# Patient Record
Sex: Male | Born: 1965 | Race: White | Hispanic: No | Marital: Single | Smoking: Current every day smoker
Health system: Southern US, Community
[De-identification: ages and names within clinical notes are randomized; demographics above are authoritative.]

## PROBLEM LIST (undated history)

## (undated) DIAGNOSIS — F329 Major depressive disorder, single episode, unspecified: Secondary | ICD-10-CM

## (undated) DIAGNOSIS — I10 Essential (primary) hypertension: Secondary | ICD-10-CM

## (undated) DIAGNOSIS — K859 Acute pancreatitis without necrosis or infection, unspecified: Secondary | ICD-10-CM

## (undated) DIAGNOSIS — K219 Gastro-esophageal reflux disease without esophagitis: Secondary | ICD-10-CM

## (undated) DIAGNOSIS — F32A Depression, unspecified: Secondary | ICD-10-CM

## (undated) DIAGNOSIS — E46 Unspecified protein-calorie malnutrition: Secondary | ICD-10-CM

## (undated) DIAGNOSIS — I2510 Atherosclerotic heart disease of native coronary artery without angina pectoris: Secondary | ICD-10-CM

## (undated) DIAGNOSIS — R569 Unspecified convulsions: Secondary | ICD-10-CM

## (undated) DIAGNOSIS — G8929 Other chronic pain: Secondary | ICD-10-CM

## (undated) DIAGNOSIS — I469 Cardiac arrest, cause unspecified: Secondary | ICD-10-CM

## (undated) DIAGNOSIS — F209 Schizophrenia, unspecified: Secondary | ICD-10-CM

## (undated) DIAGNOSIS — F1010 Alcohol abuse, uncomplicated: Secondary | ICD-10-CM

---

## 1997-07-12 ENCOUNTER — Emergency Department (HOSPITAL_COMMUNITY): Admission: EM | Admit: 1997-07-12 | Discharge: 1997-07-12 | Payer: Self-pay | Admitting: Emergency Medicine

## 1997-07-15 ENCOUNTER — Emergency Department (HOSPITAL_COMMUNITY): Admission: EM | Admit: 1997-07-15 | Discharge: 1997-07-15 | Payer: Self-pay | Admitting: Emergency Medicine

## 1997-08-20 ENCOUNTER — Encounter: Payer: Self-pay | Admitting: Emergency Medicine

## 1997-08-20 ENCOUNTER — Emergency Department (HOSPITAL_COMMUNITY): Admission: EM | Admit: 1997-08-20 | Discharge: 1997-08-20 | Payer: Self-pay | Admitting: Emergency Medicine

## 1997-11-19 ENCOUNTER — Emergency Department (HOSPITAL_COMMUNITY): Admission: EM | Admit: 1997-11-19 | Discharge: 1997-11-19 | Payer: Self-pay | Admitting: Emergency Medicine

## 1997-12-27 ENCOUNTER — Emergency Department (HOSPITAL_COMMUNITY): Admission: EM | Admit: 1997-12-27 | Discharge: 1997-12-27 | Payer: Self-pay | Admitting: *Deleted

## 2001-03-30 ENCOUNTER — Emergency Department (HOSPITAL_COMMUNITY): Admission: EM | Admit: 2001-03-30 | Discharge: 2001-03-30 | Payer: Self-pay | Admitting: Emergency Medicine

## 2002-02-27 ENCOUNTER — Emergency Department (HOSPITAL_COMMUNITY): Admission: EM | Admit: 2002-02-27 | Discharge: 2002-02-27 | Payer: Self-pay | Admitting: Emergency Medicine

## 2004-02-14 ENCOUNTER — Encounter: Payer: Self-pay | Admitting: Emergency Medicine

## 2004-02-15 ENCOUNTER — Inpatient Hospital Stay (HOSPITAL_COMMUNITY): Admission: EM | Admit: 2004-02-15 | Discharge: 2004-02-22 | Payer: Self-pay | Admitting: Psychiatry

## 2004-02-15 ENCOUNTER — Ambulatory Visit: Payer: Self-pay | Admitting: Psychiatry

## 2004-04-15 ENCOUNTER — Ambulatory Visit: Payer: Self-pay | Admitting: Family Medicine

## 2005-02-01 ENCOUNTER — Ambulatory Visit: Payer: Self-pay | Admitting: Family Medicine

## 2005-03-15 ENCOUNTER — Ambulatory Visit: Payer: Self-pay | Admitting: Family Medicine

## 2005-04-08 ENCOUNTER — Ambulatory Visit: Payer: Self-pay | Admitting: Psychiatry

## 2005-04-08 ENCOUNTER — Inpatient Hospital Stay (HOSPITAL_COMMUNITY): Admission: RE | Admit: 2005-04-08 | Discharge: 2005-04-12 | Payer: Self-pay | Admitting: Psychiatry

## 2005-06-17 ENCOUNTER — Ambulatory Visit: Payer: Self-pay | Admitting: Family Medicine

## 2005-08-24 ENCOUNTER — Ambulatory Visit: Payer: Self-pay | Admitting: Internal Medicine

## 2005-11-02 ENCOUNTER — Ambulatory Visit: Payer: Self-pay | Admitting: Internal Medicine

## 2005-12-13 ENCOUNTER — Emergency Department (HOSPITAL_COMMUNITY): Admission: EM | Admit: 2005-12-13 | Discharge: 2005-12-13 | Payer: Self-pay | Admitting: Emergency Medicine

## 2005-12-21 ENCOUNTER — Ambulatory Visit: Payer: Self-pay | Admitting: Family Medicine

## 2006-04-12 ENCOUNTER — Emergency Department (HOSPITAL_COMMUNITY): Admission: EM | Admit: 2006-04-12 | Discharge: 2006-04-13 | Payer: Self-pay | Admitting: Emergency Medicine

## 2006-04-20 ENCOUNTER — Ambulatory Visit: Payer: Self-pay | Admitting: Family Medicine

## 2006-08-08 ENCOUNTER — Ambulatory Visit: Payer: Self-pay | Admitting: Family Medicine

## 2006-09-09 ENCOUNTER — Inpatient Hospital Stay (HOSPITAL_COMMUNITY): Admission: RE | Admit: 2006-09-09 | Discharge: 2006-09-12 | Payer: Self-pay | Admitting: Psychiatry

## 2006-09-09 ENCOUNTER — Ambulatory Visit: Payer: Self-pay | Admitting: Psychiatry

## 2007-02-23 ENCOUNTER — Emergency Department (HOSPITAL_COMMUNITY): Admission: EM | Admit: 2007-02-23 | Discharge: 2007-02-24 | Payer: Self-pay | Admitting: Emergency Medicine

## 2007-10-08 ENCOUNTER — Emergency Department (HOSPITAL_COMMUNITY): Admission: EM | Admit: 2007-10-08 | Discharge: 2007-10-08 | Payer: Self-pay | Admitting: Emergency Medicine

## 2007-11-10 ENCOUNTER — Emergency Department (HOSPITAL_COMMUNITY): Admission: EM | Admit: 2007-11-10 | Discharge: 2007-11-10 | Payer: Self-pay | Admitting: Emergency Medicine

## 2007-11-19 ENCOUNTER — Emergency Department (HOSPITAL_COMMUNITY): Admission: EM | Admit: 2007-11-19 | Discharge: 2007-11-19 | Payer: Self-pay | Admitting: Emergency Medicine

## 2008-04-25 ENCOUNTER — Emergency Department (HOSPITAL_COMMUNITY): Admission: EM | Admit: 2008-04-25 | Discharge: 2008-04-25 | Payer: Self-pay | Admitting: Emergency Medicine

## 2008-04-25 ENCOUNTER — Encounter (INDEPENDENT_AMBULATORY_CARE_PROVIDER_SITE_OTHER): Payer: Self-pay | Admitting: *Deleted

## 2008-06-03 ENCOUNTER — Emergency Department (HOSPITAL_COMMUNITY): Admission: EM | Admit: 2008-06-03 | Discharge: 2008-06-03 | Payer: Self-pay | Admitting: Emergency Medicine

## 2008-06-05 DIAGNOSIS — F1910 Other psychoactive substance abuse, uncomplicated: Secondary | ICD-10-CM

## 2008-06-05 DIAGNOSIS — I10 Essential (primary) hypertension: Secondary | ICD-10-CM

## 2008-06-05 DIAGNOSIS — F1210 Cannabis abuse, uncomplicated: Secondary | ICD-10-CM | POA: Insufficient documentation

## 2008-06-05 DIAGNOSIS — F1010 Alcohol abuse, uncomplicated: Secondary | ICD-10-CM | POA: Insufficient documentation

## 2008-06-05 DIAGNOSIS — E7800 Pure hypercholesterolemia, unspecified: Secondary | ICD-10-CM | POA: Insufficient documentation

## 2008-06-05 DIAGNOSIS — J40 Bronchitis, not specified as acute or chronic: Secondary | ICD-10-CM

## 2008-06-05 DIAGNOSIS — F329 Major depressive disorder, single episode, unspecified: Secondary | ICD-10-CM

## 2008-06-05 DIAGNOSIS — K625 Hemorrhage of anus and rectum: Secondary | ICD-10-CM

## 2008-06-05 DIAGNOSIS — F200 Paranoid schizophrenia: Secondary | ICD-10-CM | POA: Insufficient documentation

## 2008-06-07 ENCOUNTER — Telehealth: Payer: Self-pay | Admitting: Gastroenterology

## 2008-06-07 ENCOUNTER — Ambulatory Visit: Payer: Self-pay | Admitting: Gastroenterology

## 2008-07-31 ENCOUNTER — Ambulatory Visit: Payer: Self-pay | Admitting: Gastroenterology

## 2008-10-18 ENCOUNTER — Telehealth: Payer: Self-pay | Admitting: Gastroenterology

## 2008-10-18 ENCOUNTER — Inpatient Hospital Stay (HOSPITAL_COMMUNITY): Admission: EM | Admit: 2008-10-18 | Discharge: 2008-10-19 | Payer: Self-pay | Admitting: Emergency Medicine

## 2008-10-19 ENCOUNTER — Telehealth: Payer: Self-pay | Admitting: Gastroenterology

## 2010-04-09 LAB — URINALYSIS, MICROSCOPIC ONLY
Bilirubin Urine: NEGATIVE
Nitrite: NEGATIVE
Protein, ur: NEGATIVE mg/dL
Specific Gravity, Urine: 1.007 (ref 1.005–1.030)
Urobilinogen, UA: 0.2 mg/dL (ref 0.0–1.0)

## 2010-04-09 LAB — CBC
HCT: 40.3 % (ref 39.0–52.0)
HCT: 41.9 % (ref 39.0–52.0)
HCT: 46.5 % (ref 39.0–52.0)
Hemoglobin: 13.9 g/dL (ref 13.0–17.0)
Hemoglobin: 14.5 g/dL (ref 13.0–17.0)
MCHC: 34.5 g/dL (ref 30.0–36.0)
MCHC: 34.7 g/dL (ref 30.0–36.0)
MCV: 94.2 fL (ref 78.0–100.0)
MCV: 93.5 fL (ref 78.0–100.0)
MCV: 94.5 fL (ref 78.0–100.0)
Platelets: 271 10*3/uL (ref 150–400)
Platelets: 293 10*3/uL (ref 150–400)
RBC: 4.28 MIL/uL (ref 4.22–5.81)
RDW: 14.5 % (ref 11.5–15.5)
RDW: 14.7 % (ref 11.5–15.5)
WBC: 6.7 10*3/uL (ref 4.0–10.5)
WBC: 9.9 10*3/uL (ref 4.0–10.5)

## 2010-04-09 LAB — CROSSMATCH: Antibody Screen: NEGATIVE

## 2010-04-09 LAB — BASIC METABOLIC PANEL
BUN: 6 mg/dL (ref 6–23)
CO2: 25 mEq/L (ref 19–32)
Calcium: 8.3 mg/dL — ABNORMAL LOW (ref 8.4–10.5)
Creatinine, Ser: 0.71 mg/dL (ref 0.4–1.5)
GFR calc Af Amer: >60 mL/min (ref >60)

## 2010-04-09 LAB — COMPREHENSIVE METABOLIC PANEL
AST: 25 U/L (ref 0–37)
Albumin: 4 g/dL (ref 3.5–5.2)
BUN: 8 mg/dL (ref 6–23)
Calcium: 9.4 mg/dL (ref 8.4–10.5)
Creatinine, Ser: 0.81 mg/dL (ref 0.4–1.5)
GFR calc Af Amer: >60 mL/min (ref >60)
Total Bilirubin: 0.4 mg/dL (ref 0.3–1.2)
Total Protein: 6.8 g/dL (ref 6.0–8.3)

## 2010-04-09 LAB — URINE CULTURE
Colony Count: NO GROWTH
Culture: NO GROWTH

## 2010-04-09 LAB — PROTIME-INR: Prothrombin Time: 11.8 seconds (ref 11.6–15.2)

## 2010-04-09 LAB — APTT: aPTT: 30 seconds (ref 24–37)

## 2010-04-09 LAB — CARDIAC PANEL(CRET KIN+CKTOT+MB+TROPI): Total CK: 128 U/L (ref 7–232)

## 2010-04-09 LAB — CK TOTAL AND CKMB (NOT AT ARMC): Relative Index: 1.4 (ref 0.0–2.5)

## 2010-04-09 LAB — DIFFERENTIAL
Basophils Absolute: 0.1 10*3/uL (ref 0.0–0.1)
Eosinophils Relative: 3 % (ref 0–5)
Lymphocytes Relative: 22 % (ref 12–46)
Lymphs Abs: 2.2 10*3/uL (ref 0.7–4.0)
Monocytes Absolute: 0.8 10*3/uL (ref 0.1–1.0)
Neutro Abs: 6.6 10*3/uL (ref 1.7–7.7)

## 2010-04-09 LAB — TSH: TSH: 1.671 u[IU]/mL (ref 0.350–4.500)

## 2010-04-09 LAB — TROPONIN I: Troponin I: 0.01 ng/mL (ref 0.00–0.06)

## 2010-04-15 LAB — CBC
HCT: 44.8 % (ref 39.0–52.0)
Hemoglobin: 15.9 g/dL (ref 13.0–17.0)
MCHC: 35.3 g/dL (ref 30.0–36.0)
MCV: 94 fL (ref 78.0–100.0)
RBC: 4.77 MIL/uL (ref 4.22–5.81)
RDW: 13.4 % (ref 11.5–15.5)

## 2010-04-15 LAB — DIFFERENTIAL
Basophils Relative: 0 % (ref 0–1)
Eosinophils Absolute: 0.2 10*3/uL (ref 0.0–0.7)
Eosinophils Relative: 4 % (ref 0–5)
Monocytes Absolute: 0.5 10*3/uL (ref 0.1–1.0)
Monocytes Relative: 8 % (ref 3–12)

## 2010-04-15 LAB — POCT I-STAT, CHEM 8
BUN: 9 mg/dL (ref 6–23)
Creatinine, Ser: 0.9 mg/dL (ref 0.4–1.5)
Glucose, Bld: 106 mg/dL — ABNORMAL HIGH (ref 70–99)
Hemoglobin: 16 g/dL (ref 13.0–17.0)
Potassium: 4.2 mEq/L (ref 3.5–5.1)
TCO2: 27 mmol/L (ref 0–100)

## 2010-04-15 LAB — TYPE AND SCREEN: Antibody Screen: NEGATIVE
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
## 2010-05-22 NOTE — Discharge Summary (Signed)
NAME:  Chase, Blackwell NO.:  [id number]   MEDICAL RECORD NO.:  [id number]          PATIENT TYPE:  IPS   LOCATION:  0407                          FACILITY:  [facility]   PHYSICIAN:  Anselm Jungling, MD  DATE OF BIRTH:  1965-05-01   DATE OF ADMISSION:  09/09/2006  DATE OF DISCHARGE:  09/12/2006                               DISCHARGE SUMMARY   IDENTIFYING DATA AND REASON FOR ADMISSION:  This was a readmission to  the [facility] for Chase Blackwell, a 45 year old single white male who had been  noncompliant with his medications for schizoaffective disorder.  He had  become symptomatic again, resulting in his referral for inpatient  hospitalization.  Please refer to the admission note for further details  pertaining to the symptoms, circumstances and history that led to his  hospitalization.  He was given initial an initial Axis I diagnosis of  schizoaffective disorder NOS.   MEDICAL AND LABORATORY:  The patient was medically and physically  assessed by the psychiatric physician's assistant.  He was continued on  his usual lisinopril, Advair, albuterol, Crestor, and omeprazole.  There  were no acute medical issues.   HOSPITAL COURSE:  The patient was admitted the adult inpatient  psychiatric service.  He presented as a well-nourished, well-developed  adult male who was generally pleasant and cooperative, but with anxious  mood.  He denied suicidal ideation, auditory and visual hallucinations.  He acknowledged having had some auditory hallucinations and some  paranoid feelings.   He was restarted on his usual psychotropic medication regimen which  included Haldol, Cogentin, Paxil, and BuSpar.  He recompensated quite  rapidly.  He tolerated his restarted medications well.  He was initially  evaluated and treated by Dr. Geoffery Lyons.  The undersigned assumed care  of the patient on the final hospital day.  On that day, the patient was  pleasant and calm in our meeting.  He had an odd  mannerism of staring  fixedly, with a wide-eyed appearance, but his speech was rational and  logical.  He acknowledged that he had had auditory hallucinations prior  to admission but indicated that those had completely abated since his  medications had been restarted.  He agreed to [place] contacting his family  regarding discharge planning.  His family was contacted and they were  comfortable with his discharge on that day.  The patient agreed to the  following aftercare plan.   AFTERCARE:  The patient was to follow-up at the [facility] with an  appointment on September 15, 2006.   DISCHARGE MEDICATIONS:  Paxil 20 mg daily, Cogentin 0.5 mg b.i.d.,  Haldol Decanoate injection every 28 days, 75 mg, last injection 8/29,  next injection October 01, 2006, lisinopril 5 mg daily, Advair 1 puff  twice daily, albuterol 2 puffs q. 4-6h p.r.n., Crestor 20 mg daily,  BuSpar 5 mg t.i.d., omeprazole 20 mg b.i.d..  The patient was instructed  to follow-up with his family doctor regarding his general health  problems and medications.   DISCHARGE DIAGNOSES:  AXIS I: Schizoaffective disorder NOS.  AXIS II: Deferred.  AXIS  III: History of hypertension, asthma, GERD.  AXIS IV: Stressors severe.  AXIS V: GAF on discharge 50.      Anselm Jungling, MD  Electronically Signed     SPB/MEDQ  D:  09/13/2006  T:  09/13/2006  Job:  [id number]

## 2010-05-22 NOTE — Discharge Summary (Signed)
NAME:  Chase Blackwell NO.:  [id number]   MEDICAL RECORD NO.:  [id number]          PATIENT TYPE:  IPS   LOCATION:  0405                          FACILITY:  [facility]   PHYSICIAN:  Syed T. Arfeen, M.D.   DATE OF BIRTH:  [birth year]   DATE OF ADMISSION:  02/15/2004  DATE OF DISCHARGE:  02/22/2004                                 DISCHARGE SUMMARY   IDENTIFYING DATA:  This is a voluntary admission of patient Chase Blackwell,  who is a 45 year old white man.  Apparently he was brought to the emergency  room at [facility] by [facility] police department.  It was reported  that he had ingested 5 Tranxene tablets at approximately 4:30 along with a  12-pack of beer that he finished at 6:30, when he walked out of the room and  told his mother that he wanted to commit suicide.  Mother called the police  and the patient was brought to the emergency room.  His urine drug screen  was positive for benzodiazepines and his alcohol level was 187.  He was  admitted voluntarily to the hospital and today he is feeling better.  The  patient desired that he wanted to discharged on his medications.   PAST PSYCHIATRIC HISTORY:  The patient has 2 hospital inpatient admissions  but he is not sure why and where, but at least one of them was at [facility].   ALCOHOL AND DRUG HISTORY:  The patient smokes 2 to 2.5 packs of cigarettes  per day and has stated that he occasionally drinks 2.5 40-ounce beers every  now and then.   PAST MEDICAL HISTORY:  The patient is followed by Dr. Hortencia Pilar and Madaline Savage at [facility].   ADMISSION MEDICATIONS:  He receives Haldol Decanoate and he stated he is due  again on February 17.  He takes Paxil 20 mg 1 tablet daily, Cogentin 1 mg  1/2 daily, and Tranxene 3.75 at 2 p.m.   ALLERGIES:  SULFA, which caused a rash.   PHYSICAL EXAMINATION:  Examination was done in ER essentially within normal  limits.   MENTAL STATUS EXAM:   The patient is alert and oriented x3.  He is somewhat  disheveled, in nightgown.  He maintained limited eye contact.  Speech is  normal rate, rhythm and tone.  He reported his mood to be anxious with  affect euthymic.  Thought processes logical, clear, rational and goal  directed.  He stated he is no longer suicidal and wanted to e discharged.  Judgment and insight limited.  He currently denies any auditory or visual  hallucinations.   ADMISSION DIAGNOSES:   AXIS I:  1.  Schizoaffective disorder, rule out bipolar disorder with psychotic      features.  2.  Rule out schizophrenia.   AXIS II:  Deferred.   AXIS III:  None.   AXIS IV:  Unknown.   AXIS V:  30.   HOSPITAL COURSE:  The patient was admitted and moved to 400 hall.  He was  restarted on his medication.  Paxil was started 20 mg daily. He was  encouraged to participate in individual, group and milieu therapy.  He  continued to be seen as anxious and he reported poor sleep.  The next day he  was more cooperative and he was able to tell that he has been on Haldol and  Cogentin so the Haldol was started as a standing 2 mg p.o. q.h.s. and  Cogentin 0.5 mg q.h.s.  More information was requested from [facility] about his medication.  He was able to get some sleep  but he still seemed very paranoid and reported that he had been  hallucinating prior to coming to the hospital.  Information was collected  from [facility] that the patient was on Haldol Decanoate and the next  scheduled injection is on February 17.  The patient responded a little  better on Haldol, but he continued to be seen paranoid mostly in the evening  time.  His standing Haldol was increased from 2 mg to 3 mg q.h.s.  and on  February 17 he was given Haldol Decanoate 100 mg IM.  The patient started to  show some improvement.  He was seen more verbal, active and social.  His  affect got brighter.  The patient stated that he lives  with his mother and  has been followed by [facility] until he stopped taking  his medications.  He reported a good response with the medications and felt  better and stated that he was ready to go home.  He denied any suicide  ideation, homicidal ideation, or auditory hallucinations.  He reported a  better response with adjustment of medications.  The patient's mother was  contacted who also reported that the patient has been doing very well with  above increased medications.   CONDITION AT DISCHARGE:  Markedly improved,  mood euthymic, affect bright,  thought processes logical,  goal directed, reported no hallucinations or  active or passive suicidal thoughts.  Feeling better on increased medication  and reported no homicidal ideation.  He reported increased social and  increased coping skills.  He felt better on the medication and reported no  acute side effects.  Discharge planning discussed with the patient and th  agreed to be followed up at mental health clinic.   DISCHARGE MEDICATIONS:  1.  The patient was given Haldol Decanoate 100 mg IM on February 17.  2.  Paxil 20 mg daily.  3.  Cogentin 0.5 mg at bedtime.  4.  Protonix 40 mg every day.  5.  Haldol 2 mg 1.5 in the morning and 3 mg at bedtime.   DISPOSITION:  The patient was given an appointment to [facility] on Wednesday February 22 at 1 p.m. by Dr. Lang Snow.   DISCHARGE DIAGNOSES:  Axis I:  Schizoaffective disorder.  Axis II:  Deferred.  Axis III:  Gastroesophageal reflux.  Axis IV:  Moderate.  Axis V:  65.      STA/MEDQ  D:  03/02/2004  T:  03/02/2004  Job:  [id number]

## 2010-05-22 NOTE — Discharge Summary (Signed)
NAME:  Chase Blackwell, Chase Blackwell NO.:  [id number]   MEDICAL RECORD NO.:  [id number]          PATIENT TYPE:  IPS   LOCATION:  0406                          FACILITY:  [facility]   PHYSICIAN:  Anselm Jungling, MD  DATE OF BIRTH:  1965-05-22   DATE OF ADMISSION:  04/08/2005  DATE OF DISCHARGE:  04/12/2005                                 DISCHARGE SUMMARY   IDENTIFYING DATA AND REASON FOR ADMISSION:  The patient is a 45 year old  Caucasian male who lives with his mother and sister in [place].  He has a  history of schizoaffective disorder, and was admitted for alcohol  detoxification and general stabilization.  He came to [place] on a regimen of  Paxil, Haldol Decanoate, and Cogentin.  He had apparently been adherent to  all of his medications but had been drinking heavily.  Please refer to the  admission note for further details pertaining to the symptoms, circumstances  and history that led to his hospitalization.  He was given an initial Axis I  diagnosis of schizoaffective disorder, and alcohol abuse, and impending  alcohol withdrawal.  His usual psychiatrist is Dr. Hortencia Pilar at [name].  This is his second [facility]  admission.   MEDICAL AND LABORATORY:  The patient was medically and physically assessed  by the psychiatric nurse practitioner upon admission.  He has a history of  hypertension and came to [place] on a regimen of lisinopril 5 mg daily and  Crestor 20 mg daily.  These were continued.  In addition, he had a history  of GERD and was given Protonix 40 mg daily for this.  Otherwise, there were  no significant medical issues.   HOSPITAL COURSE:  The patient was admitted to the adult inpatient  psychiatric service.  He was placed on a Librium withdrawal protocol to  address alcohol detoxification.  He presented as a large, moderately obese,  disheveled gentleman who immediately told me that he wanted to go home and  reassured me that this  would be alright with his mother.  He told me that he  came in only because he had been a little depressed but denied suicidal  ideation.  He showed affective blunting and anxious mood.  His concrete  thinking style was not characterized by overtly delusional statements.  He  denied auditory hallucinations.  His insight and judgment appeared to be  limited.  After some discussion with the patient, he agreed to stay for  treatment.   The patient was continued on his usual psychotropic medications as described  above.  He was not due for another Haldol Decanoate shot until April 13.  Given that he was not overtly psychotic and did not appear to be this be  destabilized with respect schizoaffective disorder, his medication regimen  was not altered.  Trazodone 50 to 100 mg at bedtime was given on an as-  needed basis for insomnia.   The patient progressed well with respect to detoxification and by the fourth  hospital day appeared to be beyond any further alcohol withdrawal symptoms  and ready for discharge.  His family was contacted prior to his release,  which they indicated they were comfortable with.   AFTERCARE:  The patient is to follow-up at the [facility] on April 16, 2005 where he will receive his next Haldol Decanoate shot of 100 mg.  Other  medications instructed at the time of discharge include Cogentin 0.5 mg  q.h.s., Paxil 20 mg daily, trazodone 50 to 100 mg h.s. p.r.n. insomnia,  Crestor 20 mg daily, lisinopril 5 mg daily and Protonix 40 mg daily.   DISCHARGE DIAGNOSES:  AXIS I:  1.  Schizoaffective disorder  2.  Alcohol abuse.  AXIS II:  Deferred.  AXIS III:  1.  History of hypertension.  2.  Gastroesophageal reflux disease.  AXIS IV:  Stressors moderate.  AXIS V:  GAF on discharge 60.           ______________________________  Anselm Jungling, MD  Electronically Signed     SPB/MEDQ  D:  04/13/2005  T:  04/14/2005  Job:  [id number]

## 2010-05-22 NOTE — H&P (Signed)
NAME:  Chase Blackwell, Chase Blackwell NO.:  [id number]   MEDICAL RECORD NO.:  [id number]          PATIENT TYPE:  IPS   LOCATION:  0405                          FACILITY:  [facility]   PHYSICIAN:  Jeanice Lim, M.D. DATE OF BIRTH:  [birth year]   DATE OF ADMISSION:  02/15/2004  DATE OF DISCHARGE:                         PSYCHIATRIC ADMISSION ASSESSMENT   IDENTIFYING INFORMATION:  This is a voluntary admission to the services of  Dr. Aleatha Borer.  This is a 45 year old single white male.  Apparently he  was brought to the emergency room at [facility] by [facility]  police department.  They reported that he had ingested 5 Tranxene tabs at  approximately 1630, along with a 12-pack of beer that he finished at 1830.  Then he walked out of the room and told his mother that he wanted to commit  suicide.  She called the police and they brought him to the emergency room.  He was cleared.  His urine drug screen showed positive for benzodiazepines  as would be expected, taking five 3.75 mg Tranxene tablets.  Otherwise it  was negative, and his alcohol level was 187.  He was admitted voluntarily to  the hospital.  Today, he is feeling better.  He is being detoxed and there  is no evidence that he is having any problems, and he will be restarted on  his medications.   PAST PSYCHIATRIC HISTORY:  He has had 2 inpatient stays, he is not exactly  sure when or where, but one at least was at [facility].   SOCIAL HISTORY:  He states that he graduated high school, used to work for  the city of [employer].  He mixed mortar.  He never married.  He has no  children.  He currently lives with his parents.   FAMILY HISTORY:  He states he has a sister who hears voices.   ALCOHOL AND DRUG ABUSE:  He smokes 2 to 2.5 packs of cigarettes  per day.  He states that occasionally he drinks 2.5 40-ounces every now and then.  Obviously yesterday was one of those now and then.   PAST MEDICAL  HISTORY:  Primary care Tekeshia Klahr is [facility].  He is followed  psychiatrically by De Nurse, M.D. and Madaline Savage, R.N. at [facility].  He receives Haldol Decanoate.  He states he is due  again February 17.  He takes Paxil 20 mg 1 p.o. daily, Cogentin 1 mg 1/2  daily, and Tranxene 3.75 1 p.o. at 2 p.m.   ALLERGIES:  SULFA, he has a rash.   POSITIVE PHYSICAL FINDINGS:  PHYSICAL EXAMINATION:  As per the ER.  It was  not repeated.   MENTAL STATUS EXAM:  H is alert and oriented x3.  He is somewhat disheveled.  He is in a nightgown.  Gait and motor are normal.  He had very poor eye  contact.  His speech is normal rate, rhythm and tone.  His mood he states is  good.  His affect is euthymic.  Thought processes are clear, rational, and  goal oriented.  He states he is no longer suicidal.  He requests discharge.  Judgment and insight are poor, concentration and memory are intact.  Intelligence is average to below.  He currently denies any auditory or  visual hallucinations.  He states he uses a  puffer and some other medications.  I tried to call [facility] at [phone].  They are not answering and there is nobody answering at his home  phone.  I will try to track that down.  Apparently he was just having issues  due to his alcohol intake and Tranxene intake yesterday.      MD/MEDQ  D:  02/15/2004  T:  02/15/2004  Job:  [id number]

## 2010-09-25 LAB — COMPREHENSIVE METABOLIC PANEL
ALT: 20
CO2: 25
Calcium: 9.1
Chloride: 98
GFR calc non Af Amer: >60
Glucose, Bld: 98
Sodium: 132 — ABNORMAL LOW
Total Bilirubin: 0.7

## 2010-09-25 LAB — LIPASE, BLOOD: Lipase: 24

## 2010-09-25 LAB — POCT CARDIAC MARKERS
CKMB, poc: 1.9
Myoglobin, poc: 107
Myoglobin, poc: 112
Troponin i, poc: <0.05

## 2010-10-06 LAB — OCCULT BLOOD X 1 CARD TO LAB, STOOL
Fecal Occult Bld: NEGATIVE
Fecal Occult Bld: POSITIVE

## 2010-10-06 LAB — URINALYSIS, ROUTINE W REFLEX MICROSCOPIC
Glucose, UA: NEGATIVE
Ketones, ur: NEGATIVE
Nitrite: NEGATIVE
Protein, ur: NEGATIVE
Urobilinogen, UA: 0.2

## 2010-10-06 LAB — CBC
Platelets: 345
RBC: 4.75
WBC: 7.2

## 2010-10-06 LAB — BASIC METABOLIC PANEL
BUN: 6
Calcium: 9.8
Creatinine, Ser: 0.74
GFR calc Af Amer: >60

## 2010-10-06 LAB — DIFFERENTIAL
Basophils Relative: 1
Eosinophils Absolute: 0.1
Lymphs Abs: 2.1
Monocytes Relative: 8
Neutro Abs: 4.4
Neutrophils Relative %: 61

## 2010-10-16 LAB — COMPREHENSIVE METABOLIC PANEL
ALT: 16
Albumin: 3.9
Alkaline Phosphatase: 51
BUN: 9
Chloride: 103
Potassium: 3.8
Sodium: 139
Total Bilirubin: 0.7
Total Protein: 6.3

## 2010-10-16 LAB — CBC
HCT: 41.1
Hemoglobin: 14.2
Platelets: 266
RDW: 12.6
WBC: 7.7

## 2011-05-09 ENCOUNTER — Observation Stay (HOSPITAL_COMMUNITY)
Admission: EM | Admit: 2011-05-09 | Discharge: 2011-05-09 | Disposition: A | Discharge status: Home / Self Care | Payer: Medicare Other | Source: Ambulatory Visit | Attending: Internal Medicine | Admitting: Internal Medicine

## 2011-05-09 ENCOUNTER — Emergency Department (HOSPITAL_COMMUNITY): Payer: Medicare Other

## 2011-05-09 ENCOUNTER — Encounter (HOSPITAL_COMMUNITY): Payer: Self-pay | Admitting: *Deleted

## 2011-05-09 DIAGNOSIS — R569 Unspecified convulsions: Principal | ICD-10-CM | POA: Insufficient documentation

## 2011-05-09 DIAGNOSIS — F1210 Cannabis abuse, uncomplicated: Secondary | ICD-10-CM | POA: Insufficient documentation

## 2011-05-09 DIAGNOSIS — F05 Delirium due to known physiological condition: Secondary | ICD-10-CM

## 2011-05-09 HISTORY — DX: Gastro-esophageal reflux disease without esophagitis: K21.9

## 2011-05-09 HISTORY — DX: Essential (primary) hypertension: I10

## 2011-05-09 HISTORY — DX: Other chronic pain: G89.29

## 2011-05-09 LAB — POCT I-STAT, CHEM 8
BUN: 7 mg/dL (ref 6–23)
Calcium, Ion: 1.17 mmol/L (ref 1.12–1.32)
Chloride: 103 mEq/L (ref 96–112)
Potassium: 3.7 mEq/L (ref 3.5–5.1)
Sodium: 140 mEq/L (ref 135–145)

## 2011-05-09 LAB — RAPID URINE DRUG SCREEN, HOSP PERFORMED
Amphetamines: NOT DETECTED
Benzodiazepines: NOT DETECTED
Opiates: NOT DETECTED

## 2011-05-09 LAB — CBC
HCT: 40.2 % (ref 39.0–52.0)
Hemoglobin: 14.2 g/dL (ref 13.0–17.0)
MCH: 32.4 pg (ref 26.0–34.0)
MCHC: 35.3 g/dL (ref 30.0–36.0)
MCV: 91.8 fL (ref 78.0–100.0)
RDW: 13.3 % (ref 11.5–15.5)

## 2011-05-09 LAB — DIFFERENTIAL
Basophils Absolute: 0 10*3/uL (ref 0.0–0.1)
Basophils Relative: 1 % (ref 0–1)
Eosinophils Absolute: 0.3 10*3/uL (ref 0.0–0.7)
Eosinophils Relative: 4 % (ref 0–5)
Monocytes Absolute: 1.2 10*3/uL — ABNORMAL HIGH (ref 0.1–1.0)
Monocytes Relative: 14 % — ABNORMAL HIGH (ref 3–12)

## 2011-05-09 LAB — BASIC METABOLIC PANEL
BUN: 9 mg/dL (ref 6–23)
Chloride: 99 mEq/L (ref 96–112)
GFR calc Af Amer: >90 mL/min (ref >90)
Potassium: 3.5 mEq/L (ref 3.5–5.1)

## 2011-05-09 LAB — URINALYSIS, ROUTINE W REFLEX MICROSCOPIC
Glucose, UA: NEGATIVE mg/dL
Leukocytes, UA: NEGATIVE
Protein, ur: NEGATIVE mg/dL
pH: 6.5 (ref 5.0–8.0)

## 2011-05-09 LAB — PROTIME-INR: INR: 0.94 (ref 0.00–1.49)

## 2011-05-09 MED ORDER — LORAZEPAM 1 MG PO TABS
1.0000 mg | ORAL_TABLET | ORAL | Status: DC | PRN
  Administered 2011-05-09: 1 mg via ORAL
  Filled 2011-05-09: qty 1

## 2011-05-09 MED ORDER — SODIUM CHLORIDE 0.9 % IV SOLN: INTRAVENOUS | Status: DC

## 2011-05-09 MED ORDER — LORAZEPAM 2 MG/ML IJ SOLN
1.0000 mg | Freq: Once | INTRAMUSCULAR | Status: AC
  Administered 2011-05-09: 1 mg via INTRAVENOUS
  Filled 2011-05-09: qty 1

## 2011-05-09 NOTE — Progress Notes (Signed)
05/09/11 0850  OTHER  CSW Follow Up Status Follow-up required

## 2011-05-09 NOTE — ED Notes (Signed)
Per family member with patient, he complained of a severe headache and took 8 tramadol tablets prior to seizure- Denies prior seizures, takes tramadol for pain

## 2011-05-09 NOTE — ED Notes (Signed)
Attempted to call report but receiving RN is doing patient care at present.

## 2011-05-09 NOTE — ED Notes (Signed)
Patient transported to CT 

## 2011-05-09 NOTE — ED Notes (Signed)
Report received, assumed care.  

## 2011-05-09 NOTE — ED Notes (Signed)
Pt brother unable to get pick pt up. Pt given a bus pass.

## 2011-05-09 NOTE — ED Notes (Addendum)
Pt requested urinal, rn went into room to give pt urinal and pt came out of the bed, pulling off the monitoring, oxygen,  and iv fluids.  Floor cleaned and pt put back to bed. vss at this time

## 2011-05-09 NOTE — Progress Notes (Signed)
05/09/11 0849  Discharge Planning  Type of Residence Private residence  Living Arrangements Other relatives  Home Care Services No  Support Systems Other relatives  Do you have any problems obtaining your medications? No  Family/patient expects to be discharged to: Private residence  Once you are discharged, how will you get to your follow-up appointment? Family  Expected Discharge Date 05/12/11  Case Management Consult Needed No  Social Work Consult Needed Yes (Comment)     Unit based LCSW to be consulted for SA assessment.
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
## 2011-05-09 NOTE — ED Notes (Signed)
Pt claimed that he is getting nervous, anxious. Pt  Is medicated with Ativan as ordered.

## 2011-05-09 NOTE — ED Provider Notes (Signed)
History     CSN: [id number]  Arrival date & time 05/09/11  0540   First MD Initiated Contact with Patient 05/09/11 [phone]      Chief Complaint  Patient presents with  . Seizures    (Consider location/radiation/quality/duration/timing/severity/associated sxs/prior treatment) HPI  Patient presents to emergency department by EMS for report of seizure by his sister. Patient's sister is at bedside and states that she was sleeping on the couch and heard a loud "tud." She states that she went in and found her brother on the floor "shaking all over." The sister reports that it looked "like he was having a seizure" and goes on to say that he did have urinary incontinence. EMS reports that patient appeared to be post ictal on transport but that he would respond to painful stimuli. Patient's sats were 80% on room air in route and on arrival. Sister states that she's not aware of any seizure history in patient. She states that her brother was "up all night." When asked why she states "I'm not sure but I know he had a really bad headache and he took I think 8 tramadol, the 50 mg ones." She goes on to say that patient had been smoking pot that evening but denies any known alcohol use. She states that when her brother drinks, he drinks approximately three 40 ounce beers at a time. She states "he'll do this whenever he gets money to do it, maybe once a week or once every other week." She states that he does have a "mental problems" stating that he is on both Haldol injections, Haldol tablets, and "other psych meds". She states that his primary care provider is Licensed conveyancer. A level 5 caveat applies due to the patient's postictal, altered mental status. He remains arousable to voice but then quickly falls back to sleep.  No past medical history on file.  No past surgical history on file.  No family history on file.  History  Substance Use Topics  . Smoking status: Not on file  . Smokeless tobacco:  Not on file  . Alcohol Use: Not on file      Review of Systems  Unable to perform ROS   Allergies  Cetirizine & related; Hydroxyzine; and Sulfonamide derivatives  Home Medications   Current Outpatient Rx  Name Route Sig Dispense Refill  . ALBUTEROL SULFATE HFA 108 (90 BASE) MCG/ACT IN AERS Inhalation Inhale 2 puffs into the lungs every 6 (six) hours as needed. Wheezing or shortness of breath    . BENZTROPINE MESYLATE 0.5 MG PO TABS Oral Take 0.5 mg by mouth 2 (two) times daily.    . CYCLOBENZAPRINE HCL 10 MG PO TABS Oral Take 10 mg by mouth daily as needed. spasms    . FLUTICASONE-SALMETEROL 250-50 MCG/DOSE IN AEPB Inhalation Inhale 1 puff into the lungs every 12 (twelve) hours.    Marland Kitchen GABAPENTIN 300 MG PO CAPS Oral Take 300 mg by mouth 3 (three) times daily.    Marland Kitchen HALOPERIDOL DECANOATE 100 MG/ML IM SOLN Intramuscular Inject 100 mg into the muscle every 28 (twenty-eight) days.    Marland Kitchen HYDROCHLOROTHIAZIDE 25 MG PO TABS Oral Take 25 mg by mouth daily.    Marland Kitchen LISINOPRIL 10 MG PO TABS Oral Take 10 mg by mouth daily.    Marland Kitchen NAPROXEN 500 MG PO TABS Oral Take 500 mg by mouth 2 (two) times daily with a meal.    . NIACIN ER (ANTIHYPERLIPIDEMIC) 500 MG PO TBCR Oral Take 500 mg  by mouth at bedtime.    . OMEPRAZOLE 20 MG PO CPDR Oral Take 20 mg by mouth daily.    Marland Kitchen PAROXETINE HCL 40 MG PO TABS Oral Take 40 mg by mouth every morning.    Marland Kitchen POTASSIUM CHLORIDE CRYS ER 10 MEQ PO TBCR Oral Take 10 mEq by mouth daily.    Marland Kitchen ROSUVASTATIN CALCIUM 20 MG PO TABS Oral Take 20 mg by mouth daily.    . TRAMADOL HCL 50 MG PO TABS Oral Take 50-100 mg by mouth every 6 (six) hours as needed. For pain No more than 6 per day    . TRAZODONE HCL 100 MG PO TABS Oral Take 100 mg by mouth at bedtime.      BP 137/90  Pulse 105  Temp 97.9 F (36.6 C)  Resp 29  SpO2 100%  Physical Exam  Nursing note and vitals reviewed. Constitutional: He appears well-developed and well-nourished. No distress.  HENT:  Head: Normocephalic  and atraumatic.       Patent airway, swallowing secretions well.   Eyes: Conjunctivae and EOM are normal. Pupils are equal, round, and reactive to light.  Neck: Normal range of motion. Neck supple.  Cardiovascular: Normal rate, regular rhythm, normal heart sounds and intact distal pulses.  Exam reveals no gallop and no friction rub.   No murmur heard. Pulmonary/Chest: Effort normal and breath sounds normal. No respiratory distress. He has no wheezes. He has no rales. He exhibits no tenderness.  Abdominal: Soft. Bowel sounds are normal. He exhibits no distension and no mass. There is no tenderness. There is no rebound and no guarding.  Musculoskeletal: He exhibits no edema and no tenderness.  Neurological:       arousable to painful stimuli will mutter one word response and fall back to sleep  Skin: Skin is warm and dry. No rash noted. He is not diaphoretic. No erythema.  Psychiatric: He has a normal mood and affect.    ED Course  Procedures (including critical care time)  IV ativan  Labs Reviewed  DIFFERENTIAL - Abnormal; Notable for the following:    Monocytes Relative 14 (*)    Monocytes Absolute 1.2 (*)    All other components within normal limits  URINALYSIS, ROUTINE W REFLEX MICROSCOPIC - Abnormal; Notable for the following:    Color, Urine STRAW (*)    All other components within normal limits  URINE RAPID DRUG SCREEN (HOSP PERFORMED) - Abnormal; Notable for the following:    Tetrahydrocannabinol POSITIVE (*)    All other components within normal limits  CBC  ETHANOL  PROTIME-INR  APTT  BASIC METABOLIC PANEL  POCT I-STAT, CHEM 8   Ct Head Wo Contrast  05/09/2011  *RADIOLOGY REPORT*  Clinical Data: Headache, seizure, fall  CT HEAD WITHOUT CONTRAST  Technique:  Contiguous axial images were obtained from the base of the skull through the vertex without contrast.  Comparison: February 14, 2004  Findings: The ventricles are normal in size, shape, and position. There is no mass  effect or midline shift.  No acute hemorrhage or abnormal extra-axial fluid collections are identified.  The gray/white differentiation is normal.  The orbits, calvarium, visualized paranasal sinuses have a normal appearance.  IMPRESSION: Normal CT scan of the head with no evidence of acute intracranial abnormality.  Original Report Authenticated By: Brandon Melnick, M.D.   Dg Chest Port 1 View  05/09/2011  *RADIOLOGY REPORT*  Clinical Data: Shortness of breath; patient unresponsive.  PORTABLE CHEST - 1 VIEW  Comparison: Chest radiograph performed 02/23/2007  Findings: The lungs are well-aerated.  Vascular congestion is noted, with mildly increased interstitial markings, raising question for mild interstitial edema.  There is no evidence of pleural effusion or pneumothorax.  The cardiomediastinal silhouette is borderline normal in size.  No acute osseous abnormalities are seen.  IMPRESSION: Vascular congestion, with mildly increased interstitial markings, raising question for mild interstitial edema.  Original Report Authenticated By: Tonia Ghent, M.D.     1. Seizure   2. CANNABIS ABUSE   3. Post-ictal confusion       MDM  Due to patient remaining post ictal/confused with question of alcohol withdrawal seizure vs seizure secondary to tramadol use, patient will be admitted to obs to Dr. Radonna Ricker with [facility]. VSS. Will continue to monitor in ER.   Temp admission orders written        Jenness Corner, PA 05/09/11 1610  Dr. Radonna Ricker presents to the EP office stating that he is evaluated the patient he is alert and oriented in no longer postictal. He reports that patient is stating that he does not want to be admitted to the hospital. I went to speak with the patient and Dr. Radonna Ricker is correct. Patient is no longer postictal. He is alert and oriented. He is ambulating without difficulty. Has no neuro focal findings. He is mentating well. Patient states that he does not want to be admitted to the  hospital, rather he will followup with his primary care provider and a neurologist in near future. Patient states he does not have a car and does not drive. He is agreeable to no longer using tramadol, stopping any pot or any other illicit drug use. Patient states that he drinks approximately once to twice a week and when he does he drinks approximately a 6 pack worth of beer. Patient states he last drank 2 days ago. Patient states he has no history of alcohol withdrawal seizures and that he has gone weeks at a time without alcohol without difficulty. Now that patient is no longer postictal, alert and oriented, ambulating without any neuro focal findings we will allow him to be discharged from the ER without any further workup in an admission. Patient is requesting discharge home. We spoke at length about the absolute need for close neurology followup. Patient voices his understanding and is agreeable to plan.  Jenness Corner, [place] 05/09/11 1114

## 2011-05-09 NOTE — ED Notes (Signed)
Pt family heard something and saw pt on the floor, shaking all over, appearing to be grand ma seizure. The patient has no history of seizure. He was post ictal on arrival by ems, responding to painful stimuli.  Incontinent of urine. Original sats 88%, no improved on NRB. Pt immobilized by EMS.

## 2011-05-09 NOTE — ED Notes (Signed)
Pt is waiting for his brother to pick him up.

## 2011-05-09 NOTE — ED Notes (Addendum)
Pt claimed that he is feeling well and is wanting to go home. Patient was informed that Internal Medicine is coming to see him. Sister is at the bedside

## 2011-05-09 NOTE — ED Notes (Signed)
Dr. David Stall is at the bedside

## 2011-05-09 NOTE — ED Notes (Signed)
Report to Jenny, RN

## 2011-05-09 NOTE — ED Notes (Addendum)
Informed patient and/or family of status. Seizure pads remain in place, no seizure activity noted. Continues to be drowsy, sleeping. Arouses to loud voice & physical contact but quick to fall back asleep again.  Pt sister reports pt took 8 tramadol at once last night, pt denies. Pt unaware of what brought him to ED. Able to answer simple questions appropriately.

## 2011-05-10 NOTE — ED Provider Notes (Signed)
Medical screening examination/treatment/procedure(s) were conducted as a shared visit with non-physician practitioner(s) and myself.  I personally evaluated the patient during the encounter  Davaris Youtsey M Lj Miyamoto, MD 05/10/11 0345 

## 2011-05-10 NOTE — ED Provider Notes (Signed)
Medical screening examination/treatment/procedure(s) were conducted as a shared visit with non-physician practitioner(s) and myself.  I personally evaluated the patient during the encounter  Olivia Mackie, MD 05/10/11 [phone]

## 2012-04-03 ENCOUNTER — Emergency Department (HOSPITAL_COMMUNITY): Payer: Medicare Other

## 2012-04-03 ENCOUNTER — Emergency Department (HOSPITAL_COMMUNITY)
Admission: EM | Admit: 2012-04-03 | Discharge: 2012-04-03 | Disposition: A | Discharge status: Home / Self Care | Payer: Medicare Other | Attending: Emergency Medicine | Admitting: Emergency Medicine

## 2012-04-03 ENCOUNTER — Encounter (HOSPITAL_COMMUNITY): Payer: Self-pay | Admitting: *Deleted

## 2012-04-03 DIAGNOSIS — F209 Schizophrenia, unspecified: Secondary | ICD-10-CM | POA: Insufficient documentation

## 2012-04-03 DIAGNOSIS — F17200 Nicotine dependence, unspecified, uncomplicated: Secondary | ICD-10-CM | POA: Insufficient documentation

## 2012-04-03 DIAGNOSIS — Y92410 Unspecified street and highway as the place of occurrence of the external cause: Secondary | ICD-10-CM | POA: Insufficient documentation

## 2012-04-03 DIAGNOSIS — Z79899 Other long term (current) drug therapy: Secondary | ICD-10-CM | POA: Insufficient documentation

## 2012-04-03 DIAGNOSIS — J45909 Unspecified asthma, uncomplicated: Secondary | ICD-10-CM | POA: Insufficient documentation

## 2012-04-03 DIAGNOSIS — IMO0002 Reserved for concepts with insufficient information to code with codable children: Secondary | ICD-10-CM | POA: Insufficient documentation

## 2012-04-03 DIAGNOSIS — Y9301 Activity, walking, marching and hiking: Secondary | ICD-10-CM | POA: Insufficient documentation

## 2012-04-03 DIAGNOSIS — G8929 Other chronic pain: Secondary | ICD-10-CM | POA: Insufficient documentation

## 2012-04-03 DIAGNOSIS — K219 Gastro-esophageal reflux disease without esophagitis: Secondary | ICD-10-CM | POA: Insufficient documentation

## 2012-04-03 DIAGNOSIS — I10 Essential (primary) hypertension: Secondary | ICD-10-CM | POA: Insufficient documentation

## 2012-04-03 HISTORY — DX: Schizophrenia, unspecified: F20.9

## 2012-04-03 MED ORDER — HYDROCODONE-ACETAMINOPHEN 5-325 MG PO TABS: 2.0000 | ORAL_TABLET | Freq: Four times a day (QID) | ORAL | Status: DC | PRN

## 2012-04-03 NOTE — ED Provider Notes (Signed)
Medical screening examination/treatment/procedure(s) were performed by non-physician practitioner and as supervising physician I was immediately available for consultation/collaboration.  Martell Mcfadyen, MD 04/03/12 1724 
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
## 2012-05-12 ENCOUNTER — Other Ambulatory Visit: Payer: Self-pay

## 2012-05-12 ENCOUNTER — Encounter (HOSPITAL_COMMUNITY): Payer: Self-pay | Admitting: Emergency Medicine

## 2012-05-12 ENCOUNTER — Emergency Department (HOSPITAL_COMMUNITY)
Admission: EM | Admit: 2012-05-12 | Discharge: 2012-05-12 | Disposition: A | Discharge status: Home / Self Care | Payer: Medicare Other | Attending: Emergency Medicine | Admitting: Emergency Medicine

## 2012-05-12 DIAGNOSIS — Z79899 Other long term (current) drug therapy: Secondary | ICD-10-CM | POA: Insufficient documentation

## 2012-05-12 DIAGNOSIS — K219 Gastro-esophageal reflux disease without esophagitis: Secondary | ICD-10-CM | POA: Insufficient documentation

## 2012-05-12 DIAGNOSIS — J45909 Unspecified asthma, uncomplicated: Secondary | ICD-10-CM | POA: Insufficient documentation

## 2012-05-12 DIAGNOSIS — R197 Diarrhea, unspecified: Secondary | ICD-10-CM | POA: Insufficient documentation

## 2012-05-12 DIAGNOSIS — F209 Schizophrenia, unspecified: Secondary | ICD-10-CM | POA: Insufficient documentation

## 2012-05-12 DIAGNOSIS — F17200 Nicotine dependence, unspecified, uncomplicated: Secondary | ICD-10-CM | POA: Insufficient documentation

## 2012-05-12 DIAGNOSIS — G8929 Other chronic pain: Secondary | ICD-10-CM | POA: Insufficient documentation

## 2012-05-12 DIAGNOSIS — I10 Essential (primary) hypertension: Secondary | ICD-10-CM | POA: Insufficient documentation

## 2012-05-12 DIAGNOSIS — IMO0001 Reserved for inherently not codable concepts without codable children: Secondary | ICD-10-CM | POA: Insufficient documentation

## 2012-05-12 DIAGNOSIS — R5381 Other malaise: Secondary | ICD-10-CM | POA: Insufficient documentation

## 2012-05-12 LAB — URINALYSIS, ROUTINE W REFLEX MICROSCOPIC
Glucose, UA: NEGATIVE mg/dL
Leukocytes, UA: NEGATIVE
Specific Gravity, Urine: 1.007 (ref 1.005–1.030)
pH: 6 (ref 5.0–8.0)

## 2012-05-12 LAB — COMPREHENSIVE METABOLIC PANEL
Albumin: 4.3 g/dL (ref 3.5–5.2)
BUN: 7 mg/dL (ref 6–23)
Creatinine, Ser: 0.75 mg/dL (ref 0.50–1.35)
Potassium: 3.2 mEq/L — ABNORMAL LOW (ref 3.5–5.1)
Total Protein: 7.6 g/dL (ref 6.0–8.3)

## 2012-05-12 LAB — CBC WITH DIFFERENTIAL/PLATELET
Eosinophils Relative: 1 % (ref 0–5)
HCT: 48.4 % (ref 39.0–52.0)
Hemoglobin: 17.8 g/dL — ABNORMAL HIGH (ref 13.0–17.0)
Lymphocytes Relative: 32 % (ref 12–46)
Lymphs Abs: 3.2 10*3/uL (ref 0.7–4.0)
MCV: 90.5 fL (ref 78.0–100.0)
Monocytes Absolute: 0.5 10*3/uL (ref 0.1–1.0)
Monocytes Relative: 5 % (ref 3–12)
RBC: 5.35 MIL/uL (ref 4.22–5.81)
WBC: 10.1 10*3/uL (ref 4.0–10.5)

## 2012-05-12 MED ORDER — LORAZEPAM 1 MG PO TABS
0.5000 mg | ORAL_TABLET | Freq: Once | ORAL | Status: AC
  Administered 2012-05-12: 0.5 mg via ORAL
  Filled 2012-05-12: qty 1

## 2012-05-12 MED ORDER — SODIUM CHLORIDE 0.9 % IV BOLUS (SEPSIS)
1000.0000 mL | Freq: Once | INTRAVENOUS | Status: AC
  Administered 2012-05-12: 1000 mL via INTRAVENOUS

## 2012-05-12 MED ORDER — POTASSIUM CHLORIDE CRYS ER 20 MEQ PO TBCR
20.0000 meq | EXTENDED_RELEASE_TABLET | Freq: Once | ORAL | Status: AC
  Administered 2012-05-12: 20 meq via ORAL
  Filled 2012-05-12: qty 1

## 2012-05-12 MED ORDER — VITAMIN B-1 100 MG PO TABS
50.0000 mg | ORAL_TABLET | Freq: Once | ORAL | Status: AC
  Administered 2012-05-12: 50 mg via ORAL
  Filled 2012-05-12: qty 2

## 2012-05-12 NOTE — ED Notes (Signed)
Pt here c/o generalized body aches and diarrhea starting today; pt noted to be yawning excessively and admits to being out of vicodin  X 3 days; pt sts thinks he could be withdrawing when asked
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
## 2013-02-01 ENCOUNTER — Emergency Department (HOSPITAL_COMMUNITY): Payer: Medicare Other

## 2013-02-01 ENCOUNTER — Emergency Department (HOSPITAL_COMMUNITY)
Admission: EM | Admit: 2013-02-01 | Discharge: 2013-02-01 | Disposition: A | Discharge status: Home / Self Care | Payer: Medicare Other | Attending: Emergency Medicine | Admitting: Emergency Medicine

## 2013-02-01 ENCOUNTER — Encounter (HOSPITAL_COMMUNITY): Payer: Self-pay | Admitting: Emergency Medicine

## 2013-02-01 DIAGNOSIS — I10 Essential (primary) hypertension: Secondary | ICD-10-CM | POA: Insufficient documentation

## 2013-02-01 DIAGNOSIS — J45901 Unspecified asthma with (acute) exacerbation: Secondary | ICD-10-CM | POA: Insufficient documentation

## 2013-02-01 DIAGNOSIS — R0789 Other chest pain: Secondary | ICD-10-CM

## 2013-02-01 DIAGNOSIS — F209 Schizophrenia, unspecified: Secondary | ICD-10-CM | POA: Insufficient documentation

## 2013-02-01 DIAGNOSIS — R071 Chest pain on breathing: Secondary | ICD-10-CM | POA: Insufficient documentation

## 2013-02-01 DIAGNOSIS — Z79899 Other long term (current) drug therapy: Secondary | ICD-10-CM | POA: Insufficient documentation

## 2013-02-01 DIAGNOSIS — F17200 Nicotine dependence, unspecified, uncomplicated: Secondary | ICD-10-CM | POA: Insufficient documentation

## 2013-02-01 DIAGNOSIS — G8929 Other chronic pain: Secondary | ICD-10-CM | POA: Insufficient documentation

## 2013-02-01 DIAGNOSIS — K219 Gastro-esophageal reflux disease without esophagitis: Secondary | ICD-10-CM | POA: Insufficient documentation

## 2013-02-01 LAB — CBC
HCT: 36.9 % — ABNORMAL LOW (ref 39.0–52.0)
Hemoglobin: 13.5 g/dL (ref 13.0–17.0)
MCH: 33.3 pg (ref 26.0–34.0)
MCHC: 37.3 g/dL — AB (ref 30.0–36.0)
MCV: 89.3 fL (ref 78.0–100.0)
PLATELETS: 288 10*3/uL (ref 150–400)
RBC: 4.13 MIL/uL — AB (ref 4.22–5.81)
RDW: 13.3 % (ref 11.5–15.5)
WBC: 5.7 10*3/uL (ref 4.0–10.5)

## 2013-02-01 LAB — PRO B NATRIURETIC PEPTIDE: PRO B NATRI PEPTIDE: 192.7 pg/mL — AB (ref 0–125)

## 2013-02-01 LAB — BASIC METABOLIC PANEL
BUN: 8 mg/dL (ref 6–23)
CALCIUM: 8.8 mg/dL (ref 8.4–10.5)
CO2: 27 meq/L (ref 19–32)
CREATININE: 0.73 mg/dL (ref 0.50–1.35)
Chloride: 89 mEq/L — ABNORMAL LOW (ref 96–112)
GFR calc Af Amer: >90 mL/min (ref >90)
Glucose, Bld: 92 mg/dL (ref 70–99)
Potassium: 4.1 mEq/L (ref 3.7–5.3)
SODIUM: 128 meq/L — AB (ref 137–147)

## 2013-02-01 LAB — POCT I-STAT TROPONIN I: Troponin i, poc: 0.01 ng/mL (ref 0.00–0.08)

## 2013-02-01 MED ORDER — TRAMADOL HCL 50 MG PO TABS: 50.0000 mg | ORAL_TABLET | Freq: Four times a day (QID) | ORAL | Status: DC | PRN

## 2013-02-01 MED ORDER — ALBUTEROL SULFATE HFA 108 (90 BASE) MCG/ACT IN AERS
2.0000 | INHALATION_SPRAY | RESPIRATORY_TRACT | Status: DC | PRN
Start: 2013-02-01 — End: 2013-02-01
  Administered 2013-02-01: 2 via RESPIRATORY_TRACT
  Filled 2013-02-01: qty 6.7

## 2013-02-01 MED ORDER — IBUPROFEN 800 MG PO TABS
800.0000 mg | ORAL_TABLET | Freq: Once | ORAL | Status: AC
  Administered 2013-02-01: 800 mg via ORAL
  Filled 2013-02-01: qty 1

## 2013-02-01 NOTE — ED Notes (Signed)
Patient is alert and orientedx4.  Patient was explained discharge instructions and they understood them with no questions.   

## 2013-02-01 NOTE — ED Notes (Addendum)
Multiple complaints. Reports headache and "lung pain." reports having pain under bilateral ribs, productive cough, sob and back pain. ekg done at triage and airway is intact.

## 2013-02-01 NOTE — ED Provider Notes (Signed)
CSN: [id number]     Arrival date & time 02/01/13  1207 History   First MD Initiated Contact with Patient 02/01/13 1319     Chief Complaint  Patient presents with  . Chest Pain  . Shortness of Breath   (Consider location/radiation/quality/duration/timing/severity/associated sxs/prior Treatment) Patient is a 48 y.o. male presenting with chest pain and shortness of breath.  Chest Pain Associated symptoms: shortness of breath   Shortness of Breath Associated symptoms: chest pain    Pt reports about 2 months of moderate aching bilateral lower rib pain, worse with deep breath and coughing. No fever, no SOB. Not improved with NSAIDs at home.   Past Medical History  Diagnosis Date  . Hypertension   . Asthma   . GERD (gastroesophageal reflux disease)   . Chronic pain   . Schizophrenia    History reviewed. No pertinent past surgical history. Family History  Problem Relation Age of Onset  . Malignant hyperthermia Mother   . Cirrhosis Father   . Alcohol abuse Father    History  Substance Use Topics  . Smoking status: Current Every Day Smoker -- 1.00 packs/day    Types: Cigarettes  . Smokeless tobacco: Not on file  . Alcohol Use: 1.2 oz/week    2 Cans of beer per week     Comment: 1 400 oz per week    Review of Systems  Respiratory: Positive for shortness of breath.   Cardiovascular: Positive for chest pain.   All other systems reviewed and are negative except as noted in HPI.   Allergies  Cetirizine & related; Hctz; Hydroxyzine; and Sulfonamide derivatives  Home Medications   Current Outpatient Rx  Name  Route  Sig  Dispense  Refill  . albuterol (PROVENTIL HFA;VENTOLIN HFA) 108 (90 BASE) MCG/ACT inhaler   Inhalation   Inhale 2 puffs into the lungs every 6 (six) hours as needed. Wheezing or shortness of breath         . benztropine (COGENTIN) 0.5 MG tablet   Oral   Take 0.5 mg by mouth 2 (two) times daily.         Marland Kitchen gabapentin (NEURONTIN) 300 MG capsule   Oral   Take 300 mg by mouth 3 (three) times daily.         . haloperidol decanoate (HALDOL DECANOATE) 100 MG/ML injection   Intramuscular   Inject 100 mg into the muscle every 28 (twenty-eight) days.         . niacin (NIASPAN) 500 MG CR tablet   Oral   Take 500 mg by mouth at bedtime.         Marland Kitchen omeprazole (PRILOSEC) 20 MG capsule   Oral   Take 20 mg by mouth daily.         Marland Kitchen PARoxetine (PAXIL) 40 MG tablet   Oral   Take 40 mg by mouth every morning.         . potassium chloride (K-DUR,KLOR-CON) 10 MEQ tablet   Oral   Take 10 mEq by mouth daily.         . rosuvastatin (CRESTOR) 20 MG tablet   Oral   Take 20 mg by mouth daily.         . traZODone (DESYREL) 100 MG tablet   Oral   Take 100 mg by mouth at bedtime.          BP 135/79  Pulse 79  Temp(Src) 97.9 F (36.6 C) (Oral)  Resp 17  Wt 184 lb (83.462  kg)  SpO2 98% Physical Exam  Nursing note and vitals reviewed. Constitutional: He is oriented to person, place, and time. He appears well-developed and well-nourished.  HENT:  Head: Normocephalic and atraumatic.  Eyes: EOM are normal. Pupils are equal, round, and reactive to light.  Neck: Normal range of motion. Neck supple.  Cardiovascular: Normal rate, normal heart sounds and intact distal pulses.   Pulmonary/Chest: Effort normal and breath sounds normal. No respiratory distress. He has no wheezes. He has no rales.  Abdominal: Bowel sounds are normal. He exhibits no distension. There is no tenderness.  Musculoskeletal: Normal range of motion. He exhibits no edema and no tenderness.  Neurological: He is alert and oriented to person, place, and time. He has normal strength. No cranial nerve deficit or sensory deficit.  Skin: Skin is warm and dry. No rash noted.  Psychiatric: He has a normal mood and affect.    ED Course  Procedures (including critical care time) Labs Review Labs Reviewed  CBC - Abnormal; Notable for the following:    RBC 4.13 (*)     HCT 36.9 (*)    MCHC 37.3 (*)    All other components within normal limits  BASIC METABOLIC PANEL - Abnormal; Notable for the following:    Sodium 128 (*)    Chloride 89 (*)    All other components within normal limits  PRO B NATRIURETIC PEPTIDE - Abnormal; Notable for the following:    Pro B Natriuretic peptide (BNP) 192.7 (*)    All other components within normal limits  POCT I-STAT TROPONIN I   Imaging Review Dg Chest 2 View  02/01/2013   CLINICAL DATA:  Shortness of breath, smoker, bilateral chest pain  EXAM: CHEST  2 VIEW  COMPARISON:  05/09/2011  FINDINGS: The heart size and mediastinal contours are within normal limits. Both lungs are clear. The visualized skeletal structures are unremarkable.  IMPRESSION: No active cardiopulmonary disease.   Electronically Signed   By: Skipper Cliche M.D.   On: 02/01/2013 13:21    EKG Interpretation    Date/Time:  Thursday February 01 2013 12:11:21 EST Ventricular Rate:  69 PR Interval:  184 QRS Duration: 90 QT Interval:  412 QTC Calculation: 441 R Axis:   88 Text Interpretation:  Sinus rhythm with Premature atrial complexes Cannot rule out Anterior infarct , age undetermined Abnormal ECG No significant change since last tracing Confirmed by [facility]  MD, Mikah Poss [phone]) on 02/01/2013 1:10:34 PM            MDM   1. Chest wall pain     Pt with weeks of pleuritic bilateral chest pains. No concerning findings on CXR or EKG. ACS or PE. Advised to stop smoking.     Alisan Dokes B. Karle Starch, MD 02/01/13 1431

## 2013-02-01 NOTE — Discharge Instructions (Signed)

## 2013-02-01 NOTE — ED Notes (Signed)
Pt presents with multiple complaints ongoing for months. He reports left upper shoulder pain; denies injury, full ROM, denies radiation from chest. Pt also reports upper abdominal pain, mildly tender on palpation. Denies N/V/D. Pt also reports being out of inhaler for chronic bronchitis, lungs diminished bilaterally.

## 2013-06-24 ENCOUNTER — Emergency Department (HOSPITAL_COMMUNITY)
Admission: EM | Admit: 2013-06-24 | Discharge: 2013-06-24 | Disposition: A | Discharge status: Home / Self Care | Payer: Medicare Other | Attending: Emergency Medicine | Admitting: Emergency Medicine

## 2013-06-24 ENCOUNTER — Emergency Department (HOSPITAL_COMMUNITY): Payer: Medicare Other

## 2013-06-24 ENCOUNTER — Encounter (HOSPITAL_COMMUNITY): Payer: Self-pay | Admitting: Emergency Medicine

## 2013-06-24 DIAGNOSIS — M25559 Pain in unspecified hip: Secondary | ICD-10-CM | POA: Insufficient documentation

## 2013-06-24 DIAGNOSIS — R05 Cough: Secondary | ICD-10-CM

## 2013-06-24 DIAGNOSIS — G8929 Other chronic pain: Secondary | ICD-10-CM | POA: Diagnosis not present

## 2013-06-24 DIAGNOSIS — I10 Essential (primary) hypertension: Secondary | ICD-10-CM | POA: Diagnosis not present

## 2013-06-24 DIAGNOSIS — R059 Cough, unspecified: Secondary | ICD-10-CM | POA: Diagnosis not present

## 2013-06-24 DIAGNOSIS — Z79899 Other long term (current) drug therapy: Secondary | ICD-10-CM | POA: Diagnosis not present

## 2013-06-24 DIAGNOSIS — F209 Schizophrenia, unspecified: Secondary | ICD-10-CM | POA: Insufficient documentation

## 2013-06-24 DIAGNOSIS — J45909 Unspecified asthma, uncomplicated: Secondary | ICD-10-CM | POA: Insufficient documentation

## 2013-06-24 DIAGNOSIS — K219 Gastro-esophageal reflux disease without esophagitis: Secondary | ICD-10-CM | POA: Insufficient documentation

## 2013-06-24 DIAGNOSIS — F17200 Nicotine dependence, unspecified, uncomplicated: Secondary | ICD-10-CM | POA: Insufficient documentation

## 2013-06-24 DIAGNOSIS — M25551 Pain in right hip: Secondary | ICD-10-CM

## 2013-06-24 HISTORY — DX: Alcohol abuse, uncomplicated: F10.10

## 2013-06-24 NOTE — ED Provider Notes (Signed)
CSN: [id number]     Arrival date & time 06/24/13  0932 History   First MD Initiated Contact with Patient 06/24/13 0357     Chief Complaint  Patient presents with  . Hip Pain  . Cough      HPI Pt was seen at 0410. Per pt, c/o gradual onset and persistence of constant right hip pain "for a while." Pt describes the pain as "aching." Cannot recall injury. States he came for evaluation today because his family member is here and "I wanted a sandwich and they wouldn't give me one unless I was a patient." Pt also c/o intermittent cough "for a while too" and "figure I'd mention that while I'm here." Denies any change in his symptoms. Denies CP/palpitations, no SOB/wheezing, no abd pain, no N/V/D, no back pain, no focal motor weakness, no tingling/numbness in extremities, no fevers, no rash.    Past Medical History  Diagnosis Date  . Hypertension   . Asthma   . GERD (gastroesophageal reflux disease)   . Chronic pain   . Schizophrenia   . Alcohol abuse    History reviewed. No pertinent past surgical history. Family History  Problem Relation Age of Onset  . Malignant hyperthermia Mother   . Cirrhosis Father   . Alcohol abuse Father    History  Substance Use Topics  . Smoking status: Current Every Day Smoker -- 1.00 packs/day    Types: Cigarettes  . Smokeless tobacco: Not on file  . Alcohol Use: 1.2 oz/week    2 Cans of beer per week     Comment: 1 400 oz per week    Review of Systems ROS: Statement: All systems negative except as marked or noted in the HPI; Constitutional: Negative for fever and chills. ; ; Eyes: Negative for eye pain, redness and discharge. ; ; ENMT: Negative for ear pain, hoarseness, nasal congestion, sinus pressure and sore throat. ; ; Cardiovascular: Negative for chest pain, palpitations, diaphoresis, dyspnea and peripheral edema. ; ; Respiratory: +cough. Negative for wheezing and stridor. ; ; Gastrointestinal: Negative for nausea, vomiting, diarrhea, abdominal  pain, blood in stool, hematemesis, jaundice and rectal bleeding. . ; ; Genitourinary: Negative for dysuria, flank pain and hematuria. ; ; Musculoskeletal: +right hip pain. Negative for back pain and neck pain. Negative for swelling and trauma.; ; Skin: Negative for pruritus, rash, abrasions, blisters, bruising and skin lesion.; ; Neuro: Negative for headache, lightheadedness and neck stiffness. Negative for weakness, altered level of consciousness , altered mental status, extremity weakness, paresthesias, involuntary movement, seizure and syncope.      Allergies  Cetirizine & related; Hctz; Hydroxyzine; and Sulfonamide derivatives  Home Medications   Prior to Admission medications   Medication Sig Start Date End Date Taking? Authorizing Provider  albuterol (PROVENTIL HFA;VENTOLIN HFA) 108 (90 BASE) MCG/ACT inhaler Inhale 2 puffs into the lungs every 6 (six) hours as needed. Wheezing or shortness of breath   Yes Historical Provider, MD  benztropine (COGENTIN) 0.5 MG tablet Take 0.5 mg by mouth 2 (two) times daily.   Yes Historical Provider, MD  gabapentin (NEURONTIN) 300 MG capsule Take 300 mg by mouth 3 (three) times daily.   Yes Historical Provider, MD  haloperidol decanoate (HALDOL DECANOATE) 100 MG/ML injection Inject 100 mg into the muscle every 28 (twenty-eight) days.   Yes Historical Provider, MD  niacin (NIASPAN) 500 MG CR tablet Take 500 mg by mouth at bedtime.   Yes Historical Provider, MD  omeprazole (PRILOSEC) 20 MG capsule Take 20  mg by mouth daily.   Yes Historical Provider, MD  PARoxetine (PAXIL) 40 MG tablet Take 40 mg by mouth every morning.   Yes Historical Provider, MD  potassium chloride (K-DUR,KLOR-CON) 10 MEQ tablet Take 10 mEq by mouth daily.   Yes Historical Provider, MD  rosuvastatin (CRESTOR) 20 MG tablet Take 20 mg by mouth daily.   Yes Historical Provider, MD  traMADol (ULTRAM) 50 MG tablet Take 1 tablet (50 mg total) by mouth every 6 (six) hours as needed. 02/01/13  Yes  Charles B. Karle Starch, MD  traZODone (DESYREL) 100 MG tablet Take 100 mg by mouth at bedtime.   Yes Historical Provider, MD   BP 130/69  Pulse 68  Temp(Src) 97.8 F (36.6 C) (Oral)  Resp 18  SpO2 100% Physical Exam 0415: Physical examination:  Nursing notes reviewed; Vital signs and O2 SAT reviewed;  Constitutional: Well developed, Well nourished, Well hydrated, In no acute distress; Head:  Normocephalic, atraumatic; Eyes: EOMI, PERRL, No scleral icterus; ENMT: Mouth and pharynx normal, Mucous membranes moist; Neck: Supple, Full range of motion, No lymphadenopathy; Cardiovascular: Regular rate and rhythm, No murmur, rub, or gallop; Respiratory: Breath sounds clear & equal bilaterally, No rales, rhonchi, wheezes.  Speaking full sentences with ease, Normal respiratory effort/excursion; Chest: Nontender, Movement normal; Abdomen: Soft, Nontender, Nondistended, Normal bowel sounds; Genitourinary: No CVA tenderness; Extremities: Pulses normal, No deformity. Pelvis stable. NT right hip/knee/ankle/foot. No edema, No calf edema or asymmetry.; Neuro: AA&Ox3, Major CN grossly intact.  Speech clear. No gross focal motor or sensory deficits in extremities. Climbs on and off stretcher easily by himself. Gait steady.; Skin: Color normal, Warm, Dry.   ED Course  Procedures     MDM  MDM Reviewed: previous chart, nursing note and vitals Interpretation: x-ray    Dg Chest 2 View 06/24/2013   CLINICAL DATA:  Chest pain and shortness of breath. History of smoking.  EXAM: CHEST  2 VIEW  COMPARISON:  Chest radiograph performed 02/01/2013  FINDINGS: The lungs are well-aerated and clear. There is no evidence of focal opacification, pleural effusion or pneumothorax. Bilateral nipple shadows are noted.  The heart is normal in size; the mediastinal contour is within normal limits. No acute osseous abnormalities are seen.  IMPRESSION: No acute cardiopulmonary process seen.   Electronically Signed   By: Garald Balding M.D.    On: 06/24/2013 05:22   Dg Hip Complete Right 06/24/2013   CLINICAL DATA:  Right hip pain.  EXAM: RIGHT HIP - COMPLETE 2+ VIEW  COMPARISON:  None.  FINDINGS: There is no evidence of fracture or dislocation. Both femoral heads are seated normally within their respective acetabula. The proximal right femur appears intact. No significant degenerative change is appreciated. The sacroiliac joints are unremarkable in appearance.  The visualized bowel gas pattern is grossly unremarkable in appearance.  IMPRESSION: No evidence of fracture or dislocation.   Electronically Signed   By: Garald Balding M.D.   On: 06/24/2013 04:14    0600:  Pt continues ambulatory while in the ED, gait steady, resps easy, NAD. Lungs continue CTA bilat without wheezing. Pt states he is ready to go now that his family member has been dispositioned and he has eaten a meal. Dx and testing d/w pt.  Questions answered.  Verb understanding, agreeable to d/c home with outpt f/u.   Alfonzo Feller, DO 06/26/13 [place]

## 2013-06-24 NOTE — ED Notes (Signed)
Gown found on bed,  Pt has left without being discharged
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
## 2013-06-24 NOTE — Discharge Instructions (Signed)
°Emergency Department Resource Guide °1) Find a Doctor and Pay Out of Pocket °Although you won't have to find out who is covered by your insurance plan, it is a good idea to ask around and get recommendations. You will then need to call the office and see if the doctor you have chosen will accept you as a new patient and what types of options they offer for patients who are self-pay. Some doctors offer discounts or will set up payment plans for their patients who do not have insurance, but you will need to ask so you aren't surprised when you get to your appointment. ° °2) Contact Your Local Health Department °Not all health departments have doctors that can see patients for sick visits, but many do, so it is worth a call to see if yours does. If you don't know where your local health department is, you can check in your phone book. The CDC also has a tool to help you locate your state's health department, and many state websites also have listings of all of their local health departments. ° °3) Find a Walk-in Clinic °If your illness is not likely to be very severe or complicated, you may want to try a walk in clinic. These are popping up all over the country in pharmacies, drugstores, and shopping centers. They're usually staffed by nurse practitioners or physician assistants that have been trained to treat common illnesses and complaints. They're usually fairly quick and inexpensive. However, if you have serious medical issues or chronic medical problems, these are probably not your best option. ° °No Primary Care Doctor: °- Call [facility] at  832-8000 - they can help you locate a primary care doctor that  accepts your insurance, provides certain services, etc. °- Physician Referral Service- 1-800-533-3463 ° °Chronic Pain Problems: °Organization         Address  Phone   Notes  °[facility]  (336) 297-2271 Patients need to be referred by their primary care doctor.  ° °Medication  Assistance: °Organization         Address  Phone   Notes  °[place] [facility] [address] °[place], [place] [address] (336) 641-8030 --Must be a resident of [place] °-- Must have NO insurance coverage whatsoever (no Medicaid/ Medicare, etc.) °-- The pt. MUST have a primary care doctor that directs their care regularly and follows them in the community °  °[facility]  (866) 331-1348   °[employer]  (888) 892-1162   ° °Agencies that provide inexpensive medical care: °Organization         Address  Phone   Notes  °[facility]  (336) 832-8035   °[facility]    (336) 832-7272   °[facility] [address] °[place], [place] [address] (336) 832-4777   °[facility] [address], °[place] (336) 271-4999   °[facility]    (336) 373-0678   °[facility]    (336) 272-1050   °[facility] ° [address], [place] Phone:  (336) 832-4444, Fax:  (336) 832-4440 Hours of Operation:  9 am - 6 pm, M-F.  Also accepts Medicaid/Medicare and self-pay.  ° [facility] ° [address],  Phone: (336) 832-3150, Fax: (336) 832-3151. Hours of Operation:  8:30 am - 5:30 pm, M-F.  Also accepts Medicaid and self-pay.  °[facility] [place] [address], [place] Phone: (336) 878-6027   °[facility] [address], [place], [place] (336)723-1848, Ext. 123 Mondays & Thursdays: 7-9 AM.  First 15 patients are seen on a first come, first serve basis. °  ° °Medicaid-accepting [place] Providers: ° °Organization         Address  Phone   Notes  °[facility] [address], [place] (336) 641-2100 Also accepts self-pay patients.  °[facility] [address], [place] ° (336) 856-9996   °[facility] [address], [place]  (336) 288-8857   °[facility] [address] [place] [address], [place] (336) 299-7000   °Veita Bland [address], [place]  ° (336) 373-1557 Only accepts [place] Access Medicaid patients after they have their name applied to their card.  ° °Self-Pay (no insurance) in [place]: ° °Organization         Address  Phone   Notes  °Sickle Cell Patients, [facility] [address], [place] (336) 832-1970   °[facility] [address], [place] (336) 832-4400   °[facility] ° [address], [place] (336) 992-4800   °[facility]/Dr. Osei-Bonsu ° [address] [place] [address], [place] or [address], [place] (336) 841-8500 Phone number for both [place] and [place] locations is the same.  °[facility] [address], [place] (336) 299-0000   °[facility] [address] [place] [address], [place] or [address] (336) 852-7530 °(336) 878-2260   °[facility] [address], [place] (336) 350-1642, phone; (336) 294-5005, fax Sees patients 1st and 3rd Saturday of every month.  Must not qualify for public or private insurance (i.e. Medicaid, Medicare, [place] Health Choice, Veterans' Benefits) • Household income should be no more than 200% of the poverty level •The clinic cannot treat you if you are pregnant or think you are pregnant • Sexually transmitted diseases are not treated at the clinic.  ° ° °Dental Care: °Organization         Address  Phone  Notes  °[place] [facility] [address], [place] (336) 641-6152 Accepts children up to age 21 who are enrolled in Medicaid or [place] Health Choice; pregnant women with a Medicaid card; and children who have applied for Medicaid or [place] Health Choice, but were declined, whose parents can pay a reduced fee at time of service.  °[place]  [facility] [place]  [address], [place] (336) 641-7733 Accepts children up to age 21 who are enrolled in Medicaid or [place] Health Choice; pregnant women with a Medicaid card; and children who have applied for Medicaid or  Health Choice, but were declined, whose parents can pay a reduced fee at time of service.  °[facility] ° [address],  (336) 641-4533 Patients are seen by appointment only. Walk-ins are not accepted. [facility] will see patients 18 years of age and older. °Monday - Tuesday (8am-5pm) °Most Wednesdays (8:30-5pm) °$30 per visit, cash only  °[facility] ° [address], [place] (336) 641-4533 Patients are seen by appointment only. Walk-ins are not accepted. [facility] will see patients 18 years of age and older. °One   Wednesday Evening (Monthly: Volunteer Based).  $30 per visit, cash only  °[facility]  (919) 537-3737 for adults; Children under age 4, call Graduate Pediatric Dentistry at ([phone]) [phone]. Children aged 4-14, please call (919) [phone] to request a pediatric application. ° Dental services are provided in all areas of dental care including fillings, crowns and bridges, complete and partial dentures, implants, gum treatment, root canals, and extractions. Preventive care is also provided. Treatment is provided to both adults and children. °Patients are selected via a lottery and there is often a waiting list. °  °[facility] [address], °[place] ° (336) 763-8833 www.drcivils.[email or web address] °  °[facility] [address], [place], [place] (336)723-1848, Ext. 123 Second and Fourth Thursday of each month, opens at 6:30 AM; Clinic ends at 9 AM.  Patients are seen on a first-come first-served basis, and a limited number are seen during each clinic.  ° °[facility] ° [address], [place], [place] (336) 723-7904    Eligibility Requirements °You must have lived in [place], [place], or [place] counties for at least the last three months. °  You cannot be eligible for state or federal sponsored healthcare insurance, including Veterans Administration, Medicaid, or Medicare. °  You generally cannot be eligible for healthcare insurance through your employer.  °  How to apply: °Eligibility screenings are held every Tuesday and Wednesday afternoon from 1:00 pm until 4:00 pm. You do not need an appointment for the interview!  °[facility] [address], [place], [place] 336-631-2330   °[facility]  336-342-8273   °[facility]  336-703-3100   °[facility]  336-570-6415   ° °Behavioral Health Resources in the Community: °Intensive Outpatient Programs °Organization         Address  Phone  Notes  °[facility] [address], [place], [place] 336-878-6098   °[facility] [address], [place], [place] 336-832-9800   °[facility] [address], [place], [place] ° 336-882-2125   °[facility] [address],  °[place], [place] 1-800-853-5163 or 336-641-4981   °Substance Abuse Resources °Organization         Address  Phone  Notes  °[facility]  336-882-2125   °[facility]  336-784-9470   °[facility]  336-285-9073   °[facility]  336-845-3988   °Residential & Outpatient Substance Abuse Program  1-800-659-3381   °Psychological Services °Organization         Address  Phone  Notes  ° Health  336- 832-9600   °[facility]  336- 378-7881   °[facility] [address], [place] 1-800-853-5163 or 336-641-4981   ° °Mobile Crisis Teams °Organization         Address  Phone  Notes  °[facility], Mobile Crisis Care Unit  1-877-626-1772   °Assertive °[facility] ° [address]  [place], [place] 336-834-9664   °Sharon DeEsch [address] ° [place] 336-554-5454   ° °Self-Help/Support Groups °Organization         Address  Phone             Notes  °[facility]  - variety of support groups  336- 373-1402 Call for more information  °Narcotics Anonymous (NA), [facility] [address], °[place]  2 meetings at this location  ° °  Residential Treatment Programs Organization         Address  Phone  Notes  [facility] [address],    [place]  1-(769)664-1063   [facility]  [address] [place] [address], [place], [place]   [place], [place] 332-751-7780 Admissions: 8am-3pm M-F  [facility] [address],    [place], [place] [phone]   [facility] [address] [place], [place], [place]   [facility] [address]  [place], [place]   [facility] Intensive Outpatient [address], [place], [place]   [facility] ([place] [address]  [place], [place] 1-(910) 352-8273 or 917-593-9565   [facility] [address] [place], [place] Accepts Medicaid  [facility] [address] [place] [address]  [place] 1-2257847752 Substance Abuse/Addiction Treatment   [facility] Organization         Address  Phone  Notes  [facility]  305-082-4354   Domenic Schwab, PhD [address] [place], [place]   (808) 333-8769 or 2072144387   [facility] [place], [place] 204-431-9170   [facility] [address] [place], [place] [phone] Insurance/Medicaid/sponsorship through [place] [facility] [address]                                    [place], [place] [phone] [facility] [address], [place] (725)[phone]    Dr. Adele Schilder  ([phone])[phone]   [facility] Dept. 1) [address] [place] 2) [place] 3)  [address], [place] [phone] [phone]  [phone]   [place] 405-332-7954 or [phone] (After Hours)       Take your usual prescriptions as previously directed.  Apply moist heat or ice to the area(s) of discomfort, for 15 minutes at a time, several times per day for the next few days.  Do not fall asleep on a heating or ice pack.  Call your regular medical doctor on Monday to schedule a follow up appointment within the next 2 days.  Return to the Emergency Department immediately if worsening.

## 2013-06-27 ENCOUNTER — Emergency Department (HOSPITAL_COMMUNITY): Payer: Medicare Other

## 2013-06-27 ENCOUNTER — Emergency Department (HOSPITAL_COMMUNITY)
Admission: EM | Admit: 2013-06-27 | Discharge: 2013-06-27 | Disposition: A | Discharge status: Home / Self Care | Payer: Medicare Other | Attending: Emergency Medicine | Admitting: Emergency Medicine

## 2013-06-27 ENCOUNTER — Encounter (HOSPITAL_COMMUNITY): Payer: Self-pay | Admitting: Emergency Medicine

## 2013-06-27 DIAGNOSIS — J45909 Unspecified asthma, uncomplicated: Secondary | ICD-10-CM | POA: Insufficient documentation

## 2013-06-27 DIAGNOSIS — F17200 Nicotine dependence, unspecified, uncomplicated: Secondary | ICD-10-CM | POA: Insufficient documentation

## 2013-06-27 DIAGNOSIS — F10920 Alcohol use, unspecified with intoxication, uncomplicated: Secondary | ICD-10-CM

## 2013-06-27 DIAGNOSIS — G8929 Other chronic pain: Secondary | ICD-10-CM | POA: Insufficient documentation

## 2013-06-27 DIAGNOSIS — Z8719 Personal history of other diseases of the digestive system: Secondary | ICD-10-CM | POA: Insufficient documentation

## 2013-06-27 DIAGNOSIS — Z79899 Other long term (current) drug therapy: Secondary | ICD-10-CM | POA: Insufficient documentation

## 2013-06-27 DIAGNOSIS — F209 Schizophrenia, unspecified: Secondary | ICD-10-CM | POA: Insufficient documentation

## 2013-06-27 DIAGNOSIS — F1010 Alcohol abuse, uncomplicated: Secondary | ICD-10-CM | POA: Insufficient documentation

## 2013-06-27 DIAGNOSIS — I10 Essential (primary) hypertension: Secondary | ICD-10-CM | POA: Insufficient documentation

## 2013-06-27 LAB — PRO B NATRIURETIC PEPTIDE: PRO B NATRI PEPTIDE: 146.2 pg/mL — AB (ref 0–125)

## 2013-06-27 LAB — CBC WITH DIFFERENTIAL/PLATELET
Basophils Absolute: 0 10*3/uL (ref 0.0–0.1)
Basophils Relative: 0 % (ref 0–1)
EOS PCT: 2 % (ref 0–5)
Eosinophils Absolute: 0.2 10*3/uL (ref 0.0–0.7)
HEMATOCRIT: 39.3 % (ref 39.0–52.0)
Hemoglobin: 13.8 g/dL (ref 13.0–17.0)
LYMPHS ABS: 1.9 10*3/uL (ref 0.7–4.0)
LYMPHS PCT: 26 % (ref 12–46)
MCH: 32.2 pg (ref 26.0–34.0)
MCHC: 35.1 g/dL (ref 30.0–36.0)
MCV: 91.8 fL (ref 78.0–100.0)
MONO ABS: 0.5 10*3/uL (ref 0.1–1.0)
MONOS PCT: 7 % (ref 3–12)
Neutro Abs: 4.6 10*3/uL (ref 1.7–7.7)
Neutrophils Relative %: 65 % (ref 43–77)
Platelets: 305 10*3/uL (ref 150–400)
RBC: 4.28 MIL/uL (ref 4.22–5.81)
RDW: 12.9 % (ref 11.5–15.5)
WBC: 7.2 10*3/uL (ref 4.0–10.5)

## 2013-06-27 LAB — BASIC METABOLIC PANEL
BUN: 8 mg/dL (ref 6–23)
CO2: 25 meq/L (ref 19–32)
CREATININE: 0.75 mg/dL (ref 0.50–1.35)
Calcium: 8.8 mg/dL (ref 8.4–10.5)
Chloride: 97 mEq/L (ref 96–112)
GFR calc Af Amer: >90 mL/min (ref >90)
GFR calc non Af Amer: >90 mL/min (ref >90)
GLUCOSE: 80 mg/dL (ref 70–99)
Potassium: 3.8 mEq/L (ref 3.7–5.3)
Sodium: 136 mEq/L — ABNORMAL LOW (ref 137–147)

## 2013-06-27 LAB — TROPONIN I: Troponin I: <0.3 ng/mL (ref <0.30)

## 2013-06-27 MED ORDER — SODIUM CHLORIDE 0.9 % IV BOLUS (SEPSIS)
1000.0000 mL | Freq: Once | INTRAVENOUS | Status: AC
  Administered 2013-06-27: 1000 mL via INTRAVENOUS

## 2013-06-27 MED ORDER — ALBUTEROL SULFATE HFA 108 (90 BASE) MCG/ACT IN AERS: 1.0000 | INHALATION_SPRAY | RESPIRATORY_TRACT | Status: DC | PRN

## 2013-06-27 MED ORDER — PREDNISONE 20 MG PO TABS
60.0000 mg | ORAL_TABLET | Freq: Once | ORAL | Status: AC
  Administered 2013-06-27: 60 mg via ORAL
  Filled 2013-06-27: qty 3

## 2013-06-27 MED ORDER — IBUPROFEN 800 MG PO TABS
800.0000 mg | ORAL_TABLET | Freq: Once | ORAL | Status: AC
  Administered 2013-06-27: 800 mg via ORAL
  Filled 2013-06-27: qty 1

## 2013-06-27 MED ORDER — ALBUTEROL SULFATE (2.5 MG/3ML) 0.083% IN NEBU
5.0000 mg | INHALATION_SOLUTION | Freq: Once | RESPIRATORY_TRACT | Status: AC
Start: 2013-06-27 — End: 2013-06-27
  Administered 2013-06-27: 5 mg via RESPIRATORY_TRACT
  Filled 2013-06-27: qty 6

## 2013-06-27 MED ORDER — HYDROCODONE-ACETAMINOPHEN 5-325 MG PO TABS: ORAL_TABLET | ORAL | Status: DC

## 2013-06-27 NOTE — Discharge Instructions (Signed)
Do not hesitate to return to the Emergency Department for any new, worsening or concerning symptoms.   If you do not have a primary care doctor you can establish one at the   [facility]: [place] [phone]  After you establish care. Let them know you were seen in the emergency room. They must obtain records for further management.    Alcohol Intoxication Alcohol intoxication occurs when the amount of alcohol that a person has consumed impairs his or her ability to mentally and physically function. Alcohol directly impairs the normal chemical activity of the brain. Drinking large amounts of alcohol can lead to changes in mental function and behavior, and it can cause many physical effects that can be harmful.  Alcohol intoxication can range in severity from mild to very severe. Various factors can affect the level of intoxication that occurs, such as the person's age, gender, weight, frequency of alcohol consumption, and the presence of other medical conditions (such as diabetes, seizures, or heart conditions). Dangerous levels of alcohol intoxication may occur when people drink large amounts of alcohol in a short period (binge drinking). Alcohol can also be especially dangerous when combined with certain prescription medicines or "recreational" drugs. SIGNS AND SYMPTOMS Some common signs and symptoms of mild alcohol intoxication include:  Loss of coordination.  Changes in mood and behavior.  Impaired judgment.  Slurred speech. As alcohol intoxication progresses to more severe levels, other signs and symptoms will appear. These may include:  Vomiting.  Confusion and impaired memory.  Slowed breathing.  Seizures.  Loss of consciousness. DIAGNOSIS  Your health care provider will take a medical history and perform a physical exam. You will be asked about the amount and type of alcohol you have consumed. Blood tests will be done to  measure the concentration of alcohol in your blood. In many places, your blood alcohol level must be lower than 80 mg/dL (0.08%) to legally drive. However, many dangerous effects of alcohol can occur at much lower levels.  TREATMENT  People with alcohol intoxication often do not require treatment. Most of the effects of alcohol intoxication are temporary, and they go away as the alcohol naturally leaves the body. Your health care provider will monitor your condition until you are stable enough to go home. Fluids are sometimes given through an IV access tube to help prevent dehydration.  HOME CARE INSTRUCTIONS  Do not drive after drinking alcohol.  Stay hydrated. Drink enough water and fluids to keep your urine clear or pale yellow. Avoid caffeine.   Only take over-the-counter or prescription medicines as directed by your health care provider.  SEEK MEDICAL CARE IF:   You have persistent vomiting.   You do not feel better after a few days.  You have frequent alcohol intoxication. Your health care provider can help determine if you should see a substance use treatment counselor. SEEK IMMEDIATE MEDICAL CARE IF:   You become shaky or tremble when you try to stop drinking.   You shake uncontrollably (seizure).   You throw up (vomit) blood. This may be bright red or may look like black coffee grounds.   You have blood in your stool. This may be bright red or may appear as a black, tarry, bad smelling stool.   You become lightheaded or faint.  MAKE SURE YOU:   Understand these instructions.  Will watch your condition.  Will get help right away if you are not doing well or get worse.  Document Released: 09/30/2004 Document Revised: 08/23/2012 Document Reviewed: 05/26/2012 [facility] Patient Information 2015 [place], [place]. This information is not intended to replace advice given to you by your health care provider. Make sure you discuss any questions you have with your health care  provider.

## 2013-06-27 NOTE — ED Notes (Signed)
Pt. Was walking down the road and a individual saw the pt. And asked him to come into the yard as they called 911.   Pt. Reports that his rt. Hip hurts denies any chest pain presently.  Pt. Does have ETOH on board 2 shots and a forty.  Pt. Received 500cc bag of NSS also  5mg  Albuterol treatment for bilateral wheezing.  Pt. Is out of his rescue inhaler

## 2013-06-27 NOTE — ED Provider Notes (Signed)
Medical screening examination/treatment/procedure(s) were performed by non-physician practitioner and as supervising physician I was immediately available for consultation/collaboration.   EKG Interpretation None       Date: 06/27/2013  Rate: 100  Rhythm: sinus tachycardia  QRS Axis: left  Intervals: normal  ST/T Wave abnormalities: nonspecific T wave changes  Conduction Disutrbances:none  Narrative Interpretation: Q waves anteroseptal  Old EKG Reviewed: unchanged from 01 Feb 2013    Rolland Porter, MD, Alanson Aly, MD 06/27/13 2320

## 2013-06-27 NOTE — ED Provider Notes (Signed)
CSN: [id number]     Arrival date & time 06/27/13  1810 History   First MD Initiated Contact with Patient 06/27/13 1849     Chief Complaint  Patient presents with  . Hip Pain    chest pain, Wheezing, dizzy, ETOH     (Consider location/radiation/quality/duration/timing/severity/associated sxs/prior Treatment) HPI  Chase Blackwell is a 48 y.o. male  Past medical history significant for asthma, active daily smoker, schizophrenia alcohol abuse and hypertension complaining of right hip painand heat exhaustion. Patient has been outside all day, he has been drinking today: 2 shots and a 40 ounce beer. 911 was called, EMS heard wheezing on exam and gave patient nebulizer treatment. Patient states he is out of his rescue inhaler. Patient denies chest pain, shortness of breath, nausea vomiting, change in bowel or bladder habits, abdominal pain  Past Medical History  Diagnosis Date  . Hypertension   . Asthma   . GERD (gastroesophageal reflux disease)   . Chronic pain   . Schizophrenia   . Alcohol abuse    History reviewed. No pertinent past surgical history. Family History  Problem Relation Age of Onset  . Malignant hyperthermia Mother   . Cirrhosis Father   . Alcohol abuse Father    History  Substance Use Topics  . Smoking status: Current Every Day Smoker -- 1.00 packs/day    Types: Cigarettes  . Smokeless tobacco: Not on file  . Alcohol Use: 1.2 oz/week    2 Cans of beer per week     Comment: 1 400 oz per week    Review of Systems  10 systems reviewed and found to be negative, except as noted in the HPI.   Allergies  Cetirizine & related; Hctz; Hydroxyzine; and Sulfonamide derivatives  Home Medications   Prior to Admission medications   Medication Sig Start Date End Date Taking? Authorizing Provider  acetaminophen (TYLENOL) 325 MG tablet Take 650 mg by mouth every 6 (six) hours as needed.   Yes Historical Provider, MD  albuterol (PROVENTIL HFA;VENTOLIN HFA) 108 (90 BASE)  MCG/ACT inhaler Inhale 2 puffs into the lungs every 6 (six) hours as needed. Wheezing or shortness of breath   Yes Historical Provider, MD  benztropine (COGENTIN) 0.5 MG tablet Take 0.5 mg by mouth daily.    Yes Historical Provider, MD  haloperidol decanoate (HALDOL DECANOATE) 100 MG/ML injection Inject 100 mg into the muscle every 28 (twenty-eight) days.   Yes Historical Provider, MD  niacin (NIASPAN) 500 MG CR tablet Take 500 mg by mouth at bedtime.   Yes Historical Provider, MD  PARoxetine (PAXIL) 40 MG tablet Take 40 mg by mouth every morning.   Yes Historical Provider, MD  rosuvastatin (CRESTOR) 20 MG tablet Take 20 mg by mouth daily.   Yes Historical Provider, MD  traZODone (DESYREL) 100 MG tablet Take 100 mg by mouth at bedtime.   Yes Historical Provider, MD  albuterol (PROVENTIL HFA;VENTOLIN HFA) 108 (90 BASE) MCG/ACT inhaler Inhale 1-2 puffs into the lungs every 4 (four) hours as needed for wheezing or shortness of breath. 06/27/13   Elmyra Ricks Pisciotta, PA-C  HYDROcodone-acetaminophen (NORCO/VICODIN) 5-325 MG per tablet Take 1-2 tablets by mouth every 6 hours as needed for pain. 06/27/13   Nicole Pisciotta, PA-C   BP 129/71  Pulse 100  Temp(Src) 99 F (37.2 C) (Oral)  Resp 11  SpO2 93% Physical Exam  Nursing note and vitals reviewed. Constitutional: He is oriented to person, place, and time. He appears well-developed and well-nourished. No distress.  HENT:  Head: Normocephalic and atraumatic.  Eyes: Conjunctivae and EOM are normal. Pupils are equal, round, and reactive to light.  Neck: Normal range of motion.  Cardiovascular: Normal rate, regular rhythm and intact distal pulses.   Pulmonary/Chest: Effort normal and breath sounds normal. No stridor. No respiratory distress. He has no wheezes. He has no rales. He exhibits no tenderness.  Abdominal: Soft. Bowel sounds are normal. He exhibits no distension and no mass. There is no tenderness. There is no rebound and no guarding.   Musculoskeletal: Normal range of motion. He exhibits no edema and no tenderness.  Neurological: He is alert and oriented to person, place, and time.  Slurred speech, alcohol on breath.  Psychiatric: He has a normal mood and affect.    ED Course  Procedures (including critical care time) Labs Review Labs Reviewed  BASIC METABOLIC PANEL - Abnormal; Notable for the following:    Sodium 136 (*)    All other components within normal limits  PRO B NATRIURETIC PEPTIDE - Abnormal; Notable for the following:    Pro B Natriuretic peptide (BNP) 146.2 (*)    All other components within normal limits  CBC WITH DIFFERENTIAL  TROPONIN I    Imaging Review Dg Hip Complete Right  06/27/2013   CLINICAL DATA:  Right hip pain for 1 week  EXAM: RIGHT HIP - COMPLETE 2+ VIEW  COMPARISON:  06/24/2013  FINDINGS: Three views of the right hip submitted. No acute fracture or subluxation. No radiopaque foreign body.  IMPRESSION: Negative.   Electronically Signed   By: Lahoma Crocker M.D.   On: 06/27/2013 19:18     EKG Interpretation None      MDM   Final diagnoses:  Alcohol intoxication, uncomplicated  Chronic pain    Filed Vitals:   06/27/13 1930 06/27/13 1933 06/27/13 1934 06/27/13 2000  BP: 112/62  112/62 129/71  Pulse: 92  89 100  Temp:  99 F (37.2 C)    TempSrc:  Oral    Resp: 13  14 11   SpO2: 96%  98% 93%    Medications  sodium chloride 0.9 % bolus 1,000 mL (0 mLs Intravenous Stopped 06/27/13 2109)  albuterol (PROVENTIL) (2.5 MG/3ML) 0.083% nebulizer solution 5 mg (5 mg Nebulization Given 06/27/13 1934)  ibuprofen (ADVIL,MOTRIN) tablet 800 mg (800 mg Oral Given 06/27/13 1948)  predniSONE (DELTASONE) tablet 60 mg (60 mg Oral Given 06/27/13 1948)    Chase Blackwell is a 48 y.o. male presenting with heat exhaustion, states that he has a left hip pain as well. Physical exam with no abnormalities. X-ray with no fracture, dislocation. Patient hydrated, fed, he is oriented x3 and in mentating  appropriately, he has a stable gait and is requesting discharge. Will write him a prescription for refill of his albuterol inhaler. My exam patient is not wheezing  Evaluation does not show pathology that would require ongoing emergent intervention or inpatient treatment. Pt is hemodynamically stable and mentating appropriately. Discussed findings and plan with patient/guardian, who agrees with care plan. All questions answered. Return precautions discussed and outpatient follow up given.   Discharge Medication List as of 06/27/2013  9:04 PM    START taking these medications   Details  HYDROcodone-acetaminophen (NORCO/VICODIN) 5-325 MG per tablet Take 1-2 tablets by mouth every 6 hours as needed for pain., [employer], PA-C 06/28/13 [phone]

## 2013-06-29 NOTE — ED Provider Notes (Signed)
Medical screening examination/treatment/procedure(s) were performed by non-physician practitioner and as supervising physician I was immediately available for consultation/collaboration.   EKG Interpretation None      Rolland Porter, MD, Abram Sander   Janice Norrie, MD 06/29/13 [phone]

## 2013-07-04 DIAGNOSIS — I469 Cardiac arrest, cause unspecified: Secondary | ICD-10-CM

## 2013-07-04 DIAGNOSIS — R569 Unspecified convulsions: Secondary | ICD-10-CM

## 2013-07-04 DIAGNOSIS — E46 Unspecified protein-calorie malnutrition: Secondary | ICD-10-CM

## 2013-07-04 HISTORY — DX: Unspecified protein-calorie malnutrition: E46

## 2013-07-04 HISTORY — DX: Unspecified convulsions: R56.9

## 2013-07-04 HISTORY — PX: TRACHEOSTOMY: SUR1362

## 2013-07-04 HISTORY — DX: Cardiac arrest, cause unspecified: I46.9

## 2013-07-05 ENCOUNTER — Emergency Department (HOSPITAL_COMMUNITY)
Admission: EM | Admit: 2013-07-05 | Discharge: 2013-07-05 | Discharge status: Against Medical Advice | Payer: Medicare Other | Source: Home / Self Care | Attending: Emergency Medicine | Admitting: Emergency Medicine

## 2013-07-05 ENCOUNTER — Ambulatory Visit (HOSPITAL_COMMUNITY): Admission: RE | Admit: 2013-07-05 | Payer: Medicare Other | Source: Ambulatory Visit

## 2013-07-05 ENCOUNTER — Emergency Department (HOSPITAL_COMMUNITY): Payer: Medicare Other

## 2013-07-05 ENCOUNTER — Encounter (HOSPITAL_COMMUNITY): Payer: Self-pay | Admitting: Emergency Medicine

## 2013-07-05 DIAGNOSIS — R062 Wheezing: Secondary | ICD-10-CM | POA: Insufficient documentation

## 2013-07-05 DIAGNOSIS — W108XXA Fall (on) (from) other stairs and steps, initial encounter: Secondary | ICD-10-CM | POA: Insufficient documentation

## 2013-07-05 DIAGNOSIS — S79919A Unspecified injury of unspecified hip, initial encounter: Secondary | ICD-10-CM

## 2013-07-05 DIAGNOSIS — R4182 Altered mental status, unspecified: Secondary | ICD-10-CM | POA: Diagnosis not present

## 2013-07-05 DIAGNOSIS — R0602 Shortness of breath: Secondary | ICD-10-CM | POA: Insufficient documentation

## 2013-07-05 DIAGNOSIS — F209 Schizophrenia, unspecified: Secondary | ICD-10-CM

## 2013-07-05 DIAGNOSIS — F1010 Alcohol abuse, uncomplicated: Secondary | ICD-10-CM

## 2013-07-05 DIAGNOSIS — I2119 ST elevation (STEMI) myocardial infarction involving other coronary artery of inferior wall: Secondary | ICD-10-CM | POA: Diagnosis not present

## 2013-07-05 DIAGNOSIS — I10 Essential (primary) hypertension: Secondary | ICD-10-CM | POA: Insufficient documentation

## 2013-07-05 DIAGNOSIS — Y939 Activity, unspecified: Secondary | ICD-10-CM | POA: Insufficient documentation

## 2013-07-05 DIAGNOSIS — Y929 Unspecified place or not applicable: Secondary | ICD-10-CM | POA: Insufficient documentation

## 2013-07-05 DIAGNOSIS — M542 Cervicalgia: Secondary | ICD-10-CM | POA: Insufficient documentation

## 2013-07-05 DIAGNOSIS — M25551 Pain in right hip: Secondary | ICD-10-CM

## 2013-07-05 DIAGNOSIS — G8929 Other chronic pain: Secondary | ICD-10-CM

## 2013-07-05 DIAGNOSIS — K219 Gastro-esophageal reflux disease without esophagitis: Secondary | ICD-10-CM | POA: Insufficient documentation

## 2013-07-05 DIAGNOSIS — Z889 Allergy status to unspecified drugs, medicaments and biological substances status: Secondary | ICD-10-CM

## 2013-07-05 DIAGNOSIS — J45909 Unspecified asthma, uncomplicated: Secondary | ICD-10-CM | POA: Insufficient documentation

## 2013-07-05 DIAGNOSIS — Z882 Allergy status to sulfonamides status: Secondary | ICD-10-CM

## 2013-07-05 DIAGNOSIS — S79929A Unspecified injury of unspecified thigh, initial encounter: Secondary | ICD-10-CM

## 2013-07-05 DIAGNOSIS — F10920 Alcohol use, unspecified with intoxication, uncomplicated: Secondary | ICD-10-CM

## 2013-07-05 LAB — BASIC METABOLIC PANEL
Anion gap: 14 (ref 5–15)
BUN: 7 mg/dL (ref 6–23)
CO2: 20 mEq/L (ref 19–32)
Calcium: 8.3 mg/dL — ABNORMAL LOW (ref 8.4–10.5)
Chloride: 99 mEq/L (ref 96–112)
Creatinine, Ser: 0.83 mg/dL (ref 0.50–1.35)
GFR calc Af Amer: >90 mL/min (ref >90)
Glucose, Bld: 87 mg/dL (ref 70–99)
POTASSIUM: 3.7 meq/L (ref 3.7–5.3)
Sodium: 133 mEq/L — ABNORMAL LOW (ref 137–147)

## 2013-07-05 LAB — CBC
HEMATOCRIT: 38 % — AB (ref 39.0–52.0)
Hemoglobin: 13.5 g/dL (ref 13.0–17.0)
MCH: 32.8 pg (ref 26.0–34.0)
MCHC: 35.5 g/dL (ref 30.0–36.0)
MCV: 92.5 fL (ref 78.0–100.0)
Platelets: 280 10*3/uL (ref 150–400)
RBC: 4.11 MIL/uL — ABNORMAL LOW (ref 4.22–5.81)
RDW: 13.3 % (ref 11.5–15.5)
WBC: 9.9 10*3/uL (ref 4.0–10.5)

## 2013-07-05 LAB — RAPID URINE DRUG SCREEN, HOSP PERFORMED
AMPHETAMINES: NOT DETECTED
Barbiturates: NOT DETECTED
Benzodiazepines: NOT DETECTED
COCAINE: NOT DETECTED
Opiates: NOT DETECTED
Tetrahydrocannabinol: POSITIVE — AB

## 2013-07-05 LAB — ETHANOL: Alcohol, Ethyl (B): 206 mg/dL — ABNORMAL HIGH (ref 0–11)

## 2013-07-05 MED ORDER — SODIUM CHLORIDE 0.9 % IV BOLUS (SEPSIS)
1000.0000 mL | Freq: Once | INTRAVENOUS | Status: AC
  Administered 2013-07-05: 1000 mL via INTRAVENOUS

## 2013-07-05 NOTE — ED Notes (Signed)
Patient repeatedly calling out requesting to have C collar removed. Patient reminded he can not take the collar off until the dr says that it is ok.

## 2013-07-05 NOTE — ED Notes (Signed)
PA notified patient is standing in doorway of treatment requesting to see doctor and demanding a bus ticket. PA at bedside

## 2013-07-05 NOTE — ED Notes (Signed)
Per EMS- fell out in the street onto concrete. C/o neck pain and 10/10 right hip pain. Has been drinking all day (four 40s malt liquor). A&Ox4. C/o dizziness. 18 G PIV placed left FA with 500 cc NS given. VS: BP 104/44 HR 88 CBG 88 SpO2 98% on 2 L [place] . Hx smoking.

## 2013-07-05 NOTE — ED Notes (Signed)
Bed: WA15 Expected date:  Expected time:  Means of arrival:  Comments: EMS  

## 2013-07-05 NOTE — ED Notes (Signed)
Pt A&Ox4. Moving all extremities. C-collar in place. States "I think I hit my head today." No LOC. Left 18G PIV flushed and patent. Able to speak clear, full sentences. Takes Paxil, Haldol, Cogentin. [place] last Friday d/t alcohol intoxication. Has been treated for alcoholism in the past but "just gave up." In NAD. Awaiting MD.

## 2013-07-05 NOTE — ED Notes (Signed)
Patient removed C collar after being reminded repeatedly to leave C Collar on.

## 2013-07-05 NOTE — ED Notes (Signed)
Pt declines any further treatment

## 2013-07-05 NOTE — ED Notes (Signed)
Walked past patients room, found patient sitting in the stool, patient had D/C'd his IV and the fluids were leaking into the stretcher. Charge nurse notified.

## 2013-07-05 NOTE — ED Provider Notes (Signed)
CSN: [id number]     Arrival date & time 07/05/13  2009 History   First MD Initiated Contact with Patient 07/05/13 2016     Chief Complaint  Patient presents with  . Fall  . Alcohol Intoxication  . Hip Pain     (Consider location/radiation/quality/duration/timing/severity/associated sxs/prior Treatment) The history is provided by the patient. No language interpreter was used.  Chase Blackwell is a 48 y/o M with PMHx of HTN, asthma, chronic pain, schizophrenia, and alcohol abuse presenting to the ED intoxicated and having a fall leading to right hip pain - reported that he fell down 4 steps on concrete and stated that he does not know if he hit his head. Stated that he drank approximately two 40's today. Patient reported that he had a fall earlier today where he landed on his right hip and is now having shooting pain from his right hip to his lower back. Patient reported that he has been having mild neck pain - "a little bit." Stated that he has been seen in the ED setting for his right hip pain that has been ongoing for one week and reported that he would like to get pain medications for this - stated that he was discharged with pain medications the last time. Stated that he has been having shortness of breath secondary to his bronchitis and stated that he does not have his albuterol - reported that he ran out. Denied chest pain, difficulty breathing, blurred vision, sudden loss of vision, abdominal pain, nausea, vomiting, diarrhea, weakness, numbness, tingling, loss of sensation, AVH, depression, suicidal ideation, homicidal ideation. PCP none  Past Medical History  Diagnosis Date  . Hypertension   . Asthma   . GERD (gastroesophageal reflux disease)   . Chronic pain   . Schizophrenia   . Alcohol abuse    History reviewed. No pertinent past surgical history. Family History  Problem Relation Age of Onset  . Malignant hyperthermia Mother   . Cirrhosis Father   . Alcohol abuse Father     History  Substance Use Topics  . Smoking status: Current Every Day Smoker -- 1.00 packs/day    Types: Cigarettes  . Smokeless tobacco: Not on file  . Alcohol Use: 1.2 oz/week    2 Cans of beer per week     Comment: 1 400 oz per week    Review of Systems  Respiratory: Positive for shortness of breath. Negative for chest tightness.   Cardiovascular: Negative for chest pain.  Gastrointestinal: Negative for nausea, vomiting and abdominal pain.  Musculoskeletal: Positive for arthralgias (Right hip) and neck pain. Negative for back pain.      Allergies  Cetirizine & related; Hctz; Hydroxyzine; and Sulfonamide derivatives  Home Medications   Prior to Admission medications   Medication Sig Start Date End Date Taking? Authorizing Provider  acetaminophen (TYLENOL) 325 MG tablet Take 650 mg by mouth every 6 (six) hours as needed.    Historical Provider, MD  albuterol (PROVENTIL HFA;VENTOLIN HFA) 108 (90 BASE) MCG/ACT inhaler Inhale 2 puffs into the lungs every 6 (six) hours as needed. Wheezing or shortness of breath    Historical Provider, MD  albuterol (PROVENTIL HFA;VENTOLIN HFA) 108 (90 BASE) MCG/ACT inhaler Inhale 1-2 puffs into the lungs every 4 (four) hours as needed for wheezing or shortness of breath. 06/27/13   Elmyra Ricks Pisciotta, PA-C  benztropine (COGENTIN) 0.5 MG tablet Take 0.5 mg by mouth daily.     Historical Provider, MD  haloperidol decanoate (HALDOL DECANOATE) 100  MG/ML injection Inject 100 mg into the muscle every 28 (twenty-eight) days.    Historical Provider, MD  HYDROcodone-acetaminophen (NORCO/VICODIN) 5-325 MG per tablet Take 1-2 tablets by mouth every 6 hours as needed for pain. 06/27/13   Nicole Pisciotta, PA-C  niacin (NIASPAN) 500 MG CR tablet Take 500 mg by mouth at bedtime.    Historical Provider, MD  PARoxetine (PAXIL) 40 MG tablet Take 40 mg by mouth every morning.    Historical Provider, MD  rosuvastatin (CRESTOR) 20 MG tablet Take 20 mg by mouth daily.     Historical Provider, MD  traZODone (DESYREL) 100 MG tablet Take 100 mg by mouth at bedtime.    Historical Provider, MD   BP 91/58  Pulse 93  Temp(Src) 98.6 F (37 C) (Oral)  Resp 16  SpO2 98% Physical Exam  Nursing note and vitals reviewed. Constitutional: He is oriented to person, place, and time. He appears well-nourished. No distress.  Patient appears disheveled and smells of alcohol   HENT:  Head: Normocephalic and atraumatic.  Mouth/Throat: Oropharynx is clear and moist. No oropharyngeal exudate.  Negative facial trauma noted Negative depressions or crepitus noted upon palpation to the skull   Eyes: Conjunctivae and EOM are normal. Pupils are equal, round, and reactive to light. Right eye exhibits no discharge. Left eye exhibits no discharge.  Negative nystagmus Visual fields grossly intact  Neck: Normal range of motion. Neck supple. No tracheal deviation present.  Negative neck stiffness Negative nuchal rigidity Negative pain upon palpation to the c-spine  Cardiovascular: Normal rate, regular rhythm and normal heart sounds.  Exam reveals no friction rub.   No murmur heard. Pulses:      Radial pulses are 2+ on the right side, and 2+ on the left side.       Dorsalis pedis pulses are 2+ on the right side, and 2+ on the left side.  Pulmonary/Chest: Effort normal. No respiratory distress. He has wheezes. He has no rales.  Patient is able to speak in full sentences without difficulty  Negative use of accessory muscles Negative stridor Inspiratory wheezes noted upper and lower lobes bilaterally   Musculoskeletal: Normal range of motion. He exhibits tenderness. He exhibits no edema.  Negative deformities noted to the spine. Negative pain upon palpation to the spine.  Negative deformities noted to the right hip with pain upon palpation. Full ROM noted with negative crepitus upon palpation. Full ROM to upper and lower extremities without difficulty noted, negative ataxia noted.    Lymphadenopathy:    He has no cervical adenopathy.  Neurological: He is alert and oriented to person, place, and time. No cranial nerve deficit. He exhibits normal muscle tone. Coordination normal.  Cranial nerves III-XII grossly intact Strength 5+/5+ to upper and lower extremities bilaterally with resistance applied, equal distribution noted Equal grip strength Sensation intact Negative facial drooping Negative aphasia GCS 15 Negative arm drift Gait proper, proper balance - negative sway, negative drift, negative step-offs  Skin: Skin is warm and dry. No rash noted. He is not diaphoretic. No erythema.  Psychiatric: He has a normal mood and affect. His behavior is normal. Thought content normal.    ED Course  Procedures (including critical care time)  Results for orders placed during the hospital encounter of 07/05/13  CBC      Result Value Ref Range   WBC 9.9  4.0 - 10.5 K/uL   RBC 4.11 (*) 4.22 - 5.81 MIL/uL   Hemoglobin 13.5  13.0 - 17.0 g/dL  HCT 38.0 (*) 39.0 - 52.0 %   MCV 92.5  78.0 - 100.0 fL   MCH 32.8  26.0 - 34.0 pg   MCHC 35.5  30.0 - 36.0 g/dL   RDW 13.3  11.5 - 15.5 %   Platelets 280  150 - 400 K/uL  BASIC METABOLIC PANEL      Result Value Ref Range   Sodium 133 (*) 137 - 147 mEq/L   Potassium 3.7  3.7 - 5.3 mEq/L   Chloride 99  96 - 112 mEq/L   CO2 20  19 - 32 mEq/L   Glucose, Bld 87  70 - 99 mg/dL   BUN 7  6 - 23 mg/dL   Creatinine, Ser 0.83  0.50 - 1.35 mg/dL   Calcium 8.3 (*) 8.4 - 10.5 mg/dL   GFR calc non Af Amer >90  >90 mL/min   GFR calc Af Amer >90  >90 mL/min   Anion gap 14  5 - 15  ETHANOL      Result Value Ref Range   Alcohol, Ethyl (B) 206 (*) 0 - 11 mg/dL  URINE RAPID DRUG SCREEN (HOSP PERFORMED)      Result Value Ref Range   Opiates NONE DETECTED  NONE DETECTED   Cocaine NONE DETECTED  NONE DETECTED   Benzodiazepines NONE DETECTED  NONE DETECTED   Amphetamines NONE DETECTED  NONE DETECTED   Tetrahydrocannabinol POSITIVE (*) NONE  DETECTED   Barbiturates NONE DETECTED  NONE DETECTED    Labs Review Labs Reviewed  CBC - Abnormal; Notable for the following:    RBC 4.11 (*)    HCT 38.0 (*)    All other components within normal limits  BASIC METABOLIC PANEL - Abnormal; Notable for the following:    Sodium 133 (*)    Calcium 8.3 (*)    All other components within normal limits  ETHANOL - Abnormal; Notable for the following:    Alcohol, Ethyl (B) 206 (*)    All other components within normal limits  URINE RAPID DRUG SCREEN (HOSP PERFORMED) - Abnormal; Notable for the following:    Tetrahydrocannabinol POSITIVE (*)    All other components within normal limits  TROPONIN I  HEPATIC FUNCTION PANEL    Imaging Review No results found.   EKG Interpretation None      MDM   Final diagnoses:  Alcohol abuse  Alcohol intoxication, uncomplicated  Right hip pain    Medications  sodium chloride 0.9 % bolus 1,000 mL (0 mLs Intravenous Stopped 07/05/13 2142)   Filed Vitals:   07/05/13 2010 07/05/13 2011 07/05/13 2015  BP: 91/58 91/58   Pulse: 89 89 93  Temp:  98.6 F (37 C)   TempSrc:  Oral   Resp:  16   SpO2: 98% 100% 98%   CBC negative elevated white blood cell count. BMP negative findings. Urine drug screen positive for cannabis. Alcohol 206. Patient started on IV fluids. Imaging to be obtained of CT head, CT cervical spine, right hip and lumbar spine.  10:13 PM This provider was made aware that patient left AMA. Patient was reported to have taken out his own IV. As per nurse, reported that she tried to speak with the patient regarding imaging to be performed. Patient declined and did not want imaging - reported that would like pain medications. As per nurse, reported that patient ambulated out of the ED and would not come back. Patient left AMA.   Jamse Mead, PA-C 07/06/13 [phone]

## 2013-07-06 ENCOUNTER — Inpatient Hospital Stay (HOSPITAL_COMMUNITY)
Admission: EM | Admit: 2013-07-06 | Discharge: 2013-07-23 | DRG: 003 | Disposition: A | Discharge status: Other Acute Inpatient Hospital | Payer: Medicare Other | Attending: Pulmonary Disease | Admitting: Pulmonary Disease

## 2013-07-06 ENCOUNTER — Encounter (HOSPITAL_COMMUNITY)
Admission: EM | Disposition: A | Discharge status: Nursing Home (Includes ICF) | Payer: Medicare Other | Source: Home / Self Care | Attending: Pulmonary Disease

## 2013-07-06 ENCOUNTER — Emergency Department (HOSPITAL_COMMUNITY): Payer: Medicare Other

## 2013-07-06 ENCOUNTER — Ambulatory Visit (HOSPITAL_COMMUNITY): Admit: 2013-07-06 | Payer: Self-pay | Admitting: Cardiology

## 2013-07-06 DIAGNOSIS — I10 Essential (primary) hypertension: Secondary | ICD-10-CM | POA: Diagnosis present

## 2013-07-06 DIAGNOSIS — E876 Hypokalemia: Secondary | ICD-10-CM | POA: Diagnosis not present

## 2013-07-06 DIAGNOSIS — Z8674 Personal history of sudden cardiac arrest: Secondary | ICD-10-CM

## 2013-07-06 DIAGNOSIS — I469 Cardiac arrest, cause unspecified: Secondary | ICD-10-CM | POA: Diagnosis present

## 2013-07-06 DIAGNOSIS — I498 Other specified cardiac arrhythmias: Secondary | ICD-10-CM | POA: Diagnosis not present

## 2013-07-06 DIAGNOSIS — F1010 Alcohol abuse, uncomplicated: Secondary | ICD-10-CM

## 2013-07-06 DIAGNOSIS — J9601 Acute respiratory failure with hypoxia: Secondary | ICD-10-CM

## 2013-07-06 DIAGNOSIS — I2582 Chronic total occlusion of coronary artery: Secondary | ICD-10-CM | POA: Diagnosis present

## 2013-07-06 DIAGNOSIS — I2119 ST elevation (STEMI) myocardial infarction involving other coronary artery of inferior wall: Secondary | ICD-10-CM | POA: Diagnosis present

## 2013-07-06 DIAGNOSIS — J40 Bronchitis, not specified as acute or chronic: Secondary | ICD-10-CM | POA: Diagnosis present

## 2013-07-06 DIAGNOSIS — K625 Hemorrhage of anus and rectum: Secondary | ICD-10-CM

## 2013-07-06 DIAGNOSIS — E8779 Other fluid overload: Secondary | ICD-10-CM | POA: Diagnosis not present

## 2013-07-06 DIAGNOSIS — J9600 Acute respiratory failure, unspecified whether with hypoxia or hypercapnia: Secondary | ICD-10-CM | POA: Diagnosis present

## 2013-07-06 DIAGNOSIS — R34 Anuria and oliguria: Secondary | ICD-10-CM | POA: Diagnosis not present

## 2013-07-06 DIAGNOSIS — E870 Hyperosmolality and hypernatremia: Secondary | ICD-10-CM

## 2013-07-06 DIAGNOSIS — R4182 Altered mental status, unspecified: Secondary | ICD-10-CM

## 2013-07-06 DIAGNOSIS — G40909 Epilepsy, unspecified, not intractable, without status epilepticus: Secondary | ICD-10-CM | POA: Diagnosis present

## 2013-07-06 DIAGNOSIS — K219 Gastro-esophageal reflux disease without esophagitis: Secondary | ICD-10-CM | POA: Diagnosis present

## 2013-07-06 DIAGNOSIS — G929 Unspecified toxic encephalopathy: Secondary | ICD-10-CM | POA: Diagnosis present

## 2013-07-06 DIAGNOSIS — G894 Chronic pain syndrome: Secondary | ICD-10-CM | POA: Diagnosis present

## 2013-07-06 DIAGNOSIS — J45909 Unspecified asthma, uncomplicated: Secondary | ICD-10-CM | POA: Diagnosis present

## 2013-07-06 DIAGNOSIS — E7800 Pure hypercholesterolemia, unspecified: Secondary | ICD-10-CM

## 2013-07-06 DIAGNOSIS — G92 Toxic encephalopathy: Secondary | ICD-10-CM | POA: Diagnosis present

## 2013-07-06 DIAGNOSIS — F200 Paranoid schizophrenia: Secondary | ICD-10-CM

## 2013-07-06 DIAGNOSIS — I4901 Ventricular fibrillation: Secondary | ICD-10-CM | POA: Diagnosis present

## 2013-07-06 DIAGNOSIS — G931 Anoxic brain damage, not elsewhere classified: Secondary | ICD-10-CM | POA: Diagnosis present

## 2013-07-06 DIAGNOSIS — F329 Major depressive disorder, single episode, unspecified: Secondary | ICD-10-CM

## 2013-07-06 DIAGNOSIS — R40243 Glasgow coma scale score 3-8, unspecified time: Secondary | ICD-10-CM

## 2013-07-06 DIAGNOSIS — F1210 Cannabis abuse, uncomplicated: Secondary | ICD-10-CM | POA: Diagnosis present

## 2013-07-06 DIAGNOSIS — E46 Unspecified protein-calorie malnutrition: Secondary | ICD-10-CM | POA: Diagnosis present

## 2013-07-06 DIAGNOSIS — E162 Hypoglycemia, unspecified: Secondary | ICD-10-CM | POA: Diagnosis present

## 2013-07-06 DIAGNOSIS — R40242 Glasgow coma scale score 9-12, unspecified time: Secondary | ICD-10-CM

## 2013-07-06 DIAGNOSIS — J690 Pneumonitis due to inhalation of food and vomit: Secondary | ICD-10-CM | POA: Diagnosis not present

## 2013-07-06 DIAGNOSIS — F259 Schizoaffective disorder, unspecified: Secondary | ICD-10-CM | POA: Diagnosis present

## 2013-07-06 DIAGNOSIS — Z66 Do not resuscitate: Secondary | ICD-10-CM | POA: Diagnosis not present

## 2013-07-06 DIAGNOSIS — F17200 Nicotine dependence, unspecified, uncomplicated: Secondary | ICD-10-CM | POA: Diagnosis present

## 2013-07-06 DIAGNOSIS — F3289 Other specified depressive episodes: Secondary | ICD-10-CM

## 2013-07-06 DIAGNOSIS — J95811 Postprocedural pneumothorax: Secondary | ICD-10-CM

## 2013-07-06 DIAGNOSIS — F1910 Other psychoactive substance abuse, uncomplicated: Secondary | ICD-10-CM

## 2013-07-06 DIAGNOSIS — R40244 Other coma, without documented Glasgow coma scale score, or with partial score reported, unspecified time: Secondary | ICD-10-CM

## 2013-07-06 HISTORY — PX: PERCUTANEOUS CORONARY STENT INTERVENTION (PCI-S): SHX5485

## 2013-07-06 HISTORY — PX: LEFT HEART CATHETERIZATION WITH CORONARY ANGIOGRAM: SHX5451

## 2013-07-06 LAB — BASIC METABOLIC PANEL
Anion gap: 25 — ABNORMAL HIGH (ref 5–15)
BUN: 10 mg/dL (ref 6–23)
CO2: 16 mEq/L — ABNORMAL LOW (ref 19–32)
CREATININE: 0.85 mg/dL (ref 0.50–1.35)
Calcium: 8 mg/dL — ABNORMAL LOW (ref 8.4–10.5)
Chloride: 93 mEq/L — ABNORMAL LOW (ref 96–112)
Glucose, Bld: 230 mg/dL — ABNORMAL HIGH (ref 70–99)
POTASSIUM: 4 meq/L (ref 3.7–5.3)
Sodium: 134 mEq/L — ABNORMAL LOW (ref 137–147)

## 2013-07-06 LAB — I-STAT ARTERIAL BLOOD GAS, ED
ACID-BASE DEFICIT: 9 mmol/L — AB (ref 0.0–2.0)
BICARBONATE: 18.3 meq/L — AB (ref 20.0–24.0)
O2 SAT: 95 %
PO2 ART: 89 mmHg (ref 80.0–100.0)
Patient temperature: 98.6
TCO2: 20 mmol/L (ref 0–100)
pCO2 arterial: 41.9 mmHg (ref 35.0–45.0)
pH, Arterial: 7.25 — ABNORMAL LOW (ref 7.350–7.450)

## 2013-07-06 LAB — CBC
HCT: 40.6 % (ref 39.0–52.0)
Hemoglobin: 13.9 g/dL (ref 13.0–17.0)
MCH: 32.7 pg (ref 26.0–34.0)
MCHC: 34.2 g/dL (ref 30.0–36.0)
MCV: 95.5 fL (ref 78.0–100.0)
PLATELETS: 288 10*3/uL (ref 150–400)
RBC: 4.25 MIL/uL (ref 4.22–5.81)
RDW: 13.7 % (ref 11.5–15.5)
WBC: 15.2 10*3/uL — AB (ref 4.0–10.5)

## 2013-07-06 LAB — HEPATIC FUNCTION PANEL
ALBUMIN: 3.5 g/dL (ref 3.5–5.2)
ALK PHOS: 63 U/L (ref 39–117)
ALT: 30 U/L (ref 0–53)
AST: 60 U/L — ABNORMAL HIGH (ref 0–37)
BILIRUBIN TOTAL: 0.2 mg/dL — AB (ref 0.3–1.2)
Bilirubin, Direct: <0.2 mg/dL (ref 0.0–0.3)
Total Protein: 6.3 g/dL (ref 6.0–8.3)

## 2013-07-06 LAB — TROPONIN I: Troponin I: 0.59 ng/mL (ref <0.30)

## 2013-07-06 LAB — PROTIME-INR
INR: 1.06 (ref 0.00–1.49)
Prothrombin Time: 13.8 seconds (ref 11.6–15.2)

## 2013-07-06 LAB — ETHANOL: Alcohol, Ethyl (B): <11 mg/dL (ref 0–11)

## 2013-07-06 LAB — APTT: aPTT: 30 seconds (ref 24–37)

## 2013-07-06 SURGERY — LEFT HEART CATHETERIZATION WITH CORONARY ANGIOGRAM
Anesthesia: LOCAL

## 2013-07-06 MED ORDER — MIDAZOLAM BOLUS VIA INFUSION
2.0000 mg | INTRAVENOUS | Status: DC | PRN
  Administered 2013-07-09 (×2): 2 mg via INTRAVENOUS
  Filled 2013-07-06: qty 2

## 2013-07-06 MED ORDER — FENTANYL CITRATE 0.05 MG/ML IJ SOLN
INTRAMUSCULAR | Status: AC
  Filled 2013-07-06: qty 2

## 2013-07-06 MED ORDER — MIDAZOLAM HCL 2 MG/2ML IJ SOLN: 2.0000 mg | Freq: Once | INTRAMUSCULAR | Status: AC | PRN

## 2013-07-06 MED ORDER — MIDAZOLAM HCL 2 MG/2ML IJ SOLN: 2.0000 mg | Freq: Once | INTRAMUSCULAR | Status: AC

## 2013-07-06 MED ORDER — SODIUM CHLORIDE 0.9 % IV SOLN: 1.0000 ug/kg/min | INTRAVENOUS | Status: DC

## 2013-07-06 MED ORDER — ASPIRIN 300 MG RE SUPP
300.0000 mg | Freq: Once | RECTAL | Status: AC
  Administered 2013-07-06: 300 mg via RECTAL

## 2013-07-06 MED ORDER — NOREPINEPHRINE BITARTRATE 1 MG/ML IV SOLN
0.5000 ug/min | INTRAVENOUS | Status: DC
  Administered 2013-07-07: 0.5 ug/min via INTRAVENOUS
  Administered 2013-07-09: 8 ug/min via INTRAVENOUS
  Administered 2013-07-09: 5 ug/min via INTRAVENOUS
  Administered 2013-07-09: 8 ug/min via INTRAVENOUS
  Filled 2013-07-06 (×6): qty 4

## 2013-07-06 MED ORDER — SODIUM CHLORIDE 0.9 % IV SOLN
25.0000 ug/h | INTRAVENOUS | Status: DC
  Administered 2013-07-06: 50 ug/h via INTRAVENOUS
  Filled 2013-07-06 (×2): qty 50

## 2013-07-06 MED ORDER — SODIUM CHLORIDE 0.9 % IV SOLN
2000.0000 mL | Freq: Once | INTRAVENOUS | Status: AC
  Administered 2013-07-06: 2000 mL via INTRAVENOUS

## 2013-07-06 MED ORDER — TIROFIBAN HCL IV 5 MG/100ML
0.1500 ug/kg/min | INTRAVENOUS | Status: AC
  Filled 2013-07-06 (×3): qty 100

## 2013-07-06 MED ORDER — TIROFIBAN (AGGRASTAT) BOLUS VIA INFUSION
25.0000 ug/kg | Freq: Once | INTRAVENOUS | Status: AC
  Filled 2013-07-06: qty 42

## 2013-07-06 MED ORDER — FOLIC ACID 1 MG PO TABS
1.0000 mg | ORAL_TABLET | Freq: Every day | ORAL | Status: DC
Start: 2013-07-07 — End: 2013-07-07
  Filled 2013-07-06 (×2): qty 1

## 2013-07-06 MED ORDER — SODIUM CHLORIDE 0.9 % IV SOLN: INTRAVENOUS | Status: AC

## 2013-07-06 MED ORDER — SODIUM CHLORIDE 0.9 % IV SOLN
1.0000 ug/kg/min | INTRAVENOUS | Status: DC
  Administered 2013-07-06: 1 ug/kg/min via INTRAVENOUS
  Administered 2013-07-08: 1.25 ug/kg/min via INTRAVENOUS
  Filled 2013-07-06 (×2): qty 20

## 2013-07-06 MED ORDER — SODIUM CHLORIDE 0.9 % IV SOLN: 2000.0000 mL | Freq: Once | INTRAVENOUS | Status: DC

## 2013-07-06 MED ORDER — HEPARIN SODIUM (PORCINE) 5000 UNIT/ML IJ SOLN
INTRAMUSCULAR | Status: AC
  Administered 2013-07-06: 4000 [IU]
  Filled 2013-07-06: qty 1

## 2013-07-06 MED ORDER — ACETAMINOPHEN 325 MG PO TABS: 650.0000 mg | ORAL_TABLET | ORAL | Status: DC | PRN

## 2013-07-06 MED ORDER — TIROFIBAN HCL IV 5 MG/100ML
INTRAVENOUS | Status: AC
  Filled 2013-07-06: qty 100

## 2013-07-06 MED ORDER — TICAGRELOR 90 MG PO TABS
ORAL_TABLET | ORAL | Status: AC
  Filled 2013-07-06: qty 2

## 2013-07-06 MED ORDER — MIDAZOLAM HCL 2 MG/2ML IJ SOLN
INTRAMUSCULAR | Status: AC
  Filled 2013-07-06: qty 2

## 2013-07-06 MED ORDER — ONDANSETRON HCL 4 MG/2ML IJ SOLN: 4.0000 mg | Freq: Four times a day (QID) | INTRAMUSCULAR | Status: DC | PRN

## 2013-07-06 MED ORDER — TICAGRELOR 90 MG PO TABS
90.0000 mg | ORAL_TABLET | Freq: Two times a day (BID) | ORAL | Status: DC
  Administered 2013-07-07 – 2013-07-23 (×33): 90 mg via ORAL
  Filled 2013-07-06 (×34): qty 1

## 2013-07-06 MED ORDER — ADULT MULTIVITAMIN W/MINERALS CH
1.0000 | ORAL_TABLET | Freq: Every day | ORAL | Status: DC
  Administered 2013-07-07 – 2013-07-12 (×6): 1 via ORAL
  Filled 2013-07-06 (×7): qty 1

## 2013-07-06 MED ORDER — BIVALIRUDIN 250 MG IV SOLR
INTRAVENOUS | Status: AC
  Filled 2013-07-06: qty 250

## 2013-07-06 MED ORDER — CISATRACURIUM BOLUS VIA INFUSION
0.0500 mg/kg | INTRAVENOUS | Status: DC | PRN
  Filled 2013-07-06: qty 5

## 2013-07-06 MED ORDER — FENTANYL BOLUS VIA INFUSION
50.0000 ug | INTRAVENOUS | Status: DC | PRN
  Filled 2013-07-06: qty 50

## 2013-07-06 MED ORDER — NITROGLYCERIN 0.2 MG/ML ON CALL CATH LAB
INTRAVENOUS | Status: AC
  Filled 2013-07-06: qty 1

## 2013-07-06 MED ORDER — SODIUM CHLORIDE 0.9 % IV SOLN
1.7500 mg/kg/h | INTRAVENOUS | Status: DC
  Filled 2013-07-06: qty 250

## 2013-07-06 MED ORDER — CISATRACURIUM BOLUS VIA INFUSION
0.1000 mg/kg | Freq: Once | INTRAVENOUS | Status: AC
  Filled 2013-07-06: qty 9

## 2013-07-06 MED ORDER — HEPARIN (PORCINE) IN NACL 2-0.9 UNIT/ML-% IJ SOLN
INTRAMUSCULAR | Status: AC
  Filled 2013-07-06: qty 1500

## 2013-07-06 MED ORDER — LIDOCAINE HCL (PF) 1 % IJ SOLN
INTRAMUSCULAR | Status: AC
  Filled 2013-07-06: qty 30

## 2013-07-06 MED ORDER — NITROGLYCERIN IN D5W 200-5 MCG/ML-% IV SOLN: 5.0000 ug/min | INTRAVENOUS | Status: DC

## 2013-07-06 MED ORDER — SODIUM CHLORIDE 0.9 % IV SOLN
1.0000 mg/h | INTRAVENOUS | Status: DC
  Administered 2013-07-06: 2 mg/h via INTRAVENOUS
  Administered 2013-07-07 – 2013-07-08 (×6): 7 mg/h via INTRAVENOUS
  Administered 2013-07-09: 5 mg/h via INTRAVENOUS
  Administered 2013-07-09: 6 mg/h via INTRAVENOUS
  Administered 2013-07-09: 7 mg/h via INTRAVENOUS
  Administered 2013-07-11 – 2013-07-12 (×3): 6 mg/h via INTRAVENOUS
  Filled 2013-07-06 (×18): qty 10

## 2013-07-06 MED ORDER — CISATRACURIUM BOLUS VIA INFUSION: 0.0500 mg/kg | INTRAVENOUS | Status: DC | PRN

## 2013-07-06 MED ORDER — CISATRACURIUM BOLUS VIA INFUSION: 0.1000 mg/kg | Freq: Once | INTRAVENOUS | Status: DC

## 2013-07-06 MED ORDER — FENTANYL CITRATE 0.05 MG/ML IJ SOLN: 100.0000 ug | Freq: Once | INTRAMUSCULAR | Status: AC

## 2013-07-06 MED ORDER — FENTANYL CITRATE 0.05 MG/ML IJ SOLN: 100.0000 ug | Freq: Once | INTRAMUSCULAR | Status: AC | PRN

## 2013-07-06 MED ORDER — ASPIRIN 81 MG PO CHEW
81.0000 mg | CHEWABLE_TABLET | Freq: Every day | ORAL | Status: DC
  Administered 2013-07-07 – 2013-07-23 (×16): 81 mg via ORAL
  Filled 2013-07-06 (×16): qty 1

## 2013-07-06 MED ORDER — VITAMIN B-1 100 MG PO TABS
100.0000 mg | ORAL_TABLET | Freq: Every day | ORAL | Status: DC
  Filled 2013-07-06 (×2): qty 1

## 2013-07-06 NOTE — ED Notes (Signed)
Critical care at bedside  

## 2013-07-06 NOTE — Progress Notes (Signed)
Unit CM UR Completed by [facility] ED CM  W. Charliegh Vasudevan RN  

## 2013-07-06 NOTE — Consult Note (Signed)
Reason for Consult: Acute inferior wall myocardial infarction/V. fib cardiac arrest Referring Physician: CCM  Mcguire L Hovatter is an 48 y.o. male.  HPI: Patient is 48 year old male with past medical history significant for hypertension history of bronchial asthma GERD, history of schizoaffective disorder, alcohol abuse, chronic pain syndrome, was found by the neighbor on street unresponsive EMS was called EKG recognize shockable rhythm requiring defibrillation x3 and had CPR approximately 15 minutes EKG done showed normal sinus rhythm with right bundle branch block with left posterior fascicular block and ST elevation in inferior leads. Code STEMI was called patient presently intubated unresponsive and this brought emergently to the Cath Lab for emergency PCI no family member is available.  Past Medical History  Diagnosis Date  . Hypertension   . Asthma   . GERD (gastroesophageal reflux disease)   . Chronic pain   . Schizophrenia   . Alcohol abuse     No past surgical history on file.  Family History  Problem Relation Age of Onset  . Malignant hyperthermia Mother   . Cirrhosis Father   . Alcohol abuse Father     Social History:  reports that he has been smoking Cigarettes.  He has been smoking about 1.00 pack per day. He does not have any smokeless tobacco history on file. He reports that he drinks about 1.2 ounces of alcohol per week. He reports that he uses illicit drugs (Marijuana).  Allergies:  Allergies  Allergen Reactions  . Cetirizine & Related     unknown  . Hctz [Hydrochlorothiazide] Other (See Comments)    Dizzy spells  . Hydroxyzine Hives  . Sulfonamide Derivatives Hives    Medications: I have reviewed the patient's current medications.  Results for orders placed during the hospital encounter of 07/06/13 (from the past 48 hour(s))  CBC     Status: Abnormal   Collection Time    07/06/13  9:45 PM      Result Value Ref Range   WBC 15.2 (*) 4.0 - 10.5 K/uL   RBC  4.25  4.22 - 5.81 MIL/uL   Hemoglobin 13.9  13.0 - 17.0 g/dL   HCT 40.6  39.0 - 52.0 %   MCV 95.5  78.0 - 100.0 fL   MCH 32.7  26.0 - 34.0 pg   MCHC 34.2  30.0 - 36.0 g/dL   RDW 13.7  11.5 - 15.5 %   Platelets 288  150 - 400 K/uL  APTT     Status: None   Collection Time    07/06/13  9:45 PM      Result Value Ref Range   aPTT 30  24 - 37 seconds  BASIC METABOLIC PANEL     Status: Abnormal   Collection Time    07/06/13  9:45 PM      Result Value Ref Range   Sodium 134 (*) 137 - 147 mEq/L   Potassium 4.0  3.7 - 5.3 mEq/L   Chloride 93 (*) 96 - 112 mEq/L   CO2 16 (*) 19 - 32 mEq/L   Glucose, Bld 230 (*) 70 - 99 mg/dL   BUN 10  6 - 23 mg/dL   Creatinine, Ser 0.85  0.50 - 1.35 mg/dL   Calcium 8.0 (*) 8.4 - 10.5 mg/dL   GFR calc non Af Amer >90  >90 mL/min   GFR calc Af Amer >90  >90 mL/min   Comment: (NOTE)     The eGFR has been calculated using the CKD EPI equation.  This calculation has not been validated in all clinical situations.     eGFR's persistently <90 mL/min signify possible Chronic Kidney     Disease.   Anion gap 25 (*) 5 - 15  PROTIME-INR     Status: None   Collection Time    07/06/13  9:45 PM      Result Value Ref Range   Prothrombin Time 13.8  11.6 - 15.2 seconds   INR 1.06  0.00 - 1.49  TROPONIN I     Status: Abnormal   Collection Time    07/06/13  9:45 PM      Result Value Ref Range   Troponin I 0.59 (*) <0.30 ng/mL   Comment:            Due to the release kinetics of cTnI,     a negative result within the first hours     of the onset of symptoms does not rule out     myocardial infarction with certainty.     If myocardial infarction is still suspected,     repeat the test at appropriate intervals.     CRITICAL RESULT CALLED TO, READ BACK BY AND VERIFIED WITH:     Donata Clay [id number] [place]  ETHANOL     Status: None   Collection Time    07/06/13  9:45 PM      Result Value Ref Range   Alcohol, Ethyl (B) <11  0 - 11 mg/dL   Comment:             LOWEST DETECTABLE LIMIT FOR     SERUM ALCOHOL IS 11 mg/dL     FOR MEDICAL PURPOSES ONLY  HEPATIC FUNCTION PANEL     Status: Abnormal   Collection Time    07/06/13  9:45 PM      Result Value Ref Range   Total Protein 6.3  6.0 - 8.3 g/dL   Albumin 3.5  3.5 - 5.2 g/dL   AST 60 (*) 0 - 37 U/L   ALT 30  0 - 53 U/L   Alkaline Phosphatase 63  39 - 117 U/L   Total Bilirubin 0.2 (*) 0.3 - 1.2 mg/dL   Bilirubin, Direct <0.2  0.0 - 0.3 mg/dL   Indirect Bilirubin NOT CALCULATED  0.3 - 0.9 mg/dL  I-STAT ARTERIAL BLOOD GAS, ED     Status: Abnormal   Collection Time    07/06/13  9:58 PM      Result Value Ref Range   pH, Arterial 7.250 (*) 7.350 - 7.450   pCO2 arterial 41.9  35.0 - 45.0 mmHg   pO2, Arterial 89.0  80.0 - 100.0 mmHg   Bicarbonate 18.3 (*) 20.0 - 24.0 mEq/L   TCO2 20  0 - 100 mmol/L   O2 Saturation 95.0     Acid-base deficit 9.0 (*) 0.0 - 2.0 mmol/L   Patient temperature 98.6 F     Collection site RADIAL, ALLEN'S TEST ACCEPTABLE     Drawn by RT     Sample type ARTERIAL      No results found.  Review of Systems  Unable to perform ROS: intubated   Blood pressure 121/77, pulse 84, resp. rate 15, height $RemoveBe'5\' 11"'[id number]$  (1.803 m), weight 83.462 kg (184 lb), SpO2 98.00%. Physical Exam  Eyes: Conjunctivae are normal. Left eye exhibits no discharge. No scleral icterus.  Neck: Normal range of motion. Neck supple. No JVD present. No thyromegaly present.  Cardiovascular: Normal rate and regular rhythm.  Murmur (soft S4 gallop noted) heard. Respiratory: Effort normal and breath sounds normal. No respiratory distress. He has no wheezes.  GI: Soft. Bowel sounds are normal. He exhibits no distension.  Musculoskeletal: He exhibits no edema.  Neurological:  Unresponsive sedated intubated    Assessment/Plan: Acute inferior wall myocardial infarction Status post V. fib cardiac arrest Acute respiratory failure secondary to above next line hypertension History of bronchial  asthma GERD History of schizoaffective disorder EtOH abuse Chronic pain syndrome Plan Patient will be taken emergently to the Cath Lab for emergency left cath possible PTCA stenting as medical emergency . No family member is available.  Kateryn Marasigan N 07/06/2013, 11:25 PM

## 2013-07-06 NOTE — ED Notes (Signed)
Cardiology at bedside.

## 2013-07-06 NOTE — ED Notes (Signed)
Dr. Harwani at bedside. 

## 2013-07-06 NOTE — ED Provider Notes (Signed)
48 year old male is brought in by EMS after successful resuscitation of out of hospital cardiac arrest. At 2045, 911 call came for an unresponsive patient. Fire rescue arrived at 2052 to begin CPR. AED gave 2 shocks. EMS arrived at 2107 had noted rhythm of ventricular fibrillation and he has received several additional shocks as well as amiodarone. He is now in a stable rhythm with adequate blood pressure but has not had return of consciousness. No other history is immediately available. On exam he is intubated but is making some respiratory efforts. Gaze is disconjugate. Pupils are 6 mm and sluggishly reactive. Fundi show no hemorrhage, exudate, or papilledema. Lungs are clear and heart has regular rate and rhythm. Abdomen is soft without masses. Abrasion is noted over the left elbow but no other extremity injury is seen. Intraosseous line is present in the right tibia. Neurologic: Glasgow Coma Scale=3. Initial ECG showed inferior wall myocardial infarction, so code STEMI was activated. He has also been started on cooling protocol. Rectal aspirin is given and heparin is given. Case has been discussed with Dr.Harwani, on call for cardiology STEMI, who is coming in to the ED to take him to the catheterization lab. Dr. Haroldine Laws has also arrived from critical care service.  CRITICAL CARE Performed by: Delora Fuel Total critical care time: 45 minutes Critical care time was exclusive of separately billable procedures and treating other patients. Critical care was necessary to treat or prevent imminent or life-threatening deterioration. Critical care was time spent personally by me on the following activities: development of treatment plan with patient and/or surrogate as well as nursing, discussions with consultants, evaluation of patient's response to treatment, examination of patient, obtaining history from patient or surrogate, ordering and performing treatments and interventions, ordering and review of  laboratory studies, ordering and review of radiographic studies, pulse oximetry and re-evaluation of patient's condition.  I saw and evaluated the patient, reviewed the resident's note and I agree with the findings and plan.   Date: 07/06/2013 at 2133  Rate: 107  Rhythm: sinus tachycardia  QRS Axis: normal  Intervals: normal  ST/T Wave abnormalities: ST elevations inferiorly  Conduction Disutrbances:right bundle branch block  Narrative Interpretation: STEMI involving inferior wall, right bundle branch block, sinus tachycardia. When compared with ECG of 06/28/2013, STEMI is now present, and (block is now present  Old EKG Reviewed: changes noted   Date: 07/06/2013 at 2153  Rate: 86  Rhythm: normal sinus rhythm  QRS Axis: normal  Intervals: normal  ST/T Wave abnormalities: ST elevations inferiorly  Conduction Disutrbances:right bundle branch block  Narrative Interpretation: Inferior wall STEMI with right bundle branch block. When compared with ECG of earlier today, no significant changes seen.  Old EKG Reviewed: unchanged      Delora Fuel, MD 53/29/92 [id number]

## 2013-07-06 NOTE — ED Notes (Signed)
To cath lab with RN and tech

## 2013-07-06 NOTE — CV Procedure (Signed)
Left cardiac cath/PTCA stenting report dictated on 07/06/2013 dictation number is [id number]

## 2013-07-06 NOTE — H&P (Addendum)
PULMONARY  / CRITICAL CARE MEDICINE  Name: Chase Blackwell MRN: [id number] DOB: 12-Dec-1965 PCP Default, Provider, MD   ADMISSION DATE:  07-11-2013 LOS 0 days    PRIMARY SERVICE: CCM  CHIEF COMPLAINT:  VF Arrest -> Inferior STEMI (Cooling Protocol)  CARDIOLOGY: Harwani/Kadakia  BRIEF PATIENT DESCRIPTION:   48 y/o male alcoholic presented to ER with VF arrest and inferior STEMI on 2022-07-12. Cooling protocol started and taken to cath lab.   LINES / TUBES: Pending  CULTURES: None  ANTIBIOTICS: None   SIGNIFICANT EVENTS / STUDIES:  07/12/22 Intubated in field 07-12-22 Taken to cath lab RCA occluded. Left system ok. S/p PCI 2022-07-12 Hypothermia protocol initiated    HISTORY OF PRESENT ILLNESS:    Chase Blackwell is a 48 y/o M with PMHx of HTN, asthma, chronic pain, schizophrenia, and alcohol abuse. Presented to ER 7/2 with hip pain after a fall and released.    On 07-12-22 found down on sidewalk. Fire Department responded and had shockable rhythm on AED. Shocked twice. EMS arrived and patient in VF. Shocked 3 more times and intubated. Given amio. CPR x 15 mins before ROSC. Cooling begun. In ER, sinus rhythm with inferior ST elevation. Taken emergently to cath lab with occluded mRCA.     PAST MEDICAL HISTORY :  Past Medical History  Diagnosis Date  . Hypertension   . Asthma   . GERD (gastroesophageal reflux disease)   . Chronic pain   . Schizophrenia   . Alcohol abuse      Family History  Problem Relation Age of Onset  . Malignant hyperthermia Mother   . Cirrhosis Father   . Alcohol abuse Father      History   Social History  . Marital Status: Single    Spouse Name: N/A    Number of Children: N/A  . Years of Education: N/A   Occupational History  . Not on file.   Social History Main Topics  . Smoking status: Current Every Day Smoker -- 1.00 packs/day    Types: Cigarettes  . Smokeless tobacco: Not on file  . Alcohol Use: 1.2 oz/week    2 Cans of beer per week      Comment: 1 400 oz per week  . Drug Use: Yes    Special: Marijuana  . Sexual Activity: No   Other Topics Concern  . Not on file   Social History Narrative  . No narrative on file     Allergies  Allergen Reactions  . Cetirizine & Related     unknown  . Hctz [Hydrochlorothiazide] Other (See Comments)    Dizzy spells  . Hydroxyzine Hives  . Sulfonamide Derivatives Hives      (Not in an outpatient encounter)   REVIEW OF SYSTEMS:  Unavailable due to patient intubated  SUBJECTIVE:   VITAL SIGNS: Filed Vitals:   11-Jul-2013 2137 07/11/2013 2145 07-11-13 2149 07-11-2013 2150  BP: 123/97 129/81  121/77  Pulse: 101 88  84  Resp:  15    Height:   5\' 11"  (1.803 m)   Weight:   83.462 kg (184 lb)   SpO2: 100% 86%  98%      HEMODYNAMICS:   VENTILATOR SETTINGS: Vent Mode:  [-] PRVC FiO2 (%):  [30 %] 30 % Set Rate:  [28 bmp] 28 bmp Vt Set:  [420 mL] 420 mL PEEP:  [5 cmH20] 5 cmH20 Plateau Pressure:  [4 [id number] cmH20] 20 cmH20 INTAKE / OUTPUT:      PHYSICAL  EXAMINATION: General:  Intubated. unkempt Neuro:  Intubated sedated HEENT:  Normal except for ETT and poor dentition Cardiovascular:  RRR no obvious murmurs or gallops Lungs:  Mild rhonchi Abdomen:  Soft. NT/ND good BS Extremities: No c/c/e. DPs 2+ bilaterally  Interosseous line Musculoskeletal:  Normal tone Skin:  No rash  ECG: SR 86 RBBB. LPFB  Inferior ST elevation  LABS: PULMONARY  Recent Labs Lab 07/06/13 2158  PHART 7.250*  PCO2ART 41.9  PO2ART 89.0  HCO3 18.3*  TCO2 20  O2SAT 95.0    CBC  Recent Labs Lab 07/05/13 2046 07/06/13 2145  HGB 13.5 13.9  HCT 38.0* 40.6  WBC 9.9 15.2*  PLT 280 288    COAGULATION No results found for this basename: INR,  in the last 168 hours  CARDIAC  No results found for this basename: TROPONINI,  in the last 168 hours No results found for this basename: PROBNP,  in the last 168 hours   CHEMISTRY  Recent Labs Lab 07/05/13 2046  NA 133*  K 3.7   CL 99  CO2 20  GLUCOSE 87  BUN 7  CREATININE 0.83  CALCIUM 8.3*   Estimated Creatinine Clearance: 117.2 ml/min (by C-G formula based on Cr of 0.83).   LIVER No results found for this basename: AST, ALT, ALKPHOS, BILITOT, PROT, ALBUMIN, INR,  in the last 168 hours   INFECTIOUS No results found for this basename: LATICACIDVEN, PROCALCITON,  in the last 168 hours   ENDOCRINE CBG (last 3)  No results found for this basename: GLUCAP,  in the last 72 hours       IMAGING x48h  No results found.     ASSESSMENT / PLAN:  PULMONARY A: Acute respiratory failure P:  Vent bundle      Wean as tolerated after hypothermia complete  CARDIOVASCULAR A: 1) Ventricular fibrillation arrest      2) Acute inferior STEM due to occluded RCA P: PCI of RCA per cardiology. Continue DAPT, ASA, Statin      Hypothermia protocol   GASTROINTESTINAL A:  Stress ulcer prophylaxis P:  PPI   NEUROLOGIC A:  ETOH abuse P:  Check ETOH level.       ETOH WD protocol once off hypothermia. Will give thiamine, folate and MVI down tube      Had 15 mins CPR. Will need assessment for anoxic brain injury once hypothermia complete.    GLOBAL A: Heparin for DVT prophylaxis    The patient is critically ill with multiple organ systems failure and requires high complexity decision making for assessment and support, frequent evaluation and titration of therapies, application of advanced monitoring technologies and extensive interpretation of multiple databases.   Critical Care Time devoted to patient care services described in this note is  40  Minutes.  [facility] 10:59 PM 07/06/2013 10:23 PM   Whil nurse pulling femoral sheath patient developed significant bleeding at R femoral arteriotomy site. I took over and held manual pressure x 20 minutes with good hemostasis.  Yida Hyams,MD 3:17 AM

## 2013-07-06 NOTE — ED Notes (Addendum)
Per EMS: Pt found by fire unresponsive in street. Unknown amount of down time. CPR started at 20:52. Fire Department delivered two shocks. On EMS arrival, pt found to be in V fib. 3 more shocks delivered. ROSC returned at 21:07. Pt then went to sinus tach rate 120's-150's. Pt given 450 amiodarone, 1 mg epi, 1L cold saline. After intubation, pt started to become restless. Pt given 2.5 Midazolam (21:17), 50 mcg Fentanyl (21:17). Intubated with [facility]. Pt in [place] on monitor on arrival to ED. CBG 204. 115/57. 120 ST. 27-30 CAP. GCS 3.

## 2013-07-07 ENCOUNTER — Inpatient Hospital Stay (HOSPITAL_COMMUNITY): Payer: Medicare Other

## 2013-07-07 DIAGNOSIS — J9600 Acute respiratory failure, unspecified whether with hypoxia or hypercapnia: Secondary | ICD-10-CM

## 2013-07-07 DIAGNOSIS — J95811 Postprocedural pneumothorax: Secondary | ICD-10-CM

## 2013-07-07 DIAGNOSIS — J40 Bronchitis, not specified as acute or chronic: Secondary | ICD-10-CM

## 2013-07-07 DIAGNOSIS — I469 Cardiac arrest, cause unspecified: Secondary | ICD-10-CM

## 2013-07-07 DIAGNOSIS — I2119 ST elevation (STEMI) myocardial infarction involving other coronary artery of inferior wall: Principal | ICD-10-CM

## 2013-07-07 DIAGNOSIS — F1010 Alcohol abuse, uncomplicated: Secondary | ICD-10-CM

## 2013-07-07 LAB — POCT I-STAT, CHEM 8
BUN: 10 mg/dL (ref 6–23)
BUN: 10 mg/dL (ref 6–23)
BUN: 11 mg/dL (ref 6–23)
BUN: 11 mg/dL (ref 6–23)
CALCIUM ION: 1.05 mmol/L — AB (ref 1.12–1.23)
CALCIUM ION: 1.05 mmol/L — AB (ref 1.12–1.23)
CHLORIDE: 105 meq/L (ref 96–112)
Calcium, Ion: 1.01 mmol/L — ABNORMAL LOW (ref 1.12–1.23)
Calcium, Ion: 1.05 mmol/L — ABNORMAL LOW (ref 1.12–1.23)
Chloride: 105 mEq/L (ref 96–112)
Chloride: 106 mEq/L (ref 96–112)
Chloride: 107 mEq/L (ref 96–112)
Creatinine, Ser: 0.4 mg/dL — ABNORMAL LOW (ref 0.50–1.35)
Creatinine, Ser: 0.4 mg/dL — ABNORMAL LOW (ref 0.50–1.35)
Creatinine, Ser: 0.5 mg/dL (ref 0.50–1.35)
Creatinine, Ser: 0.7 mg/dL (ref 0.50–1.35)
Glucose, Bld: 121 mg/dL — ABNORMAL HIGH (ref 70–99)
Glucose, Bld: 190 mg/dL — ABNORMAL HIGH (ref 70–99)
Glucose, Bld: 202 mg/dL — ABNORMAL HIGH (ref 70–99)
Glucose, Bld: 218 mg/dL — ABNORMAL HIGH (ref 70–99)
HCT: 43 % (ref 39.0–52.0)
HEMATOCRIT: 40 % (ref 39.0–52.0)
HEMATOCRIT: 41 % (ref 39.0–52.0)
HEMATOCRIT: 44 % (ref 39.0–52.0)
HEMOGLOBIN: 14.6 g/dL (ref 13.0–17.0)
HEMOGLOBIN: 13.6 g/dL (ref 13.0–17.0)
Hemoglobin: 13.9 g/dL (ref 13.0–17.0)
Hemoglobin: 15 g/dL (ref 13.0–17.0)
POTASSIUM: 3.7 meq/L (ref 3.7–5.3)
Potassium: 3.3 mEq/L — ABNORMAL LOW (ref 3.7–5.3)
Potassium: 3.6 mEq/L — ABNORMAL LOW (ref 3.7–5.3)
Potassium: 4.1 mEq/L (ref 3.7–5.3)
SODIUM: 139 meq/L (ref 137–147)
SODIUM: 139 meq/L (ref 137–147)
Sodium: 137 mEq/L (ref 137–147)
Sodium: 139 mEq/L (ref 137–147)
TCO2: 16 mmol/L (ref 0–100)
TCO2: 16 mmol/L (ref 0–100)
TCO2: 17 mmol/L (ref 0–100)
TCO2: 17 mmol/L (ref 0–100)

## 2013-07-07 LAB — GLUCOSE, CAPILLARY
GLUCOSE-CAPILLARY: 118 mg/dL — AB (ref 70–99)
GLUCOSE-CAPILLARY: 155 mg/dL — AB (ref 70–99)
GLUCOSE-CAPILLARY: 207 mg/dL — AB (ref 70–99)
GLUCOSE-CAPILLARY: 238 mg/dL — AB (ref 70–99)
GLUCOSE-CAPILLARY: 38 mg/dL — AB (ref 70–99)
GLUCOSE-CAPILLARY: 82 mg/dL (ref 70–99)
Glucose-Capillary: 198 mg/dL — ABNORMAL HIGH (ref 70–99)
Glucose-Capillary: 208 mg/dL — ABNORMAL HIGH (ref 70–99)
Glucose-Capillary: 198 mg/dL — ABNORMAL HIGH (ref 70–99)
Glucose-Capillary: 184 mg/dL — ABNORMAL HIGH (ref 70–99)
Glucose-Capillary: 160 mg/dL — ABNORMAL HIGH (ref 70–99)
Glucose-Capillary: 140 mg/dL — ABNORMAL HIGH (ref 70–99)
Glucose-Capillary: 87 mg/dL (ref 70–99)
Glucose-Capillary: 183 mg/dL — ABNORMAL HIGH (ref 70–99)
Glucose-Capillary: 158 mg/dL — ABNORMAL HIGH (ref 70–99)
Glucose-Capillary: 130 mg/dL — ABNORMAL HIGH (ref 70–99)
Glucose-Capillary: 97 mg/dL (ref 70–99)

## 2013-07-07 LAB — BASIC METABOLIC PANEL
ANION GAP: 15 (ref 5–15)
ANION GAP: 16 — AB (ref 5–15)
ANION GAP: 15 (ref 5–15)
BUN: 11 mg/dL (ref 6–23)
BUN: 10 mg/dL (ref 6–23)
BUN: 8 mg/dL (ref 6–23)
CALCIUM: 8 mg/dL — AB (ref 8.4–10.5)
CHLORIDE: 106 meq/L (ref 96–112)
CHLORIDE: 108 meq/L (ref 96–112)
CO2: 18 mEq/L — ABNORMAL LOW (ref 19–32)
CO2: 19 mEq/L (ref 19–32)
CO2: 18 mEq/L — ABNORMAL LOW (ref 19–32)
CREATININE: 0.49 mg/dL — AB (ref 0.50–1.35)
Calcium: 7.6 mg/dL — ABNORMAL LOW (ref 8.4–10.5)
Calcium: 8.3 mg/dL — ABNORMAL LOW (ref 8.4–10.5)
Chloride: 106 mEq/L (ref 96–112)
Creatinine, Ser: 0.49 mg/dL — ABNORMAL LOW (ref 0.50–1.35)
Creatinine, Ser: 0.42 mg/dL — ABNORMAL LOW (ref 0.50–1.35)
GFR calc Af Amer: >90 mL/min (ref >90)
GFR calc Af Amer: >90 mL/min (ref >90)
GFR calc Af Amer: >90 mL/min (ref >90)
GFR calc non Af Amer: >90 mL/min (ref >90)
GFR calc non Af Amer: >90 mL/min (ref >90)
Glucose, Bld: 139 mg/dL — ABNORMAL HIGH (ref 70–99)
Glucose, Bld: 156 mg/dL — ABNORMAL HIGH (ref 70–99)
Glucose, Bld: 99 mg/dL (ref 70–99)
POTASSIUM: 3.5 meq/L — AB (ref 3.7–5.3)
Potassium: 4 mEq/L (ref 3.7–5.3)
Potassium: 3.1 mEq/L — ABNORMAL LOW (ref 3.7–5.3)
Sodium: 139 mEq/L (ref 137–147)
Sodium: 141 mEq/L (ref 137–147)
Sodium: 141 mEq/L (ref 137–147)

## 2013-07-07 LAB — POCT ACTIVATED CLOTTING TIME
ACTIVATED CLOTTING TIME: 163 s
Activated Clotting Time: 281 seconds

## 2013-07-07 LAB — COMPREHENSIVE METABOLIC PANEL
ALT: 32 U/L (ref 0–53)
ANION GAP: 19 — AB (ref 5–15)
AST: 85 U/L — ABNORMAL HIGH (ref 0–37)
Albumin: 3.8 g/dL (ref 3.5–5.2)
Alkaline Phosphatase: 57 U/L (ref 39–117)
BILIRUBIN TOTAL: 0.5 mg/dL (ref 0.3–1.2)
BUN: 11 mg/dL (ref 6–23)
CO2: 17 mEq/L — ABNORMAL LOW (ref 19–32)
CREATININE: 0.66 mg/dL (ref 0.50–1.35)
Calcium: 7.3 mg/dL — ABNORMAL LOW (ref 8.4–10.5)
Chloride: 100 mEq/L (ref 96–112)
GFR calc non Af Amer: >90 mL/min (ref >90)
GLUCOSE: 136 mg/dL — AB (ref 70–99)
POTASSIUM: 4.2 meq/L (ref 3.7–5.3)
Sodium: 136 mEq/L — ABNORMAL LOW (ref 137–147)
Total Protein: 6.3 g/dL (ref 6.0–8.3)

## 2013-07-07 LAB — URINALYSIS, ROUTINE W REFLEX MICROSCOPIC
Bilirubin Urine: NEGATIVE
Glucose, UA: NEGATIVE mg/dL
Ketones, ur: NEGATIVE mg/dL
Leukocytes, UA: NEGATIVE
NITRITE: NEGATIVE
Protein, ur: NEGATIVE mg/dL
Specific Gravity, Urine: 1.04 — ABNORMAL HIGH (ref 1.005–1.030)
UROBILINOGEN UA: 0.2 mg/dL (ref 0.0–1.0)
pH: 6 (ref 5.0–8.0)

## 2013-07-07 LAB — URINE MICROSCOPIC-ADD ON

## 2013-07-07 LAB — PROTIME-INR
INR: 4.12 — ABNORMAL HIGH (ref 0.00–1.49)
INR: 1.12 (ref 0.00–1.49)
PROTHROMBIN TIME: 14.4 s (ref 11.6–15.2)
PROTHROMBIN TIME: 39.9 s — AB (ref 11.6–15.2)

## 2013-07-07 LAB — CBC
HEMATOCRIT: 39.1 % (ref 39.0–52.0)
HEMOGLOBIN: 13.4 g/dL (ref 13.0–17.0)
MCH: 32.4 pg (ref 26.0–34.0)
MCHC: 34.3 g/dL (ref 30.0–36.0)
MCV: 94.4 fL (ref 78.0–100.0)
Platelets: 281 10*3/uL (ref 150–400)
RBC: 4.14 MIL/uL — ABNORMAL LOW (ref 4.22–5.81)
RDW: 13.7 % (ref 11.5–15.5)
WBC: 16.2 10*3/uL — AB (ref 4.0–10.5)

## 2013-07-07 LAB — RAPID URINE DRUG SCREEN, HOSP PERFORMED
Amphetamines: NOT DETECTED
BARBITURATES: NOT DETECTED
Benzodiazepines: POSITIVE — AB
Cocaine: NOT DETECTED
OPIATES: NOT DETECTED
Tetrahydrocannabinol: POSITIVE — AB

## 2013-07-07 LAB — MAGNESIUM: Magnesium: 1.6 mg/dL (ref 1.5–2.5)

## 2013-07-07 LAB — TROPONIN I
TROPONIN I: 13.38 ng/mL — AB (ref <0.30)
Troponin I: >20 ng/mL (ref <0.30)

## 2013-07-07 LAB — MRSA PCR SCREENING: MRSA by PCR: NEGATIVE

## 2013-07-07 LAB — LACTIC ACID, PLASMA: Lactic Acid, Venous: 0.7 mmol/L (ref 0.5–2.2)

## 2013-07-07 LAB — APTT
APTT: 132 s — AB (ref 24–37)
APTT: 33 s (ref 24–37)

## 2013-07-07 MED ORDER — INSULIN ASPART 100 UNIT/ML ~~LOC~~ SOLN: 0.0000 [IU] | SUBCUTANEOUS | Status: DC

## 2013-07-07 MED ORDER — LABETALOL HCL 5 MG/ML IV SOLN
INTRAVENOUS | Status: AC
  Filled 2013-07-07: qty 4

## 2013-07-07 MED ORDER — METOPROLOL TARTRATE 1 MG/ML IV SOLN
INTRAVENOUS | Status: AC
  Filled 2013-07-07: qty 5

## 2013-07-07 MED ORDER — ATORVASTATIN CALCIUM 80 MG PO TABS
80.0000 mg | ORAL_TABLET | Freq: Every day | ORAL | Status: DC
Start: 2013-07-07 — End: 2013-07-23
  Administered 2013-07-07 – 2013-07-22 (×16): 80 mg via ORAL
  Filled 2013-07-07 (×17): qty 1

## 2013-07-07 MED ORDER — POTASSIUM CHLORIDE 10 MEQ/50ML IV SOLN
INTRAVENOUS | Status: AC
  Administered 2013-07-07: 10 meq
  Filled 2013-07-07: qty 50

## 2013-07-07 MED ORDER — HEPARIN SODIUM (PORCINE) 5000 UNIT/ML IJ SOLN
5000.0000 [IU] | Freq: Three times a day (TID) | INTRAMUSCULAR | Status: DC
  Administered 2013-07-07 – 2013-07-12 (×17): 5000 [IU] via SUBCUTANEOUS
  Filled 2013-07-07 (×19): qty 1

## 2013-07-07 MED ORDER — AMIODARONE HCL IN DEXTROSE 360-4.14 MG/200ML-% IV SOLN
60.0000 mg/h | INTRAVENOUS | Status: AC
  Administered 2013-07-07: 60 mg/h via INTRAVENOUS
  Filled 2013-07-07: qty 200

## 2013-07-07 MED ORDER — VITAMIN B-1 100 MG PO TABS
100.0000 mg | ORAL_TABLET | Freq: Every day | ORAL | Status: DC
  Administered 2013-07-07 – 2013-07-11 (×5): 100 mg
  Filled 2013-07-07 (×6): qty 1

## 2013-07-07 MED ORDER — MAGNESIUM SULFATE 40 MG/ML IJ SOLN
INTRAMUSCULAR | Status: AC
  Filled 2013-07-07: qty 50

## 2013-07-07 MED ORDER — LABETALOL HCL 5 MG/ML IV SOLN
10.0000 mg | Freq: Once | INTRAVENOUS | Status: AC
  Administered 2013-07-07: 10 mg via INTRAVENOUS

## 2013-07-07 MED ORDER — AMIODARONE HCL IN DEXTROSE 360-4.14 MG/200ML-% IV SOLN
30.0000 mg/h | INTRAVENOUS | Status: DC
Start: 2013-07-07 — End: 2013-07-09
  Filled 2013-07-07 (×7): qty 200

## 2013-07-07 MED ORDER — NITROGLYCERIN IN D5W 200-5 MCG/ML-% IV SOLN
INTRAVENOUS | Status: AC
  Filled 2013-07-07: qty 250

## 2013-07-07 MED ORDER — ATORVASTATIN CALCIUM 80 MG PO TABS: 80.0000 mg | ORAL_TABLET | Freq: Every day | ORAL | Status: DC

## 2013-07-07 MED ORDER — METOPROLOL TARTRATE 1 MG/ML IV SOLN
5.0000 mg | Freq: Four times a day (QID) | INTRAVENOUS | Status: DC
  Administered 2013-07-07 – 2013-07-09 (×3): 5 mg via INTRAVENOUS
  Filled 2013-07-07 (×12): qty 5

## 2013-07-07 MED ORDER — ALBUTEROL SULFATE (2.5 MG/3ML) 0.083% IN NEBU
3.0000 mL | INHALATION_SOLUTION | Freq: Four times a day (QID) | RESPIRATORY_TRACT | Status: DC
  Administered 2013-07-07 – 2013-07-23 (×65): 3 mL via RESPIRATORY_TRACT
  Filled 2013-07-07 (×66): qty 3

## 2013-07-07 MED ORDER — ASPIRIN 300 MG RE SUPP
300.0000 mg | Freq: Every day | RECTAL | Status: DC
  Administered 2013-07-08: 300 mg via RECTAL
  Filled 2013-07-07 (×3): qty 1

## 2013-07-07 MED ORDER — NITROGLYCERIN IN D5W 200-5 MCG/ML-% IV SOLN
2.0000 ug/min | INTRAVENOUS | Status: DC
  Filled 2013-07-07: qty 250

## 2013-07-07 MED ORDER — FOLIC ACID 1 MG PO TABS
1.0000 mg | ORAL_TABLET | Freq: Every day | ORAL | Status: DC
  Administered 2013-07-07 – 2013-07-23 (×17): 1 mg
  Filled 2013-07-07 (×17): qty 1

## 2013-07-07 MED ORDER — METOPROLOL TARTRATE 12.5 MG HALF TABLET
12.5000 mg | ORAL_TABLET | Freq: Two times a day (BID) | ORAL | Status: DC
  Filled 2013-07-07: qty 1

## 2013-07-07 MED ORDER — BENZTROPINE MESYLATE 0.5 MG PO TABS
0.5000 mg | ORAL_TABLET | Freq: Every day | ORAL | Status: DC
  Filled 2013-07-07: qty 1

## 2013-07-07 MED ORDER — INSULIN ASPART 100 UNIT/ML ~~LOC~~ SOLN
2.0000 [IU] | SUBCUTANEOUS | Status: DC
  Administered 2013-07-07: 2 [IU] via SUBCUTANEOUS
  Administered 2013-07-07: 6 [IU] via SUBCUTANEOUS
  Administered 2013-07-08 (×2): 2 [IU] via SUBCUTANEOUS
  Administered 2013-07-11: 4 [IU] via SUBCUTANEOUS
  Administered 2013-07-11 (×2): 2 [IU] via SUBCUTANEOUS
  Administered 2013-07-12 (×3): 4 [IU] via SUBCUTANEOUS
  Administered 2013-07-12 (×2): 2 [IU] via SUBCUTANEOUS
  Administered 2013-07-13: 4 [IU] via SUBCUTANEOUS
  Administered 2013-07-13 (×2): 2 [IU] via SUBCUTANEOUS
  Administered 2013-07-13 – 2013-07-14 (×5): 4 [IU] via SUBCUTANEOUS
  Administered 2013-07-14 (×2): 2 [IU] via SUBCUTANEOUS
  Administered 2013-07-14: 4 [IU] via SUBCUTANEOUS
  Administered 2013-07-15: 2 [IU] via SUBCUTANEOUS
  Administered 2013-07-15 (×2): 4 [IU] via SUBCUTANEOUS
  Administered 2013-07-15 – 2013-07-16 (×3): 2 [IU] via SUBCUTANEOUS
  Administered 2013-07-16: 4 [IU] via SUBCUTANEOUS
  Administered 2013-07-16: 2 [IU] via SUBCUTANEOUS
  Administered 2013-07-16: 4 [IU] via SUBCUTANEOUS
  Administered 2013-07-16 – 2013-07-18 (×7): 2 [IU] via SUBCUTANEOUS

## 2013-07-07 MED ORDER — DEXTROSE 50 % IV SOLN
1.0000 | Freq: Once | INTRAVENOUS | Status: AC
  Filled 2013-07-07: qty 50

## 2013-07-07 MED ORDER — BIOTENE DRY MOUTH MT LIQD
15.0000 mL | Freq: Four times a day (QID) | OROMUCOSAL | Status: DC
  Administered 2013-07-07 – 2013-07-23 (×65): 15 mL via OROMUCOSAL

## 2013-07-07 MED ORDER — SODIUM CHLORIDE 0.9 % IV SOLN: 250.0000 mL | INTRAVENOUS | Status: DC | PRN

## 2013-07-07 MED ORDER — BENZTROPINE MESYLATE 0.5 MG PO TABS
0.5000 mg | ORAL_TABLET | Freq: Every day | ORAL | Status: DC
  Administered 2013-07-07 – 2013-07-11 (×5): 0.5 mg
  Filled 2013-07-07 (×7): qty 1

## 2013-07-07 MED ORDER — POTASSIUM CHLORIDE 10 MEQ/100ML IV SOLN
10.0000 meq | INTRAVENOUS | Status: AC
  Administered 2013-07-07 (×2): 10 meq via INTRAVENOUS
  Filled 2013-07-07 (×2): qty 100

## 2013-07-07 MED ORDER — DEXTROSE 50 % IV SOLN
INTRAVENOUS | Status: AC
  Administered 2013-07-07: 50 mL
  Filled 2013-07-07: qty 50

## 2013-07-07 MED ORDER — POTASSIUM CHLORIDE 10 MEQ/50ML IV SOLN
10.0000 meq | INTRAVENOUS | Status: AC
  Administered 2013-07-07 (×3): 10 meq via INTRAVENOUS
  Filled 2013-07-07: qty 50

## 2013-07-07 MED ORDER — SODIUM CHLORIDE 0.9 % IV BOLUS (SEPSIS)
750.0000 mL | Freq: Once | INTRAVENOUS | Status: AC
  Administered 2013-07-07: 750 mL via INTRAVENOUS

## 2013-07-07 MED ORDER — HYDRALAZINE HCL 20 MG/ML IJ SOLN: 10.0000 mg | INTRAMUSCULAR | Status: DC | PRN

## 2013-07-07 MED ORDER — SODIUM CHLORIDE 0.9 % IV SOLN
25.0000 ug/h | INTRAVENOUS | Status: DC
  Administered 2013-07-07: 200 ug/h via INTRAVENOUS
  Administered 2013-07-07 – 2013-07-08 (×2): 250 ug/h via INTRAVENOUS
  Administered 2013-07-08: 25 ug/h via INTRAVENOUS
  Administered 2013-07-09 – 2013-07-12 (×7): 250 ug/h via INTRAVENOUS
  Administered 2013-07-12: 75 ug/h via INTRAVENOUS
  Filled 2013-07-07 (×11): qty 50

## 2013-07-07 MED ORDER — ARTIFICIAL TEARS OP OINT
TOPICAL_OINTMENT | OPHTHALMIC | Status: DC | PRN
Start: 2013-07-07 — End: 2013-07-23
  Administered 2013-07-07: 05:00:00 via OPHTHALMIC
  Administered 2013-07-08: 1 via OPHTHALMIC
  Filled 2013-07-07: qty 3.5

## 2013-07-07 MED ORDER — MAGNESIUM SULFATE 50 % IJ SOLN
3.0000 g | Freq: Once | INTRAVENOUS | Status: AC
  Administered 2013-07-07: 3 g via INTRAVENOUS
  Filled 2013-07-07: qty 6

## 2013-07-07 MED ORDER — ALBUTEROL SULFATE (2.5 MG/3ML) 0.083% IN NEBU
3.0000 mL | INHALATION_SOLUTION | RESPIRATORY_TRACT | Status: DC | PRN
Start: 2013-07-07 — End: 2013-07-07

## 2013-07-07 MED ORDER — PANTOPRAZOLE SODIUM 40 MG IV SOLR
40.0000 mg | Freq: Every day | INTRAVENOUS | Status: DC
  Administered 2013-07-07 – 2013-07-22 (×17): 40 mg via INTRAVENOUS
  Filled 2013-07-07 (×19): qty 40

## 2013-07-07 MED ORDER — POTASSIUM CHLORIDE 10 MEQ/100ML IV SOLN
10.0000 meq | INTRAVENOUS | Status: AC
  Administered 2013-07-07 (×4): 10 meq via INTRAVENOUS
  Filled 2013-07-07 (×3): qty 100

## 2013-07-07 MED ORDER — HYDRALAZINE HCL 20 MG/ML IJ SOLN
10.0000 mg | Freq: Once | INTRAMUSCULAR | Status: AC
  Administered 2013-07-07: 10 mg via INTRAVENOUS
  Filled 2013-07-07 (×3): qty 0.5

## 2013-07-07 MED ORDER — CHLORHEXIDINE GLUCONATE 0.12 % MT SOLN
15.0000 mL | Freq: Two times a day (BID) | OROMUCOSAL | Status: DC
  Administered 2013-07-07 – 2013-07-23 (×32): 15 mL via OROMUCOSAL
  Filled 2013-07-07 (×32): qty 15

## 2013-07-07 NOTE — Progress Notes (Signed)
eLink Physician-Brief Progress Note Patient Name: Chase Blackwell DOB: February 27, 1965 MRN: [id number]  Date of Service  07/07/2013   HPI/Events of Note  Best Practice   eICU Interventions  Protonix IV for stress ulcer propy   Intervention Category Intermediate Interventions: Best-practice therapies (e.g. DVT, beta blocker, etc.)  Gracy Ehly 07/07/2013, 12:28 AM

## 2013-07-07 NOTE — Progress Notes (Signed)
Radiology notified RN about small R pneumo on CXR post central line placement. [place] physician notified. No new orders received.

## 2013-07-07 NOTE — Progress Notes (Signed)
[facility] Progress Note Patient Name: Chase Blackwell DOB: April 28, 1965 MRN: [id number]  Date of Service  07/07/2013   HPI/Events of Note   K low  eICU Interventions  K IV replacement   Intervention Category Major Interventions: Electrolyte abnormality - evaluation and management  Asencion Noble 07/07/2013, 9:02 PM

## 2013-07-07 NOTE — Progress Notes (Signed)
Dr Joya Gaskins notified of CBG 153. Orders given to hold 1600 sliding scale insulin.

## 2013-07-07 NOTE — Progress Notes (Signed)
[facility] Progress Note Patient Name: Chase Blackwell DOB: May 02, 1965 MRN: [id number]  Date of Service  07/07/2013   HPI/Events of Note   oliguria  eICU Interventions  Give ns bolus   Intervention Category Intermediate Interventions: Oliguria - evaluation and management  Asencion Noble 07/07/2013, 7:07 PM

## 2013-07-07 NOTE — Progress Notes (Signed)
ABG was obtained in the Cath Lab  With the following result: PH 7.34, PCO2 42, PAO2 534, Bicarb 23.1, SATS 100%

## 2013-07-07 NOTE — Progress Notes (Signed)
INITIAL NUTRITION ASSESSMENT  DOCUMENTATION CODES Per approved criteria  -Not Applicable   INTERVENTION: - Once cooling protocol complete and body temperature WNL, recommend TF initiation via OGT of Vital 1.5 start at 37ml/hr increase by 82ml every 4 hours to goal of 26ml/hr. Goal rate will provide 2160 calories, 97g protein, and 1161ml free water and will meet 104% estimated calorie needs, 97% estimated protein needs. If IVF d/c, recommend 195ml water flushes 6 times/day - RD to monitor plan of care   NUTRITION DIAGNOSIS: Inadequate oral intake related to inability to eat as evidenced by NPO.   Goal: TF to meet >90% of estimated nutritional needs  Monitor:  Weights, labs, TF initiation, vent status   Reason for Assessment: Ventilated pt, consult for TF recommendations  48 y.o. male  Admitting Dx: Cardiac arrest  ASSESSMENT: Pt with PMHx of HTN, asthma, chronic pain, schizophrenia, and alcohol abuse. Presented to ER 7/2 with hip pain after a fall and released. Pt brought in by EMS after successful resuscitation of out of cardiac arrest, was intubated. Initial ECG showed inferior wall myocardial infarction, so code STEMI was activated. He has also been started on cooling protocol.   Patient is currently intubated on ventilator support MV: 15.3 L/min Temp (24hrs), Avg:92.9 F (33.8 C), Min:89.8 F (32.1 C), Max:96.3 F (35.7 C)  Propofol: off  Potassium slightly low, getting IV replacement    Height: Ht Readings from Last 1 Encounters:  07/06/13 5\' 11"  (1.803 m)    Weight: Wt Readings from Last 1 Encounters:  07/07/13 179 lb 3.7 oz (81.3 kg)    Ideal Body Weight: 172 lbs   % Ideal Body Weight: 104%  Wt Readings from Last 10 Encounters:  07/07/13 179 lb 3.7 oz (81.3 kg)  07/07/13 179 lb 3.7 oz (81.3 kg)  02/01/13 184 lb (83.462 kg)  07/31/08 236 lb 4 oz (107.162 kg)    Usual Body Weight: 184 lbs in January 2015  % Usual Body Weight: 97%  BMI:  Body mass  index is 25.01 kg/(m^2).  Estimated Nutritional Needs: Kcal: 2083 Protein: 100-120g Fluid: per MD  Skin: Intact   Diet Order: NPO  EDUCATION NEEDS: -No education needs identified at this time   Intake/Output Summary (Last 24 hours) at 07/07/13 0922 Last data filed at 07/07/13 0900  Gross per 24 hour  Intake 5138.01 ml  Output   4320 ml  Net 818.01 ml    Last BM: PTA  Labs:   Recent Labs Lab 07/06/13 2145 07/07/13 0020  07/07/13 0300 07/07/13 0409 07/07/13 0610 07/07/13 0800  NA 134* 136*  < >  --  139 139 139  K 4.0 4.2  < >  --  3.6* 3.7 3.5*  CL 93* 100  < >  --  106 107 106  CO2 16* 17*  --   --   --   --  18*  BUN 10 11  < >  --  11 10 11   CREATININE 0.85 0.66  < >  --  0.40* 0.40* 0.49*  CALCIUM 8.0* 7.3*  --   --   --   --  7.6*  MG  --   --   --  1.6  --   --   --   GLUCOSE 230* 136*  < >  --  218* 190* 139*  < > = values in this interval not displayed.  CBG (last 3)   Recent Labs  07/07/13 0608 07/07/13 0710 07/07/13 0804  GLUCAP  184* 160* 140*    Scheduled Meds: . sodium chloride  2,000 mL Intravenous Once  . albuterol  3 mL Inhalation Q6H  . antiseptic oral rinse  15 mL Mouth Rinse QID  . aspirin  81 mg Oral Daily  . aspirin  300 mg Rectal Daily  . atorvastatin  80 mg Oral q1800  . benztropine  0.5 mg Per Tube Daily  . chlorhexidine  15 mL Mouth Rinse BID  . folic acid  1 mg Per Tube Daily  . heparin  5,000 Units Subcutaneous 3 times per day  . insulin aspart  2-6 Units Subcutaneous 6 times per day  . metoprolol  5 mg Intravenous 4 times per day  . multivitamin with minerals  1 tablet Oral Daily  . pantoprazole (PROTONIX) IV  40 mg Intravenous QHS  . thiamine  100 mg Per Tube Daily  . ticagrelor  90 mg Oral BID    Continuous Infusions: . sodium chloride 150 mL/hr at 07/06/13 2345  . amiodarone    . bivalirudin (ANGIOMAX) infusion 5 mg/mL (Cath Lab,ACS,PCI indication) Stopped (07/06/13 2245)  . cisatracurium (NIMBEX) infusion  1.25 mcg/kg/min (07/07/13 0919)  . midazolam (VERSED) infusion 6 mg/hr (07/07/13 0919)  . nitroGLYCERIN Stopped (07/07/13 0630)  . norepinephrine (LEVOPHED) Adult infusion    . tirofiban Stopped (07/07/13 0000)    Past Medical History  Diagnosis Date  . Hypertension   . Asthma   . GERD (gastroesophageal reflux disease)   . Chronic pain   . Schizophrenia   . Alcohol abuse     No past surgical history on file.  Carlis Stable MS, RD, LDN [phone] Weekend/After Hours Pager

## 2013-07-07 NOTE — Procedures (Signed)
Central Venous Catheter Insertion Procedure Note Chase Blackwell [id number] [birth year]  Procedure: Insertion of Central Venous Catheter Indications: Assessment of intravascular volume  Procedure Details Consent: Unable to obtain consent because of altered level of consciousness. Time Out: Verified patient identification, verified procedure, site/side was marked, verified correct patient position, special equipment/implants available, medications/allergies/relevent history reviewed, required imaging and test results available.  Performed  Maximum sterile technique was used including antiseptics, cap, gloves, gown, hand hygiene, mask and sheet. Skin prep: Chlorhexidine; local anesthetic administered  Unable to cannulae R subclavian vein. Able to cannulate RIJ several times but unable to pass wire. Patient developed R neck hematoma and manual pressure was held. Using ultrasound guidance RIJ then cannulated more superiorly and wire passed easily.  A antimicrobial bonded/coated triple lumen catheter was placed in the right internal jugular vein using the Seldinger technique.  Evaluation Blood flow good Complications: Complications of R neck hematoma Patient did tolerate procedure well. Chest X-ray ordered to verify placement.  CXR: pending.  Chase Bickers MD 07/07/2013, 3:13 AM

## 2013-07-07 NOTE — Progress Notes (Signed)
Ref: Default, Provider, MD   Subjective:  Remains intubated, sedated and on hypothermia protocol. Underwent RCA angioplasty earlier.   Objective:  Vital Signs in the last 24 hours: Temp:  [89.8 F (32.1 C)-96.3 F (35.7 C)] 91.9 F (33.3 C) (07/04 1100) Pulse Rate:  [41-101] 43 (07/04 1010) Cardiac Rhythm:  [-] Sinus bradycardia;Other (Comment) (07/04 0730) Resp:  [0-31] 25 (07/04 1010) BP: (96-203)/(65-118) 108/71 mmHg (07/04 0945) SpO2:  [86 %-100 %] 100 % (07/04 1010) Arterial Line BP: (109-208)/(56-109) 112/63 mmHg (07/04 1010) FiO2 (%):  [40 %-100 %] 40 % (07/04 0852) Weight:  [81.3 kg (179 lb 3.7 oz)-83.462 kg (184 lb)] 81.3 kg (179 lb 3.7 oz) (07/04 0500)  Physical Exam: BP Readings from Last 1 Encounters:  07/07/13 108/71    Wt Readings from Last 1 Encounters:  07/07/13 81.3 kg (179 lb 3.7 oz)    Weight change:   HEENT: [place]/AT, Eyes-Hazel, sluggishly responding to light, Conjunctiva-Pink, Sclera-Non-icteric. Intubated. Neck: No JVD, No bruit, Trachea midline. Lungs:  Clear, Bilateral. Cardiac: Bradycardic, Regular rhythm, normal S1 and S2, no S3. II/VI systolic murmur. Abdomen:  Soft, non-tender. Extremities:  No edema present. No cyanosis. No clubbing. CNS: AxOx0. Skin: Cool and dry.   Intake/Output from previous day: 07/03 0701 - 07/04 0700 In: 4769 [I.V.:4513; IV Piggyback:256] Out: 2353 [Urine:4150]    Lab Results: BMET    Component Value Date/Time   NA 139 07/07/2013 0800   NA 139 07/07/2013 0610   NA 139 07/07/2013 0409   K 3.5* 07/07/2013 0800   K 3.7 07/07/2013 0610   K 3.6* 07/07/2013 0409   CL 106 07/07/2013 0800   CL 107 07/07/2013 0610   CL 106 07/07/2013 0409   CO2 18* 07/07/2013 0800   CO2 17* 07/07/2013 0020   CO2 16* 07/06/2013 2145   GLUCOSE 139* 07/07/2013 0800   GLUCOSE 190* 07/07/2013 0610   GLUCOSE 218* 07/07/2013 0409   BUN 11 07/07/2013 0800   BUN 10 07/07/2013 0610   BUN 11 07/07/2013 0409   CREATININE 0.49* 07/07/2013 0800   CREATININE 0.40* 07/07/2013 0610    CREATININE 0.40* 07/07/2013 0409   CALCIUM 7.6* 07/07/2013 0800   CALCIUM 7.3* 07/07/2013 0020   CALCIUM 8.0* 07/06/2013 2145   GFRNONAA >90 07/07/2013 0800   GFRNONAA >90 07/07/2013 0020   GFRNONAA >90 07/06/2013 2145   GFRAA >90 07/07/2013 0800   GFRAA >90 07/07/2013 0020   GFRAA >90 07/06/2013 2145   CBC    Component Value Date/Time   WBC 16.2* 07/07/2013 0020   RBC 4.14* 07/07/2013 0020   HGB 13.6 07/07/2013 0610   HCT 40.0 07/07/2013 0610   PLT 281 07/07/2013 0020   MCV 94.4 07/07/2013 0020   MCH 32.4 07/07/2013 0020   MCHC 34.3 07/07/2013 0020   RDW 13.7 07/07/2013 0020   LYMPHSABS 1.9 06/27/2013 1935   MONOABS 0.5 06/27/2013 1935   EOSABS 0.2 06/27/2013 1935   BASOSABS 0.0 06/27/2013 1935   HEPATIC Function Panel  Recent Labs  07/06/13 2145 07/07/13 0020  PROT 6.3 6.3   HEMOGLOBIN A1C No components found with this basename: HGA1C,  MPG   CARDIAC ENZYMES Lab Results  Component Value Date   CKTOTAL 128 10/19/2008   CKMB 2.1 10/19/2008   TROPONINI >20.00* 07/07/2013   TROPONINI >20.00* 07/07/2013   TROPONINI 13.38* 07/06/2013   BNP  Recent Labs  02/01/13 1221 06/27/13 1933  PROBNP 192.7* 146.2*   TSH No results found for this basename: TSH,  in the  last 8760 hours CHOLESTEROL No results found for this basename: CHOL,  in the last 8760 hours  Scheduled Meds: . sodium chloride  2,000 mL Intravenous Once  . albuterol  3 mL Inhalation Q6H  . antiseptic oral rinse  15 mL Mouth Rinse QID  . aspirin  81 mg Oral Daily  . aspirin  300 mg Rectal Daily  . atorvastatin  80 mg Oral q1800  . benztropine  0.5 mg Per Tube Daily  . chlorhexidine  15 mL Mouth Rinse BID  . folic acid  1 mg Per Tube Daily  . heparin  5,000 Units Subcutaneous 3 times per day  . insulin aspart  2-6 Units Subcutaneous 6 times per day  . metoprolol  5 mg Intravenous 4 times per day  . multivitamin with minerals  1 tablet Oral Daily  . pantoprazole (PROTONIX) IV  40 mg Intravenous QHS  . potassium chloride  10 mEq  Intravenous Q1 Hr x 4  . thiamine  100 mg Per Tube Daily  . ticagrelor  90 mg Oral BID   Continuous Infusions: . amiodarone    . cisatracurium (NIMBEX) infusion 1 mcg/kg/min (07/07/13 1115)  . fentaNYL infusion INTRAVENOUS    . midazolam (VERSED) infusion 6 mg/hr (07/07/13 0919)  . nitroGLYCERIN Stopped (07/07/13 0630)  . norepinephrine (LEVOPHED) Adult infusion Stopped (07/07/13 1117)  . tirofiban Stopped (07/07/13 0000)   PRN Meds:.sodium chloride, acetaminophen, artificial tears, cisatracurium, fentaNYL, hydrALAZINE, midazolam, ondansetron (ZOFRAN) IV  Assessment/Plan: Acute inferior wall myocardial infarction  Status post V. fib cardiac arrest  S/P Angioplasty Sinus bradycardia Acute respiratory failure secondary to above  Hypertension  History of bronchial asthma  GERD  History of schizoaffective disorder  EtOH abuse  Chronic pain syndrome  Plan: Continue medical treatment.      LOS: 1 day    Dixie Dials  MD  07/07/2013, 11:32 AM

## 2013-07-07 NOTE — Progress Notes (Signed)
PULMONARY  / CRITICAL CARE MEDICINE  Name: Chase Blackwell MRN: [id number] DOB: 28-Oct-1965 PCP Default, Provider, MD   ADMISSION DATE:  2013-08-03 LOS 1 days    PRIMARY SERVICE: CCM  CHIEF COMPLAINT:  VF Arrest -> Inferior STEMI (Cooling Protocol)  CARDIOLOGY: Harwani/Kadakia  BRIEF PATIENT DESCRIPTION:   48 y/o male alcoholic presented to ER with VF arrest and inferior STEMI on 04-Aug-2022. Cooling protocol started and taken to cath lab.   LINES / TUBES: ETT 04-Aug-2022 R IJ CVL 7/4 L Rad art line 7/4   CULTURES: None  ANTIBIOTICS: None   SIGNIFICANT EVENTS / STUDIES:  August 04, 2022 Intubated in field 08-04-22 Taken to cath lab RCA occluded. Left system ok. S/p PCI 2022/08/04 Hypothermia protocol initiated     SUBJECTIVE:  Small R PTX apical, not changed on f/u film  VITAL SIGNS: Filed Vitals:   07/07/13 0530 07/07/13 0600 07/07/13 0630 07/07/13 0700  BP: 96/65 98/69 114/80 125/89  Pulse: 52 51 49 44  Temp:  92.1 F (33.4 C)  91.6 F (33.1 C)  TempSrc:  Core (Comment)  Core (Comment)  Resp: 25 25 25 25   Height:      Weight:      SpO2: 100% 100% 100% 100%      HEMODYNAMICS: Bradycardia. CV stable   VENTILATOR SETTINGS: Vent Mode:  [-] PRVC FiO2 (%):  [40 %-100 %] 50 % Set Rate:  [16 bmp-25 bmp] 25 bmp Vt Set:  [600 mL] 600 mL PEEP:  [5 cmH20] 5 cmH20 Plateau Pressure:  [15 cmH20-22 cmH20] 20 cmH20 Note Peak/plateau pressure difference  Elevated  INTAKE / OUTPUT: I/O last 3 completed shifts: In: 4769 [I.V.:4513; IV Piggyback:256] Out: 6834 [Urine:4150]    PHYSICAL EXAMINATION: General:  Intubated. unkempt Neuro:  Intubated sedated, paralyzed HEENT:  Normal except for ETT and poor dentition Cardiovascular:  RRR no obvious murmurs or gallops Lungs:  Distant BS,   Abdomen:  Soft. NT/ND good BS Extremities: No c/c/e. DPs 2+ bilaterally  Interosseous line Musculoskeletal:  Normal tone Skin:  No rash   LABS: PULMONARY  Recent Labs Lab 03-Aug-2013 2158 07/07/13 0028  07/07/13 0232 07/07/13 0409 07/07/13 0610  PHART 7.250*  --   --   --   --   PCO2ART 41.9  --   --   --   --   PO2ART 89.0  --   --   --   --   HCO3 18.3*  --   --   --   --   TCO2 20 17 16 16 17   O2SAT 95.0  --   --   --   --     CBC  Recent Labs Lab 07/05/13 2046 2013-08-03 2145 07/07/13 0020  07/07/13 0232 07/07/13 0409 07/07/13 0610  HGB 13.5 13.9 13.4  < > 15.0 14.6 13.6  HCT 38.0* 40.6 39.1  < > 44.0 43.0 40.0  WBC 9.9 15.2* 16.2*  --   --   --   --   PLT 280 288 281  --   --   --   --   < > = values in this interval not displayed.  COAGULATION  Recent Labs Lab 03-Aug-2013 2145 07/07/13 0030  INR 1.06 4.12*    CARDIAC    Recent Labs Lab 2013/08/03 2145 03-Aug-2013 2359 07/07/13 0330  TROPONINI 0.59* 13.38* >20.00*   No results found for this basename: PROBNP,  in the last 168 hours   CHEMISTRY  Recent Labs Lab 07/05/13 2046 08/03/13 2145  07/07/13 0020 07/07/13 0028 07/07/13 0232 07/07/13 0300 07/07/13 0409 07/07/13 0610  NA 133* 134* 136* 137 139  --  139 139  K 3.7 4.0 4.2 4.1 3.3*  --  3.6* 3.7  CL 99 93* 100 105 105  --  106 107  CO2 20 16* 17*  --   --   --   --   --   GLUCOSE 87 230* 136* 121* 202*  --  218* 190*  BUN 7 10 11 10 11   --  11 10  CREATININE 0.83 0.85 0.66 0.70 0.50  --  0.40* 0.40*  CALCIUM 8.3* 8.0* 7.3*  --   --   --   --   --   MG  --   --   --   --   --  1.6  --   --    Estimated Creatinine Clearance: 121.6 ml/min (by C-G formula based on Cr of 0.4).   LIVER  Recent Labs Lab 07/06/13 2145 07/07/13 0020 07/07/13 0030  AST 60* 85*  --   ALT 30 32  --   ALKPHOS 63 57  --   BILITOT 0.2* 0.5  --   PROT 6.3 6.3  --   ALBUMIN 3.5 3.8  --   INR 1.06  --  4.12*     INFECTIOUS  Recent Labs Lab 07/07/13 0315  LATICACIDVEN 0.7     ENDOCRINE CBG (last 3)   Recent Labs  07/07/13 0311 07/07/13 0408 07/07/13 0521  GLUCAP 208* 207* 198*         IMAGING x48h  Dg Chest Port 1 View  07/07/2013    CLINICAL DATA:  Assess endotracheal tube and central line  EXAM: PORTABLE CHEST - 1 VIEW  COMPARISON:  06/24/2013  FINDINGS: Endotracheal tube ends at the level of the mid thoracic trachea. The orogastric tube enters the stomach, with side port at the GE junction. There is a right IJ central line, tip at the lower SVC.  Normal heart size in upper mediastinal contours. There is no edema, consolidation, or effusion. A tiny right apical pneumothorax is noted (less than 5%).  Critical Value/emergent results were called by telephone at the time of interpretation on 07/07/2013 at 4:06 AM to ICU RN Karsten Fells, who verbally acknowledged these results and will relay to Ages on call.  IMPRESSION: 1. Endotracheal tube and central line in good position. The orogastric tube enters the stomach, but could be advanced 5 cm for more secure positioning. 2. Tiny right-sided pneumothorax.   Electronically Signed   By: Jorje Guild M.D.   On: 07/07/2013 04:08    ASSESSMENT / PLAN: Principal Problem:   Cardiac arrest Active Problems:   PARANOID SCHIZOPHRENIA, CHRONIC   ALCOHOL ABUSE   SUBSTANCE ABUSE, MULTIPLE   HYPERTENSION   BRONCHITIS   ST elevation myocardial infarction (STEMI) of inferior wall, initial episode of care   Acute respiratory failure   Iatrogenic pneumothorax   PULMONARY A: Acute respiratory failure due to acute MI and patient paralyzed on vent cooling protocol Hx of asthma with airtrapping on vent.  Airway obstruction P:  Vent bundle      Wean as tolerated after hypothermia complete      BDs to start   CARDIOVASCULAR A: 1) Ventricular fibrillation arrest      2) Acute inferior STEM due to occluded RCA P: PCI of RCA per cardiology done per . Continue DAPT, ASA, Statin      Hypothermia protocol   GASTROINTESTINAL  A:  Stress ulcer prophylaxis P:  PPI  NPO for now   NEUROLOGIC A:  ETOH abuse P:  Check ETOH level.       ETOH WD protocol once off hypothermia.       Had 15 mins  CPR. Will need assessment for anoxic brain injury once hypothermia complete.    GLOBAL A: Heparin for DVT prophylaxis  Todays summary:  Vf arrest  IWMI, RCA stent.  Pt on cooling protocol to continue. Small R apical PTX, no change on f/u film, observe. Start BDs  The patient is critically ill with multiple organ systems failure and requires high complexity decision making for assessment and support, frequent evaluation and titration of therapies, application of advanced monitoring technologies and extensive interpretation of multiple databases.   Critical Care Time devoted to patient care services described in this note is  30  Minutes.  Erik Obey Beeper  [phone]  Cell  [phone]  If no response or cell goes to voicemail, call beeper [phone]  Critical Care Medicine 7:27 AM 07/07/2013 7:27 AM

## 2013-07-07 NOTE — Progress Notes (Signed)
R femoral sheath removal complete per Dr. Haroldine Laws x20 minutes. R groin level 0.

## 2013-07-07 NOTE — Cardiovascular Report (Signed)
NAME:  LINDEL, MARCELL NO.:  [id number]  MEDICAL RECORD NO.:  [id number]  LOCATION:  2H12C                        FACILITY:  [facility]  PHYSICIAN:  Makyle Eslick N. Terrence Dupont, M.D. DATE OF BIRTH:  1965-03-11  DATE OF PROCEDURE: DATE OF DISCHARGE:                           CARDIAC CATHETERIZATION   PROCEDURES: 1. Left cardiac catheterization with selective left and right coronary     angiography, left ventriculography via right groin using Judkins     technique. 2. Successful PTCA to 100% occluded proximal RCA using 2.5 x 12 mm     long Euphora balloon. 3. Successful deployment of 3.0 x 33 mm long Xience Alpine drug-     eluting stent in proximal and mid RCA. 4. Successful postdilatation of this stent using 3.25 x 20 mm long [place]     Trek balloon first and then 3.75 x 20 mm long [place] Trek balloon going     up to 18 atmospheric pressure.  INDICATION FOR THE PROCEDURE:  Mr. Vanderloop is a 48 year old male with past medical history significant for hypertension, history of bronchial asthma, GERD, history of schizo-affective disorder, alcohol abuse, chronic pain syndrome, was found by the neighbors unresponsive on the street.  EMS was called.  The patient was placed on EAD and was noted to have shockable VFib, shockable rhythm requiring defibrillation x3 and CPR approximately 15 minutes.  EKG done showed normal sinus rhythm with right bundle-branch block with left posterior fascicular block and ST elevation in inferior leads.  Code STEMI was called.  The patient presently intubated unresponsive and was brought emergently to the Cath Lab for emergency PCI.  No family member is available.  The patient will be admitted by CCM.  Cooling protocol has been initiated.  DESCRIPTION OF PROCEDURE:  The patient was emergently brought to the cath lab and was placed on fluoroscopy table.  Right groin was prepped and draped in usual fashion 1% Xylocaine was used for local anesthesia in the right  groin.  With the help of thin wall needle, a 6-French arterial sheath was placed.  The sheath was aspirated and flushed. Next, 6-French left Judkins catheter was advanced over the wire under fluoroscopic guidance up to the ascending aorta.  Wire was pulled out. The catheter was aspirated and connected to the Manifold.  Catheter was further advanced and engaged into left coronary ostium.  Multiple views of the left system were taken.  Next, catheter was disengaged and was pulled out over the wire and was replaced with 6-French right Judkins guiding catheter, which was advanced over the wire under fluoroscopic guidance up to the ascending aorta.  Wire was pulled out.  The catheter was aspirated and connected to the Manifold.  Catheter was further advanced and engaged into right coronary ostium.  A single view of right coronary artery was obtained.  Next, the catheter was disengaged at the end of the procedure and was replaced with 6-French pigtail catheter, which was advanced over the wire under fluoroscopic guidance up to the ascending aorta.  Catheter was further advanced across the aortic valve into the LV.  LV pressures were recorded.  Next, LV graft was done in 30- degree RAO position.  Post-angiographic pressures were recorded from LV and then pullback pressures were recorded from aorta.  There was no gradient across the aortic valve.  Next, pigtail catheter was pulled out over the wire.  Sheaths were aspirated and flushed.  FINDINGS:  LV showed inferior wall hypokinesia.  EF of approximately 45%- 50%.  Left main was patent.  LAD has 20%-25% proximal stenosis. Diagonal 1 is patent, which has mild disease in the mid portion. Diagonal 2 is very small.  Ramus is small, which is patent.  Left circumflex has 20%-30% proximal stenosis.  OM1 is small, which is long vessel, which is patent.  OM2 is patent.  RCA was 100% occluded proximally, which was filling distally by collaterals from the  left system.  INTERVENTIONAL PROCEDURE:  Successful PTCA to 100% occluded proximal RCA was done using 2.5 x 12 mm long Euphora balloon for predilatation and then 3.0 x 33 mm long Xience Alpine drug-eluting stent was deployed in proximal and mid RCA at 11 atmospheric pressures.  This stent was postdilated initially using 3.25 x 20 mm long [place] Trek balloon and then 3.75 x 20 mm long [place] Trek balloon going up to 18 atmospheric pressure. Lesion dilated from 100% to 0% residual with excellent TIMI grade 3 distal flow without evidence of dissection or distal embolization.  The patient received weight based Angiomax, 180 mg of Brilinta, and Aggrastat during the procedure.  The patient tolerated procedure well. There were no complications.  The patient was transferred to CCU in stable condition.     Allegra Lai. Terrence Dupont, M.D.     MNH/MEDQ  D:  07/06/2013  T:  07/07/2013  Job:  [id number]

## 2013-07-07 NOTE — Procedures (Signed)
Arterial Catheter Insertion Procedure Note Chase Blackwell [id number] 1965/08/02  Procedure: Insertion of Arterial Catheter  Indications: Blood pressure monitoring  Procedure Details Consent: Unable to obtain consent because of emergent medical necessity. Time Out: Verified patient identification, verified procedure, site/side was marked, verified correct patient position, special equipment/implants available, medications/allergies/relevent history reviewed, required imaging and test results available.  Performed  Maximum sterile technique was used including antiseptics, cap, gloves, gown, hand hygiene, mask and sheet. Skin prep: Chlorhexidine; local anesthetic administered 20 gauge catheter was inserted into left radial artery using the Seldinger technique.  Evaluation Blood flow good; BP tracing good. Complications: No apparent complications.   Arlyce Harman Evette 07/07/2013

## 2013-07-07 NOTE — Progress Notes (Signed)
1400 CBG 38. I amp D50 given IV. Repeat CBG 238.

## 2013-07-07 NOTE — ED Provider Notes (Signed)
CSN: [id number]     Arrival date & time 07/06/13  2129 History   First MD Initiated Contact with Patient 07/06/13 2146     Chief Complaint  Patient presents with  . Cardiac Arrest  . Code STEMI   48 y/o male was brought in by EMS when he was found done. Per EMS, patient was initially found in cardiac arrest and sustained 2 shocks by AED perform medics arrived. The patient was then given 3 additional shocks given a total of amiodarone 450mg , and 1mg  of epi. The patient was intubated in teh field and ROS was achieved. On presentation the patient is starting to fight the tube, he does have noted trauma of his left elbow but otherwise does not have outward signs of trauma. Pupils are 4mm and sluggish, pulse is regular, and skin is cool. Patient does not have an blatant signs of infection.    (Consider location/radiation/quality/duration/timing/severity/associated sxs/prior Treatment) Patient is a 48 y.o. male presenting with altered mental status.  Altered Mental Status Presenting symptoms: unresponsiveness   Severity:  Severe Most recent episode:  Today Episode history:  Single Timing:  Constant Progression:  Unchanged Chronicity:  New Associated symptoms: no rash     Past Medical History  Diagnosis Date  . Hypertension   . Asthma   . GERD (gastroesophageal reflux disease)   . Chronic pain   . Schizophrenia   . Alcohol abuse    No past surgical history on file. Family History  Problem Relation Age of Onset  . Malignant hyperthermia Mother   . Cirrhosis Father   . Alcohol abuse Father    History  Substance Use Topics  . Smoking status: Current Every Day Smoker -- 1.00 packs/day    Types: Cigarettes  . Smokeless tobacco: Not on file  . Alcohol Use: 1.2 oz/week    2 Cans of beer per week     Comment: 1 400 oz per week    Review of Systems  Unable to perform ROS Skin: Negative for rash.      Allergies  Cetirizine & related; Hctz; Hydroxyzine; and Sulfonamide  derivatives  Home Medications   Prior to Admission medications   Medication Sig Start Date End Date Taking? Authorizing Provider  acetaminophen (TYLENOL) 325 MG tablet Take 650 mg by mouth every 6 (six) hours as needed.    Historical Provider, MD  albuterol (PROVENTIL HFA;VENTOLIN HFA) 108 (90 BASE) MCG/ACT inhaler Inhale 2 puffs into the lungs every 6 (six) hours as needed. Wheezing or shortness of breath    Historical Provider, MD  albuterol (PROVENTIL HFA;VENTOLIN HFA) 108 (90 BASE) MCG/ACT inhaler Inhale 1-2 puffs into the lungs every 4 (four) hours as needed for wheezing or shortness of breath. 06/27/13   Elmyra Ricks Pisciotta, PA-C  benztropine (COGENTIN) 0.5 MG tablet Take 0.5 mg by mouth daily.     Historical Provider, MD  haloperidol decanoate (HALDOL DECANOATE) 100 MG/ML injection Inject 100 mg into the muscle every 28 (twenty-eight) days.    Historical Provider, MD  HYDROcodone-acetaminophen (NORCO/VICODIN) 5-325 MG per tablet Take 1-2 tablets by mouth every 6 hours as needed for pain. 06/27/13   Nicole Pisciotta, PA-C  niacin (NIASPAN) 500 MG CR tablet Take 500 mg by mouth at bedtime.    Historical Provider, MD  PARoxetine (PAXIL) 40 MG tablet Take 40 mg by mouth every morning.    Historical Provider, MD  rosuvastatin (CRESTOR) 20 MG tablet Take 20 mg by mouth daily.    Historical Provider, MD  traZODone (  DESYREL) 100 MG tablet Take 100 mg by mouth at bedtime.    Historical Provider, MD   BP 165/102  Pulse 58  Temp(Src) 91 F (32.8 C) (Core (Comment))  Resp 25  Ht 5\' 11"  (1.803 m)  Wt 188 lb 0.8 oz (85.3 kg)  BMI 26.24 kg/m2  SpO2 96% Physical Exam  Nursing note and vitals reviewed. Constitutional: He is oriented to person, place, and time. He appears well-developed and well-nourished.  HENT:  Head: Normocephalic and atraumatic.  Eyes:  82mm and sluggish   Neck: Neck supple.  Cardiovascular: Normal rate and regular rhythm.  Exam reveals no gallop and no friction rub.   No  murmur heard. Pulmonary/Chest:  Patient is intubated  Breath sound coarse but present bilaterally   Abdominal: Soft. He exhibits no distension.  Musculoskeletal: He exhibits no edema.  Neurological: He is alert and oriented to person, place, and time.  Skin:  Cool   Psychiatric:  Intubated     ED Course  Procedures (including critical care time) Labs Review Labs Reviewed  CBC - Abnormal; Notable for the following:    WBC 15.2 (*)    All other components within normal limits  BASIC METABOLIC PANEL - Abnormal; Notable for the following:    Sodium 134 (*)    Chloride 93 (*)    CO2 16 (*)    Glucose, Bld 230 (*)    Calcium 8.0 (*)    Anion gap 25 (*)    All other components within normal limits  TROPONIN I - Abnormal; Notable for the following:    Troponin I 0.59 (*)    All other components within normal limits  HEPATIC FUNCTION PANEL - Abnormal; Notable for the following:    AST 60 (*)    Total Bilirubin 0.2 (*)    All other components within normal limits  URINALYSIS, ROUTINE W REFLEX MICROSCOPIC - Abnormal; Notable for the following:    Specific Gravity, Urine 1.040 (*)    Hgb urine dipstick TRACE (*)    All other components within normal limits  URINE RAPID DRUG SCREEN (HOSP PERFORMED) - Abnormal; Notable for the following:    Benzodiazepines POSITIVE (*)    Tetrahydrocannabinol POSITIVE (*)    All other components within normal limits  CBC - Abnormal; Notable for the following:    WBC 16.2 (*)    RBC 4.14 (*)    All other components within normal limits  TROPONIN I - Abnormal; Notable for the following:    Troponin I 13.38 (*)    All other components within normal limits  APTT - Abnormal; Notable for the following:    aPTT 132 (*)    All other components within normal limits  PROTIME-INR - Abnormal; Notable for the following:    Prothrombin Time 39.9 (*)    INR 4.12 (*)    All other components within normal limits  COMPREHENSIVE METABOLIC PANEL - Abnormal;  Notable for the following:    Sodium 136 (*)    CO2 17 (*)    Glucose, Bld 136 (*)    Calcium 7.3 (*)    AST 85 (*)    Anion gap 19 (*)    All other components within normal limits  I-STAT ARTERIAL BLOOD GAS, ED - Abnormal; Notable for the following:    pH, Arterial 7.250 (*)    Bicarbonate 18.3 (*)    Acid-base deficit 9.0 (*)    All other components within normal limits  MRSA PCR SCREENING  URINE CULTURE  MRSA PCR SCREENING  APTT  PROTIME-INR  ETHANOL  URINE MICROSCOPIC-ADD ON  DRUGS OF ABUSE SCREEN W ALC, ROUTINE URINE  TROPONIN I  TROPONIN I  TROPONIN I  LACTIC ACID, PLASMA  MAGNESIUM  CBG MONITORING, ED  CBG MONITORING, ED    Imaging Review No results found.   EKG Interpretation None      MDM   Final diagnoses:    Acute respiratory failure with hypoxia    Cardiac arrest  ST elevation myocardial infarction (STEMI) of inferior wall, initial episode of care   48 y/o male that presents from EMS found down, patient is a ROSC after cardiac resuscitation with 450 amiodarone, multiple shocks and 1 mg epinephrine.  On EKG patient was found to have evidence of inferior MI and the cath lab was activated. At this time rectal ASA was administered and the patient was started on a heparin drip and initiated cooling protocol. The patient remained hemodynamically stable in the resus bay with spontaneous circulation until cardiology was able to transfer him to cath lab.      Claudean Severance, MD 07/07/13 [phone]

## 2013-07-08 ENCOUNTER — Inpatient Hospital Stay (HOSPITAL_COMMUNITY): Payer: Medicare Other

## 2013-07-08 DIAGNOSIS — I469 Cardiac arrest, cause unspecified: Secondary | ICD-10-CM

## 2013-07-08 DIAGNOSIS — J9600 Acute respiratory failure, unspecified whether with hypoxia or hypercapnia: Secondary | ICD-10-CM

## 2013-07-08 DIAGNOSIS — J40 Bronchitis, not specified as acute or chronic: Secondary | ICD-10-CM

## 2013-07-08 DIAGNOSIS — J95811 Postprocedural pneumothorax: Secondary | ICD-10-CM

## 2013-07-08 DIAGNOSIS — I2119 ST elevation (STEMI) myocardial infarction involving other coronary artery of inferior wall: Secondary | ICD-10-CM

## 2013-07-08 LAB — GLUCOSE, CAPILLARY
GLUCOSE-CAPILLARY: 88 mg/dL (ref 70–99)
Glucose-Capillary: 106 mg/dL — ABNORMAL HIGH (ref 70–99)
Glucose-Capillary: 124 mg/dL — ABNORMAL HIGH (ref 70–99)
Glucose-Capillary: 111 mg/dL — ABNORMAL HIGH (ref 70–99)
Glucose-Capillary: 133 mg/dL — ABNORMAL HIGH (ref 70–99)
Glucose-Capillary: 163 mg/dL — ABNORMAL HIGH (ref 70–99)
Glucose-Capillary: 34 mg/dL — CL (ref 70–99)
Glucose-Capillary: 64 mg/dL — ABNORMAL LOW (ref 70–99)
Glucose-Capillary: 70 mg/dL (ref 70–99)
Glucose-Capillary: 58 mg/dL — ABNORMAL LOW (ref 70–99)
Glucose-Capillary: 74 mg/dL (ref 70–99)

## 2013-07-08 LAB — TROPONIN I: TROPONIN I: 7.11 ng/mL — AB (ref <0.30)

## 2013-07-08 LAB — BASIC METABOLIC PANEL
ANION GAP: 16 — AB (ref 5–15)
Anion gap: 13 (ref 5–15)
Anion gap: 15 (ref 5–15)
Anion gap: 15 (ref 5–15)
Anion gap: 15 (ref 5–15)
BUN: 5 mg/dL — AB (ref 6–23)
BUN: 5 mg/dL — ABNORMAL LOW (ref 6–23)
BUN: 6 mg/dL (ref 6–23)
BUN: 6 mg/dL (ref 6–23)
BUN: 7 mg/dL (ref 6–23)
CHLORIDE: 107 meq/L (ref 96–112)
CHLORIDE: 107 meq/L (ref 96–112)
CHLORIDE: 107 meq/L (ref 96–112)
CHLORIDE: 109 meq/L (ref 96–112)
CO2: 17 mEq/L — ABNORMAL LOW (ref 19–32)
CO2: 17 meq/L — AB (ref 19–32)
CO2: 18 mEq/L — ABNORMAL LOW (ref 19–32)
CO2: 18 meq/L — AB (ref 19–32)
CO2: 20 meq/L (ref 19–32)
CREATININE: 0.44 mg/dL — AB (ref 0.50–1.35)
Calcium: 8 mg/dL — ABNORMAL LOW (ref 8.4–10.5)
Calcium: 8 mg/dL — ABNORMAL LOW (ref 8.4–10.5)
Calcium: 8.3 mg/dL — ABNORMAL LOW (ref 8.4–10.5)
Calcium: 8.3 mg/dL — ABNORMAL LOW (ref 8.4–10.5)
Calcium: 8.5 mg/dL (ref 8.4–10.5)
Chloride: 108 mEq/L (ref 96–112)
Creatinine, Ser: 0.45 mg/dL — ABNORMAL LOW (ref 0.50–1.35)
Creatinine, Ser: 0.46 mg/dL — ABNORMAL LOW (ref 0.50–1.35)
Creatinine, Ser: 0.53 mg/dL (ref 0.50–1.35)
Creatinine, Ser: 0.68 mg/dL (ref 0.50–1.35)
GFR calc Af Amer: >90 mL/min (ref >90)
GFR calc Af Amer: >90 mL/min (ref >90)
GFR calc Af Amer: >90 mL/min (ref >90)
GFR calc Af Amer: >90 mL/min (ref >90)
GFR calc non Af Amer: >90 mL/min (ref >90)
GFR calc non Af Amer: >90 mL/min (ref >90)
GFR calc non Af Amer: >90 mL/min (ref >90)
GFR calc non Af Amer: >90 mL/min (ref >90)
GLUCOSE: 145 mg/dL — AB (ref 70–99)
GLUCOSE: 137 mg/dL — AB (ref 70–99)
GLUCOSE: 129 mg/dL — AB (ref 70–99)
Glucose, Bld: 118 mg/dL — ABNORMAL HIGH (ref 70–99)
Glucose, Bld: 181 mg/dL — ABNORMAL HIGH (ref 70–99)
POTASSIUM: 3.7 meq/L (ref 3.7–5.3)
POTASSIUM: 3.9 meq/L (ref 3.7–5.3)
POTASSIUM: 4 meq/L (ref 3.7–5.3)
POTASSIUM: 4.1 meq/L (ref 3.7–5.3)
Potassium: 4.5 mEq/L (ref 3.7–5.3)
SODIUM: 140 meq/L (ref 137–147)
SODIUM: 142 meq/L (ref 137–147)
SODIUM: 140 meq/L (ref 137–147)
Sodium: 140 mEq/L (ref 137–147)
Sodium: 140 mEq/L (ref 137–147)

## 2013-07-08 LAB — CBC
HCT: 36.3 % — ABNORMAL LOW (ref 39.0–52.0)
HEMOGLOBIN: 12.6 g/dL — AB (ref 13.0–17.0)
MCH: 32.4 pg (ref 26.0–34.0)
MCHC: 34.7 g/dL (ref 30.0–36.0)
MCV: 93.3 fL (ref 78.0–100.0)
PLATELETS: 224 10*3/uL (ref 150–400)
RBC: 3.89 MIL/uL — AB (ref 4.22–5.81)
RDW: 14.1 % (ref 11.5–15.5)
WBC: 5.7 10*3/uL (ref 4.0–10.5)

## 2013-07-08 LAB — URINE CULTURE
Colony Count: NO GROWTH
Culture: NO GROWTH

## 2013-07-08 MED ORDER — LORAZEPAM 2 MG/ML IJ SOLN
1.0000 mg | Freq: Two times a day (BID) | INTRAMUSCULAR | Status: DC | PRN
  Filled 2013-07-08 (×2): qty 1

## 2013-07-08 MED ORDER — DEXTROSE 50 % IV SOLN
INTRAVENOUS | Status: AC
  Filled 2013-07-08: qty 50

## 2013-07-08 MED ORDER — POTASSIUM CHLORIDE 10 MEQ/50ML IV SOLN: 10.0000 meq | INTRAVENOUS | Status: DC

## 2013-07-08 MED ORDER — LORAZEPAM 2 MG/ML IJ SOLN
4.0000 mg | Freq: Once | INTRAMUSCULAR | Status: AC
  Administered 2013-07-08: 4 mg via INTRAVENOUS

## 2013-07-08 MED ORDER — DEXTROSE 10 % IV SOLN
INTRAVENOUS | Status: DC
  Administered 2013-07-08: 40 mL/h via INTRAVENOUS
  Administered 2013-07-09 (×2): via INTRAVENOUS
  Administered 2013-07-10: 30 mL/h via INTRAVENOUS
  Administered 2013-07-10: 04:00:00 via INTRAVENOUS

## 2013-07-08 MED ORDER — LEVETIRACETAM 500 MG/5ML IV SOLN: 2000.0000 mg | Freq: Once | INTRAVENOUS | Status: DC

## 2013-07-08 MED ORDER — SODIUM CHLORIDE 0.9 % IV SOLN
250.0000 mL | INTRAVENOUS | Status: DC | PRN
  Administered 2013-07-09: 02:00:00 via INTRAVENOUS

## 2013-07-08 MED ORDER — LEVETIRACETAM IN NACL 1000 MG/100ML IV SOLN
1000.0000 mg | Freq: Two times a day (BID) | INTRAVENOUS | Status: DC
  Administered 2013-07-08: 1000 mg via INTRAVENOUS
  Filled 2013-07-08 (×2): qty 100

## 2013-07-08 MED ORDER — LEVETIRACETAM IN NACL 500 MG/100ML IV SOLN
500.0000 mg | Freq: Two times a day (BID) | INTRAVENOUS | Status: DC
  Administered 2013-07-09 – 2013-07-13 (×9): 500 mg via INTRAVENOUS
  Filled 2013-07-08 (×12): qty 100

## 2013-07-08 MED ORDER — DEXTROSE 50 % IV SOLN
1.0000 | Freq: Once | INTRAVENOUS | Status: AC
  Administered 2013-07-08: 50 mL via INTRAVENOUS

## 2013-07-08 NOTE — Progress Notes (Signed)
Ref: Default, Provider, MD   Subjective:  Intubated and sedated. T max 95.7 F Echocardiogram with mild generalized hypokinesia with EF 45-50 %. Trivial MR and TR. Troponin-7.1  Objective:  Vital Signs in the last 24 hours: Temp:  [90.5 F (32.5 C)-95.7 F (35.4 C)] 95.7 F (35.4 C) (07/05 1100) Pulse Rate:  [43-74] 74 (07/05 1100) Cardiac Rhythm:  [-] Sinus bradycardia;Other (Comment) (07/05 0800) Resp:  [10-25] 25 (07/05 1100) BP: (113-161)/(61-81) 118/61 mmHg (07/05 0351) SpO2:  [95 %-100 %] 96 % (07/05 1100) Arterial Line BP: (90-161)/(49-85) 108/51 mmHg (07/05 1100) FiO2 (%):  [30 %-40 %] 30 % (07/05 0850) Weight:  [82.2 kg (181 lb 3.5 oz)] 82.2 kg (181 lb 3.5 oz) (07/05 0433)  Physical Exam: BP Readings from Last 1 Encounters:  07/08/13 118/61    Wt Readings from Last 1 Encounters:  07/08/13 82.2 kg (181 lb 3.5 oz)    Weight change: -1.262 kg (-2 lb 12.5 oz)  HEENT: [place]/AT, Eyes-Hazel, Conjunctiva-Pink, Sclera-Non-icteric. Intubated. Neck: No JVD, No bruit, Trachea midline. Lungs:  Clear, Bilateral. Cardiac:  Regular rhythm, normal S1 and S2, no S3. II/VI systolic murmur. Abdomen:  Soft, non-tender. Extremities:  No edema present. No cyanosis. No clubbing. CNS: AxOx0. Skin: Cool and dry.   Intake/Output from previous day: 07/04 0701 - 07/05 0700 In: 3582.4 [I.V.:1482.4; IV Piggyback:2100] Out: 2004 [Urine:1904; Emesis/NG output:100]    Lab Results: BMET    Component Value Date/Time   NA 140 07/08/2013 0800   NA 140 07/08/2013 0345   NA 140 07/07/2013 2330   K 3.9 07/08/2013 0800   K 3.7 07/08/2013 0345   K 4.0 07/07/2013 2330   CL 108 07/08/2013 0800   CL 107 07/08/2013 0345   CL 107 07/07/2013 2330   CO2 17* 07/08/2013 0800   CO2 17* 07/08/2013 0345   CO2 18* 07/07/2013 2330   GLUCOSE 137* 07/08/2013 0800   GLUCOSE 145* 07/08/2013 0345   GLUCOSE 118* 07/07/2013 2330   BUN 5* 07/08/2013 0800   BUN 6 07/08/2013 0345   BUN 7 07/07/2013 2330   CREATININE 0.46* 07/08/2013 0800   CREATININE 0.45* 07/08/2013 0345   CREATININE 0.44* 07/07/2013 2330   CALCIUM 8.0* 07/08/2013 0800   CALCIUM 8.5 07/08/2013 0345   CALCIUM 8.3* 07/07/2013 2330   GFRNONAA >90 07/08/2013 0800   GFRNONAA >90 07/08/2013 0345   GFRNONAA >90 07/07/2013 2330   GFRAA >90 07/08/2013 0800   GFRAA >90 07/08/2013 0345   GFRAA >90 07/07/2013 2330   CBC    Component Value Date/Time   WBC 5.7 07/08/2013 0345   RBC 3.89* 07/08/2013 0345   HGB 12.6* 07/08/2013 0345   HCT 36.3* 07/08/2013 0345   PLT 224 07/08/2013 0345   MCV 93.3 07/08/2013 0345   MCH 32.4 07/08/2013 0345   MCHC 34.7 07/08/2013 0345   RDW 14.1 07/08/2013 0345   LYMPHSABS 1.9 06/27/2013 1935   MONOABS 0.5 06/27/2013 1935   EOSABS 0.2 06/27/2013 1935   BASOSABS 0.0 06/27/2013 1935   HEPATIC Function Panel  Recent Labs  07/06/13 2145 07/07/13 0020  PROT 6.3 6.3   HEMOGLOBIN A1C No components found with this basename: HGA1C,  MPG   CARDIAC ENZYMES Lab Results  Component Value Date   CKTOTAL 128 10/19/2008   CKMB 2.1 10/19/2008   TROPONINI 7.11* 07/08/2013   TROPONINI >20.00* 07/07/2013   TROPONINI >20.00* 07/07/2013   BNP  Recent Labs  02/01/13 1221 06/27/13 1933  PROBNP 192.7* 146.2*   TSH No results  found for this basename: TSH,  in the last 8760 hours CHOLESTEROL No results found for this basename: CHOL,  in the last 8760 hours  Scheduled Meds: . sodium chloride  2,000 mL Intravenous Once  . albuterol  3 mL Inhalation Q6H  . antiseptic oral rinse  15 mL Mouth Rinse QID  . aspirin  81 mg Oral Daily  . aspirin  300 mg Rectal Daily  . atorvastatin  80 mg Oral q1800  . benztropine  0.5 mg Per Tube Daily  . chlorhexidine  15 mL Mouth Rinse BID  . folic acid  1 mg Per Tube Daily  . heparin  5,000 Units Subcutaneous 3 times per day  . insulin aspart  2-6 Units Subcutaneous 6 times per day  . metoprolol  5 mg Intravenous 4 times per day  . multivitamin with minerals  1 tablet Oral Daily  . pantoprazole (PROTONIX) IV  40 mg Intravenous QHS  .  thiamine  100 mg Per Tube Daily  . ticagrelor  90 mg Oral BID   Continuous Infusions: . amiodarone    . cisatracurium (NIMBEX) infusion 1.25 mcg/kg/min (07/08/13 0237)  . fentaNYL infusion INTRAVENOUS 25 mcg/hr (07/08/13 0825)  . midazolam (VERSED) infusion 7 mg/hr (07/08/13 0624)  . nitroGLYCERIN Stopped (07/07/13 0630)  . norepinephrine (LEVOPHED) Adult infusion 5 mcg/min (07/08/13 1027)   PRN Meds:.sodium chloride, acetaminophen, artificial tears, cisatracurium, fentaNYL, hydrALAZINE, midazolam, ondansetron (ZOFRAN) IV  Assessment/Plan: Acute inferior wall myocardial infarction  Status post V. fib cardiac arrest  S/P Angioplasty  Sinus bradycardia  Acute respiratory failure secondary to above  Hypertension  History of bronchial asthma  GERD  History of schizoaffective disorder  EtOH abuse  Chronic pain syndrome  Follow with CCM.   LOS: 2 days    Dixie Dials  MD  07/08/2013, 11:15 AM

## 2013-07-08 NOTE — Progress Notes (Deleted)
[facility] ADULT ICU REPLACEMENT PROTOCOL FOR AM LAB REPLACEMENT ONLY  The patient does apply for the [facility] Adult ICU Electrolyte Replacment Protocol based on the criteria listed below:   1. Is GFR >/= 40 ml/min? Yes.    Patient's GFR today is >90 2. Is urine output >/= 0.5 ml/kg/hr for the last 6 hours? Yes.   Patient's UOP is 0.5 ml/kg/hr 3. Is BUN < 60 mg/dL? Yes.    Patient's BUN today is 6 4. Abnormal electrolyte(s): K+3.7 5. Ordered repletion with: protocol 6. If a panic level lab has been reported, has the CCM MD in charge been notified? Yes.  .   Physician:  E Deterding  Concepcion [facility] 07/08/2013 4:46 AM

## 2013-07-08 NOTE — Progress Notes (Signed)
At 1430 seizure like activity noted. HR increased to 140's-150's, pt sats in 80's. RT in room to assess. Breathing tx given. Pt placed on FiO2 of 100%, sats 100%. Dr Lake Bells notified, 4mg  Versed given as ordered.

## 2013-07-08 NOTE — Progress Notes (Signed)
Rewarming initiated at 235

## 2013-07-08 NOTE — Progress Notes (Signed)
LTVM started

## 2013-07-08 NOTE — Progress Notes (Signed)
1530 CBG 33, 1 amp D50 given. F/U CBG at 1545- 163.  1600 pt began to exhibit increasing seizure activity. Dr Elsworth Soho notified of activity, increasing BP and de-sat during event which lasted approx 2 min before returning to previous fine seizure-like activity.

## 2013-07-08 NOTE — Progress Notes (Signed)
Dr Lake Bells into assess. Pt movements have become less pronounced, but he remains visibly tremulous throughout.

## 2013-07-08 NOTE — Progress Notes (Signed)
PULMONARY  / CRITICAL CARE MEDICINE  Name: Chase Blackwell MRN: [id number] DOB: 1965-07-18 PCP Default, Provider, MD   ADMISSION DATE:  Aug 05, 2013 LOS 2 days    PRIMARY SERVICE: CCM  CHIEF COMPLAINT:  VF Arrest -> Inferior STEMI (Cooling Protocol)  CARDIOLOGY: Harwani/Kadakia  BRIEF PATIENT DESCRIPTION:   48 y/o male alcoholic presented to ER with VF arrest and inferior STEMI on 08-06-22. Cooling protocol started and taken to cath lab.   LINES / TUBES: ETT 08-06-2022 R IJ CVL 7/4 L Rad art line 7/4   CULTURES: None  ANTIBIOTICS: None   SIGNIFICANT EVENTS / STUDIES:  2022/08/06 Intubated in field 08/06/2022 Taken to cath lab RCA occluded. Left system ok. S/p PCI 08-06-2022 Hypothermia protocol initiated     SUBJECTIVE:  PTX stable to improved. No new issues  VITAL SIGNS: Filed Vitals:   07/08/13 0630 07/08/13 0700 07/08/13 0800 07/08/13 0850  BP:      Pulse: 65 64 58   Temp:  93.9 F (34.4 C) 94.3 F (34.6 C)   TempSrc:  Core (Comment) Core (Comment)   Resp: 25 25 25    Height:      Weight:      SpO2: 97% 97% 96% 96%      HEMODYNAMICS: Bradycardia. CV stable   VENTILATOR SETTINGS: Vent Mode:  [-] PRVC FiO2 (%):  [30 %-40 %] 30 % Set Rate:  [25 bmp] 25 bmp Vt Set:  [600 mL] 600 mL PEEP:  [5 cmH20] 5 cmH20 Plateau Pressure:  [19 cmH20-20 cmH20] 20 cmH20  INTAKE / OUTPUT: I/O last 3 completed shifts: In: 8351.4 [I.V.:5995.4; IV Piggyback:2356] Out: 7591 [MBWGY:6599; Emesis/NG output:100]    PHYSICAL EXAMINATION: General:  Intubated. unkempt Neuro:  Intubated sedated, paralyzed HEENT:  Normal except for ETT and poor dentition Cardiovascular:  RRR no obvious murmurs or gallops Lungs:  Distant BS,   Abdomen:  Soft. NT/ND good BS Extremities: No c/c/e. DPs 2+ bilaterally  Interosseous line Musculoskeletal:  Normal tone Skin:  No rash   LABS: PULMONARY  Recent Labs Lab 08/05/2013 2158 07/07/13 0028 07/07/13 0232 07/07/13 0409 07/07/13 0610  PHART 7.250*  --    --   --   --   PCO2ART 41.9  --   --   --   --   PO2ART 89.0  --   --   --   --   HCO3 18.3*  --   --   --   --   TCO2 20 17 16 16 17   O2SAT 95.0  --   --   --   --     CBC  Recent Labs Lab 08/05/13 2145 07/07/13 0020  07/07/13 0409 07/07/13 0610 07/08/13 0345  HGB 13.9 13.4  < > 14.6 13.6 12.6*  HCT 40.6 39.1  < > 43.0 40.0 36.3*  WBC 15.2* 16.2*  --   --   --  5.7  PLT 288 281  --   --   --  224  < > = values in this interval not displayed.  COAGULATION  Recent Labs Lab 08-05-2013 2145 07/07/13 0030 07/07/13 1652  INR 1.06 4.12* 1.12    CARDIAC    Recent Labs Lab 2013-08-05 2145 Aug 05, 2013 2359 07/07/13 0330 07/07/13 0800 07/08/13 0800  TROPONINI 0.59* 13.38* >20.00* >20.00* 7.11*   No results found for this basename: PROBNP,  in the last 168 hours   CHEMISTRY  Recent Labs Lab 07/07/13 0300  07/07/13 1600 07/07/13 1945 07/07/13 2330 07/08/13 0345 07/08/13  0800  NA  --   < > 141 141 140 140 140  K  --   < > 4.0 3.1* 4.0 3.7 3.9  CL  --   < > 106 108 107 107 108  CO2  --   < > 19 18* 18* 17* 17*  GLUCOSE  --   < > 156* 99 118* 145* 137*  BUN  --   < > 10 8 7 6  5*  CREATININE  --   < > 0.49* 0.42* 0.44* 0.45* 0.46*  CALCIUM  --   < > 8.3* 8.0* 8.3* 8.5 8.0*  MG 1.6  --   --   --   --   --   --   < > = values in this interval not displayed. Estimated Creatinine Clearance: 121.6 ml/min (by C-G formula based on Cr of 0.46).   LIVER  Recent Labs Lab 07/06/13 2145 07/07/13 0020 07/07/13 0030 07/07/13 1652  AST 60* 85*  --   --   ALT 30 32  --   --   ALKPHOS 63 57  --   --   BILITOT 0.2* 0.5  --   --   PROT 6.3 6.3  --   --   ALBUMIN 3.5 3.8  --   --   INR 1.06  --  4.12* 1.12     INFECTIOUS  Recent Labs Lab 07/07/13 0315  LATICACIDVEN 0.7     ENDOCRINE CBG (last 3)   Recent Labs  07/07/13 2000 07/07/13 2336 07/08/13 0349  GLUCAP 88 106* 124*         IMAGING x48h  Cxrs reviewed. 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  Airway obstruction Apical PTX from CVL placement, tiny and not needing Rx P:  Vent bundle      Wean as tolerated after hypothermia complete      BDs   Monitor CXR  CARDIOVASCULAR A: 1) Ventricular fibrillation arrest      2) Acute inferior STEM due to occluded RCA P: PCI of RCA per cardiology done per . Continue DAPT, ASA, Statin      Hypothermia protocol to conclude today at [facility] with rewarming target temp time   GASTROINTESTINAL A:  Stress ulcer prophylaxis P:  PPI  NPO for now   NEUROLOGIC A:  ETOH abuse P:  Check ETOH level.       ETOH WD protocol once off hypothermia.       Had 15 mins CPR. Will need assessment for anoxic brain injury once hypothermia complete.    GLOBAL A: Heparin for DVT prophylaxis  Todays summary:  Vf arrest  IWMI, RCA stent.  Pt on cooling protocol to finish at 2pm 7/5. Small R apical PTX, no change on f/u film, observe. Cont  BDs  The patient is critically ill with multiple organ systems failure and requires high complexity decision making for assessment and support, frequent evaluation and titration of therapies, application of advanced monitoring technologies and extensive interpretation of multiple databases.   Critical  Care Time devoted to patient care services described in this note is  30  Minutes.  Erik Obey Beeper  [phone]  Cell  [phone]  If no response or cell goes to voicemail, call beeper [phone]  Critical Care Medicine 8:59  AM 07/08/2013 8:59 AM

## 2013-07-08 NOTE — Consult Note (Signed)
Reason for Consult: Seizures Referring Physician: Dr Spero Curb  CC: Seizures  HPI: Chase Blackwell is a 48 y.o. male with a history of hypertension, asthma, GERD, schizoaffective disorder, alcohol abuse, and chronic pain syndrome, who was found by a neighbor 07/06/2013 in the [address] unresponsive. He was brought to the emergency department in VF arrest with an inferior MI.   He was taken to cath lab and underwent percutaneous intervention with placement of a stent. He was intubated, sedated, and subsequently admitted to the ICU. Today at 1430 seizure-like activity was noted by the R.N. the patient was treated with Versed. A stat head CT and EEG were ordered and the patient was placed on Keppra 1000 mg every 12 hours.    Past Medical History  Diagnosis Date  . Hypertension   . Asthma   . GERD (gastroesophageal reflux disease)   . Chronic pain   . Schizophrenia   . Alcohol abuse     No past surgical history on file.  Family History  Problem Relation Age of Onset  . Malignant hyperthermia Mother   . Cirrhosis Father   . Alcohol abuse Father     Social History:  reports that he has been smoking Cigarettes.  He has been smoking about 1.00 pack per day. He does not have any smokeless tobacco history on file. He reports that he drinks about 1.2 ounces of alcohol per week. He reports that he uses illicit drugs (Marijuana).  Allergies  Allergen Reactions  . Cetirizine & Related     unknown  . Hctz [Hydrochlorothiazide] Other (See Comments)    Dizzy spells  . Hydroxyzine Hives  . Sulfonamide Derivatives Hives    Medications:  Scheduled: . sodium chloride  2,000 mL Intravenous Once  . albuterol  3 mL Inhalation Q6H  . antiseptic oral rinse  15 mL Mouth Rinse QID  . aspirin  81 mg Oral Daily  . aspirin  300 mg Rectal Daily  . atorvastatin  80 mg Oral q1800  . benztropine  0.5 mg Per Tube Daily  . chlorhexidine  15 mL Mouth Rinse BID  . folic acid  1 mg Per Tube Daily  . heparin   5,000 Units Subcutaneous 3 times per day  . insulin aspart  2-6 Units Subcutaneous 6 times per day  . levETIRAcetam  1,000 mg Intravenous Q12H  . metoprolol  5 mg Intravenous 4 times per day  . multivitamin with minerals  1 tablet Oral Daily  . pantoprazole (PROTONIX) IV  40 mg Intravenous QHS  . thiamine  100 mg Per Tube Daily  . ticagrelor  90 mg Oral BID    ROS: Unobtainable this time.  Physical Examination: Blood pressure 118/61, pulse 111, temperature 99.9 F (37.7 C), temperature source Core (Comment), resp. rate 25, height 5\' 11"  (1.803 m), weight 181 lb 3.5 oz (82.2 kg), SpO2 100.00%.  Neurologic Examination Mental Status: Patient does not respond to verbal stimuli.  Does not respond to deep sternal rub.  Does not follow commands.  No verbalizations noted.  Cranial Nerves: II: patient does not respond confrontation bilaterally, pupils right 2 mm, left 2 mm,and unreactive bilaterally III,IV,VI: doll's response difficult to test due to the rigidity of the patient V,VII: corneal reflex absent bilaterally  VIII: patient does not respond to verbal stimuli IX,X: gag reflex reduced, XI: trapezius strength unable to test bilaterally XII: tongue strength unable to test Motor: Arms able to be bent actively approximately 45 degrees then become very stiff in the  range.  Legs extended and stiff-increased tone.  Tremors noted throughout Sensory: Does not respond to noxious stimuli in any extremity. Deep Tendon Reflexes:  2+ in the upper extremities and at the knees.  Unable to be elicited at the ankles. Plantars: mute bilaterally Cerebellar: Unable to perform     Laboratory Studies:   Basic Metabolic Panel:  Recent Labs Lab 07/07/13 0300  07/07/13 1945 07/07/13 2330 07/08/13 0345 07/08/13 0800 07/08/13 1200  NA  --   < > 141 140 140 140 142  K  --   < > 3.1* 4.0 3.7 3.9 4.1  CL  --   < > 108 107 107 108 109  CO2  --   < > 18* 18* 17* 17* 18*  GLUCOSE  --   < > 99  118* 145* 137* 129*  BUN  --   < > 8 7 6  5* 5*  CREATININE  --   < > 0.42* 0.44* 0.45* 0.46* 0.53  CALCIUM  --   < > 8.0* 8.3* 8.5 8.0* 8.3*  MG 1.6  --   --   --   --   --   --   < > = values in this interval not displayed.  Liver Function Tests:  Recent Labs Lab 07/06/13 2145 07/07/13 0020  AST 60* 85*  ALT 30 32  ALKPHOS 63 57  BILITOT 0.2* 0.5  PROT 6.3 6.3  ALBUMIN 3.5 3.8   No results found for this basename: LIPASE, AMYLASE,  in the last 168 hours No results found for this basename: AMMONIA,  in the last 168 hours  CBC:  Recent Labs Lab 07/05/13 2046 07/06/13 2145 07/07/13 0020 07/07/13 0028 07/07/13 0232 07/07/13 0409 07/07/13 0610 07/08/13 0345  WBC 9.9 15.2* 16.2*  --   --   --   --  5.7  HGB 13.5 13.9 13.4 13.9 15.0 14.6 13.6 12.6*  HCT 38.0* 40.6 39.1 41.0 44.0 43.0 40.0 36.3*  MCV 92.5 95.5 94.4  --   --   --   --  93.3  PLT 280 288 281  --   --   --   --  224    Cardiac Enzymes:  Recent Labs Lab 07/06/13 2145 07/06/13 2359 07/07/13 0330 07/07/13 0800 07/08/13 0800  TROPONINI 0.59* 13.38* >20.00* >20.00* 7.11*    BNP: No components found with this basename: POCBNP,   CBG:  Recent Labs Lab 07/08/13 0349 07/08/13 0758 07/08/13 1152 07/08/13 1527 07/08/13 1548  GLUCAP 124* 133* 111* 52* 163*    Microbiology: Results for orders placed during the hospital encounter of 07/06/13  MRSA PCR SCREENING     Status: None   Collection Time    07/06/13 11:38 PM      Result Value Ref Range Status   MRSA by PCR NEGATIVE  NEGATIVE Final   Comment:            The GeneXpert MRSA Assay (FDA     approved for NASAL specimens     only), is one component of a     comprehensive MRSA colonization     surveillance program. It is not     intended to diagnose MRSA     infection nor to guide or     monitor treatment for     MRSA infections.  URINE CULTURE     Status: None   Collection Time    07/06/13 11:52 PM      Result Value Ref Range Status  Specimen Description URINE, CATHETERIZED   Final   Special Requests NONE   Final   Culture  Setup Time     Final   Value: 07/07/2013 12:52     Performed at [facility]     Final   Value: NO GROWTH     Performed at [facility]   Culture     Final   Value: NO GROWTH     Performed at [facility]   Report Status 07/08/2013 FINAL   Final    Coagulation Studies:  Recent Labs  07/06/13 2145 07/07/13 0030 07/07/13 1652  LABPROT 13.8 39.9* 14.4  INR 1.06 4.12* 1.12    Urinalysis:  Recent Labs Lab 07/06/13 2352  COLORURINE YELLOW  LABSPEC 1.040*  PHURINE 6.0  GLUCOSEU NEGATIVE  HGBUR TRACE*  BILIRUBINUR NEGATIVE  KETONESUR NEGATIVE  PROTEINUR NEGATIVE  UROBILINOGEN 0.2  NITRITE NEGATIVE  LEUKOCYTESUR NEGATIVE    Lipid Panel:  No results found for this basename: chol, trig, hdl, cholhdl, vldl, ldlcalc    HgbA1C:  No results found for this basename: HGBA1C    Urine Drug Screen:     Component Value Date/Time   LABOPIA NONE DETECTED 07/06/2013 2352   COCAINSCRNUR NONE DETECTED 07/06/2013 2352   LABBENZ POSITIVE* 07/06/2013 2352   AMPHETMU NONE DETECTED 07/06/2013 2352   THCU POSITIVE* 07/06/2013 2352   LABBARB NONE DETECTED 07/06/2013 2352    Alcohol Level:  Recent Labs Lab 07/05/13 2046 07/06/13 2145  ETH 206* <11    Other results: EKG: Sinus bradycardia rate 43 beats per minute. Please refer to the formal reading for complete details.  Imaging:  Dg Chest Port 1 View 07/08/2013    1. Support apparatus satisfactory.  2. Resolution of right apical pneumothorax.  3. Mild right basilar atelectasis. No acute cardiopulmonary disease otherwise.      Dg Chest Port 1 View 07/07/2013    Stable tiny right apical pneumothorax.    Dg Chest Port 1 View 07/07/2013    1. Endotracheal tube and central line in good position. The orogastric tube enters the stomach, but could be advanced 5 cm for more secure positioning.  2. Tiny  right-sided pneumothorax.    Mikey Bussing PA-C [facility] Pager [phone] 07/08/2013, 4:43 PM  Patient seen and examined.  Clinical course and management discussed.  Necessary edits performed.  I agree with the above.  Assessment and plan of care developed and discussed below.    Assessment/Plan:   48 year old male found down for an unknown period of time s/p hyperthermia.  On warming was noted to have shaking that was generalized and felt clinically to be seizure activity.  Patient was sedated and started on Keppra with continued activity.  Head CT reviewed from admission and repeat today.  No change in CT was noted and no acute changes seen.  Continuous EEG connected and shows only muscle artifact. There was no change in the background activity with Ativan.    Recommendations: 1.  Continue Keppra with maintenance of 500mg  BID 2.  Background activity on EEG marred by artifact but minimal.  Prognostically patient may benefit from a NM perfusion study since patient will likely not be able to come off sedation which can potentially influence neurological examination. 3.  Will continue EEG monitoring overnight and if no change in background will discontinue in AM    Alexis Goodell, MD [facility] [phone]  07/08/2013  5:50 PM

## 2013-07-08 NOTE — Progress Notes (Signed)
  Echocardiogram 2D Echocardiogram has been performed.  Mauricio Po 07/08/2013, 8:47 AM

## 2013-07-08 NOTE — Progress Notes (Signed)
[facility] P[facility]M  Called to bedside for seizure activity post rewarming and stopping cisatracurium; this occurred on versed gtt at 7mg /hr, resolved with versed bolus of 4mg  per my ordered.  Filed Vitals:   07/08/13 1300 07/08/13 1336 07/08/13 1400 07/08/13 1433  BP:      Pulse: 80  96   Temp: 96.6 F (35.9 C) 96.8 F (36 C) 97.3 F (36.3 C) 99.3 F (37.4 C)  TempSrc: Core (Comment) Core (Comment) Core (Comment) Core (Comment)  Resp: 25  20   Height:      Weight:      SpO2: 95%  95% 90%   Gen: sedated on vent HEENT: NCAT, ETT, upward gaze, Pupils round and reactive PULM: Crackles bilaterally CV: tachy, regular, s1/s2 AB: arctic sun pads in place Ext: warm Neuro: GCS 3, fine tremor on face, no other movements on my exam  Impression 1) post arrest abnormal movements> seizure vs myoclonus, not witnessed by me 2) Cardiac arrest 3) Acute respiratory failure  Plan: 1) Stat Head CT 2) Stat EEG (called neurology) 3) Keppra now 4) continue versed gtt 5) Maintain normothermia with arctic sun  Family updated at bedside by me   CC time by me in addition to Dr. Joya Gaskins 50 minutes  Roselie Awkward, MD [place] [facility] Pager: [phone] Cell: [phone] If no response, call [phone]

## 2013-07-08 NOTE — Progress Notes (Signed)
Transported pt to Ct scan on vent and back to unit.  No problems.

## 2013-07-09 ENCOUNTER — Inpatient Hospital Stay (HOSPITAL_COMMUNITY): Payer: Medicare Other

## 2013-07-09 DIAGNOSIS — R4020 Unspecified coma: Secondary | ICD-10-CM

## 2013-07-09 DIAGNOSIS — R4182 Altered mental status, unspecified: Secondary | ICD-10-CM

## 2013-07-09 LAB — POCT ACTIVATED CLOTTING TIME: ACTIVATED CLOTTING TIME: 360 s

## 2013-07-09 LAB — GLUCOSE, CAPILLARY
GLUCOSE-CAPILLARY: 56 mg/dL — AB (ref 70–99)
GLUCOSE-CAPILLARY: 86 mg/dL (ref 70–99)
GLUCOSE-CAPILLARY: 73 mg/dL (ref 70–99)
Glucose-Capillary: 74 mg/dL (ref 70–99)
Glucose-Capillary: 78 mg/dL (ref 70–99)
Glucose-Capillary: 78 mg/dL (ref 70–99)
Glucose-Capillary: 88 mg/dL (ref 70–99)

## 2013-07-09 LAB — BLOOD GAS, ARTERIAL
Acid-base deficit: 5.1 mmol/L — ABNORMAL HIGH (ref 0.0–2.0)
Bicarbonate: 21.3 mEq/L (ref 20.0–24.0)
Drawn by: 35849
FIO2: 0.7 %
MECHVT: 600 mL
O2 SAT: 99.6 %
PEEP/CPAP: 5 cmH2O
PH ART: 7.238 — AB (ref 7.350–7.450)
PO2 ART: 151 mmHg — AB (ref 80.0–100.0)
Patient temperature: 97.3
RATE: 25 resp/min
TCO2: 22.9 mmol/L (ref 0–100)
pCO2 arterial: 51 mmHg — ABNORMAL HIGH (ref 35.0–45.0)

## 2013-07-09 LAB — POCT I-STAT 3, ART BLOOD GAS (G3+)
Acid-base deficit: 2 mmol/L (ref 0.0–2.0)
Bicarbonate: 23.1 mEq/L (ref 20.0–24.0)
O2 Saturation: 100 %
PCO2 ART: 42.1 mmHg (ref 35.0–45.0)
PO2 ART: 534 mmHg — AB (ref 80.0–100.0)
TCO2: 24 mmol/L (ref 0–100)
pH, Arterial: 7.348 — ABNORMAL LOW (ref 7.350–7.450)

## 2013-07-09 LAB — CBC
HCT: 37 % — ABNORMAL LOW (ref 39.0–52.0)
HEMOGLOBIN: 12.2 g/dL — AB (ref 13.0–17.0)
MCH: 32 pg (ref 26.0–34.0)
MCHC: 33 g/dL (ref 30.0–36.0)
MCV: 97.1 fL (ref 78.0–100.0)
Platelets: 196 10*3/uL (ref 150–400)
RBC: 3.81 MIL/uL — AB (ref 4.22–5.81)
RDW: 14.7 % (ref 11.5–15.5)
WBC: 4.8 10*3/uL (ref 4.0–10.5)

## 2013-07-09 LAB — POCT I-STAT, CHEM 8
BUN: 9 mg/dL (ref 6–23)
CHLORIDE: 104 meq/L (ref 96–112)
Calcium, Ion: 1.07 mmol/L — ABNORMAL LOW (ref 1.12–1.23)
Creatinine, Ser: 0.6 mg/dL (ref 0.50–1.35)
GLUCOSE: 153 mg/dL — AB (ref 70–99)
HEMATOCRIT: 41 % (ref 39.0–52.0)
Hemoglobin: 13.9 g/dL (ref 13.0–17.0)
POTASSIUM: 3.8 meq/L (ref 3.7–5.3)
Sodium: 137 mEq/L (ref 137–147)
TCO2: 20 mmol/L (ref 0–100)

## 2013-07-09 LAB — BASIC METABOLIC PANEL
Anion gap: 13 (ref 5–15)
BUN: 7 mg/dL (ref 6–23)
CHLORIDE: 106 meq/L (ref 96–112)
CO2: 20 meq/L (ref 19–32)
CREATININE: 0.7 mg/dL (ref 0.50–1.35)
Calcium: 8.4 mg/dL (ref 8.4–10.5)
GFR calc Af Amer: >90 mL/min (ref >90)
GFR calc non Af Amer: >90 mL/min (ref >90)
GLUCOSE: 85 mg/dL (ref 70–99)
Potassium: 4.8 mEq/L (ref 3.7–5.3)
Sodium: 139 mEq/L (ref 137–147)

## 2013-07-09 LAB — HIV ANTIBODY (ROUTINE TESTING W REFLEX): HIV: NONREACTIVE

## 2013-07-09 LAB — TSH: TSH: 0.261 u[IU]/mL — AB (ref 0.350–4.500)

## 2013-07-09 MED ORDER — VANCOMYCIN HCL IN DEXTROSE 1-5 GM/200ML-% IV SOLN
1000.0000 mg | Freq: Three times a day (TID) | INTRAVENOUS | Status: DC
  Administered 2013-07-09 – 2013-07-11 (×6): 1000 mg via INTRAVENOUS
  Filled 2013-07-09 (×7): qty 200

## 2013-07-09 MED ORDER — LORAZEPAM 2 MG/ML IJ SOLN
2.0000 mg | INTRAMUSCULAR | Status: DC
  Administered 2013-07-09 – 2013-07-12 (×16): 2 mg via INTRAVENOUS
  Filled 2013-07-09 (×17): qty 1

## 2013-07-09 MED ORDER — VITAL 1.5 CAL PO LIQD
1000.0000 mL | ORAL | Status: DC
  Administered 2013-07-09 – 2013-07-12 (×2): 1000 mL
  Filled 2013-07-09 (×6): qty 1000

## 2013-07-09 MED ORDER — SODIUM CHLORIDE 0.9 % IV BOLUS (SEPSIS)
750.0000 mL | Freq: Once | INTRAVENOUS | Status: AC
  Administered 2013-07-09: 750 mL via INTRAVENOUS

## 2013-07-09 MED ORDER — LORAZEPAM 2 MG/ML IJ SOLN
4.0000 mg | Freq: Once | INTRAMUSCULAR | Status: AC
  Administered 2013-07-09: 4 mg via INTRAVENOUS
  Filled 2013-07-09: qty 2

## 2013-07-09 MED ORDER — VITAL HIGH PROTEIN PO LIQD
1000.0000 mL | ORAL | Status: DC
  Administered 2013-07-09: 1000 mL
  Filled 2013-07-09 (×2): qty 1000

## 2013-07-09 MED ORDER — DEXTROSE 50 % IV SOLN
25.0000 mL | Freq: Once | INTRAVENOUS | Status: AC | PRN
  Administered 2013-07-09: 25 mL via INTRAVENOUS

## 2013-07-09 MED ORDER — PRO-STAT SUGAR FREE PO LIQD
30.0000 mL | Freq: Three times a day (TID) | ORAL | Status: DC
  Administered 2013-07-09 – 2013-07-12 (×9): 30 mL
  Filled 2013-07-09 (×11): qty 30

## 2013-07-09 MED ORDER — SODIUM CHLORIDE 0.9 % IV SOLN
3.0000 g | Freq: Four times a day (QID) | INTRAVENOUS | Status: AC
  Administered 2013-07-09 – 2013-07-16 (×29): 3 g via INTRAVENOUS
  Filled 2013-07-09 (×29): qty 3

## 2013-07-09 MED ORDER — SODIUM CHLORIDE 0.9 % IV SOLN
INTRAVENOUS | Status: DC
  Administered 2013-07-09 – 2013-07-20 (×8): via INTRAVENOUS

## 2013-07-09 MED ORDER — DEXTROSE 50 % IV SOLN
INTRAVENOUS | Status: AC
  Administered 2013-07-09: 25 mL via INTRAVENOUS
  Filled 2013-07-09: qty 50

## 2013-07-09 MED ORDER — SODIUM CHLORIDE 0.9 % IV SOLN
3.0000 g | Freq: Four times a day (QID) | INTRAVENOUS | Status: DC
  Administered 2013-07-09: 3 g via INTRAVENOUS
  Filled 2013-07-09 (×4): qty 3

## 2013-07-09 MED FILL — Sodium Chloride IV Soln 0.9%: INTRAVENOUS | Qty: 50 | Status: AC

## 2013-07-09 NOTE — Progress Notes (Signed)
ANTIBIOTIC CONSULT NOTE - INITIAL  Pharmacy Consult for Vancomycin and Unasyn Indication: aspiration pneumonia   Allergies  Allergen Reactions  . Cetirizine & Related     unknown  . Hctz [Hydrochlorothiazide] Other (See Comments)    Dizzy spells  . Hydroxyzine Hives  . Sulfonamide Derivatives Hives    Patient Measurements: Height: 5\' 11"  (180.3 cm) Weight: 183 lb 13.8 oz (83.4 kg) IBW/kg (Calculated) : 75.3   Vital Signs: Temp: 97.3 F (36.3 C) (07/06 1400) Temp src: Core (Comment) (07/06 1200) BP: 97/42 mmHg (07/06 1400) Pulse Rate: 87 (07/06 1400) Intake/Output from previous day: 07/05 0701 - 07/06 0700 In: 2763.8 [I.V.:2563.8; IV Piggyback:200] Out: 2340 [Urine:2340] Intake/Output from this shift: Total I/O In: 2035.1 [I.V.:1090.4; NG/GT:94.7; IV Piggyback:850] Out: 525 [Urine:525]  Labs:  Recent Labs  07/07/13 0020  07/07/13 0610  07/08/13 0345  07/08/13 1200 07/08/13 1600 07/09/13 0400  WBC 16.2*  --   --   --  5.7  --   --   --  4.8  HGB 13.4  < > 13.6  --  12.6*  --   --   --  12.2*  PLT 281  --   --   --  224  --   --   --  196  CREATININE 0.66  < > 0.40*  < > 0.45*  < > 0.53 0.68 0.70  < > = values in this interval not displayed. Estimated Creatinine Clearance: 121.6 ml/min (by C-G formula based on Cr of 0.7).  Medical History: Past Medical History  Diagnosis Date  . Hypertension   . Asthma   . GERD (gastroesophageal reflux disease)   . Chronic pain   . Schizophrenia   . Alcohol abuse    Assessment:  s/p VF arrest, inferior STEMI and hypothermia protocol on 7/3. Rewarmed on 7/5. Atelectasis versus infiltrates on chest xray. Vanc and Unasyn added for possible aspiration.  Blood and sputum cultures ordered. 7/3 urine culture negative. Afebrile, ABC 4.8.   Goal of Therapy:  Vancomycin trough level 15-20 mcg/ml appropriate Unasyn dose for renal function and infection  Plan:   Vancomycin 1 gram IV q8hrs.  Unasyn 3 grams IV q6hrs.  Will  follow renal function, culture data and clinical progress.  Arty Baumgartner, [place] Pager: [phone] 07/09/2013,2:14 PM

## 2013-07-09 NOTE — Progress Notes (Signed)
Hypoglycemic Event  CBG: 56     Treatment: D50 IV 25 mL  Symptoms: None  Follow-up CBG: Time:0826 CBG Result:86  Possible Reasons for Event: Unknown  Comments/MD notified:CCM    [name]  Remember to initiate Hypoglycemia Order Set & complete

## 2013-07-09 NOTE — Progress Notes (Signed)
Subjective:  Patient remains intubated sedated. Had myoclonus twitching. Seen by neurology Objective:  Vital Signs in the last 24 hours: Temp:  [96.6 F (35.9 C)-99.9 F (37.7 C)] 97.3 F (36.3 C) (07/06 1200) Pulse Rate:  [80-142] 80 (07/06 1200) Resp:  [17-29] 21 (07/06 1200) BP: (94-111)/(44-57) 96/50 mmHg (07/06 1200) SpO2:  [90 %-100 %] 100 % (07/06 1200) Arterial Line BP: (84-114)/(40-60) 102/45 mmHg (07/06 1200) FiO2 (%):  [30 %-80 %] 70 % (07/06 1200) Weight:  [83.4 kg (183 lb 13.8 oz)] 83.4 kg (183 lb 13.8 oz) (07/06 0500)  Intake/Output from previous day: 07/05 0701 - 07/06 0700 In: 2763.8 [I.V.:2563.8; IV Piggyback:200] Out: 5366 [Urine:2340] Intake/Output from this shift: Total I/O In: 1478.4 [I.V.:668.4; NG/GT:60; IV Piggyback:750] Out: 375 [Urine:375]  Physical Exam: Neck: no adenopathy, no carotid bruit, no JVD and supple, symmetrical, trachea midline Lungs: Decreased breath sound at bases with occasional rhonchi Heart: regular rate and rhythm, S1, S2 normal and Soft systolic murmur noted no S3 gallop Abdomen: Soft bowel sounds present Extremities: extremities normal, atraumatic, no cyanosis or edema  Lab Results:  Recent Labs  07/08/13 0345 07/09/13 0400  WBC 5.7 4.8  HGB 12.6* 12.2*  PLT 224 196    Recent Labs  07/08/13 1600 07/09/13 0400  NA 140 139  K 4.5 4.8  CL 107 106  CO2 20 20  GLUCOSE 181* 85  BUN 6 7  CREATININE 0.68 0.70    Recent Labs  07/07/13 0800 07/08/13 0800  TROPONINI >20.00* 7.11*   Hepatic Function Panel  Recent Labs  07/06/13 2145 07/07/13 0020  PROT 6.3 6.3  ALBUMIN 3.5 3.8  AST 60* 85*  ALT 30 32  ALKPHOS 63 57  BILITOT 0.2* 0.5  BILIDIR <0.2  --   IBILI NOT CALCULATED  --    No results found for this basename: CHOL,  in the last 72 hours No results found for this basename: PROTIME,  in the last 72 hours  Imaging: Imaging results have been reviewed and Ct Head Wo Contrast  07/08/2013   CLINICAL  DATA:  Cardiac arrest, see using.  EXAM: CT HEAD WITHOUT CONTRAST  TECHNIQUE: Contiguous axial images were obtained from the base of the skull through the vertex without intravenous contrast.  COMPARISON:  05/09/2011  FINDINGS: No acute intracranial abnormality. Specifically, no hemorrhage, hydrocephalus, mass lesion, acute infarction, or significant intracranial injury. No acute calvarial abnormality. Mucosal thickening in the paranasal sinuses. Mastoid air cells are clear. Orbital soft tissues unremarkable.  IMPRESSION: No acute intracranial abnormality.   Electronically Signed   By: Rolm Baptise M.D.   On: 07/08/2013 17:02   Dg Chest Port 1 View  07/09/2013   CLINICAL DATA:  Evaluate endotracheal tube.  EXAM: PORTABLE CHEST - 1 VIEW  COMPARISON:  07/08/2013.  FINDINGS: The support apparatus is stable. No complicating features. External pacer pad holes are unchanged. The cardiac silhouette, mediastinal and hilar contours are within normal limits and unchanged. Persistent perihilar and bibasilar airspace opacities could reflect atelectasis or infiltrates. No pleural effusion or pneumothorax.  IMPRESSION: Stable support apparatus.  Persistent perihilar and bibasilar airspace opacities, atelectasis versus infiltrates.   Electronically Signed   By: Kalman Jewels M.D.   On: 07/09/2013 07:36   Dg Chest Port 1 View  07/08/2013   CLINICAL DATA:  Ventilator dependent respiratory failure. Follow-up right apical pneumothorax. Acute MI.  EXAM: PORTABLE CHEST - 1 VIEW  COMPARISON:  Portable chest x-rays yesterday. Two-view chest x-ray 06/24/2013.  FINDINGS: Endotracheal tube  tip remains in satisfactory position projecting approximately 6-7 cm above the carina. Right jugular central venous catheter tip projects over the lower SVC, unchanged. OG June courses below the diaphragm. External pacing pads overlie the left chest.  Cardiomediastinal silhouette unremarkable and unchanged. Interval resolution of the right apical  pneumothorax. Mild atelectasis at the right lung base. Lungs otherwise clear. No localized airspace consolidation. No pleural effusions. No pneumothorax. Normal pulmonary vascularity.  IMPRESSION: 1. Support apparatus satisfactory. 2. Resolution of right apical pneumothorax. 3. Mild right basilar atelectasis. No acute cardiopulmonary disease otherwise.   Electronically Signed   By: Evangeline Dakin M.D.   On: 07/08/2013 09:12    Cardiac Studies:  Assessment/Plan:  Acute inferior wall myocardial infarction status post PTCA stenting to 100% occluded RCA Status post V. 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  LOS: 3 days    Chase Blackwell N 07/09/2013, 12:42 PM

## 2013-07-09 NOTE — Progress Notes (Signed)
LTM EEG D/C'd per Dr Doy Mince. Results pending.

## 2013-07-09 NOTE — Progress Notes (Signed)
NUTRITION CONSULT/FOLLOW UP  DOCUMENTATION CODES Per approved criteria  -Not Applicable   INTERVENTION: Initiate Vital 1.5 formula at 15 ml/hr and increase by 10 ml every 4 hours to goal rate of 55 ml/hr with Prostat liquid protein 30 ml TID via tube to provide 2280 kcals, 128 gm protein, 1006 ml of free water RD to follow for nutrition care plan  NUTRITION DIAGNOSIS: Inadequate oral intake related to inability to eat as evidenced by NPO, ongoing  Goal: Pt to meet >/= 90% of their estimated nutrition needs, currently unmet  Monitor:  TF regimen & tolerance, respiratory status, weight, labs, I/O's  ASSESSMENT: Pt with PMHx of HTN, asthma, chronic pain, schizophrenia, and alcohol abuse. Presented to ER 7/2 with hip pain after a fall and released. Pt brought in by EMS after successful resuscitation of out of cardiac arrest, was intubated. Initial ECG showed inferior wall myocardial infarction, so code STEMI was activated. He has also been started on cooling protocol.   Patient s/p cardiac cath 7/3.  Patient is currently intubated on ventilator support -- OGT in place MV: 18.4 L/min Temp (24hrs), Avg:97.7 F (36.5 C), Min:97 F (36.1 C), Max:99.9 F (37.7 C)   Neurology consulted for seizures.  No evidence of epileptiform activity on continuous EEG recording.  RD consulted via Adult Tube Feeding Protocol for TF initiation & management.  Height: Ht Readings from Last 1 Encounters:  07/06/13 5\' 11"  (1.803 m)    Weight: Wt Readings from Last 1 Encounters:  07/09/13 183 lb 13.8 oz (83.4 kg)    BMI:  Body mass index is 25.66 kg/(m^2).  Re-estimated needs: Kcal: 2313 Protein: 125-135 gm Fluid: per MD  Skin: Intact   Diet Order: NPO   Intake/Output Summary (Last 24 hours) at 07/09/13 1442 Last data filed at 07/09/13 1400  Gross per 24 hour  Intake 4391.55 ml  Output   1500 ml  Net 2891.55 ml    Labs:   Recent Labs Lab 07/07/13 0300  07/08/13 1200  07/08/13 1600 07/09/13 0400  NA  --   < > 142 140 139  K  --   < > 4.1 4.5 4.8  CL  --   < > 109 107 106  CO2  --   < > 18* 20 20  BUN  --   < > 5* 6 7  CREATININE  --   < > 0.53 0.68 0.70  CALCIUM  --   < > 8.3* 8.0* 8.4  MG 1.6  --   --   --   --   GLUCOSE  --   < > 129* 181* 85  < > = values in this interval not displayed.  CBG (last 3)   Recent Labs  07/09/13 0757 07/09/13 0826 07/09/13 1154  GLUCAP 56* 86 73    Scheduled Meds: . sodium chloride  2,000 mL Intravenous Once  . albuterol  3 mL Inhalation Q6H  . ampicillin-sulbactam (UNASYN) IV  3 g Intravenous Q6H  . antiseptic oral rinse  15 mL Mouth Rinse QID  . aspirin  81 mg Oral Daily  . atorvastatin  80 mg Oral q1800  . benztropine  0.5 mg Per Tube Daily  . chlorhexidine  15 mL Mouth Rinse BID  . feeding supplement (VITAL HIGH PROTEIN)  1,000 mL Per Tube Q24H  . folic acid  1 mg Per Tube Daily  . heparin  5,000 Units Subcutaneous 3 times per day  . insulin aspart  2-6 Units Subcutaneous 6  times per day  . levETIRAcetam  500 mg Intravenous Q12H  . LORazepam  2 mg Intravenous Q4H  . multivitamin with minerals  1 tablet Oral Daily  . pantoprazole (PROTONIX) IV  40 mg Intravenous QHS  . thiamine  100 mg Per Tube Daily  . ticagrelor  90 mg Oral BID  . vancomycin  1,000 mg Intravenous Q8H    Continuous Infusions: . amiodarone    . dextrose 60 mL/hr at 07/09/13 0700  . fentaNYL infusion INTRAVENOUS 250 mcg/hr (07/09/13 0800)  . midazolam (VERSED) infusion 5 mg/hr (07/09/13 1432)  . norepinephrine (LEVOPHED) Adult infusion 8 mcg/min (07/09/13 1200)    Past Medical History  Diagnosis Date  . Hypertension   . Asthma   . GERD (gastroesophageal reflux disease)   . Chronic pain   . Schizophrenia   . Alcohol abuse     No past surgical history on file.  Arthur Holms, RD, LDN Pager #: [phone] After-Hours Pager #: [phone]

## 2013-07-09 NOTE — Progress Notes (Addendum)
PULMONARY / CRITICAL CARE MEDICINE  Name: Chase Blackwell MRN: [id number] DOB: 1965/10/24 PCP Default, Provider, MD   ADMISSION DATE:  08/03/13  PRIMARY SERVICE: PCCM  CHIEF COMPLAINT:  VF Arrest -> Inferior STEMI (Cooling Protocol)  CARDIOLOGY: Harwani/Kadakia  BRIEF PATIENT DESCRIPTION:  Patient is a 48 year old male with PMH of asthma, HTN, EtOH abuse, chronic pain, schizoaffective d/o who presented to ER with VF arrest (down 15 mins) and inferior STEMI on August 04, 2022. Cooling protocol started and taken to cath lab for PCI with RCA stent placement.  7/5 Patient rewarmed, began having fine seizure-like activity. Treated with versed, neuro consulted.   LINES / TUBES: ETT Aug 04, 2022 >>> R IJ CVL 7/4 >>> L Rad art line 7/4 >>>  CULTURES: Urine 2022/08/04 >>> neg MRSA by PCR August 04, 2022 >>> neg 7/6 sputum>>>  ANTIBIOTICS: unasyn 7/6>>>  SIGNIFICANT EVENTS / STUDIES:  08/04/22 Intubated in field Aug 04, 2022 Taken to cath lab RCA occluded. Left system ok. S/p PCI August 04, 2022 Hypothermia protocol initiated 7/5 CT Head >>> no acute intracranial abnormality 7/5 EEG >>> activity marred by artifact but minimal  SUBJECTIVE: Per RN/notes, patient became tachy to 150s with sats in 80s overnight with seizure/tremor-like activity noted post-rewarming/stoppage of nimbex.  Breathing tx was given and pt was placed on 100% FiO2/given 4mg  versed.  Neuro consulted.  VITAL SIGNS: Filed Vitals:   07/09/13 0800 07/09/13 0812 07/09/13 0900 07/09/13 1000  BP: 98/44  94/52 102/56  Pulse: 90     Temp: 97.3 F (36.3 C)  97.9 F (36.6 C) 97.7 F (36.5 C)  TempSrc: Core (Comment)     Resp: 23  19 29   Height:      Weight:      SpO2: 100% 100% 98%    VENTILATOR SETTINGS: Vent Mode:  [-] PRVC FiO2 (%):  [30 %-80 %] 70 % Set Rate:  [25 bmp] 25 bmp Vt Set:  [600 mL] 600 mL PEEP:  [5 cmH20] 5 cmH20 Plateau Pressure:  [15 cmH20-20 cmH20] 15 cmH20  INTAKE / OUTPUT: I/O last 3 completed shifts: In: 5083.4 [I.V.:3183.4; IV  ZWCHENIDP:8242] Out: 2859 [Urine:2839; Emesis/NG output:20]   PHYSICAL EXAMINATION: General: Unkempt white male, sedated, intubated. Neuro:  Sedated.  Pupils equal, sluggish. No corneals, gag present but diminished. Does not respond to verbal or painful stimuli. Tremors throughout extremities. HEENT: Pupils sluggish.  Oral mucosa pink, dry.  No bleeding or discharge from nares. ETT in place. Cardiovascular:  RRR, no obvious murmur. Trace edema. Lungs: On vent, respirations slightly labored, irregular.  Breath sounds diminished, occasional expiratory wheeze noted. Abdomen:  Soft, non-tender, non-distended. Artic Sun pads in place post-rewarming. Musculoskeletal:  No acute deformities.  Cool to touch. Skin: Cool, dry, intact.  LABS: PULMONARY  Recent Labs Lab Aug 03, 2013 2158 07/07/13 0028 07/07/13 0232 07/07/13 0409 07/07/13 0610  PHART 7.250*  --   --   --   --   PCO2ART 41.9  --   --   --   --   PO2ART 89.0  --   --   --   --   HCO3 18.3*  --   --   --   --   TCO2 20 17 16 16 17   O2SAT 95.0  --   --   --   --     CBC  Recent Labs Lab 07/07/13 0020  07/07/13 0610 07/08/13 0345 07/09/13 0400  HGB 13.4  < > 13.6 12.6* 12.2*  HCT 39.1  < > 40.0 36.3* 37.0*  WBC 16.2*  --   --  5.7 4.8  PLT 281  --   --  224 196  < > = values in this interval not displayed.  COAGULATION  Recent Labs Lab 07/06/13 2145 07/07/13 0030 07/07/13 1652  INR 1.06 4.12* 1.12    CARDIAC    Recent Labs Lab 07/06/13 2145 07/06/13 2359 07/07/13 0330 07/07/13 0800 07/08/13 0800  TROPONINI 0.59* 13.38* >20.00* >20.00* 7.11*   No results found for this basename: PROBNP,  in the last 168 hours   CHEMISTRY  Recent Labs Lab 07/07/13 0300  07/08/13 0345 07/08/13 0800 07/08/13 1200 07/08/13 1600 07/09/13 0400  NA  --   < > 140 140 142 140 139  K  --   < > 3.7 3.9 4.1 4.5 4.8  CL  --   < > 107 108 109 107 106  CO2  --   < > 17* 17* 18* 20 20  GLUCOSE  --   < > 145* 137* 129* 181*  85  BUN  --   < > 6 5* 5* 6 7  CREATININE  --   < > 0.45* 0.46* 0.53 0.68 0.70  CALCIUM  --   < > 8.5 8.0* 8.3* 8.0* 8.4  MG 1.6  --   --   --   --   --   --   < > = values in this interval not displayed. Estimated Creatinine Clearance: 121.6 ml/min (by C-G formula based on Cr of 0.7).   LIVER  Recent Labs Lab 07/06/13 2145 07/07/13 0020 07/07/13 0030 07/07/13 1652  AST 60* 85*  --   --   ALT 30 32  --   --   ALKPHOS 63 57  --   --   BILITOT 0.2* 0.5  --   --   PROT 6.3 6.3  --   --   ALBUMIN 3.5 3.8  --   --   INR 1.06  --  4.12* 1.12     INFECTIOUS  Recent Labs Lab 07/07/13 0315  LATICACIDVEN 0.7     ENDOCRINE CBG (last 3)   Recent Labs  07/09/13 0400 07/09/13 0757 07/09/13 0826  GLUCAP 74 56* 86    IMAGING x48h  7/6 - Persistent perihilar/bibasilar airspace opacities, atx vs infiltrates increased rt hilum  ASSESSMENT / PLAN:  PULMONARY A:  Acute respiratory failure - secondary to acute MI Hx of asthma with airtrapping on vent.   Airway obstruction Apical PTX from CVL placement, tiny and not needing Rx P:   - Full vent support, abg now, may need rate increase further -peep increase to reduce O2 to goals - BDs  - Monitor CXR for rt hilum   CARDIOVASCULAR A:  Ventricular fibrillation arrest Acute inferior STEMI due to occluded RCA Hx HTN P:  - S/p PCI of RCA per cardiology  - Continue DAPT, ASA, Statin - Levo for pressure support, wean as BP tolerates, but to map 60 as now rewarmed - Consider hold Lopressor for BP - dc - Normothermic as of 7/6 x goal 72 hrs -tsh -cortisol -cvp assessment 3, bolus  INFECTIOUS A: R/o asp event pre ETT, s/o immunosuppressed from hypothermia protocol, poor dentition P: - sputum -BC -unasyn -vanc  GASTROINTESTINAL A:   Stress ulcer prophylaxis Hx GERD P:   - PPI - start TF -lft in am -may need to assess for [place] for liver dz  HEMATOLOGIC A: VTE PPx P: - SQ heparin -  Brilinta  ENDOCRINE A: Hypoglycemia - r/o underlying liver dz  P: - D10 65mL/hr, may need to adjust - Consider start TF, done  NEUROLOGIC A: S/p Cardiac Arrest - CPR 15 mins, anoxic brain injury Hx ETOH abuse Hx Schizophrenia Rigid - on haldol at home = at risk NMS R/o ETOH WD component P: -  EtOH WD protocol  - Neuro consulted, appreciate recs - EEG for seizure-like activity >>> no evidence of epileptiform activity - CT Head negative - Continue Keppra per neuro - Continue Cogentin, home med - Consider NM perfusion study per neuro for prognostication -TSH -ensure tox screen -consider datroline -treat myoclonus, would use benzo, versed drip, consider oral load ativan to dc this  Lowella Dell. Reese, PA-S  Global: consider dantoline, add ativan to dc versed drip if able, poor outcome likely, unasyn, vanc, BC, sputum  Ccm time 35 min  I have fully examined this patient and agree with above findings.    And edited infull   Lavon Paganini. Titus Mould, MD, [place] Pgr: [facility]

## 2013-07-09 NOTE — Procedures (Signed)
  Electroencephalogram report- LTM   Ordering Physician : Dr Doy Mince  EEG number: [phone]  Data acquisition: 10-20 electrode placement.  Additional T1, T2, and EKG electrodes; 26 channel digital referential acquisition reformatted to 18 channel/7 channel coronal bipolar     Spike detection: ON     Seizure detection: ON   Beginning time: 07/08/2013 Ending time: 07/09/2013   Day of study: day 1   This 24 hours of intensive EEG monitoring with simultaneous video monitoring was performed for this patient with HTN, ETOH abuse, GERD, schizoaffective disorder who was  found unresponsive, VF arrest, MI intubated and sedated, RN noted seizures-like"" activities.   Medications: sedated ? Med and dose are unavailable    There was no pushbutton activations events during this recording.  Automated spike detection program did not detect any spikes the.  Seizure detection program did not detect any seizures .   Waking background activities were marked by 8  cps posterior dominant symmetric synchronized alpha rhythm which tends to attenuate with eyes opening. Later in recording waking background activities reached 9-10 cps , alternating with generalized delta and theta slowing distributed broadly  At times left hemispheric delta slowing appeared to be more pronounced relative to the right hemisphere however findings appear to be relatively mild.  There was no epileptiform discharges clinical or subclinical seizures present.    Clinical interpretation: This 24 hours of intensive EEG monitoring with simultaneous monitoring did not record any clinical or subclinical seizures. No event of interest were recorded.   Background activities were marked by generalized background activities and perhaps with slightly greater degree of delta slowing  across left hemisphere with superimposed posterior dominant symmetric waking alpha rhythm  as discussed above. There were no clinical or subclinical seizures  present. Clinical correlation is advised.

## 2013-07-09 NOTE — Progress Notes (Addendum)
Subjective: Patient unchanged.  Remains on sedation.  When there is an attempt to discontinue the sedation the patient jerks violently.  No evidence of epileptiform activity on continuous EEG recording.    Objective: Current vital signs: BP 104/57  Pulse 91  Temp(Src) 98.1 F (36.7 C) (Core (Comment))  Resp 17  Ht 5\' 11"  (1.803 m)  Wt 83.4 kg (183 lb 13.8 oz)  BMI 25.66 kg/m2  SpO2 100% Vital signs in last 24 hours: Temp:  [94.1 F (34.5 C)-99.9 F (37.7 C)] 98.1 F (36.7 C) (07/06 0700) Pulse Rate:  [65-142] 91 (07/06 0700) Resp:  [17-26] 17 (07/06 0700) BP: (104)/(57) 104/57 mmHg (07/05 1918) SpO2:  [90 %-100 %] 100 % (07/06 0812) Arterial Line BP: (89-123)/(49-60) 89/49 mmHg (07/06 0700) FiO2 (%):  [30 %-80 %] 70 % (07/06 0812) Weight:  [83.4 kg (183 lb 13.8 oz)] 83.4 kg (183 lb 13.8 oz) (07/06 0500)  Intake/Output from previous day: 07/05 0701 - 07/06 0700 In: 2763.8 [I.V.:2563.8; IV Piggyback:200] Out: 2340 [Urine:2340] Intake/Output this shift:   Nutritional status: NPO  Neurologic Exam: Mental Status:  Patient does not respond to verbal stimuli. Does not respond to deep sternal rub. Does not follow commands. No verbalizations noted.  Cranial Nerves:  II: patient does not respond confrontation bilaterally, pupils right 3 mm, left 3 mm,and reactive bilaterally  III,IV,VI: doll's response absent bilaterally V,VII: corneal reflex absent bilaterally  VIII: patient does not respond to verbal stimuli  IX,X: gag reflex reduced, XI: trapezius strength unable to test bilaterally  XII: tongue strength unable to test  Motor:  Arms able to be bent actively approximately 45 degrees then become very stiff in the range. Legs extended and stiff-increased tone. Tremors noted throughout  Sensory:  Does not respond to noxious stimuli in any extremity.  Deep Tendon Reflexes:  2+ in the upper extremities and at the knees. Unable to be elicited at the ankles.  Plantars:  upgoing  bilaterally   Lab Results: Basic Metabolic Panel:  Recent Labs Lab 07/07/13 0300  07/08/13 0345 07/08/13 0800 07/08/13 1200 07/08/13 1600 07/09/13 0400  NA  --   < > 140 140 142 140 139  K  --   < > 3.7 3.9 4.1 4.5 4.8  CL  --   < > 107 108 109 107 106  CO2  --   < > 17* 17* 18* 20 20  GLUCOSE  --   < > 145* 137* 129* 181* 85  BUN  --   < > 6 5* 5* 6 7  CREATININE  --   < > 0.45* 0.46* 0.53 0.68 0.70  CALCIUM  --   < > 8.5 8.0* 8.3* 8.0* 8.4  MG 1.6  --   --   --   --   --   --   < > = values in this interval not displayed.  Liver Function Tests:  Recent Labs Lab 07/06/13 2145 07/07/13 0020  AST 60* 85*  ALT 30 32  ALKPHOS 63 57  BILITOT 0.2* 0.5  PROT 6.3 6.3  ALBUMIN 3.5 3.8   No results found for this basename: LIPASE, AMYLASE,  in the last 168 hours No results found for this basename: AMMONIA,  in the last 168 hours  CBC:  Recent Labs Lab 07/05/13 2046 07/06/13 2145 07/07/13 0020  07/07/13 0232 07/07/13 0409 07/07/13 0610 07/08/13 0345 07/09/13 0400  WBC 9.9 15.2* 16.2*  --   --   --   --  5.7 4.8  HGB 13.5 13.9 13.4  < > 15.0 14.6 13.6 12.6* 12.2*  HCT 38.0* 40.6 39.1  < > 44.0 43.0 40.0 36.3* 37.0*  MCV 92.5 95.5 94.4  --   --   --   --  93.3 97.1  PLT 280 288 281  --   --   --   --  224 196  < > = values in this interval not displayed.  Cardiac Enzymes:  Recent Labs Lab 07/06/13 2145 07/06/13 2359 07/07/13 0330 07/07/13 0800 07/08/13 0800  TROPONINI 0.59* 13.38* >20.00* >20.00* 7.11*    Lipid Panel: No results found for this basename: CHOL, TRIG, HDL, CHOLHDL, VLDL, LDLCALC,  in the last 168 hours  CBG:  Recent Labs Lab 07/08/13 2055 07/08/13 2121 07/08/13 2330 07/09/13 0400 07/09/13 0757  GLUCAP 58* 74 78 74 72*    Microbiology: Results for orders placed during the hospital encounter of 07/06/13  MRSA PCR SCREENING     Status: None   Collection Time    07/06/13 11:38 PM      Result Value Ref Range Status   MRSA by  PCR NEGATIVE  NEGATIVE Final   Comment:            The GeneXpert MRSA Assay (FDA     approved for NASAL specimens     only), is one component of a     comprehensive MRSA colonization     surveillance program. It is not     intended to diagnose MRSA     infection nor to guide or     monitor treatment for     MRSA infections.  URINE CULTURE     Status: None   Collection Time    07/06/13 11:52 PM      Result Value Ref Range Status   Specimen Description URINE, CATHETERIZED   Final   Special Requests NONE   Final   Culture  Setup Time     Final   Value: 07/07/2013 12:52     Performed at [facility]     Final   Value: NO GROWTH     Performed at [facility]   Culture     Final   Value: NO GROWTH     Performed at [facility]   Report Status 07/08/2013 FINAL   Final    Coagulation Studies:  Recent Labs  07/06/13 2145 07/07/13 0030 07/07/13 1652  LABPROT 13.8 39.9* 14.4  INR 1.06 4.12* 1.12    Imaging: Ct Head Wo Contrast  07/08/2013   CLINICAL DATA:  Cardiac arrest, see using.  EXAM: CT HEAD WITHOUT CONTRAST  TECHNIQUE: Contiguous axial images were obtained from the base of the skull through the vertex without intravenous contrast.  COMPARISON:  05/09/2011  FINDINGS: No acute intracranial abnormality. Specifically, no hemorrhage, hydrocephalus, mass lesion, acute infarction, or significant intracranial injury. No acute calvarial abnormality. Mucosal thickening in the paranasal sinuses. Mastoid air cells are clear. Orbital soft tissues unremarkable.  IMPRESSION: No acute intracranial abnormality.   Electronically Signed   By: Rolm Baptise M.D.   On: 07/08/2013 17:02   Dg Chest Port 1 View  07/09/2013   CLINICAL DATA:  Evaluate endotracheal tube.  EXAM: PORTABLE CHEST - 1 VIEW  COMPARISON:  07/08/2013.  FINDINGS: The support apparatus is stable. No complicating features. External pacer pad holes are unchanged. The cardiac silhouette,  mediastinal and hilar contours are within normal limits and unchanged. Persistent perihilar  and bibasilar airspace opacities could reflect atelectasis or infiltrates. No pleural effusion or pneumothorax.  IMPRESSION: Stable support apparatus.  Persistent perihilar and bibasilar airspace opacities, atelectasis versus infiltrates.   Electronically Signed   By: Kalman Jewels M.D.   On: 07/09/2013 07:36   Dg Chest Port 1 View  07/08/2013   CLINICAL DATA:  Ventilator dependent respiratory failure. Follow-up right apical pneumothorax. Acute MI.  EXAM: PORTABLE CHEST - 1 VIEW  COMPARISON:  Portable chest x-rays yesterday. Two-view chest x-ray 06/24/2013.  FINDINGS: Endotracheal tube tip remains in satisfactory position projecting approximately 6-7 cm above the carina. Right jugular central venous catheter tip projects over the lower SVC, unchanged. OG June courses below the diaphragm. External pacing pads overlie the left chest.  Cardiomediastinal silhouette unremarkable and unchanged. Interval resolution of the right apical pneumothorax. Mild atelectasis at the right lung base. Lungs otherwise clear. No localized airspace consolidation. No pleural effusions. No pneumothorax. Normal pulmonary vascularity.  IMPRESSION: 1. Support apparatus satisfactory. 2. Resolution of right apical pneumothorax. 3. Mild right basilar atelectasis. No acute cardiopulmonary disease otherwise.   Electronically Signed   By: Evangeline Dakin M.D.   On: 07/08/2013 09:12   Dg Chest Port 1 View  07/07/2013   CLINICAL DATA:  Followup tiny right apical pneumothorax.  EXAM: PORTABLE CHEST - 1 VIEW  COMPARISON:  Earlier today.  FINDINGS: The previously seen tiny right apical pneumothorax is unchanged. Stable right jugular catheter. Nasogastric tube extending into the stomach with its side hole just below the gastroesophageal junction. Clear lungs. Normal sized heart. Endotracheal tube in satisfactory position.  IMPRESSION: Stable tiny right  apical pneumothorax.   Electronically Signed   By: Enrique Sack M.D.   On: 07/07/2013 09:46    Medications:  I have reviewed the patient's current medications. Scheduled: . sodium chloride  2,000 mL Intravenous Once  . albuterol  3 mL Inhalation Q6H  . antiseptic oral rinse  15 mL Mouth Rinse QID  . aspirin  81 mg Oral Daily  . aspirin  300 mg Rectal Daily  . atorvastatin  80 mg Oral q1800  . benztropine  0.5 mg Per Tube Daily  . chlorhexidine  15 mL Mouth Rinse BID  . folic acid  1 mg Per Tube Daily  . heparin  5,000 Units Subcutaneous 3 times per day  . insulin aspart  2-6 Units Subcutaneous 6 times per day  . levETIRAcetam  500 mg Intravenous Q12H  . metoprolol  5 mg Intravenous 4 times per day  . multivitamin with minerals  1 tablet Oral Daily  . pantoprazole (PROTONIX) IV  40 mg Intravenous QHS  . thiamine  100 mg Per Tube Daily  . ticagrelor  90 mg Oral BID    Assessment/Plan: Patient unchanged.  Unable to get patient off sedation due to shaking.  Sedation is affecting the ability to get an accurate neurological examination.  EEG shows no seizure activity and very little background activity at all.  Unclear if the sedation may be attenuating the background as well but does not explain the absence of epileptiform activity if the clinical activity noted was truly seizure.  Head CT showed no evidence of edema.  Unclear at this point if presentation secondary to anoxic brain injury or if there may be some component of alcohol withdrawal contributing as well.    Recommendations: 1.  Would continue Keppra at current dose. 2.  D/C continuous EEG 3.  Patient warmed less than 24 hours ago.  At the time  prognostication to be made may want to consider a NM perfusion study    LOS: 3 days   Alexis Goodell, MD [facility] [phone] 07/09/2013  8:24 AM

## 2013-07-09 NOTE — ED Provider Notes (Signed)
Medical screening examination/treatment/procedure(s) were performed by non-physician practitioner and as supervising physician I was immediately available for consultation/collaboration.   EKG Interpretation None        Wandra Arthurs, MD 07/09/13 [phone]

## 2013-07-10 ENCOUNTER — Inpatient Hospital Stay (HOSPITAL_COMMUNITY): Payer: Medicare Other

## 2013-07-10 LAB — COMPREHENSIVE METABOLIC PANEL
ALK PHOS: 60 U/L (ref 39–117)
ALT: 47 U/L (ref 0–53)
AST: 110 U/L — ABNORMAL HIGH (ref 0–37)
Albumin: 2.2 g/dL — ABNORMAL LOW (ref 3.5–5.2)
Anion gap: 12 (ref 5–15)
BILIRUBIN TOTAL: 0.5 mg/dL (ref 0.3–1.2)
BUN: 9 mg/dL (ref 6–23)
CO2: 22 meq/L (ref 19–32)
Calcium: 8.3 mg/dL — ABNORMAL LOW (ref 8.4–10.5)
Chloride: 104 mEq/L (ref 96–112)
Creatinine, Ser: 0.63 mg/dL (ref 0.50–1.35)
GLUCOSE: 94 mg/dL (ref 70–99)
POTASSIUM: 4.7 meq/L (ref 3.7–5.3)
Sodium: 138 mEq/L (ref 137–147)
Total Protein: 5.4 g/dL — ABNORMAL LOW (ref 6.0–8.3)

## 2013-07-10 LAB — BLOOD GAS, ARTERIAL
ACID-BASE DEFICIT: 3.1 mmol/L — AB (ref 0.0–2.0)
BICARBONATE: 23.1 meq/L (ref 20.0–24.0)
Drawn by: 317771
FIO2: 0.4 %
MECHVT: 600 mL
O2 Saturation: 96.1 %
PCO2 ART: 54.8 mmHg — AB (ref 35.0–45.0)
PEEP: 5 cmH2O
PO2 ART: 74.3 mmHg — AB (ref 80.0–100.0)
Patient temperature: 98.6
RATE: 25 resp/min
TCO2: 24.8 mmol/L (ref 0–100)
pH, Arterial: 7.248 — ABNORMAL LOW (ref 7.350–7.450)

## 2013-07-10 LAB — GLUCOSE, CAPILLARY
GLUCOSE-CAPILLARY: 104 mg/dL — AB (ref 70–99)
GLUCOSE-CAPILLARY: 118 mg/dL — AB (ref 70–99)
GLUCOSE-CAPILLARY: 85 mg/dL (ref 70–99)
GLUCOSE-CAPILLARY: 94 mg/dL (ref 70–99)
Glucose-Capillary: 107 mg/dL — ABNORMAL HIGH (ref 70–99)
Glucose-Capillary: 108 mg/dL — ABNORMAL HIGH (ref 70–99)

## 2013-07-10 LAB — POCT I-STAT 3, ART BLOOD GAS (G3+)
ACID-BASE DEFICIT: 2 mmol/L (ref 0.0–2.0)
Bicarbonate: 23.5 mEq/L (ref 20.0–24.0)
O2 SAT: 87 %
TCO2: 25 mmol/L (ref 0–100)
pCO2 arterial: 43.1 mmHg (ref 35.0–45.0)
pH, Arterial: 7.344 — ABNORMAL LOW (ref 7.350–7.450)
pO2, Arterial: 57 mmHg — ABNORMAL LOW (ref 80.0–100.0)

## 2013-07-10 LAB — CBC WITH DIFFERENTIAL/PLATELET
BASOS PCT: 0 % (ref 0–1)
Basophils Absolute: 0 10*3/uL (ref 0.0–0.1)
Eosinophils Absolute: 0.1 10*3/uL (ref 0.0–0.7)
Eosinophils Relative: 2 % (ref 0–5)
HCT: 34.1 % — ABNORMAL LOW (ref 39.0–52.0)
Hemoglobin: 11.4 g/dL — ABNORMAL LOW (ref 13.0–17.0)
Lymphocytes Relative: 11 % — ABNORMAL LOW (ref 12–46)
Lymphs Abs: 0.5 10*3/uL — ABNORMAL LOW (ref 0.7–4.0)
MCH: 32.9 pg (ref 26.0–34.0)
MCHC: 33.4 g/dL (ref 30.0–36.0)
MCV: 98.6 fL (ref 78.0–100.0)
MONO ABS: 0.5 10*3/uL (ref 0.1–1.0)
Monocytes Relative: 11 % (ref 3–12)
Neutro Abs: 3.4 10*3/uL (ref 1.7–7.7)
Neutrophils Relative %: 76 % (ref 43–77)
PLATELETS: 180 10*3/uL (ref 150–400)
RBC: 3.46 MIL/uL — ABNORMAL LOW (ref 4.22–5.81)
RDW: 14.2 % (ref 11.5–15.5)
WBC Morphology: INCREASED
WBC: 4.5 10*3/uL (ref 4.0–10.5)

## 2013-07-10 LAB — T4, FREE: Free T4: 0.85 ng/dL (ref 0.80–1.80)

## 2013-07-10 LAB — CORTISOL: Cortisol, Plasma: 16.8 ug/dL

## 2013-07-10 LAB — T3: T3, Total: 50.4 ng/dl — ABNORMAL LOW (ref 80.0–204.0)

## 2013-07-10 MED ORDER — NOREPINEPHRINE BITARTRATE 1 MG/ML IV SOLN
0.5000 ug/min | INTRAVENOUS | Status: DC
  Administered 2013-07-10: 6 ug/min via INTRAVENOUS
  Administered 2013-07-12: 5 ug/min via INTRAVENOUS
  Filled 2013-07-10 (×2): qty 16

## 2013-07-10 MED ORDER — EMPTY CONTAINERS FLEXIBLE MISC
100.0000 mg | Freq: Once | Status: AC
  Administered 2013-07-10: 100 mg via INTRAVENOUS
  Filled 2013-07-10: qty 300

## 2013-07-10 NOTE — Progress Notes (Signed)
Subjective: Patient remains unresponsive and continues to be rigid. Remains on Keppra.  Objective: Current vital signs: BP 126/52  Pulse 93  Temp(Src) 97.3 F (36.3 C) (Core (Comment))  Resp 25  Ht 5\' 11"  (1.803 m)  Wt 86.3 kg (190 lb 4.1 oz)  BMI 26.55 kg/m2  SpO2 100% Vital signs in last 24 hours: Temp:  [97 F (36.1 C)-98.4 F (36.9 C)] 97.3 F (36.3 C) (07/07 0800) Pulse Rate:  [80-113] 93 (07/07 0826) Resp:  [14-29] 25 (07/07 0826) BP: (94-129)/(37-56) 126/52 mmHg (07/07 0800) SpO2:  [96 %-100 %] 100 % (07/07 0826) Arterial Line BP: (84-122)/(40-52) 122/49 mmHg (07/07 0800) FiO2 (%):  [40 %-70 %] 40 % (07/07 0826) Weight:  [86.3 kg (190 lb 4.1 oz)] 86.3 kg (190 lb 4.1 oz) (07/07 0400)  Intake/Output from previous day: 07/06 0701 - 07/07 0700 In: 5995.9 [I.V.:3345.9; NG/GT:800; IV Piggyback:1850] Out: 2215 [Urine:2215] Intake/Output this shift: Total I/O In: 198.3 [I.V.:53.3; NG/GT:45; IV Piggyback:100] Out: 175 [Urine:175] Nutritional status: NPO  Neurologic Exam: Mental Status:  Patient does not respond to verbal stimuli. Does not respond to deep sternal rub. Does not follow commands. No verbalizations noted.  Cranial Nerves:  II: patient does not respond confrontation bilaterally, pupils right 3 mm, left 3 mm,and unreactive bilaterally  III,IV,VI: doll's response absent bilaterally  V,VII: corneal reflex absent bilaterally  VIII: patient does not respond to verbal stimuli  IX,X: gag reflex reduced, XI: trapezius strength unable to test bilaterally  XII: tongue strength unable to test  Motor:  Arms able to be bent actively approximately 45 degrees then become very stiff in the range. Legs extended and stiff-increased tone. Tremors noted throughout  Sensory:  Does not respond to noxious stimuli in any extremity.  Deep Tendon Reflexes:  2+ in the upper extremities and at the knees. Unable to be elicited at the ankles.  Plantars:  upgoing bilaterally       Lab Results: Basic Metabolic Panel:  Recent Labs Lab 07/07/13 0300  07/08/13 0800 07/08/13 1200 07/08/13 1600 07/09/13 0400 07/10/13 0400  NA  --   < > 140 142 140 139 138  K  --   < > 3.9 4.1 4.5 4.8 4.7  CL  --   < > 108 109 107 106 104  CO2  --   < > 17* 18* 20 20 22   GLUCOSE  --   < > 137* 129* 181* 85 94  BUN  --   < > 5* 5* 6 7 9   CREATININE  --   < > 0.46* 0.53 0.68 0.70 0.63  CALCIUM  --   < > 8.0* 8.3* 8.0* 8.4 8.3*  MG 1.6  --   --   --   --   --   --   < > = values in this interval not displayed.  Liver Function Tests:  Recent Labs Lab 07/06/13 2145 07/07/13 0020 07/10/13 0400  AST 60* 85* 110*  ALT 30 32 47  ALKPHOS 63 57 60  BILITOT 0.2* 0.5 0.5  PROT 6.3 6.3 5.4*  ALBUMIN 3.5 3.8 2.2*   No results found for this basename: LIPASE, AMYLASE,  in the last 168 hours No results found for this basename: AMMONIA,  in the last 168 hours  CBC:  Recent Labs Lab 07/06/13 2145  07/07/13 0020  07/07/13 0409 07/07/13 0610 07/08/13 0345 07/09/13 0400 07/10/13 0400  WBC 15.2*  --  16.2*  --   --   --  5.7  4.8 4.5  NEUTROABS  --   --   --   --   --   --   --   --  3.4  HGB 13.9  < > 13.4  < > 14.6 13.6 12.6* 12.2* 11.4*  HCT 40.6  < > 39.1  < > 43.0 40.0 36.3* 37.0* 34.1*  MCV 95.5  --  94.4  --   --   --  93.3 97.1 98.6  PLT 288  --  281  --   --   --  224 196 180  < > = values in this interval not displayed.  Cardiac Enzymes:  Recent Labs Lab 07/06/13 2145 07/06/13 2359 07/07/13 0330 07/07/13 0800 07/08/13 0800  TROPONINI 0.59* 13.38* >20.00* >20.00* 7.11*    Lipid Panel: No results found for this basename: CHOL, TRIG, HDL, CHOLHDL, VLDL, LDLCALC,  in the last 168 hours  CBG:  Recent Labs Lab 07/09/13 0826 07/09/13 1154 07/09/13 1617 07/09/13 2015 07/10/13 0740  GLUCAP 86 73 88 78 107*    Microbiology: Results for orders placed during the hospital encounter of 07/06/13  MRSA PCR SCREENING     Status: None   Collection  Time    07/06/13 11:38 PM      Result Value Ref Range Status   MRSA by PCR NEGATIVE  NEGATIVE Final   Comment:            The GeneXpert MRSA Assay (FDA     approved for NASAL specimens     only), is one component of a     comprehensive MRSA colonization     surveillance program. It is not     intended to diagnose MRSA     infection nor to guide or     monitor treatment for     MRSA infections.  URINE CULTURE     Status: None   Collection Time    07/06/13 11:52 PM      Result Value Ref Range Status   Specimen Description URINE, CATHETERIZED   Final   Special Requests NONE   Final   Culture  Setup Time     Final   Value: 07/07/2013 12:52     Performed at [place]     Final   Value: NO GROWTH     Performed at [facility]   Culture     Final   Value: NO GROWTH     Performed at [facility]   Report Status 07/08/2013 FINAL   Final  CULTURE, BLOOD (ROUTINE X 2)     Status: None   Collection Time    07/09/13  1:10 PM      Result Value Ref Range Status   Specimen Description BLOOD LEFT HAND   Final   Special Requests BOTTLES DRAWN AEROBIC ONLY 8CC   Final   Culture  Setup Time     Final   Value: 07/09/2013 18:46     Performed at [facility]   Culture     Final   Value:        BLOOD CULTURE RECEIVED NO GROWTH TO DATE CULTURE WILL BE HELD FOR 5 DAYS BEFORE ISSUING A FINAL NEGATIVE REPORT     Performed at [facility]   Report Status PENDING   Incomplete  CULTURE, BLOOD (ROUTINE X 2)     Status: None   Collection Time    07/09/13  1:20 PM  Result Value Ref Range Status   Specimen Description BLOOD RIGHT HAND   Final   Special Requests BOTTLES DRAWN AEROBIC ONLY [facility]   Final   Culture  Setup Time     Final   Value: 07/09/2013 18:46     Performed at [facility]   Culture     Final   Value:        BLOOD CULTURE RECEIVED NO GROWTH TO DATE CULTURE WILL BE HELD FOR 5 DAYS BEFORE ISSUING A FINAL NEGATIVE  REPORT     Performed at [facility]   Report Status PENDING   Incomplete    Coagulation Studies:  Recent Labs  07/07/13 1652  LABPROT 14.4  INR 1.12    Imaging: Ct Head Wo Contrast  07/08/2013   CLINICAL DATA:  Cardiac arrest, see using.  EXAM: CT HEAD WITHOUT CONTRAST  TECHNIQUE: Contiguous axial images were obtained from the base of the skull through the vertex without intravenous contrast.  COMPARISON:  05/09/2011  FINDINGS: No acute intracranial abnormality. Specifically, no hemorrhage, hydrocephalus, mass lesion, acute infarction, or significant intracranial injury. No acute calvarial abnormality. Mucosal thickening in the paranasal sinuses. Mastoid air cells are clear. Orbital soft tissues unremarkable.  IMPRESSION: No acute intracranial abnormality.   Electronically Signed   By: Rolm Baptise M.D.   On: 07/08/2013 17:02   Dg Chest Port 1 View  07/10/2013   CLINICAL DATA:  Evaluate endotracheal tube position, intubation, hypertension, asthma  EXAM: PORTABLE CHEST - 1 VIEW  COMPARISON:  Portable exam 0645 hr compared to 07/09/2013  FINDINGS: Tip of endotracheal tube projects 4.9 cm above carina.  Nasogastric tube extends into stomach.  RIGHT jugular central venous catheter tip projects over mid SVC.  Numerous EKG leads external pacing leads project over chest.  Normal heart size, mediastinal contours.  Slight pulmonary vascular congestion with mild BILATERAL perihilar infiltrates.  Consolidation identified in RIGHT lower lobe slightly increased since previous exam.  No gross pleural effusion or pneumothorax.  IMPRESSION: Diffuse perihilar infiltrates question edema.  Slightly increased RIGHT lower lobe opacity could represent asymmetric edema, pneumonia or aspiration.   Electronically Signed   By: Lavonia Dana M.D.   On: 07/10/2013 08:25   Dg Chest Port 1 View  07/09/2013   CLINICAL DATA:  Evaluate endotracheal tube.  EXAM: PORTABLE CHEST - 1 VIEW  COMPARISON:  07/08/2013.  FINDINGS:  The support apparatus is stable. No complicating features. External pacer pad holes are unchanged. The cardiac silhouette, mediastinal and hilar contours are within normal limits and unchanged. Persistent perihilar and bibasilar airspace opacities could reflect atelectasis or infiltrates. No pleural effusion or pneumothorax.  IMPRESSION: Stable support apparatus.  Persistent perihilar and bibasilar airspace opacities, atelectasis versus infiltrates.   Electronically Signed   By: Kalman Jewels M.D.   On: 07/09/2013 07:36   Dg Chest Port 1 View  07/08/2013   CLINICAL DATA:  Ventilator dependent respiratory failure. Follow-up right apical pneumothorax. Acute MI.  EXAM: PORTABLE CHEST - 1 VIEW  COMPARISON:  Portable chest x-rays yesterday. Two-view chest x-ray 06/24/2013.  FINDINGS: Endotracheal tube tip remains in satisfactory position projecting approximately 6-7 cm above the carina. Right jugular central venous catheter tip projects over the lower SVC, unchanged. OG June courses below the diaphragm. External pacing pads overlie the left chest.  Cardiomediastinal silhouette unremarkable and unchanged. Interval resolution of the right apical pneumothorax. Mild atelectasis at the right lung base. Lungs otherwise clear. No localized airspace consolidation. No pleural effusions. No pneumothorax.  Normal pulmonary vascularity.  IMPRESSION: 1. Support apparatus satisfactory. 2. Resolution of right apical pneumothorax. 3. Mild right basilar atelectasis. No acute cardiopulmonary disease otherwise.   Electronically Signed   By: Evangeline Dakin M.D.   On: 07/08/2013 09:12    Medications:  I have reviewed the patient's current medications. Scheduled: . albuterol  3 mL Inhalation Q6H  . ampicillin-sulbactam (UNASYN) IV  3 g Intravenous Q6H  . antiseptic oral rinse  15 mL Mouth Rinse QID  . aspirin  81 mg Oral Daily  . atorvastatin  80 mg Oral q1800  . benztropine  0.5 mg Per Tube Daily  . chlorhexidine  15 mL  Mouth Rinse BID  . dantrolene (DANTRIUM) IV  100 mg Intravenous Once  . feeding supplement (PRO-STAT SUGAR FREE 64)  30 mL Per Tube TID  . folic acid  1 mg Per Tube Daily  . heparin  5,000 Units Subcutaneous 3 times per day  . insulin aspart  2-6 Units Subcutaneous 6 times per day  . levETIRAcetam  500 mg Intravenous Q12H  . LORazepam  2 mg Intravenous Q4H  . multivitamin with minerals  1 tablet Oral Daily  . pantoprazole (PROTONIX) IV  40 mg Intravenous QHS  . thiamine  100 mg Per Tube Daily  . ticagrelor  90 mg Oral BID  . vancomycin  1,000 mg Intravenous Q8H    Assessment/Plan: Patient unchanged. Unclear etiology of rigidity and tremors.  Can not rule out NMS.  Although presentation not necessarily classic, patient has had hypothermic protocol and this may be confusing the clinical picture.  EEG shows no evidence of continuous seizure activity.  The large amount of artifact does make it difficult to interpret.  Patient on Keppra.    Recommendations: 1.  Will evaluate response to Dantrolene and give a 1mg /kg dose today.  Based on response will determine need for continued dosage.   2.  Continue Keppra at current dose 3.  If patient responds to Dantrolene will repeat EEG when patient not stiff to obtain a recording with less artifact.    Case discussed with Dr. Titus Mould   LOS: 4 days   Alexis Goodell, MD [facility] [phone] 07/10/2013  8:39 AM

## 2013-07-10 NOTE — Progress Notes (Signed)
PULMONARY / CRITICAL CARE MEDICINE  Name: Chase Blackwell MRN: [id number] DOB: June 08, 1965 PCP Default, Provider, MD   ADMISSION DATE:  July 10, 2013  PRIMARY SERVICE: PCCM  CHIEF COMPLAINT:  VF Arrest -> Inferior STEMI (Cooling Protocol)  CARDIOLOGY: Harwani/Kadakia  BRIEF PATIENT DESCRIPTION:  Patient is a 48 year old male with PMH of asthma, HTN, EtOH abuse, chronic pain, schizoaffective d/o who presented to ER with VF arrest (down 15 mins) and inferior STEMI on 07-11-22. Cooling protocol started and taken to cath lab for PCI with RCA stent placement.  7/5 Patient rewarmed, began having fine seizure-like activity. Treated with versed, neuro consulted.   LINES / TUBES: ETT 07-11-22 >>> R IJ CVL 7/4 >>> L Rad art line 7/4 >>>  CULTURES: Urine July 11, 2022 >>> neg MRSA by PCR 2022-07-11 >>> neg --------------------------------- 7/6 Sputum (Expectorated) >>> 7/6 Sputum ( Non-Expectorated) >>> 7/6 BC x 2 >>>  ANTIBIOTICS: Unasyn 7/6 >>> Vanc 7/7 >>>  SIGNIFICANT EVENTS / STUDIES:  Jul 11, 2022 Intubated in field 07/11/22 Taken to cath lab RCA occluded. Left system ok. S/p PCI Jul 11, 2022 Hypothermia protocol initiated 7/5 CT Head >>> no acute intracranial abnormality 7/5 EEG >>> activity marred by artifact but minimal 7/6 Rewarmed, normothermic 7/6 Continuous EEG stopped, myoclonic jerks remain  SUBJECTIVE:  Per RN, patient did not tolerate scheduled ativan for myoclonic jerks/seizure-like activity and required bolus of versed to diminish tremulousness.  VITAL SIGNS: Filed Vitals:   07/10/13 0400 07/10/13 0403 07/10/13 0500 07/10/13 0600  BP:  116/49    Pulse:  108 107 103  Temp: 97.9 F (36.6 C)  97.5 F (36.4 C) 97.9 F (36.6 C)  TempSrc: Core (Comment)  Core (Comment) Core (Comment)  Resp: 27 27 25 27   Height:      Weight: 86.3 kg (190 lb 4.1 oz)     SpO2: 100% 100% 100% 100%   VENTILATOR SETTINGS: Vent Mode:  [-] PRVC FiO2 (%):  [40 %-80 %] 40 % Set Rate:  [25 bmp] 25 bmp Vt Set:  [600 mL] 600  mL PEEP:  [5 cmH20] 5 cmH20 Plateau Pressure:  [13 cmH20-22 cmH20] 18 cmH20  INTAKE / OUTPUT: I/O last 3 completed shifts: In: 7314.3 [I.V.:4809.3; NG/GT:755; IV Piggyback:1750] Out: 2780 [Urine:2780]   PHYSICAL EXAMINATION: General: Unkempt white male, sedated, intubated. Neuro:  Sedated.  Pupils equal, sluggish. No corneals, gag present but diminished. Does not respond to verbal or painful stimuli. Tremors throughout extremities. HEENT: Pupils sluggish.  Oral mucosa pink, dry.  No bleeding or discharge from nares. ETT in place. Cardiovascular:  RRR, no obvious murmur. Trace edema. Lungs: On vent, respirations slightly labored, irregular.  Breath sounds coarse. Abdomen:  Soft, non-tender, non-distended. Artic Sun pads in place post-rewarming. BS + Musculoskeletal:  No acute deformities.  Cool to touch. Skin: Cool, dry, intact.  LABS: PULMONARY  Recent Labs Lab 07/10/13 2158  July 10, 2013 2315  07/07/13 0232 07/07/13 0409 07/07/13 0610 07/09/13 1213 07/10/13 0438  PHART 7.250*  --  7.348*  --   --   --   --  7.238* 7.248*  PCO2ART 41.9  --  42.1  --   --   --   --  51.0* 54.8*  PO2ART 89.0  --  534.0*  --   --   --   --  151.0* 74.3*  HCO3 18.3*  --  23.1  --   --   --   --  21.3 23.1  TCO2 20  < > 24  < > 16 16 17  22.9 24.8  O2SAT 95.0  --  100.0  --   --   --   --  99.6 96.1  < > = values in this interval not displayed.  CBC  Recent Labs Lab 07/08/13 0345 07/09/13 0400 07/10/13 0400  HGB 12.6* 12.2* 11.4*  HCT 36.3* 37.0* 34.1*  WBC 5.7 4.8 4.5  PLT 224 196 180    COAGULATION  Recent Labs Lab 07/06/13 2145 07/07/13 0030 07/07/13 1652  INR 1.06 4.12* 1.12    CARDIAC    Recent Labs Lab 07/06/13 2145 07/06/13 2359 07/07/13 0330 07/07/13 0800 07/08/13 0800  TROPONINI 0.59* 13.38* >20.00* >20.00* 7.11*   No results found for this basename: PROBNP,  in the last 168 hours   CHEMISTRY  Recent Labs Lab 07/07/13 0300  07/08/13 0800 07/08/13 1200  07/08/13 1600 07/09/13 0400 07/10/13 0400  NA  --   < > 140 142 140 139 138  K  --   < > 3.9 4.1 4.5 4.8 4.7  CL  --   < > 108 109 107 106 104  CO2  --   < > 17* 18* 20 20 22   GLUCOSE  --   < > 137* 129* 181* 85 94  BUN  --   < > 5* 5* 6 7 9   CREATININE  --   < > 0.46* 0.53 0.68 0.70 0.63  CALCIUM  --   < > 8.0* 8.3* 8.0* 8.4 8.3*  MG 1.6  --   --   --   --   --   --   < > = values in this interval not displayed. Estimated Creatinine Clearance: 121.6 ml/min (by C-G formula based on Cr of 0.63).   LIVER  Recent Labs Lab 07/06/13 2145 07/07/13 0020 07/07/13 0030 07/07/13 1652 07/10/13 0400  AST 60* 85*  --   --  110*  ALT 30 32  --   --  47  ALKPHOS 63 57  --   --  60  BILITOT 0.2* 0.5  --   --  0.5  PROT 6.3 6.3  --   --  5.4*  ALBUMIN 3.5 3.8  --   --  2.2*  INR 1.06  --  4.12* 1.12  --      INFECTIOUS  Recent Labs Lab 07/07/13 0315  LATICACIDVEN 0.7     ENDOCRINE CBG (last 3)   Recent Labs  07/09/13 1154 07/09/13 1617 07/09/13 2015  GLUCAP 73 88 78    IMAGING x48h  7/6 - Persistent perihilar/bibasilar airspace opacities, atx vs infiltrates increased rt hilum  ASSESSMENT / PLAN:  PULMONARY A:  Acute respiratory failure - secondary to acute MI Hx of asthma with airtrapping on vent.   Airway obstruction Apical PTX from CVL placement, tiny and not needing Rx P:   - Full vent support, ph noted, avoid acidosis as can increase ICP - rate to 35, abg to follow - PEEP 5 and 40% successful - BDs  - Monitor CXR for rt hilum   CARDIOVASCULAR A:  Ventricular fibrillation arrest Acute inferior STEMI due to occluded RCA Hx HTN P:  - S/p PCI of RCA per cardiology  - Continue DAPT, ASA, Statin - Levo for pressure support, wean as BP tolerates, but to map 60 as now rewarmed - Normothermic as of 7/6 x goal 72 hrs - TSH >>> 0.261, assess t3 t4 - Cortisol >>> 16.8 - CVP assessment 9 7/7  RENAL A: Pos fluid balance (+3.5 L in 24h 7/6-7/7),  at risk  rhabdo P: - Consider diuresis once off pressors/CVP adequate -chem in am   INFECTIOUS A: R/o asp event pre ETT, s/o immunosuppressed from hypothermia protocol, poor dentition P: - Follow cultures - Unasyn - Vanc  GASTROINTESTINAL A:   Stress ulcer prophylaxis Hx GERD P:   - PPI - TF - LFTs >>> AST elevated 110, repeat  HEMATOLOGIC A: VTE PPx P: - SQ heparin - Brilinta  ENDOCRINE A: Hypoglycemia - r/o underlying liver dz, improved with TF R/o sick euthyroid vs hyper P: - D10 39mL/hr, reduce as glu values improve - TF started -t3 t4  NEUROLOGIC A: S/p Cardiac Arrest - CPR 15 mins, anoxic brain injury Hx ETOH abuse Hx Schizophrenia Rigid - on haldol at home = at risk NMS R/o ETOH WD component P: -  EtOH WD protocol  - Neuro consulted, appreciate recs - EEG for seizure-like activity >>> no evidence of epileptiform activity - CT Head negative - Continue Keppra per neuro - Continue Cogentin, home med - Consider NM perfusion study per neuro for prognostication after dantrolene affect and repeat neuro exam then - Tox screen + on admission for benzo, THC - Consider dantrolene , administered by neuro, agree -after dantrolene, assess neuro exam - Treat myoclonus - benzo, versed gtt -limited affect from ativan, increase  Lowella Dell. Ayesha Rumpf, PA-S   Want a family meeting today, brother wants a call, i will call I have fully examined this patient and agree with above findings.    And edited infull  Ccm time 35 min   Lavon Paganini. Titus Mould, MD, [place] Pgr: [facility]

## 2013-07-10 NOTE — Progress Notes (Signed)
Subjective:  Patient remains intubated sedated on low-dose pressors. Generalized tremors have improved  Objective:  Vital Signs in the last 24 hours: Temp:  [97 F (36.1 C)-98.4 F (36.9 C)] 97.7 F (36.5 C) (07/07 1100) Pulse Rate:  [80-113] 80 (07/07 1103) Resp:  [14-35] 31 (07/07 1103) BP: (76-129)/(37-54) 92/52 mmHg (07/07 1103) SpO2:  [94 %-100 %] 95 % (07/07 1103) Arterial Line BP: (100-122)/(40-52) 100/43 mmHg (07/07 1103) FiO2 (%):  [40 %-70 %] 40 % (07/07 0826) Weight:  [86.3 kg (190 lb 4.1 oz)] 86.3 kg (190 lb 4.1 oz) (07/07 0400)  Intake/Output from previous day: 07/06 0701 - 07/07 0700 In: 5995.9 [I.V.:3345.9; NG/GT:800; IV Piggyback:1850] Out: 2215 [Urine:2215] Intake/Output from this shift: Total I/O In: 923.8 [I.V.:493.8; NG/GT:330; IV Piggyback:100] Out: 650 [Urine:650]  Physical Exam: Neck: no adenopathy, no carotid bruit, no JVD and supple, symmetrical, trachea midline Lungs: Decreased breath sound at bases with bilateral rhonchi clear anteriorly Heart: regular rate and rhythm, S1, S2 normal and Soft systolic murmur noted Abdomen: Soft bowel sounds present Extremities: extremities normal, atraumatic, no cyanosis or edema  Lab Results:  Recent Labs  07/09/13 0400 07/10/13 0400  WBC 4.8 4.5  HGB 12.2* 11.4*  PLT 196 180    Recent Labs  07/09/13 0400 07/10/13 0400  NA 139 138  K 4.8 4.7  CL 106 104  CO2 20 22  GLUCOSE 85 94  BUN 7 9  CREATININE 0.70 0.63    Recent Labs  07/08/13 0800  TROPONINI 7.11*   Hepatic Function Panel  Recent Labs  07/10/13 0400  PROT 5.4*  ALBUMIN 2.2*  AST 110*  ALT 47  ALKPHOS 60  BILITOT 0.5   No results found for this basename: CHOL,  in the last 72 hours No results found for this basename: PROTIME,  in the last 72 hours  Imaging: Imaging results have been reviewed and Ct Head Wo Contrast  07/08/2013   CLINICAL DATA:  Cardiac arrest, see using.  EXAM: CT HEAD WITHOUT CONTRAST  TECHNIQUE:  Contiguous axial images were obtained from the base of the skull through the vertex without intravenous contrast.  COMPARISON:  05/09/2011  FINDINGS: No acute intracranial abnormality. Specifically, no hemorrhage, hydrocephalus, mass lesion, acute infarction, or significant intracranial injury. No acute calvarial abnormality. Mucosal thickening in the paranasal sinuses. Mastoid air cells are clear. Orbital soft tissues unremarkable.  IMPRESSION: No acute intracranial abnormality.   Electronically Signed   By: Rolm Baptise M.D.   On: 07/08/2013 17:02   Dg Chest Port 1 View  07/10/2013   CLINICAL DATA:  Evaluate endotracheal tube position, intubation, hypertension, asthma  EXAM: PORTABLE CHEST - 1 VIEW  COMPARISON:  Portable exam 0645 hr compared to 07/09/2013  FINDINGS: Tip of endotracheal tube projects 4.9 cm above carina.  Nasogastric tube extends into stomach.  RIGHT jugular central venous catheter tip projects over mid SVC.  Numerous EKG leads external pacing leads project over chest.  Normal heart size, mediastinal contours.  Slight pulmonary vascular congestion with mild BILATERAL perihilar infiltrates.  Consolidation identified in RIGHT lower lobe slightly increased since previous exam.  No gross pleural effusion or pneumothorax.  IMPRESSION: Diffuse perihilar infiltrates question edema.  Slightly increased RIGHT lower lobe opacity could represent asymmetric edema, pneumonia or aspiration.   Electronically Signed   By: Lavonia Dana M.D.   On: 07/10/2013 08:25   Dg Chest Port 1 View  07/09/2013   CLINICAL DATA:  Evaluate endotracheal tube.  EXAM: PORTABLE CHEST - 1 VIEW  COMPARISON:  07/08/2013.  FINDINGS: The support apparatus is stable. No complicating features. External pacer pad holes are unchanged. The cardiac silhouette, mediastinal and hilar contours are within normal limits and unchanged. Persistent perihilar and bibasilar airspace opacities could reflect atelectasis or infiltrates. No pleural  effusion or pneumothorax.  IMPRESSION: Stable support apparatus.  Persistent perihilar and bibasilar airspace opacities, atelectasis versus infiltrates.   Electronically Signed   By: Kalman Jewels M.D.   On: 07/09/2013 07:36    Cardiac Studies:  Assessment/Plan:  Acute inferior wall myocardial infarction status post PTCA stenting to 100% occluded RCA  Status post V. fib cardiac arrest  Acute respiratory failure secondary to above rule out aspiration  Hypertension  History of bronchial asthma  GERD  History of schizoaffective disorder  EtOH abuse  Chronic pain syndrome  Probable anoxic encephalopathy Plan We'll start beta blockers once off pressors. Continue present management per CCM/neurology. Check labs in a.m.    LOS: 4 days    Devante Capano N 07/10/2013, 11:58 AM

## 2013-07-11 ENCOUNTER — Inpatient Hospital Stay (HOSPITAL_COMMUNITY): Payer: Medicare Other

## 2013-07-11 DIAGNOSIS — R4182 Altered mental status, unspecified: Secondary | ICD-10-CM

## 2013-07-11 DIAGNOSIS — F1210 Cannabis abuse, uncomplicated: Secondary | ICD-10-CM

## 2013-07-11 LAB — BLOOD GAS, ARTERIAL
ACID-BASE EXCESS: 1.7 mmol/L (ref 0.0–2.0)
BICARBONATE: 26.8 meq/L — AB (ref 20.0–24.0)
Drawn by: 39898
FIO2: 0.5 %
MECHVT: 600 mL
O2 SAT: 99.6 %
PEEP/CPAP: 5 cmH2O
PO2 ART: 110 mmHg — AB (ref 80.0–100.0)
Patient temperature: 98.6
RATE: 35 resp/min
TCO2: 28.3 mmol/L (ref 0–100)
pCO2 arterial: 50.3 mmHg — ABNORMAL HIGH (ref 35.0–45.0)
pH, Arterial: 7.346 — ABNORMAL LOW (ref 7.350–7.450)

## 2013-07-11 LAB — CBC WITH DIFFERENTIAL/PLATELET
BASOS ABS: 0 10*3/uL (ref 0.0–0.1)
Basophils Relative: 0 % (ref 0–1)
Eosinophils Absolute: 0.1 10*3/uL (ref 0.0–0.7)
Eosinophils Relative: 3 % (ref 0–5)
HEMATOCRIT: 30.9 % — AB (ref 39.0–52.0)
Hemoglobin: 10.2 g/dL — ABNORMAL LOW (ref 13.0–17.0)
Lymphocytes Relative: 9 % — ABNORMAL LOW (ref 12–46)
Lymphs Abs: 0.4 10*3/uL — ABNORMAL LOW (ref 0.7–4.0)
MCH: 32.3 pg (ref 26.0–34.0)
MCHC: 33 g/dL (ref 30.0–36.0)
MCV: 97.8 fL (ref 78.0–100.0)
MONOS PCT: 18 % — AB (ref 3–12)
Monocytes Absolute: 0.8 10*3/uL (ref 0.1–1.0)
NEUTROS ABS: 3.3 10*3/uL (ref 1.7–7.7)
NEUTROS PCT: 70 % (ref 43–77)
Platelets: 182 10*3/uL (ref 150–400)
RBC: 3.16 MIL/uL — ABNORMAL LOW (ref 4.22–5.81)
RDW: 14.1 % (ref 11.5–15.5)
WBC MORPHOLOGY: INCREASED
WBC: 4.6 10*3/uL (ref 4.0–10.5)

## 2013-07-11 LAB — GLUCOSE, CAPILLARY
GLUCOSE-CAPILLARY: 74 mg/dL (ref 70–99)
GLUCOSE-CAPILLARY: 99 mg/dL (ref 70–99)
Glucose-Capillary: 116 mg/dL — ABNORMAL HIGH (ref 70–99)
Glucose-Capillary: 117 mg/dL — ABNORMAL HIGH (ref 70–99)
Glucose-Capillary: 135 mg/dL — ABNORMAL HIGH (ref 70–99)
Glucose-Capillary: 140 mg/dL — ABNORMAL HIGH (ref 70–99)
Glucose-Capillary: 155 mg/dL — ABNORMAL HIGH (ref 70–99)

## 2013-07-11 LAB — CULTURE, BLOOD (ROUTINE X 2)

## 2013-07-11 LAB — CULTURE, RESPIRATORY W GRAM STAIN

## 2013-07-11 LAB — COMPREHENSIVE METABOLIC PANEL
ALBUMIN: 1.9 g/dL — AB (ref 3.5–5.2)
ALT: 44 U/L (ref 0–53)
AST: 69 U/L — ABNORMAL HIGH (ref 0–37)
Alkaline Phosphatase: 229 U/L — ABNORMAL HIGH (ref 39–117)
Anion gap: 11 (ref 5–15)
BUN: 10 mg/dL (ref 6–23)
CO2: 25 mEq/L (ref 19–32)
Calcium: 8.8 mg/dL (ref 8.4–10.5)
Chloride: 102 mEq/L (ref 96–112)
Creatinine, Ser: 0.6 mg/dL (ref 0.50–1.35)
GFR calc non Af Amer: >90 mL/min (ref >90)
Glucose, Bld: 120 mg/dL — ABNORMAL HIGH (ref 70–99)
Potassium: 4.4 mEq/L (ref 3.7–5.3)
Sodium: 138 mEq/L (ref 137–147)
TOTAL PROTEIN: 5.2 g/dL — AB (ref 6.0–8.3)
Total Bilirubin: 0.5 mg/dL (ref 0.3–1.2)

## 2013-07-11 LAB — URINE DRUGS OF ABUSE SCREEN W ALC, ROUTINE (REF LAB)
AMPHETAMINE SCRN UR: NEGATIVE
Barbiturate Quant, Ur: NEGATIVE
Benzodiazepines.: POSITIVE — AB
COCAINE METABOLITES: NEGATIVE
CREATININE, U: 25 mg/dL
Ethyl Alcohol: <10 mg/dL (ref <10)
MARIJUANA METABOLITE: POSITIVE — AB
Methadone: NEGATIVE
Opiate Screen, Urine: NEGATIVE
PROPOXYPHENE: NEGATIVE
Phencyclidine (PCP): NEGATIVE

## 2013-07-11 LAB — TROPONIN I: Troponin I: 3.04 ng/mL (ref <0.30)

## 2013-07-11 MED ORDER — FUROSEMIDE 10 MG/ML IJ SOLN
20.0000 mg | Freq: Two times a day (BID) | INTRAMUSCULAR | Status: AC
  Administered 2013-07-11 – 2013-07-12 (×4): 20 mg via INTRAVENOUS
  Filled 2013-07-11 (×2): qty 2

## 2013-07-11 MED ORDER — FUROSEMIDE 10 MG/ML IJ SOLN
INTRAMUSCULAR | Status: AC
  Administered 2013-07-11: 20 mg via INTRAVENOUS
  Filled 2013-07-11: qty 4

## 2013-07-11 MED ORDER — HYDROCORTISONE NA SUCCINATE PF 100 MG IJ SOLR
50.0000 mg | Freq: Four times a day (QID) | INTRAMUSCULAR | Status: DC
  Administered 2013-07-11 – 2013-07-13 (×9): 50 mg via INTRAVENOUS
  Filled 2013-07-11 (×13): qty 1

## 2013-07-11 NOTE — Progress Notes (Signed)
Subjective: No acute changes overnight.   Exam: Filed Vitals:   07/11/13 0700  BP:   Pulse: 97  Temp:   Resp: 29   Gen: In bed, NAD MS: does not open eyes or follow commands [id number], eyes dysconjugate, corneals intact.  Motor: flexion in all four extermities to noxious stimuli.  Sensory:as above.  [id number] to positioning in the left upper extremities. ? Increased tone.    Impression: 48 yo M s/p vfib cardiac arrest on 07/03, s/p cooling protocol with re-warming on 07/05. EEG captured abnormal movements with no electrographic seizure activity. He had some increased tone previously, but this seems improved today.   His exam is confounded by multiple possible confounders to his presentation including previous neuroleptic use, alcohol abuse, schizophrenia. I suspect that he may have had some hypoxic injury, however this is difficult to ascertain with complete confidence. MRI brain may be helpful.   Recommendations: 1) Continue keppra 2) MRI brain.   Roland Rack, MD [facility] [phone]  If 7pm- 7am, please page neurology on call as listed in [place].

## 2013-07-11 NOTE — Progress Notes (Signed)
PULMONARY / CRITICAL CARE MEDICINE  Name: Chase Blackwell MRN: [id number] DOB: November 19, 1965 PCP Default, Provider, MD   ADMISSION DATE:  2013/08/04  PRIMARY SERVICE: PCCM  CHIEF COMPLAINT:  VF Arrest -> Inferior STEMI (Cooling Protocol)  CARDIOLOGY: Harwani/Kadakia  BRIEF PATIENT DESCRIPTION:  Patient is a 48 year old male with PMH of asthma, HTN, EtOH abuse, chronic pain, schizoaffective d/o who presented to ER with VF arrest (down 15 mins) and inferior STEMI on 2022/08/05. Cooling protocol started and taken to cath lab for PCI with RCA stent placement.  7/5 Patient rewarmed, began having fine seizure-like activity. Treated with versed, neuro consulted.   LINES / TUBES: ETT Aug 05, 2022 >>> R IJ CVL 7/4 >>> L Rad art line 7/4 >>>  CULTURES: Urine 08/05/2022 >>> neg MRSA by PCR 08-05-22 >>> neg --------------------------------- 7/6 Sputum (Expectorated) >>>Klieb pan sens 7/6 Sputum ( Non-Expectorated) >>> 7/6 BC x 2 >>>  ANTIBIOTICS: Unasyn 7/6 >>> Vanc 7/7 >>>7/8  SIGNIFICANT EVENTS / STUDIES:  2022/08/05 Intubated in field 2022-08-05 Taken to cath lab RCA occluded. Left system ok. S/p PCI August 05, 2022 Hypothermia protocol initiated 7/5 CT Head >>> no acute intracranial abnormality 7/5 EEG >>> activity marred by artifact but minimal 7/6 Rewarmed, normothermic 7/6 Continuous EEG stopped, myoclonic jerks remain 7/7- remains on sedation , tremors improved  SUBJECTIVE:  Controlled tremor  VITAL SIGNS: Filed Vitals:   07/11/13 0400 07/11/13 0500 07/11/13 0600 07/11/13 0700  BP:      Pulse: 101 95 96 97  Temp: 98.6 F (37 C) 98.4 F (36.9 C) 98.4 F (36.9 C)   TempSrc: Core (Comment) Core (Comment) Core (Comment) Core (Comment)  Resp: 31 31 31 29   Height:      Weight:  87.6 kg (193 lb 2 oz)    SpO2: 97% 98% 98% 98%   VENTILATOR SETTINGS: Vent Mode:  [-] PRVC FiO2 (%):  [40 %-60 %] 50 % Set Rate:  [25 bmp-35 bmp] 35 bmp Vt Set:  [600 mL] 600 mL PEEP:  [5 cmH20] 5 cmH20 Plateau Pressure:  [16 cmH20-26  cmH20] 25 cmH20  INTAKE / OUTPUT: I/O last 3 completed shifts: In: 7217.6 [I.V.:3222.6; NG/GT:2095; IV Piggyback:1900] Out: 4810 [Urine:4810]   PHYSICAL EXAMINATION: General: rass -4 Neuro:per sluggish, not awake, not respond to pain stimuli, rass -4 HEENT: ett PULM: ronchi rt greater left CV: s1 s2 RRR GI: soft, bw hypo , nr Extremities: edema increasing   LABS: PULMONARY  Recent Labs Lab 04-Aug-2013 2315  07/07/13 0610 07/09/13 1213 07/10/13 0438 07/10/13 1221 07/11/13 0355  PHART 7.348*  --   --  7.238* 7.248* 7.344* 7.346*  PCO2ART 42.1  --   --  51.0* 54.8* 43.1 50.3*  PO2ART 534.0*  --   --  151.0* 74.3* 57.0* 110.0*  HCO3 23.1  --   --  21.3 23.1 23.5 26.8*  TCO2 24  < > 17 22.9 24.8 25 28.3  O2SAT 100.0  --   --  99.6 96.1 87.0 99.6  < > = values in this interval not displayed.  CBC  Recent Labs Lab 07/09/13 0400 07/10/13 0400 07/11/13 0445  HGB 12.2* 11.4* 10.2*  HCT 37.0* 34.1* 30.9*  WBC 4.8 4.5 4.6  PLT 196 180 182    COAGULATION  Recent Labs Lab 08/04/13 2145 07/07/13 0030 07/07/13 1652  INR 1.06 4.12* 1.12    CARDIAC    Recent Labs Lab 08-04-13 2359 07/07/13 0330 07/07/13 0800 07/08/13 0800 07/11/13 0500  TROPONINI 13.38* >20.00* >20.00* 7.11* 3.04*   No results  found for this basename: PROBNP,  in the last 168 hours   CHEMISTRY  Recent Labs Lab 07/07/13 0300  07/08/13 1200 07/08/13 1600 07/09/13 0400 07/10/13 0400 07/11/13 0445  NA  --   < > 142 140 139 138 138  K  --   < > 4.1 4.5 4.8 4.7 4.4  CL  --   < > 109 107 106 104 102  CO2  --   < > 18* 20 20 22 25   GLUCOSE  --   < > 129* 181* 85 94 120*  BUN  --   < > 5* 6 7 9 10   CREATININE  --   < > 0.53 0.68 0.70 0.63 0.60  CALCIUM  --   < > 8.3* 8.0* 8.4 8.3* 8.8  MG 1.6  --   --   --   --   --   --   < > = values in this interval not displayed. Estimated Creatinine Clearance: 121.6 ml/min (by C-G formula based on Cr of 0.6).   LIVER  Recent Labs Lab  07/06/13 2145 07/07/13 0020 07/07/13 0030 07/07/13 1652 07/10/13 0400 07/11/13 0445  AST 60* 85*  --   --  110* 69*  ALT 30 32  --   --  47 44  ALKPHOS 63 57  --   --  60 229*  BILITOT 0.2* 0.5  --   --  0.5 0.5  PROT 6.3 6.3  --   --  5.4* 5.2*  ALBUMIN 3.5 3.8  --   --  2.2* 1.9*  INR 1.06  --  4.12* 1.12  --   --      INFECTIOUS  Recent Labs Lab 07/07/13 0315  LATICACIDVEN 0.7     ENDOCRINE CBG (last 3)   Recent Labs  07/10/13 1959 07/11/13 0013 07/11/13 0407  GLUCAP 85 74 135*    IMAGING x48h  7/6 - Persistent perihilar infiltrate remains unchanged, ett wnl  ASSESSMENT / PLAN:  PULMONARY A:  Acute respiratory failure - secondary to acute MI Hx of asthma with airtrapping on vent.   Airway obstruction Apical PTX from CVL placement, tiny and not needing Rx P:   - keep same MV, high MV required, NO SBT  - BDs  - Monitor CXR for rt hilum -consider lasix to even balance, pressors improved  CARDIOVASCULAR A:  Ventricular fibrillation arrest Acute inferior STEMI due to occluded RCA Hx HTN Rel AI P:  - S/p PCI of RCA per cardiology  - DAPT, ASA, Statin - Levo for pressure support, goal to dc today as on 5 mics this am  - Normothermic as of 7/6 x goal 72 hrs - TSH >>> 0.261, assess t3 low, T4 awaited, see endocrine - Cortisol >>> 16.8, add stress roids  RENAL A: Pos fluid balance (+3.5 L in 24h 7/6-7/7), at risk rhabdo P: - Consider diuresis  -chem in am   INFECTIOUS A: R/o asp event pre ETT, s/o immunosuppressed from hypothermia protocol, poor dentition P: - Unasyn maintain, consider 8 days - Vanc dc  GASTROINTESTINAL A:   Stress ulcer prophylaxis Hx GERD P:   - PPI - TF - LFTs >>> AST elevated 110, repeat improved  HEMATOLOGIC A: VTE PPx P: - SQ heparin - Brilinta  ENDOCRINE A: Hypoglycemia - r/o underlying liver dz, improved with TF - resolved R/o sick euthyroid (likely) P: -t4 awaited  NEUROLOGIC A: S/p Cardiac  Arrest - CPR 15 mins, anoxic brain injury Hx ETOH  abuse Hx Schizophrenia Rigid - on haldol at home = at risk NMS R/o ETOH WD component  EEG for seizure-like activity >>> no evidence of epileptiform activity P: - Neuro consulted, appreciate recs - - CT Head negative - Continue Keppra per neuro - Continue Cogentin, home med -holding ddatroline - Tox screen + on admission for benzo, THC -consider MRI brain then I will call brother with further prognostication - Treat clonus - benzo, versed gtt, interruption; consider depakote -WUA, per neuro  Lowella Dell. Ayesha Rumpf, PA-S   Want a family meeting today, brother wants a call, i will call I have fully examined this patient and agree with above findings.    And edited infull  Ccm time 35 min   Lavon Paganini. Titus Mould, MD, [place] Pgr: [facility]

## 2013-07-11 NOTE — Progress Notes (Signed)
ANTIBIOTIC CONSULT NOTE  Pharmacy Consult for  Unasyn Indication: aspiration pneumonia   Allergies  Allergen Reactions  . Hctz [Hydrochlorothiazide] Other (See Comments)    Dizzy spells  . Hydroxyzine Hives  . Sulfonamide Derivatives Hives  . Cetirizine & Related Other (See Comments)    unknown    Patient Measurements: Height: 5\' 11"  (180.3 cm) Weight: 193 lb 2 oz (87.6 kg) IBW/kg (Calculated) : 75.3   Vital Signs: Temp: 98.4 F (36.9 C) (07/08 0600) Temp src: Core (Comment) (07/08 0700) Pulse Rate: 97 (07/08 0700) Intake/Output from previous day: 07/07 0701 - 07/08 0700 In: 4405.6 [I.V.:1650.6; NG/GT:1555; IV Piggyback:1200] Out: 3520 [Urine:3520] Intake/Output from this shift:    Labs:  Recent Labs  07/09/13 0400 07/10/13 0400 07/11/13 0445  WBC 4.8 4.5 4.6  HGB 12.2* 11.4* 10.2*  PLT 196 180 182  CREATININE 0.70 0.63 0.60   Estimated Creatinine Clearance: 121.6 ml/min (by C-G formula based on Cr of 0.6).  Assessment: 59 YOM found down s/p VF arrest, inferior STEMI and hypothermia protocol on 7/3. Rewarmed on 7/5. Atelectasis versus infiltrates on chest xray- Vanc and Unasyn added for possible aspiration. TA grew out pan sens kleb pneumo and vancomycin stopped this morning. Blood cultures with 1/2 GPC in clusters. Renal function stable- CrCl still >170mL/min with SCr 0.6. WBC 4.6, Tmax 101.5.  Goal of Therapy:  appropriate Unasyn dose for renal function and infection  Plan:  1. Unasyn 3 grams IV q6h 2. Will follow renal function, culture data and clinical progression  Ladarion Munyon D. Lindora Alviar, PharmD, BCPS Clinical Pharmacist Pager: [phone] 07/11/2013 8:41 AM

## 2013-07-11 NOTE — Progress Notes (Signed)
Subjective:  Patient remains intubated sedated on low-dose pressors. Had nonpurposeful twitching earlier this morning being scheduled for MRI of the brain later today  Objective:  Vital Signs in the last 24 hours: Temp:  [97.5 F (36.4 C)-99 F (37.2 C)] 98.2 F (36.8 C) (07/08 1100) Pulse Rate:  [81-120] 95 (07/08 1139) Resp:  [15-35] 27 (07/08 1139) BP: (82-127)/(39-104) 96/60 mmHg (07/07 2000) SpO2:  [95 %-100 %] 97 % (07/08 1139) Arterial Line BP: (91-136)/(44-61) 128/50 mmHg (07/08 1000) FiO2 (%):  [50 %-60 %] 50 % (07/08 1139) Weight:  [87.6 kg (193 lb 2 oz)] 87.6 kg (193 lb 2 oz) (07/08 0500)  Intake/Output from previous day: 07/07 0701 - 07/08 0700 In: 4506.3 [I.V.:1696.3; NG/GT:1610; IV Piggyback:1200] Out: 3520 [Urine:3520] Intake/Output from this shift: Total I/O In: 546.6 [I.V.:121.6; NG/GT:325; IV Piggyback:100] Out: 700 [Urine:700]  Physical Exam: Neck: no adenopathy, no carotid bruit, no JVD and supple, symmetrical, trachea midline Lungs: Decreased breath sound at bases with bilateral rhonchi and faint rales Heart: regular rate and rhythm, S1, S2 normal and Soft systolic murmur noted Abdomen: soft, non-tender; bowel sounds normal; no masses,  no organomegaly Extremities: extremities normal, atraumatic, no cyanosis or edema  Lab Results:  Recent Labs  07/10/13 0400 07/11/13 0445  WBC 4.5 4.6  HGB 11.4* 10.2*  PLT 180 182    Recent Labs  07/10/13 0400 07/11/13 0445  NA 138 138  K 4.7 4.4  CL 104 102  CO2 22 25  GLUCOSE 94 120*  BUN 9 10  CREATININE 0.63 0.60    Recent Labs  07/11/13 0500  TROPONINI 3.04*   Hepatic Function Panel  Recent Labs  07/11/13 0445  PROT 5.2*  ALBUMIN 1.9*  AST 69*  ALT 44  ALKPHOS 229*  BILITOT 0.5   No results found for this basename: CHOL,  in the last 72 hours No results found for this basename: PROTIME,  in the last 72 hours  Imaging: Imaging results have been reviewed and Dg Chest Port 1  View  07/10/2013   CLINICAL DATA:  Evaluate endotracheal tube position, intubation, hypertension, asthma  EXAM: PORTABLE CHEST - 1 VIEW  COMPARISON:  Portable exam 0645 hr compared to 07/09/2013  FINDINGS: Tip of endotracheal tube projects 4.9 cm above carina.  Nasogastric tube extends into stomach.  RIGHT jugular central venous catheter tip projects over mid SVC.  Numerous EKG leads external pacing leads project over chest.  Normal heart size, mediastinal contours.  Slight pulmonary vascular congestion with mild BILATERAL perihilar infiltrates.  Consolidation identified in RIGHT lower lobe slightly increased since previous exam.  No gross pleural effusion or pneumothorax.  IMPRESSION: Diffuse perihilar infiltrates question edema.  Slightly increased RIGHT lower lobe opacity could represent asymmetric edema, pneumonia or aspiration.   Electronically Signed   By: Lavonia Dana M.D.   On: 07/10/2013 08:25    Cardiac Studies:  Assessment/Plan:  Acute inferior wall myocardial infarction status post PTCA stenting to 100% occluded RCA  Status post V. 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

## 2013-07-12 ENCOUNTER — Inpatient Hospital Stay (HOSPITAL_COMMUNITY): Payer: Medicare Other

## 2013-07-12 LAB — BLOOD GAS, ARTERIAL
ACID-BASE EXCESS: 5.8 mmol/L — AB (ref 0.0–2.0)
ACID-BASE EXCESS: 7.9 mmol/L — AB (ref 0.0–2.0)
Bicarbonate: 28.4 mEq/L — ABNORMAL HIGH (ref 20.0–24.0)
Bicarbonate: 32.4 mEq/L — ABNORMAL HIGH (ref 20.0–24.0)
Drawn by: 105521
FIO2: 0.4 %
FIO2: 0.4 %
O2 SAT: 94.9 %
O2 Saturation: 93.9 %
PATIENT TEMPERATURE: 98.6
PCO2 ART: 49.1 mmHg — AB (ref 35.0–45.0)
PEEP/CPAP: 5 cmH2O
PEEP: 5 cmH2O
PH ART: 7.434 (ref 7.350–7.450)
Patient temperature: 98.6
RATE: 35 resp/min
RATE: 18 resp/min
TCO2: 29.4 mmol/L (ref 0–100)
TCO2: 33.9 mmol/L (ref 0–100)
VT: 600 mL
VT: 600 mL
pCO2 arterial: 31.1 mmHg — ABNORMAL LOW (ref 35.0–45.0)
pH, Arterial: 7.568 — ABNORMAL HIGH (ref 7.350–7.450)
pO2, Arterial: 64.4 mmHg — ABNORMAL LOW (ref 80.0–100.0)
pO2, Arterial: 71.4 mmHg — ABNORMAL LOW (ref 80.0–100.0)

## 2013-07-12 LAB — CBC
HCT: 26.8 % — ABNORMAL LOW (ref 39.0–52.0)
Hemoglobin: 9.1 g/dL — ABNORMAL LOW (ref 13.0–17.0)
MCH: 31.9 pg (ref 26.0–34.0)
MCHC: 34 g/dL (ref 30.0–36.0)
MCV: 94 fL (ref 78.0–100.0)
PLATELETS: 216 10*3/uL (ref 150–400)
RBC: 2.85 MIL/uL — ABNORMAL LOW (ref 4.22–5.81)
RDW: 14 % (ref 11.5–15.5)
WBC: 7.6 10*3/uL (ref 4.0–10.5)

## 2013-07-12 LAB — BASIC METABOLIC PANEL WITH GFR
Anion gap: 12 (ref 5–15)
BUN: 16 mg/dL (ref 6–23)
CO2: 29 meq/L (ref 19–32)
Calcium: 8.4 mg/dL (ref 8.4–10.5)
Chloride: 104 meq/L (ref 96–112)
Creatinine, Ser: 0.83 mg/dL (ref 0.50–1.35)
GFR calc Af Amer: >90 mL/min
GFR calc non Af Amer: >90 mL/min
Glucose, Bld: 184 mg/dL — ABNORMAL HIGH (ref 70–99)
Potassium: 3.1 meq/L — ABNORMAL LOW (ref 3.7–5.3)
Sodium: 145 meq/L (ref 137–147)

## 2013-07-12 LAB — GLUCOSE, CAPILLARY
GLUCOSE-CAPILLARY: 143 mg/dL — AB (ref 70–99)
GLUCOSE-CAPILLARY: 123 mg/dL — AB (ref 70–99)
GLUCOSE-CAPILLARY: 163 mg/dL — AB (ref 70–99)
Glucose-Capillary: 166 mg/dL — ABNORMAL HIGH (ref 70–99)
Glucose-Capillary: 160 mg/dL — ABNORMAL HIGH (ref 70–99)

## 2013-07-12 MED ORDER — POTASSIUM CHLORIDE 20 MEQ/15ML (10%) PO LIQD
30.0000 meq | ORAL | Status: AC
  Administered 2013-07-12 (×2): 30 meq
  Filled 2013-07-12 (×2): qty 22.5

## 2013-07-12 MED ORDER — PRO-STAT SUGAR FREE PO LIQD
30.0000 mL | Freq: Two times a day (BID) | ORAL | Status: DC
  Administered 2013-07-12 – 2013-07-14 (×5): 30 mL
  Administered 2013-07-15: 11:00:00
  Administered 2013-07-15: 30 mL
  Administered 2013-07-16: 11:00:00
  Administered 2013-07-16 – 2013-07-17 (×2): 30 mL
  Filled 2013-07-12 (×11): qty 30

## 2013-07-12 MED ORDER — LORAZEPAM 2 MG/ML IJ SOLN
1.0000 mg | INTRAMUSCULAR | Status: DC
  Administered 2013-07-12 – 2013-07-13 (×8): 1 mg via INTRAVENOUS
  Filled 2013-07-12 (×8): qty 1

## 2013-07-12 MED ORDER — VITAMIN B-1 100 MG PO TABS
100.0000 mg | ORAL_TABLET | Freq: Every day | ORAL | Status: DC
  Administered 2013-07-16 – 2013-07-23 (×8): 100 mg
  Filled 2013-07-12 (×8): qty 1

## 2013-07-12 MED ORDER — ALTEPLASE 100 MG IV SOLR
2.0000 mg | Freq: Once | INTRAVENOUS | Status: DC
  Filled 2013-07-12: qty 2

## 2013-07-12 MED ORDER — ACETAMINOPHEN 160 MG/5ML PO SOLN
650.0000 mg | Freq: Four times a day (QID) | ORAL | Status: DC | PRN
  Administered 2013-07-12 – 2013-07-23 (×9): 650 mg
  Filled 2013-07-12 (×9): qty 20.3

## 2013-07-12 MED ORDER — FUROSEMIDE 10 MG/ML IJ SOLN
INTRAMUSCULAR | Status: AC
  Administered 2013-07-12: 20 mg via INTRAVENOUS
  Filled 2013-07-12: qty 4

## 2013-07-12 MED ORDER — PROPOFOL 10 MG/ML IV EMUL
5.0000 ug/kg/min | INTRAVENOUS | Status: DC
  Administered 2013-07-12: 20 ug/kg/min via INTRAVENOUS
  Filled 2013-07-12: qty 100

## 2013-07-12 MED ORDER — THIAMINE HCL 100 MG/ML IJ SOLN
500.0000 mg | Freq: Three times a day (TID) | INTRAMUSCULAR | Status: AC
  Administered 2013-07-12 – 2013-07-14 (×9): 500 mg via INTRAVENOUS
  Filled 2013-07-12 (×9): qty 5

## 2013-07-12 MED ORDER — PROPOFOL 10 MG/ML IV EMUL
INTRAVENOUS | Status: AC
  Filled 2013-07-12: qty 100

## 2013-07-12 MED ORDER — ACETAMINOPHEN 325 MG PO TABS
650.0000 mg | ORAL_TABLET | Freq: Four times a day (QID) | ORAL | Status: DC | PRN
  Administered 2013-07-12: 650 mg via ORAL
  Filled 2013-07-12: qty 2

## 2013-07-12 MED ORDER — "THROMBI-PAD 3""X3"" EX PADS"
1.0000 | MEDICATED_PAD | CUTANEOUS | Status: AC
  Administered 2013-07-12: 1 via TOPICAL
  Filled 2013-07-12: qty 1

## 2013-07-12 MED ORDER — VITAL 1.5 CAL PO LIQD
1000.0000 mL | ORAL | Status: DC
  Administered 2013-07-13 – 2013-07-19 (×7): 1000 mL
  Filled 2013-07-12 (×13): qty 1000

## 2013-07-12 NOTE — Progress Notes (Signed)
[facility] ADULT ICU REPLACEMENT PROTOCOL FOR AM LAB REPLACEMENT ONLY  The patient does apply for the [facility] Adult ICU Electrolyte Replacment Protocol based on the criteria listed below:   1. Is GFR >/= 40 ml/min? Yes.    Patient's GFR today is >90 2. Is urine output >/= 0.5 ml/kg/hr for the last 6 hours? Yes.   Patient's UOP is 1.3 ml/kg/hr 3. Is BUN < 60 mg/dL? Yes.    Patient's BUN today is 16 4. Abnormal electrolyte(s): K+3.1 5. Ordered repletion with: protocol 6. If a panic level lab has been reported, has the CCM MD in charge been notified? Yes.  .   Physician:  Otto Herb Hilliard 07/12/2013 5:51 AM

## 2013-07-12 NOTE — Progress Notes (Signed)
PULMONARY / CRITICAL CARE MEDICINE  Name: Chase Blackwell MRN: [id number] DOB: 12-07-1965 PCP Default, Provider, MD   ADMISSION DATE:  July 29, 2013  PRIMARY SERVICE: PCCM  CHIEF COMPLAINT:  VF Arrest -> Inferior STEMI (Cooling Protocol)  CARDIOLOGY: Harwani/Kadakia  BRIEF PATIENT DESCRIPTION:  Patient is a 48 year old male with PMH of asthma, HTN, EtOH abuse, chronic pain, schizoaffective d/o who presented to ER with VF arrest (down 15 mins) and inferior STEMI on 07-30-22. Cooling protocol started and taken to cath lab for PCI with RCA stent placement.  7/5 Patient rewarmed, began having fine seizure-like activity. Treated with versed, neuro consulted.   LINES / TUBES: ETT 2022/07/30 >>> R IJ CVL 7/4 >>> L Rad art line 7/4 >>>  CULTURES: Urine 2022/07/30 >>> neg MRSA by PCR 2022/07/30 >>> neg --------------------------------- 7/6 Sputum (Expectorated) >>>Klieb pan sens 7/6 Sputum ( Non-Expectorated) >>> 7/6 BC x 2 >>>  ANTIBIOTICS: Unasyn 7/6 >>> Vanc 7/7 >>>7/8  SIGNIFICANT EVENTS / STUDIES:  07-30-22 Intubated in field 30-Jul-2022 Taken to cath lab RCA occluded. Left system ok. S/p PCI 2022/07/30 Hypothermia protocol initiated 7/5 CT Head >>> no acute intracranial abnormality 7/5 EEG >>> activity marred by artifact but minimal 7/6 Rewarmed, normothermic 7/6 Continuous EEG stopped, myoclonic jerks remain 7/7- remains on sedation , tremors improved 7/8 MRI brain>>>neg acute, sinusitis 7/9- pause cardiac, reduced sedation, neg 1.6 liters  SUBJECTIVE:  Controlled tremor  VITAL SIGNS: Filed Vitals:   07/12/13 0700 07/12/13 0715 07/12/13 0750 07/12/13 0807  BP:   118/43   Pulse: 72 70    Temp: 100.2 F (37.9 C) 100.2 F (37.9 C)    TempSrc:      Resp: 22 35    Height:      Weight:      SpO2: 95% 95% 93% 94%   VENTILATOR SETTINGS: Vent Mode:  [-] PRVC FiO2 (%):  [40 %-50 %] 40 % Set Rate:  [35 bmp] 35 bmp Vt Set:  [600 mL] 600 mL PEEP:  [5 cmH20] 5 cmH20 Plateau Pressure:  [25 cmH20-33 cmH20] 33  cmH20  INTAKE / OUTPUT: I/O last 3 completed shifts: In: 5724.5 [I.V.:1689.5; NG/GT:2735; IV Piggyback:1300] Out: 0102 [Urine:7295]   PHYSICAL EXAMINATION: General: rass -4 Neuro:per sluggish, not awake, not respond to pain stimuli, rass -4 HEENT: ett PULM: ronchi improved CV: s1 s2 RRR GI: soft, bw hypo , nr Extremities: edema unchanged   LABS: PULMONARY  Recent Labs Lab 2013/07/29 2315  07/07/13 0610 07/09/13 1213 07/10/13 0438 07/10/13 1221 07/11/13 0355  PHART 7.348*  --   --  7.238* 7.248* 7.344* 7.346*  PCO2ART 42.1  --   --  51.0* 54.8* 43.1 50.3*  PO2ART 534.0*  --   --  151.0* 74.3* 57.0* 110.0*  HCO3 23.1  --   --  21.3 23.1 23.5 26.8*  TCO2 24  < > 17 22.9 24.8 25 28.3  O2SAT 100.0  --   --  99.6 96.1 87.0 99.6  < > = values in this interval not displayed.  CBC  Recent Labs Lab 07/10/13 0400 07/11/13 0445 07/12/13 0425  HGB 11.4* 10.2* 9.1*  HCT 34.1* 30.9* 26.8*  WBC 4.5 4.6 7.6  PLT 180 182 216    COAGULATION  Recent Labs Lab 2013/07/29 2145 07/07/13 0030 07/07/13 1652  INR 1.06 4.12* 1.12    CARDIAC    Recent Labs Lab 07/29/13 2359 07/07/13 0330 07/07/13 0800 07/08/13 0800 07/11/13 0500  TROPONINI 13.38* >20.00* >20.00* 7.11* 3.04*   No results found for  this basename: PROBNP,  in the last 168 hours   CHEMISTRY  Recent Labs Lab 07/07/13 0300  07/08/13 1600 07/09/13 0400 07/10/13 0400 07/11/13 0445 07/12/13 0425  NA  --   < > 140 139 138 138 145  K  --   < > 4.5 4.8 4.7 4.4 3.1*  CL  --   < > 107 106 104 102 104  CO2  --   < > 20 20 22 25 29   GLUCOSE  --   < > 181* 85 94 120* 184*  BUN  --   < > 6 7 9 10 16   CREATININE  --   < > 0.68 0.70 0.63 0.60 0.83  CALCIUM  --   < > 8.0* 8.4 8.3* 8.8 8.4  MG 1.6  --   --   --   --   --   --   < > = values in this interval not displayed. Estimated Creatinine Clearance: 117.2 ml/min (by C-G formula based on Cr of 0.83).   LIVER  Recent Labs Lab 07/06/13 2145  07/07/13 0020 07/07/13 0030 07/07/13 1652 07/10/13 0400 07/11/13 0445  AST 60* 85*  --   --  110* 69*  ALT 30 32  --   --  47 44  ALKPHOS 63 57  --   --  60 229*  BILITOT 0.2* 0.5  --   --  0.5 0.5  PROT 6.3 6.3  --   --  5.4* 5.2*  ALBUMIN 3.5 3.8  --   --  2.2* 1.9*  INR 1.06  --  4.12* 1.12  --   --      INFECTIOUS  Recent Labs Lab 07/07/13 0315  LATICACIDVEN 0.7     ENDOCRINE CBG (last 3)   Recent Labs  07/11/13 2346 07/12/13 0404 07/12/13 0748  GLUCAP 117* 166* 143*    IMAGING x48h  7/6 - Persistent perihilar improved, ett wnl  ASSESSMENT / PLAN:  PULMONARY A:  Acute respiratory failure - secondary to acute MI Hx of asthma Airway obstruction Apical PTX from CVL placement, tiny and not needing Rx P:   - keep same MV, had a long pause, MV up, no sbt -repeat bag in hopes to reduce rate -trach would likely be medically ineffective - BDs  -did well with lasix, maintain  CARDIOVASCULAR A:  Ventricular fibrillation arrest Acute inferior STEMI due to occluded RCA Hx HTN Rel AI cardiaxc pause P:  - S/p PCI of RCA per cardiology  - DAPT, ASA, Statin - Levo for pressure support to map goal 60 - Normothermic as of 7/6 x goal 72 hrs - stress steroids to wean once off pressors 24 hrs -reduce sedation, feel pause likely neuro injury vs sedation, see neuro  RENAL A: Pos fluid balance (+3.5 L in 24h 7/6-7/7), at risk rhabdo hypokalemia P: - Consider diuresis  -chem in am  -k supp  INFECTIOUS A: R/o asp event pre ETT, s/o immunosuppressed from hypothermia protocol, poor dentition P: - Unasyn maintain, consider 8 days, stop date in place - Vanc dc  GASTROINTESTINAL A:   Stress ulcer prophylaxis Hx GERD P:   - PPI - TF  HEMATOLOGIC A: VTE PPx P: - SQ heparin - Brilinta  ENDOCRINE A: Hypoglycemia - r/o underlying liver dz, improved with TF - resolved R/o sick euthyroid (likely) P: -treat underlying illness -cbg    NEUROLOGIC A: S/p Cardiac Arrest - CPR 15 mins, anoxic brain injury Hx ETOH abuse Hx Schizophrenia  Rigid - on haldol at home = at risk NMS R/o ETOH WD component  EEG for seizure-like activity >>> no evidence of epileptiform activity P: - ativan reduction -MRI reviewed, will d/w neuro - Continue Keppra - dc Cogentin -given his ETOH, psychiatric illness, and now likely anoxic injury,m I feel his fxnal recovery is concerning    Calling brother back now  Ccm time 30 min   Lavon Paganini. Titus Mould, MD, [place] Pgr: [facility]

## 2013-07-12 NOTE — Progress Notes (Signed)
Subjective:  Patient remains unresponsive on vent. Patient had one pause more than 8 seconds and spontaneously went back into sinus rhythm no further pauses noted not sure whether lead was off.  Objective:  Vital Signs in the last 24 hours: Temp:  [98.1 F (36.7 C)-101.1 F (38.4 C)] 99.5 F (37.5 C) (07/09 1000) Pulse Rate:  [70-97] 72 (07/09 1000) Resp:  [16-35] 31 (07/09 1000) BP: (95-146)/(43-65) 139/55 mmHg (07/09 1000) SpO2:  [91 %-100 %] 91 % (07/09 1000) Arterial Line BP: (85-140)/(44-62) 140/58 mmHg (07/09 1000) FiO2 (%):  [40 %-50 %] 40 % (07/09 0807) Weight:  [86.2 kg (190 lb 0.6 oz)] 86.2 kg (190 lb 0.6 oz) (07/09 0322)  Intake/Output from previous day: 07/08 0701 - 07/09 0700 In: 3591.9 [I.V.:1096.9; UU/VO:5366; IV Piggyback:600] Out: 5275 [Urine:5275] Intake/Output from this shift: Total I/O In: 539.6 [I.V.:54.6; NG/GT:335; IV Piggyback:150] Out: 175 [Urine:175]  Physical Exam: Neck: no adenopathy, no carotid bruit, no JVD and supple, symmetrical, trachea midline Lungs: Decreased breath sound at bases with bilateral rhonchi Heart: regular rate and rhythm, S1, S2 normal and Soft systolic murmur noted Abdomen: Soft bowel sounds present Extremities: extremities normal, atraumatic, no cyanosis or edema  Lab Results:  Recent Labs  07/11/13 0445 07/12/13 0425  WBC 4.6 7.6  HGB 10.2* 9.1*  PLT 182 216    Recent Labs  07/11/13 0445 07/12/13 0425  NA 138 145  K 4.4 3.1*  CL 102 104  CO2 25 29  GLUCOSE 120* 184*  BUN 10 16  CREATININE 0.60 0.83    Recent Labs  07/11/13 0500  TROPONINI 3.04*   Hepatic Function Panel  Recent Labs  07/11/13 0445  PROT 5.2*  ALBUMIN 1.9*  AST 69*  ALT 44  ALKPHOS 229*  BILITOT 0.5   No results found for this basename: CHOL,  in the last 72 hours No results found for this basename: PROTIME,  in the last 72 hours  Imaging: Imaging results have been reviewed and Mr Brain Wo Contrast  07/11/2013   CLINICAL  DATA:  Found down. Status post cardiac resuscitation. Extended CPR. Altered mental status.  EXAM: MRI HEAD WITHOUT CONTRAST  TECHNIQUE: Multiplanar, multiecho pulse sequences of the brain and surrounding structures were obtained without intravenous contrast.  COMPARISON:  CT head without contrast 07/08/2013  FINDINGS: No acute infarct, hemorrhage, or mass lesion is present. The ventricles are of normal size. No significant extraaxial fluid collection is present.  Flow is present in the major intracranial arteries. The right vertebral artery is hypoplastic. No significant white matter disease is present. The globes and orbits are intact. Circumferential mucosal thickening is present in the left maxillary sinus. Extensive mucosal thickening is present throughout the ethmoid air cells and frontal sinuses. The sphenoid sinuses are partially opacified bilaterally. Fluid in the nasopharynx is likely related to intubation. Bilateral mastoid effusions are noted.  IMPRESSION: 1. Normal MRI appearance of the brain. 2. Extensive sinus and mastoid disease is likely related to intubation.   Electronically Signed   By: Lawrence Santiago M.D.   On: 07/11/2013 18:51   Dg Chest Port 1 View  07/12/2013   CLINICAL DATA:  Intubation.  EXAM: PORTABLE CHEST - 1 VIEW  COMPARISON:  Chest x-ray 07/11/2013.  FINDINGS: Endotracheal tube, NG tube, right IJ line in stable position. Persistent prominent bibasilar pulmonary infiltrates consistent pneumonia noted. There has been partial clearing right lower lobe infiltrate. No pleural effusion or pneumothorax. No acute osseus abnormality .  IMPRESSION: 1. Line and tube  positions stable. 2. Persistent dense bibasilar pulmonary infiltrates consistent with pneumonia. There has been partial clearing of right lower lobe infiltrate .   Electronically Signed   By: Marcello Moores  Register   On: 07/12/2013 08:07   Dg Chest Port 1 View  07/11/2013   CLINICAL DATA:  Check endotracheal tube  EXAM: PORTABLE CHEST -  1 VIEW  COMPARISON:  07/10/2013  FINDINGS: Endotracheal tube in good position. NG tube enters the stomach. Right jugular catheter tip in the lower SVC.  Negative for pneumothorax.  Improvement in bilateral edema.  Bibasilar atelectasis/infiltrate slightly increased. Possible pneumonia.  IMPRESSION: Endotracheal tube in good position.  Increase in bibasilar airspace disease which may represent atelectasis or pneumonia.   Electronically Signed   By: Franchot Gallo M.D.   On: 07/11/2013 08:05    Cardiac Studies:  Assessment/Plan:  Acute inferior wall myocardial infarction status post PTCA stenting to 100% occluded RCA  Status post V. 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

## 2013-07-12 NOTE — Procedures (Signed)
Central Venous Catheter Insertion Procedure Note Chase Blackwell [id number] 1965-03-16  Procedure: Insertion of Central Venous Catheter Indications: Assessment of intravascular volume, Drug and/or fluid administration and Frequent blood sampling  Procedure Details Consent: Unable to obtain consent because of emergent medical necessity. Time Out: Verified patient identification, verified procedure, site/side was marked, verified correct patient position, special equipment/implants available, medications/allergies/relevent history reviewed, required imaging and test results available.  Performed  Maximum sterile technique was used including antiseptics, cap, gloves, gown, hand hygiene, mask and sheet. Skin prep: Chlorhexidine; local anesthetic administered A antimicrobial bonded/coated triple lumen catheter was placed in the left internal jugular vein using the Seldinger technique.  Evaluation Blood flow good Complications: No apparent complications Patient did tolerate procedure well. Chest X-ray ordered to verify placement.  CXR: pending.  U/S used in placement.  YACOUB,WESAM 07/12/2013, 7:14 PM

## 2013-07-12 NOTE — Progress Notes (Signed)
eLink Physician-Brief Progress Note Patient Name: Chase Blackwell DOB: [birth year] MRN: [id number]  Date of Service  07/12/2013   HPI/Events of Note  Bleeding from left ij site   eICU Interventions  Dc sq heparin Manual pressure by RN  Thrombi pad PCCM MD to bedside   Intervention Category Major Interventions: Hemorrhage - evaluation and management  Marlon Suleiman 07/12/2013, 8:25 PM

## 2013-07-12 NOTE — Progress Notes (Signed)
NUTRITION FOLLOW-UP  DOCUMENTATION CODES Per approved criteria  -Not Applicable   INTERVENTION: To better meet nutritional needs, increase Vital 1.5 formula to goal rate of 65 ml/hr and decrease Prostat liquid protein to 30 ml BID via tube to provide 2540 kcals, 136 gm protein, 1191 ml of free water. Discontinue MVI. RD to follow for nutrition care plan  NUTRITION DIAGNOSIS: Inadequate oral intake related to inability to eat as evidenced by NPO, ongoing.  Goal: Pt to meet >/= 90% of their estimated nutrition needs, currently met  Monitor:  TF regimen & tolerance, respiratory status, weight, labs, I/O's  ASSESSMENT: Pt with PMHx of HTN, asthma, chronic pain, schizophrenia, and alcohol abuse. Presented to ER 7/2 with hip pain after a fall and released. Pt brought in by EMS after successful resuscitation of out of cardiac arrest, was intubated. Initial ECG showed inferior wall myocardial infarction, so code STEMI was activated. He has also been started on cooling protocol.   Patient s/p cardiac cath 7/3.  Patient is currently intubated on ventilator support -- OGT in place MV: 21 L/min Temp (24hrs), Avg:99.9 F (37.7 C), Min:98.1 F (36.7 C), Max:101.1 F (38.4 C)  Neurology continues to follow patient - pt noted to continue to be densely encephalopathic.  Receiving Vital 1.5 at 55 ml/hr with 30 ml Prostat liquid protein via tube. Tolerating well.  Potassium low at 3.1  Height: Ht Readings from Last 1 Encounters:  07/06/13 $RemoveB'5\' 11"'NacrKzaT$  (1.803 m)    Weight: Wt Readings from Last 1 Encounters:  07/12/13 190 lb 0.6 oz (86.2 kg)  Admit wt 184 lb   BMI:  Body mass index is 26.52 kg/(m^2). Overweight  Re-estimated needs: Kcal: 2515 Protein: 125-135 gm Fluid: per MD  Skin: intact   Diet Order: NPO   Intake/Output Summary (Last 24 hours) at 07/12/13 1006 Last data filed at 07/12/13 0900  Gross per 24 hour  Intake 3317.1 ml  Output   4620 ml  Net -1302.9 ml     Labs:   Recent Labs Lab 07/07/13 0300  07/10/13 0400 07/11/13 0445 07/12/13 0425  NA  --   < > 138 138 145  K  --   < > 4.7 4.4 3.1*  CL  --   < > 104 102 104  CO2  --   < > $R'22 25 29  'Qc$ BUN  --   < > $R'9 10 16  'ww$ CREATININE  --   < > 0.63 0.60 0.83  CALCIUM  --   < > 8.3* 8.8 8.4  MG 1.6  --   --   --   --   GLUCOSE  --   < > 94 120* 184*  < > = values in this interval not displayed.  CBG (last 3)   Recent Labs  07/11/13 2346 07/12/13 0404 07/12/13 0748  GLUCAP 117* 166* 143*   Last BM:  Scheduled Meds: . albuterol  3 mL Inhalation Q6H  . ampicillin-sulbactam (UNASYN) IV  3 g Intravenous Q6H  . antiseptic oral rinse  15 mL Mouth Rinse QID  . aspirin  81 mg Oral Daily  . atorvastatin  80 mg Oral q1800  . chlorhexidine  15 mL Mouth Rinse BID  . feeding supplement (PRO-STAT SUGAR FREE 64)  30 mL Per Tube TID  . folic acid  1 mg Per Tube Daily  . furosemide  20 mg Intravenous Q12H  . heparin  5,000 Units Subcutaneous 3 times per day  . hydrocortisone sodium succinate  50 mg Intravenous 4 times per day  . insulin aspart  2-6 Units Subcutaneous 6 times per day  . levETIRAcetam  500 mg Intravenous Q12H  . LORazepam  1 mg Intravenous Q4H  . multivitamin with minerals  1 tablet Oral Daily  . pantoprazole (PROTONIX) IV  40 mg Intravenous QHS  . [START ON 07/16/2013] thiamine  100 mg Per Tube Daily  . thiamine IV  500 mg Intravenous TID  . ticagrelor  90 mg Oral BID    Continuous Infusions: . sodium chloride 10 mL/hr at 07/11/13 2000  . feeding supplement (VITAL 1.5 CAL) 1,000 mL (07/12/13 0901)  . fentaNYL infusion INTRAVENOUS Stopped (07/12/13 0740)  . norepinephrine (LEVOPHED) Adult infusion 4 mcg/min (07/12/13 0730)    Inda Coke MS, RD, LDN Inpatient Registered Dietitian Pager: [phone] After-hours pager: [phone]

## 2013-07-12 NOTE — Progress Notes (Signed)
Subjective: No changes.   Exam: Filed Vitals:   07/12/13 0750  BP: 118/43  Pulse:   Temp:   Resp:    Gen: In bed, NAD MS: Does not open eyes or follow commands.  XH:FSFSE, eyes dysconjujate, corneals intact Motor: flexion to noxious stimuli.  Sensory:as above.  DTR:3+ on left, 2+ on right. No sustained clonus today.   MRI - no acute changes, I question an area of mild coritcal ribboning in the left parietal region, but this certainly could be artifact.   Impression: 48 yo M with persistent mental status changes following cardiac arrest. MRI does not show changes, but it can be negative even in the setting of hypoxic injury. He continues to be densely encephalopathic, but is getting ativan 2mg  IV Q4H.   Recommendations: 1) Would decrease ativan to 1mg  q4h with plans to further downtitrate.  2) Continue keppra for now, but no clear evidence of seizure.  3) will continue to follow.   Roland Rack, MD [facility] [phone]  If 7pm- 7am, please page neurology on call as listed in [place].

## 2013-07-12 NOTE — Progress Notes (Signed)
Arrived to find blood in line to hub in distal port. Attempted to place TPA in occluded distal port, unable to infuse, dead in cap placed on line and label placed over cap. Nurse advised to have line replaced due to increased risk for CLABSI. Chase Blackwell

## 2013-07-13 ENCOUNTER — Inpatient Hospital Stay (HOSPITAL_COMMUNITY): Payer: Medicare Other

## 2013-07-13 LAB — COMPREHENSIVE METABOLIC PANEL
ALBUMIN: 1.8 g/dL — AB (ref 3.5–5.2)
ALT: 46 U/L (ref 0–53)
AST: 52 U/L — ABNORMAL HIGH (ref 0–37)
Alkaline Phosphatase: 171 U/L — ABNORMAL HIGH (ref 39–117)
Anion gap: 11 (ref 5–15)
BILIRUBIN TOTAL: 0.3 mg/dL (ref 0.3–1.2)
BUN: 20 mg/dL (ref 6–23)
CHLORIDE: 111 meq/L (ref 96–112)
CO2: 30 mEq/L (ref 19–32)
CREATININE: 0.81 mg/dL (ref 0.50–1.35)
Calcium: 7.5 mg/dL — ABNORMAL LOW (ref 8.4–10.5)
GFR calc Af Amer: >90 mL/min (ref >90)
GFR calc non Af Amer: >90 mL/min (ref >90)
Glucose, Bld: 155 mg/dL — ABNORMAL HIGH (ref 70–99)
Potassium: 3 mEq/L — ABNORMAL LOW (ref 3.7–5.3)
Sodium: 152 mEq/L — ABNORMAL HIGH (ref 137–147)
Total Protein: 4.9 g/dL — ABNORMAL LOW (ref 6.0–8.3)

## 2013-07-13 LAB — CBC WITH DIFFERENTIAL/PLATELET
BASOS ABS: 0 10*3/uL (ref 0.0–0.1)
Basophils Relative: 0 % (ref 0–1)
Eosinophils Absolute: 0 10*3/uL (ref 0.0–0.7)
Eosinophils Relative: 0 % (ref 0–5)
HEMATOCRIT: 24.8 % — AB (ref 39.0–52.0)
Hemoglobin: 8.2 g/dL — ABNORMAL LOW (ref 13.0–17.0)
LYMPHS PCT: 7 % — AB (ref 12–46)
Lymphs Abs: 0.6 10*3/uL — ABNORMAL LOW (ref 0.7–4.0)
MCH: 32 pg (ref 26.0–34.0)
MCHC: 33.1 g/dL (ref 30.0–36.0)
MCV: 96.9 fL (ref 78.0–100.0)
MONO ABS: 1.4 10*3/uL — AB (ref 0.1–1.0)
Monocytes Relative: 16 % — ABNORMAL HIGH (ref 3–12)
Neutro Abs: 6.6 10*3/uL (ref 1.7–7.7)
Neutrophils Relative %: 77 % (ref 43–77)
PLATELETS: 189 10*3/uL (ref 150–400)
RBC: 2.56 MIL/uL — ABNORMAL LOW (ref 4.22–5.81)
RDW: 14.5 % (ref 11.5–15.5)
WBC: 8.6 10*3/uL (ref 4.0–10.5)

## 2013-07-13 LAB — PROTIME-INR
INR: 0.98 (ref 0.00–1.49)
PROTHROMBIN TIME: 13 s (ref 11.6–15.2)

## 2013-07-13 LAB — GLUCOSE, CAPILLARY
GLUCOSE-CAPILLARY: 144 mg/dL — AB (ref 70–99)
GLUCOSE-CAPILLARY: 159 mg/dL — AB (ref 70–99)
Glucose-Capillary: 169 mg/dL — ABNORMAL HIGH (ref 70–99)
Glucose-Capillary: 156 mg/dL — ABNORMAL HIGH (ref 70–99)
Glucose-Capillary: 159 mg/dL — ABNORMAL HIGH (ref 70–99)
Glucose-Capillary: 130 mg/dL — ABNORMAL HIGH (ref 70–99)

## 2013-07-13 MED ORDER — LORAZEPAM 2 MG/ML IJ SOLN
1.0000 mg | Freq: Three times a day (TID) | INTRAMUSCULAR | Status: DC
  Administered 2013-07-13 – 2013-07-14 (×2): 1 mg via INTRAVENOUS
  Filled 2013-07-13 (×2): qty 1

## 2013-07-13 MED ORDER — METOPROLOL TARTRATE 25 MG PO TABS
25.0000 mg | ORAL_TABLET | Freq: Two times a day (BID) | ORAL | Status: DC
  Administered 2013-07-13 – 2013-07-23 (×19): 25 mg via ORAL
  Filled 2013-07-13 (×24): qty 1

## 2013-07-13 MED ORDER — POTASSIUM CHLORIDE 10 MEQ/50ML IV SOLN
10.0000 meq | INTRAVENOUS | Status: AC
  Administered 2013-07-13 (×4): 10 meq via INTRAVENOUS
  Filled 2013-07-13 (×4): qty 50

## 2013-07-13 MED ORDER — HYDROCORTISONE NA SUCCINATE PF 100 MG IJ SOLR
25.0000 mg | Freq: Four times a day (QID) | INTRAMUSCULAR | Status: DC
  Administered 2013-07-13 – 2013-07-14 (×4): 25 mg via INTRAVENOUS
  Filled 2013-07-13 (×8): qty 0.5

## 2013-07-13 MED ORDER — LORAZEPAM 2 MG/ML IJ SOLN: 1.0000 mg | Freq: Four times a day (QID) | INTRAMUSCULAR | Status: DC

## 2013-07-13 MED ORDER — HEPARIN SODIUM (PORCINE) 5000 UNIT/ML IJ SOLN
5000.0000 [IU] | Freq: Three times a day (TID) | INTRAMUSCULAR | Status: DC
  Administered 2013-07-13 – 2013-07-23 (×31): 5000 [IU] via SUBCUTANEOUS
  Filled 2013-07-13 (×35): qty 1

## 2013-07-13 MED ORDER — FREE WATER
200.0000 mL | Freq: Three times a day (TID) | Status: DC
  Administered 2013-07-13 – 2013-07-14 (×3): 200 mL

## 2013-07-13 MED ORDER — LEVETIRACETAM IN NACL 500 MG/100ML IV SOLN
500.0000 mg | Freq: Two times a day (BID) | INTRAVENOUS | Status: DC
  Administered 2013-07-13 – 2013-07-14 (×2): 500 mg via INTRAVENOUS
  Filled 2013-07-13 (×3): qty 100

## 2013-07-13 NOTE — Progress Notes (Signed)
At 2020 hrs upon initial assessment pt found in pool of blood with blood oozing from newly place LIJ CVC.   Pressure held for 45 minutes, thrombin pad placed and SQ heparin dc'd per Dr Chase Caller.  Dr Nelda Marseille  and [facility] PA Eddie Dibbles  Arrived at bedside at 2100 hrs.  At that time pt became hypertensive with BP 197/77 and spontaneously opened his eyes.  No  Movement of extremeties noted and pt followed no commands.  Placed on Propofol gtt per Dr Nelda Marseille and fentany  restarted at 75 mcg and weaned to 59 mcg/kg/ min.

## 2013-07-13 NOTE — Progress Notes (Signed)
[facility] ADULT ICU REPLACEMENT PROTOCOL FOR AM LAB REPLACEMENT ONLY  The patient does apply for the [facility] Adult ICU Electrolyte Replacment Protocol based on the criteria listed below:   1. Is GFR >/= 40 ml/min? Yes.    Patient's GFR today is >90 2. Is urine output >/= 0.5 ml/kg/hr for the last 6 hours? Yes.   Patient's UOP is 1.59 ml/kg/hr 3. Is BUN < 60 mg/dL? Yes.    Patient's BUN today is 20 4. Abnormal electrolyte(s): K+ 3.0 5. Ordered repletion with: see order 6. If a panic level lab has been reported, has the CCM MD in charge been notified? Yes.  .   Physician:  Dr. Westley Chandler, Loreal Schuessler A 07/13/2013 6:37 AM

## 2013-07-13 NOTE — Progress Notes (Signed)
PULMONARY / CRITICAL CARE MEDICINE  Name: Chase Blackwell MRN: [id number] DOB: [birth year] PCP Default, Provider, MD   ADMISSION DATE:  08/01/13  PRIMARY SERVICE: PCCM  CHIEF COMPLAINT:  VF Arrest -> Inferior STEMI (Cooling Protocol)  CARDIOLOGY: Harwani/Kadakia  BRIEF PATIENT DESCRIPTION:  Patient is a 48 year old male with PMH of asthma, HTN, EtOH abuse, chronic pain, schizoaffective d/o who presented to ER with VF arrest (down 15 mins) and inferior STEMI on 08-02-22. Cooling protocol started and taken to cath lab for PCI with RCA stent placement.  7/5 Patient rewarmed, began having fine seizure-like activity. Treated with versed, neuro consulted.   LINES / TUBES: ETT 2022-08-02 >>> R IJ CVL 7/4 occluded>>>7/9 7/9 left IJ>>> L Rad art line 7/4 >>>7/10  CULTURES: Urine 02-Aug-2022 >>> neg MRSA by PCR 08-02-22 >>> neg --------------------------------- 7/6 Sputum (Expectorated) >>>Klieb pan sens 7/6 Sputum ( Non-Expectorated) >>> 7/6 BC x 2 >>>  ANTIBIOTICS: Unasyn 7/6 >>> Vanc 7/7 >>>7/8  SIGNIFICANT EVENTS / STUDIES:  08-02-22 Intubated in field 08/02/2022 Taken to cath lab RCA occluded. Left system ok. S/p PCI 08/02/2022 Hypothermia protocol initiated 7/5 CT Head >>> no acute intracranial abnormality 7/5 EEG >>> activity marred by artifact but minimal 7/6 Rewarmed, normothermic 7/6 Continuous EEG stopped, myoclonic jerks remain 7/7- remains on sedation , tremors improved 7/8 MRI brain>>>neg acute, sinusitis 7/9- pause cardiac, reduced sedation, neg 1.6 liters 7/10- resolving rt hilar infiltrates  SUBJECTIVE:  No changes in neurostatus  VITAL SIGNS: Filed Vitals:   07/13/13 0730 07/13/13 0800 07/13/13 0810 07/13/13 0900  BP:  143/45 166/70 131/50  Pulse: 78 82 97 87  Temp: 100.6 F (38.1 C) 100.6 F (38.1 C) 100.8 F (38.2 C) 100.8 F (38.2 C)  TempSrc:  Core (Comment) Core (Comment)   Resp: 17 18  17   Height:      Weight:      SpO2: 95% 96%  91%   VENTILATOR SETTINGS: Vent Mode:  [-]  PRVC FiO2 (%):  [40 %-50 %] 40 % Set Rate:  [18 bmp] 18 bmp Vt Set:  [600 mL] 600 mL PEEP:  [5 cmH20] 5 cmH20 Plateau Pressure:  [10 cmH20-16 cmH20] 14 cmH20  INTAKE / OUTPUT: I/O last 3 completed shifts: In: 4855.8 [I.V.:915.8; NG/GT:2890; IV CBJSEGBTD:1761] Out: 6073 [Urine:5630]   PHYSICAL EXAMINATION: General: opened eyes Neuro: opens eyes spontaneously, suctioned opened, unresponsive otherwise HEENT: ett PULM: ronchi improved CV: s1 s2 RRR GI: soft, bw hypo , nr Extremities: edema unchanged   LABS: PULMONARY  Recent Labs Lab 07/10/13 0438 07/10/13 1221 07/11/13 0355 07/12/13 0925 07/12/13 1215  PHART 7.248* 7.344* 7.346* 7.568* 7.434  PCO2ART 54.8* 43.1 50.3* 31.1* 49.1*  PO2ART 74.3* 57.0* 110.0* 64.4* 71.4*  HCO3 23.1 23.5 26.8* 28.4* 32.4*  TCO2 24.8 25 28.3 29.4 33.9  O2SAT 96.1 87.0 99.6 94.9 93.9    CBC  Recent Labs Lab 07/11/13 0445 07/12/13 0425 07/13/13 0430  HGB 10.2* 9.1* 8.2*  HCT 30.9* 26.8* 24.8*  WBC 4.6 7.6 8.6  PLT 182 216 189    COAGULATION  Recent Labs Lab August 01, 2013 2145 07/07/13 0030 07/07/13 1652 07/13/13 0431  INR 1.06 4.12* 1.12 0.98    CARDIAC    Recent Labs Lab 08-01-2013 2359 07/07/13 0330 07/07/13 0800 07/08/13 0800 07/11/13 0500  TROPONINI 13.38* >20.00* >20.00* 7.11* 3.04*   No results found for this basename: PROBNP,  in the last 168 hours   CHEMISTRY  Recent Labs Lab 07/07/13 0300  07/09/13 0400 07/10/13 0400 07/11/13 0445 07/12/13 0425  07/13/13 0430  NA  --   < > 139 138 138 145 152*  K  --   < > 4.8 4.7 4.4 3.1* 3.0*  CL  --   < > 106 104 102 104 111  CO2  --   < > 20 22 25 29 30   GLUCOSE  --   < > 85 94 120* 184* 155*  BUN  --   < > 7 9 10 16 20   CREATININE  --   < > 0.70 0.63 0.60 0.83 0.81  CALCIUM  --   < > 8.4 8.3* 8.8 8.4 7.5*  MG 1.6  --   --   --   --   --   --   < > = values in this interval not displayed. Estimated Creatinine Clearance: 120.1 ml/min (by C-G formula based on  Cr of 0.81).   LIVER  Recent Labs Lab 07/06/13 2145 07/07/13 0020 07/07/13 0030 07/07/13 1652 07/10/13 0400 07/11/13 0445 07/13/13 0430 07/13/13 0431  AST 60* 85*  --   --  110* 69* 52*  --   ALT 30 32  --   --  47 44 46  --   ALKPHOS 63 57  --   --  60 229* 171*  --   BILITOT 0.2* 0.5  --   --  0.5 0.5 0.3  --   PROT 6.3 6.3  --   --  5.4* 5.2* 4.9*  --   ALBUMIN 3.5 3.8  --   --  2.2* 1.9* 1.8*  --   INR 1.06  --  4.12* 1.12  --   --   --  0.98     INFECTIOUS  Recent Labs Lab 07/07/13 0315  LATICACIDVEN 0.7     ENDOCRINE CBG (last 3)   Recent Labs  07/13/13 0011 07/13/13 0406 07/13/13 0830  GLUCAP 144* 159* 169*    IMAGING x48h  7/10- resolving hilar changes, ett  ASSESSMENT / PLAN:  PULMONARY A:  Acute respiratory failure - secondary to acute MI Hx of asthma Airway obstruction Apical PTX from CVL placement, tiny and not needing Rx P:   - attempt sbt today, with less sedation, cpap 5 ps 10, apnea, Tv alarms, also as rate now 18 successful (less MV) -trach would be medically ineffective - BDs  -did well with lasix, maintain  CARDIOVASCULAR A:  Ventricular fibrillation arrest Acute inferior STEMI due to occluded RCA Hx HTN Rel AI cardiaxc pause P:  - S/p PCI of RCA per cardiology  - DAPT, ASA, Statin - Levo now off - stress steroids lower today as off pressors -reduce sedation further  RENAL A: Neg balance hypokalemia P: - Consider diuresis  -chem in am  -k supp -add free water  INFECTIOUS A: R/o asp event pre ETT, s/o immunosuppressed from hypothermia protocol, poor dentition P: - Unasyn date in place  GASTROINTESTINAL A:   Stress ulcer prophylaxis Hx GERD P:   - PPI - TF  HEMATOLOGIC A: VTE PPx P: - SQ heparin restart no bleeding further line - Brilinta may need to hold if bleedin gnoted  ENDOCRINE A: Hypoglycemia - r/o underlying liver dz, improved with TF - resolved R/o sick euthyroid  (likely) P: -treat underlying illness -cbg   NEUROLOGIC A: S/p Cardiac Arrest - CPR 15 mins, anoxic brain injury Hx ETOH abuse Hx Schizophrenia Rigid - on haldol at home = at risk NMS R/o ETOH WD component  EEG for seizure-like activity >>> no  evidence of epileptiform activity P: - ativan reduction further - Continue Keppra -given his ETOH, psychiatric illness, and now likely anoxic injury,m I feel his fxnal recovery is concerning, neuro to re eval after further ativan reduction but also feels that fxnal recovery not likely    Brother speaknig to siblings for dnr consideration, Monday if not improved, consider comfort  Ccm time 30 min   Lavon Paganini. Titus Mould, MD, [place] Pgr: [facility]

## 2013-07-13 NOTE — Progress Notes (Signed)
Subjective:  Patient remains intubated unresponsive all narcotics being weaned off. Family deciding regarding CODE STATUS No further pauses Objective:  Vital Signs in the last 24 hours: Temp:  [99.5 F (37.5 C)-100.8 F (38.2 C)] 100.8 F (38.2 C) (07/10 1000) Pulse Rate:  [73-106] 106 (07/10 1100) Resp:  [4-25] 25 (07/10 1100) BP: (110-192)/(38-108) 192/108 mmHg (07/10 1100) SpO2:  [88 %-98 %] 96 % (07/10 1100) Arterial Line BP: (108-207)/(41-83) 207/83 mmHg (07/10 1100) FiO2 (%):  [40 %-50 %] 40 % (07/10 0811) Weight:  [86.2 kg (190 lb 0.6 oz)] 86.2 kg (190 lb 0.6 oz) (07/10 0436)  Intake/Output from previous day: 07/09 0701 - 07/10 0700 In: 2980.9 [I.V.:350.9; NG/GT:1880; IV Piggyback:750] Out: 7829 [Urine:3440] Intake/Output from this shift: Total I/O In: 625 [NG/GT:315; IV Piggyback:310] Out: 380 [Urine:380]  Physical Exam: Neck: no adenopathy, no carotid bruit, no JVD and supple, symmetrical, trachea midline Lungs: Decrease pressor at bases with occasional rhonchi Heart: regular rate and rhythm, S1, S2 normal and Soft systolic murmur noted Abdomen: Soft bowel sounds present Extremities: extremities normal, atraumatic, no cyanosis or edema  Lab Results:  Recent Labs  07/12/13 0425 07/13/13 0430  WBC 7.6 8.6  HGB 9.1* 8.2*  PLT 216 189    Recent Labs  07/12/13 0425 07/13/13 0430  NA 145 152*  K 3.1* 3.0*  CL 104 111  CO2 29 30  GLUCOSE 184* 155*  BUN 16 20  CREATININE 0.83 0.81    Recent Labs  07/11/13 0500  TROPONINI 3.04*   Hepatic Function Panel  Recent Labs  07/13/13 0430  PROT 4.9*  ALBUMIN 1.8*  AST 52*  ALT 46  ALKPHOS 171*  BILITOT 0.3   No results found for this basename: CHOL,  in the last 72 hours No results found for this basename: PROTIME,  in the last 72 hours  Imaging: Imaging results have been reviewed and Mr Brain Wo Contrast  07/11/2013   CLINICAL DATA:  Found down. Status post cardiac resuscitation. Extended CPR.  Altered mental status.  EXAM: MRI HEAD WITHOUT CONTRAST  TECHNIQUE: Multiplanar, multiecho pulse sequences of the brain and surrounding structures were obtained without intravenous contrast.  COMPARISON:  CT head without contrast 07/08/2013  FINDINGS: No acute infarct, hemorrhage, or mass lesion is present. The ventricles are of normal size. No significant extraaxial fluid collection is present.  Flow is present in the major intracranial arteries. The right vertebral artery is hypoplastic. No significant white matter disease is present. The globes and orbits are intact. Circumferential mucosal thickening is present in the left maxillary sinus. Extensive mucosal thickening is present throughout the ethmoid air cells and frontal sinuses. The sphenoid sinuses are partially opacified bilaterally. Fluid in the nasopharynx is likely related to intubation. Bilateral mastoid effusions are noted.  IMPRESSION: 1. Normal MRI appearance of the brain. 2. Extensive sinus and mastoid disease is likely related to intubation.   Electronically Signed   By: Lawrence Santiago M.D.   On: 07/11/2013 18:51   Dg Chest Port 1 View  07/13/2013   CLINICAL DATA:  Intubation, respiratory failure  EXAM: PORTABLE CHEST - 1 VIEW  COMPARISON:  07/12/2013  FINDINGS: Endotracheal tube is approximately 7 cm above the carina. Left IJ central line tip in the upper SVC at the azygos junction level. Right IJ central line has been removed. NG tube extends below the hemidiaphragms into the stomach with the tip not visualized. Stable heart size and central vascularity. Persistent bibasilar airspace opacities/atelectasis, worse on the right. No  enlarging effusion. No pneumothorax.  IMPRESSION: Stable support apparatus.  Persistent bibasilar airspace disease and/or atelectasis, worse on the right.   Electronically Signed   By: Daryll Brod M.D.   On: 07/13/2013 07:36   Dg Chest Port 1 View  07/12/2013   CLINICAL DATA:  Line placement  EXAM: PORTABLE CHEST -  1 VIEW  COMPARISON:  07/12/2013 at 6:24 a.m.  FINDINGS: Left internal jugular central venous line has its tip in the mid to lower superior vena cava near the tip of the right internal jugular central venous line. There is no pneumothorax.  Endotracheal tube and nasogastric tube remain well-positioned.  Persistent lung base opacities, without substantial change.  IMPRESSION: Left internal jugular central venous line tip lies in the mid to lower superior vena cava. No pneumothorax. No other change.   Electronically Signed   By: Lajean Manes M.D.   On: 07/12/2013 19:54   Dg Chest Port 1 View  07/12/2013   CLINICAL DATA:  Intubation.  EXAM: PORTABLE CHEST - 1 VIEW  COMPARISON:  Chest x-ray 07/11/2013.  FINDINGS: Endotracheal tube, NG tube, right IJ line in stable position. Persistent prominent bibasilar pulmonary infiltrates consistent pneumonia noted. There has been partial clearing right lower lobe infiltrate. No pleural effusion or pneumothorax. No acute osseus abnormality .  IMPRESSION: 1. Line and tube positions stable. 2. Persistent dense bibasilar pulmonary infiltrates consistent with pneumonia. There has been partial clearing of right lower lobe infiltrate .   Electronically Signed   By: Marcello Moores  Register   On: 07/12/2013 08:07    Cardiac Studies:  Assessment/Plan:  Acute inferior wall myocardial infarction status post PTCA stenting to 100% occluded RCA  Status post V. fib cardiac arrest  Status post transient questionable pause  Acute respiratory failure secondary to above rule out aspiration  Hypertension  History of bronchial asthma  GERD  History of schizoaffective disorder  EtOH abuse  Chronic pain syndrome  Probable anoxic encephalopathy  Plan Start low-dose Lopressor as per orders Dr. Doylene Canard on-call for weekend  LOS: 7 days    [facility] N 07/13/2013, 12:35 PM

## 2013-07-13 NOTE — Progress Notes (Signed)
Subjective: Opened eyes.   Exam: Filed Vitals:   07/13/13 1700  BP: 158/58  Pulse: 80  Temp: 101.5 F (38.6 C)  Resp: 18   Gen: In bed, NAD MS: opens eyes partialy to noxious stimulus.  follow commands.  PJ:SRPRX, eyes dysconjujate, corneals intact Motor: flexion to noxious stimuli.  Sensory:as above.  DTR:3+ on left, 2+ on right. No sustained clonus today.   MRI - no acute changes, I question an area of mild cortical ribboning in the left parietal region, but this certainly could be artifact.   Impression: 48 yo M with persistent mental status changes following cardiac arrest. MRI does not show changes, but it can be negative even in the setting of hypoxic injury. He continues to be densely encephalopathic.   Recommendations: 1) Would decrease ativan to 1mg  q4h with plans to further downtitrate.  2) Continue keppra for now, but no clear evidence of seizure.  3) will continue to follow.  4) decrease ativan to 1mg  q8h 5) will continue to follow.   Roland Rack, MD [facility] [phone]  If 7pm- 7am, please page neurology on call as listed in [place].

## 2013-07-14 DIAGNOSIS — E870 Hyperosmolality and hypernatremia: Secondary | ICD-10-CM

## 2013-07-14 LAB — BASIC METABOLIC PANEL
ANION GAP: 11 (ref 5–15)
BUN: 25 mg/dL — AB (ref 6–23)
CO2: 32 mEq/L (ref 19–32)
CREATININE: 0.92 mg/dL (ref 0.50–1.35)
Calcium: 8.2 mg/dL — ABNORMAL LOW (ref 8.4–10.5)
Chloride: 113 mEq/L — ABNORMAL HIGH (ref 96–112)
GFR calc Af Amer: >90 mL/min (ref >90)
GFR calc non Af Amer: >90 mL/min (ref >90)
Glucose, Bld: 170 mg/dL — ABNORMAL HIGH (ref 70–99)
Potassium: 3.4 mEq/L — ABNORMAL LOW (ref 3.7–5.3)
Sodium: 156 mEq/L — ABNORMAL HIGH (ref 137–147)

## 2013-07-14 LAB — CBC
HEMATOCRIT: 27.5 % — AB (ref 39.0–52.0)
Hemoglobin: 8.9 g/dL — ABNORMAL LOW (ref 13.0–17.0)
MCH: 32.2 pg (ref 26.0–34.0)
MCHC: 32.4 g/dL (ref 30.0–36.0)
MCV: 99.6 fL (ref 78.0–100.0)
Platelets: 227 10*3/uL (ref 150–400)
RBC: 2.76 MIL/uL — ABNORMAL LOW (ref 4.22–5.81)
RDW: 14.9 % (ref 11.5–15.5)
WBC: 12.1 10*3/uL — ABNORMAL HIGH (ref 4.0–10.5)

## 2013-07-14 LAB — GLUCOSE, CAPILLARY
GLUCOSE-CAPILLARY: 147 mg/dL — AB (ref 70–99)
GLUCOSE-CAPILLARY: 137 mg/dL — AB (ref 70–99)
GLUCOSE-CAPILLARY: 143 mg/dL — AB (ref 70–99)
Glucose-Capillary: 156 mg/dL — ABNORMAL HIGH (ref 70–99)
Glucose-Capillary: 168 mg/dL — ABNORMAL HIGH (ref 70–99)
Glucose-Capillary: 172 mg/dL — ABNORMAL HIGH (ref 70–99)

## 2013-07-14 MED ORDER — HYDRALAZINE HCL 25 MG PO TABS
25.0000 mg | ORAL_TABLET | Freq: Three times a day (TID) | ORAL | Status: DC
  Administered 2013-07-14 – 2013-07-23 (×27): 25 mg via ORAL
  Filled 2013-07-14 (×30): qty 1

## 2013-07-14 MED ORDER — HYDROCORTISONE NA SUCCINATE PF 100 MG IJ SOLR
25.0000 mg | Freq: Two times a day (BID) | INTRAMUSCULAR | Status: DC
  Administered 2013-07-14 – 2013-07-15 (×2): 25 mg via INTRAVENOUS
  Filled 2013-07-14 (×4): qty 0.5

## 2013-07-14 MED ORDER — SODIUM CHLORIDE 0.9 % IV SOLN
500.0000 mg | Freq: Two times a day (BID) | INTRAVENOUS | Status: DC
  Administered 2013-07-14 – 2013-07-16 (×4): 500 mg via INTRAVENOUS
  Filled 2013-07-14 (×5): qty 5

## 2013-07-14 MED ORDER — LABETALOL HCL 5 MG/ML IV SOLN
INTRAVENOUS | Status: AC
  Filled 2013-07-14: qty 4

## 2013-07-14 MED ORDER — PNEUMOCOCCAL VAC POLYVALENT 25 MCG/0.5ML IJ INJ
0.5000 mL | INJECTION | INTRAMUSCULAR | Status: AC
  Administered 2013-07-15: 0.5 mL via INTRAMUSCULAR
  Filled 2013-07-14: qty 0.5

## 2013-07-14 MED ORDER — LABETALOL HCL 5 MG/ML IV SOLN
10.0000 mg | Freq: Four times a day (QID) | INTRAVENOUS | Status: DC | PRN
  Administered 2013-07-14 – 2013-07-15 (×3): 10 mg via INTRAVENOUS
  Administered 2013-07-15: 20 mg via INTRAVENOUS
  Administered 2013-07-15: 10 mg via INTRAVENOUS
  Administered 2013-07-22: 20 mg via INTRAVENOUS
  Filled 2013-07-14 (×4): qty 4

## 2013-07-14 MED ORDER — HYDRALAZINE HCL 25 MG PO TABS
25.0000 mg | ORAL_TABLET | Freq: Three times a day (TID) | ORAL | Status: DC
  Filled 2013-07-14 (×3): qty 1

## 2013-07-14 MED ORDER — POTASSIUM CHLORIDE 10 MEQ/50ML IV SOLN
10.0000 meq | INTRAVENOUS | Status: AC
  Administered 2013-07-14 (×2): 10 meq via INTRAVENOUS
  Filled 2013-07-14 (×2): qty 50

## 2013-07-14 MED ORDER — FREE WATER
200.0000 mL | Status: DC
  Administered 2013-07-14 – 2013-07-19 (×31): 200 mL

## 2013-07-14 NOTE — Progress Notes (Addendum)
PULMONARY / CRITICAL CARE MEDICINE  Name: Chase Blackwell MRN: [id number] DOB: Feb 01, 1965 PCP Default, Provider, MD   ADMISSION DATE:  07-26-2013  PRIMARY SERVICE: PCCM  CHIEF COMPLAINT:  VF Arrest -> Inferior STEMI (Cooling Protocol)  CARDIOLOGY: Harwani/Kadakia  BRIEF PATIENT DESCRIPTION:  Patient is a 48 year old male with PMH of asthma, HTN, EtOH abuse, chronic pain, schizoaffective d/o who presented to ER with VF arrest (down 15 mins) and inferior STEMI on 07/27/22. Cooling protocol started and taken to cath lab for PCI with RCA stent placement.  7/5 Patient rewarmed, began having fine seizure-like activity. Treated with versed, neuro consulted.   LINES / TUBES: ETT 07-27-22 >>> R IJ CVL 7/4 occluded>>>7/9 7/9 left IJ>>> L Rad art line 7/4 >>>7/10  CULTURES: Urine Jul 27, 2022 >>> neg MRSA by PCR 07/27/22 >>> neg --------------------------------- 7/6 resp>>>Klieb pan sens 7/6 BC x 2 >>>coag neg staph  ANTIBIOTICS: Unasyn 7/6 >>> Vanc 7/7 >>>7/8  SIGNIFICANT EVENTS / STUDIES:  2022-07-27 Intubated in field 2022/07/27 Taken to cath lab RCA occluded. Left system ok. S/p PCI Jul 27, 2022 Hypothermia protocol initiated 7/5 CT Head >>> no acute intracranial abnormality 7/5 EEG >>> activity marred by artifact but minimal 7/6 Rewarmed, normothermic 7/6 Continuous EEG stopped, myoclonic jerks remain 7/7- remains on sedation , tremors improved 7/8 MRI brain>>>neg acute, sinusitis 7/9- pause cardiac, reduced sedation, neg 1.6 liters 7/10- resolving rt hilar infiltrates  SUBJECTIVE:  No acute events  VITAL SIGNS: Filed Vitals:   07/14/13 0600 07/14/13 0700 07/14/13 0800 07/14/13 0900  BP: 143/60 135/64 164/75 175/65  Pulse: 60 60 75 67  Temp: 99.5 F (37.5 C) 99.5 F (37.5 C) 99.3 F (37.4 C) 99.3 F (37.4 C)  TempSrc:   Core (Comment)   Resp: 16 18 19 18   Height:      Weight:      SpO2: 90% 92% 94% 93%   VENTILATOR SETTINGS: Vent Mode:  [-] PRVC FiO2 (%):  [40 %] 40 % Set Rate:  [18 bmp] 18 bmp Vt  Set:  [600 mL] 600 mL PEEP:  [5 cmH20] 5 cmH20 Plateau Pressure:  [10 cmH20-17 cmH20] 14 cmH20  INTAKE / OUTPUT: I/O last 3 completed shifts: In: 4959.4 [I.V.:534.4; NG/GT:3115; IV Piggyback:1310] Out: 9371 [Urine:4245]   PHYSICAL EXAMINATION:  Gen: eyes open, on vent HEENT: PERRL, ETT  PULM: crackles R base, otherwise clear CV: RRR, no mgr AB: BS+ soft nontender Ext: warm no edema Neuro: opens eyes to voice, does not withdraw to pain on my exam   LABS: PULMONARY  Recent Labs Lab 07/10/13 0438 07/10/13 1221 07/11/13 0355 07/12/13 0925 07/12/13 1215  PHART 7.248* 7.344* 7.346* 7.568* 7.434  PCO2ART 54.8* 43.1 50.3* 31.1* 49.1*  PO2ART 74.3* 57.0* 110.0* 64.4* 71.4*  HCO3 23.1 23.5 26.8* 28.4* 32.4*  TCO2 24.8 25 28.3 29.4 33.9  O2SAT 96.1 87.0 99.6 94.9 93.9    CBC  Recent Labs Lab 07/12/13 0425 07/13/13 0430 07/14/13 0425  HGB 9.1* 8.2* 8.9*  HCT 26.8* 24.8* 27.5*  WBC 7.6 8.6 12.1*  PLT 216 189 227    COAGULATION  Recent Labs Lab 07/07/13 1652 07/13/13 0431  INR 1.12 0.98    CARDIAC    Recent Labs Lab 07/08/13 0800 07/11/13 0500  TROPONINI 7.11* 3.04*   No results found for this basename: PROBNP,  in the last 168 hours   CHEMISTRY  Recent Labs Lab 07/10/13 0400 07/11/13 0445 07/12/13 0425 07/13/13 0430 07/14/13 0425  NA 138 138 145 152* 156*  K 4.7 4.4 3.1*  3.0* 3.4*  CL 104 102 104 111 113*  CO2 22 25 29 30  32  GLUCOSE 94 120* 184* 155* 170*  BUN 9 10 16 20  25*  CREATININE 0.63 0.60 0.83 0.81 0.92  CALCIUM 8.3* 8.8 8.4 7.5* 8.2*   Estimated Creatinine Clearance: 105.7 ml/min (by C-G formula based on Cr of 0.92).   LIVER  Recent Labs Lab 07/07/13 1652 07/10/13 0400 07/11/13 0445 07/13/13 0430 07/13/13 0431  AST  --  110* 69* 52*  --   ALT  --  47 44 46  --   ALKPHOS  --  60 229* 171*  --   BILITOT  --  0.5 0.5 0.3  --   PROT  --  5.4* 5.2* 4.9*  --   ALBUMIN  --  2.2* 1.9* 1.8*  --   INR 1.12  --   --   --   0.98     INFECTIOUS No results found for this basename: LATICACIDVEN, PROCALCITON,  in the last 168 hours   ENDOCRINE CBG (last 3)   Recent Labs  07/13/13 2337 07/14/13 0332 07/14/13 0827  GLUCAP 168* 156* 172*    IMAGING x48h  7/11-  RLL infiltrate  ASSESSMENT / PLAN:  PULMONARY A:  Acute respiratory failure - secondary to acute MI; failed SBT on 7/11  Hx of asthma Airway obstruction HCAP P:   - daily SBT/WUA - trach would be medically ineffective - BDs  - did well with lasix, maintain  CARDIOVASCULAR A:  Ventricular fibrillation arrest Acute inferior STEMI due to occluded RCA Hx HTN  P:  - S/p PCI of RCA per cardiology  - DAPT, ASA, Statin - wean stress steroids  RENAL A: Neg balance hypokalemia P: - chem in am  - k supp - increase free water  INFECTIOUS A: Aspiration pneumonia P: - Unasyn end date in place  GASTROINTESTINAL A:   Stress ulcer prophylaxis Hx GERD P:   - PPI - TF  HEMATOLOGIC A: VTE PPx P: - SQ heparin restart no bleeding further line - Brilinta may need to hold if bleedin gnoted  ENDOCRINE A: Hypoglycemia - r/o underlying liver dz, improved with TF - resolved R/o sick euthyroid (likely) P: -treat underlying illness -cbg   NEUROLOGIC A: S/p Cardiac Arrest - CPR 15 mins, anoxic brain injury Hx ETOH abuse Hx Schizophrenia Rigid - on haldol at home = at risk NMS R/o ETOH WD component  EEG for seizure-like activity >>> no evidence of epileptiform activity Anoxic encephalopathy > given his ETOH, psychiatric illness, and now likely anoxic injury, we feel likelihood for meaningful neurologic recovery is poor P: - ativan reduction further - Continue Keppra - f/u neurology recs - d/c benzo    Family considering withdrawal of support on Monday July 13  Ccm time 35 min   Roselie Awkward, MD [place] [facility] Pager: [phone] Cell: [phone] If no response, call [phone]

## 2013-07-14 NOTE — Progress Notes (Signed)
[facility] ADULT ICU REPLACEMENT PROTOCOL FOR AM LAB REPLACEMENT ONLY  The patient does apply for the [facility] Adult ICU Electrolyte Replacment Protocol based on the criteria listed below:   1. Is GFR >/= 40 ml/min? Yes.    Patient's GFR today is >90 2. Is urine output >/= 0.5 ml/kg/hr for the last 6 hours? Yes.   Patient's UOP is 1.37 ml/kg/hr 3. Is BUN < 60 mg/dL? Yes.    Patient's BUN today is 25 4. Abnormal electrolyte(s): k+ 3.4 5. Ordered repletion with: see order 6. If a panic level lab has been reported, has the CCM MD in charge been notified? Yes.  .   Physician:  Dr. Jodi Geralds, Tyah Acord A 07/14/2013 6:09 AM

## 2013-07-14 NOTE — Progress Notes (Signed)
ANTIBIOTIC CONSULT NOTE  Pharmacy Consult for  Unasyn Indication: aspiration pneumonia   Allergies  Allergen Reactions  . Hctz [Hydrochlorothiazide] Other (See Comments)    Dizzy spells  . Hydroxyzine Hives  . Sulfonamide Derivatives Hives  . Cetirizine & Related Other (See Comments)    unknown    Patient Measurements: Height: 5\' 11"  (180.3 cm) Weight: 187 lb 2.7 oz (84.9 kg) IBW/kg (Calculated) : 75.3   Vital Signs: Temp: 101.3 F (38.5 C) (07/11 1400) Temp src: Core (Comment) (07/11 1355) BP: 188/76 mmHg (07/11 1400) Pulse Rate: 86 (07/11 1400) Intake/Output from previous day: 07/10 0701 - 07/11 0700 In: 3595 [I.V.:390; KN/LZ:7673; IV Piggyback:960] Out: 2635 [Urine:2635] Intake/Output from this shift: Total I/O In: 1145 [I.V.:120; NG/GT:725; IV Piggyback:300] Out: 800 [Urine:800]  Labs:  Recent Labs  07/12/13 0425 07/13/13 0430 07/14/13 0425  WBC 7.6 8.6 12.1*  HGB 9.1* 8.2* 8.9*  PLT 216 189 227  CREATININE 0.83 0.81 0.92   Estimated Creatinine Clearance: 105.7 ml/min (by C-G formula based on Cr of 0.92).  Assessment: 32 YOM found down s/p VF arrest, inferior STEMI and hypothermia protocol on 7/3. Rewarmed on 7/5. Vanc and Day # 6 of 8 Unasyn for possible aspiration. Klebsiella in 7/6 respiratory culture, pan sensitive. 7/6 Blood cultures with 1/2 GPC in clusters, received Vanc 7/6-7/8.  WBC 4.6, Tmax 101.7, WBC up to 12.1. Tapering steroids. Renal function stable.  Goal of Therapy:  appropriate Unasyn dose for renal function and infection  Plan:   Continue Unasyn 3 grams IV q6h.   Stop date in place; to stop after all doses on 7/12.  Will follow up for any need to continue beyond that time.  Kelvin Cellar, RPh [place]: [phone] 07/14/2013 2:40 PM

## 2013-07-14 NOTE — Progress Notes (Signed)
Subjective: Eyes open spontaneously  Exam: Filed Vitals:   07/14/13 1600  BP: 182/77  Pulse: 79  Temp: 101.5 F (38.6 C)  Resp: 21   Gen: In bed, NAD MS: opens eyes spontaneously. Does not follow commands.  JO:ITGPQ,  Does not fixate or track, does not blink to threat.  Motor: minimal flexion to noxious stimuli.  Sensory:as above.  DTR:3+ on left, 2+ on right. No sustained clonus today.   MRI - no acute changes, I question an area of mild cortical ribboning in the left parietal region, but this certainly could be artifact.   Impression: 48 yo M with persistent mental status changes following cardiac arrest. MRI does not show changes, but it can be negative even in the setting of hypoxic injury. He continues to be densely encephalopathic. Despite his eye opening, I still think that he has likely had a degree of anoxic brain injury.   Recommendations: 1) lorazepam has been discontinued.  2) Continue keppra for now, but no clear evidence of seizure.  3) will continue to follow.   Roland Rack, MD [facility] [phone]  If 7pm- 7am, please page neurology on call as listed in [place].

## 2013-07-14 NOTE — Progress Notes (Signed)
Ref: Default, Provider, MD   Subjective:  Intubated. Off sedation. EEG negative for seizure activity. T max 101.7 F. Sinus rhythm with APCs  Objective:  Vital Signs in the last 24 hours: Temp:  [98.5 F (36.9 C)-101.7 F (38.7 C)] 99.5 F (37.5 C) (07/11 0700) Pulse Rate:  [60-106] 60 (07/11 0700) Cardiac Rhythm:  [-] Normal sinus rhythm (07/11 0400) Resp:  [14-25] 18 (07/11 0700) BP: (131-192)/(50-108) 135/64 mmHg (07/11 0700) SpO2:  [90 %-96 %] 92 % (07/11 0700) Arterial Line BP: (136-207)/(56-83) 172/66 mmHg (07/10 1300) FiO2 (%):  [40 %] 40 % (07/11 0757) Weight:  [84.9 kg (187 lb 2.7 oz)] 84.9 kg (187 lb 2.7 oz) (07/11 0500)  Physical Exam: BP Readings from Last 1 Encounters:  07/14/13 135/64    Wt Readings from Last 1 Encounters:  07/14/13 84.9 kg (187 lb 2.7 oz)    Weight change: -1.3 kg (-2 lb 13.9 oz)  HEENT: [place]/AT, Eyes-Blue, Pupils move sluggishly. Conjunctiva-Pale, Sclera-Non-icteric. Intubated Neck: No JVD, No bruit, Trachea midline. Lungs:  Clearing, Bilateral. Cardiac: Regular rhythm, normal S1 and S2, no S3. II/VI systolic murmur. Abdomen:  Soft, non-tender. Extremities:  Trace edema present. No cyanosis. No clubbing. CNS: AxOx0,  Skin: Warm and dry.  Intake/Output from previous day: 07/10 0701 - 07/11 0700 In: 3480 [I.V.:370; NG/GT:2150; IV Piggyback:960] Out: 2635 [Urine:2635]    Lab Results: BMET    Component Value Date/Time   NA 156* 07/14/2013 0425   NA 152* 07/13/2013 0430   NA 145 07/12/2013 0425   K 3.4* 07/14/2013 0425   K 3.0* 07/13/2013 0430   K 3.1* 07/12/2013 0425   CL 113* 07/14/2013 0425   CL 111 07/13/2013 0430   CL 104 07/12/2013 0425   CO2 32 07/14/2013 0425   CO2 30 07/13/2013 0430   CO2 29 07/12/2013 0425   GLUCOSE 170* 07/14/2013 0425   GLUCOSE 155* 07/13/2013 0430   GLUCOSE 184* 07/12/2013 0425   BUN 25* 07/14/2013 0425   BUN 20 07/13/2013 0430   BUN 16 07/12/2013 0425   CREATININE 0.92 07/14/2013 0425   CREATININE 0.81 07/13/2013 0430    CREATININE 0.83 07/12/2013 0425   CALCIUM 8.2* 07/14/2013 0425   CALCIUM 7.5* 07/13/2013 0430   CALCIUM 8.4 07/12/2013 0425   GFRNONAA >90 07/14/2013 0425   GFRNONAA >90 07/13/2013 0430   GFRNONAA >90 07/12/2013 0425   GFRAA >90 07/14/2013 0425   GFRAA >90 07/13/2013 0430   GFRAA >90 07/12/2013 0425   CBC    Component Value Date/Time   WBC 12.1* 07/14/2013 0425   RBC 2.76* 07/14/2013 0425   HGB 8.9* 07/14/2013 0425   HCT 27.5* 07/14/2013 0425   PLT 227 07/14/2013 0425   MCV 99.6 07/14/2013 0425   MCH 32.2 07/14/2013 0425   MCHC 32.4 07/14/2013 0425   RDW 14.9 07/14/2013 0425   LYMPHSABS 0.6* 07/13/2013 0430   MONOABS 1.4* 07/13/2013 0430   EOSABS 0.0 07/13/2013 0430   BASOSABS 0.0 07/13/2013 0430   HEPATIC Function Panel  Recent Labs  07/10/13 0400 07/11/13 0445 07/13/13 0430  PROT 5.4* 5.2* 4.9*   HEMOGLOBIN A1C No components found with this basename: HGA1C,  MPG   CARDIAC ENZYMES Lab Results  Component Value Date   CKTOTAL 128 10/19/2008   CKMB 2.1 10/19/2008   TROPONINI 3.04* 07/11/2013   TROPONINI 7.11* 07/08/2013   TROPONINI >20.00* 07/07/2013   BNP  Recent Labs  02/01/13 1221 06/27/13 1933  PROBNP 192.7* 146.2*   TSH  Recent Labs  07/09/13 1211  TSH 0.261*   CHOLESTEROL No results found for this basename: CHOL,  in the last 8760 hours  Scheduled Meds: . albuterol  3 mL Inhalation Q6H  . alteplase  2 mg Intracatheter Once  . ampicillin-sulbactam (UNASYN) IV  3 g Intravenous Q6H  . antiseptic oral rinse  15 mL Mouth Rinse QID  . aspirin  81 mg Oral Daily  . atorvastatin  80 mg Oral q1800  . chlorhexidine  15 mL Mouth Rinse BID  . feeding supplement (PRO-STAT SUGAR FREE 64)  30 mL Per Tube BID  . folic acid  1 mg Per Tube Daily  . free water  200 mL Per Tube 3 times per day  . heparin subcutaneous  5,000 Units Subcutaneous 3 times per day  . hydrocortisone sodium succinate  25 mg Intravenous 4 times per day  . insulin aspart  2-6 Units Subcutaneous 6 times per day   . levETIRAcetam  500 mg Intravenous Q12H  . LORazepam  1 mg Intravenous 3 times per day  . metoprolol tartrate  25 mg Oral BID  . pantoprazole (PROTONIX) IV  40 mg Intravenous QHS  . potassium chloride  10 mEq Intravenous Q1 Hr x 2  . [START ON 07/16/2013] thiamine  100 mg Per Tube Daily  . thiamine IV  500 mg Intravenous TID  . ticagrelor  90 mg Oral BID   Continuous Infusions: . sodium chloride 20 mL/hr at 07/13/13 2000  . feeding supplement (VITAL 1.5 CAL) 1,000 mL (07/13/13 1641)  . fentaNYL infusion INTRAVENOUS Stopped (07/13/13 0730)  . propofol Stopped (07/13/13 0730)   PRN Meds:.acetaminophen (TYLENOL) oral liquid 160 mg/5 mL, artificial tears, fentaNYL, midazolam  Assessment/Plan: Acute inferior wall myocardial infarction status post PTCA stenting to 100% occluded RCA  Status post V. 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    LOS: 8 days    Dixie Dials  MD  07/14/2013, 8:05 AM

## 2013-07-14 NOTE — Progress Notes (Signed)
07/14/2013 1440 BP stil elevated after total dose of Labetalol 20 mg. Dr. Lake Bells called and orders received. Chase Blackwell, Carolynn Comment

## 2013-07-15 ENCOUNTER — Inpatient Hospital Stay (HOSPITAL_COMMUNITY): Payer: Medicare Other

## 2013-07-15 DIAGNOSIS — I2119 ST elevation (STEMI) myocardial infarction involving other coronary artery of inferior wall: Secondary | ICD-10-CM | POA: Diagnosis not present

## 2013-07-15 LAB — BENZODIAZEPINE, QUANTITATIVE, URINE
ALPRAZOLAMU: NEGATIVE ng/mL (ref <25)
Clonazepam metabolite (GC/LC/MS), ur confirm: NEGATIVE ng/mL (ref <25)
Flurazepam GC/MS Conf: NEGATIVE ng/mL (ref <50)
LORAZEPAMU: NEGATIVE ng/mL (ref <50)
Midazolam (GC/LC/MS), ur confirm: 4878 ng/mL — ABNORMAL HIGH (ref <50)
NORDIAZEPAM GC/MS CONF: NEGATIVE ng/mL (ref <50)
Oxazepam GC/MS Conf: NEGATIVE ng/mL (ref <50)
Temazepam GC/MS Conf: NEGATIVE ng/mL (ref <50)
Triazolam metabolite (GC/LC/MS), ur confirm: NEGATIVE ng/mL (ref <50)

## 2013-07-15 LAB — BASIC METABOLIC PANEL
ANION GAP: 15 (ref 5–15)
BUN: 20 mg/dL (ref 6–23)
CO2: 29 meq/L (ref 19–32)
Calcium: 8.1 mg/dL — ABNORMAL LOW (ref 8.4–10.5)
Chloride: 106 mEq/L (ref 96–112)
Creatinine, Ser: 0.82 mg/dL (ref 0.50–1.35)
GFR calc non Af Amer: >90 mL/min (ref >90)
Glucose, Bld: 137 mg/dL — ABNORMAL HIGH (ref 70–99)
POTASSIUM: 3.2 meq/L — AB (ref 3.7–5.3)
Sodium: 150 mEq/L — ABNORMAL HIGH (ref 137–147)

## 2013-07-15 LAB — CBC WITH DIFFERENTIAL/PLATELET
BASOS PCT: 0 % (ref 0–1)
Basophils Absolute: 0 10*3/uL (ref 0.0–0.1)
EOS PCT: 0 % (ref 0–5)
Eosinophils Absolute: 0 10*3/uL (ref 0.0–0.7)
HEMATOCRIT: 29.8 % — AB (ref 39.0–52.0)
HEMOGLOBIN: 9.5 g/dL — AB (ref 13.0–17.0)
LYMPHS PCT: 13 % (ref 12–46)
Lymphs Abs: 1.9 10*3/uL (ref 0.7–4.0)
MCH: 31.9 pg (ref 26.0–34.0)
MCHC: 31.9 g/dL (ref 30.0–36.0)
MCV: 100 fL (ref 78.0–100.0)
MONOS PCT: 9 % (ref 3–12)
Monocytes Absolute: 1.3 10*3/uL — ABNORMAL HIGH (ref 0.1–1.0)
NEUTROS ABS: 11.4 10*3/uL — AB (ref 1.7–7.7)
NEUTROS PCT: 78 % — AB (ref 43–77)
Platelets: 276 10*3/uL (ref 150–400)
RBC: 2.98 MIL/uL — AB (ref 4.22–5.81)
RDW: 14.5 % (ref 11.5–15.5)
WBC: 14.6 10*3/uL — AB (ref 4.0–10.5)

## 2013-07-15 LAB — CULTURE, BLOOD (ROUTINE X 2): Culture: NO GROWTH

## 2013-07-15 LAB — THC (MARIJUANA), URINE, CONFIRMATION: Marijuana, Ur-Confirmation: 187 ng/mL — ABNORMAL HIGH (ref <5)

## 2013-07-15 LAB — GLUCOSE, CAPILLARY
GLUCOSE-CAPILLARY: 148 mg/dL — AB (ref 70–99)
Glucose-Capillary: 119 mg/dL — ABNORMAL HIGH (ref 70–99)
Glucose-Capillary: 146 mg/dL — ABNORMAL HIGH (ref 70–99)
Glucose-Capillary: 148 mg/dL — ABNORMAL HIGH (ref 70–99)
Glucose-Capillary: 179 mg/dL — ABNORMAL HIGH (ref 70–99)
Glucose-Capillary: 180 mg/dL — ABNORMAL HIGH (ref 70–99)

## 2013-07-15 MED ORDER — POTASSIUM CHLORIDE 20 MEQ/15ML (10%) PO LIQD
30.0000 meq | ORAL | Status: AC
  Administered 2013-07-15 (×2): 30 meq
  Filled 2013-07-15 (×3): qty 22.5

## 2013-07-15 MED ORDER — HYDROCORTISONE NA SUCCINATE PF 100 MG IJ SOLR
25.0000 mg | Freq: Every day | INTRAMUSCULAR | Status: DC
  Filled 2013-07-15: qty 0.5

## 2013-07-15 MED ORDER — FENTANYL CITRATE 0.05 MG/ML IJ SOLN
25.0000 ug | INTRAMUSCULAR | Status: DC | PRN
Start: 2013-07-15 — End: 2013-07-23
  Administered 2013-07-15 – 2013-07-19 (×16): 100 ug via INTRAVENOUS
  Administered 2013-07-19: 50 ug via INTRAVENOUS
  Administered 2013-07-19 – 2013-07-21 (×6): 100 ug via INTRAVENOUS
  Administered 2013-07-23: 50 ug via INTRAVENOUS
  Filled 2013-07-15 (×25): qty 2

## 2013-07-15 MED ORDER — IBUPROFEN 100 MG/5ML PO SUSP
200.0000 mg | Freq: Four times a day (QID) | ORAL | Status: DC | PRN
  Administered 2013-07-15: 200 mg via ORAL
  Filled 2013-07-15: qty 10

## 2013-07-15 NOTE — Progress Notes (Signed)
Subjective: Eyes open spontaneously   Exam: Filed Vitals:   07/15/13 1234  BP: 152/77  Pulse:   Temp:   Resp:    Gen: In bed, NAD MS: opens eyes spontaneously. Does not follow commands.  HD:QQIWL,  Does not fixate or track, does not blink to threat.  Motor: flexion to noxious stimuli.  Sensory:as above.  DTR:3+ on left, 2+ on right.   Impression: 48 yo M with persistent mental status changes following cardiac arrest. MRI does not show changes, but it can be negative even in the setting of hypoxic injury. He continues to be densely encephalopathic. Despite his eye opening, I still think that he has likely had a degree of anoxic brain injury.   Recommendations: 1) lorazepam has been discontinued.  2) Continue keppra for now, but no clear evidence of seizure.  3) will continue to follow.   Roland Rack, MD [facility] [phone]  If 7pm- 7am, please page neurology on call as listed in [place].

## 2013-07-15 NOTE — Progress Notes (Signed)
Ref: Default, Provider, MD   Subjective:  Remains intubated. Elevated temperature even with tylenol use. Monitor shows sinus rhythm with APCs. Oxygen sat 96 %.  Objective:  Vital Signs in the last 24 hours: Temp:  [99.3 F (37.4 C)-102 F (38.9 C)] 100.9 F (38.3 C) (07/12 0700) Pulse Rate:  [67-100] 73 (07/12 0700) Cardiac Rhythm:  [-] Normal sinus rhythm (07/12 0400) Resp:  [14-27] 21 (07/12 0700) BP: (157-208)/(58-93) 157/58 mmHg (07/12 0700) SpO2:  [84 %-98 %] 94 % (07/12 0700) FiO2 (%):  [30 %-50 %] 30 % (07/12 0700) Weight:  [86 kg (189 lb 9.5 oz)] 86 kg (189 lb 9.5 oz) (07/12 0411)  Physical Exam: BP Readings from Last 1 Encounters:  07/15/13 157/58    Wt Readings from Last 1 Encounters:  07/15/13 86 kg (189 lb 9.5 oz)    Weight change: 1.1 kg (2 lb 6.8 oz)  HEENT: [place]/AT, Eyes-Blue, Pupil moving sluggishly, opens eyes but not following. Conjunctiva-Pale, Sclera-Non-icteric. Intubated Neck: No JVD, No bruit, Trachea midline. Lungs:  Clearing, Bilateral. Cardiac:  Regular rhythm, normal S1 and S2, no S3. II/VI systolic murmur. Abdomen:  Soft, non-tender. Extremities:  Trace edema present. No cyanosis. No clubbing. CNS: AxOx0. Skin: Warm and dry.   Intake/Output from previous day: 07/11 0701 - 07/12 0700 In: 3970 [I.V.:440; NG/GT:2775; IV Piggyback:755] Out: 8299 [Urine:4350]    Lab Results: BMET    Component Value Date/Time   NA 150* 07/15/2013 0400   NA 156* 07/14/2013 0425   NA 152* 07/13/2013 0430   K 3.2* 07/15/2013 0400   K 3.4* 07/14/2013 0425   K 3.0* 07/13/2013 0430   CL 106 07/15/2013 0400   CL 113* 07/14/2013 0425   CL 111 07/13/2013 0430   CO2 29 07/15/2013 0400   CO2 32 07/14/2013 0425   CO2 30 07/13/2013 0430   GLUCOSE 137* 07/15/2013 0400   GLUCOSE 170* 07/14/2013 0425   GLUCOSE 155* 07/13/2013 0430   BUN 20 07/15/2013 0400   BUN 25* 07/14/2013 0425   BUN 20 07/13/2013 0430   CREATININE 0.82 07/15/2013 0400   CREATININE 0.92 07/14/2013 0425   CREATININE 0.81 07/13/2013 0430   CALCIUM 8.1* 07/15/2013 0400   CALCIUM 8.2* 07/14/2013 0425   CALCIUM 7.5* 07/13/2013 0430   GFRNONAA >90 07/15/2013 0400   GFRNONAA >90 07/14/2013 0425   GFRNONAA >90 07/13/2013 0430   GFRAA >90 07/15/2013 0400   GFRAA >90 07/14/2013 0425   GFRAA >90 07/13/2013 0430   CBC    Component Value Date/Time   WBC 14.6* 07/15/2013 0400   RBC 2.98* 07/15/2013 0400   HGB 9.5* 07/15/2013 0400   HCT 29.8* 07/15/2013 0400   PLT 276 07/15/2013 0400   MCV 100.0 07/15/2013 0400   MCH 31.9 07/15/2013 0400   MCHC 31.9 07/15/2013 0400   RDW 14.5 07/15/2013 0400   LYMPHSABS 1.9 07/15/2013 0400   MONOABS 1.3* 07/15/2013 0400   EOSABS 0.0 07/15/2013 0400   BASOSABS 0.0 07/15/2013 0400   HEPATIC Function Panel  Recent Labs  07/10/13 0400 07/11/13 0445 07/13/13 0430  PROT 5.4* 5.2* 4.9*   HEMOGLOBIN A1C No components found with this basename: HGA1C,  MPG   CARDIAC ENZYMES Lab Results  Component Value Date   CKTOTAL 128 10/19/2008   CKMB 2.1 10/19/2008   TROPONINI 3.04* 07/11/2013   TROPONINI 7.11* 07/08/2013   TROPONINI >20.00* 07/07/2013   BNP  Recent Labs  02/01/13 1221 06/27/13 1933  PROBNP 192.7* 146.2*   TSH  Recent Labs  07/09/13 1211  TSH 0.261*   CHOLESTEROL No results found for this basename: CHOL,  in the last 8760 hours  Scheduled Meds: . albuterol  3 mL Inhalation Q6H  . ampicillin-sulbactam (UNASYN) IV  3 g Intravenous Q6H  . antiseptic oral rinse  15 mL Mouth Rinse QID  . aspirin  81 mg Oral Daily  . atorvastatin  80 mg Oral q1800  . chlorhexidine  15 mL Mouth Rinse BID  . feeding supplement (PRO-STAT SUGAR FREE 64)  30 mL Per Tube BID  . folic acid  1 mg Per Tube Daily  . free water  200 mL Per Tube Q4H  . heparin subcutaneous  5,000 Units Subcutaneous 3 times per day  . hydrALAZINE  25 mg Oral 3 times per day  . hydrocortisone sodium succinate  25 mg Intravenous Q12H  . insulin aspart  2-6 Units Subcutaneous 6 times per day  .  levETIRAcetam (KEPPRA) IVPB  500 mg Intravenous Q12H  . metoprolol tartrate  25 mg Oral BID  . pantoprazole (PROTONIX) IV  40 mg Intravenous QHS  . pneumococcal 23 valent vaccine  0.5 mL Intramuscular Tomorrow-1000  . potassium chloride  30 mEq Per Tube Q4H  . [START ON 07/16/2013] thiamine  100 mg Per Tube Daily  . ticagrelor  90 mg Oral BID   Continuous Infusions: . sodium chloride 20 mL/hr at 07/14/13 1523  . feeding supplement (VITAL 1.5 CAL) 1,000 mL (07/15/13 0145)  . fentaNYL infusion INTRAVENOUS Stopped (07/13/13 0730)  . propofol Stopped (07/13/13 0730)   PRN Meds:.acetaminophen (TYLENOL) oral liquid 160 mg/5 mL, artificial tears, fentaNYL, ibuprofen, labetalol, midazolam  Assessment/Plan: Acute inferior wall myocardial infarction status post PTCA stenting to 100% occluded RCA  Status post V. 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 Add Potassium supplement.   LOS: 9 days    Dixie Dials  MD  07/15/2013, 7:57 AM

## 2013-07-15 NOTE — Progress Notes (Signed)
[facility] ADULT ICU REPLACEMENT PROTOCOL FOR AM LAB REPLACEMENT ONLY  The patient does apply for the [facility] Adult ICU Electrolyte Replacment Protocol based on the criteria listed below:   1. Is GFR >/= 40 ml/min? Yes.    Patient's GFR today is 20 2. Is urine output >/= 0.5 ml/kg/hr for the last 6 hours? Yes.   Patient's UOP is 2.3 ml/kg/hr 3. Is BUN < 60 mg/dL? Yes.    Patient's BUN today is 20 4. Abnormal electrolyte(s): K+3.2 5. Ordered repletion with: protocol 6. If a panic level lab has been reported, has the CCM MD in charge been notified? Yes.  .   Physician:  E Deterding  Concepcion Living [facility] 07/15/2013 5:34 AM

## 2013-07-15 NOTE — Progress Notes (Addendum)
PULMONARY / CRITICAL CARE MEDICINE  Name: Chase Blackwell MRN: [id number] DOB: [birth year] PCP Default, Provider, MD   ADMISSION DATE:  July 22, 2013  PRIMARY SERVICE: PCCM  CHIEF COMPLAINT:  VF Arrest -> Inferior STEMI (Cooling Protocol)  CARDIOLOGY: Harwani/Kadakia  BRIEF PATIENT DESCRIPTION:  Patient is a 48 year old male with PMH of asthma, HTN, EtOH abuse, chronic pain, schizoaffective d/o who presented to ER with VF arrest (down 15 mins) and inferior STEMI on 2022/07/23. Cooling protocol started and taken to cath lab for PCI with RCA stent placement.  7/5 Patient rewarmed, began having fine seizure-like activity. Treated with versed, neuro consulted.   LINES / TUBES: ETT 07-23-22 >>> R IJ CVL 7/4 occluded>>>7/9 7/9 left IJ>>> L Rad art line 7/4 >>>7/10  CULTURES: Urine 07/23/22 >>> neg MRSA by PCR 07/23/22 >>> neg --------------------------------- 7/6 resp>>>Klieb pan sens 7/6 BC x 2 >>>coag neg staph  ANTIBIOTICS: Unasyn 7/6 >>> Vanc 7/7 >>>7/8  SIGNIFICANT EVENTS / STUDIES:  07/23/22 Intubated in field 2022-07-23 Taken to cath lab RCA occluded. Left system ok. S/p PCI 07/23/2022 Hypothermia protocol initiated 7/5 CT Head >>> no acute intracranial abnormality 7/5 EEG >>> activity marred by artifact but minimal 7/6 Rewarmed, normothermic 7/6 Continuous EEG stopped, myoclonic jerks remain 7/7- remains on sedation , tremors improved 7/8 MRI brain>>>neg acute, sinusitis 7/9- pause cardiac, reduced sedation, neg 1.6 liters 7/10- resolving rt hilar infiltrates  SUBJECTIVE:    07/15/13: Concern ET tube too small. Not on pressors. Only on fent prn - last dose 6am: opens eyes . RN denies seizures/myoclonus  VITAL SIGNS: Filed Vitals:   07/15/13 0600 07/15/13 0700 07/15/13 0758 07/15/13 0800  BP:  157/58 157/58 171/73  Pulse: 100 73  81  Temp: 101.5 F (38.6 C) 100.9 F (38.3 C)  101.1 F (38.4 C)  TempSrc: Core (Comment) Core (Comment)    Resp: 22 21  17   Height:      Weight:      SpO2: 98% 94%   96%   VENTILATOR SETTINGS: Vent Mode:  [-] PRVC FiO2 (%):  [30 %-50 %] 30 % Set Rate:  [18 bmp] 18 bmp Vt Set:  [600 mL] 600 mL PEEP:  [5 cmH20] 5 cmH20 Pressure Support:  [12 cmH20] 12 cmH20 Plateau Pressure:  [15 cmH20-17 cmH20] 16 cmH20  INTAKE / OUTPUT: I/O last 3 completed shifts: In: 42 [I.V.:680; ON/GE:9528; IV [id number]:2725] Out: 3664 [Urine:5775]   PHYSICAL EXAMINATION:  Gen: eyes open, on vent HEENT: PERRL, ETT  PULM: crackles R base, otherwise clear CV: RRR, no mgr AB: BS+ soft nontender Ext: warm no edema Neuro: Only on fent prn - last dose 6am: opens eyes but blink to threat +, gag +, pupis react +, corneal +, downgoing babinski +, motor tone + per neuro. RN denies seizures/myoclonus     LABS: PULMONARY  Recent Labs Lab 07/10/13 0438 07/10/13 1221 07/11/13 0355 07/12/13 0925 07/12/13 1215  PHART 7.248* 7.344* 7.346* 7.568* 7.434  PCO2ART 54.8* 43.1 50.3* 31.1* 49.1*  PO2ART 74.3* 57.0* 110.0* 64.4* 71.4*  HCO3 23.1 23.5 26.8* 28.4* 32.4*  TCO2 24.8 25 28.3 29.4 33.9  O2SAT 96.1 87.0 99.6 94.9 93.9    CBC  Recent Labs Lab 07/13/13 0430 07/14/13 0425 07/15/13 0400  HGB 8.2* 8.9* 9.5*  HCT 24.8* 27.5* 29.8*  WBC 8.6 12.1* 14.6*  PLT 189 227 276    COAGULATION  Recent Labs Lab 07/13/13 0431  INR 0.98    CARDIAC    Recent Labs Lab 07/11/13 0500  TROPONINI  3.04*   No results found for this basename: PROBNP,  in the last 168 hours   CHEMISTRY  Recent Labs Lab 07/11/13 0445 07/12/13 0425 07/13/13 0430 07/14/13 0425 07/15/13 0400  NA 138 145 152* 156* 150*  K 4.4 3.1* 3.0* 3.4* 3.2*  CL 102 104 111 113* 106  CO2 25 29 30  32 29  GLUCOSE 120* 184* 155* 170* 137*  BUN 10 16 20  25* 20  CREATININE 0.60 0.83 0.81 0.92 0.82  CALCIUM 8.8 8.4 7.5* 8.2* 8.1*   Estimated Creatinine Clearance: 118.6 ml/min (by C-G formula based on Cr of 0.82).   LIVER  Recent Labs Lab 07/10/13 0400 07/11/13 0445 07/13/13 0430  07/13/13 0431  AST 110* 69* 52*  --   ALT 47 44 46  --   ALKPHOS 60 229* 171*  --   BILITOT 0.5 0.5 0.3  --   PROT 5.4* 5.2* 4.9*  --   ALBUMIN 2.2* 1.9* 1.8*  --   INR  --   --   --  0.98     INFECTIOUS No results found for this basename: LATICACIDVEN, PROCALCITON,  in the last 168 hours   ENDOCRINE CBG (last 3)   Recent Labs  07/14/13 2050 07/14/13 2338 07/15/13 0505  GLUCAP 143* 179* 148*    IMAGING x48h  7/11-  RLL infiltrate  ASSESSMENT / PLAN:  PULMONARY A:  Acute respiratory failure - secondary to acute MI; failed SBT on 7/11  Hx of asthma Airway obstruction HCAP   - does not meet sbt criteria  P:   - daily SBT/WUA - BDs  - did well with lasix, maintain  CARDIOVASCULAR A:  Ventricular fibrillation arrest Acute inferior STEMI due to occluded RCA - - S/p PCI of RCA per cardiology  Hx HTN  P:  - DAPT, ASA, Statin - wean stress steroids  - reduce to  25mg  IV q24h on 07/15/13)   RENAL A: hypokalemia P: - chem in am  - k supp - increase free water  INFECTIOUS A: Aspiration pneumonia P: - Unasyn end date in place  GASTROINTESTINAL A:   Stress ulcer prophylaxis Hx GERD P:   - PPI - TF  HEMATOLOGIC A: VTE PPx P: - SQ heparin restart no bleeding further line - Brilinta may need to hold if bleedin noted  ENDOCRINE A: Hypoglycemia - r/o underlying liver dz, improved with TF - resolved R/o sick euthyroid (likely) P: -treat underlying illness -cbg   NEUROLOGIC A: S/p Cardiac Arrest - CPR 15 mins, anoxic brain injury Hx ETOH abuse Hx Schizophrenia Rigid - on haldol at home = at risk NMS R/o ETOH WD component     - normal MRI, Having brain reflexes like pupil, gag, motor, blink,  Corneals. Per Neuro more awake. Still likely neuro outcome is one of significant disability   P: -dc  ativan  - Continue Keppra - f/u neurology recs - fent prn     GLOBAL  7?12/15: per earlier charting: Family considering withdrawal of  support on Monday July 16, 2013 given overall prognosis + baseline psych issues. So will not change ET tube    The patient is critically ill with multiple organ systems failure and requires high complexity decision making for assessment and support, frequent evaluation and titration of therapies, application of advanced monitoring technologies and extensive interpretation of multiple databases.   Critical Care Time devoted to patient care services described in this note is  35  Minutes.  Dr. Brand Males, M.D., [facility]  Pulmonary and Critical Care Medicine Staff Physician [place] [facility] Pager: [phone], If no answer or between  15:00h - 7:00h: call [phone]  07/15/2013 9:55 AM

## 2013-07-16 ENCOUNTER — Inpatient Hospital Stay (HOSPITAL_COMMUNITY): Payer: Medicare Other

## 2013-07-16 LAB — CBC WITH DIFFERENTIAL/PLATELET
BASOS PCT: 0 % (ref 0–1)
Basophils Absolute: 0 10*3/uL (ref 0.0–0.1)
Eosinophils Absolute: 0.3 10*3/uL (ref 0.0–0.7)
Eosinophils Relative: 2 % (ref 0–5)
HCT: 30.6 % — ABNORMAL LOW (ref 39.0–52.0)
HEMOGLOBIN: 9.9 g/dL — AB (ref 13.0–17.0)
Lymphocytes Relative: 14 % (ref 12–46)
Lymphs Abs: 2.1 10*3/uL (ref 0.7–4.0)
MCH: 32.1 pg (ref 26.0–34.0)
MCHC: 32.4 g/dL (ref 30.0–36.0)
MCV: 99.4 fL (ref 78.0–100.0)
MONO ABS: 0.9 10*3/uL (ref 0.1–1.0)
Monocytes Relative: 6 % (ref 3–12)
NEUTROS PCT: 78 % — AB (ref 43–77)
Neutro Abs: 11.9 10*3/uL — ABNORMAL HIGH (ref 1.7–7.7)
PLATELETS: 340 10*3/uL (ref 150–400)
RBC: 3.08 MIL/uL — ABNORMAL LOW (ref 4.22–5.81)
RDW: 14.5 % (ref 11.5–15.5)
WBC: 15.2 10*3/uL — ABNORMAL HIGH (ref 4.0–10.5)

## 2013-07-16 LAB — GLUCOSE, CAPILLARY
GLUCOSE-CAPILLARY: 137 mg/dL — AB (ref 70–99)
GLUCOSE-CAPILLARY: 153 mg/dL — AB (ref 70–99)
Glucose-Capillary: 114 mg/dL — ABNORMAL HIGH (ref 70–99)
Glucose-Capillary: 127 mg/dL — ABNORMAL HIGH (ref 70–99)
Glucose-Capillary: 132 mg/dL — ABNORMAL HIGH (ref 70–99)
Glucose-Capillary: 144 mg/dL — ABNORMAL HIGH (ref 70–99)
Glucose-Capillary: 159 mg/dL — ABNORMAL HIGH (ref 70–99)

## 2013-07-16 LAB — BASIC METABOLIC PANEL
Anion gap: 13 (ref 5–15)
BUN: 18 mg/dL (ref 6–23)
CALCIUM: 8 mg/dL — AB (ref 8.4–10.5)
CO2: 25 mEq/L (ref 19–32)
Chloride: 107 mEq/L (ref 96–112)
Creatinine, Ser: 0.66 mg/dL (ref 0.50–1.35)
GFR calc Af Amer: >90 mL/min (ref >90)
GFR calc non Af Amer: >90 mL/min (ref >90)
GLUCOSE: 150 mg/dL — AB (ref 70–99)
Potassium: 3.6 mEq/L — ABNORMAL LOW (ref 3.7–5.3)
Sodium: 145 mEq/L (ref 137–147)

## 2013-07-16 LAB — MAGNESIUM: Magnesium: 2.3 mg/dL (ref 1.5–2.5)

## 2013-07-16 LAB — PHOSPHORUS: Phosphorus: 3.9 mg/dL (ref 2.3–4.6)

## 2013-07-16 MED ORDER — LORAZEPAM 2 MG/ML IJ SOLN
INTRAMUSCULAR | Status: AC
  Administered 2013-07-16: 4 mg via INTRAVENOUS
  Filled 2013-07-16: qty 2

## 2013-07-16 MED ORDER — LORAZEPAM 2 MG/ML IJ SOLN
2.0000 mg | INTRAMUSCULAR | Status: AC
  Filled 2013-07-16: qty 1

## 2013-07-16 MED ORDER — DIVALPROEX SODIUM 125 MG PO CPSP
500.0000 mg | ORAL_CAPSULE | Freq: Three times a day (TID) | ORAL | Status: DC
  Administered 2013-07-16 – 2013-07-21 (×15): 500 mg via ORAL
  Filled 2013-07-16 (×18): qty 4

## 2013-07-16 MED ORDER — MIDAZOLAM HCL 5 MG/ML IJ SOLN: 1.0000 mg/h | INTRAMUSCULAR | Status: DC

## 2013-07-16 MED ORDER — LORAZEPAM 2 MG/ML IJ SOLN
INTRAMUSCULAR | Status: AC
  Filled 2013-07-16: qty 1

## 2013-07-16 MED ORDER — HYDROCORTISONE NA SUCCINATE PF 100 MG IJ SOLR
25.0000 mg | Freq: Every day | INTRAMUSCULAR | Status: AC
  Administered 2013-07-16: 25 mg via INTRAVENOUS
  Filled 2013-07-16: qty 0.5

## 2013-07-16 MED ORDER — POTASSIUM CHLORIDE 20 MEQ/15ML (10%) PO LIQD
20.0000 meq | ORAL | Status: AC
  Administered 2013-07-16 (×2): 20 meq
  Filled 2013-07-16: qty 15

## 2013-07-16 MED ORDER — VALPROATE SODIUM 500 MG/5ML IV SOLN
2000.0000 mg | INTRAVENOUS | Status: AC
  Administered 2013-07-16: 2000 mg via INTRAVENOUS
  Filled 2013-07-16: qty 20

## 2013-07-16 MED ORDER — LORAZEPAM 2 MG/ML IJ SOLN
4.0000 mg | Freq: Once | INTRAMUSCULAR | Status: AC
  Administered 2013-07-16: 4 mg via INTRAVENOUS
  Filled 2013-07-16: qty 2

## 2013-07-16 MED ORDER — LORAZEPAM 2 MG/ML IJ SOLN
4.0000 mg | INTRAMUSCULAR | Status: AC
  Administered 2013-07-16: 4 mg via INTRAVENOUS

## 2013-07-16 MED ORDER — LORAZEPAM 2 MG/ML IJ SOLN
2.0000 mg | Freq: Once | INTRAMUSCULAR | Status: AC
  Administered 2013-07-16: 2 mg via INTRAVENOUS

## 2013-07-16 MED ORDER — SODIUM CHLORIDE 0.9 % IV SOLN
1000.0000 mg | Freq: Two times a day (BID) | INTRAVENOUS | Status: DC
  Administered 2013-07-16 – 2013-07-23 (×15): 1000 mg via INTRAVENOUS
  Filled 2013-07-16 (×16): qty 10

## 2013-07-16 NOTE — Progress Notes (Signed)
Subjective: Had seizure this morning.   Exam: Filed Vitals:   07/16/13 0800  BP: 176/91  Pulse: 93  Temp: 99.3 F (37.4 C)  Resp: 31   Gen: In bed, NAD MS: opens eyes spontaneously. Does not follow commands.  KZ:LDJTT,  Does not fixate or track, does not blink to threat. Does appear to move eyes to the right briefly.  Motor: flexion to noxious stimuli.  Sensory:as above.  DTR:3+ on left, 2+ on right.   Impression: 48 yo M with persistent mental status changes following cardiac arrest. MRI does not show changes, but it can be negative even in the setting of hypoxic injury. He continues to be densely encephalopathic. Despite his eye opening, I still think that he has likely had a degree of anoxic brain injury.   He had continuous EEG previously that did not show ongoing seizure, but with sieuzre this AM, would repeat EEG.   Recommendations: 1) EEG today 2) increase keppra to 1gm BID.  3) will continue to follow.   Roland Rack, MD [facility] [phone]  If 7pm- 7am, please page neurology on call as listed in [place].

## 2013-07-16 NOTE — Progress Notes (Signed)
[facility] ADULT ICU REPLACEMENT PROTOCOL FOR AM LAB REPLACEMENT ONLY  The patient does apply for the [facility] Adult ICU Electrolyte Replacment Protocol based on the criteria listed below:   1. Is GFR >/= 40 ml/min? Yes.    Patient's GFR today is >90 2. Is urine output >/= 0.5 ml/kg/hr for the last 6 hours? Yes.   Patient's UOP is 1.6/kg/hr 3. Is BUN < 60 mg/dL? Yes.    Patient's BUN today is 18.  Abnormal electrolyte(s K+3.6 5. Ordered repletion with: protocol 6. If a panic level lab has been reported, has the CCM MD in charge been notified? Yes.  .   Physician:  Marni Griffon 07/16/2013 5:39 AM

## 2013-07-16 NOTE — Progress Notes (Signed)
Subjective:  Patient remains intubated sedated unresponsive had seizure activity early this morning. Now getting EEG continuously. No active cardiac issues at this point  Objective:  Vital Signs in the last 24 hours: Temp:  [99.3 F (37.4 C)-100.6 F (38.1 C)] 99.3 F (37.4 C) (07/13 0800) Pulse Rate:  [61-93] 80 (07/13 1041) Resp:  [10-31] 31 (07/13 0800) BP: (139-176)/(67-91) 141/78 mmHg (07/13 1041) SpO2:  [93 %-97 %] 94 % (07/13 0800) FiO2 (%):  [30 %] 30 % (07/13 0823) Weight:  [85.9 kg (189 lb 6 oz)] 85.9 kg (189 lb 6 oz) (07/13 0423)  Intake/Output from previous day: 07/12 0701 - 07/13 0700 In: 3715 [I.V.:420; NG/GT:2690; IV Piggyback:605] Out: 0539 [Urine:4755] Intake/Output from this shift: Total I/O In: 255 [I.V.:60; NG/GT:195] Out: 325 [Urine:325]  Physical Exam: Neck: no adenopathy, no carotid bruit, no JVD and supple, symmetrical, trachea midline Lungs: Decreased breath sound at bases with occasional rhonchi  Heart: regular rate and rhythm, S1, S2 normal and Soft systolic murmur noted no S3 gallop Abdomen: Soft bowel sounds present Extremities: extremities normal, atraumatic, no cyanosis or edema  Lab Results:  Recent Labs  07/15/13 0400 07/16/13 0400  WBC 14.6* 15.2*  HGB 9.5* 9.9*  PLT 276 340    Recent Labs  07/15/13 0400 07/16/13 0400  NA 150* 145  K 3.2* 3.6*  CL 106 107  CO2 29 25  GLUCOSE 137* 150*  BUN 20 18  CREATININE 0.82 0.66   No results found for this basename: TROPONINI, CK, MB,  in the last 72 hours Hepatic Function Panel No results found for this basename: PROT, ALBUMIN, AST, ALT, ALKPHOS, BILITOT, BILIDIR, IBILI,  in the last 72 hours No results found for this basename: CHOL,  in the last 72 hours No results found for this basename: PROTIME,  in the last 72 hours  Imaging: Imaging results have been reviewed and Dg Chest Port 1 View  07/16/2013   CLINICAL DATA:  Acute respiratory failure.  EXAM: PORTABLE CHEST - 1 VIEW   COMPARISON:  07/15/2013  FINDINGS: Endotracheal tube is at the thoracic inlet. NG tube tip is in the upper body of the stomach. Central venous catheter tip is in the superior vena cava at the level of the carina.  Heart size and pulmonary vascularity are normal. There is slightly improved focal atelectasis in the right lower lobe medially. The lungs are otherwise clear.  IMPRESSION: Slightly improved atelectasis/ infiltrate in the right lower lobe.   Electronically Signed   By: Rozetta Nunnery M.D.   On: 07/16/2013 07:07   Dg Chest Port 1 View  07/15/2013   CLINICAL DATA:  Endotracheal tube positioning  EXAM: PORTABLE CHEST - 1 VIEW  COMPARISON:  07/13/2013; 07/12/2013; 07/11/2013  FINDINGS: Grossly unchanged cardiac silhouette and mediastinal contours. Stable position of support apparatus. No pneumothorax. Overall improved aeration of lungs with persistent heterogeneous airspace opacities the right middle lobe. No new focal airspace opacities. No evidence of edema. No pleural effusion. Unchanged bones.  IMPRESSION: 1.  Stable positioning of support apparatus.  No pneumothorax. 2. Improved aeration of lungs suggests resolving perihilar atelectasis and/or asymmetric edema. 3. Residual heterogeneous airspace opacities within the right middle lobe, possibly atelectasis though infection and/or aspiration could have a similar appearance. Continued attention on follow-up is recommended.   Electronically Signed   By: Sandi Mariscal M.D.   On: 07/15/2013 13:40    Cardiac Studies:  Assessment/Plan:   Acute inferior wall myocardial infarction status post PTCA stenting to 100%  occluded RCA  Status post V. 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

## 2013-07-16 NOTE — Progress Notes (Signed)
PULMONARY / CRITICAL CARE MEDICINE  Name: Chase Blackwell MRN: [id number] DOB: [birth year] PCP Default, Provider, MD   ADMISSION DATE:  Aug 03, 2013  PRIMARY SERVICE: PCCM  CHIEF COMPLAINT:  VF Arrest -> Inferior STEMI (Cooling Protocol)  CARDIOLOGY: Harwani/Kadakia  BRIEF PATIENT DESCRIPTION:  Patient is a 48 year old male with PMH of asthma, HTN, EtOH abuse, chronic pain, schizoaffective d/o who presented to ER with VF arrest (down 15 mins) and inferior STEMI on 08/04/22. Cooling protocol started and taken to cath lab for PCI with RCA stent placement.  7/5 Patient rewarmed, began having fine seizure-like activity. Treated with versed, neuro consulted.   LINES / TUBES: ETT August 04, 2022 >>> R IJ CVL 7/4 occluded>>>7/9 7/9 left IJ>>> L Rad art line 7/4 >>>7/10  CULTURES: Urine 08/04/22 >>> neg MRSA by PCR 2022-08-04 >>> neg --------------------------------- 7/6 resp>>>Klieb pan sens 7/6 BC x 2 >>>coag neg staph 1/2  ANTIBIOTICS: Unasyn 7/6 >>> Vanc 7/7 >>>7/8  SIGNIFICANT EVENTS / STUDIES:  08/04/22 Intubated in field 04-Aug-2022 Taken to cath lab RCA occluded. Left system ok. S/p PCI 08/04/2022 Hypothermia protocol initiated 7/5 CT Head >>> no acute intracranial abnormality 7/5 EEG >>> activity marred by artifact but minimal 7/6 Rewarmed, normothermic 7/6 Continuous EEG stopped, myoclonic jerks remain 7/7- remains on sedation , tremors improved 7/8 MRI brain>>>neg acute, sinusitis 7/9- pause cardiac, reduced sedation, neg 1.6 liters 7/10- resolving rt hilar infiltrates 7/13- seizure noted  SUBJECTIVE:   seizure this am   VITAL SIGNS: Filed Vitals:   07/16/13 0429 07/16/13 0500 07/16/13 0700 07/16/13 0721  BP: 147/74 166/83 141/69 154/80  Pulse: 63 72 63 63  Temp: 99.7 F (37.6 C) 100 F (37.8 C) 99.3 F (37.4 C)   TempSrc:  Core (Comment)  Core (Comment)  Resp: 24 24 23 24   Height:      Weight:      SpO2: 95% 95% 96% 97%   VENTILATOR SETTINGS: Vent Mode:  [-] PRVC FiO2 (%):  [30 %] 30 % Set  Rate:  [18 bmp] 18 bmp Vt Set:  [600 mL] 600 mL PEEP:  [5 cmH20] 5 cmH20 Plateau Pressure:  [10 cmH20-19 cmH20] 16 cmH20  INTAKE / OUTPUT: I/O last 3 completed shifts: In: 0814 [I.V.:680; GY/JE:5631; IV SHFWYOVZC:588] Out: 5027 [Urine:7655]   PHYSICAL EXAMINATION:  Gen: eyes open, on vent HEENT: PERRL 5 mm PULM: CTA CV: RRR, no mgr AB: BS+ soft nontender Ext: warm no edema Neuro: perrl 5 mm, sightly moves neck and arms to pain on knuckles, no localizing   LABS: PULMONARY  Recent Labs Lab 07/10/13 0438 07/10/13 1221 07/11/13 0355 07/12/13 0925 07/12/13 1215  PHART 7.248* 7.344* 7.346* 7.568* 7.434  PCO2ART 54.8* 43.1 50.3* 31.1* 49.1*  PO2ART 74.3* 57.0* 110.0* 64.4* 71.4*  HCO3 23.1 23.5 26.8* 28.4* 32.4*  TCO2 24.8 25 28.3 29.4 33.9  O2SAT 96.1 87.0 99.6 94.9 93.9    CBC  Recent Labs Lab 07/14/13 0425 07/15/13 0400 07/16/13 0400  HGB 8.9* 9.5* 9.9*  HCT 27.5* 29.8* 30.6*  WBC 12.1* 14.6* 15.2*  PLT 227 276 340    COAGULATION  Recent Labs Lab 07/13/13 0431  INR 0.98    CARDIAC    Recent Labs Lab 07/11/13 0500  TROPONINI 3.04*   No results found for this basename: PROBNP,  in the last 168 hours   CHEMISTRY  Recent Labs Lab 07/12/13 0425 07/13/13 0430 07/14/13 0425 07/15/13 0400 07/16/13 0400  NA 145 152* 156* 150* 145  K 3.1* 3.0* 3.4* 3.2* 3.6*  CL  104 111 113* 106 107  CO2 29 30 32 29 25  GLUCOSE 184* 155* 170* 137* 150*  BUN 16 20 25* 20 18  CREATININE 0.83 0.81 0.92 0.82 0.66  CALCIUM 8.4 7.5* 8.2* 8.1* 8.0*  MG  --   --   --   --  2.3  PHOS  --   --   --   --  3.9   Estimated Creatinine Clearance: 121.6 ml/min (by C-G formula based on Cr of 0.66).   LIVER  Recent Labs Lab 07/10/13 0400 07/11/13 0445 07/13/13 0430 07/13/13 0431  AST 110* 69* 52*  --   ALT 47 44 46  --   ALKPHOS 60 229* 171*  --   BILITOT 0.5 0.5 0.3  --   PROT 5.4* 5.2* 4.9*  --   ALBUMIN 2.2* 1.9* 1.8*  --   INR  --   --   --  0.98      INFECTIOUS No results found for this basename: LATICACIDVEN, PROCALCITON,  in the last 168 hours   ENDOCRINE CBG (last 3)   Recent Labs  07/16/13 0012 07/16/13 0323 07/16/13 0724  GLUCAP 153* 127* 144*    IMAGING x48h  7/11-  RLL infiltrate improving daily, ett wnl, line wnl  ASSESSMENT / PLAN:  PULMONARY A:  Acute respiratory failure - secondary to acute MI; failed SBT on 7/11  Hx of asthma Airway obstruction HCAP   - does not meet sbt criteria due to seziures  P:   - just had seizure, will hold spont breathing trials - BDs  - did well with lasix, maintain as tolerated -trach would be medically ineffective and not offer a quality of life  CARDIOVASCULAR A:  Ventricular fibrillation arrest Acute inferior STEMI due to occluded RCA - - S/p PCI of RCA per cardiology  Hx HTN  P:  - DAPT, ASA, Statin - wean stress steroids  - reduce to  25mg  to daily then dc  RENAL A: Hypokalemia resolved P: - chem in am   INFECTIOUS A: Aspiration pneumonia P: - Unasyn end date in place  GASTROINTESTINAL A:   Stress ulcer prophylaxis Hx GERD P:   - PPI - TF  HEMATOLOGIC A: VTE PPx Some hemoconcentration  P: - SQ heparin restart no bleeding further line - Brilinta   ENDOCRINE A: Hypoglycemia - r/o underlying liver dz, improved with TF - resolved R/o sick euthyroid (likely) P: -treat underlying illness -cbg   NEUROLOGIC A: S/p Cardiac Arrest - CPR 15 mins, anoxic brain injury Hx ETOH abuse Hx Schizophrenia Rigid - on haldol at home = at risk NMS R/o ETOH WD component seziure 7/13  P: -keppra increase per neuro - f/u neurology recs - fent prn -repeat eeg     GLOBAL  seziures today, d/w neuro, unlikley to have a fxnal recovery, will call brother and update sister at bedside, consider max 12 days vent then dc for hopes successs and NO trach, will call for dnr as well again Day 8 seziures - not a good sign    The patient is  critically ill with multiple organ systems failure and requires high complexity decision making for assessment and support, frequent evaluation and titration of therapies, application of advanced monitoring technologies and extensive interpretation of multiple databases.   Critical Care Time devoted to patient care services described in this note is  35  Minutes.  \Daniel J. Titus Mould, MD, [place] Pgr: [facility]

## 2013-07-16 NOTE — Progress Notes (Signed)
LTVM EEG started per Dr. Leonel Ramsay.

## 2013-07-17 LAB — CBC WITH DIFFERENTIAL/PLATELET
Basophils Absolute: 0 10*3/uL (ref 0.0–0.1)
Basophils Relative: 0 % (ref 0–1)
EOS PCT: 3 % (ref 0–5)
Eosinophils Absolute: 0.4 10*3/uL (ref 0.0–0.7)
HCT: 31.6 % — ABNORMAL LOW (ref 39.0–52.0)
Hemoglobin: 10.2 g/dL — ABNORMAL LOW (ref 13.0–17.0)
LYMPHS PCT: 13 % (ref 12–46)
Lymphs Abs: 1.8 10*3/uL (ref 0.7–4.0)
MCH: 31.9 pg (ref 26.0–34.0)
MCHC: 32.3 g/dL (ref 30.0–36.0)
MCV: 98.8 fL (ref 78.0–100.0)
MONOS PCT: 4 % (ref 3–12)
Monocytes Absolute: 0.6 10*3/uL (ref 0.1–1.0)
NEUTROS PCT: 80 % — AB (ref 43–77)
Neutro Abs: 11.2 10*3/uL — ABNORMAL HIGH (ref 1.7–7.7)
Platelets: 407 10*3/uL — ABNORMAL HIGH (ref 150–400)
RBC: 3.2 MIL/uL — AB (ref 4.22–5.81)
RDW: 14.6 % (ref 11.5–15.5)
WBC: 14 10*3/uL — ABNORMAL HIGH (ref 4.0–10.5)

## 2013-07-17 LAB — GLUCOSE, CAPILLARY
GLUCOSE-CAPILLARY: 117 mg/dL — AB (ref 70–99)
GLUCOSE-CAPILLARY: 142 mg/dL — AB (ref 70–99)
GLUCOSE-CAPILLARY: 120 mg/dL — AB (ref 70–99)
GLUCOSE-CAPILLARY: 141 mg/dL — AB (ref 70–99)
Glucose-Capillary: 122 mg/dL — ABNORMAL HIGH (ref 70–99)
Glucose-Capillary: 152 mg/dL — ABNORMAL HIGH (ref 70–99)

## 2013-07-17 LAB — BASIC METABOLIC PANEL
Anion gap: 16 — ABNORMAL HIGH (ref 5–15)
BUN: 20 mg/dL (ref 6–23)
CALCIUM: 8.1 mg/dL — AB (ref 8.4–10.5)
CO2: 23 mEq/L (ref 19–32)
Chloride: 107 mEq/L (ref 96–112)
Creatinine, Ser: 0.66 mg/dL (ref 0.50–1.35)
GFR calc Af Amer: >90 mL/min (ref >90)
GFR calc non Af Amer: >90 mL/min (ref >90)
GLUCOSE: 131 mg/dL — AB (ref 70–99)
POTASSIUM: 3.5 meq/L — AB (ref 3.7–5.3)
SODIUM: 146 meq/L (ref 137–147)

## 2013-07-17 LAB — PHOSPHORUS: PHOSPHORUS: 4.5 mg/dL (ref 2.3–4.6)

## 2013-07-17 LAB — MAGNESIUM: MAGNESIUM: 2.4 mg/dL (ref 1.5–2.5)

## 2013-07-17 MED ORDER — POTASSIUM CHLORIDE 20 MEQ/15ML (10%) PO LIQD
40.0000 meq | Freq: Three times a day (TID) | ORAL | Status: AC
  Administered 2013-07-17 (×2): 40 meq
  Filled 2013-07-17 (×2): qty 30

## 2013-07-17 MED ORDER — PRO-STAT SUGAR FREE PO LIQD
30.0000 mL | Freq: Every day | ORAL | Status: DC
  Administered 2013-07-18: 09:00:00
  Administered 2013-07-19: 30 mL
  Filled 2013-07-17 (×2): qty 30

## 2013-07-17 MED ORDER — LORAZEPAM 2 MG/ML IJ SOLN
INTRAMUSCULAR | Status: AC
  Administered 2013-07-17: 2 mg via INTRAVENOUS
  Filled 2013-07-17: qty 1

## 2013-07-17 MED ORDER — FUROSEMIDE 10 MG/ML IJ SOLN
20.0000 mg | Freq: Three times a day (TID) | INTRAMUSCULAR | Status: AC
  Administered 2013-07-17 (×2): 20 mg via INTRAVENOUS
  Filled 2013-07-17: qty 2

## 2013-07-17 MED ORDER — LORAZEPAM 2 MG/ML IJ SOLN
2.0000 mg | Freq: Once | INTRAMUSCULAR | Status: AC
  Administered 2013-07-17: 2 mg via INTRAVENOUS

## 2013-07-17 MED ORDER — POTASSIUM CHLORIDE 20 MEQ/15ML (10%) PO LIQD
30.0000 meq | ORAL | Status: AC
  Administered 2013-07-17 (×2): 30 meq
  Filled 2013-07-17 (×2): qty 30

## 2013-07-17 MED ORDER — FENTANYL CITRATE 0.05 MG/ML IJ SOLN
100.0000 ug | Freq: Once | INTRAMUSCULAR | Status: AC
  Administered 2013-07-17: 100 ug via INTRAVENOUS

## 2013-07-17 MED ORDER — PROPOFOL 10 MG/ML IV EMUL
5.0000 ug/kg/min | INTRAVENOUS | Status: DC
  Administered 2013-07-17: 25 ug/kg/min via INTRAVENOUS
  Administered 2013-07-17: 50 ug/kg/min via INTRAVENOUS
  Administered 2013-07-18: 30 ug/kg/min via INTRAVENOUS
  Administered 2013-07-18: 20 ug/kg/min via INTRAVENOUS
  Administered 2013-07-18: 40 ug/kg/min via INTRAVENOUS
  Administered 2013-07-19: 50 ug/kg/min via INTRAVENOUS
  Administered 2013-07-19: 30 ug/kg/min via INTRAVENOUS
  Administered 2013-07-19: 35 ug/kg/min via INTRAVENOUS
  Administered 2013-07-19: 45 ug/kg/min via INTRAVENOUS
  Filled 2013-07-17 (×8): qty 100

## 2013-07-17 MED ORDER — PROPOFOL 10 MG/ML IV EMUL
INTRAVENOUS | Status: AC
  Filled 2013-07-17: qty 100

## 2013-07-17 MED ORDER — PROPOFOL 10 MG/ML IV EMUL
5.0000 ug/kg/min | INTRAVENOUS | Status: DC
  Administered 2013-07-17: 50 ug/kg/min via INTRAVENOUS

## 2013-07-17 NOTE — Progress Notes (Signed)
LTM EEG D/C'd per Dr Kirkpatrick 

## 2013-07-17 NOTE — Progress Notes (Signed)
Subjective: Was trying to sit up in bed when I approached in the room.   Exam: Filed Vitals:   07/17/13 1501  BP:   Pulse: 93  Temp:   Resp: 30   Gen: In bed, NAD MS: opens eyes spontaneously. Does not follow commands.  DH:RCBUL, he does fixate and tracks this examiner(though with saccadic smooth pursuit) across midline in both directions. corneals intact.  Motor: he has apparent voluntary spontaneous movements of all extremities. He withdraws briskly to noxious stimuli.  Sensory:as above.  DTR:3+ on left, 2+ on right.   Impression: 48 yo M with persistent mental status changes following cardiac arrest. MRI does not show changes, but it can be negative even in the setting of hypoxic injury. He very likely had a degree of anoxic brain injury. He appears to be making slow improvements on a daily basis.  Continuous EEG again without seizure activity, but does have some frontal sharp activity. Given clinical seizure yesterday am, would continue keppra and depakote.   Given his conscious movements and tracking, and with daily improvements in his exam, I think that longer term support would likely   Recommendations: 1) would favor continued support given daily improvements 2) continue keppra 1gm BID 3) Continue depakote 500mg  TID 4) will continue to follow.   Roland Rack, MD [facility] [phone]  If 7pm- 7am, please page neurology on call as listed in [place].

## 2013-07-17 NOTE — Progress Notes (Signed)
[facility] ADULT ICU REPLACEMENT PROTOCOL FOR AM LAB REPLACEMENT ONLY  The patient does apply for the [facility], [facility] Adult ICU Electrolyte Replacment Protocol based on the criteria listed below:   1. Is GFR >/= 40 ml/min? Yes.    Patient's GFR today is >90 2. Is urine output >/= 0.5 ml/kg/hr for the last 6 hours? Yes.   Patient's UOP is 0.5 ml/kg/hr 3. Is BUN < 60 mg/dL? Yes.    Patient's BUN today is 20 4. Abnormal electrolyte(s): K+3.5 5. Ordered repletion with: protocol 6. If a panic level lab has been reported, has the CCM MD in charge been notified? Yes.  .   Physician:  E Deterding  [facility] 07/17/2013 4:37 AM

## 2013-07-17 NOTE — Progress Notes (Signed)
When patient has any stimulation starts with body tremors. Dr Leonel Ramsay notified and ativan given.

## 2013-07-17 NOTE — Care Management Note (Addendum)
    Page 1 of 2   07/23/2013     1:35:30 PM CARE MANAGEMENT NOTE 07/23/2013  Patient:  Chase Blackwell, Chase Blackwell   Account Number:  [id number]  Date Initiated:  07/16/2013  Documentation initiated by:  Elissa Hefty  Subjective/Objective Assessment:   adm w stemi     Action/Plan:   lives at home   Anticipated DC Date:     Anticipated DC Plan:  LONG TERM ACUTE CARE (LTAC)  In-house referral  Clinical Social Worker      DC Forensic scientist  CM consult      Choice offered to / List presented to:             Status of service:   Medicare Important Message given?  YES (If response is "NO", the following Medicare IM given date fields will be blank) Date Medicare IM given:  07/18/2013 Medicare IM given by:  Elissa Hefty Date Additional Medicare IM given:  07/23/2013 Additional Medicare IM given by:  [facility] [place]  Discharge Disposition:  LONG TERM ACUTE CARE (LTAC)  Per UR Regulation:  Reviewed for med. necessity/level of care/duration of stay  If discussed at [place] of Stay Meetings, dates discussed:   07/17/2013  07/19/2013  07/24/2013    Comments:  brother donnie Fatula [phone] 7/20 1334 debbie Cletus [place] rn,bsn spoke w brother donnie and he's agreeable to pt dc to [facility]. [facility] has bed and will admit to ltac this afternoon.  07-20-13 2pm  Luz Lex, [place] Brother Donnie, in room.  Getting ready to trach patient. Talked with brother about progression and rehab options. Would like to go to an [facility] - prefers to go [facility] to [facility].  Talked with physician - feels will need [facility], but due to ileus willl be next week.  CM will continue to follow.  7/14 1047a debbie Keylah Darwish rn,bsn fam to meet w md today. brother has questions about county help w burial. sw ref has been made. will follow and await meeting and assist. spoke w brother donnie to let him know sw and cm will assist.

## 2013-07-17 NOTE — Progress Notes (Signed)
Subjective:  Patient remains intubated and  sedated. No significant cardiac arrhythmias on the monitor  Objective:  Vital Signs in the last 24 hours: Temp:  [98.4 F (36.9 C)-99.7 F (37.6 C)] 99 F (37.2 C) (07/14 0818) Pulse Rate:  [68-93] 93 (07/14 0818) Resp:  [18-36] 24 (07/14 0818) BP: (124-150)/(58-108) 141/81 mmHg (07/14 0818) SpO2:  [92 %-99 %] 99 % (07/14 0818) FiO2 (%):  [30 %-50 %] 50 % (07/14 0818) Weight:  [80.8 kg (178 lb 2.1 oz)] 80.8 kg (178 lb 2.1 oz) (07/14 0312)  Intake/Output from previous day: 07/13 0701 - 07/14 0700 In: 1975 [I.V.:300; NG/GT:1365; IV Piggyback:310] Out: 5366 [Urine:1550] Intake/Output from this shift: Total I/O In: -  Out: 300 [Urine:300]  Physical Exam: Unchanged  Lab Results:  Recent Labs  07/16/13 0400 07/17/13 0345  WBC 15.2* 14.0*  HGB 9.9* 10.2*  PLT 340 407*    Recent Labs  07/16/13 0400 07/17/13 0345  NA 145 146  K 3.6* 3.5*  CL 107 107  CO2 25 23  GLUCOSE 150* 131*  BUN 18 20  CREATININE 0.66 0.66   No results found for this basename: TROPONINI, CK, MB,  in the last 72 hours Hepatic Function Panel No results found for this basename: PROT, ALBUMIN, AST, ALT, ALKPHOS, BILITOT, BILIDIR, IBILI,  in the last 72 hours No results found for this basename: CHOL,  in the last 72 hours No results found for this basename: PROTIME,  in the last 72 hours  Imaging: Imaging results have been reviewed and Dg Chest Port 1 View  07/16/2013   CLINICAL DATA:  Acute respiratory failure.  EXAM: PORTABLE CHEST - 1 VIEW  COMPARISON:  07/15/2013  FINDINGS: Endotracheal tube is at the thoracic inlet. NG tube tip is in the upper body of the stomach. Central venous catheter tip is in the superior vena cava at the level of the carina.  Heart size and pulmonary vascularity are normal. There is slightly improved focal atelectasis in the right lower lobe medially. The lungs are otherwise clear.  IMPRESSION: Slightly improved atelectasis/  infiltrate in the right lower lobe.   Electronically Signed   By: Rozetta Nunnery M.D.   On: 07/16/2013 07:07    Cardiac Studies:  Assessment/Plan:  Acute inferior wall myocardial infarction status post PTCA stenting to 100% occluded RCA  Status post V. 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  LOS: 11 days    Zenith Kercheval N 07/17/2013, 10:40 AM

## 2013-07-17 NOTE — Procedures (Signed)
  Electroencephalogram report- LTM  Ordering Physician : Dr Leonel Ramsay EEG number: [phone] Data acquisition: 10-20 electrode placement. Additional T1, T2, and EKG electrodes; 26 channel digital referential acquisition reformatted to 18 channel/7 channel coronal bipolar  Spike detection: ON  Seizure detection: ON  Beginning time: 07/16/2013  Ending time: 07/17/2013    This 24 hours of intensive EEG monitoring with simultaneous video monitoring was performed for this patient with HTN, ETOH abuse, GERD, schizoaffective disorder who was found unresponsive, VF arrest, MI,  intubated  Medications: ativan PRN? Keppra , Depakote  There was no pushbutton activations events during this recording. Automated spike detection program did not detect any spikes the. Seizure detection program did not detect any seizures .    Waking background activities were marked by 3-4 cps broadly distributed delta slowing . Background EEG reactivity assesment was not performed including noxuous stimulay and passive EO. Sharp waves in the fronto-polar  channels are likely artifactual  Ativan administered around 11:01 am for unclear reason and mentioned on EEG tech report.    There was no clinical or subclinical seizures present.    Clinical interpretation: This 24 hours of intensive EEG monitoring with simultaneous monitoring did not record any clinical or subclinical seizures. Background activities were marked by generalized background activities delta slowing as discussed above. Reactivity was not tested. These finding c/w moderate to severe encephalopathy of nonspecific etiologies including toxic, metabolic or multifocal generative etiologies. There were no clinical or subclinical seizures present.  Clinical correlation is advised.

## 2013-07-17 NOTE — Progress Notes (Signed)
PULMONARY / CRITICAL CARE MEDICINE  Name: Chase Blackwell MRN: [id number] DOB: [birth year] PCP Default, Provider, MD   ADMISSION DATE:  July 12, 2013  PRIMARY SERVICE: PCCM  CHIEF COMPLAINT:  VF Arrest -> Inferior STEMI (Cooling Protocol)  CARDIOLOGY: Harwani/Kadakia  BRIEF PATIENT DESCRIPTION:  Patient is a 48 year old male with PMH of asthma, HTN, EtOH abuse, chronic pain, schizoaffective d/o who presented to ER with VF arrest (down 15 mins) and inferior STEMI on 07-13-2022. Cooling protocol started and taken to cath lab for PCI with RCA stent placement.  7/5 Patient rewarmed, began having fine seizure-like activity. Treated with versed, neuro consulted.   LINES / TUBES: ETT Jul 13, 2022 >>> R IJ CVL 7/4 occluded>>>7/9 7/9 left IJ>>> L Rad art line 7/4 >>>7/10  CULTURES: Urine 2022-07-13 >>> neg MRSA by PCR Jul 13, 2022 >>> neg --------------------------------- 7/6 resp>>>Klieb pan sens 7/6 BC x 2 >>>coag neg staph 1/2  ANTIBIOTICS: Unasyn 7/6 >>> Vanc 7/7 >>>7/8  SIGNIFICANT EVENTS / STUDIES:  2022-07-13 Intubated in field 2022-07-13 Taken to cath lab RCA occluded. Left system ok. S/p PCI 07/13/22 Hypothermia protocol initiated 7/5 CT Head >>> no acute intracranial abnormality 7/5 EEG >>> activity marred by artifact but minimal 7/6 Rewarmed, normothermic 7/6 Continuous EEG stopped, myoclonic jerks remain 7/7- remains on sedation , tremors improved 7/8 MRI brain>>>neg acute, sinusitis 7/9- pause cardiac, reduced sedation, neg 1.6 liters 7/10- resolving rt hilar infiltrates 7/13- seizure noted  SUBJECTIVE:   No seizure overnight, 24 hour EEG.  VITAL SIGNS: Filed Vitals:   07/17/13 0818 07/17/13 0900 07/17/13 1000 07/17/13 1138  BP: 141/81 134/56 118/51 123/67  Pulse: 93 91 164 77  Temp: 99 F (37.2 C) 98.6 F (37 C) 98.8 F (37.1 C) 99.1 F (37.3 C)  TempSrc: Core (Comment)   Core (Comment)  Resp: 24 25 27 24   Height:      Weight:      SpO2: 99% 96% 96% 97%   VENTILATOR SETTINGS: Vent Mode:  [-]  PRVC FiO2 (%):  [30 %-50 %] 50 % Set Rate:  [18 bmp] 18 bmp Vt Set:  [600 mL] 600 mL PEEP:  [5 cmH20] 5 cmH20 Plateau Pressure:  [15 cmH20-20 cmH20] 20 cmH20  INTAKE / OUTPUT: I/O last 3 completed shifts: In: 3875 [I.V.:520; NG/GT:2740; IV [id number]:532] Out: 9924 [Urine:3475]   PHYSICAL EXAMINATION:  Gen: eyes open, on vent HEENT: PERRL 5 mm PULM: CTA CV: RRR, no mgr AB: BS+ soft nontender Ext: warm no edema Neuro: perrl 5 mm, sightly moves neck and arms to pain on knuckles, no localizing  LABS: PULMONARY  Recent Labs Lab 07/10/13 1221 07/11/13 0355 07/12/13 0925 07/12/13 1215  PHART 7.344* 7.346* 7.568* 7.434  PCO2ART 43.1 50.3* 31.1* 49.1*  PO2ART 57.0* 110.0* 64.4* 71.4*  HCO3 23.5 26.8* 28.4* 32.4*  TCO2 25 28.3 29.4 33.9  O2SAT 87.0 99.6 94.9 93.9   CBC  Recent Labs Lab 07/15/13 0400 07/16/13 0400 07/17/13 0345  HGB 9.5* 9.9* 10.2*  HCT 29.8* 30.6* 31.6*  WBC 14.6* 15.2* 14.0*  PLT 276 340 407*    COAGULATION  Recent Labs Lab 07/13/13 0431  INR 0.98   CARDIAC   Recent Labs Lab 07/11/13 0500  TROPONINI 3.04*   No results found for this basename: PROBNP,  in the last 168 hours  CHEMISTRY  Recent Labs Lab 07/13/13 0430 07/14/13 0425 07/15/13 0400 07/16/13 0400 07/17/13 0345  NA 152* 156* 150* 145 146  K 3.0* 3.4* 3.2* 3.6* 3.5*  CL 111 113* 106 107 107  CO2  30 32 29 25 23   GLUCOSE 155* 170* 137* 150* 131*  BUN 20 25* 20 18 20   CREATININE 0.81 0.92 0.82 0.66 0.66  CALCIUM 7.5* 8.2* 8.1* 8.0* 8.1*  MG  --   --   --  2.3 2.4  PHOS  --   --   --  3.9 4.5   Estimated Creatinine Clearance: 121.6 ml/min (by C-G formula based on Cr of 0.66).  LIVER  Recent Labs Lab 07/11/13 0445 07/13/13 0430 07/13/13 0431  AST 69* 52*  --   ALT 44 46  --   ALKPHOS 229* 171*  --   BILITOT 0.5 0.3  --   PROT 5.2* 4.9*  --   ALBUMIN 1.9* 1.8*  --   INR  --   --  0.98   INFECTIOUS No results found for this basename: LATICACIDVEN,  PROCALCITON,  in the last 168 hours  ENDOCRINE CBG (last 3)   Recent Labs  07/17/13 0324 07/17/13 0729 07/17/13 1124  GLUCAP 117* 142* 122*   IMAGING x48h  7/11-  RLL infiltrate improving daily, ett wnl, line wnl  ASSESSMENT / PLAN:  PULMONARY A:  Acute respiratory failure - secondary to acute MI; failed SBT on 7/11  Hx of asthma Airway obstruction HCAP   - does not meet sbt criteria due to seziures  P:   - Continue full vent support for now. - BDs  - Continue gentle diureses.  - Lurline Idol would be medically ineffective and not offer a quality of life  CARDIOVASCULAR A:  Ventricular fibrillation arrest Acute inferior STEMI due to occluded RCA - - S/p PCI of RCA per cardiology  Hx HTN  P:  - DAPT, ASA, Statin - Wean stress steroids  - reduce to  25mg  to daily then dc in AM.  RENAL A: Hypokalemia resolved P: - Chem in am   INFECTIOUS A: Aspiration pneumonia P: - Unasyn end date in place  GASTROINTESTINAL A:   Stress ulcer prophylaxis Hx GERD P:   - PPI - TF  HEMATOLOGIC A: VTE PPx Some hemoconcentration  P: - SQ heparin restart no bleeding further line - Brilinta   ENDOCRINE A: Hypoglycemia - r/o underlying liver dz, improved with TF - resolved R/o sick euthyroid (likely) P: - Treat underlying illness. - CBG and ISS.   NEUROLOGIC A: S/p Cardiac Arrest - CPR 15 mins, anoxic brain injury Hx ETOH abuse Hx Schizophrenia Rigid - on haldol at home = at risk NMS R/o ETOH WD component seziure 7/13  P: - Keppra increase per neuro - f/u neurology recs - fent prn  GLOBAL  Patient remains unresponsive with minimal sedation, likely severe anoxic injury.  Spoke with the entire family at length today, we have agreed to DNR status but they will be discussing plan of care.  Recommended against trach/peg.  The patient is critically ill with multiple organ systems failure and requires high complexity decision making for assessment and support,  frequent evaluation and titration of therapies, application of advanced monitoring technologies and extensive interpretation of multiple databases.   Critical Care Time devoted to patient care services described in this note is  35  Minutes.  Rush Farmer, M.D. [facility] Pulmonary/Critical Care Medicine. Pager: [phone]. After hours pager: [phone].

## 2013-07-17 NOTE — Progress Notes (Signed)
NUTRITION FOLLOW-UP  DOCUMENTATION CODES Per approved criteria  -Not Applicable   INTERVENTION: Continue Vital 1.5 formula at 65 ml/hr and decrease Prostat liquid protein to 30 ml daily via tube to provide 2440 kcals, 121 gm protein, 1191 ml of free water. RD to follow for nutrition care plan  NUTRITION DIAGNOSIS: Inadequate oral intake related to inability to eat as evidenced by NPO, ongoing.  Goal: Pt to meet >/= 90% of their estimated nutrition needs, currently met  Monitor:  TF regimen & tolerance, respiratory status, weight, labs, I/O's  ASSESSMENT: Pt with PMHx of HTN, asthma, chronic pain, schizophrenia, and alcohol abuse. Presented to ER 7/2 with hip pain after a fall and released. Pt brought in by EMS after successful resuscitation of out of cardiac arrest, was intubated. Initial ECG showed inferior wall myocardial infarction, so code STEMI was activated. He has also been started on cooling protocol.   Patient s/p cardiac cath 7/3. Patient had seizures on 7/13.  Patient is currently intubated on ventilator support -- OGT in place. Per MD note, trach would be medically ineffective and not offer a quality of life. MV: 21.5 L/min Temp (24hrs), Avg:98.9 F (37.2 C), Min:98.4 F (36.9 C), Max:99.7 F (37.6 C)  Neurology continues to follow patient - pt noted to continue to be densely encephalopathic.  Receiving Vital 1.5 at 65 ml/hr with 30 ml Prostat liquid protein via tube BID. Tolerating well. Receiving free water of 200 ml via tube q 4 hours.  Potassium low at 3.5 Phosphorus and magnesium WNL  Height: Ht Readings from Last 1 Encounters:  07/13/13 $RemoveB'5\' 11"'UDHQCUDy$  (1.803 m)    Weight: Wt Readings from Last 1 Encounters:  07/17/13 178 lb 2.1 oz (80.8 kg)  Admit wt 184 lb   BMI:  Body mass index is 24.86 kg/(m^2). Overweight  Re-estimated needs: Kcal: 2370 Protein: 121-135 gm Fluid: per MD  Skin: intact   Diet Order: NPO   Intake/Output Summary (Last 24 hours)  at 07/17/13 1028 Last data filed at 07/17/13 0727  Gross per 24 hour  Intake   1620 ml  Output   1525 ml  Net     95 ml    Last BM: 7/12  Labs:   Recent Labs Lab 07/15/13 0400 07/16/13 0400 07/17/13 0345  NA 150* 145 146  K 3.2* 3.6* 3.5*  CL 106 107 107  CO2 $Re'29 25 23  'XhH$ BUN $R'20 18 20  'Yh$ CREATININE 0.82 0.66 0.66  CALCIUM 8.1* 8.0* 8.1*  MG  --  2.3 2.4  PHOS  --  3.9 4.5  GLUCOSE 137* 150* 131*    CBG (last 3)   Recent Labs  07/16/13 2331 07/17/13 0324 07/17/13 0729  GLUCAP 132* 117* 142*   Last BM:  Scheduled Meds: . albuterol  3 mL Inhalation Q6H  . antiseptic oral rinse  15 mL Mouth Rinse QID  . aspirin  81 mg Oral Daily  . atorvastatin  80 mg Oral q1800  . chlorhexidine  15 mL Mouth Rinse BID  . divalproex  500 mg Oral 3 times per day  . feeding supplement (PRO-STAT SUGAR FREE 64)  30 mL Per Tube BID  . folic acid  1 mg Per Tube Daily  . free water  200 mL Per Tube Q4H  . heparin subcutaneous  5,000 Units Subcutaneous 3 times per day  . hydrALAZINE  25 mg Oral 3 times per day  . insulin aspart  2-6 Units Subcutaneous 6 times per day  . levETIRAcetam (  KEPPRA) IVPB  1,000 mg Intravenous Q12H  . metoprolol tartrate  25 mg Oral BID  . pantoprazole (PROTONIX) IV  40 mg Intravenous QHS  . thiamine  100 mg Per Tube Daily  . ticagrelor  90 mg Oral BID    Continuous Infusions: . sodium chloride 20 mL/hr at 07/14/13 1523  . feeding supplement (VITAL 1.5 CAL) 1,000 mL (07/16/13 1827)    Inda Coke MS, RD, LDN Inpatient Registered Dietitian Pager: [phone] After-hours pager: [phone]

## 2013-07-18 ENCOUNTER — Inpatient Hospital Stay (HOSPITAL_COMMUNITY): Payer: Medicare Other

## 2013-07-18 LAB — BLOOD GAS, ARTERIAL
Acid-Base Excess: 1.5 mmol/L (ref 0.0–2.0)
BICARBONATE: 25.2 meq/L — AB (ref 20.0–24.0)
DRAWN BY: 252031
FIO2: 0.4 %
LHR: 18 {breaths}/min
MECHVT: 600 mL
O2 Saturation: 86.9 %
PCO2 ART: 37.1 mmHg (ref 35.0–45.0)
PEEP: 5 cmH2O
PH ART: 7.446 (ref 7.350–7.450)
PO2 ART: 53.1 mmHg — AB (ref 80.0–100.0)
Patient temperature: 98.5
TCO2: 26.3 mmol/L (ref 0–100)

## 2013-07-18 LAB — CBC WITH DIFFERENTIAL/PLATELET
BASOS PCT: 0 % (ref 0–1)
Basophils Absolute: 0 10*3/uL (ref 0.0–0.1)
EOS ABS: 0.6 10*3/uL (ref 0.0–0.7)
EOS PCT: 4 % (ref 0–5)
HCT: 31.3 % — ABNORMAL LOW (ref 39.0–52.0)
Hemoglobin: 10.1 g/dL — ABNORMAL LOW (ref 13.0–17.0)
Lymphocytes Relative: 16 % (ref 12–46)
Lymphs Abs: 2 10*3/uL (ref 0.7–4.0)
MCH: 32 pg (ref 26.0–34.0)
MCHC: 32.3 g/dL (ref 30.0–36.0)
MCV: 99.1 fL (ref 78.0–100.0)
Monocytes Absolute: 0.5 10*3/uL (ref 0.1–1.0)
Monocytes Relative: 4 % (ref 3–12)
NEUTROS PCT: 76 % (ref 43–77)
Neutro Abs: 9.5 10*3/uL — ABNORMAL HIGH (ref 1.7–7.7)
Platelets: 457 10*3/uL — ABNORMAL HIGH (ref 150–400)
RBC: 3.16 MIL/uL — ABNORMAL LOW (ref 4.22–5.81)
RDW: 14.9 % (ref 11.5–15.5)
WBC: 12.5 10*3/uL — ABNORMAL HIGH (ref 4.0–10.5)

## 2013-07-18 LAB — BASIC METABOLIC PANEL
ANION GAP: 15 (ref 5–15)
BUN: 21 mg/dL (ref 6–23)
CO2: 24 mEq/L (ref 19–32)
CREATININE: 0.7 mg/dL (ref 0.50–1.35)
Calcium: 8.3 mg/dL — ABNORMAL LOW (ref 8.4–10.5)
Chloride: 105 mEq/L (ref 96–112)
GFR calc Af Amer: >90 mL/min (ref >90)
Glucose, Bld: 121 mg/dL — ABNORMAL HIGH (ref 70–99)
Potassium: 3.9 mEq/L (ref 3.7–5.3)
SODIUM: 144 meq/L (ref 137–147)

## 2013-07-18 LAB — GLUCOSE, CAPILLARY
GLUCOSE-CAPILLARY: 109 mg/dL — AB (ref 70–99)
GLUCOSE-CAPILLARY: 110 mg/dL — AB (ref 70–99)
GLUCOSE-CAPILLARY: 114 mg/dL — AB (ref 70–99)
Glucose-Capillary: 117 mg/dL — ABNORMAL HIGH (ref 70–99)
Glucose-Capillary: 135 mg/dL — ABNORMAL HIGH (ref 70–99)
Glucose-Capillary: 122 mg/dL — ABNORMAL HIGH (ref 70–99)

## 2013-07-18 LAB — MAGNESIUM: Magnesium: 2.4 mg/dL (ref 1.5–2.5)

## 2013-07-18 LAB — PHOSPHORUS: PHOSPHORUS: 5.1 mg/dL — AB (ref 2.3–4.6)

## 2013-07-18 MED ORDER — POTASSIUM CHLORIDE 20 MEQ/15ML (10%) PO LIQD
40.0000 meq | Freq: Three times a day (TID) | ORAL | Status: AC
  Administered 2013-07-18 (×2): 40 meq
  Filled 2013-07-18 (×2): qty 30

## 2013-07-18 MED ORDER — POTASSIUM CHLORIDE 20 MEQ/15ML (10%) PO LIQD
ORAL | Status: AC
  Filled 2013-07-18: qty 30

## 2013-07-18 MED ORDER — FUROSEMIDE 10 MG/ML IJ SOLN
20.0000 mg | Freq: Three times a day (TID) | INTRAMUSCULAR | Status: AC
  Administered 2013-07-18 (×2): 20 mg via INTRAVENOUS

## 2013-07-18 MED ORDER — FUROSEMIDE 10 MG/ML IJ SOLN
INTRAMUSCULAR | Status: AC
  Administered 2013-07-18: 20 mg via INTRAVENOUS
  Filled 2013-07-18: qty 4

## 2013-07-18 MED ORDER — INSULIN ASPART 100 UNIT/ML ~~LOC~~ SOLN
2.0000 [IU] | SUBCUTANEOUS | Status: DC
  Administered 2013-07-18 – 2013-07-19 (×2): 2 [IU] via SUBCUTANEOUS
  Administered 2013-07-19: 4 [IU] via SUBCUTANEOUS
  Administered 2013-07-20 – 2013-07-23 (×13): 2 [IU] via SUBCUTANEOUS
  Administered 2013-07-23: 4 [IU] via SUBCUTANEOUS

## 2013-07-18 MED ORDER — FUROSEMIDE 10 MG/ML IJ SOLN
INTRAMUSCULAR | Status: AC
  Filled 2013-07-18: qty 4

## 2013-07-18 NOTE — Procedures (Signed)
Continuous 8 hour video EEG monitoring  Monitoring ran on 07/17/2013.  History: This is a 48 year old male who presents with persistent confusion and unresponsiveness.  Medications: Albuterol, aspirin, Lipitor, Depakote, Keppra and Versed.  Technique: This is a continuous 8 hour video EEG monitoring with 21 channels of EEG, 2 channels of EOG as well as one channel of EKG.  It was performed during a state of stupor.  Description: As the tracing opens, the background consists of some generalized 6-7 Hz delta theta activity with a voltage range of 15-40 microvolts.  This activity appeared to be reactive to somatosensory stimulation.  As the recording continued there was no identifiable sleep architecture noted.  There are no asymmetries nor epileptiform discharges noted.There  were no pushbutton events noted during the monitoring.  Electrodiagnostic diagnosis: This  was an abnormal 8 hour continuous video EEG monitoring as demonstrated by the presence of some moderate diffuse slowing.  Clinical correlation: The above findings are consistent with a moderate diffuse or multifocal encephalopathy as can be seen secondary to medication effect, infection or other diffuse metabolic abnormalities.  There are no electrographic seizures or epileptiform discharges noted.   Consuello Bossier, MD

## 2013-07-18 NOTE — Progress Notes (Signed)
PULMONARY / CRITICAL CARE MEDICINE  Name: Chase Blackwell MRN: [id number] DOB: [birth year] PCP Default, Provider, MD   ADMISSION DATE:  2013-07-25  PRIMARY SERVICE: PCCM  CHIEF COMPLAINT:  VF Arrest -> Inferior STEMI (Cooling Protocol)  CARDIOLOGY: Harwani/Kadakia  BRIEF PATIENT DESCRIPTION:  Patient is a 48 year old male with PMH of asthma, HTN, EtOH abuse, chronic pain, schizoaffective d/o who presented to ER with VF arrest (down 15 mins) and inferior STEMI on 2022-07-26. Cooling protocol started and taken to cath lab for PCI with RCA stent placement.  7/5 Patient rewarmed, began having fine seizure-like activity. Treated with versed, neuro consulted.   LINES / TUBES: ETT Jul 26, 2022 >>> R IJ CVL 7/4 occluded>>>7/9 7/9 left IJ>>> L Rad art line 7/4 >>>7/10  CULTURES: Urine 2022/07/26 >>> neg MRSA by PCR 26-Jul-2022 >>> neg --------------------------------- 7/6 resp>>>Klieb pan sens 7/6 BC x 2 >>>coag neg staph 1/2  ANTIBIOTICS: Unasyn 7/6 >>> Vanc 7/7 >>>7/8  SIGNIFICANT EVENTS / STUDIES:  2022/07/26 Intubated in field 07-26-22 Taken to cath lab RCA occluded. Left system ok. S/p PCI Jul 26, 2022 Hypothermia protocol initiated 7/5 CT Head >>> no acute intracranial abnormality 7/5 EEG >>> activity marred by artifact but minimal 7/6 Rewarmed, normothermic 7/6 Continuous EEG stopped, myoclonic jerks remain 7/7- remains on sedation , tremors improved 7/8 MRI brain>>>neg acute, sinusitis 7/9- pause cardiac, reduced sedation, neg 1.6 liters 7/10- resolving rt hilar infiltrates 7/13- seizure noted 7/15 tracks but not following any command.  SUBJECTIVE:   No seizure overnight.  VITAL SIGNS: Filed Vitals:   07/18/13 0920 07/18/13 0931 07/18/13 1000 07/18/13 1100  BP: 133/65 133/65 96/49 118/66  Pulse: 88 92 87 85  Temp:   98.8 F (37.1 C) 98.6 F (37 C)  TempSrc:      Resp: 25 37 26 27  Height:      Weight:      SpO2: 99% 99% 95% 99%   VENTILATOR SETTINGS: Vent Mode:  [-] PRVC FiO2 (%):  [40 %-50 %] 40  % Set Rate:  [18 bmp] 18 bmp Vt Set:  [600 mL] 600 mL PEEP:  [5 cmH20] 5 cmH20 Plateau Pressure:  [16 cmH20-23 cmH20] 16 cmH20  INTAKE / OUTPUT: I/O last 3 completed shifts: In: 4350.3 [I.V.:820.3; NG/GT:3210; IV Piggyback:320] Out: 9629 [Urine:5470]   PHYSICAL EXAMINATION:  Gen: eyes open, on vent, tracks but not following any meaningful command HEENT: PERRL 5 mm PULM: CTA CV: RRR, no mgr AB: BS+ soft nontender Ext: warm no edema Neuro: perrl 5 mm, sightly moves neck and arms to pain on knuckles, no localizing  LABS: PULMONARY  Recent Labs Lab 07/12/13 0925 07/12/13 1215 07/18/13 0321  PHART 7.568* 7.434 7.446  PCO2ART 31.1* 49.1* 37.1  PO2ART 64.4* 71.4* 53.1*  HCO3 28.4* 32.4* 25.2*  TCO2 29.4 33.9 26.3  O2SAT 94.9 93.9 86.9   CBC  Recent Labs Lab 07/16/13 0400 07/17/13 0345 07/18/13 0345  HGB 9.9* 10.2* 10.1*  HCT 30.6* 31.6* 31.3*  WBC 15.2* 14.0* 12.5*  PLT 340 407* 457*    COAGULATION  Recent Labs Lab 07/13/13 0431  INR 0.98   CARDIAC  No results found for this basename: TROPONINI,  in the last 168 hours No results found for this basename: PROBNP,  in the last 168 hours  CHEMISTRY  Recent Labs Lab 07/14/13 0425 07/15/13 0400 07/16/13 0400 07/17/13 0345 07/18/13 0345  NA 156* 150* 145 146 144  K 3.4* 3.2* 3.6* 3.5* 3.9  CL 113* 106 107 107 105  CO2 32 29 25  23 24  GLUCOSE 170* 137* 150* 131* 121*  BUN 25* 20 18 20 21   CREATININE 0.92 0.82 0.66 0.66 0.70  CALCIUM 8.2* 8.1* 8.0* 8.1* 8.3*  MG  --   --  2.3 2.4 2.4  PHOS  --   --  3.9 4.5 5.1*   Estimated Creatinine Clearance: 121.6 ml/min (by C-G formula based on Cr of 0.7).  LIVER  Recent Labs Lab 07/13/13 0430 07/13/13 0431  AST 52*  --   ALT 46  --   ALKPHOS 171*  --   BILITOT 0.3  --   PROT 4.9*  --   ALBUMIN 1.8*  --   INR  --  0.98   INFECTIOUS No results found for this basename: LATICACIDVEN, PROCALCITON,  in the last 168 hours  ENDOCRINE CBG (last 3)    Recent Labs  07/17/13 2354 07/18/13 0327 07/18/13 0806  GLUCAP 110* 109* 117*   IMAGING x48h  7/11-  RLL infiltrate improving daily, ett wnl, line wnl  ASSESSMENT / PLAN:  PULMONARY A:  Acute respiratory failure - secondary to acute MI; failed SBT on 7/11  Hx of asthma Airway obstruction HCAP  P:   - Begin PS trials but no extubation until we communicate with family regarding trach/peg as neurology is recommending continual support. - BDs. - Continue gentle diureses.  - Lurline Idol would be medically ineffective and not offer a quality of life but given communication with neuro will need to address this further with the family.  CARDIOVASCULAR A:  Ventricular fibrillation arrest Acute inferior STEMI due to occluded RCA - - S/p PCI of RCA per cardiology  Hx HTN  P:  - DAPT, ASA, Statin - D/Ced stress dose steroids.  RENAL A: Hypokalemia resolved P: - Chem in am  - Replace electrolytes as indicated.  INFECTIOUS A: Aspiration pneumonia P: - Unasyn end date in place  GASTROINTESTINAL A:   Stress ulcer prophylaxis Hx GERD P:   - PPI - TF  HEMATOLOGIC A: VTE PPx Some hemoconcentration  P: - SQ heparin restart no further bleeding from TLC. - Brilinta as ordered.  ENDOCRINE A: Hypoglycemia - r/o underlying liver dz, improved with TF - resolved R/o sick euthyroid (likely) P: - Treat underlying illness. - CBG and ISS.   NEUROLOGIC A: S/p Cardiac Arrest - CPR 15 mins, anoxic brain injury Hx ETOH abuse Hx Schizophrenia Rigid - on haldol at home = at risk NMS R/o ETOH WD component seziure 7/13  P: - Keppra increase per neuro - F/u neurology recs - Fent prn - On propofol, will attempt to get off today.  GLOBAL  Patient remains agitated off sedation, not purposeful but per neuro is improving and neuro recommend trach/peg.  Family is to come in today so we can discuss trach/peg options.  The patient is critically ill with multiple organ systems  failure and requires high complexity decision making for assessment and support, frequent evaluation and titration of therapies, application of advanced monitoring technologies and extensive interpretation of multiple databases.   Critical Care Time devoted to patient care services described in this note is  35  Minutes.  Rush Farmer, M.D. [facility] Pulmonary/Critical Care Medicine. Pager: [phone]. After hours pager: [phone].

## 2013-07-18 NOTE — Progress Notes (Signed)
Subjective:  Patient remains intubated and sedated being weaned off from narcotics and propafol.  Objective:  Vital Signs in the last 24 hours: Temp:  [98.2 F (36.8 C)-99.1 F (37.3 C)] 98.6 F (37 C) (07/15 1100) Pulse Rate:  [30-100] 87 (07/15 1134) Resp:  [17-41] 26 (07/15 1134) BP: (80-162)/(39-98) 118/66 mmHg (07/15 1134) SpO2:  [94 %-99 %] 99 % (07/15 1134) FiO2 (%):  [40 %-50 %] 40 % (07/15 1134) Weight:  [79.5 kg (175 lb 4.3 oz)] 79.5 kg (175 lb 4.3 oz) (07/15 0200)  Intake/Output from previous day: 07/14 0701 - 07/15 0700 In: 3015.3 [I.V.:600.3; NG/GT:2095; IV Piggyback:320] Out: 4595 [Urine:4595] Intake/Output from this shift: Total I/O In: 769.3 [I.V.:109.3; Other:200; NG/GT:460] Out: 325 [Urine:325]  Physical Exam: Neck: no adenopathy, no carotid bruit, no JVD and supple, symmetrical, trachea midline Lungs: decreased breath sound at bases with occasional rhonchi Heart: regular rate and rhythm, S1, S2 normal and soft systolic murmur noted no S3 gallop Abdomen: soft bowel sounds present Extremities: extremities normal, atraumatic, no cyanosis or edema  Lab Results:  Recent Labs  07/17/13 0345 07/18/13 0345  WBC 14.0* 12.5*  HGB 10.2* 10.1*  PLT 407* 457*    Recent Labs  07/17/13 0345 07/18/13 0345  NA 146 144  K 3.5* 3.9  CL 107 105  CO2 23 24  GLUCOSE 131* 121*  BUN 20 21  CREATININE 0.66 0.70   No results found for this basename: TROPONINI, CK, MB,  in the last 72 hours Hepatic Function Panel No results found for this basename: PROT, ALBUMIN, AST, ALT, ALKPHOS, BILITOT, BILIDIR, IBILI,  in the last 72 hours No results found for this basename: CHOL,  in the last 72 hours No results found for this basename: PROTIME,  in the last 72 hours  Imaging: Imaging results have been reviewed and Dg Chest Port 1 View  07/18/2013   CLINICAL DATA:  ET tube placement.  Respiratory distress.  EXAM: PORTABLE CHEST - 1 VIEW  COMPARISON:  07/16/2013.  FINDINGS:  Endotracheal tube tip lies 4.7 cm above the carina. Central venous catheter from a LEFT IJ approach lies with its tip in the proximal SVC. Cardiac size within normal limits. BILATERAL lower lobe opacities appear worse and could represent pneumonia or atelectasis. No visible pneumothorax.  IMPRESSION: ET tube 4.7 cm above carina.  Slight worsening aeration.   Electronically Signed   By: Rolla Flatten M.D.   On: 07/18/2013 07:44    Cardiac Studies:  Assessment/Plan:  Acute inferior wall myocardial infarction status post PTCA stenting to 100% occluded RCA  Status post V. fib cardiac arrest  Acute respiratory failure secondary to above rule out aspiration  Hypertension  History of bronchial asthma  GERD  History of schizoaffective disorder  EtOH abuse  Chronic pain syndrome  Probable anoxic encephalopathy  Seizure disorder  Plan Continue present management per CCM  LOS: 12 days    Chase Blackwell N 07/18/2013, 12:23 PM

## 2013-07-19 ENCOUNTER — Inpatient Hospital Stay (HOSPITAL_COMMUNITY): Payer: Medicare Other

## 2013-07-19 LAB — BASIC METABOLIC PANEL
ANION GAP: 15 (ref 5–15)
BUN: 22 mg/dL (ref 6–23)
CO2: 27 meq/L (ref 19–32)
Calcium: 8.4 mg/dL (ref 8.4–10.5)
Chloride: 104 mEq/L (ref 96–112)
Creatinine, Ser: 0.73 mg/dL (ref 0.50–1.35)
GFR calc Af Amer: >90 mL/min (ref >90)
GFR calc non Af Amer: >90 mL/min (ref >90)
Glucose, Bld: 136 mg/dL — ABNORMAL HIGH (ref 70–99)
Potassium: 3.7 mEq/L (ref 3.7–5.3)
Sodium: 146 mEq/L (ref 137–147)

## 2013-07-19 LAB — BLOOD GAS, ARTERIAL
Acid-Base Excess: 2 mmol/L (ref 0.0–2.0)
Bicarbonate: 25.2 mEq/L — ABNORMAL HIGH (ref 20.0–24.0)
DRAWN BY: 31101
FIO2: 40 %
MECHVT: 600 mL
O2 Saturation: 95.1 %
PEEP/CPAP: 5 cmH2O
PH ART: 7.486 — AB (ref 7.350–7.450)
PO2 ART: 76.8 mmHg — AB (ref 80.0–100.0)
Patient temperature: 98.6
RATE: 18 resp/min
TCO2: 26.2 mmol/L (ref 0–100)
pCO2 arterial: 33.7 mmHg — ABNORMAL LOW (ref 35.0–45.0)

## 2013-07-19 LAB — GLUCOSE, CAPILLARY
GLUCOSE-CAPILLARY: 105 mg/dL — AB (ref 70–99)
GLUCOSE-CAPILLARY: 108 mg/dL — AB (ref 70–99)
GLUCOSE-CAPILLARY: 131 mg/dL — AB (ref 70–99)
GLUCOSE-CAPILLARY: 98 mg/dL (ref 70–99)
Glucose-Capillary: 153 mg/dL — ABNORMAL HIGH (ref 70–99)
Glucose-Capillary: 118 mg/dL — ABNORMAL HIGH (ref 70–99)
Glucose-Capillary: 111 mg/dL — ABNORMAL HIGH (ref 70–99)

## 2013-07-19 LAB — MAGNESIUM: Magnesium: 2.6 mg/dL — ABNORMAL HIGH (ref 1.5–2.5)

## 2013-07-19 LAB — CBC WITH DIFFERENTIAL/PLATELET
Basophils Absolute: 0 10*3/uL (ref 0.0–0.1)
Basophils Relative: 0 % (ref 0–1)
EOS ABS: 0.6 10*3/uL (ref 0.0–0.7)
EOS PCT: 4 % (ref 0–5)
HCT: 32 % — ABNORMAL LOW (ref 39.0–52.0)
HEMOGLOBIN: 10.2 g/dL — AB (ref 13.0–17.0)
Lymphocytes Relative: 14 % (ref 12–46)
Lymphs Abs: 1.8 10*3/uL (ref 0.7–4.0)
MCH: 31.8 pg (ref 26.0–34.0)
MCHC: 31.9 g/dL (ref 30.0–36.0)
MCV: 99.7 fL (ref 78.0–100.0)
MONO ABS: 0.6 10*3/uL (ref 0.1–1.0)
Monocytes Relative: 5 % (ref 3–12)
Neutro Abs: 9.8 10*3/uL — ABNORMAL HIGH (ref 1.7–7.7)
Neutrophils Relative %: 77 % (ref 43–77)
PLATELETS: 563 10*3/uL — AB (ref 150–400)
RBC: 3.21 MIL/uL — ABNORMAL LOW (ref 4.22–5.81)
RDW: 15.5 % (ref 11.5–15.5)
WBC: 12.7 10*3/uL — ABNORMAL HIGH (ref 4.0–10.5)

## 2013-07-19 LAB — PHOSPHORUS: Phosphorus: 5.7 mg/dL — ABNORMAL HIGH (ref 2.3–4.6)

## 2013-07-19 MED ORDER — FENTANYL CITRATE 0.05 MG/ML IJ SOLN
200.0000 ug | Freq: Once | INTRAMUSCULAR | Status: AC
  Filled 2013-07-19: qty 4

## 2013-07-19 MED ORDER — POTASSIUM CHLORIDE 20 MEQ/15ML (10%) PO LIQD
40.0000 meq | Freq: Three times a day (TID) | ORAL | Status: AC
  Administered 2013-07-19 (×2): 40 meq
  Filled 2013-07-19 (×3): qty 30

## 2013-07-19 MED ORDER — PRO-STAT SUGAR FREE PO LIQD
60.0000 mL | Freq: Two times a day (BID) | ORAL | Status: DC
  Administered 2013-07-19 – 2013-07-21 (×4): 60 mL
  Administered 2013-07-22: 10:00:00
  Administered 2013-07-22 – 2013-07-23 (×2): 60 mL
  Filled 2013-07-19 (×9): qty 60

## 2013-07-19 MED ORDER — PROPOFOL 10 MG/ML IV EMUL
5.0000 ug/kg/min | INTRAVENOUS | Status: DC
  Administered 2013-07-19: 55 ug/kg/min via INTRAVENOUS
  Administered 2013-07-20: 35 ug/kg/min via INTRAVENOUS
  Administered 2013-07-20: 50 ug/kg/min via INTRAVENOUS
  Administered 2013-07-20 (×3): 60 ug/kg/min via INTRAVENOUS
  Administered 2013-07-20: 50 ug/kg/min via INTRAVENOUS
  Administered 2013-07-21 (×2): 35 ug/kg/min via INTRAVENOUS
  Filled 2013-07-19 (×9): qty 100

## 2013-07-19 MED ORDER — VECURONIUM BROMIDE 10 MG IV SOLR
10.0000 mg | Freq: Once | INTRAVENOUS | Status: AC
  Filled 2013-07-19: qty 10

## 2013-07-19 MED ORDER — FUROSEMIDE 10 MG/ML IJ SOLN
INTRAMUSCULAR | Status: AC
  Filled 2013-07-19: qty 4

## 2013-07-19 MED ORDER — ETOMIDATE 2 MG/ML IV SOLN
40.0000 mg | Freq: Once | INTRAVENOUS | Status: DC
  Filled 2013-07-19: qty 20

## 2013-07-19 MED ORDER — FUROSEMIDE 10 MG/ML IJ SOLN
20.0000 mg | Freq: Three times a day (TID) | INTRAMUSCULAR | Status: AC
  Administered 2013-07-19 (×2): 20 mg via INTRAVENOUS
  Filled 2013-07-19: qty 2

## 2013-07-19 MED ORDER — FREE WATER
200.0000 mL | Freq: Three times a day (TID) | Status: DC
  Administered 2013-07-19 – 2013-07-21 (×6): 200 mL

## 2013-07-19 MED ORDER — VITAL 1.5 CAL PO LIQD
1000.0000 mL | ORAL | Status: DC
Start: 2013-07-19 — End: 2013-07-23
  Administered 2013-07-19 – 2013-07-22 (×5): 1000 mL
  Filled 2013-07-19 (×6): qty 1000

## 2013-07-19 MED ORDER — PROPOFOL 10 MG/ML IV EMUL: 5.0000 ug/kg/min | Freq: Once | INTRAVENOUS | Status: AC

## 2013-07-19 MED ORDER — MIDAZOLAM HCL 2 MG/2ML IJ SOLN
4.0000 mg | Freq: Once | INTRAMUSCULAR | Status: AC
  Filled 2013-07-19: qty 4

## 2013-07-19 NOTE — Progress Notes (Signed)
PULMONARY / CRITICAL CARE MEDICINE  Name: Chase Blackwell MRN: [id number] DOB: Apr 16, 1965 PCP Default, Provider, MD   ADMISSION DATE:  2013-07-17  PRIMARY SERVICE: PCCM  CHIEF COMPLAINT:  VF Arrest -> Inferior STEMI (Cooling Protocol)  CARDIOLOGY: Harwani/Kadakia  BRIEF PATIENT DESCRIPTION:  Patient is a 48 year old male with PMH of asthma, HTN, EtOH abuse, chronic pain, schizoaffective d/o who presented to ER with VF arrest (down 15 mins) and inferior STEMI on July 18, 2022. Cooling protocol started and taken to cath lab for PCI with RCA stent placement.  7/5 Patient rewarmed, began having fine seizure-like activity. Treated with versed, neuro consulted.   LINES / TUBES: ETT 07/18/22 >>> R IJ CVL 7/4 occluded>>>7/9 7/9 left IJ>>> L Rad art line 7/4 >>>7/10  CULTURES: Urine Jul 18, 2022 >>> neg MRSA by PCR 2022/07/18 >>> neg --------------------------------- 7/6 resp>>>Klieb pan sens 7/6 BC x 2 >>>coag neg staph 1/2  ANTIBIOTICS: Unasyn 7/6 >>>7/15 Vanc 7/7 >>>7/8  SIGNIFICANT EVENTS / STUDIES:  2022-07-18 Intubated in field 07/18/22 Taken to cath lab RCA occluded. Left system ok. S/p PCI 18-Jul-2022 Hypothermia protocol initiated 7/5 CT Head >>> no acute intracranial abnormality 7/5 EEG >>> activity marred by artifact but minimal 7/6 Rewarmed, normothermic 7/6 Continuous EEG stopped, myoclonic jerks remain 7/7- remains on sedation , tremors improved 7/8 MRI brain>>>neg acute, sinusitis 7/9- pause cardiac, reduced sedation, neg 1.6 liters 7/10- resolving rt hilar infiltrates 7/13- seizure noted 7/15 tracks but not following any command.  SUBJECTIVE:   No events overnight, no agitation with propofol onboard.  VITAL SIGNS: Filed Vitals:   07/19/13 0400 07/19/13 0500 07/19/13 0600 07/19/13 0700  BP: 102/47 76/28 74/40  112/49  Pulse: 89 77 77 70  Temp: 98.6 F (37 C) 98.6 F (37 C) 97.7 F (36.5 C) 97.7 F (36.5 C)  TempSrc: Core (Comment)     Resp: 28 19 19 21   Height:      Weight:  169 lb 8.5 oz (76.9  kg)    SpO2: 94% 96% 96% 100%   VENTILATOR SETTINGS: Vent Mode:  [-] PRVC FiO2 (%):  [40 %] 40 % Set Rate:  [18 bmp] 18 bmp Vt Set:  [600 mL] 600 mL PEEP:  [5 cmH20] 5 cmH20 Plateau Pressure:  [10 cmH20-20 cmH20] 13 cmH20  INTAKE / OUTPUT: I/O last 3 completed shifts: In: 5192.9 [I.V.:1072.9; Other:200; NG/GT:3610; IV Piggyback:310] Out: 6520 [Urine:6520]   PHYSICAL EXAMINATION:  Gen: eyes open, on vent, tracks but not following any meaningful command HEENT: PERRL 5 mm PULM: Coarse BS diffusely CV: RRR, no mgr AB: BS+ soft nontender Ext: warm no edema Neuro: perrl 5 mm, sightly moves neck and arms to pain on knuckles, no localizing  LABS: PULMONARY  Recent Labs Lab 07/12/13 0925 07/12/13 1215 07/18/13 0321 07/19/13 0350  PHART 7.568* 7.434 7.446 7.486*  PCO2ART 31.1* 49.1* 37.1 33.7*  PO2ART 64.4* 71.4* 53.1* 76.8*  HCO3 28.4* 32.4* 25.2* 25.2*  TCO2 29.4 33.9 26.3 26.2  O2SAT 94.9 93.9 86.9 95.1   CBC  Recent Labs Lab 07/17/13 0345 07/18/13 0345 07/19/13 0500  HGB 10.2* 10.1* 10.2*  HCT 31.6* 31.3* 32.0*  WBC 14.0* 12.5* 12.7*  PLT 407* 457* 563*   COAGULATION  Recent Labs Lab 07/13/13 0431  INR 0.98   CARDIAC  No results found for this basename: TROPONINI,  in the last 168 hours No results found for this basename: PROBNP,  in the last 168 hours  CHEMISTRY  Recent Labs Lab 07/15/13 0400 07/16/13 0400 07/17/13 0345 07/18/13 0345 07/19/13 0500  NA 150* 145 146 144 146  K 3.2* 3.6* 3.5* 3.9 3.7  CL 106 107 107 105 104  CO2 29 25 23 24 27   GLUCOSE 137* 150* 131* 121* 136*  BUN 20 18 20 21 22   CREATININE 0.82 0.66 0.66 0.70 0.73  CALCIUM 8.1* 8.0* 8.1* 8.3* 8.4  MG  --  2.3 2.4 2.4 2.6*  PHOS  --  3.9 4.5 5.1* 5.7*   Estimated Creatinine Clearance: 121.6 ml/min (by C-G formula based on Cr of 0.73).  LIVER  Recent Labs Lab 07/13/13 0430 07/13/13 0431  AST 52*  --   ALT 46  --   ALKPHOS 171*  --   BILITOT 0.3  --   PROT 4.9*   --   ALBUMIN 1.8*  --   INR  --  0.98   INFECTIOUS No results found for this basename: LATICACIDVEN, PROCALCITON,  in the last 168 hours  ENDOCRINE CBG (last 3)   Recent Labs  07/18/13 1955 07/19/13 0005 07/19/13 0402  GLUCAP 122* 108* 153*   IMAGING x48h  7/11-  RLL infiltrate improving daily, ett wnl, line wnl  ASSESSMENT / PLAN:  PULMONARY A:  Acute respiratory failure - secondary to acute MI; failed SBT on 7/11  Hx of asthma Airway obstruction HCAP  P:   - PS trials but no extubation, planned trach in AM. - BDs. - Continue gentle diureses.   CARDIOVASCULAR A:  Ventricular fibrillation arrest Acute inferior STEMI due to occluded RCA - - S/p PCI of RCA per cardiology  Hx HTN  P:  - DAPT, ASA, Statin - D/Ced stress dose steroids.  RENAL A: Hypokalemia resolved P: - Chem in am  - Replace electrolytes as indicated. - Low dose lasix today. - Added free water for hypernatremia (evolving).  INFECTIOUS A: Aspiration pneumonia P: - Unasyn course complete  GASTROINTESTINAL A:   Stress ulcer prophylaxis Hx GERD P:   - PPI - TF  HEMATOLOGIC A: VTE PPx Some hemoconcentration  P: - SQ heparin restart no further bleeding from TLC. - Brilinta as ordered.  ENDOCRINE A: Hypoglycemia - r/o underlying liver dz, improved with TF - resolved R/o sick euthyroid (likely) P: - Treat underlying illness. - CBG and ISS.   NEUROLOGIC A: S/p Cardiac Arrest - CPR 15 mins, anoxic brain injury Hx ETOH abuse Hx Schizophrenia Rigid - on haldol at home = at risk NMS R/o ETOH WD component seziure 7/13  P: - Keppra increase per neuro - F/u neurology recs - Fent prn - On propofol, anticipate will be off tomorrow after trach.  GLOBAL  Does not follow command, some tracking, trach in AM, diurese and free water + replace electrolytes.  Pre-trach order set in place.  The patient is critically ill with multiple organ systems failure and requires high  complexity decision making for assessment and support, frequent evaluation and titration of therapies, application of advanced monitoring technologies and extensive interpretation of multiple databases.   Critical Care Time devoted to patient care services described in this note is  35  Minutes.  Rush Farmer, M.D. [facility] Pulmonary/Critical Care Medicine. Pager: [phone]. After hours pager: [phone].

## 2013-07-19 NOTE — Progress Notes (Signed)
Subjective:  Patient remains intubated sedated propofol being weaned off. Schedule for trach tomorrow. Remains in sinus rhythm no arrhythmias or pauses noted.  Objective:  Vital Signs in the last 24 hours: Temp:  [97.7 F (36.5 C)-99.1 F (37.3 C)] 98.6 F (37 C) (07/16 1000) Pulse Rate:  [70-99] 96 (07/16 1000) Resp:  [19-35] 24 (07/16 1000) BP: (74-143)/(28-92) 113/62 mmHg (07/16 1000) SpO2:  [93 %-100 %] 95 % (07/16 1000) FiO2 (%):  [40 %] 40 % (07/16 0830) Weight:  [76.9 kg (169 lb 8.5 oz)] 76.9 kg (169 lb 8.5 oz) (07/16 0500)  Intake/Output from previous day: 07/15 0701 - 07/16 0700 In: 4279.2 [I.V.:844.2; NG/GT:3025; IV Piggyback:210] Out: 4400 [Urine:4400] Intake/Output from this shift: Total I/O In: 400.6 [I.V.:115.6; NG/GT:285] Out: 450 [Urine:450]  Physical Exam: Neck: no adenopathy, no carotid bruit, no JVD and supple, symmetrical, trachea midline Lungs: Decreased breath sound at bases clear anteriorly Heart: regular rate and rhythm, S1, S2 normal and Soft systolic murmur noted Abdomen: Soft bowel sounds present Extremities: extremities normal, atraumatic, no cyanosis or edema  Lab Results:  Recent Labs  07/18/13 0345 07/19/13 0500  WBC 12.5* 12.7*  HGB 10.1* 10.2*  PLT 457* 563*    Recent Labs  07/18/13 0345 07/19/13 0500  NA 144 146  K 3.9 3.7  CL 105 104  CO2 24 27  GLUCOSE 121* 136*  BUN 21 22  CREATININE 0.70 0.73   No results found for this basename: TROPONINI, CK, MB,  in the last 72 hours Hepatic Function Panel No results found for this basename: PROT, ALBUMIN, AST, ALT, ALKPHOS, BILITOT, BILIDIR, IBILI,  in the last 72 hours No results found for this basename: CHOL,  in the last 72 hours No results found for this basename: PROTIME,  in the last 72 hours  Imaging: Imaging results have been reviewed and Dg Chest Port 1 View  07/19/2013   CLINICAL DATA:  Assess endotracheal to  EXAM: PORTABLE CHEST - 1 VIEW  COMPARISON:  07/18/2013   FINDINGS: Endotracheal tube ends at the level of the clavicular heads. The orogastric tube reaches the stomach. Left IJ catheter in good position.  Normal heart size and mediastinal contours.  Streaky lower lung opacities are unchanged. No increasing pleural fluid. No pneumothorax. No pulmonary edema.  IMPRESSION: 1. Tubes and central line remain in good position. 2. Unchanged bibasilar opacification which favors atelectasis.   Electronically Signed   By: Jorje Guild M.D.   On: 07/19/2013 06:45   Dg Chest Port 1 View  07/18/2013   CLINICAL DATA:  ET tube placement.  Respiratory distress.  EXAM: PORTABLE CHEST - 1 VIEW  COMPARISON:  07/16/2013.  FINDINGS: Endotracheal tube tip lies 4.7 cm above the carina. Central venous catheter from a LEFT IJ approach lies with its tip in the proximal SVC. Cardiac size within normal limits. BILATERAL lower lobe opacities appear worse and could represent pneumonia or atelectasis. No visible pneumothorax.  IMPRESSION: ET tube 4.7 cm above carina.  Slight worsening aeration.   Electronically Signed   By: Rolla Flatten M.D.   On: 07/18/2013 07:44    Cardiac Studies:  Assessment/Plan:  Acute inferior wall myocardial infarction status post PTCA stenting to 100% occluded RCA  Status post V. fib cardiac arrest  Acute respiratory failure secondary to above status post aspiration pneumonia  Hypertension  History of bronchial asthma  GERD  History of schizoaffective disorder  EtOH abuse  Chronic pain syndrome  Probable anoxic encephalopathy  Seizure disorder  Plan Continue present management per CCM No active cardiac issues at this point I will sign off please call if needed  LOS: 13 days    Chase Blackwell 07/19/2013, 10:46 AM

## 2013-07-19 NOTE — Progress Notes (Signed)
NEURO HOSPITALIST PROGRESS NOTE   SUBJECTIVE:                                                                                                                        Remains intubated on the vent. Awake but does not follow commands. No further seizures noted. On keppra and Depakote.   OBJECTIVE:                                                                                                                           Vital signs in last 24 hours: Temp:  [97.7 F (36.5 C)-99.1 F (37.3 C)] 97.7 F (36.5 C) (07/16 0700) Pulse Rate:  [70-98] 70 (07/16 0700) Resp:  [18-37] 21 (07/16 0700) BP: (74-143)/(28-92) 112/49 mmHg (07/16 0700) SpO2:  [93 %-100 %] 100 % (07/16 0700) FiO2 (%):  [40 %] 40 % (07/16 0400) Weight:  [76.9 kg (169 lb 8.5 oz)] 76.9 kg (169 lb 8.5 oz) (07/16 0500)  Intake/Output from previous day: 07/15 0701 - 07/16 0700 In: 4279.2 [I.V.:844.2; NG/GT:3025; IV Piggyback:210] Out: 4400 [Urine:4400] Intake/Output this shift: Total I/O In: 102 [I.V.:37; NG/GT:65] Out: 50 [Urine:50] Nutritional status: NPO  Past Medical History  Diagnosis Date  . Hypertension   . Asthma   . GERD (gastroesophageal reflux disease)   . Chronic pain   . Schizophrenia   . Alcohol abuse     Neurologic Exam:  MS: opens eyes spontaneously. Does not follow commands.  CN: pupils 4 mm, reactive bilaterally. EOM full without nystagmus.  Corneals intact. Face appears to be symmetric. Tongue: intubated. Motor: he has apparent voluntary spontaneous movements of all extremities. He withdraws briskly to noxious stimuli.  Sensory:as above.  DTR:3+ on left, 2+ on right.  Plantars: upgoing in the right. Coordination and gait: unable to test.   Lab Results: No results found for this basename: cbc, bmp, coags, chol, tri, ldl, hga1c   Lipid Panel No results found for this basename: CHOL, TRIG, HDL, CHOLHDL, VLDL, LDLCALC,  in the last 72  hours  Studies/Results: Dg Chest Port 1 View  07/19/2013   CLINICAL DATA:  Assess endotracheal to  EXAM: PORTABLE CHEST - 1 VIEW  COMPARISON:  07/18/2013  FINDINGS: Endotracheal tube ends at the level  of the clavicular heads. The orogastric tube reaches the stomach. Left IJ catheter in good position.  Normal heart size and mediastinal contours.  Streaky lower lung opacities are unchanged. No increasing pleural fluid. No pneumothorax. No pulmonary edema.  IMPRESSION: 1. Tubes and central line remain in good position. 2. Unchanged bibasilar opacification which favors atelectasis.   Electronically Signed   By: Jorje Guild M.D.   On: 07/19/2013 06:45   Dg Chest Port 1 View  07/18/2013   CLINICAL DATA:  ET tube placement.  Respiratory distress.  EXAM: PORTABLE CHEST - 1 VIEW  COMPARISON:  07/16/2013.  FINDINGS: Endotracheal tube tip lies 4.7 cm above the carina. Central venous catheter from a LEFT IJ approach lies with its tip in the proximal SVC. Cardiac size within normal limits. BILATERAL lower lobe opacities appear worse and could represent pneumonia or atelectasis. No visible pneumothorax.  IMPRESSION: ET tube 4.7 cm above carina.  Slight worsening aeration.   Electronically Signed   By: Rolla Flatten M.D.   On: 07/18/2013 07:44    MEDICATIONS                                                                                                                        Scheduled: . albuterol  3 mL Inhalation Q6H  . antiseptic oral rinse  15 mL Mouth Rinse QID  . aspirin  81 mg Oral Daily  . atorvastatin  80 mg Oral q1800  . chlorhexidine  15 mL Mouth Rinse BID  . divalproex  500 mg Oral 3 times per day  . etomidate  40 mg Intravenous Once  . feeding supplement (PRO-STAT SUGAR FREE 64)  30 mL Per Tube Daily  . fentaNYL  200 mcg Intravenous Once  . folic acid  1 mg Per Tube Daily  . free water  200 mL Per Tube Q4H  . free water  200 mL Per Tube 3 times per day  . furosemide  20 mg Intravenous Q8H   . heparin subcutaneous  5,000 Units Subcutaneous 3 times per day  . hydrALAZINE  25 mg Oral 3 times per day  . insulin aspart  2-6 Units Subcutaneous 6 times per day  . levETIRAcetam (KEPPRA) IVPB  1,000 mg Intravenous Q12H  . metoprolol tartrate  25 mg Oral BID  . midazolam  4 mg Intravenous Once  . pantoprazole (PROTONIX) IV  40 mg Intravenous QHS  . potassium chloride  40 mEq Per Tube TID  . propofol  5-70 mcg/kg/min Intravenous Once  . thiamine  100 mg Per Tube Daily  . ticagrelor  90 mg Oral BID  . vecuronium  10 mg Intravenous Once    ASSESSMENT/PLAN:  Post cardiac arrest anoxic encephalopathy with symptomatic seizures that are currently well controlled on keppra and depakote. Continue current AEDs. Plan for extubation tomorrow as per ICU attending. Slow improvement. Will continue to follow.   Dorian Pod, MD [facility] [phone]  07/19/2013, 8:08 AM

## 2013-07-19 NOTE — Progress Notes (Signed)
NUTRITION FOLLOW-UP  DOCUMENTATION CODES Per approved criteria  -Not Applicable   INTERVENTION: To better meet nutritional needs, decrease Vital 1.5 formula to 35 ml/hr and increase Prostat liquid protein to 60 ml BID via tube to provide 1260 kcals, 117 gm protein, 642 ml of free water. *Rest of kcals will be met with current propofol infusion. RD to follow for nutrition care plan.  NUTRITION DIAGNOSIS: Inadequate oral intake related to inability to eat as evidenced by NPO, ongoing.  Goal: Pt to meet >/= 90% of their estimated nutrition needs, currently met  Monitor:  TF regimen & tolerance, respiratory status, weight, labs, I/O's  ASSESSMENT: Pt with PMHx of HTN, asthma, chronic pain, schizophrenia, and alcohol abuse. Presented to ER 7/2 with hip pain after a fall and released. Pt brought in by EMS after successful resuscitation of out of cardiac arrest, was intubated. Initial ECG showed inferior wall myocardial infarction, so code STEMI was activated. He has also been started on cooling protocol.   Patient s/p cardiac cath 7/3. Patient had seizures on 7/13.  Patient is currently intubated on ventilator support -- OGT in place. Plan for trach in AM. MV: 11.9 L/min Temp (24hrs), Avg:98.6 F (37 C), Min:97.7 F (36.5 C), Max:99.1 F (37.3 C)  Propofol: 14.5 ml/hr - provides 383 ml daily  Neurology continues to follow patient - pt is awake but does not follow commands. No further seizures noted.  Receiving Vital 1.5 at 65 ml/hr with 30 ml Prostat liquid protein via tube BID. Tolerating well. Receiving free water of 200 ml via tube TID.  Potassium now WNL Phosphorus elevated at 5.7 Magnesium elevated at 2.6  Height: Ht Readings from Last 1 Encounters:  07/13/13 $RemoveB'5\' 11"'ipxgADrs$  (1.803 m)    Weight: Wt Readings from Last 1 Encounters:  07/19/13 169 lb 8.5 oz (76.9 kg)  Admit wt 184 lb   BMI:  Body mass index is 23.66 kg/(m^2). Overweight  Re-estimated needs: Kcal:  1985 Protein: 108-125 gm Fluid: per MD  Skin: stage II L hip pressure ulcer  Diet Order: NPO   Intake/Output Summary (Last 24 hours) at 07/19/13 1324 Last data filed at 07/19/13 1217  Gross per 24 hour  Intake 3873.06 ml  Output   4775 ml  Net -901.94 ml    Last BM: 7/16  Labs:   Recent Labs Lab 07/17/13 0345 07/18/13 0345 07/19/13 0500  NA 146 144 146  K 3.5* 3.9 3.7  CL 107 105 104  CO2 $Re'23 24 27  'QcL$ BUN $R'20 21 22  'wu$ CREATININE 0.66 0.70 0.73  CALCIUM 8.1* 8.3* 8.4  MG 2.4 2.4 2.6*  PHOS 4.5 5.1* 5.7*  GLUCOSE 131* 121* 136*    CBG (last 3)   Recent Labs  07/19/13 0402 07/19/13 0727 07/19/13 1138  GLUCAP 153* 118* 105*   Last BM:  Scheduled Meds: . albuterol  3 mL Inhalation Q6H  . antiseptic oral rinse  15 mL Mouth Rinse QID  . aspirin  81 mg Oral Daily  . atorvastatin  80 mg Oral q1800  . chlorhexidine  15 mL Mouth Rinse BID  . divalproex  500 mg Oral 3 times per day  . etomidate  40 mg Intravenous Once  . feeding supplement (PRO-STAT SUGAR FREE 64)  30 mL Per Tube Daily  . fentaNYL  200 mcg Intravenous Once  . folic acid  1 mg Per Tube Daily  . free water  200 mL Per Tube 3 times per day  . furosemide  20 mg  Intravenous Q8H  . heparin subcutaneous  5,000 Units Subcutaneous 3 times per day  . hydrALAZINE  25 mg Oral 3 times per day  . insulin aspart  2-6 Units Subcutaneous 6 times per day  . levETIRAcetam (KEPPRA) IVPB  1,000 mg Intravenous Q12H  . metoprolol tartrate  25 mg Oral BID  . midazolam  4 mg Intravenous Once  . pantoprazole (PROTONIX) IV  40 mg Intravenous QHS  . potassium chloride  40 mEq Per Tube TID  . propofol  5-70 mcg/kg/min Intravenous Once  . thiamine  100 mg Per Tube Daily  . ticagrelor  90 mg Oral BID  . vecuronium  10 mg Intravenous Once    Continuous Infusions: . sodium chloride 20 mL/hr at 07/19/13 0320  . feeding supplement (VITAL 1.5 CAL) 1,000 mL (07/19/13 0230)  . propofol 30 mcg/kg/min (07/19/13 1217)     Inda Coke MS, RD, LDN Inpatient Registered Dietitian Pager: [phone] After-hours pager: [phone]

## 2013-07-20 ENCOUNTER — Inpatient Hospital Stay (HOSPITAL_COMMUNITY): Payer: Medicare Other

## 2013-07-20 DIAGNOSIS — J9600 Acute respiratory failure, unspecified whether with hypoxia or hypercapnia: Secondary | ICD-10-CM

## 2013-07-20 LAB — BLOOD GAS, ARTERIAL
Acid-Base Excess: 1 mmol/L (ref 0.0–2.0)
BICARBONATE: 24.7 meq/L — AB (ref 20.0–24.0)
Drawn by: 41934
FIO2: 50 %
MECHVT: 600 mL
O2 SAT: 95.3 %
PEEP/CPAP: 5 cmH2O
Patient temperature: 98.6
RATE: 18 resp/min
TCO2: 25.8 mmol/L (ref 0–100)
pCO2 arterial: 36.6 mmHg (ref 35.0–45.0)
pH, Arterial: 7.444 (ref 7.350–7.450)
pO2, Arterial: 79.9 mmHg — ABNORMAL LOW (ref 80.0–100.0)

## 2013-07-20 LAB — BASIC METABOLIC PANEL
Anion gap: 15 (ref 5–15)
BUN: 31 mg/dL — ABNORMAL HIGH (ref 6–23)
CALCIUM: 8.4 mg/dL (ref 8.4–10.5)
CO2: 25 mEq/L (ref 19–32)
Chloride: 105 mEq/L (ref 96–112)
Creatinine, Ser: 0.83 mg/dL (ref 0.50–1.35)
GFR calc Af Amer: >90 mL/min (ref >90)
GFR calc non Af Amer: >90 mL/min (ref >90)
GLUCOSE: 99 mg/dL (ref 70–99)
Potassium: 4.6 mEq/L (ref 3.7–5.3)
SODIUM: 145 meq/L (ref 137–147)

## 2013-07-20 LAB — CBC
HCT: 32.2 % — ABNORMAL LOW (ref 39.0–52.0)
HEMOGLOBIN: 10.5 g/dL — AB (ref 13.0–17.0)
MCH: 32.6 pg (ref 26.0–34.0)
MCHC: 32.6 g/dL (ref 30.0–36.0)
MCV: 100 fL (ref 78.0–100.0)
Platelets: 616 10*3/uL — ABNORMAL HIGH (ref 150–400)
RBC: 3.22 MIL/uL — AB (ref 4.22–5.81)
RDW: 16.1 % — ABNORMAL HIGH (ref 11.5–15.5)
WBC: 21.9 10*3/uL — ABNORMAL HIGH (ref 4.0–10.5)

## 2013-07-20 LAB — GLUCOSE, CAPILLARY
GLUCOSE-CAPILLARY: 96 mg/dL (ref 70–99)
GLUCOSE-CAPILLARY: 125 mg/dL — AB (ref 70–99)
Glucose-Capillary: 86 mg/dL (ref 70–99)
Glucose-Capillary: 118 mg/dL — ABNORMAL HIGH (ref 70–99)
Glucose-Capillary: 121 mg/dL — ABNORMAL HIGH (ref 70–99)

## 2013-07-20 LAB — APTT: APTT: 29 s (ref 24–37)

## 2013-07-20 LAB — PROTIME-INR
INR: 1.09 (ref 0.00–1.49)
Prothrombin Time: 14.1 seconds (ref 11.6–15.2)

## 2013-07-20 LAB — PHOSPHORUS: PHOSPHORUS: 6.9 mg/dL — AB (ref 2.3–4.6)

## 2013-07-20 LAB — MAGNESIUM: MAGNESIUM: 2.7 mg/dL — AB (ref 1.5–2.5)

## 2013-07-20 LAB — VALPROIC ACID LEVEL: Valproic Acid Lvl: 34.6 ug/mL — ABNORMAL LOW (ref 50.0–100.0)

## 2013-07-20 MED ORDER — PROPOFOL BOLUS VIA INFUSION
100.0000 mg | Freq: Once | INTRAVENOUS | Status: AC
  Administered 2013-07-20: 100 mg via INTRAVENOUS

## 2013-07-20 MED ORDER — VECURONIUM BROMIDE 10 MG IV SOLR
8.0000 mg | Freq: Once | INTRAVENOUS | Status: AC
  Administered 2013-07-20: 8 mg via INTRAVENOUS

## 2013-07-20 MED ORDER — MIDAZOLAM HCL 2 MG/2ML IJ SOLN
2.0000 mg | Freq: Once | INTRAMUSCULAR | Status: AC
Start: 2013-07-20 — End: 2013-07-20
  Administered 2013-07-20: 2 mg via INTRAVENOUS

## 2013-07-20 MED ORDER — FUROSEMIDE 8 MG/ML PO SOLN
40.0000 mg | Freq: Every day | ORAL | Status: DC
  Administered 2013-07-20 – 2013-07-21 (×2): 40 mg
  Filled 2013-07-20 (×3): qty 5

## 2013-07-20 MED ORDER — FENTANYL CITRATE 0.05 MG/ML IJ SOLN
100.0000 ug | Freq: Once | INTRAMUSCULAR | Status: AC
  Administered 2013-07-20: 100 ug via INTRAVENOUS

## 2013-07-20 NOTE — Procedures (Signed)
Bronch was introduce through ET tube and structures of tracheal rings, carina identified for operator of tracheostomy who was Dr Nelda Marseille. Light of bronch passed through trachea and skin for indentification of tracheal rings for tracheostomy puncture. After this, under bronchoscopy guidance,  ET tube was pulled back sufficiently and very carefully. The ET tube was  pulled back enough to give room for tracheostomy operator and yet at same time to to ensure a secured airway. After this was accomplished, bronchoscope was withdrawn into the ET tube. After this,  Dr Nelda Marseille then performed tracheostomy under video visual provided by flexible video bronchoscopy. Followng introduction of tracheostomy,  the bronchoscope was removed from ET tube and introduced through tracheostomy. Correct position of tracheostomy was ensured, with enough room between carina and distal tracheostomy and no evidence of bleeding. The bronchoscope was then withdrawn. Respiratory therapist was then instructed to remove the ET tube.  Dr Nelda Marseille then proceeded to complete the tracheostomy with stay sutures   No complications

## 2013-07-20 NOTE — Procedures (Signed)
Intubation Procedure Note NICKLOUS ABURTO [id number] [birth year]  Procedure: Intubation Indications: Respiratory insufficiency  Procedure Details Consent: Unable to obtain consent because of emergent medical necessity. Time Out: Verified patient identification, verified procedure, site/side was marked, verified correct patient position, special equipment/implants available, medications/allergies/relevent history reviewed, required imaging and test results available.  Performed  Maximum sterile technique was used including antiseptics, gloves, hand hygiene and mask.  MAC    Evaluation Hemodynamic Status: BP stable throughout; O2 sats: stable throughout Patient's Current Condition: stable Complications: No apparent complications Patient did tolerate procedure well. Chest X-ray ordered to verify placement.  CXR: pending.   Jennet Maduro 07/20/2013

## 2013-07-20 NOTE — Progress Notes (Signed)
PULMONARY / CRITICAL CARE MEDICINE  Name: Chase Blackwell MRN: [id number] DOB: 1965-11-12  ADMISSION DATE:  07/06/2013  PRIMARY SERVICE: PCCM  CHIEF COMPLAINT:  VF Arrest  CARDIOLOGY: Harwani/Kadakia  BRIEF PATIENT DESCRIPTION:  48 yo admitted 7/3 after VF arrest (downtime 15 mins) and treated for inferior STEMI.  Placed on hypothermia protocol.  Taken to cath lab for PCI with RCA stent placement.  Course complicated by seizures post rewarming.  LINES / TUBES: ETT 7/3 >>> 7/17 Trach (JY) 7/17 >>> R IJ CVL 7/4 >>> 7/9 L IJ CVL 7/9 >>> L R AL 7/4 >>> 7/10  CULTURES: 7/3  Urine 7/3 >>> neg 7/3  MRSA PCR >>> neg 7/6  Respiratory >>> KLEBSIELLA  7/6  Blood >>> coag neg staph 1/2  ANTIBIOTICS: Unasyn 7/6 >>>7/15 Vancomycin 7/7 >>>7/8  SIGNIFICANT EVENTS / STUDIES:  7/3    Cath lab >>> RCA stent 7/3    Hypothermia protocol initiated 7/5    CT Head >>> nad 7/5    EEG >>> minimal activity 7/6    Rewarmed 7/8    MRI brain >>> nad 7/13  Seizure noted 7/17  Tracheostomy  INTERVAL HISTORY:  No issues overnight  VITAL SIGNS: Temp:  [97.9 F (36.6 C)-100.1 F (37.8 C)] 98.6 F (37 C) (07/18 1123) Pulse Rate:  [73-116] 76 (07/18 1123) Cardiac Rhythm:  [-] Sinus tachycardia (07/18 0730) Resp:  [15-31] 20 (07/18 1123) BP: (72-164)/(32-96) 105/52 mmHg (07/18 1123) SpO2:  [94 %-100 %] 96 % (07/18 1123) FiO2 (%):  [40 %-100 %] 40 % (07/18 1139) Weight:  [72.9 kg (160 lb 11.5 oz)] 72.9 kg (160 lb 11.5 oz) (07/18 0356)  VENTILATOR SETTINGS: Vent Mode:  [-] PSV FiO2 (%):  [40 %-100 %] 40 % Set Rate:  [18 bmp] 18 bmp Vt Set:  [600 mL] 600 mL PEEP:  [5 cmH20] 5 cmH20 Pressure Support:  [10 cmH20] 10 cmH20 Plateau Pressure:  [14 cmH20-20 cmH20] 20 cmH20  INTAKE / OUTPUT: I/O last 3 completed shifts: In: 3491.1 [I.V.:1502.5; NG/GT:1658.7; IV Piggyback:330] Out: 2706 [Urine:3730]   PHYSICAL EXAMINATION: General:  Resting in no distress Neuro:  Encephalopathic, nonfocal,  cough / gag diminished HEENT:  PERRL, tracheostomy without active hemorrhage Cardiovascular:  Regular Lungs:  Bilateral air entry, rhonchi Abdomen:  Soft, nontender, bowel sounds diminished Musculoskeletal:  Moves all extremities, no edema Skin:  Intact  LABS: CBC  Recent Labs Lab 07/19/13 0500 07/20/13 0447 07/21/13 0428  WBC 12.7* 21.9* 18.0*  HGB 10.2* 10.5* 10.3*  HCT 32.0* 32.2* 32.4*  PLT 563* 616* 671*   Coag's  Recent Labs Lab 07/20/13 0447  APTT 29  INR 1.09   BMET  Recent Labs Lab 07/19/13 0500 07/20/13 0447 07/21/13 0428  NA 146 145 144  K 3.7 4.6 3.9  CL 104 105 105  CO2 27 25 24   BUN 22 31* 29*  CREATININE 0.73 0.83 0.70  GLUCOSE 136* 99 125*   Electrolytes  Recent Labs Lab 07/19/13 0500 07/20/13 0447 07/21/13 0428  CALCIUM 8.4 8.4 8.5  MG 2.6* 2.7* 2.7*  PHOS 5.7* 6.9* 4.7*   Sepsis Markers No results found for this basename: LATICACIDVEN, PROCALCITON, O2SATVEN,  in the last 168 hours  ABG  Recent Labs Lab 07/19/13 0350 07/20/13 0254 07/21/13 0500  PHART 7.486* 7.444 7.455*  PCO2ART 33.7* 36.6 32.9*  PO2ART 76.8* 79.9* 74.5*   Liver Enzymes No results found for this basename: AST, ALT, ALKPHOS, BILITOT, ALBUMIN,  in the last 168 hours  Cardiac Enzymes  No results found for this basename: TROPONINI, PROBNP,  in the last 168 hours  Glucose  Recent Labs Lab 07/20/13 1122 07/20/13 1643 07/20/13 1953 07/20/13 2356 07/21/13 0349 07/21/13 1122  GLUCAP 121* 96 125* 113* 112* 110*   IMAGING: Dg Chest Port 1 View  07/21/2013   CLINICAL DATA:  Recent placed tracheostomy tube.  Infiltrates.  EXAM: PORTABLE CHEST - 1 VIEW  COMPARISON:  07/20/2013  FINDINGS: Right lower lobe opacity has improved, clearing peripherally but persisting centrally. Milder opacity in the left medial lung base has shown a more subtle improvement. No new lung opacities. No pleural effusion. No pneumothorax.  Tracheostomy tube in left internal jugular  central venous line are stable and well positioned. Enteric tube passes below the diaphragm into the stomach and below the included field of view.  IMPRESSION: 1. Improved lung base opacities, most notable on the right. 2. Support apparatus is stable in well positioned.   Electronically Signed   By: Lajean Manes M.D.   On: 07/21/2013 07:24   Chest Portable 1 View To Assess Tube Placement And Rule-out Pneumothorax  07/20/2013   CLINICAL DATA:  Post tracheotomy  EXAM: PORTABLE CHEST - 1 VIEW  COMPARISON:  Portable exam 1459 hr compared to 0615 hr  FINDINGS: Endotracheal tube has been replaced by tracheostomy tube, tip projecting 7.0 cm above carina.  Nasogastric tube extends into stomach.  LEFT jugular central venous catheter tip projects over SVC.  Normal heart size, mediastinal contours and pulmonary vascularity.  Persistent atelectasis RIGHT lower lobe.  Mild persistent medial LEFT lower lobe infiltrate.  Remaining lungs grossly clear.  No pleural effusion or pneumothorax.  IMPRESSION: New tracheostomy tube.  Persistent RIGHT lower lobe atelectasis and medial LEFT lower lobe infiltrate.   Electronically Signed   By: Lavonia Dana M.D.   On: 07/20/2013 15:18   Dg Chest Port 1 View  07/20/2013   CLINICAL DATA:  Intubated patient.  EXAM: PORTABLE CHEST - 1 VIEW  COMPARISON:  Single view of the chest 07/19/2013 and 07/18/2013.  FINDINGS: Support tubes are unchanged. Right worse than left basilar airspace disease shows marked improvement. There is no pneumothorax. Heart size is normal.  IMPRESSION: Marked improvement in bibasilar airspace disease. No new abnormality.   Electronically Signed   By: Inge Rise M.D.   On: 07/20/2013 08:05   Dg Abd Portable 1v  07/20/2013   CLINICAL DATA:  Status post tracheotomy.  EXAM: PORTABLE ABDOMEN - 1 VIEW  COMPARISON:  Chest in two views abdomen 10/18/2008.  FINDINGS: Feeding tube is in place with the tip in the body of the stomach directed toward the duodenum. Bowel  gas pattern is unremarkable. No abnormal abdominal calcification or focal bony abnormality is seen. Rectal temperature probe is noted.  IMPRESSION: Feeding tube tip is in the body of the stomach.  No acute finding.   Electronically Signed   By: Inge Rise M.D.   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 P:   Goal pH>7.30, SpO2>92 Trach collar as tolerated ABG 1 hour after placed on trach collar Vent PRN VAP bundle Trend ABG/CXR Albuterol  CARDIOVASCULAR A:  VF arrest STEMI s/p stent to RCA HTN P:  Cardiology following ASA, Brilinta, Lipitor Metoprolol, Hydralazine Labetalol PRN  RENAL A: Hypernatremia P: Trend BMP Lasix 40  D/c free water  INFECTIOUS A: Pneumonia P: Completed abx  GASTROINTESTINAL A:   Nutrition GERD P:  NPO TF Protonix  HEMATOLOGIC A: VTE Px P: Trend CBC Heparin   ENDOCRINE A: Hypo / hyperglycemia P: SSI  NEUROLOGIC A: Acute encephalopathy Possible anoxia Schizophrenia Seziure 7/13 P: Neurology following Keppra Depakote D/c Propofol D/c Versed Fentanyl PRN Thiamin / Folate  Transfer to SDU under PCCM  I have personally obtained history, examined patient, evaluated and interpreted laboratory and imaging results, reviewed medical records, formulated assessment / plan and placed orders.  CRITICAL CARE:  The patient is critically ill with multiple organ systems failure and requires high complexity decision making for assessment and support, frequent evaluation and titration of therapies, application of advanced monitoring technologies and extensive interpretation of multiple databases. Critical Care Time devoted to patient care services described in this note is 35 minutes.   Doree Fudge, MD Pulmonary and [place] Pager: [phone]  07/21/2013, 11:56  AM

## 2013-07-20 NOTE — Procedures (Signed)
Bedside Tracheostomy Insertion Procedure Note   Patient Details:   Name: Chase Blackwell DOB: [birth year] MRN: [id number]  Procedure: Tracheostomy  Pre Procedure Assessment: ET Tube Size:8.0 ET Tube secured at lip (cm):24  Bite block in place: No Breath Sounds: Diminished  Post Procedure Assessment: BP 119/59  Pulse 79  Temp(Src) 98.8 F (37.1 C) (Core (Comment))  Resp 22  Ht 5\' 11"  (1.803 m)  Wt 164 lb 14.5 oz (74.8 kg)  BMI 23.01 kg/m2  SpO2 98% O2 sats: stable throughout Complications: No apparent complications Patient did tolerate procedure well Tracheostomy Brand:Shiley Tracheostomy Style:Cuffed Tracheostomy Size: 8.0 Tracheostomy Secured YIR:[id number] Tracheostomy Placement Confirmation:Trach cuff visualized and in place and Chest X ray ordered for placement    Sharyne Richters 07/20/2013, 2:54 PM

## 2013-07-20 NOTE — Progress Notes (Signed)
PULMONARY / CRITICAL CARE MEDICINE  Name: Chase Blackwell MRN: [id number] DOB: [birth year] PCP Default, Provider, MD   ADMISSION DATE:  08/01/13  PRIMARY SERVICE: PCCM  CHIEF COMPLAINT:  VF Arrest -> Inferior STEMI (Cooling Protocol)  CARDIOLOGY: Harwani/Kadakia  BRIEF PATIENT DESCRIPTION:  Patient is a 48 year old male with PMH of asthma, HTN, EtOH abuse, chronic pain, schizoaffective d/o who presented to ER with VF arrest (down 15 mins) and inferior STEMI on 02-Aug-2022. Cooling protocol started and taken to cath lab for PCI with RCA stent placement.  7/5 Patient rewarmed, began having fine seizure-like activity. Treated with versed, neuro consulted.   LINES / TUBES: ETT 08-02-22 >>> R IJ CVL 7/4 occluded>>>7/9 7/9 left IJ>>> L Rad art line 7/4 >>>7/10  CULTURES: Urine 08-02-2022 >>> neg MRSA by PCR 2022-08-02 >>> neg --------------------------------- 7/6 resp>>>Klieb pan sens 7/6 BC x 2 >>>coag neg staph 1/2  ANTIBIOTICS: Unasyn 7/6 >>>7/15 Vanc 7/7 >>>7/8  SIGNIFICANT EVENTS / STUDIES:  08/02/22 Intubated in field 2022-08-02 Taken to cath lab RCA occluded. Left system ok. S/p PCI August 02, 2022 Hypothermia protocol initiated 7/5 CT Head >>> no acute intracranial abnormality 7/5 EEG >>> activity marred by artifact but minimal 7/6 Rewarmed, normothermic 7/6 Continuous EEG stopped, myoclonic jerks remain 7/7- remains on sedation , tremors improved 7/8 MRI brain>>>neg acute, sinusitis 7/9- pause cardiac, reduced sedation, neg 1.6 liters 7/10- resolving rt hilar infiltrates 7/13- seizure noted 7/15 tracks but not following any command.  SUBJECTIVE:   No events overnight, on propofol overnight.  VITAL SIGNS: Filed Vitals:   07/20/13 0905 07/20/13 1000 07/20/13 1100 07/20/13 1120  BP:  130/48 82/37 82/37   Pulse:  105 81 81  Temp:  98.8 F (37.1 C) 98.8 F (37.1 C)   TempSrc:    Core (Comment)  Resp:  25 21 20   Height:      Weight:      SpO2: 97% 96% 94% 93%   VENTILATOR SETTINGS: Vent Mode:  [-]  PRVC FiO2 (%):  [40 %-50 %] 40 % Set Rate:  [18 bmp] 18 bmp Vt Set:  [600 mL] 600 mL PEEP:  [5 cmH20] 5 cmH20 Plateau Pressure:  [17 cmH20-19 cmH20] 17 cmH20  INTAKE / OUTPUT: I/O last 3 completed shifts: In: 5278.9 [I.V.:1453.9; NG/GT:3505; IV Piggyback:320] Out: 5075 [Urine:5075]   PHYSICAL EXAMINATION:  Gen: eyes open, on vent, not tracking today or following any meaningful commands HEENT: PERRL 5 mm PULM: Coarse BS diffusely CV: RRR, no mgr AB: BS+ soft nontender Ext: warm no edema Neuro: perrl 5 mm, sightly moves neck and arms to pain on knuckles, no localizing  LABS: PULMONARY  Recent Labs Lab 07/18/13 0321 07/19/13 0350 07/20/13 0254  PHART 7.446 7.486* 7.444  PCO2ART 37.1 33.7* 36.6  PO2ART 53.1* 76.8* 79.9*  HCO3 25.2* 25.2* 24.7*  TCO2 26.3 26.2 25.8  O2SAT 86.9 95.1 95.3   CBC  Recent Labs Lab 07/18/13 0345 07/19/13 0500 07/20/13 0447  HGB 10.1* 10.2* 10.5*  HCT 31.3* 32.0* 32.2*  WBC 12.5* 12.7* 21.9*  PLT 457* 563* 616*   COAGULATION  Recent Labs Lab 07/20/13 0447  INR 1.09   CARDIAC  No results found for this basename: TROPONINI,  in the last 168 hours No results found for this basename: PROBNP,  in the last 168 hours  CHEMISTRY  Recent Labs Lab 07/16/13 0400 07/17/13 0345 07/18/13 0345 07/19/13 0500 07/20/13 0447  NA 145 146 144 146 145  K 3.6* 3.5* 3.9 3.7 4.6  CL 107 107 105 104  105  CO2 25 23 24 27 25   GLUCOSE 150* 131* 121* 136* 99  BUN 18 20 21 22  31*  CREATININE 0.66 0.66 0.70 0.73 0.83  CALCIUM 8.0* 8.1* 8.3* 8.4 8.4  MG 2.3 2.4 2.4 2.6* 2.7*  PHOS 3.9 4.5 5.1* 5.7* 6.9*   Estimated Creatinine Clearance: 116.4 ml/min (by C-G formula based on Cr of 0.83).  LIVER  Recent Labs Lab 07/20/13 0447  INR 1.09   INFECTIOUS No results found for this basename: LATICACIDVEN, PROCALCITON,  in the last 168 hours  ENDOCRINE CBG (last 3)   Recent Labs  07/19/13 2335 07/20/13 0352 07/20/13 0730  GLUCAP 98 86 118*    IMAGING x48h  7/11-  RLL infiltrate improving daily, ett wnl, line wnl  ASSESSMENT / PLAN:  PULMONARY A:  Acute respiratory failure - secondary to acute MI; failed SBT on 7/11  Hx of asthma Airway obstruction HCAP  P:   - Trach today then begin weaning trials after. - BDs. - Titrate O2 for sats.  CARDIOVASCULAR A:  Ventricular fibrillation arrest Acute inferior STEMI due to occluded RCA - - S/p PCI of RCA per cardiology  Hx HTN  P:  - DAPT, ASA, Statin - D/Ced stress dose steroids.  RENAL A: Hypokalemia resolved P: - Chem in am  - Replace electrolytes as indicated. - Lasix 40 mg PO daily. - Continue free water.  INFECTIOUS A: Aspiration pneumonia P: - Unasyn course completed  GASTROINTESTINAL A:   Stress ulcer prophylaxis Hx GERD P:   - PPI - TF  HEMATOLOGIC A: VTE PPx Some hemoconcentration  P: - SQ heparin restart no further bleeding from TLC. - Brilinta as ordered.  ENDOCRINE A: Hypoglycemia - r/o underlying liver dz, improved with TF - resolved R/o sick euthyroid (likely) P: - Treat underlying illness. - CBG and ISS.   NEUROLOGIC A: S/p Cardiac Arrest - CPR 15 mins, anoxic brain injury Hx ETOH abuse Hx Schizophrenia Rigid - on haldol at home = at risk NMS R/o ETOH WD component seziure 7/13  P: - Keppra increase per neuro - F/u neurology recs - Fent prn - On propofol, hopefully can d/c post trach. - If agitated post trach will consider anti-psychotic  GLOBAL  Trach today then will evaluate mental status post off sedation.  The patient is critically ill with multiple organ systems failure and requires high complexity decision making for assessment and support, frequent evaluation and titration of therapies, application of advanced monitoring technologies and extensive interpretation of multiple databases.   Critical Care Time devoted to patient care services described in this note is  35  Minutes.  Rush Farmer,  M.D. [facility] Pulmonary/Critical Care Medicine. Pager: [phone]. After hours pager: [phone].

## 2013-07-20 NOTE — Procedures (Signed)
Percutaneous Tracheostomy Placement  Consent from brother, patient sedated and positioned and paralyzed, area cleaned lidocaine/epi injected, skin incision done followed by blunt dissection, airway entered and wire placed and visualized bronchoscopically, airway crushed and dilated.  Size 8 shiley trach placed and sutured, visualized bronchoscopically well above carina.  CXR ordered and pending.  Patient tolerated the procedure well without complications.  Rush Farmer, M.D. [facility] Pulmonary/Critical Care Medicine. Pager: [phone]. After hours pager: [phone].

## 2013-07-21 ENCOUNTER — Inpatient Hospital Stay (HOSPITAL_COMMUNITY): Payer: Medicare Other

## 2013-07-21 LAB — GLUCOSE, CAPILLARY
GLUCOSE-CAPILLARY: 110 mg/dL — AB (ref 70–99)
GLUCOSE-CAPILLARY: 112 mg/dL — AB (ref 70–99)
Glucose-Capillary: 112 mg/dL — ABNORMAL HIGH (ref 70–99)
Glucose-Capillary: 113 mg/dL — ABNORMAL HIGH (ref 70–99)
Glucose-Capillary: 122 mg/dL — ABNORMAL HIGH (ref 70–99)
Glucose-Capillary: 128 mg/dL — ABNORMAL HIGH (ref 70–99)

## 2013-07-21 LAB — CBC
HCT: 32.4 % — ABNORMAL LOW (ref 39.0–52.0)
Hemoglobin: 10.3 g/dL — ABNORMAL LOW (ref 13.0–17.0)
MCH: 32.1 pg (ref 26.0–34.0)
MCHC: 31.8 g/dL (ref 30.0–36.0)
MCV: 100.9 fL — AB (ref 78.0–100.0)
PLATELETS: 671 10*3/uL — AB (ref 150–400)
RBC: 3.21 MIL/uL — ABNORMAL LOW (ref 4.22–5.81)
RDW: 16.2 % — AB (ref 11.5–15.5)
WBC: 18 10*3/uL — ABNORMAL HIGH (ref 4.0–10.5)

## 2013-07-21 LAB — BLOOD GAS, ARTERIAL
Acid-base deficit: 0.6 mmol/L (ref 0.0–2.0)
Bicarbonate: 22.8 mEq/L (ref 20.0–24.0)
DRAWN BY: 41934
FIO2: 40 %
O2 Saturation: 94.5 %
PEEP/CPAP: 5 cmH2O
PH ART: 7.455 — AB (ref 7.350–7.450)
PO2 ART: 74.5 mmHg — AB (ref 80.0–100.0)
Patient temperature: 98.6
RATE: 18 resp/min
TCO2: 23.8 mmol/L (ref 0–100)
VT: 600 mL
pCO2 arterial: 32.9 mmHg — ABNORMAL LOW (ref 35.0–45.0)

## 2013-07-21 LAB — BASIC METABOLIC PANEL
ANION GAP: 15 (ref 5–15)
BUN: 29 mg/dL — AB (ref 6–23)
CALCIUM: 8.5 mg/dL (ref 8.4–10.5)
CHLORIDE: 105 meq/L (ref 96–112)
CO2: 24 mEq/L (ref 19–32)
CREATININE: 0.7 mg/dL (ref 0.50–1.35)
Glucose, Bld: 125 mg/dL — ABNORMAL HIGH (ref 70–99)
Potassium: 3.9 mEq/L (ref 3.7–5.3)
Sodium: 144 mEq/L (ref 137–147)

## 2013-07-21 LAB — TRIGLYCERIDES: Triglycerides: 76 mg/dL (ref <150)

## 2013-07-21 LAB — MAGNESIUM: Magnesium: 2.7 mg/dL — ABNORMAL HIGH (ref 1.5–2.5)

## 2013-07-21 LAB — PHOSPHORUS: Phosphorus: 4.7 mg/dL — ABNORMAL HIGH (ref 2.3–4.6)

## 2013-07-21 MED ORDER — DIVALPROEX SODIUM 125 MG PO CPSP
250.0000 mg | ORAL_CAPSULE | Freq: Three times a day (TID) | ORAL | Status: DC
  Administered 2013-07-21 – 2013-07-23 (×7): 250 mg via ORAL
  Filled 2013-07-21 (×9): qty 2

## 2013-07-21 NOTE — Progress Notes (Signed)
NEURO HOSPITALIST PROGRESS NOTE   SUBJECTIVE:                                                                                                                        Underwent trach yesterday. Awake but does not follow commands. No further seizures or other neurological developments noted. On keppra and Depakote. 7/17/ VPA level 34.6. Leukocytosis 18,000 but afebrile.    OBJECTIVE:                                                                                                                           Vital signs in last 24 hours: Temp:  [97.9 F (36.6 C)-100.1 F (37.8 C)] 99.4 F (37.4 C) (07/18 0803) Pulse Rate:  [73-116] 100 (07/18 0800) Resp:  [15-31] 22 (07/18 0800) BP: (72-164)/(32-96) 135/86 mmHg (07/18 0800) SpO2:  [93 %-100 %] 98 % (07/18 0815) FiO2 (%):  [40 %-100 %] 40 % (07/18 0815) Weight:  [72.9 kg (160 lb 11.5 oz)] 72.9 kg (160 lb 11.5 oz) (07/18 0356)  Intake/Output from previous day: 07/17 0701 - 07/18 0700 In: 2103.8 [I.V.:960.1; NG/GT:923.7; IV Piggyback:220] Out: 2580 [Urine:2580] Intake/Output this shift: Total I/O In: 71.1 [I.V.:36.1; NG/GT:35] Out: -  Nutritional status: NPO  Past Medical History  Diagnosis Date  . Hypertension   . Asthma   . GERD (gastroesophageal reflux disease)   . Chronic pain   . Schizophrenia   . Alcohol abuse     Neurologic Exam:  MS: opens eyes spontaneously. Does not follow commands.  CN: pupils 4 mm, reactive bilaterally. EOM full without nystagmus. Corneals intact. Face appears to be symmetric. Tongue is midline. Motor: he has apparent voluntary spontaneous movements of all extremities. He withdraws briskly to noxious stimuli with arms flexion.  Tone: normal Sensory:as above.  DTR: 2+ all over Plantars: downgoing Coordination and gait: unable to test   Lab Results: No results found for this basename: cbc, bmp, coags, chol, tri, ldl, hga1c   Lipid Panel  Recent Labs  07/21/13 0428  TRIG 76    Studies/Results: Dg Chest Port 1 View  07/21/2013   CLINICAL DATA:  Recent placed tracheostomy tube.  Infiltrates.  EXAM: PORTABLE CHEST - 1 VIEW  COMPARISON:  07/20/2013  FINDINGS:  Right lower lobe opacity has improved, clearing peripherally but persisting centrally. Milder opacity in the left medial lung base has shown a more subtle improvement. No new lung opacities. No pleural effusion. No pneumothorax.  Tracheostomy tube in left internal jugular central venous line are stable and well positioned. Enteric tube passes below the diaphragm into the stomach and below the included field of view.  IMPRESSION: 1. Improved lung base opacities, most notable on the right. 2. Support apparatus is stable in well positioned.   Electronically Signed   By: Lajean Manes M.D.   On: 07/21/2013 07:24   Chest Portable 1 View To Assess Tube Placement And Rule-out Pneumothorax  07/20/2013   CLINICAL DATA:  Post tracheotomy  EXAM: PORTABLE CHEST - 1 VIEW  COMPARISON:  Portable exam 1459 hr compared to 0615 hr  FINDINGS: Endotracheal tube has been replaced by tracheostomy tube, tip projecting 7.0 cm above carina.  Nasogastric tube extends into stomach.  LEFT jugular central venous catheter tip projects over SVC.  Normal heart size, mediastinal contours and pulmonary vascularity.  Persistent atelectasis RIGHT lower lobe.  Mild persistent medial LEFT lower lobe infiltrate.  Remaining lungs grossly clear.  No pleural effusion or pneumothorax.  IMPRESSION: New tracheostomy tube.  Persistent RIGHT lower lobe atelectasis and medial LEFT lower lobe infiltrate.   Electronically Signed   By: Lavonia Dana M.D.   On: 07/20/2013 15:18   Dg Chest Port 1 View  07/20/2013   CLINICAL DATA:  Intubated patient.  EXAM: PORTABLE CHEST - 1 VIEW  COMPARISON:  Single view of the chest 07/19/2013 and 07/18/2013.  FINDINGS: Support tubes are unchanged. Right worse than left basilar airspace disease shows marked  improvement. There is no pneumothorax. Heart size is normal.  IMPRESSION: Marked improvement in bibasilar airspace disease. No new abnormality.   Electronically Signed   By: Inge Rise M.D.   On: 07/20/2013 08:05   Dg Abd Portable 1v  07/20/2013   CLINICAL DATA:  Status post tracheotomy.  EXAM: PORTABLE ABDOMEN - 1 VIEW  COMPARISON:  Chest in two views abdomen 10/18/2008.  FINDINGS: Feeding tube is in place with the tip in the body of the stomach directed toward the duodenum. Bowel gas pattern is unremarkable. No abnormal abdominal calcification or focal bony abnormality is seen. Rectal temperature probe is noted.  IMPRESSION: Feeding tube tip is in the body of the stomach.  No acute finding.   Electronically Signed   By: Inge Rise M.D.   On: 07/20/2013 15:17    MEDICATIONS                                                                                                                        Scheduled: . albuterol  3 mL Inhalation Q6H  . antiseptic oral rinse  15 mL Mouth Rinse QID  . aspirin  81 mg Oral Daily  . atorvastatin  80 mg Oral q1800  . chlorhexidine  15 mL Mouth Rinse BID  . divalproex  500 mg Oral 3 times per day  . etomidate  40 mg Intravenous Once  . feeding supplement (PRO-STAT SUGAR FREE 64)  60 mL Per Tube BID  . feeding supplement (VITAL 1.5 CAL)  1,000 mL Per Tube Q24H  . folic acid  1 mg Per Tube Daily  . free water  200 mL Per Tube 3 times per day  . furosemide  40 mg Per Tube Daily  . heparin subcutaneous  5,000 Units Subcutaneous 3 times per day  . hydrALAZINE  25 mg Oral 3 times per day  . insulin aspart  2-6 Units Subcutaneous 6 times per day  . levETIRAcetam (KEPPRA) IVPB  1,000 mg Intravenous Q12H  . metoprolol tartrate  25 mg Oral BID  . pantoprazole (PROTONIX) IV  40 mg Intravenous QHS  . thiamine  100 mg Per Tube Daily  . ticagrelor  90 mg Oral BID    ASSESSMENT/PLAN:                                                                                                            Post cardiac arrest anoxic encephalopathy with symptomatic seizures that are currently well controlled on keppra and depakote. Will try to achieve seizure control with monotherapy, thus will start weaning off Depakote. Neurocognitive status remains essentially unchanged. Will follow up.   Dorian Pod, MD [facility] [phone]  07/21/2013, 8:56 AM

## 2013-07-22 LAB — GLUCOSE, CAPILLARY
Glucose-Capillary: 125 mg/dL — ABNORMAL HIGH (ref 70–99)
Glucose-Capillary: 117 mg/dL — ABNORMAL HIGH (ref 70–99)
Glucose-Capillary: 127 mg/dL — ABNORMAL HIGH (ref 70–99)
Glucose-Capillary: 133 mg/dL — ABNORMAL HIGH (ref 70–99)
Glucose-Capillary: 136 mg/dL — ABNORMAL HIGH (ref 70–99)
Glucose-Capillary: 133 mg/dL — ABNORMAL HIGH (ref 70–99)

## 2013-07-22 LAB — CBC
HCT: 34.1 % — ABNORMAL LOW (ref 39.0–52.0)
Hemoglobin: 10.9 g/dL — ABNORMAL LOW (ref 13.0–17.0)
MCH: 32.2 pg (ref 26.0–34.0)
MCHC: 32 g/dL (ref 30.0–36.0)
MCV: 100.9 fL — AB (ref 78.0–100.0)
PLATELETS: 795 10*3/uL — AB (ref 150–400)
RBC: 3.38 MIL/uL — ABNORMAL LOW (ref 4.22–5.81)
RDW: 15.6 % — AB (ref 11.5–15.5)
WBC: 16.6 10*3/uL — ABNORMAL HIGH (ref 4.0–10.5)

## 2013-07-22 LAB — BASIC METABOLIC PANEL
Anion gap: 18 — ABNORMAL HIGH (ref 5–15)
BUN: 21 mg/dL (ref 6–23)
CHLORIDE: 102 meq/L (ref 96–112)
CO2: 23 mEq/L (ref 19–32)
CREATININE: 0.49 mg/dL — AB (ref 0.50–1.35)
Calcium: 8.6 mg/dL (ref 8.4–10.5)
Glucose, Bld: 129 mg/dL — ABNORMAL HIGH (ref 70–99)
Potassium: 3.6 mEq/L — ABNORMAL LOW (ref 3.7–5.3)
Sodium: 143 mEq/L (ref 137–147)

## 2013-07-22 MED ORDER — POTASSIUM CHLORIDE 10 MEQ/50ML IV SOLN
INTRAVENOUS | Status: AC
  Administered 2013-07-22: 10 meq via INTRAVENOUS
  Filled 2013-07-22: qty 100

## 2013-07-22 MED ORDER — POTASSIUM CHLORIDE 10 MEQ/50ML IV SOLN
10.0000 meq | INTRAVENOUS | Status: AC
  Administered 2013-07-22 (×2): 10 meq via INTRAVENOUS

## 2013-07-22 MED ORDER — FUROSEMIDE 10 MG/ML PO SOLN
40.0000 mg | Freq: Every day | ORAL | Status: DC
  Administered 2013-07-22 – 2013-07-23 (×2): 40 mg
  Filled 2013-07-22 (×3): qty 4

## 2013-07-22 NOTE — Progress Notes (Signed)
[facility] ADULT ICU REPLACEMENT PROTOCOL FOR AM LAB REPLACEMENT ONLY  The patient does apply for the [facility] Adult ICU Electrolyte Replacment Protocol based on the criteria listed below:   1. Is GFR >/= 40 ml/min? Yes.    Patient's GFR today is >90 2. Is urine output >/= 0.5 ml/kg/hr for the last 6 hours? Yes.   Patient's UOP is 0.9 ml/kg/hr 3. Is BUN < 60 mg/dL? Yes.    Patient's BUN today is 21 4. Abnormal electrolyte(s): K 3.6 5. Ordered repletion with: per protocol 6. If a panic level lab has been reported, has the CCM MD in charge been notified? No..   Physician:    Ronda Fairly A 07/22/2013 5:06 AM

## 2013-07-22 NOTE — Progress Notes (Signed)
PULMONARY / CRITICAL CARE MEDICINE  Name: Chase Blackwell MRN: [id number] DOB: 1965/06/24  ADMISSION DATE:  07/06/2013  PRIMARY SERVICE: PCCM  CHIEF COMPLAINT:  VF Arrest  CARDIOLOGY: Harwani/Kadakia  BRIEF PATIENT DESCRIPTION:  48 yo admitted 7/3 after VF arrest (downtime 15 mins) and treated for inferior STEMI.  Placed on hypothermia protocol.  Taken to cath lab for PCI with RCA stent placement.  Course complicated by seizures post rewarming.  LINES / TUBES: ETT 7/3 >>> 7/17 Trach (JY) 7/17 >>> R IJ CVL 7/4 >>> 7/9 L IJ CVL 7/9 >>> L R AL 7/4 >>> 7/10  CULTURES: 7/3  Urine 7/3 >>> neg 7/3  MRSA PCR >>> neg 7/6  Respiratory >>> KLEBSIELLA  7/6  Blood >>> coag neg staph 1/2  ANTIBIOTICS: Unasyn 7/6 >>>7/15 Vancomycin 7/7 >>>7/8  SIGNIFICANT EVENTS / STUDIES:  7/3    Cath lab >>> RCA stent 7/3    Hypothermia protocol initiated 7/5    CT Head >>> nad 7/5    EEG >>> minimal activity 7/6    Rewarmed 7/8    MRI brain >>> nad 7/13  Seizure noted 7/17  Tracheostomy 7/18  Trach collar as tolerated 7/19  Trach collar x 24 hours, vent d/c'd  INTERVAL HISTORY:  Remained on trach collar, no distress, intermittent tachypnea.  VITAL SIGNS: Temp:  [99.2 F (37.3 C)-100.9 F (38.3 C)] 99.6 F (37.6 C) (07/19 1117) Pulse Rate:  [50-102] 90 (07/19 1117) Cardiac Rhythm:  [-] Normal sinus rhythm (07/19 0800) Resp:  [13-32] 26 (07/19 1117) BP: (131-177)/(51-101) 133/64 mmHg (07/19 1117) SpO2:  [92 %-99 %] 95 % (07/19 1117) FiO2 (%):  [40 %] 40 % (07/19 1117) Weight:  [70.4 kg (155 lb 3.3 oz)] 70.4 kg (155 lb 3.3 oz) (07/19 0125)  VENTILATOR SETTINGS: Vent Mode:  [-]  FiO2 (%):  [40 %] 40 %  INTAKE / OUTPUT: I/O last 3 completed shifts: In: 2936.9 [I.V.:951.9; NG/GT:1605; IV Piggyback:380] Out: 5620 [Urine:5620]   PHYSICAL EXAMINATION: General:  No distress Neuro:  Encephalopathic, at times agitated HEENT:  PERRL, tracheostomy site intact Cardiovascular:   Regular Lungs:  Rhonchi Abdomen:  Soft, nontender, bowel sounds diminished Musculoskeletal:  Moves all extremities, no edema Skin:  Intact  LABS: CBC  Recent Labs Lab 07/20/13 0447 07/21/13 0428 07/22/13 0225  WBC 21.9* 18.0* 16.6*  HGB 10.5* 10.3* 10.9*  HCT 32.2* 32.4* 34.1*  PLT 616* 671* 795*   Coag's  Recent Labs Lab 07/20/13 0447  APTT 29  INR 1.09   BMET  Recent Labs Lab 07/20/13 0447 07/21/13 0428 07/22/13 0225  NA 145 144 143  K 4.6 3.9 3.6*  CL 105 105 102  CO2 25 24 23   BUN 31* 29* 21  CREATININE 0.83 0.70 0.49*  GLUCOSE 99 125* 129*   Electrolytes  Recent Labs Lab 07/19/13 0500 07/20/13 0447 07/21/13 0428 07/22/13 0225  CALCIUM 8.4 8.4 8.5 8.6  MG 2.6* 2.7* 2.7*  --   PHOS 5.7* 6.9* 4.7*  --    Sepsis Markers No results found for this basename: LATICACIDVEN, PROCALCITON, O2SATVEN,  in the last 168 hours  ABG  Recent Labs Lab 07/19/13 0350 07/20/13 0254 07/21/13 0500  PHART 7.486* 7.444 7.455*  PCO2ART 33.7* 36.6 32.9*  PO2ART 76.8* 79.9* 74.5*   Liver Enzymes No results found for this basename: AST, ALT, ALKPHOS, BILITOT, ALBUMIN,  in the last 168 hours  Cardiac Enzymes No results found for this basename: TROPONINI, PROBNP,  in the last 168 hours  Glucose  Recent Labs Lab 07/21/13 1502 07/21/13 1946 07/22/13 0002 07/22/13 0352 07/22/13 0808 07/22/13 1130  GLUCAP 112* 128* 117* 125* 127* 133*   IMAGING: Dg Chest Port 1 View  07/21/2013   CLINICAL DATA:  Recent placed tracheostomy tube.  Infiltrates.  EXAM: PORTABLE CHEST - 1 VIEW  COMPARISON:  07/20/2013  FINDINGS: Right lower lobe opacity has improved, clearing peripherally but persisting centrally. Milder opacity in the left medial lung base has shown a more subtle improvement. No new lung opacities. No pleural effusion. No pneumothorax.  Tracheostomy tube in left internal jugular central venous line are stable and well positioned. Enteric tube passes below the  diaphragm into the stomach and below the included field of view.  IMPRESSION: 1. Improved lung base opacities, most notable on the right. 2. Support apparatus is stable in well positioned.   Electronically Signed   By: Lajean Manes M.D.   On: 07/21/2013 07:24   Chest Portable 1 View To Assess Tube Placement And Rule-out Pneumothorax  07/20/2013   CLINICAL DATA:  Post tracheotomy  EXAM: PORTABLE CHEST - 1 VIEW  COMPARISON:  Portable exam 1459 hr compared to 0615 hr  FINDINGS: Endotracheal tube has been replaced by tracheostomy tube, tip projecting 7.0 cm above carina.  Nasogastric tube extends into stomach.  LEFT jugular central venous catheter tip projects over SVC.  Normal heart size, mediastinal contours and pulmonary vascularity.  Persistent atelectasis RIGHT lower lobe.  Mild persistent medial LEFT lower lobe infiltrate.  Remaining lungs grossly clear.  No pleural effusion or pneumothorax.  IMPRESSION: New tracheostomy tube.  Persistent RIGHT lower lobe atelectasis and medial LEFT lower lobe infiltrate.   Electronically Signed   By: Lavonia Dana M.D.   On: 07/20/2013 15:18   Dg Abd Portable 1v  07/20/2013   CLINICAL DATA:  Status post tracheotomy.  EXAM: PORTABLE ABDOMEN - 1 VIEW  COMPARISON:  Chest in two views abdomen 10/18/2008.  FINDINGS: Feeding tube is in place with the tip in the body of the stomach directed toward the duodenum. Bowel gas pattern is unremarkable. No abnormal abdominal calcification or focal bony abnormality is seen. Rectal temperature probe is noted.  IMPRESSION: Feeding tube tip is in the body of the stomach.  No acute finding.   Electronically Signed   By: Inge Rise M.D.   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 P:   Goal pH>7.30, SpO2>92 Trach collar as tolerated D/c vent Albuterol  CARDIOVASCULAR A:  VF arrest STEMI s/p stent to RCA HTN P:  Cardiology  following ASA, Brilinta, Lipitor Metoprolol, Hydralazine Labetalol PRN  RENAL A: Hypernatremia resolved P: Trend BMP Lasix 40  K 10 x 2  INFECTIOUS A: Pneumonia P: Completed abx  GASTROINTESTINAL A:   Nutrition GERD P:   NPO TF Protonix  HEMATOLOGIC A: VTE Px P: Trend CBC Heparin   ENDOCRINE A: Hypo / hyperglycemia P: SSI  NEUROLOGIC A: Acute encephalopathy Possible anoxia Schizophrenia Seziure 7/13 P: Neurology following Keppra Depakote Fentanyl PRN Thiamin / Folate  I have personally obtained history, examined patient, evaluated and interpreted laboratory and imaging results, reviewed medical records, formulated assessment / plan and placed orders.  Doree Fudge, MD [facility] [place] Pager: [phone]  07/22/2013, 12:46 PM

## 2013-07-23 ENCOUNTER — Inpatient Hospital Stay
Admission: AD | Admit: 2013-07-23 | Discharge: 2013-08-29 | Disposition: A | Discharge status: Skilled Nursing Facility | Payer: Self-pay | Source: Ambulatory Visit | Attending: Internal Medicine | Admitting: Internal Medicine

## 2013-07-23 ENCOUNTER — Other Ambulatory Visit (HOSPITAL_COMMUNITY): Payer: Self-pay

## 2013-07-23 DIAGNOSIS — J9600 Acute respiratory failure, unspecified whether with hypoxia or hypercapnia: Secondary | ICD-10-CM

## 2013-07-23 DIAGNOSIS — R4182 Altered mental status, unspecified: Secondary | ICD-10-CM

## 2013-07-23 DIAGNOSIS — E46 Unspecified protein-calorie malnutrition: Secondary | ICD-10-CM

## 2013-07-23 LAB — PHOSPHORUS: PHOSPHORUS: 3.9 mg/dL (ref 2.3–4.6)

## 2013-07-23 LAB — CBC
HCT: 36 % — ABNORMAL LOW (ref 39.0–52.0)
HCT: 38 % — ABNORMAL LOW (ref 39.0–52.0)
HEMOGLOBIN: 11.9 g/dL — AB (ref 13.0–17.0)
Hemoglobin: 12.3 g/dL — ABNORMAL LOW (ref 13.0–17.0)
MCH: 33 pg (ref 26.0–34.0)
MCH: 32.8 pg (ref 26.0–34.0)
MCHC: 33.1 g/dL (ref 30.0–36.0)
MCHC: 32.4 g/dL (ref 30.0–36.0)
MCV: 99.7 fL (ref 78.0–100.0)
MCV: 101.3 fL — ABNORMAL HIGH (ref 78.0–100.0)
PLATELETS: 936 10*3/uL — AB (ref 150–400)
Platelets: 922 10*3/uL (ref 150–400)
RBC: 3.61 MIL/uL — AB (ref 4.22–5.81)
RBC: 3.75 MIL/uL — ABNORMAL LOW (ref 4.22–5.81)
RDW: 15.4 % (ref 11.5–15.5)
RDW: 15.5 % (ref 11.5–15.5)
WBC: 14.9 10*3/uL — AB (ref 4.0–10.5)
WBC: 15.3 10*3/uL — AB (ref 4.0–10.5)

## 2013-07-23 LAB — GLUCOSE, CAPILLARY
GLUCOSE-CAPILLARY: 155 mg/dL — AB (ref 70–99)
GLUCOSE-CAPILLARY: 131 mg/dL — AB (ref 70–99)
GLUCOSE-CAPILLARY: 124 mg/dL — AB (ref 70–99)
Glucose-Capillary: 119 mg/dL — ABNORMAL HIGH (ref 70–99)

## 2013-07-23 LAB — PATHOLOGIST SMEAR REVIEW

## 2013-07-23 LAB — BASIC METABOLIC PANEL
ANION GAP: 18 — AB (ref 5–15)
BUN: 26 mg/dL — ABNORMAL HIGH (ref 6–23)
CHLORIDE: 104 meq/L (ref 96–112)
CO2: 24 meq/L (ref 19–32)
Calcium: 9.1 mg/dL (ref 8.4–10.5)
Creatinine, Ser: 0.55 mg/dL (ref 0.50–1.35)
GFR calc Af Amer: >90 mL/min (ref >90)
GFR calc non Af Amer: >90 mL/min (ref >90)
GLUCOSE: 120 mg/dL — AB (ref 70–99)
POTASSIUM: 3.8 meq/L (ref 3.7–5.3)
SODIUM: 146 meq/L (ref 137–147)

## 2013-07-23 LAB — MAGNESIUM: Magnesium: 2.5 mg/dL (ref 1.5–2.5)

## 2013-07-23 MED ORDER — CHLORHEXIDINE GLUCONATE 0.12 % MT SOLN: 15.0000 mL | Freq: Two times a day (BID) | OROMUCOSAL | Status: DC

## 2013-07-23 MED ORDER — METOPROLOL TARTRATE 25 MG PO TABS: 25.0000 mg | ORAL_TABLET | Freq: Two times a day (BID) | ORAL | Status: DC

## 2013-07-23 MED ORDER — BIOTENE DRY MOUTH MT LIQD: 15.0000 mL | Freq: Four times a day (QID) | OROMUCOSAL | Status: DC

## 2013-07-23 MED ORDER — HEPARIN SODIUM (PORCINE) 5000 UNIT/ML IJ SOLN: 5000.0000 [IU] | Freq: Three times a day (TID) | INTRAMUSCULAR | Status: DC

## 2013-07-23 MED ORDER — INSULIN ASPART 100 UNIT/ML ~~LOC~~ SOLN: 2.0000 [IU] | SUBCUTANEOUS | Status: DC

## 2013-07-23 MED ORDER — PRO-STAT SUGAR FREE PO LIQD: 60.0000 mL | Freq: Two times a day (BID) | ORAL | Status: DC

## 2013-07-23 MED ORDER — ATORVASTATIN CALCIUM 80 MG PO TABS: 80.0000 mg | ORAL_TABLET | Freq: Every day | ORAL | Status: DC

## 2013-07-23 MED ORDER — THIAMINE HCL 100 MG PO TABS: 100.0000 mg | ORAL_TABLET | Freq: Every day | ORAL | Status: DC

## 2013-07-23 MED ORDER — ASPIRIN 81 MG PO CHEW: 81.0000 mg | CHEWABLE_TABLET | Freq: Every day | ORAL | Status: DC

## 2013-07-23 MED ORDER — FOLIC ACID 1 MG PO TABS: 1.0000 mg | ORAL_TABLET | Freq: Every day | ORAL | Status: DC

## 2013-07-23 MED ORDER — DIVALPROEX SODIUM 125 MG PO CPSP: 250.0000 mg | ORAL_CAPSULE | Freq: Three times a day (TID) | ORAL | Status: DC

## 2013-07-23 MED ORDER — HYDRALAZINE HCL 25 MG PO TABS: 25.0000 mg | ORAL_TABLET | Freq: Three times a day (TID) | ORAL | Status: DC

## 2013-07-23 MED ORDER — VITAL 1.5 CAL PO LIQD: ORAL | Status: DC

## 2013-07-23 MED ORDER — PANTOPRAZOLE SODIUM 40 MG IV SOLR: 40.0000 mg | Freq: Every day | INTRAVENOUS | Status: DC

## 2013-07-23 MED ORDER — ALBUTEROL SULFATE (2.5 MG/3ML) 0.083% IN NEBU: 3.0000 mL | INHALATION_SOLUTION | Freq: Four times a day (QID) | RESPIRATORY_TRACT | Status: DC

## 2013-07-23 MED ORDER — SODIUM CHLORIDE 0.9 % IV SOLN: 1000.0000 mg | Freq: Two times a day (BID) | INTRAVENOUS | Status: DC

## 2013-07-23 MED ORDER — LABETALOL HCL 5 MG/ML IV SOLN: 10.0000 mg | Freq: Four times a day (QID) | INTRAVENOUS | Status: DC | PRN

## 2013-07-23 MED ORDER — FUROSEMIDE 10 MG/ML PO SOLN: 40.0000 mg | Freq: Every day | ORAL | Status: DC

## 2013-07-23 MED ORDER — TICAGRELOR 90 MG PO TABS: 90.0000 mg | ORAL_TABLET | Freq: Two times a day (BID) | ORAL | Status: DC

## 2013-07-23 NOTE — Progress Notes (Signed)
eLink Physician-Brief Progress Note Patient Name: Chase Blackwell DOB: 01-25-1965 MRN: [id number]  Date of Service  07/23/2013   HPI/Events of Note   Fever 101.8 after tylenol  eICU Interventions  Cultures obtained > blood, urine, resp Abx deferred for now   Intervention Category Intermediate Interventions: Infection - evaluation and management  Halim Surrette S. 07/23/2013, 12:08 AM

## 2013-07-23 NOTE — Discharge Summary (Signed)
Physician Discharge Summary  Patient ID: Chase Blackwell MRN: [id number] DOB/AGE: 1965-04-20 48 y.o.  Admit date: 07/06/2013 Discharge date: 07/23/2013    Discharge Diagnoses:  Acute Respiratory Failure in setting of Cardiac Arrest Asthma  HCAP Tracheostomy Status VF Arrest  STEMI s/p Stent to RCA HTN  Hypernatremia Hypokalemia  GERD Protein Calorie Malnutrition  Hypo/Hyperglycemia  Acute Encephalopathy  Concern for Anoxia  Schizophrenia  Seizures  Fever                                                                       DISCHARGE PLAN BY DIAGNOSIS     Acute Respiratory Failure in setting of Cardiac Arrest Asthma  HCAP Tracheostomy Status  Discharge Plan: -continue ATC as tolerated, wean oxygen for sats of 90-95% -scheduled Albuterol -mobilize / PT as tolerated  -PRN CXR  -monitor fever curve / leukocytosis off abx, completed therapy for HCAP -trach care daily & PRN  -follow pending cultures   VF Arrest  STEMI s/p Stent to RCA HTN   Discharge Plan: -Continue ASA, Brilinta, Lipitor -Continue Metoprolol, Hydralazine / Labetalol PRN   Hypernatremia Hypokalemia   Discharge Plan: -Hypernatremia resolved.  -PRN BMP to monitor electrolytes.  -Replace K as indicated  GERD Protein Calorie Malnutrition   Discharge Plan: -continue PPI -TF at 35 ml/hr (Vital)  Hypo/Hyperglycemia   Discharge Plan: -SSI  Acute Encephalopathy  Concern for Anoxia  Schizophrenia  Seizures  Discharge Plan: -Continue Keppra -Neurology recommendation to wean Depakote to off to achieve seizure control with monotherapy -Thiamin / Folate -Will need Neurology follow up post discharge    Fever  Discharge Plan: -consider removal of TLC & placement of PICC line if long term therapies needed -follow cultures from 7/19 PM                 DISCHARGE SUMMARY   Chase Blackwell is a 48 y.o. y/o male with a PMH of HTN, Asthma, GERD, Schizoaffective disorder, ETOH Abuse, and  chronic pain syndrome who was found by his neighbor on 7/3 in [address] unresponsive.  He presented to the ER in VF arrest with an inferior MI.  Patient estimated downtime was 15 minutes.  He was placed on hypothermia protocol and taken to cath lab for PCI.  He received an RCA stent placement and was subsequently admitted to ICU.   He was further evaluated with a CT of the head and EEG.   CT of head was negative and EEG noted minimal activity.  The patient was rewarmed on 07/09/13.  Follow up MRI on 7/8 was also negative.  After the rewarming phase, he was noted to have seizure like activity.  He was evaluated by Neurology who recommended Depakote & Keppra.  Due to prolonged ICU need and difficulty weaning, he underwent placement of tracheostomy.  Post placement of tracheostomy, he was able to tolerate  ATC on 7/18 and has remained off mechanical ventilation.  The patients mental status currently varies from being awake but not following commands to periods of intermittent agitation.  7/19 the patient developed fever of 101.8 and was pan cultures.  ABX were held as no clear source.  See cultures as below, they will need to be followed to final results.  See instructions as above for details.       LINES / TUBES:  ETT 7/3 >>> 7/17  Trach (JY) 7/17 >>>  R IJ CVL 7/4 >>> 7/9  L IJ CVL 7/9 >>>  L R AL 7/4 >>> 7/10   CULTURES:  7/3 Urine 7/3 >>> neg  7/3 MRSA PCR >>> neg  7/6 Respiratory >>> KLEBSIELLA  7/6 Blood >>> coag neg staph 1/2  7/20 BCx2 >> 7/20 UC >> 7/20 Sputum >>  ANTIBIOTICS:  Unasyn 7/6 >>>7/15  Vancomycin 7/7 >>>7/8   SIGNIFICANT EVENTS / STUDIES:  7/3 Cath lab >>> RCA stent  7/3 Hypothermia protocol initiated  7/5 CT Head >>> nad  7/5 EEG >>> minimal activity  7/6 Rewarmed  7/8 MRI brain >>> nad  7/13 Seizure noted  7/17 Tracheostomy  7/18 Trach collar as tolerated  7/19 Trach collar x 24 hours, vent d/c'd  Discharge Exam: General: No distress  Neuro: Encephalopathic,  at times agitated  HEENT: PERRL, tracheostomy site intact  Cardiovascular: Regular  Lungs: Rhonchi  Abdomen: Soft, nontender, bowel sounds diminished  Musculoskeletal: Moves all extremities, no edema  Skin: Intact   Filed Vitals:   07/23/13 0441 07/23/13 0729 07/23/13 0803 07/23/13 0816  BP:   132/68   Pulse: 93 102 107   Temp:   99.4 F (37.4 C)   TempSrc:   Oral   Resp:   20   Height:      Weight: 145 lb 8.1 oz (66 kg)     SpO2: 94% 95% 97% 99%     Discharge Labs  BMET  Recent Labs Lab 07/18/13 0345 07/19/13 0500 07/20/13 0447 07/21/13 0428 07/22/13 0225 07/23/13 0055  NA 144 146 145 144 143 146  K 3.9 3.7 4.6 3.9 3.6* 3.8  CL 105 104 105 105 102 104  CO2 _0 GLUCOSE 121* 136* 99 125* 129* 120*  BUN 21 22 31* 29* 21 26*  CREATININE 0.70 0.73 0.83 0.70 0.49* 0.55  CALCIUM 8.3* 8.4 8.4 8.5 8.6 9.1  MG 2.4 2.6* 2.7* 2.7*  --  2.5  PHOS 5.1* 5.7* 6.9* 4.7*  --  3.9   CBC  Recent Labs Lab 07/21/13 0428 07/22/13 0225 07/23/13 0055  HGB 10.3* 10.9* 11.9*  HCT 32.4* 34.1* 36.0*  WBC 18.0* 16.6* 15.3*  PLT 671* 795* 922*   Anti-Coagulation  Recent Labs Lab 07/20/13 0447  INR 1.09     Discharge Instructions   Discharge instructions    Complete by:  As directed   Leave all current tubes / lines in place for transfer.     Increase activity slowly    Complete by:  As directed              Medication List    STOP taking these medications       albuterol 108 (90 BASE) MCG/ACT inhaler  Commonly known as:  PROVENTIL HFA;VENTOLIN HFA  Replaced by:  albuterol (2.5 MG/3ML) 0.083% nebulizer solution     benztropine 0.5 MG tablet  Commonly known as:  COGENTIN     haloperidol decanoate 100 MG/ML injection  Commonly known as:  HALDOL DECANOATE     PARoxetine 40 MG tablet  Commonly known as:  PAXIL     traZODone 100 MG tablet  Commonly known as:  DESYREL      TAKE these medications       acetaminophen 325 MG tablet   Commonly known as:  TYLENOL  Take 650 mg by mouth every 6 (six) hours as needed for mild pain.     albuterol (2.5 MG/3ML) 0.083% nebulizer solution  Commonly known as:  PROVENTIL  Inhale 3 mLs into the lungs every 6 (six) hours.     antiseptic oral rinse Liqd  15 mLs by Mouth Rinse route QID.     aspirin 81 MG chewable tablet  Chew 1 tablet (81 mg total) by mouth daily.     atorvastatin 80 MG tablet  Commonly known as:  LIPITOR  Take 1 tablet (80 mg total) by mouth daily at 6 PM.     chlorhexidine 0.12 % solution  Commonly known as:  PERIDEX  15 mLs by Mouth Rinse route 2 (two) times daily.     divalproex 125 MG capsule  Commonly known as:  DEPAKOTE SPRINKLE  Take 2 capsules (250 mg total) by mouth every 8 (eight) hours.     feeding supplement (PRO-STAT SUGAR FREE 64) Liqd  Place 60 mLs into feeding tube 2 (two) times daily.     feeding supplement (VITAL 1.5 CAL) Liqd  Vital 1.5 CAL at 35 ml /hr per feeding tube.     folic acid 1 MG tablet  Commonly known as:  FOLVITE  Place 1 tablet (1 mg total) into feeding tube daily.     furosemide 10 MG/ML solution  Commonly known as:  LASIX  Place 4 mLs (40 mg total) into feeding tube daily.     heparin 5000 UNIT/ML injection  Inject 1 mL (5,000 Units total) into the skin every 8 (eight) hours.     hydrALAZINE 25 MG tablet  Commonly known as:  APRESOLINE  Take 1 tablet (25 mg total) by mouth every 8 (eight) hours.     insulin aspart 100 UNIT/ML injection  Commonly known as:  novoLOG  - Inject 2-6 Units into the skin every 4 (four) hours. Correction coverage: Standard Scale  - CBG < 70: implement Hypoglycemia Protocol  - CBG 121 - 150: 2 units  - CBG 151 - 200: 4 units  - CBG 201 - 250: 6 units     labetalol 5 MG/ML injection  Commonly known as:  NORMODYNE,TRANDATE  Inject 2-4 mLs (10-20 mg total) into the vein every 6 (six) hours as needed (For syst BP >160).     levETIRAcetam 1,000 mg in sodium chloride 0.9 % 100  mL  Inject 1,000 mg into the vein every 12 (twelve) hours.     metoprolol tartrate 25 MG tablet  Commonly known as:  LOPRESSOR  Take 1 tablet (25 mg total) by mouth 2 (two) times daily.     pantoprazole 40 MG injection  Commonly known as:  PROTONIX  Inject 40 mg into the vein at bedtime.     thiamine 100 MG tablet  Place 1 tablet (100 mg total) into feeding tube daily.     ticagrelor 90 MG Tabs tablet  Commonly known as:  BRILINTA  Take 1 tablet (90 mg total) by mouth 2 (two) times daily.        Disposition:   Discharged Condition: Chase Blackwell has met maximum benefit of inpatient care and is medically stable and cleared for discharge.  Patient is pending follow up as above.      Time spent on disposition:  Greater than 35 minutes.   Signed: Noe Gens, NP-C [place] [facility] Pgr: [phone] Office: [phone]

## 2013-07-23 NOTE — Progress Notes (Signed)
Report given to receiving nurse @ [facility].

## 2013-07-24 ENCOUNTER — Other Ambulatory Visit (HOSPITAL_COMMUNITY): Payer: Self-pay

## 2013-07-24 LAB — COMPREHENSIVE METABOLIC PANEL
ALK PHOS: 177 U/L — AB (ref 39–117)
ALT: 76 U/L — ABNORMAL HIGH (ref 0–53)
AST: 56 U/L — AB (ref 0–37)
Albumin: 3 g/dL — ABNORMAL LOW (ref 3.5–5.2)
Anion gap: 13 (ref 5–15)
BILIRUBIN TOTAL: 0.7 mg/dL (ref 0.3–1.2)
BUN: 28 mg/dL — ABNORMAL HIGH (ref 6–23)
CHLORIDE: 107 meq/L (ref 96–112)
CO2: 25 mEq/L (ref 19–32)
Calcium: 9.2 mg/dL (ref 8.4–10.5)
Creatinine, Ser: 0.55 mg/dL (ref 0.50–1.35)
GFR calc Af Amer: >90 mL/min (ref >90)
GFR calc non Af Amer: >90 mL/min (ref >90)
GLUCOSE: 135 mg/dL — AB (ref 70–99)
Potassium: 3.4 mEq/L — ABNORMAL LOW (ref 3.7–5.3)
Sodium: 145 mEq/L (ref 137–147)
Total Protein: 7.8 g/dL (ref 6.0–8.3)

## 2013-07-24 LAB — URINE MICROSCOPIC-ADD ON

## 2013-07-24 LAB — URINALYSIS, ROUTINE W REFLEX MICROSCOPIC
Bilirubin Urine: NEGATIVE
GLUCOSE, UA: NEGATIVE mg/dL
HGB URINE DIPSTICK: NEGATIVE
KETONES UR: NEGATIVE mg/dL
Nitrite: POSITIVE — AB
PROTEIN: NEGATIVE mg/dL
Specific Gravity, Urine: 1.026 (ref 1.005–1.030)
Urobilinogen, UA: 2 mg/dL — ABNORMAL HIGH (ref 0.0–1.0)
pH: 6 (ref 5.0–8.0)

## 2013-07-24 LAB — BLOOD GAS, ARTERIAL
Acid-Base Excess: 2 mmol/L (ref 0.0–2.0)
Bicarbonate: 25.5 mEq/L — ABNORMAL HIGH (ref 20.0–24.0)
FIO2: 0.4 %
O2 Saturation: 95.9 %
PCO2 ART: 36.1 mmHg (ref 35.0–45.0)
PO2 ART: 81.3 mmHg (ref 80.0–100.0)
Patient temperature: 98.7
TCO2: 26.6 mmol/L (ref 0–100)
pH, Arterial: 7.463 — ABNORMAL HIGH (ref 7.350–7.450)

## 2013-07-24 LAB — CBC WITH DIFFERENTIAL/PLATELET
BASOS ABS: 0.1 10*3/uL (ref 0.0–0.1)
BASOS PCT: 1 % (ref 0–1)
EOS ABS: 0.3 10*3/uL (ref 0.0–0.7)
EOS PCT: 2 % (ref 0–5)
HCT: 35.7 % — ABNORMAL LOW (ref 39.0–52.0)
Hemoglobin: 11.5 g/dL — ABNORMAL LOW (ref 13.0–17.0)
LYMPHS ABS: 1.5 10*3/uL (ref 0.7–4.0)
Lymphocytes Relative: 11 % — ABNORMAL LOW (ref 12–46)
MCH: 32.9 pg (ref 26.0–34.0)
MCHC: 32.2 g/dL (ref 30.0–36.0)
MCV: 102 fL — AB (ref 78.0–100.0)
Monocytes Absolute: 1.9 10*3/uL — ABNORMAL HIGH (ref 0.1–1.0)
Monocytes Relative: 13 % — ABNORMAL HIGH (ref 3–12)
Neutro Abs: 10.2 10*3/uL — ABNORMAL HIGH (ref 1.7–7.7)
Neutrophils Relative %: 73 % (ref 43–77)
Platelets: 931 10*3/uL (ref 150–400)
RBC: 3.5 MIL/uL — ABNORMAL LOW (ref 4.22–5.81)
RDW: 15.4 % (ref 11.5–15.5)
WBC: 14 10*3/uL — ABNORMAL HIGH (ref 4.0–10.5)

## 2013-07-24 LAB — URINE CULTURE
CULTURE: NO GROWTH
Colony Count: NO GROWTH
Special Requests: NORMAL

## 2013-07-24 LAB — TSH: TSH: 0.856 u[IU]/mL (ref 0.350–4.500)

## 2013-07-24 LAB — PREALBUMIN: PREALBUMIN: 15.6 mg/dL — AB (ref 17.0–34.0)

## 2013-07-24 NOTE — Progress Notes (Signed)
[facility]                                                                                              Progress note     Patient Demographics  Chase Blackwell, is a 48 y.o. male  AOZ:[id number]  NGE:[id number]  DOB - 02/20/1965  Admit date - 07/23/2013  Admitting Physician Merton Border, MD  Outpatient Primary MD for the patient is Default, Provider, MD  LOS - 1   Chief complaint   Respiratory failure   Encephalopathy with anoxic brain injury   Protein calorie malnutrition         Subjective:   Conn Reininger cannot give any history  Objective:   Vital signs  Temperature 100.1 Heart rate 97 Respiratory rate 20 Blood pressure 122/70 Pulse ox 97%    Exam Encephalopathic [place].AT, pupils 2 mm bilaterally with sluggish reaction to light NG tube in place Supple Neck,No JVD, No cervical lymphadenopathy appriciated. Tracheostomy in place Symmetrical Chest wall movement, decreased breath sounds bilaterally with scattered rhonchi RRR,No Gallops,Rubs or new Murmurs, No Parasternal Heave +ve B.Sounds, Abd Soft, Non tender, No organomegaly appriciated, No rebound - guarding or rigidity. No Cyanosis, Clubbing or edema, No new Rash or bruise    I&Os positive 115   Data Review   CBC  Recent Labs Lab 07/18/13 0345 07/19/13 0500  07/21/13 0428 07/22/13 0225 07/23/13 0055 07/23/13 1855 07/24/13 0500  WBC 12.5* 12.7*  < > 18.0* 16.6* 15.3* 14.9* 14.0*  HGB 10.1* 10.2*  < > 10.3* 10.9* 11.9* 12.3* 11.5*  HCT 31.3* 32.0*  < > 32.4* 34.1* 36.0* 38.0* 35.7*  PLT 457* 563*  < > 671* 795* 922* 936* 931*  MCV 99.1 99.7  < > 100.9* 100.9* 99.7 101.3* 102.0*  MCH 32.0 31.8  < > 32.1 32.2 33.0 32.8 32.9  MCHC 32.3 31.9  < > 31.8 32.0 33.1 32.4 32.2  RDW 14.9 15.5  < > 16.2* 15.6* 15.4 15.5 15.4  LYMPHSABS 2.0 1.8  --   --   --   --   --  1.5  MONOABS 0.5 0.6  --   --   --   --   --  1.9*   EOSABS 0.6 0.6  --   --   --   --   --  0.3  BASOSABS 0.0 0.0  --   --   --   --   --  0.1  < > = values in this interval not displayed.  Chemistries   Recent Labs Lab 07/18/13 0345 07/19/13 0500 07/20/13 0447 07/21/13 0428 07/22/13 0225 07/23/13 0055 07/24/13 0500  NA 144 146 145 144 143 146 145  K 3.9 3.7 4.6 3.9 3.6* 3.8 3.4*  CL 105 104 105 105 102 104 107  CO2 24 27 25 24 23 24 25   GLUCOSE 121* 136* 99 125* 129* 120* 135*  BUN 21 22 31* 29* 21 26* 28*  CREATININE 0.70 0.73 0.83 0.70 0.49* 0.55 0.55  CALCIUM 8.3* 8.4 8.4 8.5 8.6 9.1 9.2  MG 2.4 2.6* 2.7* 2.7*  --  2.5  --  AST  --   --   --   --   --   --  56*  ALT  --   --   --   --   --   --  76*  ALKPHOS  --   --   --   --   --   --  177*  BILITOT  --   --   --   --   --   --  0.7   ------------------------------------------------------------------------------------------------------------------ CrCl is unknown because both a height and weight (above a minimum accepted value) are required for this calculation. ------------------------------------------------------------------------------------------------------------------ No results found for this basename: HGBA1C,  in the last 72 hours ------------------------------------------------------------------------------------------------------------------ No results found for this basename: CHOL, HDL, LDLCALC, TRIG, CHOLHDL, LDLDIRECT,  in the last 72 hours ------------------------------------------------------------------------------------------------------------------  Recent Labs  07/24/13 0500  TSH 0.856   ------------------------------------------------------------------------------------------------------------------ No results found for this basename: VITAMINB12, FOLATE, FERRITIN, TIBC, IRON, RETICCTPCT,  in the last 72 hours  Coagulation profile  Recent Labs Lab 07/20/13 0447  INR 1.09    No results found for this basename: DDIMER,  in the last 72  hours  Cardiac Enzymes No results found for this basename: CK, CKMB, TROPONINI, MYOGLOBIN,  in the last 168 hours ------------------------------------------------------------------------------------------------------------------ No components found with this basename: POCBNP,   Micro Results Recent Results (from the past 240 hour(s))  CULTURE, BLOOD (ROUTINE X 2)     Status: None   Collection Time    07/23/13 12:55 AM      Result Value Ref Range Status   Specimen Description BLOOD RIGHT ARM   Final   Special Requests BOTTLES DRAWN AEROBIC ONLY [place]   Final   Culture  Setup Time     Final   Value: 07/23/2013 09:02     Performed at [facility]   Culture     Final   Value:        BLOOD CULTURE RECEIVED NO GROWTH TO DATE CULTURE WILL BE HELD FOR 5 DAYS BEFORE ISSUING A FINAL NEGATIVE REPORT     Performed at [facility]   Report Status PENDING   Incomplete  CULTURE, BLOOD (ROUTINE X 2)     Status: None   Collection Time    07/23/13  1:00 AM      Result Value Ref Range Status   Specimen Description BLOOD RIGHT FOREARM   Final   Special Requests BOTTLES DRAWN AEROBIC ONLY 8CC   Final   Culture  Setup Time     Final   Value: 07/23/2013 09:03     Performed at [facility]   Culture     Final   Value:        BLOOD CULTURE RECEIVED NO GROWTH TO DATE CULTURE WILL BE HELD FOR 5 DAYS BEFORE ISSUING A FINAL NEGATIVE REPORT     Performed at [facility]   Report Status PENDING   Incomplete  URINE CULTURE     Status: None   Collection Time    07/23/13  1:27 AM      Result Value Ref Range Status   Specimen Description URINE, CATHETERIZED   Final   Special Requests Normal   Final   Culture  Setup Time     Final   Value: 07/23/2013 01:49     Performed at [place]     Final   Value: NO GROWTH     Performed at [facility]  Culture     Final   Value: NO GROWTH     Performed at [facility]   Report Status  07/24/2013 FINAL   Final  CULTURE, RESPIRATORY (NON-EXPECTORATED)     Status: None   Collection Time    07/23/13  3:09 AM      Result Value Ref Range Status   Specimen Description TRACHEAL ASPIRATE   Final   Special Requests NONE   Final   Gram Stain     Final   Value: RARE WBC PRESENT, PREDOMINANTLY MONONUCLEAR     RARE SQUAMOUS EPITHELIAL CELLS PRESENT     FEW GRAM NEGATIVE RODS     RARE GRAM POSITIVE COCCI IN PAIRS     Performed at [facility]   Culture     Final   Value: Culture reincubated for better growth     Performed at [facility]   Report Status PENDING   Incomplete       Assessment & Plan    Respiratory failure continue with ATC 40%, continue weaning trials Healthcare associated pneumonia status post treatment History of asthma on neb treatments when necessary Coronary artery disease status post ST elevation MI and ventricular fibrillation with arrest Hypertension Protein calorie malnutrition will consult IR for G-tube placement Anoxic brain injury with encephalopathy status post slowing with EEG History of schizophrenia History of seizures on Depakote and Keppra Increased secretions on scopolamine  Plan  Add Ativan when necessary Seroquel twice a day Consult IR for G-tube placement Change Protonix to per tube Code Status: Full   DVT Prophylaxis  heparin   Merton Border M.D on 07/24/2013 at 4:22 PM

## 2013-07-24 NOTE — Discharge Summary (Signed)
Quran Vasco, MD Pulmonary and Critical Care Medicine [facility] Pager: [phone]  

## 2013-07-25 LAB — CBC
HEMATOCRIT: 36.5 % — AB (ref 39.0–52.0)
Hemoglobin: 11.7 g/dL — ABNORMAL LOW (ref 13.0–17.0)
MCH: 33 pg (ref 26.0–34.0)
MCHC: 32.1 g/dL (ref 30.0–36.0)
MCV: 102.8 fL — AB (ref 78.0–100.0)
Platelets: 756 10*3/uL — ABNORMAL HIGH (ref 150–400)
RBC: 3.55 MIL/uL — ABNORMAL LOW (ref 4.22–5.81)
RDW: 15.3 % (ref 11.5–15.5)
WBC: 13 10*3/uL — AB (ref 4.0–10.5)

## 2013-07-25 NOTE — H&P (Signed)
Chase Blackwell is an 48 y.o. male.   Chief Complaint: cardiac arrest--found down in street Was followed by CCM for weeks in ICU Respiratory failure On vent now trach Transferred to [facility] for long term care Anoxic brain injury; encephalopathic Protein calorie malnutrition Request made for percutaneous gastric tube placement per [facility] Dr Vernard Gambles has reviewed films Pt has been seen and examined Now scheduled for G tube in IR Afeb; ancef on call; Hep inj held  HPI: CAD/MI; GERD; Asthma; Schizophrenic; Etoh abuse; HTN  Past Medical History  Diagnosis Date  . Hypertension   . Asthma   . GERD (gastroesophageal reflux disease)   . Chronic pain   . Schizophrenia   . Alcohol abuse     No past surgical history on file.  Family History  Problem Relation Age of Onset  . Malignant hyperthermia Mother   . Cirrhosis Father   . Alcohol abuse Father    Social History:  reports that he has been smoking Cigarettes.  He has been smoking about 1.00 pack per day. He does not have any smokeless tobacco history on file. He reports that he drinks about 1.2 ounces of alcohol per week. He reports that he uses illicit drugs (Marijuana).  Allergies:  Allergies  Allergen Reactions  . Hctz [Hydrochlorothiazide] Other (See Comments)    Dizzy spells  . Hydroxyzine Hives  . Sulfonamide Derivatives Hives  . Cetirizine & Related Other (See Comments)    unknown    Medications Prior to Admission  Medication Sig Dispense Refill  . acetaminophen (TYLENOL) 325 MG tablet Take 650 mg by mouth every 6 (six) hours as needed for mild pain.       Marland Kitchen albuterol (PROVENTIL) (2.5 MG/3ML) 0.083% nebulizer solution Inhale 3 mLs into the lungs every 6 (six) hours.  75 mL  12  . Amino Acids-Protein Hydrolys (FEEDING SUPPLEMENT, PRO-STAT SUGAR FREE 64,) LIQD Place 60 mLs into feeding tube 2 (two) times daily.  900 mL  0  . antiseptic oral rinse (BIOTENE) LIQD 15 mLs by Mouth Rinse route QID.      Marland Kitchen aspirin 81 MG chewable  tablet Chew 1 tablet (81 mg total) by mouth daily.      Marland Kitchen atorvastatin (LIPITOR) 80 MG tablet Take 1 tablet (80 mg total) by mouth daily at 6 PM.      . chlorhexidine (PERIDEX) 0.12 % solution 15 mLs by Mouth Rinse route 2 (two) times daily.  120 mL  0  . divalproex (DEPAKOTE SPRINKLE) 125 MG capsule Take 2 capsules (250 mg total) by mouth every 8 (eight) hours.      . folic acid (FOLVITE) 1 MG tablet Place 1 tablet (1 mg total) into feeding tube daily.      . furosemide (LASIX) 10 MG/ML solution Place 4 mLs (40 mg total) into feeding tube daily.    12  . heparin 5000 UNIT/ML injection Inject 1 mL (5,000 Units total) into the skin every 8 (eight) hours.  1 mL    . hydrALAZINE (APRESOLINE) 25 MG tablet Take 1 tablet (25 mg total) by mouth every 8 (eight) hours.      . insulin aspart (NOVOLOG) 100 UNIT/ML injection Inject 2-6 Units into the skin every 4 (four) hours. Correction coverage: Standard Scale CBG < 70: implement Hypoglycemia Protocol CBG 121 - 150: 2 units CBG 151 - 200: 4 units CBG 201 - 250: 6 units  10 mL  11  . labetalol (NORMODYNE,TRANDATE) 5 MG/ML injection Inject 2-4 mLs (10-20  mg total) into the vein every 6 (six) hours as needed (For syst BP >160).  20 mL    . levETIRAcetam 1,000 mg in sodium chloride 0.9 % 100 mL Inject 1,000 mg into the vein every 12 (twelve) hours.      . metoprolol tartrate (LOPRESSOR) 25 MG tablet Take 1 tablet (25 mg total) by mouth 2 (two) times daily.      . Nutritional Supplements (FEEDING SUPPLEMENT, VITAL 1.5 CAL,) LIQD Vital 1.5 CAL at 35 ml /hr per feeding tube.      . pantoprazole (PROTONIX) 40 MG injection Inject 40 mg into the vein at bedtime.  1 each    . thiamine 100 MG tablet Place 1 tablet (100 mg total) into feeding tube daily.      . ticagrelor (BRILINTA) 90 MG TABS tablet Take 1 tablet (90 mg total) by mouth 2 (two) times daily.  60 tablet      Results for orders placed during the hospital encounter of 07/23/13 (from the past 48 hour(s))   CBC     Status: Abnormal   Collection Time    07/23/13  6:55 PM      Result Value Ref Range   WBC 14.9 (*) 4.0 - 10.5 K/uL   RBC 3.75 (*) 4.22 - 5.81 MIL/uL   Hemoglobin 12.3 (*) 13.0 - 17.0 g/dL   HCT 38.0 (*) 39.0 - 52.0 %   MCV 101.3 (*) 78.0 - 100.0 fL   MCH 32.8  26.0 - 34.0 pg   MCHC 32.4  30.0 - 36.0 g/dL   RDW 15.5  11.5 - 15.5 %   Platelets 936 (*) 150 - 400 K/uL   Comment: CRITICAL VALUE NOTED.  VALUE IS CONSISTENT WITH PREVIOUSLY REPORTED AND CALLED VALUE.  BLOOD GAS, ARTERIAL     Status: Abnormal   Collection Time    07/24/13  4:20 AM      Result Value Ref Range   FIO2 0.40     Delivery systems TRACH COLLAR/TRACH TUBE     pH, Arterial 7.463 (*) 7.350 - 7.450   pCO2 arterial 36.1  35.0 - 45.0 mmHg   pO2, Arterial 81.3  80.0 - 100.0 mmHg   Bicarbonate 25.5 (*) 20.0 - 24.0 mEq/L   TCO2 26.6  0 - 100 mmol/L   Acid-Base Excess 2.0  0.0 - 2.0 mmol/L   O2 Saturation 95.9     Patient temperature 98.7     Collection site RIGHT RADIAL     Drawn by COLLECTED BY RT     Sample type ARTERIAL     Allens test (pass/fail) PASS  PASS  TSH     Status: None   Collection Time    07/24/13  5:00 AM      Result Value Ref Range   TSH 0.856  0.350 - 4.500 uIU/mL  URINALYSIS, ROUTINE W REFLEX MICROSCOPIC     Status: Abnormal   Collection Time    07/24/13  5:00 AM      Result Value Ref Range   Color, Urine AMBER (*) YELLOW   Comment: BIOCHEMICALS MAY BE AFFECTED BY COLOR   APPearance CLEAR  CLEAR   Specific Gravity, Urine 1.026  1.005 - 1.030   pH 6.0  5.0 - 8.0   Glucose, UA NEGATIVE  NEGATIVE mg/dL   Hgb urine dipstick NEGATIVE  NEGATIVE   Bilirubin Urine NEGATIVE  NEGATIVE   Ketones, ur NEGATIVE  NEGATIVE mg/dL   Protein, ur NEGATIVE  NEGATIVE mg/dL  Urobilinogen, UA 2.0 (*) 0.0 - 1.0 mg/dL   Nitrite POSITIVE (*) NEGATIVE   Leukocytes, UA LARGE (*) NEGATIVE  PREALBUMIN     Status: Abnormal   Collection Time    07/24/13  5:00 AM      Result Value Ref Range   Prealbumin  15.6 (*) 17.0 - 34.0 mg/dL   Comment: Performed at [facility]     Status: Abnormal   Collection Time    07/24/13  5:00 AM      Result Value Ref Range   Sodium 145  137 - 147 mEq/L   Potassium 3.4 (*) 3.7 - 5.3 mEq/L   Chloride 107  96 - 112 mEq/L   CO2 25  19 - 32 mEq/L   Glucose, Bld 135 (*) 70 - 99 mg/dL   BUN 28 (*) 6 - 23 mg/dL   Creatinine, Ser 0.55  0.50 - 1.35 mg/dL   Calcium 9.2  8.4 - 10.5 mg/dL   Total Protein 7.8  6.0 - 8.3 g/dL   Albumin 3.0 (*) 3.5 - 5.2 g/dL   AST 56 (*) 0 - 37 U/L   ALT 76 (*) 0 - 53 U/L   Alkaline Phosphatase 177 (*) 39 - 117 U/L   Total Bilirubin 0.7  0.3 - 1.2 mg/dL   GFR calc non Af Amer >90  >90 mL/min   GFR calc Af Amer >90  >90 mL/min   Comment: (NOTE)     The eGFR has been calculated using the CKD EPI equation.     This calculation has not been validated in all clinical situations.     eGFR's persistently <90 mL/min signify possible Chronic Kidney     Disease.   Anion gap 13  5 - 15  CBC WITH DIFFERENTIAL     Status: Abnormal   Collection Time    07/24/13  5:00 AM      Result Value Ref Range   WBC 14.0 (*) 4.0 - 10.5 K/uL   RBC 3.50 (*) 4.22 - 5.81 MIL/uL   Hemoglobin 11.5 (*) 13.0 - 17.0 g/dL   HCT 35.7 (*) 39.0 - 52.0 %   MCV 102.0 (*) 78.0 - 100.0 fL   MCH 32.9  26.0 - 34.0 pg   MCHC 32.2  30.0 - 36.0 g/dL   RDW 15.4  11.5 - 15.5 %   Platelets 931 (*) 150 - 400 K/uL   Comment: CRITICAL VALUE NOTED.  VALUE IS CONSISTENT WITH PREVIOUSLY REPORTED AND CALLED VALUE.   Neutrophils Relative % 73  43 - 77 %   Neutro Abs 10.2 (*) 1.7 - 7.7 K/uL   Lymphocytes Relative 11 (*) 12 - 46 %   Lymphs Abs 1.5  0.7 - 4.0 K/uL   Monocytes Relative 13 (*) 3 - 12 %   Monocytes Absolute 1.9 (*) 0.1 - 1.0 K/uL   Eosinophils Relative 2  0 - 5 %   Eosinophils Absolute 0.3  0.0 - 0.7 K/uL   Basophils Relative 1  0 - 1 %   Basophils Absolute 0.1  0.0 - 0.1 K/uL  URINE MICROSCOPIC-ADD ON     Status: Abnormal    Collection Time    07/24/13  5:00 AM      Result Value Ref Range   WBC, UA 21-50  <3 WBC/hpf   RBC / HPF 3-6  <3 RBC/hpf   Bacteria, UA MANY (*) RARE   Urine-Other MUCOUS PRESENT    CBC  Status: Abnormal   Collection Time    07/25/13  8:40 AM      Result Value Ref Range   WBC 13.0 (*) 4.0 - 10.5 K/uL   RBC 3.55 (*) 4.22 - 5.81 MIL/uL   Hemoglobin 11.7 (*) 13.0 - 17.0 g/dL   HCT 36.5 (*) 39.0 - 52.0 %   MCV 102.8 (*) 78.0 - 100.0 fL   MCH 33.0  26.0 - 34.0 pg   MCHC 32.1  30.0 - 36.0 g/dL   RDW 15.3  11.5 - 15.5 %   Platelets 756 (*) 150 - 400 K/uL   Dg Chest Port 1 View  07/23/2013   CLINICAL DATA:  Respiratory failure.  EXAM: PORTABLE CHEST - 1 VIEW  COMPARISON:  Chest x-ray 07/21/2013.  FINDINGS: Tracheostomy tube remains in position with tip terminating approximately 6.2 cm above the carina. There is a left-sided internal jugular central venous catheter with tip terminating in the proximal superior vena cava. A feeding tube is seen extending into the abdomen, however, the tip of the feeding tube extends below the lower margin of the image. Lung volumes are low. The continues to be an opacity in the medial aspect of the lower right hemithorax, favored to reflect partial collapse and consolidation of the right lower lobe. Aeration appears slightly worsened when compared to the recent prior study. Patchy perihilar opacities are also noted in the left lung, particularly in the left lung base. No pleural effusions. No evidence of pulmonary edema. Heart size is normal. Upper mediastinal contours are within normal limits.  IMPRESSION: 1. Worsening atelectasis and/or consolidation in the right lower lobe. Otherwise, the radiographic appearance the chest is very similar to the prior study, as above.   Electronically Signed   By: Vinnie Langton M.D.   On: 07/23/2013 17:47   Dg Abd Portable 1v  07/24/2013   CLINICAL DATA:  NG tube placement.  EXAM: PORTABLE ABDOMEN - 1 VIEW  COMPARISON:   Abdomen series 720 2015.  FINDINGS: Interim removal of Dobbhoff tube and placement of NG tube. NG tube noted projected over stomach. Dilated loops of small and large bowel noted. These findings are most consistent with adynamic ileus. No free air identified.  IMPRESSION: NG tube noted projected over the stomach. Slightly dilated loops of small and large bowel noted consistent with adynamic ileus.   Electronically Signed   By: Marcello Moores  Register   On: 07/24/2013 15:15   Dg Abd Portable 1v  07/23/2013   CLINICAL DATA:  NG tube placement  EXAM: PORTABLE ABDOMEN - 1 VIEW  COMPARISON:  07/20/2013 radiographs.  FINDINGS: 2123 hr. Examination is motion degraded. No nasogastric tube is visualized. A feeding tube projects into the distal stomach or proximal duodenum. The visualized bowel gas pattern is nonobstructive.  IMPRESSION: Feeding tube has been advanced into the distal stomach or proximal duodenum. No visible nasogastric tube.   Electronically Signed   By: Camie Patience M.D.   On: 07/23/2013 21:39    Review of Systems  Constitutional: Positive for weight loss. Negative for fever.  Respiratory: Positive for cough and sputum production.   Neurological: Positive for weakness.    There were no vitals taken for this visit. Physical Exam  Constitutional: He appears well-developed.  Cardiovascular: Normal rate.   No murmur heard. Respiratory: Effort normal. He has no wheezes.  GI: Soft. There is no tenderness.  Musculoskeletal:  Is moving all 4s Fidgety in bed No response  Neurological:  Does not follow commands  Psychiatric:  Attempted consent with mother--NA; no message RN continuing to try     Assessment/Plan Anoxic brain injury- encephalopathic Cardiac arrest PCM Need for long term care Scheduled for perc G tube in IR Consent will need to be obtained from mother--RN working on this  [place] A 07/25/2013, 9:29 AM

## 2013-07-25 NOTE — Progress Notes (Signed)
[facility]                                                                                              Progress note     Patient Demographics  Chase Blackwell, is a 48 y.o. male  JGG:[id number]  LYY:[id number]  DOB - 11-Mar-1965  Admit date - 07/23/2013  Admitting Physician Merton Border, MD  Outpatient Primary MD for the patient is Default, Provider, MD  LOS - 2   Chief complaint   Respiratory failure   Encephalopathy with anoxic brain injury   Protein calorie malnutrition         Subjective:   Chase Blackwell cannot give any history  Objective:   Vital signs  Temperature 99.8 Heart rate 111 Respiratory rate 30 Blood pressure 128/64 Pulse ox 99%    Exam Encephalopathic [place].AT, pupils 2 mm bilaterally with sluggish reaction to light NG tube in place Supple Neck,No JVD, No cervical lymphadenopathy appriciated. Tracheostomy in place Symmetrical Chest wall movement, decreased breath sounds bilaterally with scattered rhonchi RRR,No Gallops,Rubs or new Murmurs, No Parasternal Heave +ve B.Sounds, Abd Soft, Non tender, No organomegaly appriciated, No rebound - guarding or rigidity. No Cyanosis, Clubbing or edema, No new Rash or bruise    I&Os positive 785   Data Review   CBC  Recent Labs Lab 07/19/13 0500  07/22/13 0225 07/23/13 0055 07/23/13 1855 07/24/13 0500 07/25/13 0840  WBC 12.7*  < > 16.6* 15.3* 14.9* 14.0* 13.0*  HGB 10.2*  < > 10.9* 11.9* 12.3* 11.5* 11.7*  HCT 32.0*  < > 34.1* 36.0* 38.0* 35.7* 36.5*  PLT 563*  < > 795* 922* 936* 931* 756*  MCV 99.7  < > 100.9* 99.7 101.3* 102.0* 102.8*  MCH 31.8  < > 32.2 33.0 32.8 32.9 33.0  MCHC 31.9  < > 32.0 33.1 32.4 32.2 32.1  RDW 15.5  < > 15.6* 15.4 15.5 15.4 15.3  LYMPHSABS 1.8  --   --   --   --  1.5  --   MONOABS 0.6  --   --   --   --  1.9*  --   EOSABS 0.6  --   --   --   --  0.3  --   BASOSABS 0.0  --   --   --    --  0.1  --   < > = values in this interval not displayed.  Chemistries   Recent Labs Lab 07/19/13 0500 07/20/13 0447 07/21/13 0428 07/22/13 0225 07/23/13 0055 07/24/13 0500  NA 146 145 144 143 146 145  K 3.7 4.6 3.9 3.6* 3.8 3.4*  CL 104 105 105 102 104 107  CO2 27 25 24 23 24 25   GLUCOSE 136* 99 125* 129* 120* 135*  BUN 22 31* 29* 21 26* 28*  CREATININE 0.73 0.83 0.70 0.49* 0.55 0.55  CALCIUM 8.4 8.4 8.5 8.6 9.1 9.2  MG 2.6* 2.7* 2.7*  --  2.5  --   AST  --   --   --   --   --  56*  ALT  --   --   --   --   --  27*  ALKPHOS  --   --   --   --   --  177*  BILITOT  --   --   --   --   --  0.7   ------------------------------------------------------------------------------------------------------------------ CrCl is unknown because both a height and weight (above a minimum accepted value) are required for this calculation. ------------------------------------------------------------------------------------------------------------------ No results found for this basename: HGBA1C,  in the last 72 hours ------------------------------------------------------------------------------------------------------------------ No results found for this basename: CHOL, HDL, LDLCALC, TRIG, CHOLHDL, LDLDIRECT,  in the last 72 hours ------------------------------------------------------------------------------------------------------------------  Recent Labs  07/24/13 0500  TSH 0.856   ------------------------------------------------------------------------------------------------------------------ No results found for this basename: VITAMINB12, FOLATE, FERRITIN, TIBC, IRON, RETICCTPCT,  in the last 72 hours  Coagulation profile  Recent Labs Lab 07/20/13 0447  INR 1.09    No results found for this basename: DDIMER,  in the last 72 hours  Cardiac Enzymes No results found for this basename: CK, CKMB, TROPONINI, MYOGLOBIN,  in the last 168  hours ------------------------------------------------------------------------------------------------------------------ No components found with this basename: POCBNP,   Micro Results Recent Results (from the past 240 hour(s))  CULTURE, BLOOD (ROUTINE X 2)     Status: None   Collection Time    07/23/13 12:55 AM      Result Value Ref Range Status   Specimen Description BLOOD RIGHT ARM   Final   Special Requests BOTTLES DRAWN AEROBIC ONLY [place]   Final   Culture  Setup Time     Final   Value: 07/23/2013 09:02     Performed at [facility]   Culture     Final   Value:        BLOOD CULTURE RECEIVED NO GROWTH TO DATE CULTURE WILL BE HELD FOR 5 DAYS BEFORE ISSUING A FINAL NEGATIVE REPORT     Performed at [facility]   Report Status PENDING   Incomplete  CULTURE, BLOOD (ROUTINE X 2)     Status: None   Collection Time    07/23/13  1:00 AM      Result Value Ref Range Status   Specimen Description BLOOD RIGHT FOREARM   Final   Special Requests BOTTLES DRAWN AEROBIC ONLY 8CC   Final   Culture  Setup Time     Final   Value: 07/23/2013 09:03     Performed at [facility]   Culture     Final   Value:        BLOOD CULTURE RECEIVED NO GROWTH TO DATE CULTURE WILL BE HELD FOR 5 DAYS BEFORE ISSUING A FINAL NEGATIVE REPORT     Performed at [facility]   Report Status PENDING   Incomplete  URINE CULTURE     Status: None   Collection Time    07/23/13  1:27 AM      Result Value Ref Range Status   Specimen Description URINE, CATHETERIZED   Final   Special Requests Normal   Final   Culture  Setup Time     Final   Value: 07/23/2013 01:49     Performed at [facility]     Final   Value: NO GROWTH     Performed at [facility]   Culture     Final   Value: NO GROWTH     Performed at [facility]   Report Status 07/24/2013 FINAL   Final  CULTURE, RESPIRATORY (NON-EXPECTORATED)     Status: None   Collection Time  07/23/13   3:09 AM      Result Value Ref Range Status   Specimen Description TRACHEAL ASPIRATE   Final   Special Requests NONE   Final   Gram Stain     Final   Value: RARE WBC PRESENT, PREDOMINANTLY MONONUCLEAR     RARE SQUAMOUS EPITHELIAL CELLS PRESENT     FEW GRAM NEGATIVE RODS     RARE GRAM POSITIVE COCCI IN PAIRS     Performed at [facility]   Culture     Final   Value: MODERATE PSEUDOMONAS AERUGINOSA     MODERATE GRAM NEGATIVE RODS     Performed at [facility]   Report Status PENDING   Incomplete       Assessment & Plan    Respiratory failure continue with ATC 40%, continue weaning trials Healthcare associated pneumonia status post treatment History of asthma on neb treatments when necessary Coronary artery disease status post ST elevation MI and ventricular fibrillation with arrest Hypertension Protein calorie malnutrition will consult IR for G-tube placement continue with tube feeding per NG  Anoxic brain injury with encephalopathy status post slowing with EEGstart fish oil and Provigil  History of schizophrenia with agitation on  Seroquel  History of seizures on Depakote and Keppra Increased secretions  with a Pseudomonas positive culture on scopolamine, add cefepime   Plan   fish oil/Provigil per tube  Start cefepime IV  Check urine culture  Critical care time; 37 minutes  Code Status: Full   DVT Prophylaxis  heparin   Merton Border M.D on 07/25/2013 at 11:18 AM

## 2013-07-25 NOTE — Progress Notes (Signed)
[facility]                                                                                              Progress note     Patient Demographics  Chase Blackwell, is a 48 y.o. male  [id number]  [id number]  DOB - 1965-06-12  Admit date - 07/23/2013  Admitting Physician Merton Border, MD  Outpatient Primary MD for the patient is Default, Provider, MD  LOS - 2   Chief complaint   Respiratory failure   Encephalopathy with anoxic brain injury   Protein calorie malnutrition         Subjective:   Durk Farro cannot give any history  Objective:   Vital signs  Temperature 99.8 Heart rate 111 Respiratory rate 20 Blood pressure 128/66 Pulse ox 99%    Exam Encephalopathic [place].AT, pupils 2 mm bilaterally with sluggish reaction to light NG tube in place Supple Neck,No JVD, No cervical lymphadenopathy appriciated. Tracheostomy in place Symmetrical Chest wall movement, decreased breath sounds bilaterally with scattered rhonchi RRR,No Gallops,Rubs or new Murmurs, No Parasternal Heave +ve B.Sounds, Abd Soft, Non tender, No organomegaly appriciated, No rebound - guarding or rigidity. No Cyanosis, Clubbing or edema, No new Rash or bruise    I&Os positive 115   Data Review   CBC  Recent Labs Lab 07/19/13 0500  07/22/13 0225 07/23/13 0055 07/23/13 1855 07/24/13 0500 07/25/13 0840  WBC 12.7*  < > 16.6* 15.3* 14.9* 14.0* 13.0*  HGB 10.2*  < > 10.9* 11.9* 12.3* 11.5* 11.7*  HCT 32.0*  < > 34.1* 36.0* 38.0* 35.7* 36.5*  PLT 563*  < > 795* 922* 936* 931* 756*  MCV 99.7  < > 100.9* 99.7 101.3* 102.0* 102.8*  MCH 31.8  < > 32.2 33.0 32.8 32.9 33.0  MCHC 31.9  < > 32.0 33.1 32.4 32.2 32.1  RDW 15.5  < > 15.6* 15.4 15.5 15.4 15.3  LYMPHSABS 1.8  --   --   --   --  1.5  --   MONOABS 0.6  --   --   --   --  1.9*  --   EOSABS 0.6  --   --   --   --  0.3  --   BASOSABS 0.0  --   --   --    --  0.1  --   < > = values in this interval not displayed.  Chemistries   Recent Labs Lab 07/19/13 0500 07/20/13 0447 07/21/13 0428 07/22/13 0225 07/23/13 0055 07/24/13 0500  NA 146 145 144 143 146 145  K 3.7 4.6 3.9 3.6* 3.8 3.4*  CL 104 105 105 102 104 107  CO2 27 25 24 23 24 25   GLUCOSE 136* 99 125* 129* 120* 135*  BUN 22 31* 29* 21 26* 28*  CREATININE 0.73 0.83 0.70 0.49* 0.55 0.55  CALCIUM 8.4 8.4 8.5 8.6 9.1 9.2  MG 2.6* 2.7* 2.7*  --  2.5  --   AST  --   --   --   --   --  56*  ALT  --   --   --   --   --  7*  ALKPHOS  --   --   --   --   --  177*  BILITOT  --   --   --   --   --  0.7   ------------------------------------------------------------------------------------------------------------------ CrCl is unknown because both a height and weight (above a minimum accepted value) are required for this calculation. ------------------------------------------------------------------------------------------------------------------ No results found for this basename: HGBA1C,  in the last 72 hours ------------------------------------------------------------------------------------------------------------------ No results found for this basename: CHOL, HDL, LDLCALC, TRIG, CHOLHDL, LDLDIRECT,  in the last 72 hours ------------------------------------------------------------------------------------------------------------------  Recent Labs  07/24/13 0500  TSH 0.856   ------------------------------------------------------------------------------------------------------------------ No results found for this basename: VITAMINB12, FOLATE, FERRITIN, TIBC, IRON, RETICCTPCT,  in the last 72 hours  Coagulation profile  Recent Labs Lab 07/20/13 0447  INR 1.09    No results found for this basename: DDIMER,  in the last 72 hours  Cardiac Enzymes No results found for this basename: CK, CKMB, TROPONINI, MYOGLOBIN,  in the last 168  hours ------------------------------------------------------------------------------------------------------------------ No components found with this basename: POCBNP,   Micro Results Recent Results (from the past 240 hour(s))  CULTURE, BLOOD (ROUTINE X 2)     Status: None   Collection Time    07/23/13 12:55 AM      Result Value Ref Range Status   Specimen Description BLOOD RIGHT ARM   Final   Special Requests BOTTLES DRAWN AEROBIC ONLY [place]   Final   Culture  Setup Time     Final   Value: 07/23/2013 09:02     Performed at [facility]   Culture     Final   Value:        BLOOD CULTURE RECEIVED NO GROWTH TO DATE CULTURE WILL BE HELD FOR 5 DAYS BEFORE ISSUING A FINAL NEGATIVE REPORT     Performed at [facility]   Report Status PENDING   Incomplete  CULTURE, BLOOD (ROUTINE X 2)     Status: None   Collection Time    07/23/13  1:00 AM      Result Value Ref Range Status   Specimen Description BLOOD RIGHT FOREARM   Final   Special Requests BOTTLES DRAWN AEROBIC ONLY 8CC   Final   Culture  Setup Time     Final   Value: 07/23/2013 09:03     Performed at [facility]   Culture     Final   Value:        BLOOD CULTURE RECEIVED NO GROWTH TO DATE CULTURE WILL BE HELD FOR 5 DAYS BEFORE ISSUING A FINAL NEGATIVE REPORT     Performed at [facility]   Report Status PENDING   Incomplete  URINE CULTURE     Status: None   Collection Time    07/23/13  1:27 AM      Result Value Ref Range Status   Specimen Description URINE, CATHETERIZED   Final   Special Requests Normal   Final   Culture  Setup Time     Final   Value: 07/23/2013 01:49     Performed at [facility]     Final   Value: NO GROWTH     Performed at [facility]   Culture     Final   Value: NO GROWTH     Performed at [facility]   Report Status 07/24/2013 FINAL   Final  CULTURE, RESPIRATORY (NON-EXPECTORATED)     Status: None   Collection Time  07/23/13   3:09 AM      Result Value Ref Range Status   Specimen Description TRACHEAL ASPIRATE   Final   Special Requests NONE   Final   Gram Stain     Final   Value: RARE WBC PRESENT, PREDOMINANTLY MONONUCLEAR     RARE SQUAMOUS EPITHELIAL CELLS PRESENT     FEW GRAM NEGATIVE RODS     RARE GRAM POSITIVE COCCI IN PAIRS     Performed at [facility]   Culture     Final   Value: MODERATE PSEUDOMONAS AERUGINOSA     MODERATE GRAM NEGATIVE RODS     Performed at [facility]   Report Status PENDING   Incomplete       Assessment & Plan    Respiratory failure continue with ATC 40%, continue weaning trials Healthcare associated pneumonia status post treatment History of asthma on neb treatments when necessary Coronary artery disease status post ST elevation MI and ventricular fibrillation with arrest Hypertension Protein calorie malnutrition will consult IR for G-tube placement Anoxic brain injury with encephalopathy status post slowing with EEG History of schizophrenia History of seizures on Depakote and Keppra Increased secretions on scopolamine  Plan  Add Ativan when necessary Seroquel twice a day Consult IR for G-tube placement Change Protonix to per tube Code Status: Full   DVT Prophylaxis  heparin   Merton Border M.D on 07/25/2013 at 11:54 AM

## 2013-07-26 LAB — CBC
HCT: 35.1 % — ABNORMAL LOW (ref 39.0–52.0)
HEMOGLOBIN: 11.1 g/dL — AB (ref 13.0–17.0)
MCH: 31.9 pg (ref 26.0–34.0)
MCHC: 31.6 g/dL (ref 30.0–36.0)
MCV: 100.9 fL — ABNORMAL HIGH (ref 78.0–100.0)
Platelets: 669 10*3/uL — ABNORMAL HIGH (ref 150–400)
RBC: 3.48 MIL/uL — ABNORMAL LOW (ref 4.22–5.81)
RDW: 15.3 % (ref 11.5–15.5)
WBC: 11.2 10*3/uL — ABNORMAL HIGH (ref 4.0–10.5)

## 2013-07-26 LAB — CULTURE, RESPIRATORY W GRAM STAIN

## 2013-07-26 LAB — CULTURE, RESPIRATORY

## 2013-07-26 NOTE — Progress Notes (Signed)
[facility]                                                                                              Progress note     Patient Demographics  Chase Blackwell, is a 48 y.o. male  [id number]  [id number]  DOB - [birth year]  Admit date - 07/23/2013  Admitting Physician Merton Border, MD  Outpatient Primary MD for the patient is Default, Provider, MD  LOS - 3   Chief complaint   Respiratory failure   Encephalopathy with anoxic brain injury   Protein calorie malnutrition         Subjective:   Chase Blackwell cannot give any history  Objective:   Vital signs  Temperature 98.9 Heart rate 18 Respiratory rate 20 Blood pressure 123/87 Pulse ox 99%    Exam Encephalopathic [place].AT, pupils 2 mm bilaterally with sluggish reaction to light NG tube in place Supple Neck,No JVD, No cervical lymphadenopathy appriciated. Tracheostomy in place Symmetrical Chest wall movement, decreased breath sounds bilaterally with scattered rhonchi RRR,No Gallops,Rubs or new Murmurs, No Parasternal Heave +ve B.Sounds, Abd Soft, Non tender, No organomegaly appriciated, No rebound - guarding or rigidity. No Cyanosis, Clubbing or edema, No new Rash or bruise    I&Os unknown   Data Review   CBC  Recent Labs Lab 07/23/13 0055 07/23/13 1855 07/24/13 0500 07/25/13 0840 07/26/13 0600  WBC 15.3* 14.9* 14.0* 13.0* 11.2*  HGB 11.9* 12.3* 11.5* 11.7* 11.1*  HCT 36.0* 38.0* 35.7* 36.5* 35.1*  PLT 922* 936* 931* 756* 669*  MCV 99.7 101.3* 102.0* 102.8* 100.9*  MCH 33.0 32.8 32.9 33.0 31.9  MCHC 33.1 32.4 32.2 32.1 31.6  RDW 15.4 15.5 15.4 15.3 15.3  LYMPHSABS  --   --  1.5  --   --   MONOABS  --   --  1.9*  --   --   EOSABS  --   --  0.3  --   --   BASOSABS  --   --  0.1  --   --     Chemistries   Recent Labs Lab 07/20/13 0447 07/21/13 0428 07/22/13 0225 07/23/13 0055 07/24/13 0500  NA 145 144 143  146 145  K 4.6 3.9 3.6* 3.8 3.4*  CL 105 105 102 104 107  CO2 25 24 23 24 25   GLUCOSE 99 125* 129* 120* 135*  BUN 31* 29* 21 26* 28*  CREATININE 0.83 0.70 0.49* 0.55 0.55  CALCIUM 8.4 8.5 8.6 9.1 9.2  MG 2.7* 2.7*  --  2.5  --   AST  --   --   --   --  56*  ALT  --   --   --   --  76*  ALKPHOS  --   --   --   --  177*  BILITOT  --   --   --   --  0.7   ------------------------------------------------------------------------------------------------------------------ CrCl is unknown because both a height and weight (above a minimum accepted value) are required for this calculation. ------------------------------------------------------------------------------------------------------------------ No results found for this basename: HGBA1C,  in the last  72 hours ------------------------------------------------------------------------------------------------------------------ No results found for this basename: CHOL, HDL, LDLCALC, TRIG, CHOLHDL, LDLDIRECT,  in the last 72 hours ------------------------------------------------------------------------------------------------------------------  Recent Labs  07/24/13 0500  TSH 0.856   ------------------------------------------------------------------------------------------------------------------ No results found for this basename: VITAMINB12, FOLATE, FERRITIN, TIBC, IRON, RETICCTPCT,  in the last 72 hours  Coagulation profile  Recent Labs Lab 07/20/13 0447  INR 1.09    No results found for this basename: DDIMER,  in the last 72 hours  Cardiac Enzymes No results found for this basename: CK, CKMB, TROPONINI, MYOGLOBIN,  in the last 168 hours ------------------------------------------------------------------------------------------------------------------ No components found with this basename: POCBNP,   Micro Results Recent Results (from the past 240 hour(s))  CULTURE, BLOOD (ROUTINE X 2)     Status: None   Collection Time     07/23/13 12:55 AM      Result Value Ref Range Status   Specimen Description BLOOD RIGHT ARM   Final   Special Requests BOTTLES DRAWN AEROBIC ONLY [place]   Final   Culture  Setup Time     Final   Value: 07/23/2013 09:02     Performed at [facility]   Culture     Final   Value:        BLOOD CULTURE RECEIVED NO GROWTH TO DATE CULTURE WILL BE HELD FOR 5 DAYS BEFORE ISSUING A FINAL NEGATIVE REPORT     Performed at [facility]   Report Status PENDING   Incomplete  CULTURE, BLOOD (ROUTINE X 2)     Status: None   Collection Time    07/23/13  1:00 AM      Result Value Ref Range Status   Specimen Description BLOOD RIGHT FOREARM   Final   Special Requests BOTTLES DRAWN AEROBIC ONLY 8CC   Final   Culture  Setup Time     Final   Value: 07/23/2013 09:03     Performed at [facility]   Culture     Final   Value:        BLOOD CULTURE RECEIVED NO GROWTH TO DATE CULTURE WILL BE HELD FOR 5 DAYS BEFORE ISSUING A FINAL NEGATIVE REPORT     Performed at [facility]   Report Status PENDING   Incomplete  URINE CULTURE     Status: None   Collection Time    07/23/13  1:27 AM      Result Value Ref Range Status   Specimen Description URINE, CATHETERIZED   Final   Special Requests Normal   Final   Culture  Setup Time     Final   Value: 07/23/2013 01:49     Performed at [place]     Final   Value: NO GROWTH     Performed at [facility]   Culture     Final   Value: NO GROWTH     Performed at [facility]   Report Status 07/24/2013 FINAL   Final  CULTURE, RESPIRATORY (NON-EXPECTORATED)     Status: None   Collection Time    07/23/13  3:09 AM      Result Value Ref Range Status   Specimen Description TRACHEAL ASPIRATE   Final   Special Requests NONE   Final   Gram Stain     Final   Value: RARE WBC PRESENT, PREDOMINANTLY MONONUCLEAR     RARE SQUAMOUS EPITHELIAL CELLS PRESENT     FEW GRAM NEGATIVE RODS  RARE GRAM POSITIVE  COCCI IN PAIRS     Performed at [facility]   Culture     Final   Value: MODERATE PSEUDOMONAS AERUGINOSA     MODERATE KLEBSIELLA PNEUMONIAE     Performed at [facility]   Report Status 07/26/2013 FINAL   Final   Organism ID, Bacteria PSEUDOMONAS AERUGINOSA   Final   Organism ID, Bacteria KLEBSIELLA PNEUMONIAE   Final       Assessment & Plan    Respiratory failure continue with ATC 40%, continue weaning trials Healthcare associated pneumonia status post treatment History of asthma on neb treatments when necessary Coronary artery disease status post ST elevation MI and ventricular fibrillation with arrest Hypertension Protein calorie malnutrition will consult IR for G-tube placement Anoxic brain injury with encephalopathy status post EEG showing slowing; on fish oil and provigil History of schizophrenia/agitation on Seroquel and Haldol and Ativan when necessary History of seizures on Depakote and Keppra Increased secretions on scopolamine Pseudomonas and Klebsiella tracheobronchitis  Plan  Change IV cefepime to Cipro per tube  Haldol 4 mg per tube twice a day Increase Seroquel to 150 mg by mouth every afternoon and 50 mg every morning  Code Status: Full   DVT Prophylaxis  heparin   Merton Border M.D on 07/26/2013 at 2:38 PM

## 2013-07-27 ENCOUNTER — Other Ambulatory Visit (HOSPITAL_COMMUNITY): Payer: Medicare Other

## 2013-07-27 ENCOUNTER — Other Ambulatory Visit (HOSPITAL_COMMUNITY): Payer: Self-pay

## 2013-07-27 ENCOUNTER — Encounter: Payer: Self-pay | Admitting: Radiology

## 2013-07-27 LAB — PROTIME-INR
INR: 1.14 (ref 0.00–1.49)
Prothrombin Time: 14.6 seconds (ref 11.6–15.2)

## 2013-07-27 LAB — BASIC METABOLIC PANEL
ANION GAP: 15 (ref 5–15)
BUN: 22 mg/dL (ref 6–23)
CALCIUM: 8.8 mg/dL (ref 8.4–10.5)
CO2: 26 mEq/L (ref 19–32)
CREATININE: 0.64 mg/dL (ref 0.50–1.35)
Chloride: 104 mEq/L (ref 96–112)
GFR calc non Af Amer: >90 mL/min (ref >90)
Glucose, Bld: 86 mg/dL (ref 70–99)
Potassium: 3.3 mEq/L — ABNORMAL LOW (ref 3.7–5.3)
Sodium: 145 mEq/L (ref 137–147)

## 2013-07-27 LAB — CBC
HCT: 35 % — ABNORMAL LOW (ref 39.0–52.0)
Hemoglobin: 11 g/dL — ABNORMAL LOW (ref 13.0–17.0)
MCH: 31.9 pg (ref 26.0–34.0)
MCHC: 31.4 g/dL (ref 30.0–36.0)
MCV: 101.4 fL — AB (ref 78.0–100.0)
PLATELETS: 600 10*3/uL — AB (ref 150–400)
RBC: 3.45 MIL/uL — ABNORMAL LOW (ref 4.22–5.81)
RDW: 15.2 % (ref 11.5–15.5)
WBC: 9.5 10*3/uL (ref 4.0–10.5)

## 2013-07-27 MED ORDER — FENTANYL CITRATE 0.05 MG/ML IJ SOLN
INTRAMUSCULAR | Status: AC | PRN
  Administered 2013-07-27: 50 ug via INTRAVENOUS

## 2013-07-27 MED ORDER — FENTANYL CITRATE 0.05 MG/ML IJ SOLN
INTRAMUSCULAR | Status: AC
  Filled 2013-07-27: qty 2

## 2013-07-27 MED ORDER — MIDAZOLAM HCL 2 MG/2ML IJ SOLN
INTRAMUSCULAR | Status: AC | PRN
  Administered 2013-07-27: 1 mg via INTRAVENOUS

## 2013-07-27 MED ORDER — IOHEXOL 300 MG/ML  SOLN
50.0000 mL | Freq: Once | INTRAMUSCULAR | Status: AC | PRN
  Administered 2013-07-27: 15 mL

## 2013-07-27 MED ORDER — CEFAZOLIN SODIUM-DEXTROSE 2-3 GM-% IV SOLR
2.0000 g | Freq: Once | INTRAVENOUS | Status: AC
  Administered 2013-07-27: 2 g via INTRAVENOUS

## 2013-07-27 MED ORDER — MIDAZOLAM HCL 2 MG/2ML IJ SOLN
INTRAMUSCULAR | Status: AC
  Filled 2013-07-27: qty 4

## 2013-07-27 MED ORDER — GLUCAGON HCL RDNA (DIAGNOSTIC) 1 MG IJ SOLR
INTRAMUSCULAR | Status: AC
  Administered 2013-07-27: 1 mg
  Filled 2013-07-27: qty 1

## 2013-07-27 MED ORDER — IOHEXOL 300 MG/ML  SOLN
50.0000 mL | Freq: Once | INTRAMUSCULAR | Status: AC | PRN
  Administered 2013-07-27: 50 mL via ORAL

## 2013-07-27 NOTE — Procedures (Signed)
Interventional Radiology Procedure Note  Procedure: Placement of percutaneous 73F pull-through gastrostomy tube. Complications: None Recommendations: - NPO except for sips and chips remainder of today and overnight - Maintain G-tube to LWS until tomorrow morning  - May advance diet as tolerated and begin using tube tomorrow morning  Signed,  Criselda Peaches, MD [facility]

## 2013-07-27 NOTE — Progress Notes (Addendum)
[facility]                                                                                              Progress note     Patient Demographics  Chase Blackwell, is a 48 y.o. male  [id number]  [id number]  DOB - 11/26/1965  Admit date - 07/23/2013  Admitting Physician Merton Border, MD  Outpatient Primary MD for the patient is Default, Provider, MD  LOS - 4   Chief complaint   Respiratory failure   Encephalopathy with anoxic brain injury   Protein calorie malnutrition         Subjective:   Chase Blackwell is status post G-tube placement  Objective:   Vital signs  Temperature 98 Heart rate 76 Respiratory rate 18 Blood pressure 106/69 Pulse ox 100%    Exam Encephalopathic [place].AT, pupils 2 mm bilaterally with sluggish reaction to light NG tube in place Supple Neck,No JVD, No cervical lymphadenopathy appriciated. Tracheostomy in place Symmetrical Chest wall movement, decreased breath sounds bilaterally with scattered rhonchi RRR,No Gallops,Rubs or new Murmurs, No Parasternal Heave +ve B.Sounds, Abd Soft, Non tender, No organomegaly appriciated, No rebound - guarding or rigidity. PEG tube in place No Cyanosis, Clubbing or edema, No new Rash or bruise    I&Os -603   Data Review   CBC  Recent Labs Lab 07/23/13 1855 07/24/13 0500 07/25/13 0840 07/26/13 0600 07/30/2013 0500  WBC 14.9* 14.0* 13.0* 11.2* 9.5  HGB 12.3* 11.5* 11.7* 11.1* 11.0*  HCT 38.0* 35.7* 36.5* 35.1* 35.0*  PLT 936* 931* 756* 669* 600*  MCV 101.3* 102.0* 102.8* 100.9* 101.4*  MCH 32.8 32.9 33.0 31.9 31.9  MCHC 32.4 32.2 32.1 31.6 31.4  RDW 15.5 15.4 15.3 15.3 15.2  LYMPHSABS  --  1.5  --   --   --   MONOABS  --  1.9*  --   --   --   EOSABS  --  0.3  --   --   --   BASOSABS  --  0.1  --   --   --     Chemistries   Recent Labs Lab 07/21/13 0428 07/22/13 0225 07/23/13 0055 07/24/13 0500  07/14/2013 0500  NA 144 143 146 145 145  K 3.9 3.6* 3.8 3.4* 3.3*  CL 105 102 104 107 104  CO2 24 23 24 25 26   GLUCOSE 125* 129* 120* 135* 86  BUN 29* 21 26* 28* 22  CREATININE 0.70 0.49* 0.55 0.55 0.64  CALCIUM 8.5 8.6 9.1 9.2 8.8  MG 2.7*  --  2.5  --   --   AST  --   --   --  56*  --   ALT  --   --   --  76*  --   ALKPHOS  --   --   --  177*  --   BILITOT  --   --   --  0.7  --    ------------------------------------------------------------------------------------------------------------------ CrCl is unknown because both a height and weight (above a minimum accepted value) are required for this calculation. ------------------------------------------------------------------------------------------------------------------ No results found for  this basename: HGBA1C,  in the last 72 hours ------------------------------------------------------------------------------------------------------------------ No results found for this basename: CHOL, HDL, LDLCALC, TRIG, CHOLHDL, LDLDIRECT,  in the last 72 hours ------------------------------------------------------------------------------------------------------------------ No results found for this basename: TSH, T4TOTAL, FREET3, T3FREE, THYROIDAB,  in the last 72 hours ------------------------------------------------------------------------------------------------------------------ No results found for this basename: VITAMINB12, FOLATE, FERRITIN, TIBC, IRON, RETICCTPCT,  in the last 72 hours  Coagulation profile  Recent Labs Lab 07/07/2013 0500  INR 1.14    No results found for this basename: DDIMER,  in the last 72 hours  Cardiac Enzymes No results found for this basename: CK, CKMB, TROPONINI, MYOGLOBIN,  in the last 168 hours ------------------------------------------------------------------------------------------------------------------ No components found with this basename: POCBNP,   Micro Results Recent Results (from the past  240 hour(s))  CULTURE, BLOOD (ROUTINE X 2)     Status: None   Collection Time    07/23/13 12:55 AM      Result Value Ref Range Status   Specimen Description BLOOD RIGHT ARM   Final   Special Requests BOTTLES DRAWN AEROBIC ONLY [place]   Final   Culture  Setup Time     Final   Value: 07/23/2013 09:02     Performed at [facility]   Culture     Final   Value:        BLOOD CULTURE RECEIVED NO GROWTH TO DATE CULTURE WILL BE HELD FOR 5 DAYS BEFORE ISSUING A FINAL NEGATIVE REPORT     Performed at [facility]   Report Status PENDING   Incomplete  CULTURE, BLOOD (ROUTINE X 2)     Status: None   Collection Time    07/23/13  1:00 AM      Result Value Ref Range Status   Specimen Description BLOOD RIGHT FOREARM   Final   Special Requests BOTTLES DRAWN AEROBIC ONLY 8CC   Final   Culture  Setup Time     Final   Value: 07/23/2013 09:03     Performed at [facility]   Culture     Final   Value:        BLOOD CULTURE RECEIVED NO GROWTH TO DATE CULTURE WILL BE HELD FOR 5 DAYS BEFORE ISSUING A FINAL NEGATIVE REPORT     Performed at [facility]   Report Status PENDING   Incomplete  URINE CULTURE     Status: None   Collection Time    07/23/13  1:27 AM      Result Value Ref Range Status   Specimen Description URINE, CATHETERIZED   Final   Special Requests Normal   Final   Culture  Setup Time     Final   Value: 07/23/2013 01:49     Performed at [place]     Final   Value: NO GROWTH     Performed at [facility]   Culture     Final   Value: NO GROWTH     Performed at [facility]   Report Status 07/24/2013 FINAL   Final  CULTURE, RESPIRATORY (NON-EXPECTORATED)     Status: None   Collection Time    07/23/13  3:09 AM      Result Value Ref Range Status   Specimen Description TRACHEAL ASPIRATE   Final   Special Requests NONE   Final   Gram Stain     Final   Value: RARE WBC PRESENT, PREDOMINANTLY MONONUCLEAR     RARE  SQUAMOUS EPITHELIAL  CELLS PRESENT     FEW GRAM NEGATIVE RODS     RARE GRAM POSITIVE COCCI IN PAIRS     Performed at [facility]   Culture     Final   Value: MODERATE PSEUDOMONAS AERUGINOSA     MODERATE KLEBSIELLA PNEUMONIAE     Performed at [facility]   Report Status 07/26/2013 FINAL   Final   Organism ID, Bacteria PSEUDOMONAS AERUGINOSA   Final   Organism ID, Bacteria KLEBSIELLA PNEUMONIAE   Final       Assessment & Plan    Respiratory failure continue with ATC 40%, continue weaning trials Healthcare associated pneumonia status post treatment History of asthma on neb treatments when necessary Coronary artery disease status post ST elevation MI and ventricular fibrillation with arrest Hypertension Protein calorie malnutrition will consult IR for G-tube placement Anoxic brain injury with encephalopathy status post EEG showing slowing; on fish oil and provigil History of schizophrenia/agitation on Seroquel and Haldol and Ativan when necessary History of seizures on Depakote and Keppra Increased secretions on scopolamine Pseudomonas and Klebsiella tracheobronchitis on Cipro per tube  Plan  Replace potassium Can use a G-tube in a.m..  Code Status: Full   DVT Prophylaxis  heparin   Merton Border M.D on 07/26/2013 at 1:32 PM

## 2013-07-29 LAB — CULTURE, BLOOD (ROUTINE X 2)
Culture: NO GROWTH
Culture: NO GROWTH

## 2013-07-29 LAB — BASIC METABOLIC PANEL
Anion gap: 13 (ref 5–15)
BUN: 14 mg/dL (ref 6–23)
CHLORIDE: 105 meq/L (ref 96–112)
CO2: 26 meq/L (ref 19–32)
CREATININE: 0.57 mg/dL (ref 0.50–1.35)
Calcium: 9.1 mg/dL (ref 8.4–10.5)
GFR calc Af Amer: >90 mL/min (ref >90)
GFR calc non Af Amer: >90 mL/min (ref >90)
GLUCOSE: 94 mg/dL (ref 70–99)
Potassium: 3.9 mEq/L (ref 3.7–5.3)
Sodium: 144 mEq/L (ref 137–147)

## 2013-07-30 NOTE — Progress Notes (Signed)
[facility]                                                                                              Progress note     Patient Demographics  Chase Blackwell, is a 48 y.o. male  RCV:[id number]  [id number]:[id number]  DOB - [birth year]  Admit date - 07/23/2013  Admitting Physician Merton Border, MD  Outpatient Primary MD for the patient is Default, Provider, MD  LOS - 7   Chief complaint   Respiratory failure   Encephalopathy with anoxic brain injury   Protein calorie malnutrition         Subjective:   Chase Blackwell cannot give any history  Objective:   Vital signs  Temperature 97.2 Heart rate 80 Respiratory rate 20  Blood pressure 112/76 Pulse ox 100%    Exam Encephalopathic [place].AT, pupils 2 mm bilaterally with sluggish reaction to light NG tube in place Supple Neck,No JVD, No cervical lymphadenopathy appriciated. Tracheostomy in place Symmetrical Chest wall movement, decreased breath sounds bilaterally with scattered rhonchi RRR,No Gallops,Rubs or new Murmurs, No Parasternal Heave +ve B.Sounds, Abd Soft, Non tender, No organomegaly appriciated, No rebound - guarding or rigidity. PEG tube in place No Cyanosis, Clubbing or edema, No new Rash or bruise    I&Os unknown   Data Review   CBC  Recent Labs Lab 07/23/13 1855 07/24/13 0500 07/25/13 0840 07/26/13 0600 07/24/2013 0500  WBC 14.9* 14.0* 13.0* 11.2* 9.5  HGB 12.3* 11.5* 11.7* 11.1* 11.0*  HCT 38.0* 35.7* 36.5* 35.1* 35.0*  PLT 936* 931* 756* 669* 600*  MCV 101.3* 102.0* 102.8* 100.9* 101.4*  MCH 32.8 32.9 33.0 31.9 31.9  MCHC 32.4 32.2 32.1 31.6 31.4  RDW 15.5 15.4 15.3 15.3 15.2  LYMPHSABS  --  1.5  --   --   --   MONOABS  --  1.9*  --   --   --   EOSABS  --  0.3  --   --   --   BASOSABS  --  0.1  --   --   --     Chemistries   Recent Labs Lab 07/24/13 0500 07/25/2013 0500 07/29/13 0618  NA 145 145 144  K 3.4*  3.3* 3.9  CL 107 104 105  CO2 25 26 26   GLUCOSE 135* 86 94  BUN 28* 22 14  CREATININE 0.55 0.64 0.57  CALCIUM 9.2 8.8 9.1  AST 56*  --   --   ALT 76*  --   --   ALKPHOS 177*  --   --   BILITOT 0.7  --   --    ------------------------------------------------------------------------------------------------------------------ CrCl is unknown because both a height and weight (above a minimum accepted value) are required for this calculation. ------------------------------------------------------------------------------------------------------------------ No results found for this basename: HGBA1C,  in the last 72 hours ------------------------------------------------------------------------------------------------------------------ No results found for this basename: CHOL, HDL, LDLCALC, TRIG, CHOLHDL, LDLDIRECT,  in the last 72 hours ------------------------------------------------------------------------------------------------------------------ No results found for this basename: TSH, T4TOTAL, FREET3, T3FREE, THYROIDAB,  in the last 72 hours ------------------------------------------------------------------------------------------------------------------ No results found for this basename: VITAMINB12, FOLATE, FERRITIN, TIBC,  IRON, RETICCTPCT,  in the last 72 hours  Coagulation profile  Recent Labs Lab 07/26/2013 0500  INR 1.14    No results found for this basename: DDIMER,  in the last 72 hours  Cardiac Enzymes No results found for this basename: CK, CKMB, TROPONINI, MYOGLOBIN,  in the last 168 hours ------------------------------------------------------------------------------------------------------------------ No components found with this basename: POCBNP,   Micro Results Recent Results (from the past 240 hour(s))  CULTURE, BLOOD (ROUTINE X 2)     Status: None   Collection Time    07/23/13 12:55 AM      Result Value Ref Range Status   Specimen Description BLOOD RIGHT ARM    Final   Special Requests BOTTLES DRAWN AEROBIC ONLY [place]   Final   Culture  Setup Time     Final   Value: 07/23/2013 09:02     Performed at [facility]   Culture     Final   Value: NO GROWTH 5 DAYS     Performed at [facility]   Report Status 07/29/2013 FINAL   Final  CULTURE, BLOOD (ROUTINE X 2)     Status: None   Collection Time    07/23/13  1:00 AM      Result Value Ref Range Status   Specimen Description BLOOD RIGHT FOREARM   Final   Special Requests BOTTLES DRAWN AEROBIC ONLY 8[facility]   Final   Culture  Setup Time     Final   Value: 07/23/2013 09:03     Performed at [facility]   Culture     Final   Value: NO GROWTH 5 DAYS     Performed at [facility]   Report Status 07/29/2013 FINAL   Final  URINE CULTURE     Status: None   Collection Time    07/23/13  1:27 AM      Result Value Ref Range Status   Specimen Description URINE, CATHETERIZED   Final   Special Requests Normal   Final   Culture  Setup Time     Final   Value: 07/23/2013 01:49     Performed at [place] [facility]     Final   Value: NO GROWTH     Performed at [facility]   Culture     Final   Value: NO GROWTH     Performed at [facility]   Report Status 07/24/2013 FINAL   Final  CULTURE, RESPIRATORY (NON-EXPECTORATED)     Status: None   Collection Time    07/23/13  3:09 AM      Result Value Ref Range Status   Specimen Description TRACHEAL ASPIRATE   Final   Special Requests NONE   Final   Gram Stain     Final   Value: RARE WBC PRESENT, PREDOMINANTLY MONONUCLEAR     RARE SQUAMOUS EPITHELIAL CELLS PRESENT     FEW GRAM NEGATIVE RODS     RARE GRAM POSITIVE COCCI IN PAIRS     Performed at [facility]   Culture     Final   Value: MODERATE PSEUDOMONAS AERUGINOSA     MODERATE KLEBSIELLA PNEUMONIAE     Performed at [facility]   Report Status 07/26/2013 FINAL   Final   Organism ID, Bacteria PSEUDOMONAS AERUGINOSA   Final    Organism ID, Bacteria KLEBSIELLA PNEUMONIAE   Final       Assessment & Plan    Respiratory  failure continue with ATC 28%,  Healthcare associated pneumonia status post treatment History of asthma on neb treatments when necessary Coronary artery disease status post ST elevation MI and ventricular fibrillation with arrest Hypertension Protein calorie malnutrition will consult IR for G-tube placement Anoxic brain injury with encephalopathy status post EEG showing slowing; on fish oil and provigil History of schizophrenia/agitation on Seroquel and Haldol and Ativan when necessary History of seizures on Depakote and Keppra Increased secretions on scopolamine Pseudomonas and Klebsiella tracheobronchitis on Cipro per tube  Plan  Discontinue Geodon  Code Status: Full   DVT Prophylaxis  heparin   Merton Border M.D on 07/30/2013 at 12:53 PM

## 2013-07-31 LAB — CBC
HEMATOCRIT: 35.8 % — AB (ref 39.0–52.0)
HEMOGLOBIN: 11.5 g/dL — AB (ref 13.0–17.0)
MCH: 31.9 pg (ref 26.0–34.0)
MCHC: 32.1 g/dL (ref 30.0–36.0)
MCV: 99.2 fL (ref 78.0–100.0)
Platelets: 354 10*3/uL (ref 150–400)
RBC: 3.61 MIL/uL — ABNORMAL LOW (ref 4.22–5.81)
RDW: 15 % (ref 11.5–15.5)
WBC: 8.2 10*3/uL (ref 4.0–10.5)

## 2013-07-31 LAB — BASIC METABOLIC PANEL
ANION GAP: 13 (ref 5–15)
BUN: 14 mg/dL (ref 6–23)
CALCIUM: 8.7 mg/dL (ref 8.4–10.5)
CO2: 25 mEq/L (ref 19–32)
CREATININE: 0.69 mg/dL (ref 0.50–1.35)
Chloride: 103 mEq/L (ref 96–112)
GLUCOSE: 90 mg/dL (ref 70–99)
Potassium: 3.6 mEq/L — ABNORMAL LOW (ref 3.7–5.3)
Sodium: 141 mEq/L (ref 137–147)

## 2013-07-31 NOTE — Progress Notes (Signed)
[facility]                                                                                              Progress note     Patient Demographics  Chase Blackwell, is a 48 y.o. male  [id number]  [id number]  DOB - June 27, 1965  Admit date - 07/23/2013  Admitting Physician Merton Border, MD  Outpatient Primary MD for the patient is Default, Provider, MD  LOS - 8   Chief complaint   Respiratory failure   Encephalopathy with anoxic brain injury   Protein calorie malnutrition         Subjective:   Chase Blackwell cannot give any history  Objective:   Vital signs  Temperature 97.1 Heart rate 67 Respiratory rate 20  Blood pressure 95/60 Pulse ox 100%    Exam Awake, encephalopathic [place].AT, pupils 2 mm bilaterally with sluggish reaction to light NG tube in place Supple Neck,No JVD, No cervical lymphadenopathy appriciated. Tracheostomy in place Symmetrical Chest wall movement, decreased breath sounds bilaterally with scattered rhonchi RRR,No Gallops,Rubs or new Murmurs, No Parasternal Heave +ve B.Sounds, Abd Soft, Non tender, No organomegaly appriciated, No rebound - guarding or rigidity. PEG tube in place No Cyanosis, Clubbing or edema, No new Rash or bruise    I&Os +610   Data Review   CBC  Recent Labs Lab 07/25/13 0840 07/26/13 0600 07/28/2013 0500 07/31/13 0449  WBC 13.0* 11.2* 9.5 8.2  HGB 11.7* 11.1* 11.0* 11.5*  HCT 36.5* 35.1* 35.0* 35.8*  PLT 756* 669* 600* 354  MCV 102.8* 100.9* 101.4* 99.2  MCH 33.0 31.9 31.9 31.9  MCHC 32.1 31.6 31.4 32.1  RDW 15.3 15.3 15.2 15.0    Chemistries   Recent Labs Lab 07/10/2013 0500 07/29/13 0618 07/31/13 0449  NA 145 144 141  K 3.3* 3.9 3.6*  CL 104 105 103  CO2 26 26 25   GLUCOSE 86 94 90  BUN 22 14 14   CREATININE 0.64 0.57 0.69  CALCIUM 8.8 9.1 8.7    ------------------------------------------------------------------------------------------------------------------ CrCl is unknown because both a height and weight (above a minimum accepted value) are required for this calculation. ------------------------------------------------------------------------------------------------------------------ No results found for this basename: HGBA1C,  in the last 72 hours ------------------------------------------------------------------------------------------------------------------ No results found for this basename: CHOL, HDL, LDLCALC, TRIG, CHOLHDL, LDLDIRECT,  in the last 72 hours ------------------------------------------------------------------------------------------------------------------ No results found for this basename: TSH, T4TOTAL, FREET3, T3FREE, THYROIDAB,  in the last 72 hours ------------------------------------------------------------------------------------------------------------------ No results found for this basename: VITAMINB12, FOLATE, FERRITIN, TIBC, IRON, RETICCTPCT,  in the last 72 hours  Coagulation profile  Recent Labs Lab 08/03/2013 0500  INR 1.14    No results found for this basename: DDIMER,  in the last 72 hours  Cardiac Enzymes No results found for this basename: CK, CKMB, TROPONINI, MYOGLOBIN,  in the last 168 hours ------------------------------------------------------------------------------------------------------------------ No components found with this basename: POCBNP,   Micro Results Recent Results (from the past 240 hour(s))  CULTURE, BLOOD (ROUTINE X 2)     Status: None   Collection Time    07/23/13 12:55 AM      Result Value Ref Range  Status   Specimen Description BLOOD RIGHT ARM   Final   Special Requests BOTTLES DRAWN AEROBIC ONLY [facility]   Final   Culture  Setup Time     Final   Value: 07/23/2013 09:02     Performed at [facility]   Culture     Final   Value: NO GROWTH 5 DAYS      Performed at [facility]   Report Status 07/29/2013 FINAL   Final  CULTURE, BLOOD (ROUTINE X 2)     Status: None   Collection Time    07/23/13  1:00 AM      Result Value Ref Range Status   Specimen Description BLOOD RIGHT FOREARM   Final   Special Requests BOTTLES DRAWN AEROBIC ONLY 8[facility]   Final   Culture  Setup Time     Final   Value: 07/23/2013 09:03     Performed at [facility]   Culture     Final   Value: NO GROWTH 5 DAYS     Performed at [facility]   Report Status 07/29/2013 FINAL   Final  URINE CULTURE     Status: None   Collection Time    07/23/13  1:27 AM      Result Value Ref Range Status   Specimen Description URINE, CATHETERIZED   Final   Special Requests Normal   Final   Culture  Setup Time     Final   Value: 07/23/2013 01:49     Performed at [place]     Final   Value: NO GROWTH     Performed at [facility]   Culture     Final   Value: NO GROWTH     Performed at [facility]   Report Status 07/24/2013 FINAL   Final  CULTURE, RESPIRATORY (NON-EXPECTORATED)     Status: None   Collection Time    07/23/13  3:09 AM      Result Value Ref Range Status   Specimen Description TRACHEAL ASPIRATE   Final   Special Requests NONE   Final   Gram Stain     Final   Value: RARE WBC PRESENT, PREDOMINANTLY MONONUCLEAR     RARE SQUAMOUS EPITHELIAL CELLS PRESENT     FEW GRAM NEGATIVE RODS     RARE GRAM POSITIVE COCCI IN PAIRS     Performed at [facility]   Culture     Final   Value: MODERATE PSEUDOMONAS AERUGINOSA     MODERATE KLEBSIELLA PNEUMONIAE     Performed at [facility]   Report Status 07/26/2013 FINAL   Final   Organism ID, Bacteria PSEUDOMONAS AERUGINOSA   Final   Organism ID, Bacteria KLEBSIELLA PNEUMONIAE   Final       Assessment & Plan    Respiratory failure continue with ATC 28%,  Healthcare associated pneumonia status post treatment History of asthma on neb  treatments when necessary Coronary artery disease status post ST elevation MI and ventricular fibrillation with arrest Hypertension Protein calorie malnutrition will consult IR for G-tube placement Anoxic brain injury with encephalopathy status post EEG showing slowing; on fish oil and provigil History of schizophrenia/agitation on Seroquel and Haldol and Ativan when necessary History of seizures on Depakote and Keppra Increased secretions on scopolamine Pseudomonas and Klebsiella tracheobronchitis on Cipro per tube  Plan  Start decreasing sedation  Code Status: Full   DVT Prophylaxis  heparin  Merton Border M.D on 07/31/2013 at 1:49 PM

## 2013-08-01 NOTE — Progress Notes (Signed)
[facility]                                                                                              Progress note     Patient Demographics  Chase Blackwell, is a 48 y.o. male  ATF:[id number]  [id number]:[id number]  DOB - February 28, 1965  Admit date - 07/23/2013  Admitting Physician Merton Border, MD  Outpatient Primary MD for the patient is Default, Provider, MD  LOS - 9   Chief complaint   Respiratory failure   Encephalopathy with anoxic brain injury   Protein calorie malnutrition         Subjective:   Chase Blackwell cannot give any history  Objective:   Vital signs  Temperature 98.1 Heart rate 57 Respiratory rate 18  Blood pressure 112/60 Pulse ox 100%    Exam Awake, encephalopathic [place].AT, pupils 5 mm bilaterally with good reaction to light  Supple Neck,No JVD, No cervical lymphadenopathy appriciated. Tracheostomy in place Symmetrical Chest wall movement, decreased breath sounds bilaterally with scattered rhonchi RRR,No Gallops,Rubs or new Murmurs, No Parasternal Heave +ve B.Sounds, Abd Soft, Non tender, No organomegaly appriciated, No rebound - guarding or rigidity. PEG tube in place Right side rash noted in a dermatome under the right shoulder   I&Os +70   Data Review   CBC  Recent Labs Lab 07/26/13 0600 08/01/2013 0500 07/31/13 0449  WBC 11.2* 9.5 8.2  HGB 11.1* 11.0* 11.5*  HCT 35.1* 35.0* 35.8*  PLT 669* 600* 354  MCV 100.9* 101.4* 99.2  MCH 31.9 31.9 31.9  MCHC 31.6 31.4 32.1  RDW 15.3 15.2 15.0    Chemistries   Recent Labs Lab 07/26/2013 0500 07/29/13 0618 07/31/13 0449  NA 145 144 141  K 3.3* 3.9 3.6*  CL 104 105 103  CO2 26 26 25   GLUCOSE 86 94 90  BUN 22 14 14   CREATININE 0.64 0.57 0.69  CALCIUM 8.8 9.1 8.7   ------------------------------------------------------------------------------------------------------------------ CrCl is unknown because both  a height and weight (above a minimum accepted value) are required for this calculation. ------------------------------------------------------------------------------------------------------------------ No results found for this basename: HGBA1C,  in the last 72 hours ------------------------------------------------------------------------------------------------------------------ No results found for this basename: CHOL, HDL, LDLCALC, TRIG, CHOLHDL, LDLDIRECT,  in the last 72 hours ------------------------------------------------------------------------------------------------------------------ No results found for this basename: TSH, T4TOTAL, FREET3, T3FREE, THYROIDAB,  in the last 72 hours ------------------------------------------------------------------------------------------------------------------ No results found for this basename: VITAMINB12, FOLATE, FERRITIN, TIBC, IRON, RETICCTPCT,  in the last 72 hours  Coagulation profile  Recent Labs Lab 07/15/2013 0500  INR 1.14    No results found for this basename: DDIMER,  in the last 72 hours  Cardiac Enzymes No results found for this basename: CK, CKMB, TROPONINI, MYOGLOBIN,  in the last 168 hours ------------------------------------------------------------------------------------------------------------------ No components found with this basename: POCBNP,   Micro Results Recent Results (from the past 240 hour(s))  CULTURE, BLOOD (ROUTINE X 2)     Status: None   Collection Time    07/23/13 12:55 AM      Result Value Ref Range Status   Specimen Description BLOOD RIGHT ARM   Final  Special Requests BOTTLES DRAWN AEROBIC ONLY [facility]   Final   Culture  Setup Time     Final   Value: 07/23/2013 09:02     Performed at [facility]   Culture     Final   Value: NO GROWTH 5 DAYS     Performed at [facility]   Report Status 07/29/2013 FINAL   Final  CULTURE, BLOOD (ROUTINE X 2)     Status: None   Collection Time     07/23/13  1:00 AM      Result Value Ref Range Status   Specimen Description BLOOD RIGHT FOREARM   Final   Special Requests BOTTLES DRAWN AEROBIC ONLY 8[facility]   Final   Culture  Setup Time     Final   Value: 07/23/2013 09:03     Performed at [facility]   Culture     Final   Value: NO GROWTH 5 DAYS     Performed at [facility]   Report Status 07/29/2013 FINAL   Final  URINE CULTURE     Status: None   Collection Time    07/23/13  1:27 AM      Result Value Ref Range Status   Specimen Description URINE, CATHETERIZED   Final   Special Requests Normal   Final   Culture  Setup Time     Final   Value: 07/23/2013 01:49     Performed at [place]     Final   Value: NO GROWTH     Performed at [facility]   Culture     Final   Value: NO GROWTH     Performed at [facility]   Report Status 07/24/2013 FINAL   Final  CULTURE, RESPIRATORY (NON-EXPECTORATED)     Status: None   Collection Time    07/23/13  3:09 AM      Result Value Ref Range Status   Specimen Description TRACHEAL ASPIRATE   Final   Special Requests NONE   Final   Gram Stain     Final   Value: RARE WBC PRESENT, PREDOMINANTLY MONONUCLEAR     RARE SQUAMOUS EPITHELIAL CELLS PRESENT     FEW GRAM NEGATIVE RODS     RARE GRAM POSITIVE COCCI IN PAIRS     Performed at [facility]   Culture     Final   Value: MODERATE PSEUDOMONAS AERUGINOSA     MODERATE KLEBSIELLA PNEUMONIAE     Performed at [facility]   Report Status 07/26/2013 FINAL   Final   Organism ID, Bacteria PSEUDOMONAS AERUGINOSA   Final   Organism ID, Bacteria KLEBSIELLA PNEUMONIAE   Final       Assessment & Plan    Respiratory failure continue with ATC 28%,  Healthcare associated pneumonia status post treatment History of asthma on neb treatments when necessary Coronary artery disease status post ST elevation MI and ventricular fibrillation with arrest Hypertension Protein calorie  malnutrition will consult IR for G-tube placement Anoxic brain injury with encephalopathy status post EEG showing slowing; on fish oil and provigil, slow improvement History of schizophrenia/agitation on Seroquel and Haldol and Ativan when necessary History of seizures on Depakote and Keppra Increased secretions on scopolamine Pseudomonas and Klebsiella tracheobronchitis on Cipro per tube Shingles  Plan  Start Valtrex Continue same treatment  Code Status: Full   DVT Prophylaxis  heparin   Merton Border M.D on 08/01/2013 at 11:48  AM

## 2013-08-02 LAB — VALPROIC ACID LEVEL: VALPROIC ACID LVL: 37.5 ug/mL — AB (ref 50.0–100.0)

## 2013-08-02 NOTE — Progress Notes (Signed)
[facility]                                                                                              Progress note     Patient Demographics  Chase Blackwell, is a 48 y.o. male  [id number]  [id number]:[id number]  DOB - 1965-11-12  Admit date - 07/23/2013  Admitting Physician Merton Border, MD  Outpatient Primary MD for the patient is Default, Provider, MD  LOS - 10   Chief complaint   Respiratory failure   Encephalopathy with anoxic brain injury   Protein calorie malnutrition         Subjective:   [name] cannot give any history . More agitated today  Objective:   Vital signs  Temperature 97.2 Heart rate 65 Respiratory rate 20  Blood pressure 104/76 Pulse ox 98%    Exam Awake, encephalopathic [place].AT, pupils 5 mm bilaterally with good reaction to light  Supple Neck,No JVD, No cervical lymphadenopathy appriciated. Tracheostomy in place Symmetrical Chest wall movement, decreased breath sounds bilaterally with scattered rhonchi RRR,No Gallops,Rubs or new Murmurs, No Parasternal Heave +ve B.Sounds, Abd Soft, Non tender, No organomegaly appriciated, No rebound - guarding or rigidity. PEG tube in place Right side rash noted in a dermatome under the right shoulder   I&Os 840/1050   Data Review   CBC  Recent Labs Lab 07/30/2013 0500 07/31/13 0449  WBC 9.5 8.2  HGB 11.0* 11.5*  HCT 35.0* 35.8*  PLT 600* 354  MCV 101.4* 99.2  MCH 31.9 31.9  MCHC 31.4 32.1  RDW 15.2 15.0    Chemistries   Recent Labs Lab 07/04/2013 0500 07/29/13 0618 07/31/13 0449  NA 145 144 141  K 3.3* 3.9 3.6*  CL 104 105 103  CO2 26 26 25   GLUCOSE 86 94 90  BUN 22 14 14   CREATININE 0.64 0.57 0.69  CALCIUM 8.8 9.1 8.7   ------------------------------------------------------------------------------------------------------------------ CrCl is unknown because both a height and weight (above a  minimum accepted value) are required for this calculation. ------------------------------------------------------------------------------------------------------------------ No results found for this basename: HGBA1C,  in the last 72 hours ------------------------------------------------------------------------------------------------------------------ No results found for this basename: CHOL, HDL, LDLCALC, TRIG, CHOLHDL, LDLDIRECT,  in the last 72 hours ------------------------------------------------------------------------------------------------------------------ No results found for this basename: TSH, T4TOTAL, FREET3, T3FREE, THYROIDAB,  in the last 72 hours ------------------------------------------------------------------------------------------------------------------ No results found for this basename: VITAMINB12, FOLATE, FERRITIN, TIBC, IRON, RETICCTPCT,  in the last 72 hours  Coagulation profile  Recent Labs Lab 08/01/2013 0500  INR 1.14    No results found for this basename: DDIMER,  in the last 72 hours  Cardiac Enzymes No results found for this basename: CK, CKMB, TROPONINI, MYOGLOBIN,  in the last 168 hours ------------------------------------------------------------------------------------------------------------------ No components found with this basename: POCBNP,   Micro Results No results found for this or any previous visit (from the past 240 hour(s)).     Assessment & Plan    Respiratory failure continue with ATC 28%,  Healthcare associated pneumonia status post treatment History of asthma on neb treatments when necessary Coronary artery disease status post ST elevation MI and ventricular fibrillation with  arrest Hypertension Protein calorie malnutrition will consult IR for G-tube placement Anoxic brain injury with encephalopathy status post EEG showing slowing; on fish oil and provigil, History of schizophrenia/agitation on Seroquel, Geodon IM and Haldol  and Ativan when necessary History of seizures on Depakote and Keppra Increased secretions on scopolamine Pseudomonas and Klebsiella tracheobronchitis on Cipro per tube Shingles on Valtrex  Plan  Restart IM Geodon  Code Status: Full   DVT Prophylaxis  heparin   Merton Border M.D on 08/02/2013 at 11:53 AM

## 2013-08-03 NOTE — Progress Notes (Signed)
[facility]                                                                                              Progress note     Patient Demographics  Chase Blackwell, is a 48 y.o. male  [id number]  [id number]  DOB - 01-16-1965  Admit date - 07/23/2013  Admitting Physician Merton Border, MD  Outpatient Primary MD for the patient is Default, Provider, MD  LOS - 17   Chief complaint   Respiratory failure   Encephalopathy with anoxic brain injury   Protein calorie malnutrition         Subjective:   Chase Blackwell cannot give any history . More agitated today  Objective:   Vital signs  Temperature 97.6 Heart rate 83 Respiratory rate 19  Blood pressure 119/42 Pulse ox 96%    Exam Awake, encephalopathic [place].AT, pupils 5 mm bilaterally with good reaction to light  Supple Neck,No JVD, No cervical lymphadenopathy appriciated. Tracheostomy in place Symmetrical Chest wall movement, decreased breath sounds bilaterally with scattered rhonchi RRR,No Gallops,Rubs or new Murmurs, No Parasternal Heave +ve B.Sounds, Abd Soft, Non tender, No organomegaly appriciated, No rebound - guarding or rigidity. PEG tube in place Right side rash noted in a dermatome under the right shoulder   I&Os 840/1050   Data Review   CBC  Recent Labs Lab 07/31/13 0449  WBC 8.2  HGB 11.5*  HCT 35.8*  PLT 354  MCV 99.2  MCH 31.9  MCHC 32.1  RDW 15.0    Chemistries   Recent Labs Lab 07/29/13 0618 07/31/13 0449  NA 144 141  K 3.9 3.6*  CL 105 103  CO2 26 25  GLUCOSE 94 90  BUN 14 14  CREATININE 0.57 0.69  CALCIUM 9.1 8.7   ------------------------------------------------------------------------------------------------------------------ CrCl is unknown because both a height and weight (above a minimum accepted value) are required for this  calculation. ------------------------------------------------------------------------------------------------------------------ No results found for this basename: HGBA1C,  in the last 72 hours ------------------------------------------------------------------------------------------------------------------ No results found for this basename: CHOL, HDL, LDLCALC, TRIG, CHOLHDL, LDLDIRECT,  in the last 72 hours ------------------------------------------------------------------------------------------------------------------ No results found for this basename: TSH, T4TOTAL, FREET3, T3FREE, THYROIDAB,  in the last 72 hours ------------------------------------------------------------------------------------------------------------------ No results found for this basename: VITAMINB12, FOLATE, FERRITIN, TIBC, IRON, RETICCTPCT,  in the last 72 hours  Coagulation profile No results found for this basename: INR, PROTIME,  in the last 168 hours  No results found for this basename: DDIMER,  in the last 72 hours  Cardiac Enzymes No results found for this basename: CK, CKMB, TROPONINI, MYOGLOBIN,  in the last 168 hours ------------------------------------------------------------------------------------------------------------------ No components found with this basename: POCBNP,   Micro Results No results found for this or any previous visit (from the past 240 hour(s)).     Assessment & Plan    Respiratory failure continue with ATC 28%,  Healthcare associated pneumonia status post treatment History of asthma on neb treatments when necessary Coronary artery disease status post ST elevation MI and ventricular fibrillation with arrest Hypertension Protein calorie malnutrition will consult IR for G-tube placement Anoxic brain injury with encephalopathy status  post EEG showing slowing; on fish oil and provigil, History of schizophrenia/agitation on Seroquel, Geodon IM and Haldol and Ativan when  necessary History of seizures on Depakote and Keppra Increased secretions on scopolamine Pseudomonas and Klebsiella tracheobronchitis on Cipro per tube Shingles on Valtrex  Plan  Increase his Depakote dose DC Haldol IV Increase Geodon to twice a day  Code Status: Full   DVT Prophylaxis  heparin   Merton Border M.D on 08/03/2013 at 11:43 AM

## 2013-08-04 DEATH — deceased

## 2013-08-06 NOTE — Progress Notes (Signed)
[facility]                                                                                              Progress note     Patient Demographics  Chase Blackwell, is a 48 y.o. male  [id number]  [id number]  DOB - Jun 05, 1965  Admit date - 07/23/2013  Admitting Physician Merton Border, Blackwell  Outpatient Primary Blackwell for the patient is Chase Blackwell  LOS - 18   Chief complaint   Respiratory failure   Encephalopathy with anoxic brain injury   Protein calorie malnutrition         Subjective:   Chase Blackwell cannot give any history due to encephalopathy  Objective:   Vital signs  Temperature 97.6 Heart rate 100 Respiratory rate 20 to Blood pressure 120/70 Pulse ox 99%    Exam Awake, encephalopathic [place].AT, pupils 5 mm bilaterally with good reaction to light  Supple Neck,No JVD, No cervical lymphadenopathy appriciated. Tracheostomy in place Symmetrical Chest wall movement, decreased breath sounds bilaterally with scattered rhonchi RRR,No Gallops,Rubs or new Murmurs, No Parasternal Heave +ve B.Sounds, Abd Soft, Non tender, No organomegaly appriciated, No rebound - guarding or rigidity. PEG tube in place Right side rash noted in a dermatome under the right shoulder   I&Os +700   Data Review   CBC  Recent Labs Lab 07/31/13 0449  WBC 8.2  HGB 11.5*  HCT 35.8*  PLT 354  MCV 99.2  MCH 31.9  MCHC 32.1  RDW 15.0    Chemistries   Recent Labs Lab 07/31/13 0449  NA 141  K 3.6*  CL 103  CO2 25  GLUCOSE 90  BUN 14  CREATININE 0.69  CALCIUM 8.7   ------------------------------------------------------------------------------------------------------------------ CrCl is unknown because both a height and weight (above a minimum accepted value) are required for this  calculation. ------------------------------------------------------------------------------------------------------------------ No results found for this basename: HGBA1C,  in the last 72 hours ------------------------------------------------------------------------------------------------------------------ No results found for this basename: CHOL, HDL, LDLCALC, TRIG, CHOLHDL, LDLDIRECT,  in the last 72 hours ------------------------------------------------------------------------------------------------------------------ No results found for this basename: TSH, T4TOTAL, FREET3, T3FREE, THYROIDAB,  in the last 72 hours ------------------------------------------------------------------------------------------------------------------ No results found for this basename: VITAMINB12, FOLATE, FERRITIN, TIBC, IRON, RETICCTPCT,  in the last 72 hours  Coagulation profile No results found for this basename: INR, PROTIME,  in the last 168 hours  No results found for this basename: DDIMER,  in the last 72 hours  Cardiac Enzymes No results found for this basename: CK, CKMB, TROPONINI, MYOGLOBIN,  in the last 168 hours ------------------------------------------------------------------------------------------------------------------ No components found with this basename: POCBNP,   Micro Results No results found for this or any previous visit (from the past 240 hour(s)).     Assessment & Plan    Respiratory failure continue with ATC 28%,  Healthcare associated pneumonia status post treatment History of asthma on neb treatments when necessary Coronary artery disease status post ST elevation MI and ventricular fibrillation with arrest Hypertension Protein calorie malnutrition will consult IR for G-tube placement Anoxic brain injury with encephalopathy status post EEG showing slowing; on fish oil and provigil, History of  schizophrenia/agitation on Seroquel, Geodon IM and Haldol and Ativan when  necessary History of seizures on Depakote and Keppra Increased secretions on scopolamine Pseudomonas and Klebsiella tracheobronchitis on Cipro per tube Shingles on Valtrex  Plan  Check labs in a.m. Take down sized  to #6 cuff less  Code Status: Full   DVT Prophylaxis  heparin   Merton Border M.D on 08/06/2013 at 11:04 AM

## 2013-08-07 LAB — BASIC METABOLIC PANEL
ANION GAP: 17 — AB (ref 5–15)
BUN: 14 mg/dL (ref 6–23)
CALCIUM: 9.2 mg/dL (ref 8.4–10.5)
CO2: 26 mEq/L (ref 19–32)
Chloride: 97 mEq/L (ref 96–112)
Creatinine, Ser: 0.78 mg/dL (ref 0.50–1.35)
GFR calc Af Amer: >90 mL/min (ref >90)
GLUCOSE: 95 mg/dL (ref 70–99)
POTASSIUM: 4.3 meq/L (ref 3.7–5.3)
SODIUM: 140 meq/L (ref 137–147)

## 2013-08-07 LAB — CBC
HCT: 42.8 % (ref 39.0–52.0)
HEMOGLOBIN: 13.9 g/dL (ref 13.0–17.0)
MCH: 32.7 pg (ref 26.0–34.0)
MCHC: 32.5 g/dL (ref 30.0–36.0)
MCV: 100.7 fL — ABNORMAL HIGH (ref 78.0–100.0)
PLATELETS: 320 10*3/uL (ref 150–400)
RBC: 4.25 MIL/uL (ref 4.22–5.81)
RDW: 15.4 % (ref 11.5–15.5)
WBC: 8.2 10*3/uL (ref 4.0–10.5)

## 2013-08-07 NOTE — Progress Notes (Signed)
[facility]                                                                                              Progress note     Patient Demographics  Chase Blackwell, is a 48 y.o. male  [id number]  [id number]  DOB - 1965/04/26  Admit date - 07/23/2013  Admitting Physician Merton Border, MD  Outpatient Primary MD for the patient is Default, Provider, MD  LOS - 60   Chief complaint   Respiratory failure   Encephalopathy with anoxic brain injury   Protein calorie malnutrition         Subjective:   Chase Blackwell cannot give any history due to encephalopathy  Objective:   Vital signs  Temperature 97.2 Heart rate 60 Respiratory rate 20 Blood pressure 115/65 Pulse ox 100%    Exam Awake, encephalopathic [place].AT, pupils 5 mm bilaterally with good reaction to light  Supple Neck,No JVD, No cervical lymphadenopathy appriciated. Tracheostomy in place Symmetrical Chest wall movement, decreased breath sounds bilaterally with scattered rhonchi RRR,No Gallops,Rubs or new Murmurs, No Parasternal Heave +ve B.Sounds, Abd Soft, Non tender, No organomegaly appriciated, No rebound - guarding or rigidity. PEG tube in place Right side rash noted in a dermatome under the right shoulder   I&Os +700   Data Review   CBC  Recent Labs Lab 08/07/13 0630  WBC 8.2  HGB 13.9  HCT 42.8  PLT 320  MCV 100.7*  MCH 32.7  MCHC 32.5  RDW 15.4    Chemistries   Recent Labs Lab 08/07/13 0630  NA 140  K 4.3  CL 97  CO2 26  GLUCOSE 95  BUN 14  CREATININE 0.78  CALCIUM 9.2   ------------------------------------------------------------------------------------------------------------------ CrCl is unknown because both a height and weight (above a minimum accepted value) are required for this  calculation. ------------------------------------------------------------------------------------------------------------------ No results found for this basename: HGBA1C,  in the last 72 hours ------------------------------------------------------------------------------------------------------------------ No results found for this basename: CHOL, HDL, LDLCALC, TRIG, CHOLHDL, LDLDIRECT,  in the last 72 hours ------------------------------------------------------------------------------------------------------------------ No results found for this basename: TSH, T4TOTAL, FREET3, T3FREE, THYROIDAB,  in the last 72 hours ------------------------------------------------------------------------------------------------------------------ No results found for this basename: VITAMINB12, FOLATE, FERRITIN, TIBC, IRON, RETICCTPCT,  in the last 72 hours  Coagulation profile No results found for this basename: INR, PROTIME,  in the last 168 hours  No results found for this basename: DDIMER,  in the last 72 hours  Cardiac Enzymes No results found for this basename: CK, CKMB, TROPONINI, MYOGLOBIN,  in the last 168 hours ------------------------------------------------------------------------------------------------------------------ No components found with this basename: POCBNP,   Micro Results No results found for this or any previous visit (from the past 240 hour(s)).     Assessment & Plan    Respiratory failure continue with ATC 28%,  Healthcare associated pneumonia status post treatment History of asthma on neb treatments when necessary Coronary artery disease status post ST elevation MI and ventricular fibrillation with arrest Hypertension Protein calorie malnutrition will consult IR for G-tube placement Anoxic brain injury with encephalopathy status post EEG showing slowing; on fish oil and provigil, History of schizophrenia/agitation  on Seroquel, Geodon IM and Haldol and Ativan when  necessary History of seizures on Depakote and Keppra Increased secretions on scopolamine Pseudomonas and Klebsiella tracheobronchitis on Cipro per tube Shingles on Valtrex  Plan  His Provigil was discontinued yesterday at night, Continue same treatment  Code Status: Full   DVT Prophylaxis  heparin   Merton Border M.D on 08/07/2013 at 11:50 AM

## 2013-08-08 NOTE — Progress Notes (Signed)
[facility]                                                                                              Progress note     Patient Demographics  Chase Blackwell, is a 48 y.o. male  [id number]  [id number]  DOB - 1965-12-19  Admit date - 07/23/2013  Admitting Physician Merton Border, MD  Outpatient Primary MD for the patient is Default, Provider, MD  LOS - 59   Chief complaint   Respiratory failure   Encephalopathy with anoxic brain injury   Protein calorie malnutrition         Subjective:   Chase Blackwell cannot give any history due to encephalopathy  Objective:   Vital signs  Temperature 98.6 Heart rate 83 Respiratory rate 20 Blood pressure 110/66 Pulse ox 100%    Exam Awake, encephalopathic .AT, pupils 5 mm bilaterally with good reaction to light  Supple Neck,No JVD, No cervical lymphadenopathy appriciated. Tracheostomy in place Symmetrical Chest wall movement, decreased breath sounds bilaterally with scattered rhonchi RRR,No Gallops,Rubs or new Murmurs, No Parasternal Heave +ve B.Sounds, Abd Soft, Non tender, No organomegaly appriciated, No rebound - guarding or rigidity. PEG tube in place Right side rash noted in a dermatome under the right shoulder   I&Os + 276   Data Review   CBC  Recent Labs Lab 08/07/13 0630  WBC 8.2  HGB 13.9  HCT 42.8  PLT 320  MCV 100.7*  MCH 32.7  MCHC 32.5  RDW 15.4    Chemistries   Recent Labs Lab 08/07/13 0630  NA 140  K 4.3  CL 97  CO2 26  GLUCOSE 95  BUN 14  CREATININE 0.78  CALCIUM 9.2   ------------------------------------------------------------------------------------------------------------------ CrCl is unknown because both a height and weight (above a minimum accepted value) are required for this  calculation. ------------------------------------------------------------------------------------------------------------------ No results found for this basename: HGBA1C,  in the last 72 hours ------------------------------------------------------------------------------------------------------------------ No results found for this basename: CHOL, HDL, LDLCALC, TRIG, CHOLHDL, LDLDIRECT,  in the last 72 hours ------------------------------------------------------------------------------------------------------------------ No results found for this basename: TSH, T4TOTAL, FREET3, T3FREE, THYROIDAB,  in the last 72 hours ------------------------------------------------------------------------------------------------------------------ No results found for this basename: VITAMINB12, FOLATE, FERRITIN, TIBC, IRON, RETICCTPCT,  in the last 72 hours  Coagulation profile No results found for this basename: INR, PROTIME,  in the last 168 hours  No results found for this basename: DDIMER,  in the last 72 hours  Cardiac Enzymes No results found for this basename: CK, CKMB, TROPONINI, MYOGLOBIN,  in the last 168 hours ------------------------------------------------------------------------------------------------------------------ No components found with this basename: POCBNP,   Micro Results No results found for this or any previous visit (from the past 240 hour(s)).     Assessment & Plan    Respiratory failure continue with ATC 28%,  Healthcare associated pneumonia status post treatment History of asthma on neb treatments when necessary Coronary artery disease status post ST elevation MI and ventricular fibrillation with arrest Hypertension Protein calorie malnutrition will consult IR for G-tube placement Anoxic brain injury with encephalopathy status post EEG showing slowing; on fish oil and provigil, History of  schizophrenia/agitation on Seroquel, Geodon IM and Haldol and Ativan when  necessary History of seizures on Depakote and Keppra Increased secretions on scopolamine Pseudomonas and Klebsiella tracheobronchitis on Cipro per tube Shingles on Valtrex  Plan   Continue same treatment  Code Status: Full   DVT Prophylaxis  heparin   Merton Border M.D on 08/08/2013 at 12:16 PM

## 2013-08-09 NOTE — Progress Notes (Signed)
[facility]                                                                                              Progress note     Patient Demographics  Chase Blackwell, is a 48 y.o. male  [id number]  [id number]  DOB - 13-Feb-1965  Admit date - 07/23/2013  Admitting Physician Merton Border, MD  Outpatient Primary MD for the patient is Default, Provider, MD  LOS - 33   Chief complaint   Respiratory failure   Encephalopathy with anoxic brain injury   Protein calorie malnutrition         Subjective:   Chase Blackwell cannot give any history due to encephalopathy  Objective:   Vital signs  Temperature 98 Heart rate 77 Respiratory rate 19 Blood pressure 123/70 Pulse ox 100%    Exam Awake, encephalopathic [place].AT, pupils 5 mm bilaterally with good reaction to light  Supple Neck,No JVD, No cervical lymphadenopathy appriciated. Tracheostomy in place Symmetrical Chest wall movement, decreased breath sounds bilaterally with scattered rhonchi RRR,No Gallops,Rubs or new Murmurs, No Parasternal Heave +ve B.Sounds, Abd Soft, Non tender, No organomegaly appriciated, No rebound - guarding or rigidity. PEG tube in place Right side rash noted in a dermatome under the right shoulder   I&Os 2080/1525   Data Review   CBC  Recent Labs Lab 08/07/13 0630  WBC 8.2  HGB 13.9  HCT 42.8  PLT 320  MCV 100.7*  MCH 32.7  MCHC 32.5  RDW 15.4    Chemistries   Recent Labs Lab 08/07/13 0630  NA 140  K 4.3  CL 97  CO2 26  GLUCOSE 95  BUN 14  CREATININE 0.78  CALCIUM 9.2   ------------------------------------------------------------------------------------------------------------------ CrCl is unknown because both a height and weight (above a minimum accepted value) are required for this  calculation. ------------------------------------------------------------------------------------------------------------------ No results found for this basename: HGBA1C,  in the last 72 hours ------------------------------------------------------------------------------------------------------------------ No results found for this basename: CHOL, HDL, LDLCALC, TRIG, CHOLHDL, LDLDIRECT,  in the last 72 hours ------------------------------------------------------------------------------------------------------------------ No results found for this basename: TSH, T4TOTAL, FREET3, T3FREE, THYROIDAB,  in the last 72 hours ------------------------------------------------------------------------------------------------------------------ No results found for this basename: VITAMINB12, FOLATE, FERRITIN, TIBC, IRON, RETICCTPCT,  in the last 72 hours  Coagulation profile No results found for this basename: INR, PROTIME,  in the last 168 hours  No results found for this basename: DDIMER,  in the last 72 hours  Cardiac Enzymes No results found for this basename: CK, CKMB, TROPONINI, MYOGLOBIN,  in the last 168 hours ------------------------------------------------------------------------------------------------------------------ No components found with this basename: POCBNP,   Micro Results No results found for this or any previous visit (from the past 240 hour(s)).     Assessment & Plan    Respiratory failure continue with ATC 28%,  Healthcare associated pneumonia status post treatment History of asthma on neb treatments when necessary Coronary artery disease status post ST elevation MI and ventricular fibrillation with arrest Hypertension controlled Protein calorie malnutrition will consult IR for G-tube placement Anoxic brain injury with encephalopathy status post EEG showing slowing; on fish oil and provigil, no improvement  History of schizophrenia/agitation on Seroquel, Geodon IM and  Haldol and Ativan when necessary History of seizures on Depakote and Keppra Increased secretions on scopolamine Pseudomonas and Klebsiella tracheobronchitis on Cipro per tube Shingles on Valtrex  Plan   Continue same treatment  Code Status: Full   DVT Prophylaxis  heparin   Merton Border M.D on 08/09/2013 at 2:11 PM

## 2013-08-10 NOTE — Progress Notes (Signed)
[facility]                                                                                              Progress note     Patient Demographics  Chase Blackwell, is a 48 y.o. male  [id number]  [id number]  DOB - [birth year]  Admit date - 07/23/2013  Admitting Physician Merton Border, MD  Outpatient Primary MD for the patient is Default, Provider, MD  LOS - 48   Chief complaint   Respiratory failure   Encephalopathy with anoxic brain injury   Protein calorie malnutrition         Subjective:   Chase Blackwell cannot give any history due to encephalopathy  Objective:   Vital signs  Temperature 98.3 Heart rate 54 Respiratory rate 19 Blood pressure 102/73 Pulse ox 97%    Exam Awake, encephalopathic [place].AT, pupils 5 mm bilaterally with good reaction to light  Supple Neck,No JVD, No cervical lymphadenopathy appriciated. Tracheostomy in place Symmetrical Chest wall movement, decreased breath sounds bilaterally with scattered rhonchi RRR,No Gallops,Rubs or new Murmurs, No Parasternal Heave +ve B.Sounds, Abd Soft, Non tender, No organomegaly appriciated, No rebound - guarding or rigidity. PEG tube in place Right side rash noted in a dermatome under the right shoulder   I&Os 2363/2150   Data Review   CBC  Recent Labs Lab 08/07/13 0630  WBC 8.2  HGB 13.9  HCT 42.8  PLT 320  MCV 100.7*  MCH 32.7  MCHC 32.5  RDW 15.4    Chemistries   Recent Labs Lab 08/07/13 0630  NA 140  K 4.3  CL 97  CO2 26  GLUCOSE 95  BUN 14  CREATININE 0.78  CALCIUM 9.2   ------------------------------------------------------------------------------------------------------------------ CrCl is unknown because both a height and weight (above a minimum accepted value) are required for this  calculation. ------------------------------------------------------------------------------------------------------------------ No results found for this basename: HGBA1C,  in the last 72 hours ------------------------------------------------------------------------------------------------------------------ No results found for this basename: CHOL, HDL, LDLCALC, TRIG, CHOLHDL, LDLDIRECT,  in the last 72 hours ------------------------------------------------------------------------------------------------------------------ No results found for this basename: TSH, T4TOTAL, FREET3, T3FREE, THYROIDAB,  in the last 72 hours ------------------------------------------------------------------------------------------------------------------ No results found for this basename: VITAMINB12, FOLATE, FERRITIN, TIBC, IRON, RETICCTPCT,  in the last 72 hours  Coagulation profile No results found for this basename: INR, PROTIME,  in the last 168 hours  No results found for this basename: DDIMER,  in the last 72 hours  Cardiac Enzymes No results found for this basename: CK, CKMB, TROPONINI, MYOGLOBIN,  in the last 168 hours ------------------------------------------------------------------------------------------------------------------ No components found with this basename: POCBNP,   Micro Results No results found for this or any previous visit (from the past 240 hour(s)).     Assessment & Plan    Respiratory failure continue with ATC 28%,  Healthcare associated pneumonia status post treatment History of asthma on neb treatments when necessary Coronary artery disease status post ST elevation MI and ventricular fibrillation with arrest Hypertension controlled Protein calorie malnutrition will consult IR for G-tube placement Anoxic brain injury with encephalopathy status post EEG showing slowing; on fish oil and provigil, no improvement  History of schizophrenia/agitation on Seroquel, Geodon IM and  Haldol and Ativan when necessary History of seizures on Depakote and Keppra Increased secretions on scopolamine Pseudomonas and Klebsiella tracheobronchitis on Cipro per tube Shingles on Valtrex  Plan   Continue same treatment  Code Status: Full   DVT Prophylaxis  heparin   Merton Border M.D on 08/10/2013 at 11:38 AM

## 2013-08-11 NOTE — Progress Notes (Signed)
[facility]                                                                                              Progress note     Patient Demographics  Chase Blackwell, is a 48 y.o. male  [id number]:[id number]  [id number]  DOB - September 10, 1965  Admit date - 07/23/2013  Admitting Physician Merton Border, MD  Outpatient Primary MD for the patient is Default, Provider, MD  LOS - 33   Chief complaint   Respiratory failure   Encephalopathy with anoxic brain injury   Protein calorie malnutrition         Subjective:   Chase Blackwell cannot give any history due to encephalopathy. Still agitated.  Objective:   Vital signs  Temperature 98.1 Heart rate 80 Respiratory rate 18 Blood pressure 110/72 Pulse ox 97%    Exam Awake, encephalopathic [place].AT, pupils 5 mm bilaterally with good reaction to light  Supple Neck,No JVD, No cervical lymphadenopathy appriciated. Tracheostomy in place Symmetrical Chest wall movement, decreased breath sounds bilaterally with scattered rhonchi RRR,No Gallops,Rubs or new Murmurs, No Parasternal Heave +ve B.Sounds, Abd Soft, Non tender, No organomegaly appriciated, No rebound - guarding or rigidity. PEG tube in place Right side rash noted in a dermatome under the right shoulder   I&Os 2720/1070   Data Review   CBC  Recent Labs Lab 08/07/13 0630  WBC 8.2  HGB 13.9  HCT 42.8  PLT 320  MCV 100.7*  MCH 32.7  MCHC 32.5  RDW 15.4    Chemistries   Recent Labs Lab 08/07/13 0630  NA 140  K 4.3  CL 97  CO2 26  GLUCOSE 95  BUN 14  CREATININE 0.78  CALCIUM 9.2   ------------------------------------------------------------------------------------------------------------------ CrCl is unknown because both a height and weight (above a minimum accepted value) are required for this  calculation. ------------------------------------------------------------------------------------------------------------------ No results found for this basename: HGBA1C,  in the last 72 hours ------------------------------------------------------------------------------------------------------------------ No results found for this basename: CHOL, HDL, LDLCALC, TRIG, CHOLHDL, LDLDIRECT,  in the last 72 hours ------------------------------------------------------------------------------------------------------------------ No results found for this basename: TSH, T4TOTAL, FREET3, T3FREE, THYROIDAB,  in the last 72 hours ------------------------------------------------------------------------------------------------------------------ No results found for this basename: VITAMINB12, FOLATE, FERRITIN, TIBC, IRON, RETICCTPCT,  in the last 72 hours  Coagulation profile No results found for this basename: INR, PROTIME,  in the last 168 hours  No results found for this basename: DDIMER,  in the last 72 hours  Cardiac Enzymes No results found for this basename: CK, CKMB, TROPONINI, MYOGLOBIN,  in the last 168 hours ------------------------------------------------------------------------------------------------------------------ No components found with this basename: POCBNP,   Micro Results No results found for this or any previous visit (from the past 240 hour(s)).     Assessment & Plan    Respiratory failure continue with ATC 28%,  Healthcare associated pneumonia status post treatment History of asthma on neb treatments when necessary Coronary artery disease status post ST elevation MI and ventricular fibrillation with arrest Hypertension controlled Protein calorie malnutrition will consult IR for G-tube placement Anoxic brain injury with encephalopathy status post EEG showing slowing; on fish oil and provigil,  no improvement History of schizophrenia/agitation on Seroquel, Geodon IM and  Haldol and Ativan when necessary History of seizures on Depakote and Keppra Increased secretions on scopolamine Pseudomonas and Klebsiella tracheobronchitis on Cipro per tube Shingles on Valtrex  Plan   Add BuSpar 10 mg per tube twice a day  Code Status: Full   DVT Prophylaxis  heparin   Merton Border M.D on 08/11/2013 at 2:43 PM

## 2013-08-12 NOTE — Progress Notes (Signed)
[facility]                                                                                              Progress note     Patient Demographics  Chase Blackwell, is a 48 y.o. male  OHY:[id number]  [id number]:[id number]  DOB - 25-Jan-1965  Admit date - 07/23/2013  Admitting Physician Merton Border, MD  Outpatient Primary MD for the patient is Default, Provider, MD  LOS - 42   Chief complaint   Respiratory failure   Encephalopathy with anoxic brain injury   Protein calorie malnutrition         Subjective:   [name] cannot give any history due to encephalopathy. Still agitated.  Objective:   Vital signs  Temperature 99.4 Heart rate 92 Respiratory rate 18 Blood pressure 128/78 Pulse ox 98%    Exam  encephalopathic [place].AT, pupils 5 mm bilaterally with good reaction to light  Supple Neck,No JVD, No cervical lymphadenopathy appriciated. Tracheostomy in place Symmetrical Chest wall movement, decreased breath sounds bilaterally with scattered rhonchi RRR,No Gallops,Rubs or new Murmurs, No Parasternal Heave +ve B.Sounds, Abd Soft, Non tender, No organomegaly appriciated, No rebound - guarding or rigidity. PEG tube in place Right side rash noted in a dermatome under the right shoulder   I&Os 2084   Data Review   CBC  Recent Labs Lab 08/07/13 0630  WBC 8.2  HGB 13.9  HCT 42.8  PLT 320  MCV 100.7*  MCH 32.7  MCHC 32.5  RDW 15.4    Chemistries   Recent Labs Lab 08/07/13 0630  NA 140  K 4.3  CL 97  CO2 26  GLUCOSE 95  BUN 14  CREATININE 0.78  CALCIUM 9.2   ------------------------------------------------------------------------------------------------------------------ CrCl is unknown because both a height and weight (above a minimum accepted value) are required for this  calculation. ------------------------------------------------------------------------------------------------------------------ No results found for this basename: HGBA1C,  in the last 72 hours ------------------------------------------------------------------------------------------------------------------ No results found for this basename: CHOL, HDL, LDLCALC, TRIG, CHOLHDL, LDLDIRECT,  in the last 72 hours ------------------------------------------------------------------------------------------------------------------ No results found for this basename: TSH, T4TOTAL, FREET3, T3FREE, THYROIDAB,  in the last 72 hours ------------------------------------------------------------------------------------------------------------------ No results found for this basename: VITAMINB12, FOLATE, FERRITIN, TIBC, IRON, RETICCTPCT,  in the last 72 hours  Coagulation profile No results found for this basename: INR, PROTIME,  in the last 168 hours  No results found for this basename: DDIMER,  in the last 72 hours  Cardiac Enzymes No results found for this basename: CK, CKMB, TROPONINI, MYOGLOBIN,  in the last 168 hours ------------------------------------------------------------------------------------------------------------------ No components found with this basename: POCBNP,   Micro Results No results found for this or any previous visit (from the past 240 hour(s)).     Assessment & Plan    Respiratory failure continue with ATC 28%,  Healthcare associated pneumonia status post treatment History of asthma on neb treatments when necessary Coronary artery disease status post ST elevation MI and ventricular fibrillation with arrest Hypertension controlled Protein calorie malnutrition will consult IR for G-tube placement Anoxic brain injury with encephalopathy status post EEG showing slowing; on fish oil and provigil,  no improvement History of schizophrenia/agitation on Seroquel, Geodon IM and  Haldol and Ativan when necessary History of seizures on Depakote and Keppra Increased secretions on scopolamine Pseudomonas and Klebsiella tracheobronchitis on Cipro per tube Shingles on Valtrex  Plan   Add BuSpar 10 mg per tube twice a day  Code Status: Full   DVT Prophylaxis  heparin   Merton Border M.D on 08/12/2013 at 12:22 PM

## 2013-08-13 NOTE — Progress Notes (Signed)
[facility]                                                                                              Progress note     Patient Demographics  Chase Blackwell, is a 48 y.o. male  KXF:[id number]  [id number]  DOB - [birth year]  Admit date - 07/23/2013  Admitting Physician Merton Border, MD  Outpatient Primary MD for the patient is Default, Provider, MD  LOS - 21   Chief complaint   Respiratory failure   Encephalopathy with anoxic brain injury   Protein calorie malnutrition         Subjective:   Chase Blackwell cannot give any history due to encephalopathy. See dictated  Objective:   Vital signs  Temperature 98.5 Heart rate 55 Respiratory rate 18 Blood pressure 134/83 Pulse ox 98%    Exam  encephalopathic [place].AT, pupils 5 mm bilaterally with good reaction to light  Supple Neck,No JVD, No cervical lymphadenopathy appriciated. Tracheostomy in place Symmetrical Chest wall movement, decreased breath sounds bilaterally with scattered rhonchi RRR,No Gallops,Rubs or new Murmurs, No Parasternal Heave +ve B.Sounds, Abd Soft, Non tender, No organomegaly appriciated, No rebound - guarding or rigidity. PEG tube in place Right side rash noted in a dermatome under the right shoulder   I&Os 2084   Data Review   CBC  Recent Labs Lab 08/07/13 0630  WBC 8.2  HGB 13.9  HCT 42.8  PLT 320  MCV 100.7*  MCH 32.7  MCHC 32.5  RDW 15.4    Chemistries   Recent Labs Lab 08/07/13 0630  NA 140  K 4.3  CL 97  CO2 26  GLUCOSE 95  BUN 14  CREATININE 0.78  CALCIUM 9.2   ------------------------------------------------------------------------------------------------------------------ CrCl is unknown because both a height and weight (above a minimum accepted value) are required for this  calculation. ------------------------------------------------------------------------------------------------------------------ No results found for this basename: HGBA1C,  in the last 72 hours ------------------------------------------------------------------------------------------------------------------ No results found for this basename: CHOL, HDL, LDLCALC, TRIG, CHOLHDL, LDLDIRECT,  in the last 72 hours ------------------------------------------------------------------------------------------------------------------ No results found for this basename: TSH, T4TOTAL, FREET3, T3FREE, THYROIDAB,  in the last 72 hours ------------------------------------------------------------------------------------------------------------------ No results found for this basename: VITAMINB12, FOLATE, FERRITIN, TIBC, IRON, RETICCTPCT,  in the last 72 hours  Coagulation profile No results found for this basename: INR, PROTIME,  in the last 168 hours  No results found for this basename: DDIMER,  in the last 72 hours  Cardiac Enzymes No results found for this basename: CK, CKMB, TROPONINI, MYOGLOBIN,  in the last 168 hours ------------------------------------------------------------------------------------------------------------------ No components found with this basename: POCBNP,   Micro Results No results found for this or any previous visit (from the past 240 hour(s)).     Assessment & Plan    Respiratory failure continue with ATC 28%,  Healthcare associated pneumonia status post treatment History of asthma on neb treatments when necessary Coronary artery disease status post ST elevation MI and ventricular fibrillation with arrest Hypertension controlled Protein calorie malnutrition will consult IR for G-tube placement Anoxic brain injury with encephalopathy status post EEG showing slowing; on fish oil and provigil,  no improvement History of schizophrenia/agitation on Seroquel, Geodon IM and  Haldol and Ativan when necessary History of seizures on Depakote and Keppra Increased secretions on scopolamine Pseudomonas and Klebsiella tracheobronchitis on Cipro per tube Shingles on Valtrex  Plan   Check labs in a.m.  Code Status: Full   DVT Prophylaxis  heparin   Merton Border M.D on 08/13/2013 at 10:06 AM

## 2013-08-14 LAB — CBC
HCT: 36.7 % — ABNORMAL LOW (ref 39.0–52.0)
Hemoglobin: 12 g/dL — ABNORMAL LOW (ref 13.0–17.0)
MCH: 32.3 pg (ref 26.0–34.0)
MCHC: 32.7 g/dL (ref 30.0–36.0)
MCV: 98.7 fL (ref 78.0–100.0)
PLATELETS: 345 10*3/uL (ref 150–400)
RBC: 3.72 MIL/uL — AB (ref 4.22–5.81)
RDW: 15.2 % (ref 11.5–15.5)
WBC: 9.6 10*3/uL (ref 4.0–10.5)

## 2013-08-14 LAB — BASIC METABOLIC PANEL
ANION GAP: 14 (ref 5–15)
BUN: 14 mg/dL (ref 6–23)
CHLORIDE: 100 meq/L (ref 96–112)
CO2: 29 meq/L (ref 19–32)
CREATININE: 0.69 mg/dL (ref 0.50–1.35)
Calcium: 9.3 mg/dL (ref 8.4–10.5)
GFR calc Af Amer: >90 mL/min (ref >90)
GFR calc non Af Amer: >90 mL/min (ref >90)
Glucose, Bld: 111 mg/dL — ABNORMAL HIGH (ref 70–99)
POTASSIUM: 4.2 meq/L (ref 3.7–5.3)
Sodium: 143 mEq/L (ref 137–147)

## 2013-08-14 NOTE — Progress Notes (Signed)
[facility]                                                                                              Progress note     Patient Demographics  Chase Blackwell, is a 48 y.o. male  [id number]  [id number]  DOB - July 16, 1965  Admit date - 07/23/2013  Admitting Physician Merton Border, MD  Outpatient Primary MD for the patient is Default, Provider, MD  LOS - 4   Chief complaint   Respiratory failure   Encephalopathy with anoxic brain injury   Protein calorie malnutrition         Subjective:   Devaris Cressler cannot give any history due to encephalopathy. Still agitated  Objective:   Vital signs  Temperature 98 Heart rate 88 Respiratory rate 18 Blood pressure 120/73 Pulse ox 100%    Exam  encephalopathic [place].AT, pupils 5 mm bilaterally with good reaction to light  Supple Neck,No JVD, No cervical lymphadenopathy appriciated. Tracheostomy in place Symmetrical Chest wall movement, decreased breath sounds bilaterally with scattered rhonchi RRR,No Gallops,Rubs or new Murmurs, No Parasternal Heave +ve B.Sounds, Abd Soft, Non tender, No organomegaly appriciated, No rebound - guarding or rigidity. PEG tube in place Right side rash noted in a dermatome under the right shoulder   I&Os unknown   Data Review   CBC  Recent Labs Lab 08/14/13 0423  WBC 9.6  HGB 12.0*  HCT 36.7*  PLT 345  MCV 98.7  MCH 32.3  MCHC 32.7  RDW 15.2    Chemistries   Recent Labs Lab 08/14/13 0423  NA 143  K 4.2  CL 100  CO2 29  GLUCOSE 111*  BUN 14  CREATININE 0.69  CALCIUM 9.3   ------------------------------------------------------------------------------------------------------------------ CrCl is unknown because both a height and weight (above a minimum accepted value) are required for this  calculation. ------------------------------------------------------------------------------------------------------------------ No results found for this basename: HGBA1C,  in the last 72 hours ------------------------------------------------------------------------------------------------------------------ No results found for this basename: CHOL, HDL, LDLCALC, TRIG, CHOLHDL, LDLDIRECT,  in the last 72 hours ------------------------------------------------------------------------------------------------------------------ No results found for this basename: TSH, T4TOTAL, FREET3, T3FREE, THYROIDAB,  in the last 72 hours ------------------------------------------------------------------------------------------------------------------ No results found for this basename: VITAMINB12, FOLATE, FERRITIN, TIBC, IRON, RETICCTPCT,  in the last 72 hours  Coagulation profile No results found for this basename: INR, PROTIME,  in the last 168 hours  No results found for this basename: DDIMER,  in the last 72 hours  Cardiac Enzymes No results found for this basename: CK, CKMB, TROPONINI, MYOGLOBIN,  in the last 168 hours ------------------------------------------------------------------------------------------------------------------ No components found with this basename: POCBNP,   Micro Results No results found for this or any previous visit (from the past 240 hour(s)).     Assessment & Plan    Respiratory failure continue with ATC 28%, off the ventilator Healthcare associated pneumonia status post treatment History of asthma on neb treatments when necessary Coronary artery disease status post ST elevation MI and ventricular fibrillation with arrest and resulting encephalopathy Hypertension controlled Protein calorie malnutrition will consult IR for G-tube placement Anoxic brain injury with encephalopathy status post EEG showing slowing;  on fish oil and provigil, no improvement History of  schizophrenia/agitation on Seroquel, Geodon IM and Haldol and Ativan when necessary and still in restraints History of seizures on Depakote and Keppra Increased secretions on scopolamine Pseudomonas and Klebsiella tracheobronchitis treated Shingles on Valtrex  Plan  We'll need to try to take off his restraints Continue same treatment.  Code Status: Full   DVT Prophylaxis  heparin   Merton Border M.D on 08/14/2013 at 2:09 PM

## 2013-08-16 LAB — VALPROIC ACID LEVEL: Valproic Acid Lvl: 25.8 ug/mL — ABNORMAL LOW (ref 50.0–100.0)

## 2013-08-20 LAB — CBC
HCT: 39 % (ref 39.0–52.0)
Hemoglobin: 12.7 g/dL — ABNORMAL LOW (ref 13.0–17.0)
MCH: 32.9 pg (ref 26.0–34.0)
MCHC: 32.6 g/dL (ref 30.0–36.0)
MCV: 101 fL — AB (ref 78.0–100.0)
PLATELETS: 278 10*3/uL (ref 150–400)
RBC: 3.86 MIL/uL — AB (ref 4.22–5.81)
RDW: 15.3 % (ref 11.5–15.5)
WBC: 5.7 10*3/uL (ref 4.0–10.5)

## 2013-08-20 LAB — BASIC METABOLIC PANEL
ANION GAP: 13 (ref 5–15)
BUN: 15 mg/dL (ref 6–23)
CHLORIDE: 103 meq/L (ref 96–112)
CO2: 27 mEq/L (ref 19–32)
CREATININE: 0.73 mg/dL (ref 0.50–1.35)
Calcium: 9.4 mg/dL (ref 8.4–10.5)
GFR calc non Af Amer: >90 mL/min (ref >90)
Glucose, Bld: 99 mg/dL (ref 70–99)
Potassium: 4.2 mEq/L (ref 3.7–5.3)
SODIUM: 143 meq/L (ref 137–147)

## 2013-08-20 LAB — PREALBUMIN: PREALBUMIN: 24.9 mg/dL (ref 17.0–34.0)

## 2013-08-20 NOTE — Progress Notes (Addendum)
[facility]                                                                                              Progress note     Patient Demographics  Chase Blackwell, is a 48 y.o. male  PPI:[id number]  [id number]  DOB - October 16, 1965  Admit date - 07/23/2013  Admitting Physician Merton Border, MD  Outpatient Primary MD for the patient is Default, Provider, MD  LOS - 40   Chief complaint   Respiratory failure   Encephalopathy with anoxic brain injury   Protein calorie malnutrition         Subjective:   Chase Blackwell more alert awake but confused. Denies chest pain shortness of breath denies nausea vomiting or diarrhea. Wants to eat .  Objective:   Vital signs  Temperature  97.8  Heart rate  64  Respiratory rate  20  Blood pressure  108/68  Pulse ox  98%     Exam   alert awake, communicating  [place].AT, pupils 5 mm bilaterally with good reaction to light  Supple Neck,No JVD, No cervical lymphadenopathy appriciated. Tracheostomy in place, capped Symmetrical Chest wall movement, decreased breath sounds bilaterally with scattered rhonchi RRR,No Gallops,Rubs or new Murmurs, No Parasternal Heave +ve B.Sounds, Abd Soft, Non tender, No organomegaly appriciated, No rebound - guarding or rigidity. PEG tube in place Right side rash noted in a dermatome under the right shoulder   I&Os  +2424    Data Review   CBC  Recent Labs Lab 08/14/13 0423 08/20/13 0620  WBC 9.6 5.7  HGB 12.0* 12.7*  HCT 36.7* 39.0  PLT 345 278  MCV 98.7 101.0*  MCH 32.3 32.9  MCHC 32.7 32.6  RDW 15.2 15.3    Chemistries   Recent Labs Lab 08/14/13 0423 08/20/13 0620  NA 143 143  K 4.2 4.2  CL 100 103  CO2 29 27  GLUCOSE 111* 99  BUN 14 15  CREATININE 0.69 0.73  CALCIUM 9.3 9.4   ------------------------------------------------------------------------------------------------------------------ CrCl is  unknown because both a height and weight (above a minimum accepted value) are required for this calculation. ------------------------------------------------------------------------------------------------------------------ No results found for this basename: HGBA1C,  in the last 72 hours ------------------------------------------------------------------------------------------------------------------ No results found for this basename: CHOL, HDL, LDLCALC, TRIG, CHOLHDL, LDLDIRECT,  in the last 72 hours ------------------------------------------------------------------------------------------------------------------ No results found for this basename: TSH, T4TOTAL, FREET3, T3FREE, THYROIDAB,  in the last 72 hours ------------------------------------------------------------------------------------------------------------------ No results found for this basename: VITAMINB12, FOLATE, FERRITIN, TIBC, IRON, RETICCTPCT,  in the last 72 hours  Coagulation profile No results found for this basename: INR, PROTIME,  in the last 168 hours  No results found for this basename: DDIMER,  in the last 72 hours  Cardiac Enzymes No results found for this basename: CK, CKMB, TROPONINI, MYOGLOBIN,  in the last 168 hours ------------------------------------------------------------------------------------------------------------------ No components found with this basename: POCBNP,   Micro Results No results found for this or any previous visit (from the past 240 hour(s)).     Assessment & Plan    Respiratory failure continue with ATC 28%, off the ventilator tolerating capping Healthcare  associated pneumonia status post treatment History of asthma on neb treatments when necessary Coronary artery disease status post ST elevation MI and ventricular fibrillation with arrest and resulting encephalopathy Hypertension controlled Protein calorie malnutrition  status post  G-tube placement Anoxic brain injury  with encephalopathy status post EEG showing slowing; on fish oil and provigil, no improvement History of schizophrenia/agitation on Seroquel,Haldol and  Ativan when necessary  off restraints ,1:1 History of seizures on Depakote and Keppra Increased secretions off scopolamine Pseudomonas and Klebsiella tracheobronchitis treated Shingles Treated   Plan  DC scopolamine and clonazepam Decrease Haldol to 2 mg daily Start Ambien at night. Melatonin  Code Status: Full   DVT Prophylaxis  heparin   Merton Border M.D on 08/20/2013 at 11:21 AM

## 2013-08-21 LAB — CBC
HCT: 36.8 % — ABNORMAL LOW (ref 39.0–52.0)
HEMOGLOBIN: 11.7 g/dL — AB (ref 13.0–17.0)
MCH: 31.9 pg (ref 26.0–34.0)
MCHC: 31.8 g/dL (ref 30.0–36.0)
MCV: 100.3 fL — ABNORMAL HIGH (ref 78.0–100.0)
Platelets: 295 10*3/uL (ref 150–400)
RBC: 3.67 MIL/uL — ABNORMAL LOW (ref 4.22–5.81)
RDW: 15.6 % — ABNORMAL HIGH (ref 11.5–15.5)
WBC: 6.1 10*3/uL (ref 4.0–10.5)

## 2013-08-21 LAB — BASIC METABOLIC PANEL
ANION GAP: 11 (ref 5–15)
BUN: 17 mg/dL (ref 6–23)
CO2: 28 meq/L (ref 19–32)
CREATININE: 0.86 mg/dL (ref 0.50–1.35)
Calcium: 9.2 mg/dL (ref 8.4–10.5)
Chloride: 102 mEq/L (ref 96–112)
GFR calc non Af Amer: >90 mL/min (ref >90)
Glucose, Bld: 79 mg/dL (ref 70–99)
Potassium: 4.1 mEq/L (ref 3.7–5.3)
Sodium: 141 mEq/L (ref 137–147)

## 2013-08-21 LAB — VALPROIC ACID LEVEL: Valproic Acid Lvl: 25 ug/mL — ABNORMAL LOW (ref 50.0–100.0)

## 2013-08-21 NOTE — Progress Notes (Addendum)
[facility]                                                                                              Progress note     Patient Demographics  Chase Blackwell, is a 48 y.o. male  [id number]  [id number]  DOB - 01/04/1966  Admit date - 07/23/2013  Admitting Physician Merton Border, MD  Outpatient Primary MD for the patient is Default, Provider, MD  LOS - 29   Chief complaint   Respiratory failure   Encephalopathy with anoxic brain injury   Protein calorie malnutrition         Subjective:   [name] more alert awake but confused. Denies chest pain shortness of breath denies nausea vomiting or diarrhea. Wants to eat .  Objective:   Vital signs  Temperature  97.8  Heart rate  64  Respiratory rate  20  Blood pressure  120/64  Pulse ox  98%     Exam alert awake, communicating  [name].AT, pupils 5 mm bilaterally with good reaction to light  Supple Neck,No JVD, No cervical lymphadenopathy appriciated. Tracheostomy in place, capped Symmetrical Chest wall movement, decreased breath sounds bilaterally with scattered rhonchi RRR,No Gallops,Rubs or new Murmurs, No Parasternal Heave +ve B.Sounds, Abd Soft, Non tender, No organomegaly appriciated, No rebound - guarding or rigidity. PEG tube in place Right side rash noted in a dermatome under the right shoulder   I&Os  +2424    Data Review   CBC  Recent Labs Lab 08/20/13 0620 08/21/13 0611  WBC 5.7 6.1  HGB 12.7* 11.7*  HCT 39.0 36.8*  PLT 278 295  MCV 101.0* 100.3*  MCH 32.9 31.9  MCHC 32.6 31.8  RDW 15.3 15.6*    Chemistries   Recent Labs Lab 08/20/13 0620 08/21/13 0611  NA 143 141  K 4.2 4.1  CL 103 102  CO2 27 28  GLUCOSE 99 79  BUN 15 17  CREATININE 0.73 0.86  CALCIUM 9.4 9.2   ------------------------------------------------------------------------------------------------------------------ CrCl is  unknown because both a height and weight (above a minimum accepted value) are required for this calculation. ------------------------------------------------------------------------------------------------------------------ No results found for this basename: HGBA1C,  in the last 72 hours ------------------------------------------------------------------------------------------------------------------ No results found for this basename: CHOL, HDL, LDLCALC, TRIG, CHOLHDL, LDLDIRECT,  in the last 72 hours ------------------------------------------------------------------------------------------------------------------ No results found for this basename: TSH, T4TOTAL, FREET3, T3FREE, THYROIDAB,  in the last 72 hours ------------------------------------------------------------------------------------------------------------------ No results found for this basename: VITAMINB12, FOLATE, FERRITIN, TIBC, IRON, RETICCTPCT,  in the last 72 hours  Coagulation profile No results found for this basename: INR, PROTIME,  in the last 168 hours  No results found for this basename: DDIMER,  in the last 72 hours  Cardiac Enzymes No results found for this basename: CK, CKMB, TROPONINI, MYOGLOBIN,  in the last 168 hours ------------------------------------------------------------------------------------------------------------------ No components found with this basename: POCBNP,   Micro Results No results found for this or any previous visit (from the past 240 hour(s)).     Assessment & Plan    Respiratory failure continue with ATC 28%, off the ventilator tolerating capping Healthcare associated pneumonia  status post treatment History of asthma on neb treatments when necessary Coronary artery disease status post ST elevation MI and ventricular fibrillation with arrest and resulting encephalopathy Hypertension controlled Protein calorie malnutrition will consult IR for G-tube placement Anoxic brain  injury with encephalopathy status post EEG showing slowing; on fish oil and provigil, no improvement History of schizophrenia/agitation on Seroquel,and Haldol and Ativan when necessary Off restraints ,1:1 History of seizures on Depakote and Keppra Increased secretions on scopolamine Pseudomonas and Klebsiella tracheobronchitis treated Shingles Treated   Plan  Add provigil Will decannulate tomorrow if stable  Code Status: Full   DVT Prophylaxis  heparin   Merton Border M.D on 08/21/2013 at 11:49 AM

## 2013-08-22 NOTE — Progress Notes (Addendum)
[facility]                                                                                              Progress note     Patient Demographics  Chase Blackwell, is a 48 y.o. male  GYI:[id number]  [id number]  DOB - 05/11/1965  Admit date - 07/23/2013  Admitting Physician Merton Border, MD  Outpatient Primary MD for the patient is Default, Provider, MD  LOS - 64   Chief complaint   Respiratory failure   Encephalopathy with anoxic brain injury   Protein calorie malnutrition         Subjective:   Chase Blackwell more alert awake but confused. Denies chest pain shortness of breath denies nausea vomiting or diarrhea.  Objective:   Vital signs  Temperature  97.4 Heart rate  64  Respiratory rate  18  Blood pressure  93/59  Pulse ox  98%     Exam alert awake, communicating  [place].AT, pupils 5 mm bilaterally with good reaction to light  Supple Neck,No JVD, No cervical lymphadenopathy appriciated. Tracheostomy in place, capped Symmetrical Chest wall movement, decreased breath sounds bilaterally with scattered rhonchi RRR,No Gallops,Rubs or new Murmurs, No Parasternal Heave +ve B.Sounds, Abd Soft, Non tender, No organomegaly appriciated, No rebound - guarding or rigidity. PEG tube in place Right side rash noted in a dermatome under the right shoulder   I&Os  +2424    Data Review   CBC  Recent Labs Lab 08/20/13 0620 08/21/13 0611  WBC 5.7 6.1  HGB 12.7* 11.7*  HCT 39.0 36.8*  PLT 278 295  MCV 101.0* 100.3*  MCH 32.9 31.9  MCHC 32.6 31.8  RDW 15.3 15.6*    Chemistries   Recent Labs Lab 08/20/13 0620 08/21/13 0611  NA 143 141  K 4.2 4.1  CL 103 102  CO2 27 28  GLUCOSE 99 79  BUN 15 17  CREATININE 0.73 0.86  CALCIUM 9.4 9.2   ------------------------------------------------------------------------------------------------------------------ CrCl is unknown because both a  height and weight (above a minimum accepted value) are required for this calculation. ------------------------------------------------------------------------------------------------------------------ No results found for this basename: HGBA1C,  in the last 72 hours ------------------------------------------------------------------------------------------------------------------ No results found for this basename: CHOL, HDL, LDLCALC, TRIG, CHOLHDL, LDLDIRECT,  in the last 72 hours ------------------------------------------------------------------------------------------------------------------ No results found for this basename: TSH, T4TOTAL, FREET3, T3FREE, THYROIDAB,  in the last 72 hours ------------------------------------------------------------------------------------------------------------------ No results found for this basename: VITAMINB12, FOLATE, FERRITIN, TIBC, IRON, RETICCTPCT,  in the last 72 hours  Coagulation profile No results found for this basename: INR, PROTIME,  in the last 168 hours  No results found for this basename: DDIMER,  in the last 72 hours  Cardiac Enzymes No results found for this basename: CK, CKMB, TROPONINI, MYOGLOBIN,  in the last 168 hours ------------------------------------------------------------------------------------------------------------------ No components found with this basename: POCBNP,   Micro Results No results found for this or any previous visit (from the past 240 hour(s)).     Assessment & Plan    Respiratory failure continue with ATC 28%, off the ventilator tolerating capping Healthcare associated pneumonia status post treatment History of  asthma on neb treatments when necessary Coronary artery disease status post ST elevation MI and ventricular fibrillation with arrest and resulting encephalopathy Hypertension controlled Protein calorie malnutrition will consult status post G-tube placement Anoxic brain injury with  encephalopathy status post EEG showing slowing; on fish oil and provigil, improving History of schizophrenia/agitation on Seroquel,  and Haldol and Ativan when necessary off restraints but still one on one History of seizures on Depakote and Keppra Increased secretions off scopolamine Pseudomonas and Klebsiella tracheobronchitis treated Shingles Treated   Plan  Decannulated patient today Repeat 2-D echo Start ACE inhibitor in a.m. if his blood pressure can hold Decreased Lopressor to twice a day Decrease Depakote to twice a day Critical care time 34 minutes  Code Status: Full   DVT Prophylaxis  heparin   Merton Border M.D on 08/22/2013 at 12:25 PM

## 2013-08-23 DIAGNOSIS — I509 Heart failure, unspecified: Secondary | ICD-10-CM

## 2013-08-23 NOTE — Progress Notes (Signed)
*  PRELIMINARY RESULTS* Echocardiogram 2D Echocardiogram has been performed.  Leavy Cella 08/23/2013, 3:06 PM

## 2013-08-23 NOTE — Progress Notes (Signed)
[facility]                                                                                              Progress note     Patient Demographics  Chase Blackwell, is a 48 y.o. male  [id number]:[id number]  [id number]  DOB - April 07, 1965  Admit date - 07/23/2013  Admitting Physician Merton Border, MD  Outpatient Primary MD for the patient is Default, Provider, MD  LOS - 54   Chief complaint   Respiratory failure   Encephalopathy with anoxic brain injury   Protein calorie malnutrition         Subjective:   Chase Blackwell more alert awake but confused. Denies chest pain shortness of breath denies nausea vomiting or diarrhea.  Objective:   Vital signs  Temperature 97.3 Heart rate  63  Respiratory rate  18  Blood pressure  108/62 Pulse ox  98%     Exam alert awake, communicating  [place].AT, pupils 5 mm bilaterally with good reaction to light  Supple Neck,No JVD, No cervical lymphadenopathy appriciated. Tracheostomy in place, capped Symmetrical Chest wall movement, decreased breath sounds bilaterally with scattered rhonchi RRR,No Gallops,Rubs or new Murmurs, No Parasternal Heave +ve B.Sounds, Abd Soft, Non tender, No organomegaly appriciated, No rebound - guarding or rigidity. PEG tube in place Right side rash noted in a dermatome under the right shoulder   I&Os  unknown   Data Review   CBC  Recent Labs Lab 08/20/13 0620 08/21/13 0611  WBC 5.7 6.1  HGB 12.7* 11.7*  HCT 39.0 36.8*  PLT 278 295  MCV 101.0* 100.3*  MCH 32.9 31.9  MCHC 32.6 31.8  RDW 15.3 15.6*    Chemistries   Recent Labs Lab 08/20/13 0620 08/21/13 0611  NA 143 141  K 4.2 4.1  CL 103 102  CO2 27 28  GLUCOSE 99 79  BUN 15 17  CREATININE 0.73 0.86  CALCIUM 9.4 9.2   ------------------------------------------------------------------------------------------------------------------ CrCl is unknown because both a  height and weight (above a minimum accepted value) are required for this calculation. ------------------------------------------------------------------------------------------------------------------ No results found for this basename: HGBA1C,  in the last 72 hours ------------------------------------------------------------------------------------------------------------------ No results found for this basename: CHOL, HDL, LDLCALC, TRIG, CHOLHDL, LDLDIRECT,  in the last 72 hours ------------------------------------------------------------------------------------------------------------------ No results found for this basename: TSH, T4TOTAL, FREET3, T3FREE, THYROIDAB,  in the last 72 hours ------------------------------------------------------------------------------------------------------------------ No results found for this basename: VITAMINB12, FOLATE, FERRITIN, TIBC, IRON, RETICCTPCT,  in the last 72 hours  Coagulation profile No results found for this basename: INR, PROTIME,  in the last 168 hours  No results found for this basename: DDIMER,  in the last 72 hours  Cardiac Enzymes No results found for this basename: CK, CKMB, TROPONINI, MYOGLOBIN,  in the last 168 hours ------------------------------------------------------------------------------------------------------------------ No components found with this basename: POCBNP,   Micro Results No results found for this or any previous visit (from the past 240 hour(s)).     Assessment & Plan    Respiratory failure decannulated on 08/22/2013 Healthcare associated pneumonia status post treatment History of asthma on neb treatments when necessary Coronary artery disease  status post ST elevation MI and ventricular fibrillation with arrest and resulting encephalopathy Hypertension controlled Protein calorie malnutrition status post G. tube placement Anoxic brain injury with encephalopathy status post EEG showing slowing; on fish  oil and provigil, improved significantly History of schizophrenia/agitation on Seroquel, and Haldol and  off restraints History of seizures on Depakote and Keppra Increased secretions resolved Pseudomonas and Klebsiella tracheobronchitis treated Shingles Treated   Plan  Added Haldol yesterday Monitor physical therapy  Code Status: Full   DVT Prophylaxis  heparin   Merton Border M.D on 08/23/2013 at 12:50 PM

## 2013-08-24 NOTE — Progress Notes (Signed)
[facility]                                                                                              Progress note     Patient Demographics  Chase Blackwell, is a 48 y.o. male  [id number]:[id number]  [id number]  DOB - [birth year]  Admit date - 07/23/2013  Admitting Physician Merton Border, MD  Outpatient Primary MD for the patient is Default, Provider, MD  LOS - 60   Chief complaint   Respiratory failure   Encephalopathy with anoxic brain injury   Protein calorie malnutrition         Subjective:   Chase Blackwell has no new complaints  Objective:   Vital signs  Temperature 97.6 Heart rate  89  Respiratory rate  16  Blood pressure  98/60 Pulse ox  93%     Exam alert awake, communicating  [place].AT, pupils 5 mm bilaterally with good reaction to light  Supple Neck,No JVD, No cervical lymphadenopathy appriciated. Decannulated Symmetrical Chest wall movement, decreased breath sounds bilaterally with scattered rhonchi RRR,No Gallops,Rubs or new Murmurs, No Parasternal Heave +ve B.Sounds, Abd Soft, Non tender, No organomegaly appriciated, No rebound - guarding or rigidity. PEG tube in place Right side rash noted in a dermatome under the right shoulder, resolving   I&Os  unknown   Data Review   CBC  Recent Labs Lab 08/20/13 0620 08/21/13 0611  WBC 5.7 6.1  HGB 12.7* 11.7*  HCT 39.0 36.8*  PLT 278 295  MCV 101.0* 100.3*  MCH 32.9 31.9  MCHC 32.6 31.8  RDW 15.3 15.6*    Chemistries   Recent Labs Lab 08/20/13 0620 08/21/13 0611  NA 143 141  K 4.2 4.1  CL 103 102  CO2 27 28  GLUCOSE 99 79  BUN 15 17  CREATININE 0.73 0.86  CALCIUM 9.4 9.2   ------------------------------------------------------------------------------------------------------------------ CrCl is unknown because both a height and weight (above a minimum accepted value) are required for this  calculation. ------------------------------------------------------------------------------------------------------------------ No results found for this basename: HGBA1C,  in the last 72 hours ------------------------------------------------------------------------------------------------------------------ No results found for this basename: CHOL, HDL, LDLCALC, TRIG, CHOLHDL, LDLDIRECT,  in the last 72 hours ------------------------------------------------------------------------------------------------------------------ No results found for this basename: TSH, T4TOTAL, FREET3, T3FREE, THYROIDAB,  in the last 72 hours ------------------------------------------------------------------------------------------------------------------ No results found for this basename: VITAMINB12, FOLATE, FERRITIN, TIBC, IRON, RETICCTPCT,  in the last 72 hours  Coagulation profile No results found for this basename: INR, PROTIME,  in the last 168 hours  No results found for this basename: DDIMER,  in the last 72 hours  Cardiac Enzymes No results found for this basename: CK, CKMB, TROPONINI, MYOGLOBIN,  in the last 168 hours ------------------------------------------------------------------------------------------------------------------ No components found with this basename: POCBNP,   Micro Results No results found for this or any previous visit (from the past 240 hour(s)).     Assessment & Plan    Respiratory failure decannulated on 08/22/2013 Healthcare associated pneumonia status post treatment History of asthma on neb treatments when necessary Coronary artery disease status post ST elevation MI and ventricular fibrillation with arrest and resulting encephalopathy Hypertension  controlled Protein calorie malnutrition status post G. tube placement Anoxic brain injury with encephalopathy status post EEG showing slowing; on fish oil and provigil, improved significantly History of  schizophrenia/agitation on Seroquel, and Haldol and  off restraints History of seizures on Depakote and Keppra Increased secretions resolved Pseudomonas and Klebsiella tracheobronchitis treated Shingles Treated   Plan  Start Haldol Decanoate 100 mg IM q. monthly  Code Status: Full   DVT Prophylaxis  heparin   Merton Border M.D on 08/24/2013 at 12:16 PM

## 2013-08-25 NOTE — Progress Notes (Addendum)
[facility]                                                                                              Progress note     Patient Demographics  Chase Blackwell, is a 48 y.o. male  [id number]:[id number]  [id number]  DOB - March 01, 1965  Admit date - 07/23/2013  Admitting Physician Merton Border, MD  Outpatient Primary MD for the patient is Default, Provider, MD  LOS - 33   Chief complaint   Respiratory failure   Encephalopathy with anoxic brain injury   Protein calorie malnutrition         Subjective:   [name] has no new complaints  Objective:   Vital signs  Temperature 98 Heart rate  69  Respiratory rate  16  Blood pressure  100/60 Pulse ox  98%    Exam alert awake, communicating  [place].AT, pupils 5 mm bilaterally with good reaction to light  Supple Neck,No JVD, No cervical lymphadenopathy appriciated. Decannulated Symmetrical Chest wall movement, decreased breath sounds bilaterally with scattered rhonchi RRR,No Gallops,Rubs or new Murmurs, No Parasternal Heave +ve B.Sounds, Abd Soft, Non tender, No organomegaly appriciated, No rebound - guarding or rigidity. PEG tube in place Right side rash noted in a dermatome under the right shoulder, resolving   I&Os  unknown   Data Review   CBC  Recent Labs Lab 08/20/13 0620 08/21/13 0611  WBC 5.7 6.1  HGB 12.7* 11.7*  HCT 39.0 36.8*  PLT 278 295  MCV 101.0* 100.3*  MCH 32.9 31.9  MCHC 32.6 31.8  RDW 15.3 15.6*    Chemistries   Recent Labs Lab 08/20/13 0620 08/21/13 0611  NA 143 141  K 4.2 4.1  CL 103 102  CO2 27 28  GLUCOSE 99 79  BUN 15 17  CREATININE 0.73 0.86  CALCIUM 9.4 9.2   ------------------------------------------------------------------------------------------------------------------ CrCl is unknown because both a height and weight (above a minimum accepted value) are required for this  calculation. ------------------------------------------------------------------------------------------------------------------ No results found for this basename: HGBA1C,  in the last 72 hours ------------------------------------------------------------------------------------------------------------------ No results found for this basename: CHOL, HDL, LDLCALC, TRIG, CHOLHDL, LDLDIRECT,  in the last 72 hours ------------------------------------------------------------------------------------------------------------------ No results found for this basename: TSH, T4TOTAL, FREET3, T3FREE, THYROIDAB,  in the last 72 hours ------------------------------------------------------------------------------------------------------------------ No results found for this basename: VITAMINB12, FOLATE, FERRITIN, TIBC, IRON, RETICCTPCT,  in the last 72 hours  Coagulation profile No results found for this basename: INR, PROTIME,  in the last 168 hours  No results found for this basename: DDIMER,  in the last 72 hours  Cardiac Enzymes No results found for this basename: CK, CKMB, TROPONINI, MYOGLOBIN,  in the last 168 hours ------------------------------------------------------------------------------------------------------------------ No components found with this basename: POCBNP,   Micro Results No results found for this or any previous visit (from the past 240 hour(s)).     Assessment & Plan    Respiratory failure decannulated on 08/22/2013 Healthcare associated pneumonia status post treatment History of asthma on neb treatments when necessary Coronary artery disease status post ST elevation MI and ventricular fibrillation with arrest and resulting encephalopathy Hypertension controlled  Protein calorie malnutrition status post G. tube placement tolerating by mouth dysphagia 1 and nectar thickness Anoxic brain injury with encephalopathy status post EEG showing slowing; on fish oil and provigil,  improved significantly History of schizophrenia/agitation on Seroquel, and Haldol (long-acting) and  off restraints. Still needs one on one History of seizures on Depakote and Keppra Increased secretions resolved Pseudomonas and Klebsiella tracheobronchitis treated Shingles Treated  Generalized weakness complicated by anoxic brain injury; ambulating very well on the hallways but needs constant supervision Diastolic congestive heart failure with systolic congestive heart failure that normalized on the last echo which showed a normal ejection fraction  Plan  Continue same treatment Plan discharge if appropriate environment is found  Code Status: Full   DVT Prophylaxis  heparin   Merton Border M.D on 08/25/2013 at 12:55 PM

## 2013-08-26 NOTE — Progress Notes (Signed)
[facility]                                                                                              Progress note     Patient Demographics  Chase Blackwell, is a 48 y.o. male  ELY:[id number]  KKO:[id number]  DOB - [birth year]  Admit date - 07/23/2013  Admitting Physician Merton Border, MD  Outpatient Primary MD for the patient is Default, Provider, MD  LOS - 38   Chief complaint   Respiratory failure   Encephalopathy with anoxic brain injury   Protein calorie malnutrition         Subjective:   Chase Blackwell has no new complaints  Objective:   Vital signs  Temperature 97.5 Heart rate  80  Respiratory rate  18  Blood pressure  1/66 Pulse ox  97%    Exam alert awake, communicating  [place].AT, pupils 5 mm bilaterally with good reaction to light  Supple Neck,No JVD, No cervical lymphadenopathy appriciated. Decannulated Symmetrical Chest wall movement, decreased breath sounds bilaterally with scattered rhonchi RRR,No Gallops,Rubs or new Murmurs, No Parasternal Heave +ve B.Sounds, Abd Soft, Non tender, No organomegaly appriciated, No rebound - guarding or rigidity. PEG tube in place Right side rash noted in a dermatome under the right shoulder, resolving   I&Os  unknown   Data Review   CBC  Recent Labs Lab 08/20/13 0620 08/21/13 0611  WBC 5.7 6.1  HGB 12.7* 11.7*  HCT 39.0 36.8*  PLT 278 295  MCV 101.0* 100.3*  MCH 32.9 31.9  MCHC 32.6 31.8  RDW 15.3 15.6*    Chemistries   Recent Labs Lab 08/20/13 0620 08/21/13 0611  NA 143 141  K 4.2 4.1  CL 103 102  CO2 27 28  GLUCOSE 99 79  BUN 15 17  CREATININE 0.73 0.86  CALCIUM 9.4 9.2   ------------------------------------------------------------------------------------------------------------------ CrCl is unknown because both a height and weight (above a minimum accepted value) are required for this  calculation. ------------------------------------------------------------------------------------------------------------------ No results found for this basename: HGBA1C,  in the last 72 hours ------------------------------------------------------------------------------------------------------------------ No results found for this basename: CHOL, HDL, LDLCALC, TRIG, CHOLHDL, LDLDIRECT,  in the last 72 hours ------------------------------------------------------------------------------------------------------------------ No results found for this basename: TSH, T4TOTAL, FREET3, T3FREE, THYROIDAB,  in the last 72 hours ------------------------------------------------------------------------------------------------------------------ No results found for this basename: VITAMINB12, FOLATE, FERRITIN, TIBC, IRON, RETICCTPCT,  in the last 72 hours  Coagulation profile No results found for this basename: INR, PROTIME,  in the last 168 hours  No results found for this basename: DDIMER,  in the last 72 hours  Cardiac Enzymes No results found for this basename: CK, CKMB, TROPONINI, MYOGLOBIN,  in the last 168 hours ------------------------------------------------------------------------------------------------------------------ No components found with this basename: POCBNP,   Micro Results No results found for this or any previous visit (from the past 240 hour(s)).     Assessment & Plan    Respiratory failure decannulated on 08/22/2013 Healthcare associated pneumonia status post treatment History of asthma on neb treatments when necessary Coronary artery disease status post ST elevation MI and ventricular fibrillation with arrest and resulting encephalopathy Hypertension controlled  Protein calorie malnutrition status post G. tube placement tolerating by mouth dysphagia 1 and nectar thickness Anoxic brain injury with encephalopathy status post EEG showing slowing; on fish oil and provigil,  improved significantly History of schizophrenia/agitation on Seroquel, and Haldol (long-acting) and  off restraints. 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

## 2013-08-30 ENCOUNTER — Other Ambulatory Visit: Payer: Self-pay | Admitting: *Deleted

## 2013-08-30 MED ORDER — MODAFINIL 100 MG PO TABS: ORAL_TABLET | ORAL | Status: DC

## 2013-08-30 MED ORDER — ZOLPIDEM TARTRATE 5 MG PO TABS: 5.0000 mg | ORAL_TABLET | Freq: Every evening | ORAL | Status: DC | PRN

## 2013-08-30 MED ORDER — OXYCODONE HCL 5 MG PO TABS: ORAL_TABLET | ORAL | Status: DC

## 2013-08-30 NOTE — Telephone Encounter (Signed)
[facility] 

## 2013-08-31 ENCOUNTER — Other Ambulatory Visit (HOSPITAL_COMMUNITY): Payer: Self-pay | Admitting: Internal Medicine

## 2013-08-31 DIAGNOSIS — R1310 Dysphagia, unspecified: Secondary | ICD-10-CM

## 2013-09-01 ENCOUNTER — Non-Acute Institutional Stay (SKILLED_NURSING_FACILITY): Payer: Medicare Other | Admitting: Internal Medicine

## 2013-09-01 ENCOUNTER — Encounter: Payer: Self-pay | Admitting: Internal Medicine

## 2013-09-01 DIAGNOSIS — F1910 Other psychoactive substance abuse, uncomplicated: Secondary | ICD-10-CM

## 2013-09-01 DIAGNOSIS — F200 Paranoid schizophrenia: Secondary | ICD-10-CM

## 2013-09-01 DIAGNOSIS — G931 Anoxic brain damage, not elsewhere classified: Secondary | ICD-10-CM | POA: Insufficient documentation

## 2013-09-01 DIAGNOSIS — I2119 ST elevation (STEMI) myocardial infarction involving other coronary artery of inferior wall: Secondary | ICD-10-CM

## 2013-09-01 DIAGNOSIS — J209 Acute bronchitis, unspecified: Secondary | ICD-10-CM

## 2013-09-01 DIAGNOSIS — J9601 Acute respiratory failure with hypoxia: Secondary | ICD-10-CM

## 2013-09-01 DIAGNOSIS — R569 Unspecified convulsions: Secondary | ICD-10-CM | POA: Insufficient documentation

## 2013-09-01 DIAGNOSIS — F1210 Cannabis abuse, uncomplicated: Secondary | ICD-10-CM

## 2013-09-01 DIAGNOSIS — Z931 Gastrostomy status: Secondary | ICD-10-CM | POA: Insufficient documentation

## 2013-09-01 DIAGNOSIS — E7800 Pure hypercholesterolemia, unspecified: Secondary | ICD-10-CM

## 2013-09-01 DIAGNOSIS — F1010 Alcohol abuse, uncomplicated: Secondary | ICD-10-CM

## 2013-09-01 DIAGNOSIS — I10 Essential (primary) hypertension: Secondary | ICD-10-CM

## 2013-09-01 DIAGNOSIS — J9600 Acute respiratory failure, unspecified whether with hypoxia or hypercapnia: Secondary | ICD-10-CM

## 2013-09-01 NOTE — Assessment & Plan Note (Signed)
Presentation was unresponsive, stented RCA

## 2013-09-01 NOTE — Assessment & Plan Note (Signed)
Pt on folate and thiamine

## 2013-09-01 NOTE — Assessment & Plan Note (Signed)
Noted  

## 2013-09-01 NOTE — Assessment & Plan Note (Signed)
With agitation;reported to be "off restraints but needs one-on-one at this time" and a "monitored environment" and then is sent to a SNF

## 2013-09-01 NOTE — Assessment & Plan Note (Addendum)
2/2 to above;  And medicine list includes keppra but in body of note says pt was taken off of it? - will continue at this time

## 2013-09-01 NOTE — Assessment & Plan Note (Signed)
noted 

## 2013-09-01 NOTE — Assessment & Plan Note (Signed)
2/2 to near death by MI

## 2013-09-01 NOTE — Assessment & Plan Note (Signed)
treated

## 2013-09-01 NOTE — Assessment & Plan Note (Signed)
2/2 and tx to specialty where he ws weaned and decanulated

## 2013-09-01 NOTE — Assessment & Plan Note (Signed)
3 med regimen

## 2013-09-01 NOTE — Progress Notes (Signed)
MRN: [id number] Name: Chase Blackwell  Sex: male Age: 48 y.o. DOB: [birth year]  [place] #: Helene Kelp Facility/Room: 215A Level Of Care: SNF Provider: Inocencio Homes D Emergency Contacts: Extended Emergency Contact Information Primary Emergency Contact: Witham,Shirley Address: [address] [address] [place] of [place] Phone: [phone] Relation: Mother Secondary Emergency Contact: Salomon Mast [place] of [place] Phone: [phone] Relation: Sister  Code Status: FULL  Allergies: Hctz; Hydroxyzine; Sulfonamide derivatives; and Cetirizine & related  Chief Complaint  Patient presents with  . New Admit To SNF    HPI: Patient is 48 y.o. male who is poly substance abuser, schizophrenic admitted to hospital initially 7/22 with unresponsive and MI and s/p multiple complications including anoxic brain injury, s/p trach, currently with feeding tube who is now admitted to SNF.  Past Medical History  Diagnosis Date  . Hypertension   . Asthma   . GERD (gastroesophageal reflux disease)   . Chronic pain   . Schizophrenia   . Alcohol abuse     History reviewed. No pertinent past surgical history.    Medication List       This list is accurate as of: 09/01/13 11:05 PM.  Always use your most recent med list.               aspirin 81 MG chewable tablet  Chew 1 tablet (81 mg total) by mouth daily.     atorvastatin 80 MG tablet  Commonly known as:  LIPITOR  Take 1 tablet (80 mg total) by mouth daily at 6 PM.     bacitracin 500 UNIT/GM ointment  Apply 1 application topically 2 (two) times daily.     busPIRone 5 MG tablet  Commonly known as:  BUSPAR  Take 5 mg by mouth 2 (two) times daily.     CENTAMIN Liqd  Take 5 mLs by mouth daily.     clopidogrel 75 MG tablet  Commonly known as:  PLAVIX  Take 75 mg by mouth daily.     divalproex 125 MG capsule  Commonly known as:  DEPAKOTE SPRINKLE  Take 250 mg by mouth 2 (two) times  daily.     famotidine 20 MG tablet  Commonly known as:  PEPCID  Take 20 mg by mouth daily.     folic acid 1 MG tablet  Commonly known as:  FOLVITE  Place 1 tablet (1 mg total) into feeding tube daily.     furosemide 10 MG/ML solution  Commonly known as:  LASIX  Place 4 mLs (40 mg total) into feeding tube daily.     haloperidol decanoate 50 MG/ML injection  Commonly known as:  HALDOL DECANOATE  Inject 100 mg into the muscle every 28 (twenty-eight) days.     heparin 5000 UNIT/ML injection  Inject 1 mL (5,000 Units total) into the skin every 8 (eight) hours.     hydrALAZINE 25 MG tablet  Commonly known as:  APRESOLINE  Take 10 mg by mouth 2 (two) times daily.     labetalol 5 MG/ML injection  Commonly known as:  NORMODYNE,TRANDATE  Inject 2-4 mLs (10-20 mg total) into the vein every 6 (six) hours as needed (For syst BP >160).     levETIRAcetam 1000 MG tablet  Commonly known as:  KEPPRA  Take 1,000 mg by mouth 2 (two) times daily.     lisinopril 2.5 MG tablet  Commonly known as:  PRINIVIL,ZESTRIL  Take 2.5 mg by  mouth daily.     Melatonin 5 MG Tabs  Take 1 tablet by mouth at bedtime.     metoprolol tartrate 25 MG tablet  Commonly known as:  LOPRESSOR  Take 12.5 mg by mouth 2 (two) times daily.     modafinil 100 MG tablet  Commonly known as:  PROVIGIL  Take one tablet by mouth every morning     oxyCODONE 5 MG immediate release tablet  Commonly known as:  ROXICODONE  Take one tablet by mouth twice daily for pain     QUEtiapine 50 MG tablet  Commonly known as:  SEROQUEL  Take 50 mg by mouth at bedtime.     SM FISH OIL 1000 MG Caps  Take 6,000 mg by mouth daily.     thiamine 100 MG tablet  Place 1 tablet (100 mg total) into feeding tube daily.     ziprasidone 20 MG capsule  Commonly known as:  GEODON  Take 20 mg by mouth daily.     zolpidem 5 MG tablet  Commonly known as:  AMBIEN  Take 1 tablet (5 mg total) by mouth at bedtime as needed for sleep.         Meds ordered this encounter  Medications  . divalproex (DEPAKOTE SPRINKLE) 125 MG capsule    Sig: Take 250 mg by mouth 2 (two) times daily.  . hydrALAZINE (APRESOLINE) 25 MG tablet    Sig: Take 10 mg by mouth 2 (two) times daily.  . metoprolol tartrate (LOPRESSOR) 25 MG tablet    Sig: Take 12.5 mg by mouth 2 (two) times daily.  . busPIRone (BUSPAR) 5 MG tablet    Sig: Take 5 mg by mouth 2 (two) times daily.  . bacitracin 500 UNIT/GM ointment    Sig: Apply 1 application topically 2 (two) times daily.  . clopidogrel (PLAVIX) 75 MG tablet    Sig: Take 75 mg by mouth daily.  . famotidine (PEPCID) 20 MG tablet    Sig: Take 20 mg by mouth daily.  . Omega-3 Fatty Acids (SM FISH OIL) 1000 MG CAPS    Sig: Take 6,000 mg by mouth daily.  . haloperidol decanoate (HALDOL DECANOATE) 50 MG/ML injection    Sig: Inject 100 mg into the muscle every 28 (twenty-eight) days.  Marland Kitchen levETIRAcetam (KEPPRA) 1000 MG tablet    Sig: Take 1,000 mg by mouth 2 (two) times daily.  Marland Kitchen lisinopril (PRINIVIL,ZESTRIL) 2.5 MG tablet    Sig: Take 2.5 mg by mouth daily.  . Melatonin 5 MG TABS    Sig: Take 1 tablet by mouth at bedtime.  Marland Kitchen QUEtiapine (SEROQUEL) 50 MG tablet    Sig: Take 50 mg by mouth at bedtime.  . ziprasidone (GEODON) 20 MG capsule    Sig: Take 20 mg by mouth daily.  . Multiple Vitamins-Minerals (CENTAMIN) LIQD    Sig: Take 5 mLs by mouth daily.    Immunization History  Administered Date(s) Administered  . Pneumococcal Polysaccharide-23 07/15/2013    History  Substance Use Topics  . Smoking status: Current Every Day Smoker -- 1.00 packs/day    Types: Cigarettes  . Smokeless tobacco: Not on file  . Alcohol Use: 1.2 oz/week    2 Cans of beer per week     Comment: 1 400 oz per week    Family history is noncontributory    Review of Systems  DATA OBTAINED: from patient; c/o wants a regular diet GENERAL: Feels well no fevers, fatigue, appetite changes SKIN: No itching,  rash or  wounds EYES: No eye pain, redness, discharge EARS: No earache, tinnitus, change in hearing NOSE: No congestion, drainage or bleeding  MOUTH/THROAT: No mouth or tooth pain, RESPIRATORY: No cough, wheezing, SOB CARDIAC: No chest pain, palpitations, lower extremity edema  GI: No abdominal pain, No N/V/D or constipation, No heartburn or reflux  GU: No dysuria, frequency or urgency, or incontinence  MUSCULOSKELETAL: No unrelieved bone/joint pain NEUROLOGIC: No headache, dizziness or focal weakness PSYCHIATRIC: No overt anxiety or sadness. Sleeps well. No behavior issue.   Filed Vitals:   09/01/13 2233  BP: 124/78  Pulse: 84  Temp: 97.8 F (36.6 C)  Resp: 20    Physical Exam  GENERAL APPEARANCE: Alert, conversant. Appropriately groomed. No acute distress.  SKIN: No diaphoresis rash HEAD: Normocephalic, atraumatic  EYES: Conjunctiva/lids clear. Pupils round, reactive. EOMs intact.  EARS: External exam WNL, canals clear. Hearing grossly normal.  NOSE: No deformity or discharge.  MOUTH/THROAT: Lips w/o lesions  RESPIRATORY: Breathing is even, unlabored. Lung sounds are clear   CARDIOVASCULAR: Heart RRR no murmurs, rubs or gallops. No peripheral edema.  GASTROINTESTINAL: Abdomen is soft, non-tender, not distended w/ normal bowel sounds; PEG tube GENITOURINARY: Bladder non tender, not distended  MUSCULOSKELETAL: No abnormal joints or musculature NEUROLOGIC:  Cranial nerves 2-12 grossly intact. Moves all extremities no tremor. PSYCHIATRIC: simple,odd affect, no behavioral issues  Patient Active Problem List   Diagnosis Date Noted  . Anoxic brain injury 09/01/2013  . Seizures 09/01/2013  . Acute tracheobronchitis 09/01/2013  . PEG (percutaneous endoscopic gastrostomy) status 09/01/2013  . Altered mental status 07/09/2013  . Iatrogenic pneumothorax 07/07/2013  . Cardiac arrest 07/06/2013  . ST elevation myocardial infarction (STEMI) of inferior wall, initial episode of care  07/06/2013  . Acute respiratory failure 07/06/2013  . HYPERCHOLESTEROLEMIA 06/05/2008  . PARANOID SCHIZOPHRENIA, CHRONIC 06/05/2008  . ALCOHOL ABUSE 06/05/2008  . CANNABIS ABUSE 06/05/2008  . SUBSTANCE ABUSE, MULTIPLE 06/05/2008  . DEPRESSION 06/05/2008  . HYPERTENSION 06/05/2008  . BRONCHITIS 06/05/2008  . RECTAL BLEEDING 06/05/2008    CBC    Component Value Date/Time   WBC 6.1 08/21/2013 0611   RBC 3.67* 08/21/2013 0611   HGB 11.7* 08/21/2013 0611   HCT 36.8* 08/21/2013 0611   PLT 295 08/21/2013 0611   MCV 100.3* 08/21/2013 0611   LYMPHSABS 1.5 07/24/2013 0500   MONOABS 1.9* 07/24/2013 0500   EOSABS 0.3 07/24/2013 0500   BASOSABS 0.1 07/24/2013 0500    CMP     Component Value Date/Time   NA 141 08/21/2013 0611   K 4.1 08/21/2013 0611   CL 102 08/21/2013 0611   CO2 28 08/21/2013 0611   GLUCOSE 79 08/21/2013 0611   BUN 17 08/21/2013 0611   CREATININE 0.86 08/21/2013 0611   CALCIUM 9.2 08/21/2013 0611   PROT 7.8 07/24/2013 0500   ALBUMIN 3.0* 07/24/2013 0500   AST 56* 07/24/2013 0500   ALT 76* 07/24/2013 0500   ALKPHOS 177* 07/24/2013 0500   BILITOT 0.7 07/24/2013 0500   GFRNONAA >90 08/21/2013 0611   GFRAA >90 08/21/2013 0611    Assessment and Plan  ST elevation myocardial infarction (STEMI) of inferior wall, initial episode of care Presentation was unresponsive, stented RCA  Anoxic brain injury 2/2 to near death by MI  Seizures 2/2 to above;  And medicine list includes keppra but in body of note says pt was taken off of it? - will continue at this time  Acute respiratory failure 2/2 and tx to specialty where he ws weaned  and decanulated  PARANOID SCHIZOPHRENIA, CHRONIC With agitation;reported to be "off restraints but needs one-on-one at this time" and a "monitored environment" and then is sent to a SNF  HYPERTENSION 3 med regimen  Acute tracheobronchitis treated  ALCOHOL ABUSE Pt on folate and thiamine  CANNABIS ABUSE Noted  SUBSTANCE ABUSE,  MULTIPLE noted  HYPERCHOLESTEROLEMIA Continue lipitor 80 mg    Hennie Duos, MD

## 2013-09-01 NOTE — Assessment & Plan Note (Signed)
--  Continue lipitor 80mg  ?

## 2013-09-04 ENCOUNTER — Ambulatory Visit (HOSPITAL_COMMUNITY)
Admission: RE | Admit: 2013-09-04 | Discharge: 2013-09-04 | Disposition: A | Discharge status: Home / Self Care | Payer: PRIVATE HEALTH INSURANCE | Source: Ambulatory Visit | Attending: Internal Medicine | Admitting: Internal Medicine

## 2013-09-04 ENCOUNTER — Ambulatory Visit (HOSPITAL_COMMUNITY)
Admission: RE | Admit: 2013-09-04 | Discharge: 2013-09-04 | Disposition: A | Discharge status: Home / Self Care | Payer: Medicare Other | Source: Ambulatory Visit | Attending: Internal Medicine | Admitting: Internal Medicine

## 2013-09-04 DIAGNOSIS — R1310 Dysphagia, unspecified: Secondary | ICD-10-CM

## 2013-09-04 DIAGNOSIS — J45909 Unspecified asthma, uncomplicated: Secondary | ICD-10-CM | POA: Insufficient documentation

## 2013-09-04 DIAGNOSIS — I10 Essential (primary) hypertension: Secondary | ICD-10-CM | POA: Diagnosis not present

## 2013-09-04 DIAGNOSIS — Z8659 Personal history of other mental and behavioral disorders: Secondary | ICD-10-CM | POA: Diagnosis not present

## 2013-09-04 DIAGNOSIS — R1313 Dysphagia, pharyngeal phase: Secondary | ICD-10-CM | POA: Insufficient documentation

## 2013-09-04 DIAGNOSIS — R1311 Dysphagia, oral phase: Secondary | ICD-10-CM | POA: Insufficient documentation

## 2013-09-04 DIAGNOSIS — F209 Schizophrenia, unspecified: Secondary | ICD-10-CM | POA: Insufficient documentation

## 2013-09-04 DIAGNOSIS — F1010 Alcohol abuse, uncomplicated: Secondary | ICD-10-CM | POA: Insufficient documentation

## 2013-09-04 DIAGNOSIS — K219 Gastro-esophageal reflux disease without esophagitis: Secondary | ICD-10-CM | POA: Insufficient documentation

## 2013-09-04 DIAGNOSIS — G8929 Other chronic pain: Secondary | ICD-10-CM | POA: Insufficient documentation

## 2013-09-04 NOTE — Procedures (Signed)
Objective Swallowing Evaluation: Modified Barium Swallowing Study  Patient Details  Name: Chase Blackwell MRN: [id number] Date of Birth: 05-13-1965  Today's Date: 09/04/2013 Time: 9326-7124 SLP Time Calculation (min): 39 min  Past Medical History:  Past Medical History  Diagnosis Date  . Hypertension   . Asthma   . GERD (gastroesophageal reflux disease)   . Chronic pain   . Schizophrenia   . Alcohol abuse    Past Surgical History: No past surgical history on file. HPI:  48 yo male who is poly substance abuser, schizophrenic admitted to hospital initially 07/06/13  with  MI and respiratory failure (ETT 7/3-7/17; trach 7/17- 8/19), anoxic brain injury, PEG 07/06/2013.  Transferred to [facility] 7/20-8/29.  Currently on a dysphagia 1 diet with nectar-thick liquids. Pt with resting tremor of tongue and extremities.  Stoma not fully healed; voice weak; resonance hypernasal.       Assessment / Plan / Recommendation Clinical Impression  Dysphagia Diagnosis: Mild-mod  oral phase dysphagia; Mild-mod pharyngeal phase dysphagia   Clinical impression: Pt presents with a mild-mod oropharyngeal dysphagia marked by: 1) decreased oral manipulation of POs with premature loss into pharynx,  reduced velopharyngeal valving; 2) reduced mobility of hyolaryngeal complex, leading to consistent, trace aspiration of thin liquids. There is good pharyngeal clearance.  A weak cough response was elicited with aspiration of larger bolus size.  Chin tuck was not beneficial.   Per primary SLP, pt has been eating/drinking materials outside of recommended pureed/nectar-thick liquid consistencies.  During testing, he was given mechanical soft solids and thin liquids, which he self-fed with rapid rate and large bolus sizes - aspiration of thin liquids remained present, but minimal.    Recommend discussion with MD weighing benefits/burdens of continuing a restricted diet vs liberalizing current diet; consider pt's ability to comply given  significant cognitive deficits (short-term recall, inhibition, reasoning) and his potential to tolerate small amounts of aspiration.  Discussed results with pt and primary SLP, who will take results back to team/MD.     Treatment Recommendation  Defer treatment plan to SLP at (Comment) (SNF)    Diet Recommendation  (see impression) defer to primary MD and SLP  Medication Administration: Crushed with puree                 SLP Swallow Goals     General HPI: 48 yo male who is poly substance abuser, schizophrenic admitted to hospital initially 07/06/13  with  MI and respiratory failure (ETT 7/3-7/17; trach 7/17- 8/19), anoxic brain injury, PEG 07/26/2013.  Transferred to [facility] 7/20-8/29.  Currently on a dysphagia 1 diet with nectar-thick liquids.  Type of Study: Modified Barium Swallowing Study Reason for Referral: Objectively evaluate swallowing function Diet Prior to this Study: Dysphagia 2 (chopped);Nectar-thick liquids Temperature Spikes Noted: No Respiratory Status: Room air History of Recent Intubation: Yes Length of Intubations (days):  (see HPI) Date extubated: 07/20/13 Behavior/Cognition: Alert;Distractible (impulsive) Oral Cavity - Dentition: Missing dentition;Poor condition Oral Motor / Sensory Function:  (impaired bilateral velar elevation; mild right CN VII asymmetry) Self-Feeding Abilities: Able to feed self Patient Positioning: Upright in chair Baseline Vocal Quality: Breathy (stoma still open s/p decannulation) Volitional Cough: Weak Volitional Swallow: Able to elicit Anatomy: Within functional limits Pharyngeal Secretions: Not observed secondary MBS    Reason for Referral Objectively evaluate swallowing function   Oral Phase Oral Preparation/Oral Phase Oral Phase: Impaired Oral - Nectar Oral - Nectar Cup: Weak lingual manipulation;Incomplete tongue to palate contact;Decreased velopharyngeal closure Oral - Thin Oral -  Thin Cup: Weak lingual manipulation;Incomplete  tongue to palate contact;Decreased velopharyngeal closure Oral - Solids Oral - Puree: Weak lingual manipulation;Incomplete tongue to palate contact;Decreased velopharyngeal closure Oral - Mechanical Soft: Weak lingual manipulation;Incomplete tongue to palate contact;Decreased velopharyngeal closure   Pharyngeal Phase Pharyngeal Phase Pharyngeal Phase: Impaired Pharyngeal - Nectar Pharyngeal - Nectar Cup: Premature spillage to pyriform sinuses;Reduced epiglottic inversion;Reduced anterior laryngeal mobility;Reduced laryngeal elevation Pharyngeal - Thin Pharyngeal - Thin Cup: Premature spillage to pyriform sinuses;Penetration/Aspiration during swallow;Trace aspiration Penetration/Aspiration details (thin cup): Material enters airway, passes BELOW cords without attempt by patient to eject out (silent aspiration);Material enters airway, passes BELOW cords and not ejected out despite cough attempt by patient Pharyngeal - Solids Pharyngeal - Puree: Reduced epiglottic inversion;Reduced anterior laryngeal mobility;Reduced laryngeal elevation;Premature spillage to valleculae;Pharyngeal residue - pyriform sinuses Pharyngeal - Mechanical Soft: Reduced epiglottic inversion;Reduced anterior laryngeal mobility;Reduced laryngeal elevation;Premature spillage to valleculae;Pharyngeal residue - pyriform sinuses  Cervical Esophageal Phase   Alee Gressman L. Tivis Ringer, [place] CCC/SLP Pager [phone]               Juan Quam Laurice 09/04/2013, 12:16 PM

## 2013-09-27 ENCOUNTER — Non-Acute Institutional Stay (SKILLED_NURSING_FACILITY): Payer: Medicare Other | Admitting: Internal Medicine

## 2013-09-27 ENCOUNTER — Encounter: Payer: Self-pay | Admitting: Internal Medicine

## 2013-09-27 DIAGNOSIS — I2119 ST elevation (STEMI) myocardial infarction involving other coronary artery of inferior wall: Secondary | ICD-10-CM

## 2013-09-27 DIAGNOSIS — J209 Acute bronchitis, unspecified: Secondary | ICD-10-CM

## 2013-09-27 DIAGNOSIS — I10 Essential (primary) hypertension: Secondary | ICD-10-CM

## 2013-09-27 DIAGNOSIS — R569 Unspecified convulsions: Secondary | ICD-10-CM

## 2013-09-27 DIAGNOSIS — F1010 Alcohol abuse, uncomplicated: Secondary | ICD-10-CM

## 2013-09-27 DIAGNOSIS — J9601 Acute respiratory failure with hypoxia: Secondary | ICD-10-CM

## 2013-09-27 DIAGNOSIS — G931 Anoxic brain damage, not elsewhere classified: Secondary | ICD-10-CM

## 2013-09-27 DIAGNOSIS — F1210 Cannabis abuse, uncomplicated: Secondary | ICD-10-CM

## 2013-09-27 DIAGNOSIS — E7800 Pure hypercholesterolemia, unspecified: Secondary | ICD-10-CM

## 2013-09-27 DIAGNOSIS — J9600 Acute respiratory failure, unspecified whether with hypoxia or hypercapnia: Secondary | ICD-10-CM

## 2013-09-27 DIAGNOSIS — F200 Paranoid schizophrenia: Secondary | ICD-10-CM

## 2013-09-27 NOTE — Progress Notes (Signed)
MRN: [id number] Name: Chase Blackwell  Sex: male Age: 48 y.o. DOB: December 12, 1965  [place] #: Helene Kelp Facility/Room: 215A Level Of Care: SNF Provider: Inocencio Homes D Emergency Contacts: Extended Emergency Contact Information Primary Emergency Contact: Nardozzi,Shirley Address: [address] [address] Johnnette L[place] of [place] Phone: [phone] Relation: Mother Secondary Emergency Contact: Salomon Mast [place] of [place] Phone: [phone] Relation: Sister  Code Status: FULL  Allergies: Hctz; Hydroxyzine; Sulfonamide derivatives; and Cetirizine & related  Chief Complaint  Patient presents with  . Discharge Note    HPI: Patient is 48 y.o. male  Is admitted to SNF after MI and anoxic brain injury  Past Medical History  Diagnosis Date  . Hypertension   . Asthma   . GERD (gastroesophageal reflux disease)   . Chronic pain   . Schizophrenia   . Alcohol abuse     History reviewed. No pertinent past surgical history.    Medication List       This list is accurate as of: 09/27/13  5:38 PM.  Always use your most recent med list.               aspirin 81 MG chewable tablet  Chew 1 tablet (81 mg total) by mouth daily.     atorvastatin 80 MG tablet  Commonly known as:  LIPITOR  Take 1 tablet (80 mg total) by mouth daily at 6 PM.     busPIRone 5 MG tablet  Commonly known as:  BUSPAR  Take 5 mg by mouth 2 (two) times daily.     CENTAMIN Liqd  Take 5 mLs by mouth daily.     clopidogrel 75 MG tablet  Commonly known as:  PLAVIX  Take 75 mg by mouth daily.     divalproex 500 MG DR tablet  Commonly known as:  DEPAKOTE  Take 500 mg by mouth 2 (two) times daily.     famotidine 20 MG tablet  Commonly known as:  PEPCID  Take 20 mg by mouth daily.     folic acid 1 MG tablet  Commonly known as:  FOLVITE  Place 1 tablet (1 mg total) into feeding tube daily.     furosemide 10 MG/ML solution  Commonly known as:  LASIX  Place 4  mLs (40 mg total) into feeding tube daily.     haloperidol decanoate 50 MG/ML injection  Commonly known as:  HALDOL DECANOATE  Inject 100 mg into the muscle every 28 (twenty-eight) days.     hydrALAZINE 25 MG tablet  Commonly known as:  APRESOLINE  Take 10 mg by mouth 2 (two) times daily.     levETIRAcetam 1000 MG tablet  Commonly known as:  KEPPRA  Take 1,000 mg by mouth 2 (two) times daily.     lisinopril 2.5 MG tablet  Commonly known as:  PRINIVIL,ZESTRIL  Take 2.5 mg by mouth daily.     Melatonin 5 MG Tabs  Take 1 tablet by mouth at bedtime.     metoprolol tartrate 25 MG tablet  Commonly known as:  LOPRESSOR  Take 12.5 mg by mouth 2 (two) times daily.     modafinil 100 MG tablet  Commonly known as:  PROVIGIL  Take one tablet by mouth every morning     oxyCODONE 5 MG immediate release tablet  Commonly known as:  ROXICODONE  Take one tablet by mouth twice daily for pain     QUEtiapine 50 MG  tablet  Commonly known as:  SEROQUEL  Take 50 mg by mouth at bedtime.     SM FISH OIL 1000 MG Caps  Take 6,000 mg by mouth daily.     thiamine 100 MG tablet  Place 1 tablet (100 mg total) into feeding tube daily.     zolpidem 5 MG tablet  Commonly known as:  AMBIEN  Take 1 tablet (5 mg total) by mouth at bedtime as needed for sleep.        Meds ordered this encounter  Medications  . divalproex (DEPAKOTE) 500 MG DR tablet    Sig: Take 500 mg by mouth 2 (two) times daily.    Immunization History  Administered Date(s) Administered  . Pneumococcal Polysaccharide-23 07/15/2013    History  Substance Use Topics  . Smoking status: Current Every Day Smoker -- 1.00 packs/day    Types: Cigarettes  . Smokeless tobacco: Not on file  . Alcohol Use: 1.2 oz/week    2 Cans of beer per week     Comment: 1 400 oz per week    Filed Vitals:   09/27/13 1535  BP: 109/68  Pulse: 82  Temp: 97.1 F (36.2 C)  Resp: 19    Physical Exam  GENERAL APPEARANCE: Alert, conversant.  Appropriately groomed. No acute distress.  HEENT: Unremarkable. RESPIRATORY: Breathing is even, unlabored. Lung sounds are clear   CARDIOVASCULAR: Heart RRR no murmurs, rubs or gallops. No peripheral edema.  GASTROINTESTINAL: Abdomen is soft, non-tender, not distended w/ normal bowel sounds;PEG tube in place.  NEUROLOGIC: Cranial nerves 2-12 grossly intact. Moves all extremities no tremor.  Patient Active Problem List   Diagnosis Date Noted  . Anoxic brain injury 09/01/2013  . Seizures 09/01/2013  . Acute tracheobronchitis 09/01/2013  . PEG (percutaneous endoscopic gastrostomy) status 09/01/2013  . Altered mental status 07/09/2013  . Iatrogenic pneumothorax 07/07/2013  . Cardiac arrest 07/06/2013  . ST elevation myocardial infarction (STEMI) of inferior wall, initial episode of care 07/06/2013  . Acute respiratory failure 07/06/2013  . HYPERCHOLESTEROLEMIA 06/05/2008  . PARANOID SCHIZOPHRENIA, CHRONIC 06/05/2008  . ALCOHOL ABUSE 06/05/2008  . CANNABIS ABUSE 06/05/2008  . SUBSTANCE ABUSE, MULTIPLE 06/05/2008  . DEPRESSION 06/05/2008  . HYPERTENSION 06/05/2008  . BRONCHITIS 06/05/2008  . RECTAL BLEEDING 06/05/2008    CBC    Component Value Date/Time   WBC 6.1 08/21/2013 0611   RBC 3.67* 08/21/2013 0611   HGB 11.7* 08/21/2013 0611   HCT 36.8* 08/21/2013 0611   PLT 295 08/21/2013 0611   MCV 100.3* 08/21/2013 0611   LYMPHSABS 1.5 07/24/2013 0500   MONOABS 1.9* 07/24/2013 0500   EOSABS 0.3 07/24/2013 0500   BASOSABS 0.1 07/24/2013 0500    CMP     Component Value Date/Time   NA 141 08/21/2013 0611   K 4.1 08/21/2013 0611   CL 102 08/21/2013 0611   CO2 28 08/21/2013 0611   GLUCOSE 79 08/21/2013 0611   BUN 17 08/21/2013 0611   CREATININE 0.86 08/21/2013 0611   CALCIUM 9.2 08/21/2013 0611   PROT 7.8 07/24/2013 0500   ALBUMIN 3.0* 07/24/2013 0500   AST 56* 07/24/2013 0500   ALT 76* 07/24/2013 0500   ALKPHOS 177* 07/24/2013 0500   BILITOT 0.7 07/24/2013 0500   GFRNONAA >90 08/21/2013 0611    GFRAA >90 08/21/2013 0611    Assessment and Plan  Pt is d/c to ALF. He still has a PEG tube that will be removed tomorrow prior to departure. He will be followed by HH/OT/PT. He  will need almost constant supervision.  Hennie Duos, MD

## 2013-09-28 ENCOUNTER — Other Ambulatory Visit: Payer: Self-pay | Admitting: Internal Medicine

## 2013-09-28 DIAGNOSIS — R6251 Failure to thrive (child): Secondary | ICD-10-CM

## 2013-09-28 NOTE — Progress Notes (Signed)
Late entry: SLP MBS from 09/04/13  09/04/13 1100  SLP Visit Information  SLP Received On 09/04/13  General Information  HPI 48 yo male who is poly substance abuser, schizophrenic admitted to hospital initially 07/06/13  with  MI and respiratory failure (ETT 7/3-7/17; trach 7/17- 8/19), anoxic brain injury, PEG 07/08/2013.  Transferred to [facility] 7/20-8/29.  Currently on a dysphagia 1 diet with nectar-thick liquids.   Type of Study MBSS  Diet Prior to this Study Dysphagia 2 (chopped);Nectar-thick liquids  Temperature Spikes Noted No  Respiratory Status Room air  History of Recent Intubation Yes  Length of Intubations (days) (see HPI)  Date extubated 07/20/13  Behavior/Cognition Alert;Distractible (impulsive)  Oral Cavity - Dentition Missing dentition;Poor condition  Self-Feeding Abilities Able to feed self  Patient Positioning Upright in chair  Baseline Vocal Quality Breathy (stoma still open s/p decannulation)  Volitional Cough Weak  Volitional Swallow Able to elicit  Swallow Evaluation Recommendations  Diet Recommendations (see impression)  Medication Administration Crushed with puree  Treatment Plan  Treatment Plan Recommendations Defer treatment plan to SLP at (Comment) (SNF)  Individuals Consulted  Report Sent to  Facility (Comment)  SLP Time Calculation  SLP Start Time 1110  SLP Stop Time 1149  SLP Time Calculation (min) 39 min  SLP G-Codes **NOT FOR INPATIENT CLASS**  Functional Assessment Tool Used clinical judgement  Functional Limitations Swallowing  Swallow Current Status (U2725) CJ  Swallow Goal Status (D6644) CJ  Swallow Discharge Status (I3474) CJ  SLP Evaluations  $ SLP Speech Visit 1 Procedure  SLP Evaluations  $MBS Swallow Outpatient 1 Procedure

## 2013-10-01 ENCOUNTER — Ambulatory Visit (HOSPITAL_COMMUNITY)
Admission: RE | Admit: 2013-10-01 | Discharge: 2013-10-01 | Disposition: A | Discharge status: Home / Self Care | Payer: Medicare Other | Source: Ambulatory Visit | Attending: Internal Medicine | Admitting: Internal Medicine

## 2013-10-01 ENCOUNTER — Other Ambulatory Visit: Payer: Self-pay | Admitting: *Deleted

## 2013-10-01 DIAGNOSIS — R6251 Failure to thrive (child): Secondary | ICD-10-CM

## 2013-10-01 DIAGNOSIS — Z431 Encounter for attention to gastrostomy: Secondary | ICD-10-CM | POA: Insufficient documentation

## 2013-10-01 MED ORDER — OXYCODONE HCL 5 MG PO TABS: ORAL_TABLET | ORAL | Status: DC

## 2013-10-01 MED ORDER — LIDOCAINE VISCOUS 2 % MT SOLN
OROMUCOSAL | Status: AC
  Filled 2013-10-01: qty 15

## 2013-10-01 NOTE — Procedures (Signed)
Interventional Radiology Procedure Note  Procedure: Bedside G-tube pull.  No longer requiring tube.  Complications: No immediate Recommendations:   - Routine wound care   Signed,  Dulcy Fanny. Earleen Newport, DO

## 2013-10-01 NOTE — Telephone Encounter (Signed)
[facility] 

## 2013-10-11 ENCOUNTER — Emergency Department (HOSPITAL_COMMUNITY)
Admission: EM | Admit: 2013-10-11 | Discharge: 2013-10-11 | Disposition: A | Discharge status: Home / Self Care | Payer: Medicare Other | Attending: Emergency Medicine | Admitting: Emergency Medicine

## 2013-10-11 ENCOUNTER — Emergency Department (HOSPITAL_COMMUNITY): Payer: Medicare Other

## 2013-10-11 ENCOUNTER — Encounter (HOSPITAL_COMMUNITY): Payer: Self-pay | Admitting: Emergency Medicine

## 2013-10-11 DIAGNOSIS — F10230 Alcohol dependence with withdrawal, uncomplicated: Secondary | ICD-10-CM

## 2013-10-11 DIAGNOSIS — Z7902 Long term (current) use of antithrombotics/antiplatelets: Secondary | ICD-10-CM | POA: Diagnosis not present

## 2013-10-11 DIAGNOSIS — R569 Unspecified convulsions: Secondary | ICD-10-CM | POA: Diagnosis present

## 2013-10-11 DIAGNOSIS — F209 Schizophrenia, unspecified: Secondary | ICD-10-CM | POA: Diagnosis not present

## 2013-10-11 DIAGNOSIS — Z720 Tobacco use: Secondary | ICD-10-CM | POA: Insufficient documentation

## 2013-10-11 DIAGNOSIS — G40909 Epilepsy, unspecified, not intractable, without status epilepticus: Secondary | ICD-10-CM | POA: Insufficient documentation

## 2013-10-11 DIAGNOSIS — Z7982 Long term (current) use of aspirin: Secondary | ICD-10-CM | POA: Diagnosis not present

## 2013-10-11 DIAGNOSIS — Z79899 Other long term (current) drug therapy: Secondary | ICD-10-CM | POA: Diagnosis not present

## 2013-10-11 DIAGNOSIS — G8929 Other chronic pain: Secondary | ICD-10-CM | POA: Diagnosis not present

## 2013-10-11 DIAGNOSIS — J45909 Unspecified asthma, uncomplicated: Secondary | ICD-10-CM | POA: Diagnosis not present

## 2013-10-11 DIAGNOSIS — I10 Essential (primary) hypertension: Secondary | ICD-10-CM | POA: Diagnosis not present

## 2013-10-11 DIAGNOSIS — K219 Gastro-esophageal reflux disease without esophagitis: Secondary | ICD-10-CM | POA: Insufficient documentation

## 2013-10-11 DIAGNOSIS — F10188 Alcohol abuse with other alcohol-induced disorder: Secondary | ICD-10-CM | POA: Diagnosis not present

## 2013-10-11 LAB — I-STAT TROPONIN, ED: TROPONIN I, POC: 0.01 ng/mL (ref 0.00–0.08)

## 2013-10-11 LAB — BASIC METABOLIC PANEL
ANION GAP: 14 (ref 5–15)
BUN: 17 mg/dL (ref 6–23)
CHLORIDE: 102 meq/L (ref 96–112)
CO2: 24 meq/L (ref 19–32)
Calcium: 9.6 mg/dL (ref 8.4–10.5)
Creatinine, Ser: 0.61 mg/dL (ref 0.50–1.35)
GFR calc Af Amer: >90 mL/min (ref >90)
GFR calc non Af Amer: >90 mL/min (ref >90)
Glucose, Bld: 125 mg/dL — ABNORMAL HIGH (ref 70–99)
Potassium: 4.2 mEq/L (ref 3.7–5.3)
Sodium: 140 mEq/L (ref 137–147)

## 2013-10-11 LAB — URINALYSIS, ROUTINE W REFLEX MICROSCOPIC
Bilirubin Urine: NEGATIVE
GLUCOSE, UA: NEGATIVE mg/dL
Hgb urine dipstick: NEGATIVE
Ketones, ur: NEGATIVE mg/dL
LEUKOCYTES UA: NEGATIVE
Nitrite: NEGATIVE
PROTEIN: NEGATIVE mg/dL
Specific Gravity, Urine: 1.018 (ref 1.005–1.030)
UROBILINOGEN UA: 0.2 mg/dL (ref 0.0–1.0)
pH: 7 (ref 5.0–8.0)

## 2013-10-11 LAB — CBC
HEMATOCRIT: 41.3 % (ref 39.0–52.0)
Hemoglobin: 14 g/dL (ref 13.0–17.0)
MCH: 32.2 pg (ref 26.0–34.0)
MCHC: 33.9 g/dL (ref 30.0–36.0)
MCV: 94.9 fL (ref 78.0–100.0)
PLATELETS: 231 10*3/uL (ref 150–400)
RBC: 4.35 MIL/uL (ref 4.22–5.81)
RDW: 13.5 % (ref 11.5–15.5)
WBC: 8.5 10*3/uL (ref 4.0–10.5)

## 2013-10-11 LAB — VALPROIC ACID LEVEL: Valproic Acid Lvl: <10 ug/mL — ABNORMAL LOW (ref 50.0–100.0)

## 2013-10-11 MED ORDER — DIVALPROEX SODIUM 500 MG PO DR TAB
500.0000 mg | DELAYED_RELEASE_TABLET | Freq: Once | ORAL | Status: AC
  Administered 2013-10-11: 500 mg via ORAL
  Filled 2013-10-11: qty 1

## 2013-10-11 MED ORDER — LORAZEPAM 2 MG/ML IJ SOLN
INTRAMUSCULAR | Status: AC
  Administered 2013-10-11: 2 mg via INTRAVENOUS
  Filled 2013-10-11: qty 1

## 2013-10-11 MED ORDER — SODIUM CHLORIDE 0.9 % IV BOLUS (SEPSIS)
1000.0000 mL | Freq: Once | INTRAVENOUS | Status: AC
  Administered 2013-10-11: 1000 mL via INTRAVENOUS

## 2013-10-11 MED ORDER — LORAZEPAM 2 MG/ML IJ SOLN
2.0000 mg | Freq: Once | INTRAMUSCULAR | Status: AC
  Administered 2013-10-11: 2 mg via INTRAVENOUS

## 2013-10-11 NOTE — ED Notes (Signed)
[facility] arrived.  

## 2013-10-11 NOTE — Progress Notes (Signed)
  CARE MANAGEMENT ED NOTE 10/11/2013  Patient:  Chase Blackwell, Chase Blackwell   Account Number:  [id number]  Date Initiated:  10/11/2013  Documentation initiated by:  Jackelyn Poling  Subjective/Objective Assessment:   48 yr old medicare/medicaid [place] access pt friend heard a loud noise and went to investigate finding pt on the floor shaking all over  Pt has abrasion on his right shoulder  No other injuries to note  Pt has altered mental status     Subjective/Objective Assessment Detail:   no pcp listed initially  CM found for [facility] access pt is assigned to see [facility]  EPIC records indicates pt was d/c from [facility] to select specialty then to [facility] Dr Pietro Cassis notes for 09/27/13 states pt to d/c to alf     Action/Plan:   EPIC updated spoke with pt who did not know of pcp States he has not seen one   Action/Plan Detail:   Anticipated DC Date:       Status Recommendation to Physician:   Result of Recommendation:        Choice offered to / List presented to:            Status of service:    ED Comments:   ED Comments Detail:

## 2013-10-11 NOTE — ED Provider Notes (Signed)
CSN: [id number]     Arrival date & time 10/11/13  9798 History   First MD Initiated Contact with Patient 10/11/13 [phone]     Chief Complaint  Patient presents with  . Seizures     (Consider location/radiation/quality/duration/timing/severity/associated sxs/prior Treatment) Patient is a 48 y.o. male presenting with seizures. The history is provided by the patient.  Seizures Seizure activity on arrival: no   Seizure type:  Grand mal Preceding symptoms: no euphoria, no headache, no hyperventilation and no panic   Initial focality:  None Episode characteristics: generalized shaking   Postictal symptoms: confusion   Severity:  Moderate Duration: unknown. Timing:  Once Progression:  Worsening Context: previous head injury   Context: not sleeping less and not fever     Past Medical History  Diagnosis Date  . Hypertension   . Asthma   . GERD (gastroesophageal reflux disease)   . Chronic pain   . Schizophrenia   . Alcohol abuse    History reviewed. No pertinent past surgical history. Family History  Problem Relation Age of Onset  . Malignant hyperthermia Mother   . Cirrhosis Father   . Alcohol abuse Father    History  Substance Use Topics  . Smoking status: Current Every Day Smoker -- 1.00 packs/day    Types: Cigarettes  . Smokeless tobacco: Not on file  . Alcohol Use: 1.2 oz/week    2 Cans of beer per week     Comment: 1 400 oz per week    Review of Systems  Unable to perform ROS: Mental status change  Neurological: Positive for seizures.      Allergies  Hctz; Hydroxyzine; Sulfonamide derivatives; and Cetirizine & related  Home Medications   Prior to Admission medications   Medication Sig Start Date End Date Taking? Authorizing Provider  aspirin 81 MG chewable tablet Chew 1 tablet (81 mg total) by mouth daily. 07/23/13   Donita Brooks, NP  atorvastatin (LIPITOR) 80 MG tablet Take 1 tablet (80 mg total) by mouth daily at 6 PM. 07/23/13   Donita Brooks, NP   busPIRone (BUSPAR) 5 MG tablet Take 5 mg by mouth 2 (two) times daily.    Historical Provider, MD  clopidogrel (PLAVIX) 75 MG tablet Take 75 mg by mouth daily.    Historical Provider, MD  divalproex (DEPAKOTE) 500 MG DR tablet Take 500 mg by mouth 2 (two) times daily.    Historical Provider, MD  famotidine (PEPCID) 20 MG tablet Take 20 mg by mouth daily.    Historical Provider, MD  folic acid (FOLVITE) 1 MG tablet Place 1 tablet (1 mg total) into feeding tube daily. 07/23/13   Donita Brooks, NP  furosemide (LASIX) 10 MG/ML solution Place 4 mLs (40 mg total) into feeding tube daily. 07/23/13   Donita Brooks, NP  haloperidol decanoate (HALDOL DECANOATE) 50 MG/ML injection Inject 100 mg into the muscle every 28 (twenty-eight) days.    Historical Provider, MD  hydrALAZINE (APRESOLINE) 25 MG tablet Take 10 mg by mouth 2 (two) times daily. 07/23/13   Donita Brooks, NP  levETIRAcetam (KEPPRA) 1000 MG tablet Take 1,000 mg by mouth 2 (two) times daily.    Historical Provider, MD  lisinopril (PRINIVIL,ZESTRIL) 2.5 MG tablet Take 2.5 mg by mouth daily.    Historical Provider, MD  Melatonin 5 MG TABS Take 1 tablet by mouth at bedtime.    Historical Provider, MD  metoprolol tartrate (LOPRESSOR) 25 MG tablet Take 12.5 mg by mouth 2 (two)  times daily. 07/23/13   Donita Brooks, NP  modafinil (PROVIGIL) 100 MG tablet Take one tablet by mouth every morning 08/30/13   Tiffany L Reed, DO  Multiple Vitamins-Minerals (CENTAMIN) LIQD Take 5 mLs by mouth daily.    Historical Provider, MD  Omega-3 Fatty Acids (SM FISH OIL) 1000 MG CAPS Take 6,000 mg by mouth daily.    Historical Provider, MD  oxyCODONE (ROXICODONE) 5 MG immediate release tablet Take one tablet by mouth twice daily for pain 10/01/13   Tiffany L Reed, DO  QUEtiapine (SEROQUEL) 50 MG tablet Take 50 mg by mouth at bedtime.    Historical Provider, MD  thiamine 100 MG tablet Place 1 tablet (100 mg total) into feeding tube daily. 07/23/13   Donita Brooks, NP   zolpidem (AMBIEN) 5 MG tablet Take 1 tablet (5 mg total) by mouth at bedtime as needed for sleep. 08/30/13   Tiffany L Reed, DO   BP 165/82  Pulse 113  Temp(Src) 98.3 F (36.8 C) (Oral)  Resp 16  SpO2 99% Physical Exam  Nursing note and vitals reviewed. Constitutional: He appears well-developed and well-nourished. No distress.  HENT:  Head: Normocephalic and atraumatic.  Mouth/Throat: Oropharynx is clear and moist. No oropharyngeal exudate.  Eyes: EOM are normal. Pupils are equal, round, and reactive to light.  Neck: Normal range of motion. Neck supple.  Cardiovascular: Normal rate and regular rhythm.  Exam reveals no friction rub.   No murmur heard. Pulmonary/Chest: Effort normal and breath sounds normal. No respiratory distress. He has no wheezes. He has no rales.  Abdominal: Soft. He exhibits no distension. There is no tenderness. There is no rebound.  Musculoskeletal: Normal range of motion. He exhibits no edema.  Neurological: He is alert. No cranial nerve deficit. He exhibits normal muscle tone. Coordination normal.  Skin: Skin is warm. No rash noted. He is not diaphoretic.    ED Course  Procedures (including critical care time) Labs Review Labs Reviewed  CBC  BASIC METABOLIC PANEL  VALPROIC ACID LEVEL  URINALYSIS, ROUTINE W REFLEX MICROSCOPIC  I-STAT [place], ED    Imaging Review Dg Chest 2 View  10/11/2013   CLINICAL DATA:  Initial evaluation forshaking and mental status change  EXAM: CHEST  2 VIEW  COMPARISON:  07/2013  FINDINGS: Heart size and vascular pattern are normal. Uncoiling of the aorta. Mild discoid atelectasis in the lower third of both lungs. No pleural effusion or pneumothorax. Bony thorax intact.  IMPRESSION: Mild atelectasis otherwise negative   Electronically Signed   By: Skipper Cliche M.D.   On: 10/11/2013 07:53   Ct Head Wo Contrast  10/11/2013   CLINICAL DATA:  Seizure today. Altered mental status. Initial evaluation .  EXAM: CT HEAD WITHOUT  CONTRAST  TECHNIQUE: Contiguous axial images were obtained from the base of the skull through the vertex without intravenous contrast.  COMPARISON:  MRI 07/11/2013.  CT 07/08/2013.  FINDINGS: No intra-axial or extra-axial pathologic fluid or blood collections identified. No mass lesion. No hydrocephalus. Small amount of fluid noted in the left mastoid air cells. No acute bony abnormality.  IMPRESSION: No acute abnormality.   Electronically Signed   By: Marcello Moores  Register   On: 10/11/2013 07:56     EKG Interpretation   Date/Time:  Thursday October 11 2013 07:59:30 EDT Ventricular Rate:  96 PR Interval:  156 QRS Duration: 84 QT Interval:  349 QTC Calculation: 441 R Axis:   83 Text Interpretation:  Sinus rhythm Similar to prior Confirmed  by Mingo Amber   MD, [place] [phone]) on 10/11/2013 9:26:53 AM      MDM   Final diagnoses:  Seizure  Alcohol withdrawal seizure, uncomplicated    26R with complicated hx of prolonged ICU s/p arrest with anoxic brain injury, seizues presents with seizure. Unknown what exactly happened, found shaking. Alert to me, follows commands. Per notes, baseline mental status now oriented to self vs intermittent agitation. Here tachycardic, follows commands. Will check labs, Head CT, EKG. Labs and imaging ok. Patient had subtherapeutic depakote, will give depakote here. Patient had another seizure. Ativan given to prevent further seizures. Family finally gotten in touch with, state patient lives alone, drank large quantity of alcohol yesterday. This could be an alcohol withdrawal seizure, or alcohol could've lowered his seizure threshold since he's had an anoxic brain injury. Unclear at this time, patient cannot provide any history.   Patient is stable for discharge, no reason to admit patient, subtherapeutic on his AEDs, instructed to continue taking his AEDs.   Evelina Bucy, MD 10/12/13 [phone]

## 2013-10-11 NOTE — ED Notes (Signed)
[facility] called for transport.  

## 2013-10-11 NOTE — ED Notes (Signed)
Pt currently on 2 lpm [place] and oxygen saturation unremarkable. Pt laying on right side and moans. Pt moving freely. Pt able to state name at present time.

## 2013-10-11 NOTE — ED Notes (Signed)
Pt sitting up at present time. Pt repositioned in bed for comfort. Pt able to state name, reports pain to right arm. Upon assessment abrasion noted to right shoulder. Pt states is at hospital.

## 2013-10-11 NOTE — ED Notes (Addendum)
Pt opens eyes spontaneously when name called. Pt denies not respond verbally. Pt has received 2 mg of ativan for seizure previously noted.

## 2013-10-11 NOTE — ED Notes (Signed)
Apolonio Schneiders NT called staff into room. Pt actively seizing. Pt placed on non re breather and post initial at present time. MD at bedside.

## 2013-10-11 NOTE — ED Notes (Signed)
Per EMS friend heard a loud noise and went to investigate finding pt on the floor shaking all over  Pt has abrasion on his right shoulder  No other injuries to note  Pt has altered mental status  Pt placed in KED for transport  [place]ine lock placed by EMS

## 2013-10-11 NOTE — ED Notes (Signed)
Pt continue to sit up and attempt to get out off bed. Sitter at bedside at present time. Pt encouraged to void several times without success of urine sample. Pt has had 4 cups of ice water.

## 2013-10-11 NOTE — ED Notes (Signed)
Called [facility] to inquire about ETA, per dispatcher, truck was dispatched at [id number], and they should be here soon.

## 2013-10-11 NOTE — ED Notes (Signed)
Patient is easily aroused by name. Patient is oriented to self. Patient does not answer questions. Patient denies pain at this time.

## 2013-10-11 NOTE — Discharge Instructions (Signed)
Alcohol Withdrawal °Anytime drug use is interfering with normal living activities it has become abuse. This includes problems with family and friends. Psychological dependence has developed when your mind tells you that the drug is needed. This is usually followed by physical dependence when a continuing increase of drugs are required to get the same feeling or "high." This is known as addiction or chemical dependency. A person's risk is much higher if there is a history of chemical dependency in the family. °Mild Withdrawal Following Stopping Alcohol, When Addiction or Chemical Dependency Has Developed °When a person has developed tolerance to alcohol, any sudden stopping of alcohol can cause uncomfortable physical symptoms. Most of the time these are mild and consist of tremors in the hands and increases in heart rate, breathing, and temperature. Sometimes these symptoms are associated with anxiety, panic attacks, and bad dreams. There may also be stomach upset. Normal sleep patterns are often interrupted with periods of inability to sleep (insomnia). This may last for 6 months. Because of this discomfort, many people choose to continue drinking to get rid of this discomfort and to try to feel normal. °Severe Withdrawal with Decreased or No Alcohol Intake, When Addiction or Chemical Dependency Has Developed °About five percent of alcoholics will develop signs of severe withdrawal when they stop using alcohol. One sign of this is development of generalized seizures (convulsions). Other signs of this are severe agitation and confusion. This may be associated with believing in things which are not real or seeing things which are not really there (delusions and hallucinations). Vitamin deficiencies are usually present if alcohol intake has been long-term. Treatment for this most often requires hospitalization and close observation. °Addiction can only be helped by stopping use of all chemicals. This is hard but may  save your life. With continual alcohol use, possible outcomes are usually loss of self respect and esteem, violence, and death. °Addiction cannot be cured but it can be stopped. This often requires outside help and the care of professionals. Treatment centers are listed in the yellow pages under Cocaine, Narcotics, and Alcoholics Anonymous. Most hospitals and clinics can refer you to a specialized care center. °It is not necessary for you to go through the uncomfortable symptoms of withdrawal. Your caregiver can provide you with medicines that will help you through this difficult period. Try to avoid situations, friends, or drugs that made it possible for you to keep using alcohol in the past. Learn how to say no. °It takes a long period of time to overcome addictions to all drugs, including alcohol. There may be many times when you feel as though you want a drink. After getting rid of the physical addiction and withdrawal, you will have a lessening of the craving which tells you that you need alcohol to feel normal. Call your caregiver if more support is needed. Learn who to talk to in your family and among your friends so that during these periods you can receive outside help. Alcoholics Anonymous (AA) has helped many people over the years. To get further help, contact AA or call your caregiver, counselor, or clergyperson. Al-Anon and Alateen are support groups for friends and family members of an alcoholic. The people who love and care for an alcoholic often need help, too. For information about these organizations, check your phone directory or call a local alcoholism treatment center.  °SEEK IMMEDIATE MEDICAL CARE IF:  °· You have a seizure. °· You have a fever. °· You experience uncontrolled vomiting or you   vomit up blood. This may be bright red or look like black coffee grounds.  You have blood in the stool. This may be bright red or appear as a black, tarry, bad-smelling stool.  You become lightheaded or  faint. Do not drive if you feel this way. Have someone else drive you or call 086 for help.  You become more agitated or confused.  You develop uncontrolled anxiety.  You begin to see things that are not really there (hallucinate). Your caregiver has determined that you completely understand your medical condition, and that your mental state is back to normal. You understand that you have been treated for alcohol withdrawal, have agreed not to drink any alcohol for a minimum of 1 day, will not operate a car or other machinery for 24 hours, and have had an opportunity to ask any questions about your condition. Document Released: 09/30/2004 Document Revised: 03/15/2011 Document Reviewed: 08/09/2007 [facility] Patient Information 2015 [place], [place]. This information is not intended to replace advice given to you by your health care provider. Make sure you discuss any questions you have with your health care provider.  Seizure, Adult A seizure is abnormal electrical activity in the brain. Seizures usually last from 30 seconds to 2 minutes. There are various types of seizures. Before a seizure, you may have a warning sensation (aura) that a seizure is about to occur. An aura may include the following symptoms:   Fear or anxiety.  Nausea.  Feeling like the room is spinning (vertigo).  Vision changes, such as seeing flashing lights or spots. Common symptoms during a seizure include:  A change in attention or behavior (altered mental status).  Convulsions with rhythmic jerking movements.  Drooling.  Rapid eye movements.  Grunting.  Loss of bladder and bowel control.  Bitter taste in the mouth.  Tongue biting. After a seizure, you may feel confused and sleepy. You may also have an injury resulting from convulsions during the seizure. HOME CARE INSTRUCTIONS   If you are given medicines, take them exactly as prescribed by your health care provider.  Keep all follow-up appointments as  directed by your health care provider.  Do not swim or drive or engage in risky activity during which a seizure could cause further injury to you or others until your health care provider says it is OK.  Get adequate rest.  Teach friends and family what to do if you have a seizure. They should:  Lay you on the ground to prevent a fall.  Put a cushion under your head.  Loosen any tight clothing around your neck.  Turn you on your side. If vomiting occurs, this helps keep your airway clear.  Stay with you until you recover.  Know whether or not you need emergency care. SEEK IMMEDIATE MEDICAL CARE IF:  The seizure lasts longer than 5 minutes.  The seizure is severe or you do not wake up immediately after the seizure.  You have an altered mental status after the seizure.  You are having more frequent or worsening seizures. Someone should drive you to the emergency department or call local emergency services (911 in [place] MAKE SURE YOU:  Understand these instructions.  Will watch your condition.  Will get help right away if you are not doing well or get worse. Document Released: 12/19/1999 Document Revised: 10/11/2012 Document Reviewed: 08/02/2012 [facility] Patient Information 2015 [place], [place]. This information is not intended to replace advice given to you by your health care provider. Make sure you  discuss any questions you have with your health care provider.  Seizure, Adult A seizure is abnormal electrical activity in the brain. Seizures usually last from 30 seconds to 2 minutes. There are various types of seizures. Before a seizure, you may have a warning sensation (aura) that a seizure is about to occur. An aura may include the following symptoms:   Fear or anxiety.  Nausea.  Feeling like the room is spinning (vertigo).  Vision changes, such as seeing flashing lights or spots. Common symptoms during a seizure include:  A change in attention or behavior  (altered mental status).  Convulsions with rhythmic jerking movements.  Drooling.  Rapid eye movements.  Grunting.  Loss of bladder and bowel control.  Bitter taste in the mouth.  Tongue biting. After a seizure, you may feel confused and sleepy. You may also have an injury resulting from convulsions during the seizure. HOME CARE INSTRUCTIONS   If you are given medicines, take them exactly as prescribed by your health care provider.  Keep all follow-up appointments as directed by your health care provider.  Do not swim or drive or engage in risky activity during which a seizure could cause further injury to you or others until your health care provider says it is OK.  Get adequate rest.  Teach friends and family what to do if you have a seizure. They should:  Lay you on the ground to prevent a fall.  Put a cushion under your head.  Loosen any tight clothing around your neck.  Turn you on your side. If vomiting occurs, this helps keep your airway clear.  Stay with you until you recover.  Know whether or not you need emergency care. SEEK IMMEDIATE MEDICAL CARE IF:  The seizure lasts longer than 5 minutes.  The seizure is severe or you do not wake up immediately after the seizure.  You have an altered mental status after the seizure.  You are having more frequent or worsening seizures. Someone should drive you to the emergency department or call local emergency services (911 in [place] MAKE SURE YOU:  Understand these instructions.  Will watch your condition.  Will get help right away if you are not doing well or get worse. Document Released: 12/19/1999 Document Revised: 10/11/2012 Document Reviewed: 08/02/2012 [facility] Patient Information 2015 [place], [place]. This information is not intended to replace advice given to you by your health care provider. Make sure you discuss any questions you have with your health care provider.

## 2013-10-11 NOTE — ED Notes (Signed)
Called pt's mother and brother multiple times to let them know pt is ready to be discharged, no answer. Will keep trying.

## 2013-10-13 DIAGNOSIS — Z48812 Encounter for surgical aftercare following surgery on the circulatory system: Secondary | ICD-10-CM

## 2013-10-13 DIAGNOSIS — I2510 Atherosclerotic heart disease of native coronary artery without angina pectoris: Secondary | ICD-10-CM

## 2013-10-13 DIAGNOSIS — I509 Heart failure, unspecified: Secondary | ICD-10-CM

## 2013-10-13 DIAGNOSIS — G931 Anoxic brain damage, not elsewhere classified: Secondary | ICD-10-CM

## 2013-11-13 ENCOUNTER — Encounter (HOSPITAL_COMMUNITY): Payer: Self-pay | Admitting: *Deleted

## 2013-11-13 ENCOUNTER — Emergency Department (HOSPITAL_COMMUNITY)
Admission: EM | Admit: 2013-11-13 | Discharge: 2013-11-13 | Disposition: A | Discharge status: Home / Self Care | Payer: Medicare Other | Attending: Emergency Medicine | Admitting: Emergency Medicine

## 2013-11-13 ENCOUNTER — Emergency Department (HOSPITAL_COMMUNITY): Payer: Medicare Other

## 2013-11-13 DIAGNOSIS — J45909 Unspecified asthma, uncomplicated: Secondary | ICD-10-CM | POA: Insufficient documentation

## 2013-11-13 DIAGNOSIS — I10 Essential (primary) hypertension: Secondary | ICD-10-CM | POA: Diagnosis not present

## 2013-11-13 DIAGNOSIS — Z79899 Other long term (current) drug therapy: Secondary | ICD-10-CM | POA: Insufficient documentation

## 2013-11-13 DIAGNOSIS — Z7902 Long term (current) use of antithrombotics/antiplatelets: Secondary | ICD-10-CM | POA: Insufficient documentation

## 2013-11-13 DIAGNOSIS — Z720 Tobacco use: Secondary | ICD-10-CM | POA: Insufficient documentation

## 2013-11-13 DIAGNOSIS — Z7982 Long term (current) use of aspirin: Secondary | ICD-10-CM | POA: Diagnosis not present

## 2013-11-13 DIAGNOSIS — G40909 Epilepsy, unspecified, not intractable, without status epilepticus: Secondary | ICD-10-CM | POA: Insufficient documentation

## 2013-11-13 DIAGNOSIS — Z8719 Personal history of other diseases of the digestive system: Secondary | ICD-10-CM | POA: Insufficient documentation

## 2013-11-13 DIAGNOSIS — G8929 Other chronic pain: Secondary | ICD-10-CM | POA: Diagnosis not present

## 2013-11-13 DIAGNOSIS — F209 Schizophrenia, unspecified: Secondary | ICD-10-CM | POA: Diagnosis not present

## 2013-11-13 DIAGNOSIS — M79602 Pain in left arm: Secondary | ICD-10-CM | POA: Diagnosis not present

## 2013-11-13 HISTORY — DX: Unspecified convulsions: R56.9

## 2013-11-13 MED ORDER — MELOXICAM 15 MG PO TABS: 15.0000 mg | ORAL_TABLET | Freq: Every day | ORAL | Status: DC

## 2013-11-13 NOTE — ED Notes (Addendum)
Both lower arms been hurting for a month.  Hurts in the forearms. Bilateral leg pain - very mild compared to the arms.  Last seizure a month ago.  Needs pain relief. Left arm hurting more.

## 2013-11-13 NOTE — Discharge Instructions (Signed)
Take mobic as needed for pain. Refer to attached documents for more information. Follow up with your doctor for further evaluation.

## 2013-11-13 NOTE — ED Notes (Signed)
To x-ray

## 2013-11-13 NOTE — ED Provider Notes (Signed)
CSN: [id number]     Arrival date & time 11/13/13  1211 History  This chart was scribed for non-physician practitioner, Alvina Chou, PA-C working with Janice Norrie, MD by Frederich Balding, ED scribe. This patient was seen in room TR11C/TR11C and the patient's care was started at 1:25 PM.   Chief Complaint  Patient presents with  . Arm Pain  . Leg Pain   The history is provided by the patient. No language interpreter was used.    HPI Comments: Chase Blackwell is a 48 y.o. male who presents to the Emergency Department complaining of intermittent right arm pain that started one month ago after a seizure. Pt takes daily roxicodone for chronic pain. States it has provided little relief.   Past Medical History  Diagnosis Date  . Hypertension   . Asthma   . GERD (gastroesophageal reflux disease)   . Chronic pain   . Schizophrenia   . Alcohol abuse   . Seizures    History reviewed. No pertinent past surgical history. Family History  Problem Relation Age of Onset  . Malignant hyperthermia Mother   . Cirrhosis Father   . Alcohol abuse Father    History  Substance Use Topics  . Smoking status: Current Every Day Smoker -- 1.00 packs/day    Types: Cigarettes  . Smokeless tobacco: Not on file  . Alcohol Use: 1.2 oz/week    2 Cans of beer per week     Comment: 1 400 oz per week    Review of Systems  Musculoskeletal: Positive for myalgias.  All other systems reviewed and are negative.  Allergies  Hctz; Hydroxyzine; Sulfonamide derivatives; and Cetirizine & related  Home Medications   Prior to Admission medications   Medication Sig Start Date End Date Taking? Authorizing Provider  aspirin 81 MG chewable tablet Chew 1 tablet (81 mg total) by mouth daily. 07/23/13   Donita Brooks, NP  atorvastatin (LIPITOR) 80 MG tablet Take 1 tablet (80 mg total) by mouth daily at 6 PM. 07/23/13   Donita Brooks, NP  busPIRone (BUSPAR) 5 MG tablet Take 5 mg by mouth 2 (two) times daily.     Historical Provider, MD  clopidogrel (PLAVIX) 75 MG tablet Take 75 mg by mouth daily.    Historical Provider, MD  divalproex (DEPAKOTE) 500 MG DR tablet Take 500 mg by mouth 2 (two) times daily.    Historical Provider, MD  famotidine (PEPCID) 20 MG tablet Take 20 mg by mouth daily.    Historical Provider, MD  folic acid (FOLVITE) 1 MG tablet Place 1 tablet (1 mg total) into feeding tube daily. 07/23/13   Donita Brooks, NP  furosemide (LASIX) 10 MG/ML solution Place 4 mLs (40 mg total) into feeding tube daily. 07/23/13   Donita Brooks, NP  haloperidol decanoate (HALDOL DECANOATE) 50 MG/ML injection Inject 100 mg into the muscle every 28 (twenty-eight) days.    Historical Provider, MD  hydrALAZINE (APRESOLINE) 25 MG tablet Take 10 mg by mouth 2 (two) times daily. 07/23/13   Donita Brooks, NP  levETIRAcetam (KEPPRA) 1000 MG tablet Take 1,000 mg by mouth 2 (two) times daily.    Historical Provider, MD  lisinopril (PRINIVIL,ZESTRIL) 2.5 MG tablet Take 2.5 mg by mouth daily.    Historical Provider, MD  Melatonin 5 MG TABS Take 1 tablet by mouth at bedtime.    Historical Provider, MD  metoprolol tartrate (LOPRESSOR) 25 MG tablet Take 12.5 mg by mouth 2 (two) times  daily. 07/23/13   Donita Brooks, NP  modafinil (PROVIGIL) 100 MG tablet Take one tablet by mouth every morning 08/30/13   Tiffany L Reed, DO  Multiple Vitamins-Minerals (CENTAMIN) LIQD Take 5 mLs by mouth daily.    Historical Provider, MD  Omega-3 Fatty Acids (SM FISH OIL) 1000 MG CAPS Take 6,000 mg by mouth daily.    Historical Provider, MD  oxyCODONE (ROXICODONE) 5 MG immediate release tablet Take one tablet by mouth twice daily for pain 10/01/13   Tiffany L Reed, DO  QUEtiapine (SEROQUEL) 50 MG tablet Take 50 mg by mouth at bedtime.    Historical Provider, MD  thiamine 100 MG tablet Place 1 tablet (100 mg total) into feeding tube daily. 07/23/13   Donita Brooks, NP  zolpidem (AMBIEN) 5 MG tablet Take 1 tablet (5 mg total) by mouth at bedtime as  needed for sleep. 08/30/13   Tiffany L Reed, DO   BP 116/66 mmHg  Pulse 120  Resp 16  SpO2 99%   Physical Exam  Constitutional: He is oriented to person, place, and time. He appears well-developed and well-nourished. No distress.  HENT:  Head: Normocephalic and atraumatic.  Eyes: Conjunctivae and EOM are normal.  Neck: Neck supple. No tracheal deviation present.  Cardiovascular: Normal rate.   Pulmonary/Chest: Effort normal. No respiratory distress.  Musculoskeletal: Normal range of motion.  Full ROM of left wrist and elbow. No obvious deformity or tenderness to palpation.   Neurological: He is alert and oriented to person, place, and time.  Skin: Skin is warm and dry.  Psychiatric: He has a normal mood and affect. His behavior is normal.  Nursing note and vitals reviewed.   ED Course  Procedures (including critical care time)  DIAGNOSTIC STUDIES: Oxygen Saturation is 99% on RA, normal by my interpretation.    COORDINATION OF CARE: 1:26 PM-Discussed treatment plan which includes mobic and RICE method with pt at bedside and pt agreed to plan.   Labs Review Labs Reviewed - No data to display  Imaging Review Dg Forearm Left  11/13/2013   CLINICAL DATA:  Pain for 1 month.  Trauma 1 month prior  EXAM: LEFT FOREARM - 2 VIEW  COMPARISON:  None.  FINDINGS: Frontal and lateral views were obtained. No fracture or dislocation. Joint spaces appear intact. Incidental note is made of a minus ulnar variant.  IMPRESSION: No fracture or dislocation.  No appreciable arthropathic change.   Electronically Signed   By: Lowella Grip M.D.   On: 11/13/2013 13:12     EKG Interpretation None      MDM   Final diagnoses:  Left arm pain    1:31 PM Xray unremarkable for acute changes. Patient will have mobic for pain. No neurovascular compromise.   I personally performed the services described in this documentation, which was scribed in my presence. The recorded information has been  reviewed and is accurate.  Alvina Chou, PA-C 11/13/13 Celina Knapp, MD 11/13/13 1551

## 2013-11-23 ENCOUNTER — Emergency Department (HOSPITAL_COMMUNITY)
Admission: EM | Admit: 2013-11-23 | Discharge: 2013-11-23 | Disposition: A | Discharge status: Home / Self Care | Payer: Medicare Other | Attending: Emergency Medicine | Admitting: Emergency Medicine

## 2013-11-23 ENCOUNTER — Encounter (HOSPITAL_COMMUNITY): Payer: Self-pay | Admitting: Emergency Medicine

## 2013-11-23 DIAGNOSIS — J45909 Unspecified asthma, uncomplicated: Secondary | ICD-10-CM | POA: Diagnosis not present

## 2013-11-23 DIAGNOSIS — G40909 Epilepsy, unspecified, not intractable, without status epilepticus: Secondary | ICD-10-CM | POA: Insufficient documentation

## 2013-11-23 DIAGNOSIS — Z8719 Personal history of other diseases of the digestive system: Secondary | ICD-10-CM | POA: Diagnosis not present

## 2013-11-23 DIAGNOSIS — Z720 Tobacco use: Secondary | ICD-10-CM | POA: Insufficient documentation

## 2013-11-23 DIAGNOSIS — F209 Schizophrenia, unspecified: Secondary | ICD-10-CM | POA: Diagnosis not present

## 2013-11-23 DIAGNOSIS — G8929 Other chronic pain: Secondary | ICD-10-CM | POA: Insufficient documentation

## 2013-11-23 DIAGNOSIS — Z7902 Long term (current) use of antithrombotics/antiplatelets: Secondary | ICD-10-CM | POA: Diagnosis not present

## 2013-11-23 DIAGNOSIS — I10 Essential (primary) hypertension: Secondary | ICD-10-CM | POA: Insufficient documentation

## 2013-11-23 DIAGNOSIS — Z7982 Long term (current) use of aspirin: Secondary | ICD-10-CM | POA: Diagnosis not present

## 2013-11-23 DIAGNOSIS — Z791 Long term (current) use of non-steroidal anti-inflammatories (NSAID): Secondary | ICD-10-CM | POA: Insufficient documentation

## 2013-11-23 DIAGNOSIS — R569 Unspecified convulsions: Secondary | ICD-10-CM

## 2013-11-23 DIAGNOSIS — Z79899 Other long term (current) drug therapy: Secondary | ICD-10-CM | POA: Insufficient documentation

## 2013-11-23 DIAGNOSIS — F1210 Cannabis abuse, uncomplicated: Secondary | ICD-10-CM | POA: Diagnosis not present

## 2013-11-23 LAB — BASIC METABOLIC PANEL
ANION GAP: 16 — AB (ref 5–15)
BUN: 7 mg/dL (ref 6–23)
CALCIUM: 9.8 mg/dL (ref 8.4–10.5)
CO2: 22 mEq/L (ref 19–32)
Chloride: 107 mEq/L (ref 96–112)
Creatinine, Ser: 0.76 mg/dL (ref 0.50–1.35)
GFR calc Af Amer: >90 mL/min (ref >90)
Glucose, Bld: 104 mg/dL — ABNORMAL HIGH (ref 70–99)
Potassium: 4.5 mEq/L (ref 3.7–5.3)
Sodium: 145 mEq/L (ref 137–147)

## 2013-11-23 LAB — RAPID URINE DRUG SCREEN, HOSP PERFORMED
Amphetamines: NOT DETECTED
BARBITURATES: NOT DETECTED
Benzodiazepines: NOT DETECTED
Cocaine: NOT DETECTED
Opiates: NOT DETECTED
Tetrahydrocannabinol: POSITIVE — AB

## 2013-11-23 LAB — URINALYSIS, ROUTINE W REFLEX MICROSCOPIC
BILIRUBIN URINE: NEGATIVE
Glucose, UA: NEGATIVE mg/dL
Hgb urine dipstick: NEGATIVE
KETONES UR: NEGATIVE mg/dL
LEUKOCYTES UA: NEGATIVE
NITRITE: NEGATIVE
PROTEIN: NEGATIVE mg/dL
Specific Gravity, Urine: 1.007 (ref 1.005–1.030)
Urobilinogen, UA: 0.2 mg/dL (ref 0.0–1.0)
pH: 7 (ref 5.0–8.0)

## 2013-11-23 LAB — CBC
HCT: 46.5 % (ref 39.0–52.0)
Hemoglobin: 15.6 g/dL (ref 13.0–17.0)
MCH: 31 pg (ref 26.0–34.0)
MCHC: 33.5 g/dL (ref 30.0–36.0)
MCV: 92.4 fL (ref 78.0–100.0)
Platelets: 269 10*3/uL (ref 150–400)
RBC: 5.03 MIL/uL (ref 4.22–5.81)
RDW: 13 % (ref 11.5–15.5)
WBC: 5.7 10*3/uL (ref 4.0–10.5)

## 2013-11-23 LAB — ETHANOL

## 2013-11-23 LAB — VALPROIC ACID LEVEL: Valproic Acid Lvl: 12.6 ug/mL — ABNORMAL LOW (ref 50.0–100.0)

## 2013-11-23 MED ORDER — HYDRALAZINE HCL 10 MG PO TABS: 10.0000 mg | ORAL_TABLET | Freq: Two times a day (BID) | ORAL | Status: DC

## 2013-11-23 MED ORDER — FAMOTIDINE 20 MG PO TABS: 20.0000 mg | ORAL_TABLET | Freq: Two times a day (BID) | ORAL | Status: DC

## 2013-11-23 MED ORDER — ASPIRIN 81 MG PO CHEW: 81.0000 mg | CHEWABLE_TABLET | Freq: Every day | ORAL | Status: DC

## 2013-11-23 MED ORDER — METOPROLOL TARTRATE 25 MG PO TABS: 12.5000 mg | ORAL_TABLET | Freq: Two times a day (BID) | ORAL | Status: DC

## 2013-11-23 MED ORDER — DIVALPROEX SODIUM 500 MG PO DR TAB: 500.0000 mg | DELAYED_RELEASE_TABLET | Freq: Two times a day (BID) | ORAL | Status: DC

## 2013-11-23 MED ORDER — LORAZEPAM 2 MG/ML IJ SOLN: 1.0000 mg | Freq: Once | INTRAMUSCULAR | Status: DC

## 2013-11-23 MED ORDER — ALBUTEROL SULFATE HFA 108 (90 BASE) MCG/ACT IN AERS: 1.0000 | INHALATION_SPRAY | RESPIRATORY_TRACT | Status: DC | PRN

## 2013-11-23 MED ORDER — DIVALPROEX SODIUM 500 MG PO DR TAB
500.0000 mg | DELAYED_RELEASE_TABLET | Freq: Once | ORAL | Status: AC
  Administered 2013-11-23: 500 mg via ORAL
  Filled 2013-11-23: qty 1

## 2013-11-23 MED ORDER — CLOPIDOGREL BISULFATE 75 MG PO TABS: 75.0000 mg | ORAL_TABLET | Freq: Every day | ORAL | Status: DC

## 2013-11-23 MED ORDER — LISINOPRIL 2.5 MG PO TABS: 2.5000 mg | ORAL_TABLET | Freq: Every day | ORAL | Status: DC

## 2013-11-23 MED ORDER — LEVETIRACETAM 1000 MG PO TABS: 1000.0000 mg | ORAL_TABLET | Freq: Two times a day (BID) | ORAL | Status: DC

## 2013-11-23 MED ORDER — QUETIAPINE FUMARATE 50 MG PO TABS: 50.0000 mg | ORAL_TABLET | Freq: Every day | ORAL | Status: DC

## 2013-11-23 MED ORDER — FUROSEMIDE 40 MG PO TABS: 40.0000 mg | ORAL_TABLET | Freq: Every day | ORAL | Status: DC

## 2013-11-23 MED ORDER — LEVETIRACETAM 500 MG PO TABS
1000.0000 mg | ORAL_TABLET | Freq: Once | ORAL | Status: AC
  Administered 2013-11-23: 1000 mg via ORAL
  Filled 2013-11-23: qty 2

## 2013-11-23 MED ORDER — ATORVASTATIN CALCIUM 80 MG PO TABS: 80.0000 mg | ORAL_TABLET | Freq: Every day | ORAL | Status: DC

## 2013-11-23 NOTE — Discharge Instructions (Signed)

## 2013-11-23 NOTE — ED Notes (Signed)
Per EMS-visiting mom at nursing home. Says he was shaking as he was sitting down -lasting 5 minutes (witnessed by Nursing Home). Hx seizures. Unsure if patient is on medications for seizures. Post ictal-uncooperative. Able to report his name and is alert. Found sitting in recliner. Did not hit head. BP 119/59 CBG 97 HR (ST) 104 SpO2 97% on RA.

## 2013-11-23 NOTE — ED Notes (Signed)
Pt aware urine sample is needed. Gross incontinence noted on arrival. Will remind patient promptly.

## 2013-11-23 NOTE — ED Notes (Signed)
Bed: WA20 Expected date:  Expected time:  Means of arrival:  Comments: EMS-SZ

## 2013-11-23 NOTE — ED Provider Notes (Signed)
TIME SEEN: 3:11 PM  CHIEF COMPLAINT: Seizure  HPI: Pt is a 48 y.o. M with history of hypertension, schizophrenia, alcohol abuse, seizures on Depakote and Keppra, h/o anoxic brain injury who presents to the emergency department via EMS with a seizure today. Patient was visiting his mother at her nursing home when he had a seizure that was witnessed by nursing home staff while sitting in a chair. He did not fall out of the chair. Patient denies any pain currently. Still appears postictal. Blood glucose was EMS was 97.  ROS: Level V caveat for altered mental status  PAST MEDICAL HISTORY/PAST SURGICAL HISTORY:  Past Medical History  Diagnosis Date  . Hypertension   . Asthma   . GERD (gastroesophageal reflux disease)   . Chronic pain   . Schizophrenia   . Alcohol abuse   . Seizures     MEDICATIONS:  Prior to Admission medications   Medication Sig Start Date End Date Taking? Authorizing Provider  aspirin 81 MG chewable tablet Chew 1 tablet (81 mg total) by mouth daily. 07/23/13   Donita Brooks, NP  atorvastatin (LIPITOR) 80 MG tablet Take 1 tablet (80 mg total) by mouth daily at 6 PM. 07/23/13   Donita Brooks, NP  busPIRone (BUSPAR) 5 MG tablet Take 5 mg by mouth 2 (two) times daily.    Historical Provider, MD  clopidogrel (PLAVIX) 75 MG tablet Take 75 mg by mouth daily.    Historical Provider, MD  divalproex (DEPAKOTE) 500 MG DR tablet Take 500 mg by mouth 2 (two) times daily.    Historical Provider, MD  famotidine (PEPCID) 20 MG tablet Take 20 mg by mouth daily.    Historical Provider, MD  folic acid (FOLVITE) 1 MG tablet Place 1 tablet (1 mg total) into feeding tube daily. 07/23/13   Donita Brooks, NP  furosemide (LASIX) 10 MG/ML solution Place 4 mLs (40 mg total) into feeding tube daily. 07/23/13   Donita Brooks, NP  haloperidol decanoate (HALDOL DECANOATE) 50 MG/ML injection Inject 100 mg into the muscle every 28 (twenty-eight) days.    Historical Provider, MD  hydrALAZINE (APRESOLINE)  25 MG tablet Take 10 mg by mouth 2 (two) times daily. 07/23/13   Donita Brooks, NP  levETIRAcetam (KEPPRA) 1000 MG tablet Take 1,000 mg by mouth 2 (two) times daily.    Historical Provider, MD  lisinopril (PRINIVIL,ZESTRIL) 2.5 MG tablet Take 2.5 mg by mouth daily.    Historical Provider, MD  Melatonin 5 MG TABS Take 1 tablet by mouth at bedtime.    Historical Provider, MD  meloxicam (MOBIC) 15 MG tablet Take 1 tablet (15 mg total) by mouth daily. 11/13/13   Kaitlyn Szekalski, PA-C  metoprolol tartrate (LOPRESSOR) 25 MG tablet Take 12.5 mg by mouth 2 (two) times daily. 07/23/13   Donita Brooks, NP  modafinil (PROVIGIL) 100 MG tablet Take one tablet by mouth every morning 08/30/13   Tiffany L Reed, DO  Multiple Vitamins-Minerals (CENTAMIN) LIQD Take 5 mLs by mouth daily.    Historical Provider, MD  Omega-3 Fatty Acids (SM FISH OIL) 1000 MG CAPS Take 6,000 mg by mouth daily.    Historical Provider, MD  oxyCODONE (ROXICODONE) 5 MG immediate release tablet Take one tablet by mouth twice daily for pain 10/01/13   Tiffany L Reed, DO  QUEtiapine (SEROQUEL) 50 MG tablet Take 50 mg by mouth at bedtime.    Historical Provider, MD  thiamine 100 MG tablet Place 1 tablet (100 mg  total) into feeding tube daily. 07/23/13   Donita Brooks, NP  zolpidem (AMBIEN) 5 MG tablet Take 1 tablet (5 mg total) by mouth at bedtime as needed for sleep. 08/30/13   Tiffany L Reed, DO    ALLERGIES:  Allergies  Allergen Reactions  . Hctz [Hydrochlorothiazide] Other (See Comments)    Dizzy spells  . Hydroxyzine Hives  . Sulfonamide Derivatives Hives  . Cetirizine & Related Other (See Comments)    unknown    SOCIAL HISTORY:  History  Substance Use Topics  . Smoking status: Current Every Day Smoker -- 1.00 packs/day    Types: Cigarettes  . Smokeless tobacco: Not on file  . Alcohol Use: 1.2 oz/week    2 Cans of beer per week     Comment: 1 400 oz per week    FAMILY HISTORY: Family History  Problem Relation Age of  Onset  . Malignant hyperthermia Mother   . Cirrhosis Father   . Alcohol abuse Father     EXAM: BP 124/78 mmHg  Pulse 87  Temp(Src) 97.9 F (36.6 C) (Oral)  Resp 20  SpO2 99% CONSTITUTIONAL: Alert and oriented to person only and still appears postictal, in no apparent distress HEAD: Normocephalic; atraumatic EYES: Conjunctivae clear, PERRL ENT: normal nose; no rhinorrhea; moist mucous membranes; pharynx without lesions noted NECK: Supple, no meningismus, no LAD  CARD: RRR; S1 and S2 appreciated; no murmurs, no clicks, no rubs, no gallops RESP: Normal chest excursion without splinting or tachypnea; breath sounds clear and equal bilaterally; no wheezes, no rhonchi, no rales, no hypoxia or respiratory distress, no increased work of breathing ABD/GI: Normal bowel sounds; non-distended; soft, non-tender, no rebound, no guarding BACK:  The back appears normal and is non-tender to palpation, there is no CVA tenderness; no midline spinal tenderness or step-off or deformity EXT: Normal ROM in all joints; non-tender to palpation; no edema; normal capillary refill; no cyanosis; no calf tenderness or swelling, no bony tenderness, no joint effusions    SKIN: Normal color for age and race; warm; no lesions NEURO: Moves all extremities equally, no pronator drift, no facial droop or slurred speech, cranial nerves II through VII intact, patient reports sensation to light touch intact diffusely PSYCH: The patient's mood and manner are appropriate. Grooming and personal hygiene are appropriate.  MEDICAL DECISION MAKING: Pt here with history of seizures on Depakote and Keppra who had a seizure while visiting his mother at the nursing home. He is still post ictal but has no focal neurologic deficit otherwise is hemodynamically stable. Will check labs including Depakote level, urine. He also has a history of alcohol abuse, question could this be alcohol withdrawal. We'll continue to closely monitor and reassess  frequently.  ED PROGRESS: Pt is improving and has no complaints. He is still disoriented to time but his brother reports that this is chronic after anoxic brain injury. Patient lives with his family and they provide him his medications. They report they ran out of his Depakote and Keppra yesterday and he is not receiving these medications today. We'll give a dose of Keppra in the emergency department. Depakote level pending. Otherwise labs and urine are unremarkable. Family reports that he is not had alcohol in several months.    6:47 PM  Pt's niece is at bedside. She reports the patient seems to be at his baseline. He is asking to go home. His Depakote level is low. We'll give a dose of Depakote in the ER.  Family reports that  he does not have a PCP or cardiologist for follow-up but will give  and wellness information, outpatient resource guide and [facility] information. We'll give refills of medications as family reports he only has 1 more week and doesn't have follow-up.      EKG Interpretation  Date/Time:  Friday November 23 2013 14:24:36 EST Ventricular Rate:  94 PR Interval:  166 QRS Duration: 92 QT Interval:  350 QTC Calculation: 438 R Axis:   75 Text Interpretation:  Sinus rhythm Consider left atrial enlargement No significant change since last tracing Confirmed by [place],  DO, KRISTEN [phone]) on 11/23/2013 3:58:49 PM        H[place], DO 11/23/13 1931

## 2013-12-07 ENCOUNTER — Encounter (HOSPITAL_COMMUNITY): Payer: Self-pay | Admitting: Nurse Practitioner

## 2013-12-07 ENCOUNTER — Emergency Department (HOSPITAL_COMMUNITY)
Admission: EM | Admit: 2013-12-07 | Discharge: 2013-12-07 | Disposition: A | Discharge status: Home / Self Care | Payer: Medicare Other | Attending: Emergency Medicine | Admitting: Emergency Medicine

## 2013-12-07 DIAGNOSIS — Z720 Tobacco use: Secondary | ICD-10-CM | POA: Diagnosis not present

## 2013-12-07 DIAGNOSIS — F209 Schizophrenia, unspecified: Secondary | ICD-10-CM | POA: Insufficient documentation

## 2013-12-07 DIAGNOSIS — G8929 Other chronic pain: Secondary | ICD-10-CM | POA: Insufficient documentation

## 2013-12-07 DIAGNOSIS — IMO0002 Reserved for concepts with insufficient information to code with codable children: Secondary | ICD-10-CM

## 2013-12-07 DIAGNOSIS — G40909 Epilepsy, unspecified, not intractable, without status epilepticus: Secondary | ICD-10-CM | POA: Diagnosis not present

## 2013-12-07 DIAGNOSIS — Z7902 Long term (current) use of antithrombotics/antiplatelets: Secondary | ICD-10-CM | POA: Diagnosis not present

## 2013-12-07 DIAGNOSIS — I10 Essential (primary) hypertension: Secondary | ICD-10-CM | POA: Diagnosis not present

## 2013-12-07 DIAGNOSIS — Z791 Long term (current) use of non-steroidal anti-inflammatories (NSAID): Secondary | ICD-10-CM | POA: Insufficient documentation

## 2013-12-07 DIAGNOSIS — M25559 Pain in unspecified hip: Secondary | ICD-10-CM | POA: Diagnosis present

## 2013-12-07 DIAGNOSIS — F10129 Alcohol abuse with intoxication, unspecified: Secondary | ICD-10-CM | POA: Diagnosis not present

## 2013-12-07 DIAGNOSIS — J45909 Unspecified asthma, uncomplicated: Secondary | ICD-10-CM | POA: Diagnosis not present

## 2013-12-07 DIAGNOSIS — Z79899 Other long term (current) drug therapy: Secondary | ICD-10-CM | POA: Diagnosis not present

## 2013-12-07 DIAGNOSIS — K219 Gastro-esophageal reflux disease without esophagitis: Secondary | ICD-10-CM | POA: Diagnosis not present

## 2013-12-07 DIAGNOSIS — Z7982 Long term (current) use of aspirin: Secondary | ICD-10-CM | POA: Diagnosis not present

## 2013-12-07 DIAGNOSIS — M25552 Pain in left hip: Secondary | ICD-10-CM | POA: Insufficient documentation

## 2013-12-07 LAB — CBC WITH DIFFERENTIAL/PLATELET
BASOS PCT: 0 % (ref 0–1)
Basophils Absolute: 0 10*3/uL (ref 0.0–0.1)
EOS ABS: 0.1 10*3/uL (ref 0.0–0.7)
EOS PCT: 1 % (ref 0–5)
HCT: 43.2 % (ref 39.0–52.0)
Hemoglobin: 14.5 g/dL (ref 13.0–17.0)
Lymphocytes Relative: 16 % (ref 12–46)
Lymphs Abs: 1.1 10*3/uL (ref 0.7–4.0)
MCH: 30.7 pg (ref 26.0–34.0)
MCHC: 33.6 g/dL (ref 30.0–36.0)
MCV: 91.5 fL (ref 78.0–100.0)
Monocytes Absolute: 0.5 10*3/uL (ref 0.1–1.0)
Monocytes Relative: 8 % (ref 3–12)
NEUTROS PCT: 75 % (ref 43–77)
Neutro Abs: 5 10*3/uL (ref 1.7–7.7)
PLATELETS: 199 10*3/uL (ref 150–400)
RBC: 4.72 MIL/uL (ref 4.22–5.81)
RDW: 13.4 % (ref 11.5–15.5)
WBC: 6.7 10*3/uL (ref 4.0–10.5)

## 2013-12-07 LAB — COMPREHENSIVE METABOLIC PANEL
ALBUMIN: 3.8 g/dL (ref 3.5–5.2)
ALK PHOS: 64 U/L (ref 39–117)
ALT: 7 U/L (ref 0–53)
AST: 11 U/L (ref 0–37)
Anion gap: 11 (ref 5–15)
BUN: 10 mg/dL (ref 6–23)
CALCIUM: 9.5 mg/dL (ref 8.4–10.5)
CO2: 27 mEq/L (ref 19–32)
Chloride: 106 mEq/L (ref 96–112)
Creatinine, Ser: 0.81 mg/dL (ref 0.50–1.35)
GFR calc non Af Amer: >90 mL/min (ref >90)
GLUCOSE: 91 mg/dL (ref 70–99)
POTASSIUM: 4.9 meq/L (ref 3.7–5.3)
Sodium: 144 mEq/L (ref 137–147)
TOTAL PROTEIN: 6.8 g/dL (ref 6.0–8.3)
Total Bilirubin: 0.3 mg/dL (ref 0.3–1.2)

## 2013-12-07 LAB — LIPASE, BLOOD: Lipase: 17 U/L (ref 11–59)

## 2013-12-07 LAB — ETHANOL

## 2013-12-07 LAB — ACETAMINOPHEN LEVEL

## 2013-12-07 LAB — SALICYLATE LEVEL

## 2013-12-07 NOTE — ED Notes (Signed)
Pt requesting to leave before imaging done.  PA-C aware, pt escorted to bathroom, is able to ambulate without assistance.

## 2013-12-07 NOTE — ED Provider Notes (Signed)
CSN: [id number]     Arrival date & time 12/07/13  1113 History   First MD Initiated Contact with Patient 12/07/13 1130     Chief Complaint  Patient presents with  . Hip Pain   HPI  Patient is a 48 year old male with a past medical history of hypertension, schizophrenia, alcohol abuse, and seizures who presents emergency room for evaluation of hip pain. Per EMS note the patient was walking strangely at [facility] where he was going to visit his mother. They noted that he was a little bit more disheveled than normal. Walking with a limp is unusual for him. They called EMS to have him brought here for evaluation. Upon interview patient states that he has had a little bit of left-sided hip pain which only bothers him when he is walking. He denies any injury. Patient states that he was drinking alcohol last night. He admits to drinking a 40 ounce of beer and smoking several joints. He states he has had nothing today. Patient denies any fevers, chills, nausea, vomiting, redness, or joint swelling.  Past Medical History  Diagnosis Date  . Hypertension   . Asthma   . GERD (gastroesophageal reflux disease)   . Chronic pain   . Schizophrenia   . Alcohol abuse   . Seizures    History reviewed. No pertinent past surgical history. Family History  Problem Relation Age of Onset  . Malignant hyperthermia Mother   . Cirrhosis Father   . Alcohol abuse Father    History  Substance Use Topics  . Smoking status: Current Every Day Smoker -- 1.00 packs/day    Types: Cigarettes  . Smokeless tobacco: Not on file  . Alcohol Use: 1.2 oz/week    2 Cans of beer per week     Comment: 1 400 oz per week    Review of Systems  Constitutional: Negative for fever, chills and fatigue.  Gastrointestinal: Negative for nausea and vomiting.  Musculoskeletal: Positive for arthralgias. Negative for joint swelling and gait problem.  Skin: Negative for color change.  Psychiatric/Behavioral: Negative for confusion.  All  other systems reviewed and are negative.     Allergies  Hctz; Hydroxyzine; Sulfonamide derivatives; and Cetirizine & related  Home Medications   Prior to Admission medications   Medication Sig Start Date End Date Taking? Authorizing Provider  albuterol (PROVENTIL HFA;VENTOLIN HFA) 108 (90 BASE) MCG/ACT inhaler Inhale 1-2 puffs into the lungs every 4 (four) hours as needed for wheezing or shortness of breath. 11/23/13   Kristen N Bowne, DO  aspirin 81 MG chewable tablet Chew 1 tablet (81 mg total) by mouth daily. 11/23/13   Kristen N Mcphillips, DO  atorvastatin (LIPITOR) 80 MG tablet Take 1 tablet (80 mg total) by mouth at bedtime. 11/23/13   Kristen N Lovan, DO  busPIRone (BUSPAR) 5 MG tablet Take 5 mg by mouth 2 (two) times daily.    Historical Provider, MD  clopidogrel (PLAVIX) 75 MG tablet Take 1 tablet (75 mg total) by mouth daily. 11/23/13   Kristen N Kunzler, DO  divalproex (DEPAKOTE) 500 MG DR tablet Take 1 tablet (500 mg total) by mouth 2 (two) times daily. 11/23/13   Kristen N Waynick, DO  famotidine (PEPCID) 20 MG tablet Take 1 tablet (20 mg total) by mouth 2 (two) times daily. 11/23/13   Kristen N Harned, DO  folic acid (FOLVITE) 1 MG tablet Place 1 tablet (1 mg total) into feeding tube daily. Patient taking differently: Take 1 mg by mouth daily.  07/23/13   Donita Brooks, NP  furosemide (LASIX) 10 MG/ML solution Place 4 mLs (40 mg total) into feeding tube daily. 07/23/13   Donita Brooks, NP  furosemide (LASIX) 40 MG tablet Take 1 tablet (40 mg total) by mouth daily. 11/23/13   Kristen N Go, DO  haloperidol decanoate (HALDOL DECANOATE) 50 MG/ML injection Inject 100 mg into the muscle every 28 (twenty-eight) days.    Historical Provider, MD  hydrALAZINE (APRESOLINE) 10 MG tablet Take 1 tablet (10 mg total) by mouth 2 (two) times daily. 11/23/13   Kristen N Conerly, DO  levETIRAcetam (KEPPRA) 1000 MG tablet Take 1 tablet (1,000 mg total) by mouth 2 (two) times daily. 11/23/13   Kristen N Tolle, DO   lisinopril (PRINIVIL,ZESTRIL) 2.5 MG tablet Take 1 tablet (2.5 mg total) by mouth daily. 11/23/13   Kristen N Eager, DO  LORazepam (ATIVAN) 0.5 MG tablet Take 0.5 mg by mouth every 8 (eight) hours as needed for anxiety.    Historical Provider, MD  Melatonin 5 MG TABS Take 1 tablet by mouth at bedtime.    Historical Provider, MD  meloxicam (MOBIC) 15 MG tablet Take 1 tablet (15 mg total) by mouth daily. 11/13/13   Kaitlyn Szekalski, PA-C  metoprolol tartrate (LOPRESSOR) 25 MG tablet Take 0.5 tablets (12.5 mg total) by mouth 2 (two) times daily. 11/23/13   Kristen N Lenz, DO  modafinil (PROVIGIL) 100 MG tablet Take one tablet by mouth every morning Patient not taking: Reported on 11/23/2013 08/30/13   Tiffany L Reed, DO  Multiple Vitamins-Minerals (CENTAMIN) LIQD Take 5 mLs by mouth daily.    Historical Provider, MD  Omega-3 Fatty Acids (SM FISH OIL) 1000 MG CAPS Take 6,000 mg by mouth daily.    Historical Provider, MD  oxyCODONE (ROXICODONE) 5 MG immediate release tablet Take one tablet by mouth twice daily for pain 10/01/13   Tiffany L Reed, DO  QUEtiapine (SEROQUEL) 50 MG tablet Take 1 tablet (50 mg total) by mouth at bedtime. 11/23/13   Kristen N Armendarez, DO  thiamine 100 MG tablet Place 1 tablet (100 mg total) into feeding tube daily. Patient taking differently: Take 100 mg by mouth daily.  07/23/13   Donita Brooks, NP  zolpidem (AMBIEN) 5 MG tablet Take 1 tablet (5 mg total) by mouth at bedtime as needed for sleep. 08/30/13   Tiffany L Reed, DO   BP 129/78 mmHg  Pulse 86  Temp(Src) 97.7 F (36.5 C) (Oral)  Resp 19  SpO2 96% Physical Exam  Constitutional: He is oriented to person, place, and time. He appears well-developed and well-nourished. No distress.  HENT:  Head: Normocephalic and atraumatic.  Mouth/Throat: Oropharynx is clear and moist. No oropharyngeal exudate.  Eyes: Conjunctivae and EOM are normal. Pupils are equal, round, and reactive to light. No scleral icterus.  Neck: Normal  range of motion. Neck supple. No JVD present. No thyromegaly present.  Cardiovascular: Normal rate, regular rhythm, normal heart sounds and intact distal pulses.  Exam reveals no gallop and no friction rub.   No murmur heard. Pulmonary/Chest: Effort normal and breath sounds normal. No respiratory distress. He has no wheezes. He has no rales. He exhibits no tenderness.  Abdominal: Soft. Bowel sounds are normal. He exhibits no distension and no mass. There is no tenderness. There is no rebound and no guarding.  Musculoskeletal:  There is full passive range of motion of the bilateral lower extremities without pain. There is mild discomfort of the left hip with  internal and external rotation. There are no obvious deformities. There is no redness or swelling of the hips, knees, or ankles bilaterally.  Lymphadenopathy:    He has no cervical adenopathy.  Neurological: He is alert and oriented to person, place, and time. He has normal strength. No cranial nerve deficit or sensory deficit.  Patient speaks with slightly slurred speech. There is no pronator drift.  Skin: Skin is warm and dry. He is not diaphoretic.  Psychiatric: He has a normal mood and affect. His behavior is normal. Judgment and thought content normal.  Nursing note and vitals reviewed.   ED Course  Procedures (including critical care time) Labs Review Labs Reviewed  CBC WITH DIFFERENTIAL  COMPREHENSIVE METABOLIC PANEL  LIPASE, BLOOD  ACETAMINOPHEN LEVEL  SALICYLATE LEVEL  ETHANOL  URINE RAPID DRUG SCREEN (HOSP PERFORMED)    Imaging Review No results found.   EKG Interpretation None      MDM   Final diagnoses:  Hip pain  Left hip pain   Patient is a 48 year old male who presents to the emergency room for evaluation of left hip pain. Physical exam reveals neurovascularly intact bilateral lower extremities. Patient is able to hold the coherent conversation and is able to ambulate with a steady gait. Patient is asking  to go home. I am unable to complete my workup. Patient is clinically sober enough to be discharged at this time. He denies suicidal or homicidal ideations at this time. Patient is declining hip x-ray. Patient is stable for discharge at this time. I have told the patient to return for worsening hip pain, sudden collapse of the joint, or any other concerning symptoms. Patient states understanding and agreement at this time. Patient was discussed with Dr. Tomi Bamberger who agrees with the above workup and plan.    Cherylann Parr, PA-C 12/07/13 1313

## 2013-12-07 NOTE — ED Provider Notes (Addendum)
Pt is awake and alert.  Denies any ETOH use this am.  Does not want to be evaluated in the ED further.  Pt is able to stand without difficulty.  Pt appears to have appropriate decision making capacity. He may leave without further evaluation .  Medical screening examination/treatment/procedure(s) were conducted as a shared visit with non-physician practitioner(s) and myself.  I personally evaluated the patient during the encounter.   Dorie Rank, MD 12/07/13 1312  Dorie Rank, MD 12/07/13 [id number]

## 2013-12-07 NOTE — ED Notes (Signed)
Per EMS pt was walking to visit mom and [facility] and staff called EMS due to patient "walking funny" pt limping on right side. Pt endorses pain in right hip with walking, denies pain at present time. Denies EtOH use, sts used marijuana last night. Pt appearance is dishelved, denies drug use at recently.

## 2013-12-07 NOTE — Discharge Instructions (Signed)

## 2013-12-13 ENCOUNTER — Encounter (HOSPITAL_COMMUNITY): Payer: Self-pay | Admitting: Cardiology

## 2014-01-04 DIAGNOSIS — K859 Acute pancreatitis without necrosis or infection, unspecified: Secondary | ICD-10-CM

## 2014-01-04 HISTORY — DX: Acute pancreatitis without necrosis or infection, unspecified: K85.90

## 2014-01-12 ENCOUNTER — Emergency Department (HOSPITAL_COMMUNITY)
Admission: EM | Admit: 2014-01-12 | Discharge: 2014-01-12 | Discharge status: Against Medical Advice | Payer: Medicare Other | Attending: Emergency Medicine | Admitting: Emergency Medicine

## 2014-01-12 ENCOUNTER — Encounter (HOSPITAL_COMMUNITY): Payer: Self-pay | Admitting: Family Medicine

## 2014-01-12 ENCOUNTER — Emergency Department (HOSPITAL_COMMUNITY): Payer: Medicare Other

## 2014-01-12 DIAGNOSIS — G40909 Epilepsy, unspecified, not intractable, without status epilepticus: Secondary | ICD-10-CM | POA: Insufficient documentation

## 2014-01-12 DIAGNOSIS — Z7982 Long term (current) use of aspirin: Secondary | ICD-10-CM | POA: Diagnosis not present

## 2014-01-12 DIAGNOSIS — R109 Unspecified abdominal pain: Secondary | ICD-10-CM | POA: Diagnosis present

## 2014-01-12 DIAGNOSIS — Z7902 Long term (current) use of antithrombotics/antiplatelets: Secondary | ICD-10-CM | POA: Diagnosis not present

## 2014-01-12 DIAGNOSIS — J45909 Unspecified asthma, uncomplicated: Secondary | ICD-10-CM | POA: Diagnosis not present

## 2014-01-12 DIAGNOSIS — I10 Essential (primary) hypertension: Secondary | ICD-10-CM | POA: Insufficient documentation

## 2014-01-12 DIAGNOSIS — K219 Gastro-esophageal reflux disease without esophagitis: Secondary | ICD-10-CM | POA: Insufficient documentation

## 2014-01-12 DIAGNOSIS — Z79899 Other long term (current) drug therapy: Secondary | ICD-10-CM | POA: Insufficient documentation

## 2014-01-12 DIAGNOSIS — G8929 Other chronic pain: Secondary | ICD-10-CM | POA: Diagnosis not present

## 2014-01-12 DIAGNOSIS — F209 Schizophrenia, unspecified: Secondary | ICD-10-CM | POA: Insufficient documentation

## 2014-01-12 DIAGNOSIS — K859 Acute pancreatitis, unspecified: Secondary | ICD-10-CM | POA: Diagnosis not present

## 2014-01-12 LAB — COMPREHENSIVE METABOLIC PANEL
ALT: 9 U/L (ref 0–53)
AST: 14 U/L (ref 0–37)
Albumin: 3.8 g/dL (ref 3.5–5.2)
Alkaline Phosphatase: 65 U/L (ref 39–117)
Anion gap: 8 (ref 5–15)
CO2: 26 mmol/L (ref 19–32)
Calcium: 9.2 mg/dL (ref 8.4–10.5)
Chloride: 105 mEq/L (ref 96–112)
Creatinine, Ser: 0.93 mg/dL (ref 0.50–1.35)
GFR calc Af Amer: >90 mL/min (ref >90)
GFR calc non Af Amer: >90 mL/min (ref >90)
Glucose, Bld: 141 mg/dL — ABNORMAL HIGH (ref 70–99)
Potassium: 3.5 mmol/L (ref 3.5–5.1)
Sodium: 139 mmol/L (ref 135–145)
Total Bilirubin: 0.6 mg/dL (ref 0.3–1.2)
Total Protein: 6.8 g/dL (ref 6.0–8.3)

## 2014-01-12 LAB — CBC WITH DIFFERENTIAL/PLATELET
BASOS PCT: 0 % (ref 0–1)
Basophils Absolute: 0 10*3/uL (ref 0.0–0.1)
EOS ABS: 0.2 10*3/uL (ref 0.0–0.7)
EOS PCT: 3 % (ref 0–5)
HCT: 44.4 % (ref 39.0–52.0)
HEMOGLOBIN: 15.6 g/dL (ref 13.0–17.0)
Lymphocytes Relative: 41 % (ref 12–46)
Lymphs Abs: 2.3 10*3/uL (ref 0.7–4.0)
MCH: 31.3 pg (ref 26.0–34.0)
MCHC: 35.1 g/dL (ref 30.0–36.0)
MCV: 89 fL (ref 78.0–100.0)
MONO ABS: 0.4 10*3/uL (ref 0.1–1.0)
MONOS PCT: 7 % (ref 3–12)
NEUTROS ABS: 2.8 10*3/uL (ref 1.7–7.7)
NEUTROS PCT: 49 % (ref 43–77)
Platelets: 190 10*3/uL (ref 150–400)
RBC: 4.99 MIL/uL (ref 4.22–5.81)
RDW: 14.1 % (ref 11.5–15.5)
WBC: 5.6 10*3/uL (ref 4.0–10.5)

## 2014-01-12 LAB — URINALYSIS, ROUTINE W REFLEX MICROSCOPIC
Bilirubin Urine: NEGATIVE
Glucose, UA: NEGATIVE mg/dL
Hgb urine dipstick: NEGATIVE
Ketones, ur: NEGATIVE mg/dL
Leukocytes, UA: NEGATIVE
NITRITE: NEGATIVE
PH: 7 (ref 5.0–8.0)
Protein, ur: NEGATIVE mg/dL
Specific Gravity, Urine: 1.011 (ref 1.005–1.030)
Urobilinogen, UA: 1 mg/dL (ref 0.0–1.0)

## 2014-01-12 LAB — ETHANOL

## 2014-01-12 LAB — LIPASE, BLOOD: Lipase: 190 U/L — ABNORMAL HIGH (ref 11–59)

## 2014-01-12 MED ORDER — SODIUM CHLORIDE 0.9 % IV BOLUS (SEPSIS)
1000.0000 mL | Freq: Once | INTRAVENOUS | Status: AC
  Administered 2014-01-12: 1000 mL via INTRAVENOUS

## 2014-01-12 MED ORDER — FENTANYL CITRATE 0.05 MG/ML IJ SOLN
100.0000 ug | Freq: Once | INTRAMUSCULAR | Status: AC
  Administered 2014-01-12: 100 ug via INTRAVENOUS
  Filled 2014-01-12: qty 2

## 2014-01-12 MED ORDER — IOHEXOL 300 MG/ML  SOLN
100.0000 mL | Freq: Once | INTRAMUSCULAR | Status: AC | PRN
  Administered 2014-01-12: 100 mL via INTRAVENOUS

## 2014-01-12 MED ORDER — HYDROMORPHONE HCL 1 MG/ML IJ SOLN: 1.0000 mg | Freq: Once | INTRAMUSCULAR | Status: DC

## 2014-01-12 NOTE — ED Notes (Signed)
Patient is unable to give a urine specimen at this time  

## 2014-01-12 NOTE — ED Notes (Signed)
Patient transported to CT 

## 2014-01-12 NOTE — ED Provider Notes (Signed)
49 year old male, history of chronic alcoholism, denies history of pancreatitis, no laboratory history or imaging history of pancreatitis in the electronic medical record. He reports epigastric pain, poor historian regarding timing, he does endorse eating "a lot of lasagna today". On exam the patient has mild diffuse abdominal tenderness focused in the epigastrium, no temporal headache sounds to percussion, mild guarding, no peritoneal signs. Normal heart and lung sounds, no murmur, no tachycardia, no peripheral edema, very poor dentition, clear oropharynx with moist mucous membranes.  Labs confirm what looks like pancreatitis, no leukocytosis, no hyperbilirubinemia or elevation in LFTs. Would consider pancreatitis or other intra-abdominal pathology. Check chest x-ray at the bedside for signs of perforation, CT scan if negative.  Pt left AMA after prolonged discussion about staying - he expresses his understanding to the potential complications and outcomes.  He has medical decision making capacity at this time.  Medical screening examination/treatment/procedure(s) were conducted as a shared visit with non-physician practitioner(s) and myself.  I personally evaluated the patient during the encounter.  Clinical Impression:   Final diagnoses:  Abdominal pain  Acute pancreatitis, unspecified pancreatitis type         Johnna Acosta, MD 01/12/14 2231

## 2014-01-12 NOTE — ED Notes (Signed)
Per pt sts abd pain x 3 days. Denies N,V,D. sts generalized pain.

## 2014-01-12 NOTE — Discharge Instructions (Signed)

## 2014-01-12 NOTE — ED Provider Notes (Signed)
CSN: [id number]     Arrival date & time 01/12/14  1412 History   First MD Initiated Contact with Patient 01/12/14 1606     Chief Complaint  Patient presents with  . Abdominal Pain   HPI   Patient is a 49 year old male with past medical history of hypertension, GERD, schizophrenia, alcohol abuse, and seizures who presents emergency room for evaluation of abdominal pain. Patient states that for the past several days he has had intermittent stabbing generalized abdominal pain. He is unsure if any aggravating factors at this time. He has not found any relieving factors at this time. Patient states he has never had pain like this in the past. He states that eating has no effect. He does admit to some frequent urination and dark colored urine. He also notes that he has not had a bowel movement in some time although he cannot provide the date of his last bowel movement. Patient is a very poor historian. He denies drinking alcohol. He does admit to smoking cigarettes and marijuana. Patient denies fevers, chills, nausea, vomiting, diarrhea, melena, hematochezia, hematemesis, hematuria, penile discharge.  Past Medical History  Diagnosis Date  . Hypertension   . Asthma   . GERD (gastroesophageal reflux disease)   . Chronic pain   . Schizophrenia   . Alcohol abuse   . Seizures    Past Surgical History  Procedure Laterality Date  . Left heart catheterization with coronary angiogram N/A 07/06/2013    Procedure: LEFT HEART CATHETERIZATION WITH CORONARY ANGIOGRAM;  Surgeon: Clent Demark, MD;  Location: [facility] CATH LAB;  Service: Cardiovascular;  Laterality: N/A;  . Percutaneous coronary stent intervention (pci-s)  07/06/2013    Procedure: PERCUTANEOUS CORONARY STENT INTERVENTION (PCI-S);  Surgeon: Clent Demark, MD;  Location: [facility] CATH LAB;  Service: Cardiovascular;;   Family History  Problem Relation Age of Onset  . Malignant hyperthermia Mother   . Cirrhosis Father   . Alcohol abuse Father    History   Substance Use Topics  . Smoking status: Current Every Day Smoker -- 1.00 packs/day    Types: Cigarettes  . Smokeless tobacco: Not on file  . Alcohol Use: 1.2 oz/week    2 Cans of beer per week     Comment: 1 400 oz per week    Review of Systems  Constitutional: Negative for fever, chills and fatigue.  Respiratory: Negative for chest tightness and shortness of breath.   Cardiovascular: Negative for chest pain.  Gastrointestinal: Positive for abdominal pain and constipation. Negative for nausea, vomiting, diarrhea, blood in stool, abdominal distention and anal bleeding.  Genitourinary: Positive for dysuria and frequency. Negative for urgency, hematuria, discharge and difficulty urinating.  Musculoskeletal: Positive for myalgias.  All other systems reviewed and are negative.     Allergies  Hctz; Hydroxyzine; Sulfonamide derivatives; and Cetirizine & related  Home Medications   Prior to Admission medications   Medication Sig Start Date End Date Taking? Authorizing Provider  albuterol (PROVENTIL HFA;VENTOLIN HFA) 108 (90 BASE) MCG/ACT inhaler Inhale 1-2 puffs into the lungs every 4 (four) hours as needed for wheezing or shortness of breath. 11/23/13  Yes Kristen N Medford, DO  haloperidol decanoate (HALDOL DECANOATE) 50 MG/ML injection Inject 100 mg into the muscle every 28 (twenty-eight) days.   Yes Historical Provider, MD  LORazepam (ATIVAN) 0.5 MG tablet Take 0.5 mg by mouth every 8 (eight) hours as needed for anxiety.   Yes Historical Provider, MD  Melatonin 5 MG TABS Take 1 tablet by mouth  at bedtime.   Yes Historical Provider, MD  metoprolol tartrate (LOPRESSOR) 25 MG tablet Take 0.5 tablets (12.5 mg total) by mouth 2 (two) times daily. 11/23/13  Yes Kristen N Haque, DO  QUEtiapine (SEROQUEL) 50 MG tablet Take 1 tablet (50 mg total) by mouth at bedtime. 11/23/13  Yes Kristen N Swofford, DO  zolpidem (AMBIEN) 5 MG tablet Take 1 tablet (5 mg total) by mouth at bedtime as needed for sleep.  08/30/13  Yes Tiffany L Reed, DO  aspirin 81 MG chewable tablet Chew 1 tablet (81 mg total) by mouth daily. 11/23/13   Kristen N Cullifer, DO  atorvastatin (LIPITOR) 80 MG tablet Take 1 tablet (80 mg total) by mouth at bedtime. 11/23/13   Kristen N Calandra, DO  busPIRone (BUSPAR) 5 MG tablet Take 5 mg by mouth 2 (two) times daily.    Historical Provider, MD  clopidogrel (PLAVIX) 75 MG tablet Take 1 tablet (75 mg total) by mouth daily. 11/23/13   Kristen N Rosiak, DO  divalproex (DEPAKOTE) 500 MG DR tablet Take 1 tablet (500 mg total) by mouth 2 (two) times daily. 11/23/13   Kristen N Lepage, DO  famotidine (PEPCID) 20 MG tablet Take 1 tablet (20 mg total) by mouth 2 (two) times daily. 11/23/13   Kristen N Hopes, DO  folic acid (FOLVITE) 1 MG tablet Place 1 tablet (1 mg total) into feeding tube daily. Patient taking differently: Take 1 mg by mouth daily.  07/23/13   Donita Brooks, NP  furosemide (LASIX) 10 MG/ML solution Place 4 mLs (40 mg total) into feeding tube daily. 07/23/13   Donita Brooks, NP  furosemide (LASIX) 40 MG tablet Take 1 tablet (40 mg total) by mouth daily. 11/23/13   Kristen N Souffrant, DO  hydrALAZINE (APRESOLINE) 10 MG tablet Take 1 tablet (10 mg total) by mouth 2 (two) times daily. 11/23/13   Kristen N Henrickson, DO  levETIRAcetam (KEPPRA) 1000 MG tablet Take 1 tablet (1,000 mg total) by mouth 2 (two) times daily. 11/23/13   Kristen N Takagi, DO  lisinopril (PRINIVIL,ZESTRIL) 2.5 MG tablet Take 1 tablet (2.5 mg total) by mouth daily. 11/23/13   Kristen N Hutcherson, DO  meloxicam (MOBIC) 15 MG tablet Take 1 tablet (15 mg total) by mouth daily. Patient not taking: Reported on 01/12/2014 11/13/13   Alvina Chou, PA-C  modafinil (PROVIGIL) 100 MG tablet Take one tablet by mouth every morning Patient not taking: Reported on 11/23/2013 08/30/13   Tiffany L Reed, DO  Multiple Vitamins-Minerals (CENTAMIN) LIQD Take 5 mLs by mouth daily.    Historical Provider, MD  Omega-3 Fatty Acids (SM FISH OIL) 1000 MG CAPS  Take 6,000 mg by mouth daily.    Historical Provider, MD  oxyCODONE (ROXICODONE) 5 MG immediate release tablet Take one tablet by mouth twice daily for pain Patient not taking: Reported on 01/12/2014 10/01/13   Tiffany L Reed, DO  thiamine 100 MG tablet Place 1 tablet (100 mg total) into feeding tube daily. Patient taking differently: Take 100 mg by mouth daily.  07/23/13   Donita Brooks, NP   BP 123/76 mmHg  Pulse 91  Temp(Src) 98 F (36.7 C) (Oral)  Resp 18  SpO2 97% Physical Exam  Constitutional: He is oriented to person, place, and time. He appears well-developed and well-nourished. No distress.  HENT:  Head: Normocephalic and atraumatic.  Mouth/Throat: Oropharynx is clear and moist. No oropharyngeal exudate.  Eyes: Conjunctivae and EOM are normal. Pupils are equal, round, and reactive  to light. No scleral icterus.  Neck: Normal range of motion. Neck supple. No JVD present. No thyromegaly present.  Cardiovascular: Normal rate, regular rhythm, normal heart sounds and intact distal pulses.  Exam reveals no gallop and no friction rub.   No murmur heard. Pulmonary/Chest: Effort normal and breath sounds normal. No respiratory distress. He has no wheezes. He has no rales. He exhibits no tenderness.  Abdominal: Soft. Normal appearance and bowel sounds are normal. He exhibits no distension and no mass. There is generalized tenderness. There is guarding (voluntary). There is no rigidity, no rebound, no CVA tenderness, no tenderness at McBurney's point and negative Murphy's sign.  Musculoskeletal: Normal range of motion.  Lymphadenopathy:    He has no cervical adenopathy.  Neurological: He is alert and oriented to person, place, and time. He has normal strength. No cranial nerve deficit or sensory deficit.  Skin: Skin is warm and dry. He is not diaphoretic.  Psychiatric: He has a normal mood and affect. Judgment and thought content normal. His speech is slurred. He is slowed.  Nursing note and  vitals reviewed.   ED Course  Procedures (including critical care time) Labs Review Labs Reviewed  COMPREHENSIVE METABOLIC PANEL - Abnormal; Notable for the following:    Glucose, Bld 141 (*)    BUN <5 (*)    All other components within normal limits  LIPASE, BLOOD - Abnormal; Notable for the following:    Lipase 190 (*)    All other components within normal limits  CBC WITH DIFFERENTIAL  URINALYSIS, ROUTINE W REFLEX MICROSCOPIC  ETHANOL    Imaging Review Ct Abdomen Pelvis W Contrast  01/12/2014   CLINICAL DATA:  Initial evaluation for abdominal pain for 3 days, generalized abdominal pain  EXAM: CT ABDOMEN AND PELVIS WITH CONTRAST  TECHNIQUE: Multidetector CT imaging of the abdomen and pelvis was performed using the standard protocol following bolus administration of intravenous contrast.  CONTRAST:  128mL OMNIPAQUE IOHEXOL 300 MG/ML  SOLN  COMPARISON:  07/17/2013  FINDINGS: There is mild motion artifact through the upper abdomen on the initial series but not on of delayed images. There is mild inflammatory change around the pancreas with mild dilatation of the pancreatic duct.  Liver and gallbladder are normal. Spleen is normal adrenal glands and kidneys are normal.  Mild calcification of the aorta. Small bowel large bowel and appendix are normal. Stomach is normal.  No pseudocyst.  No abscess.  No ascites.  Bladder and reproductive organs are normal. Small fat containing inguinal hernias. No acute musculoskeletal findings.  IMPRESSION: Acute pancreatitis   Electronically Signed   By: Skipper Cliche M.D.   On: 01/12/2014 18:37   Dg Abd Acute W/chest  01/12/2014   CLINICAL DATA:  Abdominal pain, constipation  EXAM: ACUTE ABDOMEN SERIES (ABDOMEN 2 VIEW & CHEST 1 VIEW)  COMPARISON:  Chest radiographs dated 10/11/2013. CT abdomen pelvis dated 07/30/2013.  FINDINGS: Lungs are clear.  No pleural effusion or pneumothorax.  The heart is normal in size.  Nonobstructive bowel gas pattern.  No evidence  of free air under the diaphragm on the upright view.  Normal colonic stool burden.  Mild degenerative changes of the lumbar spine.  IMPRESSION: No evidence of acute cardiopulmonary disease.  No evidence of small bowel obstruction or free air.   Electronically Signed   By: Julian Hy M.D.   On: 01/12/2014 17:44     EKG Interpretation None      MDM   Final diagnoses:  Abdominal pain  Acute pancreatitis, unspecified pancreatitis type   Patient is a 49 year old male who presents emergency room for evaluation of generalized abdominal pain with onset several days ago. Patient is a poor historian. Physical exam reveals generalized tenderness to the abdomen with specific tenderness over the epigastric area. He is voluntarily guarding. He does give a clinical history of constipation. CBC and CMP are unremarkable at this time. Lipase is elevated at 190. Suspect that this may be an element of chronic pancreatitis secondary to alcohol abuse. We'll start a normal saline bolus and will give 100 mics of fentanyl for pain relief. We'll reevaluate.  Patient had some relief of pain after 100 g of fentanyl. CT of the abdomen and pelvis revealed acute pancreatitis with no other acute abnormalities. Acute abdomen and chest revealed no perforations of the abdomen or free air in the abdomen. Patient does appear to be constipated. I believe given history of alcohol use and new acute pancreatitis that the patient should be admitted to the hospital. Patient is refusing admission at this time and would like to leave [place]. I have told the patient that if he decides to leave that this could lead to further abdominal pain, or death. He states understanding and agreement at this time. I have advised the patient that he should return immediately if he develops worsening abdominal pain, hematemesis, or intractable nausea and vomiting. He states understanding and agreement at this time. Patient has the  mental competency to make his own decisions. He states understanding and agreement with leaving [place]. Patient to be discharged AMA.    Cherylann Parr, PA-C 01/12/14 1949  Johnna Acosta, MD 01/12/14 2231

## 2014-01-15 ENCOUNTER — Encounter (HOSPITAL_COMMUNITY): Payer: Self-pay | Admitting: Vascular Surgery

## 2014-01-15 ENCOUNTER — Emergency Department (HOSPITAL_COMMUNITY)
Admission: EM | Admit: 2014-01-15 | Discharge: 2014-01-15 | Disposition: A | Discharge status: Home / Self Care | Payer: Medicare Other | Source: Home / Self Care | Attending: Emergency Medicine | Admitting: Emergency Medicine

## 2014-01-15 ENCOUNTER — Emergency Department (HOSPITAL_COMMUNITY): Payer: Medicare Other

## 2014-01-15 DIAGNOSIS — Z888 Allergy status to other drugs, medicaments and biological substances status: Secondary | ICD-10-CM

## 2014-01-15 DIAGNOSIS — R569 Unspecified convulsions: Secondary | ICD-10-CM | POA: Diagnosis present

## 2014-01-15 DIAGNOSIS — K219 Gastro-esophageal reflux disease without esophagitis: Secondary | ICD-10-CM | POA: Diagnosis present

## 2014-01-15 DIAGNOSIS — E780 Pure hypercholesterolemia: Secondary | ICD-10-CM | POA: Diagnosis present

## 2014-01-15 DIAGNOSIS — J45909 Unspecified asthma, uncomplicated: Secondary | ICD-10-CM

## 2014-01-15 DIAGNOSIS — Z791 Long term (current) use of non-steroidal anti-inflammatories (NSAID): Secondary | ICD-10-CM | POA: Insufficient documentation

## 2014-01-15 DIAGNOSIS — F1010 Alcohol abuse, uncomplicated: Secondary | ICD-10-CM | POA: Diagnosis present

## 2014-01-15 DIAGNOSIS — F209 Schizophrenia, unspecified: Secondary | ICD-10-CM | POA: Insufficient documentation

## 2014-01-15 DIAGNOSIS — R1032 Left lower quadrant pain: Secondary | ICD-10-CM | POA: Insufficient documentation

## 2014-01-15 DIAGNOSIS — I10 Essential (primary) hypertension: Secondary | ICD-10-CM | POA: Diagnosis present

## 2014-01-15 DIAGNOSIS — Z8719 Personal history of other diseases of the digestive system: Secondary | ICD-10-CM | POA: Insufficient documentation

## 2014-01-15 DIAGNOSIS — Z7982 Long term (current) use of aspirin: Secondary | ICD-10-CM

## 2014-01-15 DIAGNOSIS — F1290 Cannabis use, unspecified, uncomplicated: Secondary | ICD-10-CM | POA: Diagnosis present

## 2014-01-15 DIAGNOSIS — Z7902 Long term (current) use of antithrombotics/antiplatelets: Secondary | ICD-10-CM | POA: Insufficient documentation

## 2014-01-15 DIAGNOSIS — Z79899 Other long term (current) drug therapy: Secondary | ICD-10-CM

## 2014-01-15 DIAGNOSIS — Z882 Allergy status to sulfonamides status: Secondary | ICD-10-CM

## 2014-01-15 DIAGNOSIS — Z720 Tobacco use: Secondary | ICD-10-CM | POA: Insufficient documentation

## 2014-01-15 DIAGNOSIS — K859 Acute pancreatitis without necrosis or infection, unspecified: Secondary | ICD-10-CM | POA: Diagnosis present

## 2014-01-15 DIAGNOSIS — I5032 Chronic diastolic (congestive) heart failure: Secondary | ICD-10-CM | POA: Diagnosis present

## 2014-01-15 DIAGNOSIS — F17210 Nicotine dependence, cigarettes, uncomplicated: Secondary | ICD-10-CM | POA: Diagnosis present

## 2014-01-15 DIAGNOSIS — R109 Unspecified abdominal pain: Secondary | ICD-10-CM | POA: Diagnosis not present

## 2014-01-15 DIAGNOSIS — E785 Hyperlipidemia, unspecified: Secondary | ICD-10-CM | POA: Diagnosis present

## 2014-01-15 DIAGNOSIS — G8929 Other chronic pain: Secondary | ICD-10-CM | POA: Insufficient documentation

## 2014-01-15 DIAGNOSIS — Z955 Presence of coronary angioplasty implant and graft: Secondary | ICD-10-CM

## 2014-01-15 DIAGNOSIS — F200 Paranoid schizophrenia: Secondary | ICD-10-CM | POA: Diagnosis present

## 2014-01-15 DIAGNOSIS — I2510 Atherosclerotic heart disease of native coronary artery without angina pectoris: Secondary | ICD-10-CM | POA: Diagnosis present

## 2014-01-15 DIAGNOSIS — I959 Hypotension, unspecified: Secondary | ICD-10-CM | POA: Diagnosis present

## 2014-01-15 LAB — URINALYSIS, ROUTINE W REFLEX MICROSCOPIC
BILIRUBIN URINE: NEGATIVE
Glucose, UA: NEGATIVE mg/dL
HGB URINE DIPSTICK: NEGATIVE
Ketones, ur: NEGATIVE mg/dL
LEUKOCYTES UA: NEGATIVE
NITRITE: NEGATIVE
PH: 7 (ref 5.0–8.0)
Protein, ur: NEGATIVE mg/dL
SPECIFIC GRAVITY, URINE: 1.008 (ref 1.005–1.030)
Urobilinogen, UA: 1 mg/dL (ref 0.0–1.0)

## 2014-01-15 LAB — CBC WITH DIFFERENTIAL/PLATELET
BASOS ABS: 0 10*3/uL (ref 0.0–0.1)
BASOS PCT: 0 % (ref 0–1)
Eosinophils Absolute: 0.3 10*3/uL (ref 0.0–0.7)
Eosinophils Relative: 4 % (ref 0–5)
HCT: 44.9 % (ref 39.0–52.0)
Hemoglobin: 15.8 g/dL (ref 13.0–17.0)
Lymphocytes Relative: 27 % (ref 12–46)
Lymphs Abs: 1.9 10*3/uL (ref 0.7–4.0)
MCH: 31.5 pg (ref 26.0–34.0)
MCHC: 35.2 g/dL (ref 30.0–36.0)
MCV: 89.4 fL (ref 78.0–100.0)
MONO ABS: 0.6 10*3/uL (ref 0.1–1.0)
Monocytes Relative: 8 % (ref 3–12)
NEUTROS ABS: 4.1 10*3/uL (ref 1.7–7.7)
Neutrophils Relative %: 61 % (ref 43–77)
Platelets: 199 10*3/uL (ref 150–400)
RBC: 5.02 MIL/uL (ref 4.22–5.81)
RDW: 14.3 % (ref 11.5–15.5)
WBC: 6.8 10*3/uL (ref 4.0–10.5)

## 2014-01-15 LAB — COMPREHENSIVE METABOLIC PANEL
ALBUMIN: 3.7 g/dL (ref 3.5–5.2)
ALK PHOS: 66 U/L (ref 39–117)
ALT: 10 U/L (ref 0–53)
ANION GAP: 9 (ref 5–15)
AST: 16 U/L (ref 0–37)
BILIRUBIN TOTAL: 0.8 mg/dL (ref 0.3–1.2)
BUN: 9 mg/dL (ref 6–23)
CHLORIDE: 103 meq/L (ref 96–112)
CO2: 26 mmol/L (ref 19–32)
Calcium: 9.1 mg/dL (ref 8.4–10.5)
Creatinine, Ser: 0.88 mg/dL (ref 0.50–1.35)
GFR calc Af Amer: >90 mL/min (ref >90)
GFR calc non Af Amer: >90 mL/min (ref >90)
Glucose, Bld: 134 mg/dL — ABNORMAL HIGH (ref 70–99)
POTASSIUM: 3.9 mmol/L (ref 3.5–5.1)
SODIUM: 138 mmol/L (ref 135–145)
TOTAL PROTEIN: 6.6 g/dL (ref 6.0–8.3)

## 2014-01-15 LAB — LIPASE, BLOOD: LIPASE: 127 U/L — AB (ref 11–59)

## 2014-01-15 MED ORDER — PROMETHAZINE HCL 25 MG PO TABS: 25.0000 mg | ORAL_TABLET | Freq: Four times a day (QID) | ORAL | Status: DC | PRN

## 2014-01-15 MED ORDER — SODIUM CHLORIDE 0.9 % IV BOLUS (SEPSIS)
1000.0000 mL | Freq: Once | INTRAVENOUS | Status: AC
  Administered 2014-01-15: 1000 mL via INTRAVENOUS

## 2014-01-15 MED ORDER — MORPHINE SULFATE 2 MG/ML IJ SOLN
2.0000 mg | Freq: Once | INTRAMUSCULAR | Status: AC
  Administered 2014-01-15: 2 mg via INTRAVENOUS
  Filled 2014-01-15: qty 1

## 2014-01-15 NOTE — ED Provider Notes (Signed)
CSN: [id number]     Arrival date & time 01/15/14  5176 History   First MD Initiated Contact with Patient 01/15/14 [phone]     Chief Complaint  Patient presents with  . Abdominal Pain     (Consider location/radiation/quality/duration/timing/severity/associated sxs/prior Treatment) Patient is a 49 y.o. male presenting with abdominal pain.  Abdominal Pain  .Marland Kitchen... Level V caveat for mental health issues. Patient complains of left lower quadrant pain since this morning. He was seen on 01/12/2013 with an elevated lipase and a diagnosis of acute pancreatitis. Patient refused admission. Patient has multiple health problems well documented in the past medical history. No nausea, vomiting, diarrhea. Severity is moderate.  Past Medical History  Diagnosis Date  . Hypertension   . Asthma   . GERD (gastroesophageal reflux disease)   . Chronic pain   . Schizophrenia   . Alcohol abuse   . Seizures    Past Surgical History  Procedure Laterality Date  . Left heart catheterization with coronary angiogram N/A 07/06/2013    Procedure: LEFT HEART CATHETERIZATION WITH CORONARY ANGIOGRAM;  Surgeon: Clent Demark, MD;  Location: [facility] CATH LAB;  Service: Cardiovascular;  Laterality: N/A;  . Percutaneous coronary stent intervention (pci-s)  07/06/2013    Procedure: PERCUTANEOUS CORONARY STENT INTERVENTION (PCI-S);  Surgeon: Clent Demark, MD;  Location: [facility] CATH LAB;  Service: Cardiovascular;;   Family History  Problem Relation Age of Onset  . Malignant hyperthermia Mother   . Cirrhosis Father   . Alcohol abuse Father    History  Substance Use Topics  . Smoking status: Current Every Day Smoker -- 1.00 packs/day    Types: Cigarettes  . Smokeless tobacco: Not on file  . Alcohol Use: 1.2 oz/week    2 Cans of beer per week     Comment: 1 400 oz per week    Review of Systems  Gastrointestinal: Positive for abdominal pain.  All other systems reviewed and are negative.     Allergies  Hctz; Hydroxyzine;  Sulfonamide derivatives; and Cetirizine & related  Home Medications   Prior to Admission medications   Medication Sig Start Date End Date Taking? Authorizing Provider  albuterol (PROVENTIL HFA;VENTOLIN HFA) 108 (90 BASE) MCG/ACT inhaler Inhale 1-2 puffs into the lungs every 4 (four) hours as needed for wheezing or shortness of breath. 11/23/13  Yes Kristen N Mcadams, DO  aspirin 81 MG chewable tablet Chew 1 tablet (81 mg total) by mouth daily. 11/23/13  Yes Kristen N Brannum, DO  atorvastatin (LIPITOR) 80 MG tablet Take 1 tablet (80 mg total) by mouth at bedtime. 11/23/13  Yes Kristen N Helming, DO  clopidogrel (PLAVIX) 75 MG tablet Take 1 tablet (75 mg total) by mouth daily. 11/23/13  Yes Kristen N Wilbert, DO  divalproex (DEPAKOTE) 500 MG DR tablet Take 1 tablet (500 mg total) by mouth 2 (two) times daily. 11/23/13  Yes Kristen N Luo, DO  famotidine (PEPCID) 20 MG tablet Take 1 tablet (20 mg total) by mouth 2 (two) times daily. 11/23/13  Yes Kristen N Gloster, DO  folic acid (FOLVITE) 1 MG tablet Place 1 tablet (1 mg total) into feeding tube daily. Patient taking differently: Take 1 mg by mouth daily.  07/23/13  Yes Donita Brooks, NP  furosemide (LASIX) 40 MG tablet Take 1 tablet (40 mg total) by mouth daily. 11/23/13  Yes Kristen N Cappello, DO  haloperidol decanoate (HALDOL DECANOATE) 50 MG/ML injection Inject 100 mg into the muscle every 28 (twenty-eight) days.   Yes  Historical Provider, MD  hydrALAZINE (APRESOLINE) 10 MG tablet Take 1 tablet (10 mg total) by mouth 2 (two) times daily. 11/23/13  Yes Kristen N Hemric, DO  levETIRAcetam (KEPPRA) 1000 MG tablet Take 1 tablet (1,000 mg total) by mouth 2 (two) times daily. 11/23/13  Yes Kristen N Alig, DO  lisinopril (PRINIVIL,ZESTRIL) 2.5 MG tablet Take 1 tablet (2.5 mg total) by mouth daily. 11/23/13  Yes Kristen N Rambert, DO  LORazepam (ATIVAN) 0.5 MG tablet Take 0.5 mg by mouth every 8 (eight) hours as needed for anxiety.   Yes Historical Provider, MD  metoprolol  tartrate (LOPRESSOR) 25 MG tablet Take 0.5 tablets (12.5 mg total) by mouth 2 (two) times daily. 11/23/13  Yes Kristen N Roesler, DO  Multiple Vitamins-Minerals (CENTAMIN) LIQD Take 5 mLs by mouth daily.   Yes Historical Provider, MD  Omega-3 Fatty Acids (SM FISH OIL) 1000 MG CAPS Take 6,000 mg by mouth daily.   Yes Historical Provider, MD  oxyCODONE (ROXICODONE) 5 MG immediate release tablet Take one tablet by mouth twice daily for pain 10/01/13  Yes Tiffany L Reed, DO  QUEtiapine (SEROQUEL) 50 MG tablet Take 1 tablet (50 mg total) by mouth at bedtime. 11/23/13  Yes Kristen N Brandenberger, DO  thiamine 100 MG tablet Place 1 tablet (100 mg total) into feeding tube daily. Patient taking differently: Take 100 mg by mouth daily.  07/23/13  Yes Donita Brooks, NP  zolpidem (AMBIEN) 5 MG tablet Take 1 tablet (5 mg total) by mouth at bedtime as needed for sleep. 08/30/13  Yes Tiffany L Reed, DO  busPIRone (BUSPAR) 5 MG tablet Take 5 mg by mouth 2 (two) times daily.    Historical Provider, MD  furosemide (LASIX) 10 MG/ML solution Place 4 mLs (40 mg total) into feeding tube daily. Patient not taking: Reported on 01/15/2014 07/23/13   Donita Brooks, NP  Melatonin 5 MG TABS Take 1 tablet by mouth at bedtime.    Historical Provider, MD  meloxicam (MOBIC) 15 MG tablet Take 1 tablet (15 mg total) by mouth daily. Patient not taking: Reported on 01/12/2014 11/13/13   Alvina Chou, PA-C  modafinil (PROVIGIL) 100 MG tablet Take one tablet by mouth every morning Patient not taking: Reported on 11/23/2013 08/30/13   Tiffany L Reed, DO   BP 119/90 mmHg  Pulse 82  Temp(Src) 97.5 F (36.4 C) (Oral)  Resp 17  SpO2 100% Physical Exam  Constitutional:  Answers questions slowly  HENT:  Head: Normocephalic and atraumatic.  Eyes: Conjunctivae and EOM are normal. Pupils are equal, round, and reactive to light.  Neck: Normal range of motion. Neck supple.  Cardiovascular: Normal rate and regular rhythm.   Pulmonary/Chest: Effort  normal and breath sounds normal.  Abdominal: Soft. Bowel sounds are normal.  Minimal left lower quadrant tenderness  Musculoskeletal: Normal range of motion.  Neurological: He is alert.  Skin: Skin is warm and dry.  Psychiatric:  Flat affect  Nursing note and vitals reviewed.   ED Course  Procedures (including critical care time) Labs Review Labs Reviewed  COMPREHENSIVE METABOLIC PANEL - Abnormal; Notable for the following:    Glucose, Bld 134 (*)    All other components within normal limits  LIPASE, BLOOD - Abnormal; Notable for the following:    Lipase 127 (*)    All other components within normal limits  CBC WITH DIFFERENTIAL  URINALYSIS, ROUTINE W REFLEX MICROSCOPIC    Imaging Review Dg Abd Acute W/chest  01/15/2014   CLINICAL DATA:  Mid to lower abdominal pain x3 days  EXAM: ACUTE ABDOMEN SERIES (ABDOMEN 2 VIEW & CHEST 1 VIEW)  COMPARISON:  CT abdomen pelvis 01/09/ 2016  FINDINGS: Lungs are clear.  No pleural effusion or pneumothorax.  The heart is normal in size.  Nonobstructive bowel gas pattern.  No evidence of free air under the diaphragm on the upright view.  Residual contrast in the transverse colon.  Visualized osseous structures are within normal limits.  IMPRESSION: No evidence of acute cardiopulmonary disease.  No evidence of small bowel obstruction or free air.   Electronically Signed   By: Julian Hy M.D.   On: 01/15/2014 11:21     EKG Interpretation None      MDM   Final diagnoses:  LLQ pain  Acute pancreatitis, unspecified pancreatitis type    Although lipase is elevated, it is significantly less than test on 01/12/2014. No acute abdomen. Patient does not want to stay in the hospital. Will discharge home.  Nat Christen, MD 01/15/14 1208

## 2014-01-15 NOTE — ED Notes (Addendum)
Pt reports to the ED for eval of generalized abd pain that began yesterday. Describes the as sharp, shooting, and stabbing pain. He has a hx of pancreatitis. Reports eating makes the pain better. Pt A&Ox 3. He is oriented to person, place, and situation but disoriented to time. Pt denies any urinary symptoms, N/V/D, fevers, or chills. Resp e/u and skin warm and dry.

## 2014-01-15 NOTE — Discharge Instructions (Signed)
Your pancreas tests are still slightly elevated, but not as high as the other day. Avoid alcohol. Medication for nausea.

## 2014-01-16 ENCOUNTER — Inpatient Hospital Stay (HOSPITAL_COMMUNITY)
Admission: EM | Admit: 2014-01-16 | Discharge: 2014-01-18 | DRG: 439 | Discharge status: Against Medical Advice | Payer: Medicare Other | Attending: Internal Medicine | Admitting: Internal Medicine

## 2014-01-16 ENCOUNTER — Encounter (HOSPITAL_COMMUNITY): Payer: Self-pay | Admitting: *Deleted

## 2014-01-16 DIAGNOSIS — F200 Paranoid schizophrenia: Secondary | ICD-10-CM | POA: Diagnosis present

## 2014-01-16 DIAGNOSIS — Z882 Allergy status to sulfonamides status: Secondary | ICD-10-CM | POA: Diagnosis not present

## 2014-01-16 DIAGNOSIS — R569 Unspecified convulsions: Secondary | ICD-10-CM | POA: Diagnosis present

## 2014-01-16 DIAGNOSIS — I2510 Atherosclerotic heart disease of native coronary artery without angina pectoris: Secondary | ICD-10-CM | POA: Diagnosis present

## 2014-01-16 DIAGNOSIS — R109 Unspecified abdominal pain: Secondary | ICD-10-CM | POA: Diagnosis present

## 2014-01-16 DIAGNOSIS — K859 Acute pancreatitis without necrosis or infection, unspecified: Secondary | ICD-10-CM | POA: Diagnosis present

## 2014-01-16 DIAGNOSIS — K219 Gastro-esophageal reflux disease without esophagitis: Secondary | ICD-10-CM | POA: Diagnosis present

## 2014-01-16 DIAGNOSIS — F1910 Other psychoactive substance abuse, uncomplicated: Secondary | ICD-10-CM | POA: Diagnosis present

## 2014-01-16 DIAGNOSIS — E785 Hyperlipidemia, unspecified: Secondary | ICD-10-CM | POA: Diagnosis present

## 2014-01-16 DIAGNOSIS — I10 Essential (primary) hypertension: Secondary | ICD-10-CM | POA: Diagnosis present

## 2014-01-16 DIAGNOSIS — I5032 Chronic diastolic (congestive) heart failure: Secondary | ICD-10-CM | POA: Diagnosis present

## 2014-01-16 DIAGNOSIS — E7800 Pure hypercholesterolemia, unspecified: Secondary | ICD-10-CM | POA: Diagnosis present

## 2014-01-16 DIAGNOSIS — F1010 Alcohol abuse, uncomplicated: Secondary | ICD-10-CM

## 2014-01-16 DIAGNOSIS — I959 Hypotension, unspecified: Secondary | ICD-10-CM | POA: Diagnosis present

## 2014-01-16 DIAGNOSIS — Z888 Allergy status to other drugs, medicaments and biological substances status: Secondary | ICD-10-CM | POA: Diagnosis not present

## 2014-01-16 DIAGNOSIS — F17210 Nicotine dependence, cigarettes, uncomplicated: Secondary | ICD-10-CM | POA: Diagnosis present

## 2014-01-16 DIAGNOSIS — Z720 Tobacco use: Secondary | ICD-10-CM | POA: Diagnosis present

## 2014-01-16 DIAGNOSIS — J45909 Unspecified asthma, uncomplicated: Secondary | ICD-10-CM | POA: Diagnosis present

## 2014-01-16 DIAGNOSIS — I509 Heart failure, unspecified: Secondary | ICD-10-CM

## 2014-01-16 DIAGNOSIS — Z7982 Long term (current) use of aspirin: Secondary | ICD-10-CM | POA: Diagnosis not present

## 2014-01-16 DIAGNOSIS — F1290 Cannabis use, unspecified, uncomplicated: Secondary | ICD-10-CM | POA: Diagnosis present

## 2014-01-16 DIAGNOSIS — I25810 Atherosclerosis of coronary artery bypass graft(s) without angina pectoris: Secondary | ICD-10-CM | POA: Diagnosis present

## 2014-01-16 DIAGNOSIS — Z955 Presence of coronary angioplasty implant and graft: Secondary | ICD-10-CM | POA: Diagnosis not present

## 2014-01-16 LAB — CBC WITH DIFFERENTIAL/PLATELET
Basophils Absolute: 0 10*3/uL (ref 0.0–0.1)
Basophils Relative: 0 % (ref 0–1)
EOS ABS: 0.2 10*3/uL (ref 0.0–0.7)
Eosinophils Relative: 3 % (ref 0–5)
HCT: 42.6 % (ref 39.0–52.0)
HEMOGLOBIN: 14.9 g/dL (ref 13.0–17.0)
Lymphocytes Relative: 29 % (ref 12–46)
Lymphs Abs: 2 10*3/uL (ref 0.7–4.0)
MCH: 30.5 pg (ref 26.0–34.0)
MCHC: 35 g/dL (ref 30.0–36.0)
MCV: 87.3 fL (ref 78.0–100.0)
Monocytes Absolute: 0.7 10*3/uL (ref 0.1–1.0)
Monocytes Relative: 10 % (ref 3–12)
Neutro Abs: 4.1 10*3/uL (ref 1.7–7.7)
Neutrophils Relative %: 58 % (ref 43–77)
Platelets: 217 10*3/uL (ref 150–400)
RBC: 4.88 MIL/uL (ref 4.22–5.81)
RDW: 14.2 % (ref 11.5–15.5)
WBC: 7 10*3/uL (ref 4.0–10.5)

## 2014-01-16 LAB — COMPREHENSIVE METABOLIC PANEL
ALT: 9 U/L (ref 0–53)
ANION GAP: 14 (ref 5–15)
AST: 12 U/L (ref 0–37)
Albumin: 3.7 g/dL (ref 3.5–5.2)
Alkaline Phosphatase: 62 U/L (ref 39–117)
BUN: 5 mg/dL — AB (ref 6–23)
CO2: 28 mmol/L (ref 19–32)
CREATININE: 0.89 mg/dL (ref 0.50–1.35)
Calcium: 9.6 mg/dL (ref 8.4–10.5)
Chloride: 96 mEq/L (ref 96–112)
GFR calc non Af Amer: >90 mL/min (ref >90)
GLUCOSE: 117 mg/dL — AB (ref 70–99)
POTASSIUM: 3.7 mmol/L (ref 3.5–5.1)
Sodium: 138 mmol/L (ref 135–145)
TOTAL PROTEIN: 6.5 g/dL (ref 6.0–8.3)
Total Bilirubin: 0.6 mg/dL (ref 0.3–1.2)

## 2014-01-16 LAB — RAPID URINE DRUG SCREEN, HOSP PERFORMED
Amphetamines: NOT DETECTED
Barbiturates: NOT DETECTED
Benzodiazepines: NOT DETECTED
Cocaine: NOT DETECTED
OPIATES: NOT DETECTED
TETRAHYDROCANNABINOL: NOT DETECTED

## 2014-01-16 LAB — VALPROIC ACID LEVEL: Valproic Acid Lvl: 51.1 ug/mL (ref 50.0–100.0)

## 2014-01-16 LAB — URINALYSIS, ROUTINE W REFLEX MICROSCOPIC
Bilirubin Urine: NEGATIVE
Glucose, UA: NEGATIVE mg/dL
Hgb urine dipstick: NEGATIVE
Ketones, ur: NEGATIVE mg/dL
LEUKOCYTES UA: NEGATIVE
Nitrite: NEGATIVE
PH: 7 (ref 5.0–8.0)
Protein, ur: NEGATIVE mg/dL
Specific Gravity, Urine: 1.003 — ABNORMAL LOW (ref 1.005–1.030)
Urobilinogen, UA: 1 mg/dL (ref 0.0–1.0)

## 2014-01-16 LAB — POC OCCULT BLOOD, ED: Fecal Occult Bld: NEGATIVE

## 2014-01-16 LAB — ETHANOL: Alcohol, Ethyl (B): <5 mg/dL (ref 0–9)

## 2014-01-16 LAB — LIPASE, BLOOD: Lipase: 210 U/L — ABNORMAL HIGH (ref 11–59)

## 2014-01-16 MED ORDER — BUSPIRONE HCL 5 MG PO TABS
5.0000 mg | ORAL_TABLET | Freq: Two times a day (BID) | ORAL | Status: DC
  Administered 2014-01-17 – 2014-01-18 (×3): 5 mg via ORAL
  Filled 2014-01-16 (×5): qty 1

## 2014-01-16 MED ORDER — HEPARIN SODIUM (PORCINE) 5000 UNIT/ML IJ SOLN
5000.0000 [IU] | Freq: Three times a day (TID) | INTRAMUSCULAR | Status: DC
  Administered 2014-01-17 – 2014-01-18 (×2): 5000 [IU] via SUBCUTANEOUS
  Filled 2014-01-16 (×6): qty 1

## 2014-01-16 MED ORDER — LORAZEPAM 2 MG/ML IJ SOLN
0.0000 mg | Freq: Two times a day (BID) | INTRAMUSCULAR | Status: DC
Start: 2014-01-19 — End: 2014-01-18

## 2014-01-16 MED ORDER — LORAZEPAM 2 MG/ML IJ SOLN
1.0000 mg | Freq: Four times a day (QID) | INTRAMUSCULAR | Status: DC | PRN
  Filled 2014-01-16 (×2): qty 1

## 2014-01-16 MED ORDER — LORAZEPAM 2 MG/ML IJ SOLN
0.0000 mg | Freq: Four times a day (QID) | INTRAMUSCULAR | Status: DC
Start: 2014-01-16 — End: 2014-01-18
  Administered 2014-01-17 (×2): 1 mg via INTRAVENOUS

## 2014-01-16 MED ORDER — LORAZEPAM 0.5 MG PO TABS
0.5000 mg | ORAL_TABLET | Freq: Three times a day (TID) | ORAL | Status: DC | PRN
Start: 2014-01-16 — End: 2014-01-18

## 2014-01-16 MED ORDER — HYDRALAZINE HCL 10 MG PO TABS
10.0000 mg | ORAL_TABLET | Freq: Two times a day (BID) | ORAL | Status: DC
  Filled 2014-01-16 (×5): qty 1

## 2014-01-16 MED ORDER — MELATONIN 3 MG PO TABS
6.0000 mg | ORAL_TABLET | Freq: Every day | ORAL | Status: DC
  Administered 2014-01-17: 6 mg via ORAL
  Filled 2014-01-16 (×3): qty 2

## 2014-01-16 MED ORDER — SODIUM CHLORIDE 0.9 % IV BOLUS (SEPSIS)
1000.0000 mL | Freq: Once | INTRAVENOUS | Status: AC
  Administered 2014-01-16: 1000 mL via INTRAVENOUS

## 2014-01-16 MED ORDER — METOPROLOL TARTRATE 12.5 MG HALF TABLET
12.5000 mg | ORAL_TABLET | Freq: Two times a day (BID) | ORAL | Status: DC
  Filled 2014-01-16 (×5): qty 1

## 2014-01-16 MED ORDER — ASPIRIN 81 MG PO CHEW
81.0000 mg | CHEWABLE_TABLET | Freq: Every day | ORAL | Status: DC
  Administered 2014-01-17 – 2014-01-18 (×2): 81 mg via ORAL
  Filled 2014-01-16 (×2): qty 1

## 2014-01-16 MED ORDER — VITAMIN B-1 100 MG PO TABS
100.0000 mg | ORAL_TABLET | Freq: Every day | ORAL | Status: DC
  Administered 2014-01-17 – 2014-01-18 (×2): 100 mg via ORAL
  Filled 2014-01-16 (×2): qty 1

## 2014-01-16 MED ORDER — DIVALPROEX SODIUM 500 MG PO DR TAB
500.0000 mg | DELAYED_RELEASE_TABLET | Freq: Two times a day (BID) | ORAL | Status: DC
  Administered 2014-01-17 – 2014-01-18 (×3): 500 mg via ORAL
  Filled 2014-01-16 (×5): qty 1

## 2014-01-16 MED ORDER — SODIUM CHLORIDE 0.9 % IV SOLN
INTRAVENOUS | Status: DC
  Administered 2014-01-17 (×4): via INTRAVENOUS

## 2014-01-16 MED ORDER — HYDRALAZINE HCL 20 MG/ML IJ SOLN: 5.0000 mg | INTRAMUSCULAR | Status: DC | PRN

## 2014-01-16 MED ORDER — ADULT MULTIVITAMIN W/MINERALS CH
1.0000 | ORAL_TABLET | Freq: Every day | ORAL | Status: DC
  Administered 2014-01-17 – 2014-01-18 (×2): 1 via ORAL
  Filled 2014-01-16 (×2): qty 1

## 2014-01-16 MED ORDER — FOLIC ACID 1 MG PO TABS
1.0000 mg | ORAL_TABLET | Freq: Every day | ORAL | Status: DC
Start: 2014-01-17 — End: 2014-01-18
  Administered 2014-01-17 – 2014-01-18 (×2): 1 mg via ORAL
  Filled 2014-01-16 (×2): qty 1

## 2014-01-16 MED ORDER — ONDANSETRON HCL 4 MG/2ML IJ SOLN: 4.0000 mg | Freq: Three times a day (TID) | INTRAMUSCULAR | Status: DC | PRN

## 2014-01-16 MED ORDER — VITAMIN B-1 100 MG PO TABS: 100.0000 mg | ORAL_TABLET | Freq: Every day | ORAL | Status: DC

## 2014-01-16 MED ORDER — FAMOTIDINE 20 MG PO TABS
20.0000 mg | ORAL_TABLET | Freq: Two times a day (BID) | ORAL | Status: DC
  Administered 2014-01-17 – 2014-01-18 (×3): 20 mg via ORAL
  Filled 2014-01-16 (×5): qty 1

## 2014-01-16 MED ORDER — HALOPERIDOL DECANOATE 50 MG/ML IM SOLN: 100.0000 mg | INTRAMUSCULAR | Status: DC

## 2014-01-16 MED ORDER — CLOPIDOGREL BISULFATE 75 MG PO TABS
75.0000 mg | ORAL_TABLET | Freq: Every day | ORAL | Status: DC
  Administered 2014-01-17 – 2014-01-18 (×2): 75 mg via ORAL
  Filled 2014-01-16 (×2): qty 1

## 2014-01-16 MED ORDER — HYDROMORPHONE HCL 1 MG/ML IJ SOLN
1.0000 mg | INTRAMUSCULAR | Status: DC | PRN
  Administered 2014-01-16: 1 mg via INTRAVENOUS
  Filled 2014-01-16: qty 1

## 2014-01-16 MED ORDER — LEVETIRACETAM 500 MG PO TABS
1000.0000 mg | ORAL_TABLET | Freq: Two times a day (BID) | ORAL | Status: DC
  Administered 2014-01-17 – 2014-01-18 (×3): 1000 mg via ORAL
  Filled 2014-01-16 (×5): qty 2

## 2014-01-16 MED ORDER — ATORVASTATIN CALCIUM 80 MG PO TABS
80.0000 mg | ORAL_TABLET | Freq: Every day | ORAL | Status: DC
  Administered 2014-01-17: 80 mg via ORAL
  Filled 2014-01-16 (×3): qty 1

## 2014-01-16 MED ORDER — FOLIC ACID 1 MG PO TABS: 1.0000 mg | ORAL_TABLET | Freq: Every day | ORAL | Status: DC

## 2014-01-16 MED ORDER — ALBUTEROL SULFATE (2.5 MG/3ML) 0.083% IN NEBU: 2.5000 mg | INHALATION_SOLUTION | RESPIRATORY_TRACT | Status: DC | PRN

## 2014-01-16 MED ORDER — MODAFINIL 100 MG PO TABS
100.0000 mg | ORAL_TABLET | Freq: Every day | ORAL | Status: DC
  Administered 2014-01-17: 100 mg via ORAL
  Filled 2014-01-16: qty 1

## 2014-01-16 MED ORDER — OXYCODONE HCL 5 MG PO TABS
10.0000 mg | ORAL_TABLET | ORAL | Status: DC | PRN
  Administered 2014-01-17: 10 mg via ORAL
  Filled 2014-01-16: qty 2

## 2014-01-16 MED ORDER — OMEGA-3-ACID ETHYL ESTERS 1 G PO CAPS
3000.0000 mg | ORAL_CAPSULE | Freq: Every day | ORAL | Status: DC
  Administered 2014-01-17 – 2014-01-18 (×2): 3000 mg via ORAL
  Filled 2014-01-16 (×2): qty 3

## 2014-01-16 MED ORDER — ADULT MULTIVITAMIN W/MINERALS CH: 1.0000 | ORAL_TABLET | Freq: Every day | ORAL | Status: DC

## 2014-01-16 MED ORDER — NICOTINE 21 MG/24HR TD PT24
21.0000 mg | MEDICATED_PATCH | Freq: Every day | TRANSDERMAL | Status: DC
  Administered 2014-01-17 – 2014-01-18 (×3): 21 mg via TRANSDERMAL
  Filled 2014-01-16 (×3): qty 1

## 2014-01-16 MED ORDER — LORAZEPAM 1 MG PO TABS: 1.0000 mg | ORAL_TABLET | Freq: Four times a day (QID) | ORAL | Status: DC | PRN

## 2014-01-16 MED ORDER — ZOLPIDEM TARTRATE 5 MG PO TABS: 5.0000 mg | ORAL_TABLET | Freq: Every evening | ORAL | Status: DC | PRN

## 2014-01-16 MED ORDER — QUETIAPINE FUMARATE 50 MG PO TABS
50.0000 mg | ORAL_TABLET | Freq: Every day | ORAL | Status: DC
  Administered 2014-01-17: 50 mg via ORAL
  Filled 2014-01-16 (×3): qty 1

## 2014-01-16 MED ORDER — LISINOPRIL 2.5 MG PO TABS
2.5000 mg | ORAL_TABLET | Freq: Every day | ORAL | Status: DC
  Filled 2014-01-16 (×2): qty 1

## 2014-01-16 MED ORDER — THIAMINE HCL 100 MG/ML IJ SOLN
100.0000 mg | Freq: Every day | INTRAMUSCULAR | Status: DC
  Filled 2014-01-16 (×2): qty 1

## 2014-01-16 NOTE — ED Notes (Signed)
Attempted report 

## 2014-01-16 NOTE — ED Notes (Signed)
Pt reports mid abd pain that's been going on for extended amount of time, reports recently having blood in stools. Denies n/v. Was seen here yesterday for same.

## 2014-01-16 NOTE — ED Notes (Signed)
[place], PA at bedside.

## 2014-01-16 NOTE — H&P (Signed)
[facility] History and Physical  Manuelito Poage [place]lemyre QIW:[id number] DOB: 1965-03-17 DOA: 01/16/2014  Referring physician: ED physician PCP: [facility]  Specialists:   Chief Complaint: Abdominal pain  HPI: Chase Blackwell is a 49 y.o. male with past medical history of hypertension, asthma, GERD, history of alcohol abuse, seizure, schizophrenia, coronary artery disease (post status of stent placement per patient), who presents with abdominal pain.  Patient reports that he has intermittent abdominal pain in past 3 months, which has worsened over the past couple of days. Patient reports that the pain is localized to the center of the abdomen. It is constant pain. It is aggravated by eating food. Patient reported that yesterday he noticed bright red blood in his stools. This is the patient's third time visiting the hospital regarding his abdominal pain. Each time patient has been suggested to be admitted, but patient refused. Patient was seen in ED setting on 01/12/14 and was diagnosed with acute pancreatitis. He was recommended to be admitted to the hospital, and continued to decline admission. Patient used to drink alcohol in the past, but reports that he has not been drinking recently. Patient has nausea, but no vomiting or diarrhea. No fever or chills. He has mild cough due to smoking, but no shortness breath or chest pain.  Work up in the ED demonstrates elevated lipase 201. No leukocytosis. FOBT negative. X-ray of abdomen/chest is negative for acute abnormalities. Urinalysis is negative. Patient is admitted to inpatient for further evaluation and treatment.  Review of Systems: As presented in the history of presenting illness, rest negative.  Where does patient live?  At home with his brother Can patient participate in ADLs? some  Allergy:  Allergies  Allergen Reactions  . Hctz [Hydrochlorothiazide] Other (See Comments)    Dizzy spells  . Hydroxyzine Hives  . Sulfonamide Derivatives  Hives  . Cetirizine & Related Other (See Comments)    unknown    Past Medical History  Diagnosis Date  . Hypertension   . Asthma   . GERD (gastroesophageal reflux disease)   . Chronic pain   . Schizophrenia   . Alcohol abuse   . Seizures     Past Surgical History  Procedure Laterality Date  . Left heart catheterization with coronary angiogram N/A 07/06/2013    Procedure: LEFT HEART CATHETERIZATION WITH CORONARY ANGIOGRAM;  Surgeon: Clent Demark, MD;  Location[facility] CATH LAB;  Service: Cardiovascular;  Laterality: N/A;  . Percutaneous coronary stent intervention (pci-s)  07/06/2013    Procedure: PERCUTANEOUS CORONARY STENT INTERVENTION (PCI-S);  Surgeon: Clent Demark, MD;  Location: [facility] CATH LAB;  Service: Cardiovascular;;    Social History:  reports that he has been smoking Cigarettes.  He has been smoking about 1.00 pack per day. He does not have any smokeless tobacco history on file. He reports that he drinks about 1.2 oz of alcohol per week. He reports that he uses illicit drugs (Marijuana).  Family History:  Family History  Problem Relation Age of Onset  . Malignant hyperthermia Mother   . Cirrhosis Father   . Alcohol abuse Father      Prior to Admission medications   Medication Sig Start Date End Date Taking? Authorizing Provider  albuterol (PROVENTIL HFA;VENTOLIN HFA) 108 (90 BASE) MCG/ACT inhaler Inhale 1-2 puffs into the lungs every 4 (four) hours as needed for wheezing or shortness of breath. 11/23/13  Yes Kristen N Sutphen, DO  aspirin 81 MG chewable tablet Chew 1 tablet (81 mg total) by  mouth daily. 11/23/13  Yes Kristen N Marmo, DO  atorvastatin (LIPITOR) 80 MG tablet Take 1 tablet (80 mg total) by mouth at bedtime. 11/23/13  Yes Kristen N Modisette, DO  busPIRone (BUSPAR) 5 MG tablet Take 5 mg by mouth 2 (two) times daily.   Yes Historical Provider, MD  clopidogrel (PLAVIX) 75 MG tablet Take 1 tablet (75 mg total) by mouth daily. 11/23/13  Yes Kristen N Zarcone, DO   divalproex (DEPAKOTE) 500 MG DR tablet Take 1 tablet (500 mg total) by mouth 2 (two) times daily. 11/23/13  Yes Kristen N Casserly, DO  famotidine (PEPCID) 20 MG tablet Take 1 tablet (20 mg total) by mouth 2 (two) times daily. 11/23/13  Yes Kristen N Chilson, DO  folic acid (FOLVITE) 1 MG tablet Place 1 tablet (1 mg total) into feeding tube daily. Patient taking differently: Take 1 mg by mouth daily.  07/23/13  Yes Donita Brooks, NP  furosemide (LASIX) 10 MG/ML solution Place 4 mLs (40 mg total) into feeding tube daily. 07/23/13  Yes Donita Brooks, NP  furosemide (LASIX) 40 MG tablet Take 1 tablet (40 mg total) by mouth daily. 11/23/13  Yes Kristen N Dejarnette, DO  haloperidol decanoate (HALDOL DECANOATE) 50 MG/ML injection Inject 100 mg into the muscle every 28 (twenty-eight) days.   Yes Historical Provider, MD  hydrALAZINE (APRESOLINE) 10 MG tablet Take 1 tablet (10 mg total) by mouth 2 (two) times daily. 11/23/13  Yes Kristen N Ashlock, DO  levETIRAcetam (KEPPRA) 1000 MG tablet Take 1 tablet (1,000 mg total) by mouth 2 (two) times daily. 11/23/13  Yes Kristen N Feasel, DO  lisinopril (PRINIVIL,ZESTRIL) 2.5 MG tablet Take 1 tablet (2.5 mg total) by mouth daily. 11/23/13  Yes Kristen N Sprung, DO  LORazepam (ATIVAN) 0.5 MG tablet Take 0.5 mg by mouth every 8 (eight) hours as needed for anxiety.   Yes Historical Provider, MD  Melatonin 5 MG TABS Take 1 tablet by mouth at bedtime.   Yes Historical Provider, MD  meloxicam (MOBIC) 15 MG tablet Take 1 tablet (15 mg total) by mouth daily. 11/13/13  Yes Kaitlyn Szekalski, PA-C  metoprolol tartrate (LOPRESSOR) 25 MG tablet Take 0.5 tablets (12.5 mg total) by mouth 2 (two) times daily. 11/23/13  Yes Kristen N Haros, DO  modafinil (PROVIGIL) 100 MG tablet Take one tablet by mouth every morning 08/30/13  Yes Tiffany L Reed, DO  Multiple Vitamins-Minerals (CENTAMIN) LIQD Take 5 mLs by mouth daily.   Yes Historical Provider, MD  Omega-3 Fatty Acids (SM FISH OIL) 1000 MG CAPS Take  6,000 mg by mouth daily.   Yes Historical Provider, MD  oxyCODONE (ROXICODONE) 5 MG immediate release tablet Take one tablet by mouth twice daily for pain 10/01/13  Yes Tiffany L Reed, DO  promethazine (PHENERGAN) 25 MG tablet Take 1 tablet (25 mg total) by mouth every 6 (six) hours as needed for nausea. 01/15/14  Yes Nat Christen, MD  QUEtiapine (SEROQUEL) 50 MG tablet Take 1 tablet (50 mg total) by mouth at bedtime. 11/23/13  Yes Kristen N Stith, DO  thiamine 100 MG tablet Place 1 tablet (100 mg total) into feeding tube daily. Patient taking differently: Take 100 mg by mouth daily.  07/23/13  Yes Donita Brooks, NP  zolpidem (AMBIEN) 5 MG tablet Take 1 tablet (5 mg total) by mouth at bedtime as needed for sleep. 08/30/13  Yes Gayland Curry, DO    Physical Exam: Filed Vitals:   01/16/14 2245 01/16/14 2315 01/16/14  2359 01/17/14 0500  BP: 156/86 155/80 125/78 103/68  Pulse: 66 68 80 72  Temp:   98.5 F (36.9 C) 97.9 F (36.6 C)  TempSrc:   Oral Axillary  Resp:  18 16 18   Weight:   72.3 kg (159 lb 6.3 oz)   SpO2: 100% 100% 96% 100%   General: Not in acute distress HEENT:       Eyes: PERRL, EOMI, no scleral icterus       ENT: No discharge from the ears and nose, no pharynx injection, no tonsillar enlargement.        Neck: No JVD, no bruit, no mass felt. Cardiac: S1/S2, RRR, No murmurs, No gallops or rubs Pulm: Good air movement bilaterally. Clear to auscultation bilaterally. No rales, wheezing, rhonchi or rubs. Abd: Soft, nondistended, tenderness over lower abdomen, no rebound pain, no organomegaly, BS present Ext: No edema bilaterally. 2+DP/PT pulse bilaterally Musculoskeletal: No joint deformities, erythema, or stiffness, ROM full Skin: No rashes.  Neuro: Alert and oriented X3, cranial nerves II-XII grossly intact, muscle strength 5/5 in all extremeties, sensation to light touch intact.  Psych: Patient is not psychotic, no suicidal or hemocidal ideation.  Labs on Admission:  Basic  Metabolic Panel:  Recent Labs Lab 01/12/14 1429 01/15/14 1015 01/16/14 2008 01/17/14 0340  NA 139 138 138 144  K 3.5 3.9 3.7 3.8  CL 105 103 96 111  CO2 26 26 28 26   GLUCOSE 141* 134* 117* 114*  BUN <5* 9 5* <5*  CREATININE 0.93 0.88 0.89 0.75  CALCIUM 9.2 9.1 9.6 8.9   Liver Function Tests:  Recent Labs Lab 01/12/14 1429 01/15/14 1015 01/16/14 2008 01/17/14 0340  AST 14 16 12 10   ALT 9 10 9 7   ALKPHOS 65 66 62 57  BILITOT 0.6 0.8 0.6 0.5  PROT 6.8 6.6 6.5 5.6*  ALBUMIN 3.8 3.7 3.7 3.1*    Recent Labs Lab 01/12/14 1429 01/15/14 1015 01/16/14 2008  LIPASE 190* 127* 210*   No results for input(s): AMMONIA in the last 168 hours. CBC:  Recent Labs Lab 01/12/14 1429 01/15/14 1015 01/16/14 2008 01/17/14 0340  WBC 5.6 6.8 7.0 6.4  NEUTROABS 2.8 4.1 4.1  --   HGB 15.6 15.8 14.9 13.8  HCT 44.4 44.9 42.6 40.3  MCV 89.0 89.4 87.3 89.4  PLT 190 199 217 193   Cardiac Enzymes: No results for input(s): CKTOTAL, CKMB, CKMBINDEX, TROPONINI in the last 168 hours.  BNP (last 3 results)  Recent Labs  02/01/13 1221 06/27/13 1933  PROBNP 192.7* 146.2*   CBG: No results for input(s): GLUCAP in the last 168 hours.  Radiological Exams on Admission: Dg Abd Acute W/chest  01/15/2014   CLINICAL DATA:  Mid to lower abdominal pain x3 days  EXAM: ACUTE ABDOMEN SERIES (ABDOMEN 2 VIEW & CHEST 1 VIEW)  COMPARISON:  CT abdomen pelvis 01/09/ 2016  FINDINGS: Lungs are clear.  No pleural effusion or pneumothorax.  The heart is normal in size.  Nonobstructive bowel gas pattern.  No evidence of free air under the diaphragm on the upright view.  Residual contrast in the transverse colon.  Visualized osseous structures are within normal limits.  IMPRESSION: No evidence of acute cardiopulmonary disease.  No evidence of small bowel obstruction or free air.   Electronically Signed   By: Julian Hy M.D.   On: 01/15/2014 11:21    EKG: Independently reviewed.    Assessment/Plan Principal Problem:   Acute pancreatitis Active Problems:   HYPERCHOLESTEROLEMIA  PARANOID SCHIZOPHRENIA, CHRONIC   Alcohol abuse   Multiple substance abuse   Essential hypertension   Seizures   CAD (coronary artery disease)   CHF (congestive heart failure)   Tobacco abuse   CAD (coronary artery disease) of artery bypass graft  Acute pancreatitis: Lipase 210. This may be related to alcohol drinking, however patient denies drinking alcohol recently. His alcohol level  is negative on admission. He had ct-abd on 01/12/14 which showed acute pancreatitis, but no gallstone. -admit to med-surg bed -NPO for pancreatitis -IVF at 100 cc/hr -IV morphine for pain control, IV zofran for nausea -check lipid profile to rule out hypertriglyceridemia.  CAD: s/p of stent. No chest pain. -Continue aspirin, Lipitor, Plavix,  Schizophrenia: Stable, no suicidal homicidal ideations. -Continue home medications  Hypertension:  -Continue hydralazine, metoprolol, lisinopril -Hold the Lasix, since patient needs IV fluid  Congestive heart failure: 2-D echo on 08/23/13 showed EF 55-60% with grade 1 diastolic dysfunction. Patient is euvolemic on admission. -Hold Lasix.  Seizure: Valproic acid level is 51.1 which is good. Last seizure was a year ago per patient. -Continue home medications: Keppra and Depakote  Hyperlipidemia: -Continue Lipitor  Alcohol abuse: -CIWA protocol  Tobacco Abuse: Patient smokes 3 pack a day for 10 years. -did counseling about the importance of quitting smoking. -Nicotine patch   DVT ppx: SQ Heparin   Code Status: Full code Family Communication: None at bed side.      Disposition Plan: Admit to inpatient   Date of Service 01/17/2014    Ivor Costa [facility] Pager [phone]  If 7PM-7AM, please contact night-coverage [email or web address] Password [email or web address] 01/17/2014, 7:14 AM

## 2014-01-16 NOTE — ED Notes (Signed)
PA at bedside.

## 2014-01-16 NOTE — Progress Notes (Signed)
New Admission Note:   Arrival Method: via stretcher from ED Mental Orientation: Alert and Oriented x2 Telemetry: N/A Assessment: Completed Skin: Intact, warm, and dry IV: Right AC NS @ 100cc Pain: C/o Abdominal pain 4/10 Tubes: N/A Safety Measures: Educated on fall prevention safety plan, patient acknowledged and understood. Admission: Completed [address] Orientation: Patient has been orientated to the room, unit and staff.  Family: N/A  Orders have been reviewed and implemented. Will continue to monitor the patient. Call light has been placed within reach and bed alarm has been activated.    Dorothea Glassman, RN  Phone number: [phone]

## 2014-01-16 NOTE — ED Provider Notes (Signed)
CSN: [id number]     Arrival date & time 01/16/14  1726 History   First MD Initiated Contact with Patient 01/16/14 2004     Chief Complaint  Patient presents with  . Abdominal Pain     (Consider location/radiation/quality/duration/timing/severity/associated sxs/prior Treatment) The history is provided by the patient. No language interpreter was used.  Chase Blackwell is a 49 year old male with past medical history of hypertension, asthma, GERD, chronic pain, schizophrenia, alcohol abuse, seizures, on plavix secondary to STEMI presenting to the emergency department with abdominal pain that is been ongoing for approximately 3 months. Patient reports that it has worsened over the past couple of days. Patient reports that the pain is localized to the center of the abdomen described as a constant pain that is worse with eating. Patient reported that yesterday he noticed bright red blood in his stools-denied any prior blood in his stools today. Reported that he lives with his brother. Stated that he's been taking his medication as prescribed. This will be the patient's third time visiting the hospital regarding this and tinnitus complaint-each time patient has been suggested to be admitted, patient refuses. Patient was seen in ED setting on generate ninth 2016 regarding abdominal pain, diagnosed with acute pancreatitis-patient was recommended to be admitted to the hospital, continued to decline admission. Denied alcohol, heroin, cocaine, marijuana use. Reports he did associate cigarettes. Denied nausea, vomiting, diarrhea, back pain, neck pain, urinary symptoms, hematuria, chest pain, short of breath, difficulty breathing, headache, dizziness. [facility]  Past Medical History  Diagnosis Date  . Hypertension   . Asthma   . GERD (gastroesophageal reflux disease)   . Chronic pain   . Schizophrenia   . Alcohol abuse   . Seizures    Past Surgical History  Procedure Laterality Date  . Left heart  catheterization with coronary angiogram N/A 07/06/2013    Procedure: LEFT HEART CATHETERIZATION WITH CORONARY ANGIOGRAM;  Surgeon: Clent Demark, MD;  Location: [facility] CATH LAB;  Service: Cardiovascular;  Laterality: N/A;  . Percutaneous coronary stent intervention (pci-s)  07/06/2013    Procedure: PERCUTANEOUS CORONARY STENT INTERVENTION (PCI-S);  Surgeon: Clent Demark, MD;  Location: [facility] CATH LAB;  Service: Cardiovascular;;   Family History  Problem Relation Age of Onset  . Malignant hyperthermia Mother   . Cirrhosis Father   . Alcohol abuse Father    History  Substance Use Topics  . Smoking status: Current Every Day Smoker -- 1.00 packs/day    Types: Cigarettes  . Smokeless tobacco: Not on file  . Alcohol Use: 1.2 oz/week    2 Cans of beer per week     Comment: 1 400 oz per week    Review of Systems  Constitutional: Negative for fever and chills.  Respiratory: Negative for chest tightness and shortness of breath.   Cardiovascular: Negative for chest pain.  Gastrointestinal: Positive for abdominal pain and blood in stool. Negative for vomiting, diarrhea and constipation.  Musculoskeletal: Negative for back pain, neck pain and neck stiffness.      Allergies  Hctz; Hydroxyzine; Sulfonamide derivatives; and Cetirizine & related  Home Medications   Prior to Admission medications   Medication Sig Start Date End Date Taking? Authorizing Provider  albuterol (PROVENTIL HFA;VENTOLIN HFA) 108 (90 BASE) MCG/ACT inhaler Inhale 1-2 puffs into the lungs every 4 (four) hours as needed for wheezing or shortness of breath. 11/23/13  Yes Kristen N Villacres, DO  aspirin 81 MG chewable tablet Chew 1 tablet (81 mg total) by  mouth daily. 11/23/13  Yes Kristen N Johannesen, DO  atorvastatin (LIPITOR) 80 MG tablet Take 1 tablet (80 mg total) by mouth at bedtime. 11/23/13  Yes Kristen N Rezendes, DO  busPIRone (BUSPAR) 5 MG tablet Take 5 mg by mouth 2 (two) times daily.   Yes Historical Provider, MD  clopidogrel  (PLAVIX) 75 MG tablet Take 1 tablet (75 mg total) by mouth daily. 11/23/13  Yes Kristen N Bonawitz, DO  divalproex (DEPAKOTE) 500 MG DR tablet Take 1 tablet (500 mg total) by mouth 2 (two) times daily. 11/23/13  Yes Kristen N Flegel, DO  famotidine (PEPCID) 20 MG tablet Take 1 tablet (20 mg total) by mouth 2 (two) times daily. 11/23/13  Yes Kristen N Petronio, DO  folic acid (FOLVITE) 1 MG tablet Place 1 tablet (1 mg total) into feeding tube daily. Patient taking differently: Take 1 mg by mouth daily.  07/23/13  Yes Donita Brooks, NP  furosemide (LASIX) 10 MG/ML solution Place 4 mLs (40 mg total) into feeding tube daily. 07/23/13  Yes Donita Brooks, NP  furosemide (LASIX) 40 MG tablet Take 1 tablet (40 mg total) by mouth daily. 11/23/13  Yes Kristen N Cudney, DO  haloperidol decanoate (HALDOL DECANOATE) 50 MG/ML injection Inject 100 mg into the muscle every 28 (twenty-eight) days.   Yes Historical Provider, MD  hydrALAZINE (APRESOLINE) 10 MG tablet Take 1 tablet (10 mg total) by mouth 2 (two) times daily. 11/23/13  Yes Kristen N Yarbough, DO  levETIRAcetam (KEPPRA) 1000 MG tablet Take 1 tablet (1,000 mg total) by mouth 2 (two) times daily. 11/23/13  Yes Kristen N Gundry, DO  lisinopril (PRINIVIL,ZESTRIL) 2.5 MG tablet Take 1 tablet (2.5 mg total) by mouth daily. 11/23/13  Yes Kristen N Arcilla, DO  LORazepam (ATIVAN) 0.5 MG tablet Take 0.5 mg by mouth every 8 (eight) hours as needed for anxiety.   Yes Historical Provider, MD  Melatonin 5 MG TABS Take 1 tablet by mouth at bedtime.   Yes Historical Provider, MD  meloxicam (MOBIC) 15 MG tablet Take 1 tablet (15 mg total) by mouth daily. 11/13/13  Yes Kaitlyn Szekalski, PA-C  metoprolol tartrate (LOPRESSOR) 25 MG tablet Take 0.5 tablets (12.5 mg total) by mouth 2 (two) times daily. 11/23/13  Yes Kristen N Stoffers, DO  modafinil (PROVIGIL) 100 MG tablet Take one tablet by mouth every morning 08/30/13  Yes Tiffany L Reed, DO  Multiple Vitamins-Minerals (CENTAMIN) LIQD Take 5 mLs  by mouth daily.   Yes Historical Provider, MD  Omega-3 Fatty Acids (SM FISH OIL) 1000 MG CAPS Take 6,000 mg by mouth daily.   Yes Historical Provider, MD  oxyCODONE (ROXICODONE) 5 MG immediate release tablet Take one tablet by mouth twice daily for pain 10/01/13  Yes Tiffany L Reed, DO  promethazine (PHENERGAN) 25 MG tablet Take 1 tablet (25 mg total) by mouth every 6 (six) hours as needed for nausea. 01/15/14  Yes Nat Christen, MD  QUEtiapine (SEROQUEL) 50 MG tablet Take 1 tablet (50 mg total) by mouth at bedtime. 11/23/13  Yes Kristen N Clubb, DO  thiamine 100 MG tablet Place 1 tablet (100 mg total) into feeding tube daily. Patient taking differently: Take 100 mg by mouth daily.  07/23/13  Yes Donita Brooks, NP  zolpidem (AMBIEN) 5 MG tablet Take 1 tablet (5 mg total) by mouth at bedtime as needed for sleep. 08/30/13  Yes Tiffany L Reed, DO   BP 131/78 mmHg  Pulse 70  Temp(Src) 98 F (36.7 C) (  Oral)  Resp 12  SpO2 100% Physical Exam  Constitutional: He is oriented to person, place, and time. He appears well-developed and well-nourished.  HENT:  Head: Normocephalic and atraumatic.  Eyes: Conjunctivae and EOM are normal. Pupils are equal, round, and reactive to light. Right eye exhibits no discharge. Left eye exhibits no discharge.  Dilated pupils bilaterally  Neck: Normal range of motion. Neck supple. No tracheal deviation present.  Cardiovascular: Normal rate, regular rhythm and normal heart sounds.  Exam reveals no friction rub.   No murmur heard. Pulmonary/Chest: Effort normal and breath sounds normal. No respiratory distress. He has no wheezes. He has no rales.  Abdominal: Soft. Bowel sounds are normal. He exhibits no distension. There is generalized tenderness. There is no rebound and no guarding.  Negative abdominal distention Bowel sounds normoactive in all 4 quadrants Abdomen soft upon palpation Exquisite tenderness upon palpation to the abdomen  Genitourinary:  Rectal exam:  Negative swelling, erythema, inflammation, lesions, sores, deformities, hemorrhoids identified anus. Negative palpation of internal hemorrhoids. Brown stools noted on glove. Negative bright red blood per rectum. Strong sphincter tone. Exam chaperoned with nurse, Vicente Males  Musculoskeletal: Normal range of motion.  Lymphadenopathy:    He has no cervical adenopathy.  Neurological: He is alert and oriented to person, place, and time. No cranial nerve deficit. He exhibits normal muscle tone. Coordination normal.  Slow to respond-responds properly Patient follows commands well Negative facial droop Negative slurred speech Negative aphasia  Skin: Skin is warm and dry. No rash noted. No erythema.  Psychiatric: He has a normal mood and affect. His behavior is normal. Thought content normal.  Nursing note and vitals reviewed.   ED Course  Procedures (including critical care time)  Results for orders placed or performed during the hospital encounter of 01/16/14  CBC with Differential  Result Value Ref Range   WBC 7.0 4.0 - 10.5 K/uL   RBC 4.88 4.22 - 5.81 MIL/uL   Hemoglobin 14.9 13.0 - 17.0 g/dL   HCT 42.6 39.0 - 52.0 %   MCV 87.3 78.0 - 100.0 fL   MCH 30.5 26.0 - 34.0 pg   MCHC 35.0 30.0 - 36.0 g/dL   RDW 14.2 11.5 - 15.5 %   Platelets 217 150 - 400 K/uL   Neutrophils Relative % 58 43 - 77 %   Neutro Abs 4.1 1.7 - 7.7 K/uL   Lymphocytes Relative 29 12 - 46 %   Lymphs Abs 2.0 0.7 - 4.0 K/uL   Monocytes Relative 10 3 - 12 %   Monocytes Absolute 0.7 0.1 - 1.0 K/uL   Eosinophils Relative 3 0 - 5 %   Eosinophils Absolute 0.2 0.0 - 0.7 K/uL   Basophils Relative 0 0 - 1 %   Basophils Absolute 0.0 0.0 - 0.1 K/uL  Comprehensive metabolic panel  Result Value Ref Range   Sodium 138 135 - 145 mmol/L   Potassium 3.7 3.5 - 5.1 mmol/L   Chloride 96 96 - 112 mEq/L   CO2 28 19 - 32 mmol/L   Glucose, Bld 117 (H) 70 - 99 mg/dL   BUN 5 (L) 6 - 23 mg/dL   Creatinine, Ser 0.89 0.50 - 1.35 mg/dL    Calcium 9.6 8.4 - 10.5 mg/dL   Total Protein 6.5 6.0 - 8.3 g/dL   Albumin 3.7 3.5 - 5.2 g/dL   AST 12 0 - 37 U/L   ALT 9 0 - 53 U/L   Alkaline Phosphatase 62 39 - 117 U/L  Total Bilirubin 0.6 0.3 - 1.2 mg/dL   GFR calc non Af Amer >90 >90 mL/min   GFR calc Af Amer >90 >90 mL/min   Anion gap 14 5 - 15  Lipase, blood  Result Value Ref Range   Lipase 210 (H) 11 - 59 U/L  Urinalysis, Routine w reflex microscopic  Result Value Ref Range   Color, Urine YELLOW YELLOW   APPearance CLEAR CLEAR   Specific Gravity, Urine 1.003 (L) 1.005 - 1.030   pH 7.0 5.0 - 8.0   Glucose, UA NEGATIVE NEGATIVE mg/dL   Hgb urine dipstick NEGATIVE NEGATIVE   Bilirubin Urine NEGATIVE NEGATIVE   Ketones, ur NEGATIVE NEGATIVE mg/dL   Protein, ur NEGATIVE NEGATIVE mg/dL   Urobilinogen, UA 1.0 0.0 - 1.0 mg/dL   Nitrite NEGATIVE NEGATIVE   Leukocytes, UA NEGATIVE NEGATIVE  Ethanol  Result Value Ref Range   Alcohol, Ethyl (B) <5 0 - 9 mg/dL  Urine rapid drug screen (hosp performed)  Result Value Ref Range   Opiates NONE DETECTED NONE DETECTED   Cocaine NONE DETECTED NONE DETECTED   Benzodiazepines NONE DETECTED NONE DETECTED   Amphetamines NONE DETECTED NONE DETECTED   Tetrahydrocannabinol NONE DETECTED NONE DETECTED   Barbiturates NONE DETECTED NONE DETECTED  Valproic acid level  Result Value Ref Range   Valproic Acid Lvl 51.1 50.0 - 100.0 ug/mL  POC occult blood, ED  Result Value Ref Range   Fecal Occult Bld NEGATIVE NEGATIVE    Labs Review Labs Reviewed  COMPREHENSIVE METABOLIC PANEL - Abnormal; Notable for the following:    Glucose, Bld 117 (*)    BUN 5 (*)    All other components within normal limits  LIPASE, BLOOD - Abnormal; Notable for the following:    Lipase 210 (*)    All other components within normal limits  URINALYSIS, ROUTINE W REFLEX MICROSCOPIC - Abnormal; Notable for the following:    Specific Gravity, Urine 1.003 (*)    All other components within normal limits  CBC WITH  DIFFERENTIAL  ETHANOL  URINE RAPID DRUG SCREEN (HOSP PERFORMED)  VALPROIC ACID LEVEL  POC OCCULT BLOOD, ED    Imaging Review Dg Abd Acute W/chest  01/15/2014   CLINICAL DATA:  Mid to lower abdominal pain x3 days  EXAM: ACUTE ABDOMEN SERIES (ABDOMEN 2 VIEW & CHEST 1 VIEW)  COMPARISON:  CT abdomen pelvis 01/09/ 2016  FINDINGS: Lungs are clear.  No pleural effusion or pneumothorax.  The heart is normal in size.  Nonobstructive bowel gas pattern.  No evidence of free air under the diaphragm on the upright view.  Residual contrast in the transverse colon.  Visualized osseous structures are within normal limits.  IMPRESSION: No evidence of acute cardiopulmonary disease.  No evidence of small bowel obstruction or free air.   Electronically Signed   By: Julian Hy M.D.   On: 01/15/2014 11:21     EKG Interpretation None       10:38 PM This provider spoke with Dr. Donna Bernard, [facility] - discussed case, labs, imaging, ED course in great detail. Patient to be admitted for acute pancreatitis. Admitted to [place].   MDM   Final diagnoses:  Acute pancreatitis, unspecified pancreatitis type  Alcohol abuse    Medications  sodium chloride 0.9 % bolus 1,000 mL (1,000 mLs Intravenous New Bag/Given 01/16/14 2209)    Filed Vitals:   01/16/14 2045 01/16/14 2115 01/16/14 2116 01/16/14 2130  BP: 133/90 139/83 139/83 131/78  Pulse: 69 77 76 70  Temp:      TempSrc:      Resp:   12   SpO2: 100% 100% 100% 100%   This provider reviewed the patient's chart. Patient was seen and assessed in ED setting on 01/12/2014 regarding abdominal pain-at this time patient had an elevated lipase of 190 and CT abdomen and pelvis with contrast identified acute pancreatitis. At this time admission was recommended-patient refused and left AMA. Patient presented to the ED yesterday, 01/15/2014 regarding increased abdominal pain worsening symptoms-lipase was mildly elevated at 127-patient did not want to stay in the  hospital patient was discharged home. CBC negative elevated leukocytosis. Hemoglobin 14.9, hematocrit 42.6. CMP unremarkable. Glucose 117, negative elevated anion gap-14.0 mg/L. Ethanol negative elevation. Lipase elevated at 210 - has increased when compared to approximate 4 days ago-highest that it is been. Valproic acid negative elevation-51.1-doubt valproic toxicity. Fecal occult negative. Urine drug screen negative. Urinalysis negative for hemoglobin, nitrites, leukocytes. Patient presenting to the ED with acute pancreatitis. Lipase has elevated when compared to previous labs. Patient continues to have abdominal pain that is poorly controlled with decreased appetite and decreased eating secondary to worsening pain. This is the patient's third visit to the emergency department regarding same complaint. Discussed with patient and he needs to be admitted regarding his acute pancreatitis for IV fluids and monitoring-patient agreed to plan of care. Patient stable, afebrile. Patient not septic appearing. Patient to be admitted to hospital for acute pancreatitis-admitted to [place] floor under the care of [facility]. Patient stable for transfer.  Jamse Mead, PA-C 01/16/14 [id number]  Shaune Pollack, MD 01/17/14 [id number]

## 2014-01-17 DIAGNOSIS — I509 Heart failure, unspecified: Secondary | ICD-10-CM

## 2014-01-17 DIAGNOSIS — Z720 Tobacco use: Secondary | ICD-10-CM

## 2014-01-17 DIAGNOSIS — I25810 Atherosclerosis of coronary artery bypass graft(s) without angina pectoris: Secondary | ICD-10-CM | POA: Diagnosis present

## 2014-01-17 DIAGNOSIS — I5032 Chronic diastolic (congestive) heart failure: Secondary | ICD-10-CM

## 2014-01-17 DIAGNOSIS — E780 Pure hypercholesterolemia: Secondary | ICD-10-CM

## 2014-01-17 LAB — COMPREHENSIVE METABOLIC PANEL
ALT: 7 U/L (ref 0–53)
AST: 10 U/L (ref 0–37)
Albumin: 3.1 g/dL — ABNORMAL LOW (ref 3.5–5.2)
Alkaline Phosphatase: 57 U/L (ref 39–117)
Anion gap: 7 (ref 5–15)
BILIRUBIN TOTAL: 0.5 mg/dL (ref 0.3–1.2)
BUN: <5 mg/dL — ABNORMAL LOW (ref 6–23)
CHLORIDE: 111 meq/L (ref 96–112)
CO2: 26 mmol/L (ref 19–32)
Calcium: 8.9 mg/dL (ref 8.4–10.5)
Creatinine, Ser: 0.75 mg/dL (ref 0.50–1.35)
GFR calc non Af Amer: >90 mL/min (ref >90)
Glucose, Bld: 114 mg/dL — ABNORMAL HIGH (ref 70–99)
Potassium: 3.8 mmol/L (ref 3.5–5.1)
Sodium: 144 mmol/L (ref 135–145)
Total Protein: 5.6 g/dL — ABNORMAL LOW (ref 6.0–8.3)

## 2014-01-17 LAB — PROTIME-INR
INR: 1.03 (ref 0.00–1.49)
PROTHROMBIN TIME: 13.6 s (ref 11.6–15.2)

## 2014-01-17 LAB — LIPID PANEL
Cholesterol: 106 mg/dL (ref 0–200)
HDL: 34 mg/dL — ABNORMAL LOW (ref >39)
LDL Cholesterol: 55 mg/dL (ref 0–99)
TRIGLYCERIDES: 85 mg/dL (ref <150)
Total CHOL/HDL Ratio: 3.1 RATIO
VLDL: 17 mg/dL (ref 0–40)

## 2014-01-17 LAB — GLUCOSE, CAPILLARY: Glucose-Capillary: 87 mg/dL (ref 70–99)

## 2014-01-17 LAB — CBC
HEMATOCRIT: 40.3 % (ref 39.0–52.0)
HEMOGLOBIN: 13.8 g/dL (ref 13.0–17.0)
MCH: 30.6 pg (ref 26.0–34.0)
MCHC: 34.2 g/dL (ref 30.0–36.0)
MCV: 89.4 fL (ref 78.0–100.0)
PLATELETS: 193 10*3/uL (ref 150–400)
RBC: 4.51 MIL/uL (ref 4.22–5.81)
RDW: 14.3 % (ref 11.5–15.5)
WBC: 6.4 10*3/uL (ref 4.0–10.5)

## 2014-01-17 NOTE — Progress Notes (Signed)
[facility]                                                                              Patient Demographics  Chase Blackwell, is a 49 y.o. male, DOB - Dec 24, 1965, EXH:[id number]  Admit date - 01/16/2014   Admitting Physician Ivor Costa, MD  Outpatient Primary MD for the patient is [facility]  LOS - 1   Chief Complaint  Patient presents with  . Abdominal Pain      HPI on 01/16/2014 by Dr. Justus Memory Hurd is a 49 y.o. male with past medical history of hypertension, asthma, GERD, history of alcohol abuse, seizure, schizophrenia, coronary artery disease (post status of stent placement per patient), who presents with abdominal pain. Patient reports that he has intermittent abdominal pain in past 3 months, which has worsened over the past couple of days. Patient reports that the pain is localized to the center of the abdomen. It is constant pain. It is aggravated by eating food. Patient reported that yesterday he noticed bright red blood in his stools. This is the patient's third time visiting the hospital regarding his abdominal pain. Each time patient has been suggested to be admitted, but patient refused. Patient was seen in ED setting on 01/12/14 and was diagnosed with acute pancreatitis. He was recommended to be admitted to the hospital, and continued to decline admission. Patient used to drink alcohol in the past, but reports that he has not been drinking recently. Patient has nausea, but no vomiting or diarrhea. No fever or chills. He has mild cough due to smoking, but no shortness breath or chest pain. Work up in the ED demonstrates elevated lipase 201. No leukocytosis. FOBT negative. X-ray of abdomen/chest is negative for acute abnormalities. Urinalysis is negative. Patient is admitted to inpatient for further evaluation and treatment.   Assessment & Plan   Acute pancreatitis -Lipase 210 -Possible related to alcohol (however negative on admission) -CT abdomen on 01/12/2014  showed acute pancreatitis (patient presented to the ED, but left AMA on 01/12/2014, he then presented again the ED on 01/15/2014 for abdominal pain and did not want to be admitted -Continue IVF, pain control, and antiemetics, NPO -Triglycerides 85  CAD -s/p of stent. No chest pain. -Continue aspirin, Lipitor, Plavix,  Schizophrenia  -Stable, no suicidal homicidal ideations. -Continue home medications  Hypertension -Continue hydralazine, metoprolol, lisinopri -Hold the Lasix, since patient needs IV fluid  Chronic Diastolic congestive heart failure -Echo on 08/23/13 showed EF 55-60% with grade 1 diastolic dysfunction.  -Patient is euvolemic on admission. -Lasix held  Seizure -Valproic acid level is 51.1 -Last seizure was a year ago per patient. -Continue home medications: Keppra and Depakote  Hyperlipidemia -Continue Lipitor  Alcohol abuse -CIWA protocol  Tobacco Abuse -Patient smokes 3 pack a day for 10 years -Patient counseled  -Nicotine patch  Code Status: full  Family Communication: none at bedside  Disposition Plan: Admitted  Time Spent in minutes   30 minutes  Procedures  None  Consults   None  DVT Prophylaxis  Heparin  Lab Results  Component Value Date   PLT 193 01/17/2014    Medications  Scheduled Meds: . aspirin  81 mg Oral Daily  . atorvastatin  80 mg Oral QHS  . busPIRone  5 mg Oral BID  . clopidogrel  75 mg Oral Daily  . divalproex  500 mg Oral BID  . famotidine  20 mg Oral BID  . folic acid  1 mg Oral Daily  . heparin  5,000 Units Subcutaneous 3 times per day  . hydrALAZINE  10 mg Oral BID  . levETIRAcetam  1,000 mg Oral BID  . lisinopril  2.5 mg Oral Daily  . LORazepam  0-4 mg Intravenous Q6H   Followed by  . [START ON 01/19/2014] LORazepam  0-4 mg Intravenous Q12H  . Melatonin  6 mg Oral QHS  . metoprolol tartrate  12.5 mg Oral BID  . modafinil  100 mg Oral Q breakfast  . multivitamin with minerals  1 tablet Oral Daily  . nicotine   21 mg Transdermal Daily  . omega-3 acid ethyl esters  3,000 mg Oral Daily  . QUEtiapine  50 mg Oral QHS  . thiamine  100 mg Oral Daily   Or  . thiamine  100 mg Intravenous Daily   Continuous Infusions: . sodium chloride 100 mL/hr at 01/17/14 1046   PRN Meds:.albuterol, hydrALAZINE, HYDROmorphone (DILAUDID) injection, LORazepam **OR** LORazepam, LORazepam, ondansetron, oxyCODONE, zolpidem  Antibiotics    Anti-infectives    None        Subjective:   Chase Blackwell seen and examined today.  Patient currently sleepy.  Does not complain of abdominal pain at this time.  Objective:   Filed Vitals:   01/16/14 2359 01/17/14 0500 01/17/14 1038 01/17/14 1051  BP: 125/78 103/68 100/59   Pulse: 80 72 68 68  Temp: 98.5 F (36.9 C) 97.9 F (36.6 C)    TempSrc: Oral Axillary    Resp: 16 18 18    Weight: 72.3 kg (159 lb 6.3 oz)     SpO2: 96% 100% 100%     Wt Readings from Last 3 Encounters:  01/16/14 72.3 kg (159 lb 6.3 oz)  02/01/13 83.462 kg (184 lb)  07/31/08 107.162 kg (236 lb 4 oz)     Intake/Output Summary (Last 24 hours) at 01/17/14 1324 Last data filed at 01/17/14 1051  Gross per 24 hour  Intake      0 ml  Output   1480 ml  Net  -1480 ml    Exam  General: Well developed, well nourished, NAD, appears stated age  HEENT: NCAT, mucous membranes moist.   Cardiovascular: S1 S2 auscultated, no rubs, murmurs or gallops. Regular rate and rhythm.  Respiratory: Clear to auscultation bilaterally with equal chest rise  Abdomen: Soft, mildtender TTP, nondistended, + bowel sounds  Extremities: warm dry without cyanosis clubbing or edema  Neuro: AAOx3, nonfocal  Data Review   Micro Results No results found for this or any previous visit (from the past 240 hour(s)).  Radiology Reports Ct Abdomen Pelvis W Contrast  01/12/2014   CLINICAL DATA:  Initial evaluation for abdominal pain for 3 days, generalized abdominal pain  EXAM: CT ABDOMEN AND PELVIS WITH CONTRAST   TECHNIQUE: Multidetector CT imaging of the abdomen and pelvis was performed using the standard protocol following bolus administration of intravenous contrast.  CONTRAST:  148mL OMNIPAQUE IOHEXOL 300 MG/ML  SOLN  COMPARISON:  07/17/2013  FINDINGS: There is mild motion artifact through the upper abdomen on the initial series but not on of delayed images. There is mild inflammatory change around the pancreas with mild dilatation of the pancreatic duct.  Liver and gallbladder are normal. Spleen is normal adrenal glands and kidneys are normal.  Mild calcification of the aorta. Small bowel large bowel and appendix are normal. Stomach is normal.  No pseudocyst.  No abscess.  No ascites.  Bladder and reproductive organs are normal. Small fat containing inguinal hernias. No acute musculoskeletal findings.  IMPRESSION: Acute pancreatitis   Electronically Signed   By: Skipper Cliche M.D.   On: 01/12/2014 18:37   Dg Abd Acute W/chest  01/15/2014   CLINICAL DATA:  Mid to lower abdominal pain x3 days  EXAM: ACUTE ABDOMEN SERIES (ABDOMEN 2 VIEW & CHEST 1 VIEW)  COMPARISON:  CT abdomen pelvis 01/09/ 2016  FINDINGS: Lungs are clear.  No pleural effusion or pneumothorax.  The heart is normal in size.  Nonobstructive bowel gas pattern.  No evidence of free air under the diaphragm on the upright view.  Residual contrast in the transverse colon.  Visualized osseous structures are within normal limits.  IMPRESSION: No evidence of acute cardiopulmonary disease.  No evidence of small bowel obstruction or free air.   Electronically Signed   By: Julian Hy M.D.   On: 01/15/2014 11:21   Dg Abd Acute W/chest  01/12/2014   CLINICAL DATA:  Abdominal pain, constipation  EXAM: ACUTE ABDOMEN SERIES (ABDOMEN 2 VIEW & CHEST 1 VIEW)  COMPARISON:  Chest radiographs dated 10/11/2013. CT abdomen pelvis dated 07/23/2013.  FINDINGS: Lungs are clear.  No pleural effusion or pneumothorax.  The heart is normal in size.  Nonobstructive bowel gas  pattern.  No evidence of free air under the diaphragm on the upright view.  Normal colonic stool burden.  Mild degenerative changes of the lumbar spine.  IMPRESSION: No evidence of acute cardiopulmonary disease.  No evidence of small bowel obstruction or free air.   Electronically Signed   By: Julian Hy M.D.   On: 01/12/2014 17:44    CBC  Recent Labs Lab 01/12/14 1429 01/15/14 1015 01/16/14 2008 01/17/14 0340  WBC 5.6 6.8 7.0 6.4  HGB 15.6 15.8 14.9 13.8  HCT 44.4 44.9 42.6 40.3  PLT 190 199 217 193  MCV 89.0 89.4 87.3 89.4  MCH 31.3 31.5 30.5 30.6  MCHC 35.1 35.2 35.0 34.2  RDW 14.1 14.3 14.2 14.3  LYMPHSABS 2.3 1.9 2.0  --   MONOABS 0.4 0.6 0.7  --   EOSABS 0.2 0.3 0.2  --   BASOSABS 0.0 0.0 0.0  --     Chemistries   Recent Labs Lab 01/12/14 1429 01/15/14 1015 01/16/14 2008 01/17/14 0340  NA 139 138 138 144  K 3.5 3.9 3.7 3.8  CL 105 103 96 111  CO2 26 26 28 26   GLUCOSE 141* 134* 117* 114*  BUN <5* 9 5* <5*  CREATININE 0.93 0.88 0.89 0.75  CALCIUM 9.2 9.1 9.6 8.9  AST 14 16 12 10   ALT 9 10 9 7   ALKPHOS 65 66 62 57  BILITOT 0.6 0.8 0.6 0.5   ------------------------------------------------------------------------------------------------------------------ CrCl cannot be calculated (Unknown ideal weight.). ------------------------------------------------------------------------------------------------------------------ No results for input(s): HGBA1C in the last 72 hours. ------------------------------------------------------------------------------------------------------------------  Recent Labs  01/17/14 0340  CHOL 106  HDL 34*  LDLCALC 55  TRIG 85  CHOLHDL 3.1   ------------------------------------------------------------------------------------------------------------------ No results for input(s): TSH, T4TOTAL, T3FREE, THYROIDAB in the last 72 hours.  Invalid input(s):  FREET3 ------------------------------------------------------------------------------------------------------------------ No results for input(s): VITAMINB12, FOLATE, FERRITIN, TIBC, IRON, RETICCTPCT in the last 72 hours.  Coagulation profile  Recent Labs Lab 01/17/14 0340  INR 1.03    No results for input(s): DDIMER in the last 72 hours.  Cardiac Enzymes No results for input(s): CKMB, TROPONINI, MYOGLOBIN in the last 168 hours.  Invalid input(s): CK ------------------------------------------------------------------------------------------------------------------ Invalid input(s): POCBNP    Victoire Deans D.O. on 01/17/2014 at 1:24 PM  Between 7am to 7pm - Pager - [phone]  After 7pm go to [email or web address] - password [email or web address]  And look for the night coverage person covering for me after hours  [facility] Office  [phone]

## 2014-01-18 LAB — LIPASE, BLOOD: Lipase: 58 U/L (ref 11–59)

## 2014-01-18 LAB — GLUCOSE, CAPILLARY: Glucose-Capillary: 70 mg/dL (ref 70–99)

## 2014-01-18 MED ORDER — HYDROMORPHONE HCL 1 MG/ML IJ SOLN
0.5000 mg | INTRAMUSCULAR | Status: DC | PRN
  Filled 2014-01-18: qty 1

## 2014-01-18 NOTE — Discharge Summary (Signed)
Physician Discharge Summary  Bryar Dahms Schlechter NTI:[id number] DOB: March 02, 1965 DOA: 01/16/2014  PCP: [facility]  Admit date: 01/16/2014 Discharge date: 01/18/2014  Time spent: 30 minutes  Recommendations for Outpatient Follow-up:  None, patient left AGAINST MEDICAL ADVICE  Discharge Diagnoses:  Acute pancreatitis CAD Schizophrenia Hypotension Chronic diastolic congestive heart failure Seizure Hyperlipidemia Alcohol abuse Tobacco abuse  Discharge Condition: None  Diet recommendation: None  Filed Weights   01/16/14 2359 01/18/14 0006  Weight: 72.3 kg (159 lb 6.3 oz) 73.4 kg (161 lb 13.1 oz)    History of present illness:  on 01/16/2014 by Dr. Justus Memory Reggio is a 49 y.o. male with past medical history of hypertension, asthma, GERD, history of alcohol abuse, seizure, schizophrenia, coronary artery disease (post status of stent placement per patient), who presents with abdominal pain. Patient reports that he has intermittent abdominal pain in past 3 months, which has worsened over the past couple of days. Patient reports that the pain is localized to the center of the abdomen. It is constant pain. It is aggravated by eating food. Patient reported that yesterday he noticed bright red blood in his stools. This is the patient's third time visiting the hospital regarding his abdominal pain. Each time patient has been suggested to be admitted, but patient refused. Patient was seen in ED setting on 01/12/14 and was diagnosed with acute pancreatitis. He was recommended to be admitted to the hospital, and continued to decline admission. Patient used to drink alcohol in the past, but reports that he has not been drinking recently. Patient has nausea, but no vomiting or diarrhea. No fever or chills. He has mild cough due to smoking, but no shortness breath or chest pain. Work up in the ED demonstrates elevated lipase 201. No leukocytosis. FOBT negative. X-ray of abdomen/chest is negative  for acute abnormalities. Urinalysis is negative. Patient is admitted to inpatient for further evaluation and treatment.  Hospital Course:  Acute pancreatitis -Lipase 210, repeated 58 -Possible related to alcohol (however negative on admission) -CT abdomen on 01/12/2014 showed acute pancreatitis (patient presented to the ED, but left AMA on 01/12/2014, he then presented again the ED on 01/15/2014 for abdominal pain and did not want to be admitted -Continue IVF, pain control, and antiemetics -Triglycerides 85 -Patient continued to complain of abdominal pain and pain medications -He opted to leave [place], patient did understand the risks of doing this.  CAD -s/p of stent. No chest pain. -Continue aspirin, Lipitor, Plavix  Schizophrenia  -Stable, no suicidal homicidal ideations. -Continue home medications  Hypotension  -Possibly related to pain medications -Home medications hydralazine, metoprolol, lisinopril (with holding parameters), Lasix held  Chronic Diastolic congestive heart failure -Echo on 08/23/13 showed EF 55-60% with grade 1 diastolic dysfunction.  -Patient is euvolemic on admission. -Lasix held  Seizure -Valproic acid level is 51.1 -Last seizure was a year ago per patient. -Continue home medications: Keppra and Depakote  Hyperlipidemia -Continue Lipitor  Alcohol abuse -Was placed on CIWA protocol  Tobacco Abuse -Patient smokes 3 pack a day for 10 years -Patient counseled  -Nicotine patch  Procedures:  None  Consultations:  None  Discharge Exam: Filed Vitals:   01/18/14 1034  BP: 86/73  Pulse: 51  Temp:   Resp:    Exam  General: Well developed, well nourished, NAD, appears stated age  HEENT: NCAT, mucous membranes moist.   Cardiovascular: S1 S2 auscultated, no rubs, murmurs or gallops. Regular rate and rhythm.  Respiratory: Clear to auscultation  bilaterally with equal chest rise  Abdomen: Soft, mildtender TTP, nondistended,  + bowel sounds  Extremities: warm dry without cyanosis clubbing or edema  Neuro: AAOx3, nonfocal  Discharge Instructions     Medication List    ASK your doctor about these medications        albuterol 108 (90 BASE) MCG/ACT inhaler  Commonly known as:  PROVENTIL HFA;VENTOLIN HFA  Inhale 1-2 puffs into the lungs every 4 (four) hours as needed for wheezing or shortness of breath.     aspirin 81 MG chewable tablet  Chew 1 tablet (81 mg total) by mouth daily.     atorvastatin 80 MG tablet  Commonly known as:  LIPITOR  Take 1 tablet (80 mg total) by mouth at bedtime.     busPIRone 5 MG tablet  Commonly known as:  BUSPAR  Take 5 mg by mouth 2 (two) times daily.     CENTAMIN Liqd  Take 5 mLs by mouth daily.     clopidogrel 75 MG tablet  Commonly known as:  PLAVIX  Take 1 tablet (75 mg total) by mouth daily.     divalproex 500 MG DR tablet  Commonly known as:  DEPAKOTE  Take 1 tablet (500 mg total) by mouth 2 (two) times daily.     famotidine 20 MG tablet  Commonly known as:  PEPCID  Take 1 tablet (20 mg total) by mouth 2 (two) times daily.     folic acid 1 MG tablet  Commonly known as:  FOLVITE  Place 1 tablet (1 mg total) into feeding tube daily.     furosemide 10 MG/ML solution  Commonly known as:  LASIX  Place 4 mLs (40 mg total) into feeding tube daily.     furosemide 40 MG tablet  Commonly known as:  LASIX  Take 1 tablet (40 mg total) by mouth daily.     haloperidol decanoate 50 MG/ML injection  Commonly known as:  HALDOL DECANOATE  Inject 100 mg into the muscle every 28 (twenty-eight) days.     hydrALAZINE 10 MG tablet  Commonly known as:  APRESOLINE  Take 1 tablet (10 mg total) by mouth 2 (two) times daily.     levETIRAcetam 1000 MG tablet  Commonly known as:  KEPPRA  Take 1 tablet (1,000 mg total) by mouth 2 (two) times daily.     lisinopril 2.5 MG tablet  Commonly known as:  PRINIVIL,ZESTRIL  Take 1 tablet (2.5 mg total) by mouth daily.      LORazepam 0.5 MG tablet  Commonly known as:  ATIVAN  Take 0.5 mg by mouth every 8 (eight) hours as needed for anxiety.     Melatonin 5 MG Tabs  Take 1 tablet by mouth at bedtime.     meloxicam 15 MG tablet  Commonly known as:  MOBIC  Take 1 tablet (15 mg total) by mouth daily.     metoprolol tartrate 25 MG tablet  Commonly known as:  LOPRESSOR  Take 0.5 tablets (12.5 mg total) by mouth 2 (two) times daily.     modafinil 100 MG tablet  Commonly known as:  PROVIGIL  Take one tablet by mouth every morning     oxyCODONE 5 MG immediate release tablet  Commonly known as:  ROXICODONE  Take one tablet by mouth twice daily for pain     promethazine 25 MG tablet  Commonly known as:  PHENERGAN  Take 1 tablet (25 mg total) by mouth every 6 (six) hours as needed  for nausea.     QUEtiapine 50 MG tablet  Commonly known as:  SEROQUEL  Take 1 tablet (50 mg total) by mouth at bedtime.     SM FISH OIL 1000 MG Caps  Take 6,000 mg by mouth daily.     thiamine 100 MG tablet  Place 1 tablet (100 mg total) into feeding tube daily.     zolpidem 5 MG tablet  Commonly known as:  AMBIEN  Take 1 tablet (5 mg total) by mouth at bedtime as needed for sleep.       Allergies  Allergen Reactions  . Hctz [Hydrochlorothiazide] Other (See Comments)    Dizzy spells  . Hydroxyzine Hives  . Sulfonamide Derivatives Hives  . Cetirizine & Related Other (See Comments)    unknown      The results of significant diagnostics from this hospitalization (including imaging, microbiology, ancillary and laboratory) are listed below for reference.    Significant Diagnostic Studies: Ct Abdomen Pelvis W Contrast  01/12/2014   CLINICAL DATA:  Initial evaluation for abdominal pain for 3 days, generalized abdominal pain  EXAM: CT ABDOMEN AND PELVIS WITH CONTRAST  TECHNIQUE: Multidetector CT imaging of the abdomen and pelvis was performed using the standard protocol following bolus administration of intravenous  contrast.  CONTRAST:  164mL OMNIPAQUE IOHEXOL 300 MG/ML  SOLN  COMPARISON:  08/03/2013  FINDINGS: There is mild motion artifact through the upper abdomen on the initial series but not on of delayed images. There is mild inflammatory change around the pancreas with mild dilatation of the pancreatic duct.  Liver and gallbladder are normal. Spleen is normal adrenal glands and kidneys are normal.  Mild calcification of the aorta. Small bowel large bowel and appendix are normal. Stomach is normal.  No pseudocyst.  No abscess.  No ascites.  Bladder and reproductive organs are normal. Small fat containing inguinal hernias. No acute musculoskeletal findings.  IMPRESSION: Acute pancreatitis   Electronically Signed   By: Skipper Cliche M.D.   On: 01/12/2014 18:37   Dg Abd Acute W/chest  01/15/2014   CLINICAL DATA:  Mid to lower abdominal pain x3 days  EXAM: ACUTE ABDOMEN SERIES (ABDOMEN 2 VIEW & CHEST 1 VIEW)  COMPARISON:  CT abdomen pelvis 01/09/ 2016  FINDINGS: Lungs are clear.  No pleural effusion or pneumothorax.  The heart is normal in size.  Nonobstructive bowel gas pattern.  No evidence of free air under the diaphragm on the upright view.  Residual contrast in the transverse colon.  Visualized osseous structures are within normal limits.  IMPRESSION: No evidence of acute cardiopulmonary disease.  No evidence of small bowel obstruction or free air.   Electronically Signed   By: Julian Hy M.D.   On: 01/15/2014 11:21   Dg Abd Acute W/chest  01/12/2014   CLINICAL DATA:  Abdominal pain, constipation  EXAM: ACUTE ABDOMEN SERIES (ABDOMEN 2 VIEW & CHEST 1 VIEW)  COMPARISON:  Chest radiographs dated 10/11/2013. CT abdomen pelvis dated 07/26/2013.  FINDINGS: Lungs are clear.  No pleural effusion or pneumothorax.  The heart is normal in size.  Nonobstructive bowel gas pattern.  No evidence of free air under the diaphragm on the upright view.  Normal colonic stool burden.  Mild degenerative changes of the lumbar  spine.  IMPRESSION: No evidence of acute cardiopulmonary disease.  No evidence of small bowel obstruction or free air.   Electronically Signed   By: Julian Hy M.D.   On: 01/12/2014 17:44    Microbiology: No  results found for this or any previous visit (from the past 240 hour(s)).   Labs: Basic Metabolic Panel:  Recent Labs Lab 01/12/14 1429 01/15/14 1015 01/16/14 2008 01/17/14 0340  NA 139 138 138 144  K 3.5 3.9 3.7 3.8  CL 105 103 96 111  CO2 26 26 28 26   GLUCOSE 141* 134* 117* 114*  BUN <5* 9 5* <5*  CREATININE 0.93 0.88 0.89 0.75  CALCIUM 9.2 9.1 9.6 8.9   Liver Function Tests:  Recent Labs Lab 01/12/14 1429 01/15/14 1015 01/16/14 2008 01/17/14 0340  AST 14 16 12 10   ALT 9 10 9 7   ALKPHOS 65 66 62 57  BILITOT 0.6 0.8 0.6 0.5  PROT 6.8 6.6 6.5 5.6*  ALBUMIN 3.8 3.7 3.7 3.1*    Recent Labs Lab 01/12/14 1429 01/15/14 1015 01/16/14 2008 01/18/14 0603  LIPASE 190* 127* 210* 58   No results for input(s): AMMONIA in the last 168 hours. CBC:  Recent Labs Lab 01/12/14 1429 01/15/14 1015 01/16/14 2008 01/17/14 0340  WBC 5.6 6.8 7.0 6.4  NEUTROABS 2.8 4.1 4.1  --   HGB 15.6 15.8 14.9 13.8  HCT 44.4 44.9 42.6 40.3  MCV 89.0 89.4 87.3 89.4  PLT 190 199 217 193   Cardiac Enzymes: No results for input(s): CKTOTAL, CKMB, CKMBINDEX, TROPONINI in the last 168 hours. BNP: BNP (last 3 results)  Recent Labs  02/01/13 1221 06/27/13 1933  PROBNP 192.7* 146.2*   CBG:  Recent Labs Lab 01/17/14 0740 01/18/14 0800  GLUCAP 87 70       Signed:  Doloras Tellado  [facility] 01/18/2014, 10:59 AM

## 2014-01-18 NOTE — Progress Notes (Signed)
Patient left the unit AMA.  Tried to convince him that he needed medical treatment but wasn't ready to listen.  Called brother Yusef Lamp and left message about his decision.  Called his brother again to let him know that patient already left unit.

## 2014-01-21 ENCOUNTER — Inpatient Hospital Stay (HOSPITAL_COMMUNITY)
Admission: EM | Admit: 2014-01-21 | Discharge: 2014-01-24 | DRG: 439 | Discharge status: Against Medical Advice | Payer: Medicare Other | Attending: Internal Medicine | Admitting: Internal Medicine

## 2014-01-21 ENCOUNTER — Encounter (HOSPITAL_COMMUNITY): Payer: Self-pay | Admitting: Emergency Medicine

## 2014-01-21 DIAGNOSIS — I509 Heart failure, unspecified: Secondary | ICD-10-CM

## 2014-01-21 DIAGNOSIS — F17210 Nicotine dependence, cigarettes, uncomplicated: Secondary | ICD-10-CM | POA: Diagnosis present

## 2014-01-21 DIAGNOSIS — K219 Gastro-esophageal reflux disease without esophagitis: Secondary | ICD-10-CM | POA: Diagnosis present

## 2014-01-21 DIAGNOSIS — K824 Cholesterolosis of gallbladder: Secondary | ICD-10-CM | POA: Diagnosis present

## 2014-01-21 DIAGNOSIS — F1290 Cannabis use, unspecified, uncomplicated: Secondary | ICD-10-CM | POA: Diagnosis present

## 2014-01-21 DIAGNOSIS — G40909 Epilepsy, unspecified, not intractable, without status epilepticus: Secondary | ICD-10-CM | POA: Diagnosis present

## 2014-01-21 DIAGNOSIS — I2510 Atherosclerotic heart disease of native coronary artery without angina pectoris: Secondary | ICD-10-CM | POA: Diagnosis present

## 2014-01-21 DIAGNOSIS — F10231 Alcohol dependence with withdrawal delirium: Secondary | ICD-10-CM | POA: Diagnosis present

## 2014-01-21 DIAGNOSIS — Z791 Long term (current) use of non-steroidal anti-inflammatories (NSAID): Secondary | ICD-10-CM

## 2014-01-21 DIAGNOSIS — F200 Paranoid schizophrenia: Secondary | ICD-10-CM | POA: Diagnosis present

## 2014-01-21 DIAGNOSIS — J45909 Unspecified asthma, uncomplicated: Secondary | ICD-10-CM | POA: Diagnosis present

## 2014-01-21 DIAGNOSIS — R1013 Epigastric pain: Secondary | ICD-10-CM

## 2014-01-21 DIAGNOSIS — K852 Alcohol induced acute pancreatitis: Secondary | ICD-10-CM | POA: Diagnosis not present

## 2014-01-21 DIAGNOSIS — E7800 Pure hypercholesterolemia, unspecified: Secondary | ICD-10-CM | POA: Diagnosis present

## 2014-01-21 DIAGNOSIS — F1010 Alcohol abuse, uncomplicated: Secondary | ICD-10-CM | POA: Diagnosis present

## 2014-01-21 DIAGNOSIS — K859 Acute pancreatitis without necrosis or infection, unspecified: Secondary | ICD-10-CM

## 2014-01-21 DIAGNOSIS — Z7982 Long term (current) use of aspirin: Secondary | ICD-10-CM

## 2014-01-21 DIAGNOSIS — F209 Schizophrenia, unspecified: Secondary | ICD-10-CM

## 2014-01-21 DIAGNOSIS — I5032 Chronic diastolic (congestive) heart failure: Secondary | ICD-10-CM | POA: Diagnosis present

## 2014-01-21 DIAGNOSIS — R569 Unspecified convulsions: Secondary | ICD-10-CM

## 2014-01-21 DIAGNOSIS — G8929 Other chronic pain: Secondary | ICD-10-CM | POA: Diagnosis present

## 2014-01-21 DIAGNOSIS — E785 Hyperlipidemia, unspecified: Secondary | ICD-10-CM | POA: Diagnosis present

## 2014-01-21 DIAGNOSIS — I10 Essential (primary) hypertension: Secondary | ICD-10-CM | POA: Diagnosis present

## 2014-01-21 DIAGNOSIS — Z7902 Long term (current) use of antithrombotics/antiplatelets: Secondary | ICD-10-CM

## 2014-01-21 HISTORY — DX: Acute pancreatitis without necrosis or infection, unspecified: K85.90

## 2014-01-21 LAB — URINALYSIS, ROUTINE W REFLEX MICROSCOPIC
Bilirubin Urine: NEGATIVE
Glucose, UA: NEGATIVE mg/dL
Hgb urine dipstick: NEGATIVE
Ketones, ur: NEGATIVE mg/dL
LEUKOCYTES UA: NEGATIVE
Nitrite: NEGATIVE
PH: 7.5 (ref 5.0–8.0)
Protein, ur: NEGATIVE mg/dL
SPECIFIC GRAVITY, URINE: 1.007 (ref 1.005–1.030)
Urobilinogen, UA: 1 mg/dL (ref 0.0–1.0)

## 2014-01-21 LAB — CBC WITH DIFFERENTIAL/PLATELET
Basophils Absolute: 0 10*3/uL (ref 0.0–0.1)
Basophils Relative: 0 % (ref 0–1)
EOS ABS: 0.3 10*3/uL (ref 0.0–0.7)
EOS PCT: 4 % (ref 0–5)
HCT: 43.7 % (ref 39.0–52.0)
Hemoglobin: 15.8 g/dL (ref 13.0–17.0)
Lymphocytes Relative: 40 % (ref 12–46)
Lymphs Abs: 2.4 10*3/uL (ref 0.7–4.0)
MCH: 31.5 pg (ref 26.0–34.0)
MCHC: 36.2 g/dL — AB (ref 30.0–36.0)
MCV: 87.1 fL (ref 78.0–100.0)
MONOS PCT: 9 % (ref 3–12)
Monocytes Absolute: 0.6 10*3/uL (ref 0.1–1.0)
NEUTROS ABS: 2.8 10*3/uL (ref 1.7–7.7)
NEUTROS PCT: 46 % (ref 43–77)
PLATELETS: 308 10*3/uL (ref 150–400)
RBC: 5.02 MIL/uL (ref 4.22–5.81)
RDW: 14.4 % (ref 11.5–15.5)
WBC: 6.1 10*3/uL (ref 4.0–10.5)

## 2014-01-21 LAB — COMPREHENSIVE METABOLIC PANEL
ALK PHOS: 66 U/L (ref 39–117)
ALT: 12 U/L (ref 0–53)
ANION GAP: 6 (ref 5–15)
AST: 16 U/L (ref 0–37)
Albumin: 3.7 g/dL (ref 3.5–5.2)
BUN: 8 mg/dL (ref 6–23)
CALCIUM: 9.1 mg/dL (ref 8.4–10.5)
CO2: 34 mmol/L — ABNORMAL HIGH (ref 19–32)
CREATININE: 1.11 mg/dL (ref 0.50–1.35)
Chloride: 97 mEq/L (ref 96–112)
GFR, EST AFRICAN AMERICAN: 90 mL/min — AB (ref >90)
GFR, EST NON AFRICAN AMERICAN: 77 mL/min — AB (ref >90)
Glucose, Bld: 104 mg/dL — ABNORMAL HIGH (ref 70–99)
POTASSIUM: 3.4 mmol/L — AB (ref 3.5–5.1)
SODIUM: 137 mmol/L (ref 135–145)
Total Bilirubin: 0.5 mg/dL (ref 0.3–1.2)
Total Protein: 7.1 g/dL (ref 6.0–8.3)

## 2014-01-21 LAB — RAPID URINE DRUG SCREEN, HOSP PERFORMED
AMPHETAMINES: NOT DETECTED
Barbiturates: NOT DETECTED
Benzodiazepines: NOT DETECTED
COCAINE: NOT DETECTED
OPIATES: NOT DETECTED
TETRAHYDROCANNABINOL: NOT DETECTED

## 2014-01-21 LAB — LIPASE, BLOOD: LIPASE: 515 U/L — AB (ref 11–59)

## 2014-01-21 LAB — ETHANOL

## 2014-01-21 MED ORDER — SODIUM CHLORIDE 0.9 % IV BOLUS (SEPSIS)
1000.0000 mL | Freq: Once | INTRAVENOUS | Status: AC
  Administered 2014-01-21: 1000 mL via INTRAVENOUS

## 2014-01-21 NOTE — ED Notes (Signed)
Report to melanie, pt care transferred

## 2014-01-21 NOTE — ED Notes (Signed)
Pt. reports persistent low abdominal pain for several days , denies nausea or vomitting , no fever or chills, pt. was admitted last week for pancreatitis/ETOH abuse but sign out AMA.

## 2014-01-21 NOTE — ED Provider Notes (Signed)
CSN: [id number]     Arrival date & time 01/21/14  2216 History   First MD Initiated Contact with Patient 01/21/14 2226     Chief Complaint  Patient presents with  . Abdominal Pain     (Consider location/radiation/quality/duration/timing/severity/associated sxs/prior Treatment) The history is provided by the patient. No language interpreter was used.  Chase Blackwell is a 49 y/o M with PMHx of hypertension, asthma, GERD, chronic pain, schizophrenia, alcohol abuse, seizures, pancreatitis presenting to the emergency department with abdominal pain. Patient reports the abdominal pain is worse and was a last time described as a constant pain. Stated that the pain is localized to the epigastric region without radiation. Stated that he's been taking Tylenol with minimal relief. Patient was recently admitted to hospital in 01/16/2014 for acute pancreatitis with elevated lipase of 290. He was seen that patient left AMA on 01/18/2014-when this provider asked why the patient left AMA, patient reported that "they told me to" when asked who "they" were patient reported that he saw his mother. Denied nausea, vomiting, diarrhea, melena, hematochezia, changes to eating, headache, dizziness, chest pain, shortness of breath, difficulty breathing, suicidal ideation, homicidal ideation, auditory or visual hallucinations. Denied alcohol, marijuana, heroin, cocaine. States he does smoke approximately 2 packs of cigarettes per day. [facility]  Past Medical History  Diagnosis Date  . Hypertension   . Asthma   . GERD (gastroesophageal reflux disease)   . Chronic pain   . Schizophrenia   . Alcohol abuse   . Seizures   . Pancreatitis    Past Surgical History  Procedure Laterality Date  . Left heart catheterization with coronary angiogram N/A 07/06/2013    Procedure: LEFT HEART CATHETERIZATION WITH CORONARY ANGIOGRAM;  Surgeon: Clent Demark, MD;  Location: [facility] CATH LAB;  Service: Cardiovascular;  Laterality:  N/A;  . Percutaneous coronary stent intervention (pci-s)  07/06/2013    Procedure: PERCUTANEOUS CORONARY STENT INTERVENTION (PCI-S);  Surgeon: Clent Demark, MD;  Location: [facility] CATH LAB;  Service: Cardiovascular;;   Family History  Problem Relation Age of Onset  . Malignant hyperthermia Mother   . Cirrhosis Father   . Alcohol abuse Father    History  Substance Use Topics  . Smoking status: Current Every Day Smoker -- 1.00 packs/day    Types: Cigarettes  . Smokeless tobacco: Not on file  . Alcohol Use: 1.2 oz/week    2 Cans of beer per week     Comment: 1 400 oz per week    Review of Systems  Constitutional: Negative for fever and chills.  Respiratory: Negative for chest tightness and shortness of breath.   Cardiovascular: Negative for chest pain.  Gastrointestinal: Positive for abdominal pain. Negative for nausea and vomiting.  Genitourinary: Negative for dysuria, hematuria and decreased urine volume.  Neurological: Negative for dizziness and headaches.      Allergies  Hctz; Hydroxyzine; Sulfonamide derivatives; and Cetirizine & related  Home Medications   Prior to Admission medications   Medication Sig Start Date End Date Taking? Authorizing Provider  albuterol (PROVENTIL HFA;VENTOLIN HFA) 108 (90 BASE) MCG/ACT inhaler Inhale 1-2 puffs into the lungs every 4 (four) hours as needed for wheezing or shortness of breath. 11/23/13   Kristen N Badie, DO  aspirin 81 MG chewable tablet Chew 1 tablet (81 mg total) by mouth daily. 11/23/13   Kristen N Etsitty, DO  atorvastatin (LIPITOR) 80 MG tablet Take 1 tablet (80 mg total) by mouth at bedtime. 11/23/13   [place],  DO  busPIRone (BUSPAR) 5 MG tablet Take 5 mg by mouth 2 (two) times daily.    Historical Provider, MD  clopidogrel (PLAVIX) 75 MG tablet Take 1 tablet (75 mg total) by mouth daily. 11/23/13   Kristen N Depolo, DO  divalproex (DEPAKOTE) 500 MG DR tablet Take 1 tablet (500 mg total) by mouth 2 (two) times daily. 11/23/13    Kristen N Merkle, DO  famotidine (PEPCID) 20 MG tablet Take 1 tablet (20 mg total) by mouth 2 (two) times daily. 11/23/13   Kristen N Zawistowski, DO  folic acid (FOLVITE) 1 MG tablet Place 1 tablet (1 mg total) into feeding tube daily. Patient taking differently: Take 1 mg by mouth daily.  07/23/13   Donita Brooks, NP  furosemide (LASIX) 40 MG tablet Take 1 tablet (40 mg total) by mouth daily. 11/23/13   Kristen N Silsby, DO  haloperidol decanoate (HALDOL DECANOATE) 50 MG/ML injection Inject 100 mg into the muscle every 28 (twenty-eight) days.    Historical Provider, MD  hydrALAZINE (APRESOLINE) 10 MG tablet Take 1 tablet (10 mg total) by mouth 2 (two) times daily. 11/23/13   Kristen N Marner, DO  levETIRAcetam (KEPPRA) 1000 MG tablet Take 1 tablet (1,000 mg total) by mouth 2 (two) times daily. 11/23/13   Kristen N Olivero, DO  lisinopril (PRINIVIL,ZESTRIL) 2.5 MG tablet Take 1 tablet (2.5 mg total) by mouth daily. 11/23/13   Kristen N Chow, DO  LORazepam (ATIVAN) 0.5 MG tablet Take 0.5 mg by mouth every 8 (eight) hours as needed for anxiety.    Historical Provider, MD  Melatonin 5 MG TABS Take 1 tablet by mouth at bedtime.    Historical Provider, MD  meloxicam (MOBIC) 15 MG tablet Take 1 tablet (15 mg total) by mouth daily. 11/13/13   Kaitlyn Szekalski, PA-C  metoprolol tartrate (LOPRESSOR) 25 MG tablet Take 0.5 tablets (12.5 mg total) by mouth 2 (two) times daily. 11/23/13   Kristen N Dekoning, DO  modafinil (PROVIGIL) 100 MG tablet Take one tablet by mouth every morning 08/30/13   Tiffany L Reed, DO  Multiple Vitamins-Minerals (CENTAMIN) LIQD Take 5 mLs by mouth daily.    Historical Provider, MD  Omega-3 Fatty Acids (SM FISH OIL) 1000 MG CAPS Take 6,000 mg by mouth daily.    Historical Provider, MD  oxyCODONE (ROXICODONE) 5 MG immediate release tablet Take one tablet by mouth twice daily for pain 10/01/13   Tiffany L Reed, DO  promethazine (PHENERGAN) 25 MG tablet Take 1 tablet (25 mg total) by mouth every 6 (six)  hours as needed for nausea. 01/15/14   Nat Christen, MD  QUEtiapine (SEROQUEL) 50 MG tablet Take 1 tablet (50 mg total) by mouth at bedtime. 11/23/13   Kristen N Shanks, DO  thiamine 100 MG tablet Place 1 tablet (100 mg total) into feeding tube daily. Patient taking differently: Take 100 mg by mouth daily.  07/23/13   Donita Brooks, NP  zolpidem (AMBIEN) 5 MG tablet Take 1 tablet (5 mg total) by mouth at bedtime as needed for sleep. 08/30/13   Tiffany L Reed, DO   BP 131/88 mmHg  Pulse 60  Temp(Src) 98.7 F (37.1 C)  Resp 11  SpO2 97% Physical Exam  Constitutional: He is oriented to person, place, and time. He appears well-developed and well-nourished. No distress.  HENT:  Head: Normocephalic and atraumatic.  Eyes: Conjunctivae and EOM are normal. Pupils are equal, round, and reactive to light. Right eye exhibits no discharge. Left eye  exhibits no discharge.  Neck: Normal range of motion. Neck supple.  Cardiovascular: Normal rate, regular rhythm and normal heart sounds.  Exam reveals no friction rub.   No murmur heard. Pulmonary/Chest: Effort normal and breath sounds normal. No respiratory distress. He has no wheezes. He has no rales.  Abdominal: Soft. Bowel sounds are normal. He exhibits no distension. There is tenderness in the epigastric area. There is no rebound and no guarding.  Negative abdominal distension  BS normoactive in all 4 quadrants Abdomen soft upon palpation  Tenderness upon palpation to the epigastric region  Negative peritoneal signs  Negative guarding or rigidity noted  Musculoskeletal: Normal range of motion.  Neurological: He is alert and oriented to person, place, and time. No cranial nerve deficit. He exhibits normal muscle tone. Coordination normal.  Cranial nerves III-XII grossly intact Patient follows commands Patient responds to questions appropriately   Skin: Skin is warm and dry. No rash noted. He is not diaphoretic. No erythema.  Psychiatric:  Flat affect    Nursing note and vitals reviewed.   ED Course  Procedures (including critical care time)  Results for orders placed or performed during the hospital encounter of 01/21/14  CBC with Differential  Result Value Ref Range   WBC 6.1 4.0 - 10.5 K/uL   RBC 5.02 4.22 - 5.81 MIL/uL   Hemoglobin 15.8 13.0 - 17.0 g/dL   HCT 43.7 39.0 - 52.0 %   MCV 87.1 78.0 - 100.0 fL   MCH 31.5 26.0 - 34.0 pg   MCHC 36.2 (H) 30.0 - 36.0 g/dL   RDW 14.4 11.5 - 15.5 %   Platelets 308 150 - 400 K/uL   Neutrophils Relative % 46 43 - 77 %   Neutro Abs 2.8 1.7 - 7.7 K/uL   Lymphocytes Relative 40 12 - 46 %   Lymphs Abs 2.4 0.7 - 4.0 K/uL   Monocytes Relative 9 3 - 12 %   Monocytes Absolute 0.6 0.1 - 1.0 K/uL   Eosinophils Relative 4 0 - 5 %   Eosinophils Absolute 0.3 0.0 - 0.7 K/uL   Basophils Relative 0 0 - 1 %   Basophils Absolute 0.0 0.0 - 0.1 K/uL  Lipase, blood  Result Value Ref Range   Lipase 515 (H) 11 - 59 U/L  Comprehensive metabolic panel  Result Value Ref Range   Sodium 137 135 - 145 mmol/L   Potassium 3.4 (L) 3.5 - 5.1 mmol/L   Chloride 97 96 - 112 mEq/L   CO2 34 (H) 19 - 32 mmol/L   Glucose, Bld 104 (H) 70 - 99 mg/dL   BUN 8 6 - 23 mg/dL   Creatinine, Ser 1.11 0.50 - 1.35 mg/dL   Calcium 9.1 8.4 - 10.5 mg/dL   Total Protein 7.1 6.0 - 8.3 g/dL   Albumin 3.7 3.5 - 5.2 g/dL   AST 16 0 - 37 U/L   ALT 12 0 - 53 U/L   Alkaline Phosphatase 66 39 - 117 U/L   Total Bilirubin 0.5 0.3 - 1.2 mg/dL   GFR calc non Af Amer 77 (L) >90 mL/min   GFR calc Af Amer 90 (L) >90 mL/min   Anion gap 6 5 - 15  Urinalysis, Routine w reflex microscopic  Result Value Ref Range   Color, Urine YELLOW YELLOW   APPearance CLEAR CLEAR   Specific Gravity, Urine 1.007 1.005 - 1.030   pH 7.5 5.0 - 8.0   Glucose, UA NEGATIVE NEGATIVE mg/dL   Hgb  urine dipstick NEGATIVE NEGATIVE   Bilirubin Urine NEGATIVE NEGATIVE   Ketones, ur NEGATIVE NEGATIVE mg/dL   Protein, ur NEGATIVE NEGATIVE mg/dL   Urobilinogen, UA 1.0  0.0 - 1.0 mg/dL   Nitrite NEGATIVE NEGATIVE   Leukocytes, UA NEGATIVE NEGATIVE  Ethanol  Result Value Ref Range   Alcohol, Ethyl (B) <5 0 - 9 mg/dL  Urine rapid drug screen (hosp performed)  Result Value Ref Range   Opiates NONE DETECTED NONE DETECTED   Cocaine NONE DETECTED NONE DETECTED   Benzodiazepines NONE DETECTED NONE DETECTED   Amphetamines NONE DETECTED NONE DETECTED   Tetrahydrocannabinol NONE DETECTED NONE DETECTED   Barbiturates NONE DETECTED NONE DETECTED   Labs Review Labs Reviewed  CBC WITH DIFFERENTIAL - Abnormal; Notable for the following:    MCHC 36.2 (*)    All other components within normal limits  LIPASE, BLOOD - Abnormal; Notable for the following:    Lipase 515 (*)    All other components within normal limits  COMPREHENSIVE METABOLIC PANEL - Abnormal; Notable for the following:    Potassium 3.4 (*)    CO2 34 (*)    Glucose, Bld 104 (*)    GFR calc non Af Amer 77 (*)    GFR calc Af Amer 90 (*)    All other components within normal limits  URINALYSIS, ROUTINE W REFLEX MICROSCOPIC  ETHANOL  URINE RAPID DRUG SCREEN (HOSP PERFORMED)  BASIC METABOLIC PANEL  CBC  VALPROIC ACID LEVEL  ACETAMINOPHEN LEVEL  SALICYLATE LEVEL    Imaging Review No results found.   EKG Interpretation None       12:33 AM This provider spoke with Dr. Alcario Drought, [facility] - discussed case, labs, ED course, vitals in great detail. Patient to be medically to [place] for acute pancreatitis.  MDM   Final diagnoses:  Acute pancreatitis, unspecified pancreatitis type  Alcohol abuse  Epigastric pain  Schizophrenia, unspecified type    Medications  0.9 %  sodium chloride infusion ( Intravenous New Bag/Given 01/22/14 0039)  thiamine (VITAMIN B-1) tablet 100 mg (not administered)  zolpidem (AMBIEN) tablet 5 mg (not administered)  QUEtiapine (SEROQUEL) tablet 50 mg (not administered)  promethazine (PHENERGAN) tablet 25 mg (not administered)  folic acid (FOLVITE) tablet  1 mg (not administered)  divalproex (DEPAKOTE) DR tablet 500 mg (not administered)  clopidogrel (PLAVIX) tablet 75 mg (not administered)  famotidine (PEPCID) tablet 20 mg (not administered)  levETIRAcetam (KEPPRA) tablet 1,000 mg (not administered)  modafinil (PROVIGIL) tablet 100 mg (not administered)  metoprolol tartrate (LOPRESSOR) tablet 12.5 mg (not administered)  hydrALAZINE (APRESOLINE) tablet 10 mg (not administered)  aspirin chewable tablet 81 mg (not administered)  atorvastatin (LIPITOR) tablet 80 mg (not administered)  busPIRone (BUSPAR) tablet 5 mg (not administered)  albuterol (PROVENTIL HFA;VENTOLIN HFA) 108 (90 BASE) MCG/ACT inhaler 1-2 puff (not administered)  lisinopril (PRINIVIL,ZESTRIL) tablet 2.5 mg (not administered)  LORazepam (ATIVAN) tablet 0.5 mg (not administered)  meloxicam (MOBIC) tablet 15 mg (not administered)  Melatonin TABS 5 mg (not administered)  CENTAMIN LIQD 5 mL (not administered)  ondansetron (ZOFRAN) injection 4 mg (not administered)  morphine 2 MG/ML injection 2-4 mg (2 mg Intravenous Given 01/22/14 0045)  heparin injection 5,000 Units (not administered)  sodium chloride 0.9 % bolus 1,000 mL (0 mLs Intravenous Stopped 01/22/14 0030)    Filed Vitals:   01/21/14 2315 01/22/14 0030 01/22/14 0045 01/22/14 0100  BP: 119/80 125/77 126/78 131/88  Pulse: 57 56 64 60  Temp:      Resp:  11 11    SpO2: 98% 100% 100% 97%   This provider reviewed the patient's chart. Patient is been seen and assessed in ED setting numerous times regarding abdominal pain. Patient was first diagnosed with acute pancreatitis on 01/12/2013-elevated lipase and CT abdomen pelvis with contrast identified acute pancreatitis. Admission was recommended the patient declined. Patient was then seen again on January 12 and 01/16/2014 regarding similar pain-patient finally agreed to admission on 01/16/2014. On 01/18/2014, patient left the hospital Opal was signed out. Patient  returns to the ED today regarding continuous abdominal pain that is now worsened with time. CBC negative elevated leukocytosis. Hemoglobin 15.8, hematocrit 47.3. CMP noted mild hypokalemia 3.4. Glucose 104-negative elevated anion gap 6.0 mEq/L. Bicarbonate mildly elevated at 34. Ethanol level negative elevation. Lipase elevated at 515. Urinalysis negative for infection. Urine drug screen negative. Valproic acid, Salicyclate, acetaminophen level pending. Lipase continues to elevate-when compared to 4 days ago patient's lipase was 58, today lipase is elevated to 515. Patient presenting to the ED with acute pancreatitis. This provider had a long conversation with the patient regarding concerns and fatality if pancreatitis is not properly treated. Patient understood. Patient started on IV fluids in ED setting. Discussed case in great detail with [facility], Dr. Darra Lis to be admitted to [place] for acute pancreatitis. Patient stable for transfer.  Jamse Mead, PA-C 01/22/14 0109  Charlesetta Shanks, MD 01/22/14 2236

## 2014-01-22 ENCOUNTER — Observation Stay (HOSPITAL_COMMUNITY): Payer: Medicare Other

## 2014-01-22 DIAGNOSIS — F200 Paranoid schizophrenia: Secondary | ICD-10-CM | POA: Diagnosis present

## 2014-01-22 DIAGNOSIS — R569 Unspecified convulsions: Secondary | ICD-10-CM

## 2014-01-22 DIAGNOSIS — Z7902 Long term (current) use of antithrombotics/antiplatelets: Secondary | ICD-10-CM | POA: Diagnosis not present

## 2014-01-22 DIAGNOSIS — I5032 Chronic diastolic (congestive) heart failure: Secondary | ICD-10-CM

## 2014-01-22 DIAGNOSIS — I2510 Atherosclerotic heart disease of native coronary artery without angina pectoris: Secondary | ICD-10-CM | POA: Diagnosis present

## 2014-01-22 DIAGNOSIS — F17210 Nicotine dependence, cigarettes, uncomplicated: Secondary | ICD-10-CM | POA: Diagnosis present

## 2014-01-22 DIAGNOSIS — E780 Pure hypercholesterolemia: Secondary | ICD-10-CM | POA: Diagnosis present

## 2014-01-22 DIAGNOSIS — K859 Acute pancreatitis without necrosis or infection, unspecified: Secondary | ICD-10-CM | POA: Diagnosis present

## 2014-01-22 DIAGNOSIS — I25810 Atherosclerosis of coronary artery bypass graft(s) without angina pectoris: Secondary | ICD-10-CM

## 2014-01-22 DIAGNOSIS — F1010 Alcohol abuse, uncomplicated: Secondary | ICD-10-CM

## 2014-01-22 DIAGNOSIS — J45909 Unspecified asthma, uncomplicated: Secondary | ICD-10-CM | POA: Diagnosis present

## 2014-01-22 DIAGNOSIS — G8929 Other chronic pain: Secondary | ICD-10-CM | POA: Diagnosis present

## 2014-01-22 DIAGNOSIS — E785 Hyperlipidemia, unspecified: Secondary | ICD-10-CM | POA: Diagnosis present

## 2014-01-22 DIAGNOSIS — F10231 Alcohol dependence with withdrawal delirium: Secondary | ICD-10-CM | POA: Diagnosis present

## 2014-01-22 DIAGNOSIS — K824 Cholesterolosis of gallbladder: Secondary | ICD-10-CM | POA: Diagnosis present

## 2014-01-22 DIAGNOSIS — I10 Essential (primary) hypertension: Secondary | ICD-10-CM | POA: Diagnosis present

## 2014-01-22 DIAGNOSIS — F1290 Cannabis use, unspecified, uncomplicated: Secondary | ICD-10-CM | POA: Diagnosis present

## 2014-01-22 DIAGNOSIS — K852 Alcohol induced acute pancreatitis: Secondary | ICD-10-CM | POA: Diagnosis present

## 2014-01-22 DIAGNOSIS — R1013 Epigastric pain: Secondary | ICD-10-CM | POA: Diagnosis present

## 2014-01-22 DIAGNOSIS — Z791 Long term (current) use of non-steroidal anti-inflammatories (NSAID): Secondary | ICD-10-CM | POA: Diagnosis not present

## 2014-01-22 DIAGNOSIS — K219 Gastro-esophageal reflux disease without esophagitis: Secondary | ICD-10-CM | POA: Diagnosis present

## 2014-01-22 DIAGNOSIS — G40909 Epilepsy, unspecified, not intractable, without status epilepticus: Secondary | ICD-10-CM | POA: Diagnosis present

## 2014-01-22 DIAGNOSIS — Z7982 Long term (current) use of aspirin: Secondary | ICD-10-CM | POA: Diagnosis not present

## 2014-01-22 LAB — CBC
HCT: 41.6 % (ref 39.0–52.0)
Hemoglobin: 14.3 g/dL (ref 13.0–17.0)
MCH: 30 pg (ref 26.0–34.0)
MCHC: 34.4 g/dL (ref 30.0–36.0)
MCV: 87.4 fL (ref 78.0–100.0)
PLATELETS: 271 10*3/uL (ref 150–400)
RBC: 4.76 MIL/uL (ref 4.22–5.81)
RDW: 14.4 % (ref 11.5–15.5)
WBC: 6.1 10*3/uL (ref 4.0–10.5)

## 2014-01-22 LAB — BASIC METABOLIC PANEL
ANION GAP: 9 (ref 5–15)
BUN: 7 mg/dL (ref 6–23)
CALCIUM: 8.5 mg/dL (ref 8.4–10.5)
CO2: 25 mmol/L (ref 19–32)
Chloride: 107 mEq/L (ref 96–112)
Creatinine, Ser: 0.82 mg/dL (ref 0.50–1.35)
GFR calc Af Amer: >90 mL/min (ref >90)
GFR calc non Af Amer: >90 mL/min (ref >90)
Glucose, Bld: 99 mg/dL (ref 70–99)
POTASSIUM: 3.6 mmol/L (ref 3.5–5.1)
Sodium: 141 mmol/L (ref 135–145)

## 2014-01-22 LAB — ACETAMINOPHEN LEVEL: Acetaminophen (Tylenol), Serum: <10 ug/mL — ABNORMAL LOW (ref 10–30)

## 2014-01-22 LAB — VALPROIC ACID LEVEL: Valproic Acid Lvl: 55.2 ug/mL (ref 50.0–100.0)

## 2014-01-22 LAB — SALICYLATE LEVEL: Salicylate Lvl: <4 mg/dL (ref 2.8–20.0)

## 2014-01-22 MED ORDER — ADULT MULTIVITAMIN LIQUID CH
5.0000 mL | Freq: Every day | ORAL | Status: DC
  Filled 2014-01-22: qty 5

## 2014-01-22 MED ORDER — LORAZEPAM 0.5 MG PO TABS: 0.5000 mg | ORAL_TABLET | Freq: Three times a day (TID) | ORAL | Status: DC | PRN

## 2014-01-22 MED ORDER — MODAFINIL 100 MG PO TABS
100.0000 mg | ORAL_TABLET | Freq: Every day | ORAL | Status: DC
  Administered 2014-01-22 – 2014-01-23 (×2): 100 mg via ORAL
  Filled 2014-01-22 (×2): qty 1

## 2014-01-22 MED ORDER — FOLIC ACID 1 MG PO TABS
1.0000 mg | ORAL_TABLET | Freq: Every day | ORAL | Status: DC
  Filled 2014-01-22: qty 1

## 2014-01-22 MED ORDER — VITAMIN B-1 100 MG PO TABS
100.0000 mg | ORAL_TABLET | Freq: Every day | ORAL | Status: DC
  Filled 2014-01-22: qty 1

## 2014-01-22 MED ORDER — HEPARIN SODIUM (PORCINE) 5000 UNIT/ML IJ SOLN
5000.0000 [IU] | Freq: Three times a day (TID) | INTRAMUSCULAR | Status: DC
  Administered 2014-01-22 – 2014-01-24 (×7): 5000 [IU] via SUBCUTANEOUS
  Filled 2014-01-22 (×10): qty 1

## 2014-01-22 MED ORDER — BUSPIRONE HCL 5 MG PO TABS
5.0000 mg | ORAL_TABLET | Freq: Two times a day (BID) | ORAL | Status: DC
  Administered 2014-01-22 – 2014-01-23 (×4): 5 mg via ORAL
  Filled 2014-01-22 (×6): qty 1

## 2014-01-22 MED ORDER — MELOXICAM 15 MG PO TABS
15.0000 mg | ORAL_TABLET | Freq: Every day | ORAL | Status: DC
  Filled 2014-01-22: qty 1

## 2014-01-22 MED ORDER — PROMETHAZINE HCL 25 MG PO TABS: 25.0000 mg | ORAL_TABLET | Freq: Four times a day (QID) | ORAL | Status: DC | PRN

## 2014-01-22 MED ORDER — DIVALPROEX SODIUM 500 MG PO DR TAB
500.0000 mg | DELAYED_RELEASE_TABLET | Freq: Two times a day (BID) | ORAL | Status: DC
  Filled 2014-01-22 (×2): qty 1

## 2014-01-22 MED ORDER — LORAZEPAM 2 MG/ML IJ SOLN: 1.0000 mg | Freq: Four times a day (QID) | INTRAMUSCULAR | Status: DC | PRN

## 2014-01-22 MED ORDER — ALBUTEROL SULFATE HFA 108 (90 BASE) MCG/ACT IN AERS: 1.0000 | INHALATION_SPRAY | RESPIRATORY_TRACT | Status: DC | PRN

## 2014-01-22 MED ORDER — MELATONIN 5 MG PO TABS: 1.0000 | ORAL_TABLET | Freq: Every day | ORAL | Status: DC

## 2014-01-22 MED ORDER — ASPIRIN 81 MG PO CHEW
81.0000 mg | CHEWABLE_TABLET | Freq: Every day | ORAL | Status: DC
  Administered 2014-01-22 – 2014-01-23 (×2): 81 mg via ORAL
  Filled 2014-01-22 (×3): qty 1

## 2014-01-22 MED ORDER — CLOPIDOGREL BISULFATE 75 MG PO TABS
75.0000 mg | ORAL_TABLET | Freq: Every day | ORAL | Status: DC
  Administered 2014-01-22 – 2014-01-23 (×2): 75 mg via ORAL
  Filled 2014-01-22 (×3): qty 1

## 2014-01-22 MED ORDER — ADULT MULTIVITAMIN W/MINERALS CH
1.0000 | ORAL_TABLET | Freq: Every day | ORAL | Status: DC
  Administered 2014-01-22 – 2014-01-23 (×2): 1 via ORAL
  Filled 2014-01-22 (×3): qty 1

## 2014-01-22 MED ORDER — ALBUTEROL SULFATE (2.5 MG/3ML) 0.083% IN NEBU: 3.0000 mL | INHALATION_SOLUTION | RESPIRATORY_TRACT | Status: DC | PRN

## 2014-01-22 MED ORDER — HYDRALAZINE HCL 10 MG PO TABS
10.0000 mg | ORAL_TABLET | Freq: Two times a day (BID) | ORAL | Status: DC
  Filled 2014-01-22 (×3): qty 1

## 2014-01-22 MED ORDER — MORPHINE SULFATE 2 MG/ML IJ SOLN
2.0000 mg | INTRAMUSCULAR | Status: DC | PRN
  Administered 2014-01-22: 2 mg via INTRAVENOUS
  Administered 2014-01-22 (×2): 4 mg via INTRAVENOUS
  Filled 2014-01-22 (×2): qty 2
  Filled 2014-01-22: qty 1

## 2014-01-22 MED ORDER — FOLIC ACID 1 MG PO TABS
1.0000 mg | ORAL_TABLET | Freq: Every day | ORAL | Status: DC
  Administered 2014-01-22 – 2014-01-23 (×2): 1 mg via ORAL
  Filled 2014-01-22 (×3): qty 1

## 2014-01-22 MED ORDER — LEVETIRACETAM IN NACL 1000 MG/100ML IV SOLN
1000.0000 mg | Freq: Two times a day (BID) | INTRAVENOUS | Status: DC
  Administered 2014-01-22 – 2014-01-23 (×4): 1000 mg via INTRAVENOUS
  Filled 2014-01-22 (×7): qty 100

## 2014-01-22 MED ORDER — OXYCODONE HCL 5 MG PO TABS: 5.0000 mg | ORAL_TABLET | Freq: Four times a day (QID) | ORAL | Status: DC | PRN

## 2014-01-22 MED ORDER — LORAZEPAM 2 MG/ML IJ SOLN: 0.0000 mg | Freq: Two times a day (BID) | INTRAMUSCULAR | Status: DC

## 2014-01-22 MED ORDER — ATORVASTATIN CALCIUM 80 MG PO TABS
80.0000 mg | ORAL_TABLET | Freq: Every day | ORAL | Status: DC
  Administered 2014-01-22 – 2014-01-23 (×2): 80 mg via ORAL
  Filled 2014-01-22 (×4): qty 1

## 2014-01-22 MED ORDER — SODIUM CHLORIDE 0.9 % IV SOLN
INTRAVENOUS | Status: DC
  Administered 2014-01-22 – 2014-01-24 (×3): via INTRAVENOUS

## 2014-01-22 MED ORDER — QUETIAPINE FUMARATE 50 MG PO TABS
50.0000 mg | ORAL_TABLET | Freq: Every day | ORAL | Status: DC
  Administered 2014-01-22 – 2014-01-23 (×2): 50 mg via ORAL
  Filled 2014-01-22 (×3): qty 1

## 2014-01-22 MED ORDER — ONDANSETRON HCL 4 MG/2ML IJ SOLN
4.0000 mg | Freq: Four times a day (QID) | INTRAMUSCULAR | Status: DC | PRN
  Administered 2014-01-22: 4 mg via INTRAVENOUS
  Filled 2014-01-22: qty 2

## 2014-01-22 MED ORDER — LISINOPRIL 2.5 MG PO TABS
2.5000 mg | ORAL_TABLET | Freq: Every day | ORAL | Status: DC
  Filled 2014-01-22: qty 1

## 2014-01-22 MED ORDER — THIAMINE HCL 100 MG/ML IJ SOLN
100.0000 mg | Freq: Every day | INTRAMUSCULAR | Status: DC
  Administered 2014-01-23: 100 mg via INTRAVENOUS
  Filled 2014-01-22 (×3): qty 1

## 2014-01-22 MED ORDER — VITAMIN B-1 100 MG PO TABS
100.0000 mg | ORAL_TABLET | Freq: Every day | ORAL | Status: DC
  Administered 2014-01-22: 100 mg via ORAL
  Filled 2014-01-22 (×3): qty 1

## 2014-01-22 MED ORDER — ZOLPIDEM TARTRATE 5 MG PO TABS: 5.0000 mg | ORAL_TABLET | Freq: Every evening | ORAL | Status: DC | PRN

## 2014-01-22 MED ORDER — MORPHINE SULFATE 2 MG/ML IJ SOLN
2.0000 mg | INTRAMUSCULAR | Status: DC | PRN
  Administered 2014-01-22 – 2014-01-23 (×2): 2 mg via INTRAVENOUS
  Filled 2014-01-22 (×2): qty 1

## 2014-01-22 MED ORDER — LORAZEPAM 1 MG PO TABS: 1.0000 mg | ORAL_TABLET | Freq: Four times a day (QID) | ORAL | Status: DC | PRN

## 2014-01-22 MED ORDER — METOPROLOL TARTRATE 12.5 MG HALF TABLET
12.5000 mg | ORAL_TABLET | Freq: Two times a day (BID) | ORAL | Status: DC
  Filled 2014-01-22 (×4): qty 1

## 2014-01-22 MED ORDER — VALPROATE SODIUM 500 MG/5ML IV SOLN
500.0000 mg | Freq: Two times a day (BID) | INTRAVENOUS | Status: DC
  Administered 2014-01-22 – 2014-01-23 (×4): 500 mg via INTRAVENOUS
  Filled 2014-01-22 (×7): qty 5

## 2014-01-22 MED ORDER — FAMOTIDINE 20 MG PO TABS
20.0000 mg | ORAL_TABLET | Freq: Two times a day (BID) | ORAL | Status: DC
  Administered 2014-01-22 – 2014-01-23 (×4): 20 mg via ORAL
  Filled 2014-01-22 (×6): qty 1

## 2014-01-22 MED ORDER — LEVETIRACETAM 500 MG PO TABS
1000.0000 mg | ORAL_TABLET | Freq: Two times a day (BID) | ORAL | Status: DC
  Filled 2014-01-22 (×3): qty 2

## 2014-01-22 MED ORDER — LORAZEPAM 2 MG/ML IJ SOLN: 0.0000 mg | Freq: Four times a day (QID) | INTRAMUSCULAR | Status: DC

## 2014-01-22 NOTE — ED Notes (Signed)
Dr. Gardener at bedside. 

## 2014-01-22 NOTE — Progress Notes (Addendum)
Patient Demographics  Chase Blackwell, is a 49 y.o. male, DOB - 1965-04-30, ZOX:[id number]  Admit date - 01/21/2014   Admitting Physician Etta Quill, DO  Outpatient Primary MD for the patient is [place] PA  LOS - 1   Chief Complaint  Patient presents with  . Abdominal Pain        Subjective:   Chase Blackwell today has, No headache, No chest pain, No abdominal pain - No Nausea, No new weakness tingling or numbness, No Cough - SOB   Assessment & Plan    1.Alcoholic Recurrent  Acute pancreatitis - last alcohol 2 days ago, recent triglycerides stable, right upper quadrant ultrasound nonacute, pain improving, continue IV fluids and bowel rest. Counseled to quit alcohol.   Results for Chase, Blackwell (MRN [id number]) as of 01/22/2014 10:48   Ref. Range 01/17/2014 03:40  Cholesterol Latest Range: 0-200 mg/dL 106  Triglycerides Latest Range: <150 mg/dL 85  HDL Latest Range: >39 mg/dL 34 (L)  LDL (calc) Latest Range: 0-99 mg/dL 55  VLDL Latest Range: 0-40 mg/dL 17  Total CHOL/HDL Ratio Latest Units: RATIO 3.1     2. Alcohol abuse. Counseled to quit. CIWA protocol.   3. Dyslipidemia. Continue on home dose statin.   4. Essential hypertension. Continue Beta blocker & hydralazine.   5. History of seizures. Continue Depakote and Keppra.      6. History of paranoid schizophrenia. On BuSpar along with Seroquel.   7. Gall bladder polyp. Outpatient surgery follow-up.   8. CAD and Chronic diastolic CHF. EF 60% on recent echo. Compensated. Pain-free, continue aspirin and beta blocker statin. Monitor intake and output.     Code Status: Full  Family Communication: none  Disposition Plan: RUQ [place]   Procedures      Consults     Medications  Scheduled Meds: . aspirin  81 mg  Oral Daily  . atorvastatin  80 mg Oral QHS  . busPIRone  5 mg Oral BID  . clopidogrel  75 mg Oral Daily  . famotidine  20 mg Oral BID  . folic acid  1 mg Oral Daily  . heparin  5,000 Units Subcutaneous 3 times per day  . hydrALAZINE  10 mg Oral BID  . levETIRAcetam  1,000 mg Intravenous Q12H  . metoprolol tartrate  12.5 mg Oral BID  . modafinil  100 mg Oral Daily  . multivitamin  5 mL Oral Daily  . QUEtiapine  50 mg Oral QHS  . thiamine  100 mg Oral Daily  . valproate sodium  500 mg Intravenous Q12H   Continuous Infusions: . sodium chloride 125 mL/hr at 01/22/14 0853   PRN Meds:.albuterol, LORazepam, morphine injection, ondansetron (ZOFRAN) IV, promethazine, zolpidem  DVT Prophylaxis   Heparin   Lab Results  Component Value Date   PLT 271 01/22/2014    Antibiotics     Anti-infectives    None          Objective:   Filed Vitals:   01/22/14 0030 01/22/14 0045 01/22/14 0100 01/22/14 0600  BP: 125/77 126/78 131/88 100/56  Pulse: 56 64 60 52  Temp:    98.6 F (37 C)  Resp: 11   14  SpO2: 100% 100% 97% 97%  Wt Readings from Last 3 Encounters:  01/18/14 73.4 kg (161 lb 13.1 oz)  02/01/13 83.462 kg (184 lb)  07/31/08 107.162 kg (236 lb 4 oz)     Intake/Output Summary (Last 24 hours) at 01/22/14 1052 Last data filed at 01/22/14 0357  Gross per 24 hour  Intake   1000 ml  Output   2050 ml  Net  -1050 ml     Physical Exam  Awake Alert, Oriented X 3, No new F.N deficits, Normal affect [place].AT,PERRAL Supple Neck,No JVD, No cervical lymphadenopathy appriciated.  Symmetrical Chest wall movement, Good air movement bilaterally, CTAB RRR,No Gallops,Rubs or new Murmurs, No Parasternal Heave +ve B.Sounds, Abd Soft, No tenderness, No organomegaly appriciated, No rebound - guarding or rigidity. No Cyanosis, Clubbing or edema, No new Rash or bruise     Data Review   Micro Results No results found for this or any previous visit (from the past 240  hour(s)).  Radiology Reports      US Abdomen Limited Ruq  01/22/2014   CLINICAL DATA:  49 year old hypertensive male with abdominal pain and abnormal pancreas on recent CT. Subsequent encounter.  EXAM: US ABDOMEN LIMITED - RIGHT UPPER QUADRANT  COMPARISON:  01/12/2014 CT.  No comparison ultrasound.  FINDINGS: Gallbladder:  10.5 x 4.4 mm polyp versus adherent sludge ball. No shadowing as expected with gallstone. This can be assessed on follow up. No gallbladder wall thickening or pericholecystic fluid. Per ultrasound technologist, the patient was not tender over this region during scanning.  Common bile duct:  Diameter: Prominent at 8 mm without common bile duct stone visualized. The distal common bile duct is poorly delineated secondary to bowel gas.  Liver:  No focal lesion identified. Within normal limits in parenchymal echogenicity.  IMPRESSION: 10.5 x 4.4 mm polyp versus adherent sludge ball. No shadowing as expected with gallstone. This can be assessed on follow up.  Prominent common bile duct without common bile duct stone visualized. The distal portion of the common bile duct is not visualized secondary to bowel gas.   Electronically Signed   By: Chauncey Cruel M.D.   On: 01/22/2014 09:50     CBC  Recent Labs Lab 01/16/14 2008 01/17/14 0340 01/21/14 2226 01/22/14 0456  WBC 7.0 6.4 6.1 6.1  HGB 14.9 13.8 15.8 14.3  HCT 42.6 40.3 43.7 41.6  PLT 217 193 308 271  MCV 87.3 89.4 87.1 87.4  MCH 30.5 30.6 31.5 30.0  MCHC 35.0 34.2 36.2* 34.4  RDW 14.2 14.3 14.4 14.4  LYMPHSABS 2.0  --  2.4  --   MONOABS 0.7  --  0.6  --   EOSABS 0.2  --  0.3  --   BASOSABS 0.0  --  0.0  --     Chemistries   Recent Labs Lab 01/16/14 2008 01/17/14 0340 01/21/14 2226 01/22/14 0456  NA 138 144 137 141  K 3.7 3.8 3.4* 3.6  CL 96 111 97 107  CO2 28 26 34* 25  GLUCOSE 117* 114* 104* 99  BUN 5* <5* 8 7  CREATININE 0.89 0.75 1.11 0.82  CALCIUM 9.6 8.9 9.1 8.5  AST 12 10 16   --   ALT 9 7 12   --    ALKPHOS 62 57 66  --   BILITOT 0.6 0.5 0.5  --    ------------------------------------------------------------------------------------------------------------------ CrCl cannot be calculated (Unknown ideal weight.). ------------------------------------------------------------------------------------------------------------------ No results for input(s): HGBA1C in the last 72 hours. ------------------------------------------------------------------------------------------------------------------ No results for input(s): CHOL, HDL, LDLCALC, TRIG, CHOLHDL,  LDLDIRECT in the last 72 hours. ------------------------------------------------------------------------------------------------------------------ No results for input(s): TSH, T4TOTAL, T3FREE, THYROIDAB in the last 72 hours.  Invalid input(s): FREET3 ------------------------------------------------------------------------------------------------------------------ No results for input(s): VITAMINB12, FOLATE, FERRITIN, TIBC, IRON, RETICCTPCT in the last 72 hours.  Coagulation profile  Recent Labs Lab 01/17/14 0340  INR 1.03    No results for input(s): DDIMER in the last 72 hours.  Cardiac Enzymes No results for input(s): CKMB, TROPONINI, MYOGLOBIN in the last 168 hours.  Invalid input(s): CK ------------------------------------------------------------------------------------------------------------------ Invalid input(s): POCBNP     Time Spent in minutes  35   Lala Lund K M.D on 01/22/2014 at 10:52 AM  Between 7am to 7pm - Pager - [phone]  After 7pm go to [email or web address] - [facility] Office  [phone]

## 2014-01-22 NOTE — H&P (Signed)
[facility] History and Physical  Chase Blackwell ZOX:[id number] DOB: [birth year] DOA: 01/21/2014  Referring physician: EDP PCP: [facility]   Chief Complaint: Abdominal pain   HPI: Chase Blackwell is a 49 y.o. male with recent admission earlier last week for acute pancreatitis.  Patient was admitted on 1/13 and left AMA 1/15.  Today after eating a chili dog, his abdominal pain became worse.  He denies any EtOH use since discharge.  He returns to the ED for worsening abdominal pain and known acute pancreatitis.  On evaluation in the ED his lipase is even higher (now over 500).  Review of Systems: Systems reviewed.  As above, otherwise negative  Past Medical History  Diagnosis Date  . Hypertension   . Asthma   . GERD (gastroesophageal reflux disease)   . Chronic pain   . Schizophrenia   . Alcohol abuse   . Seizures   . Pancreatitis    Past Surgical History  Procedure Laterality Date  . Left heart catheterization with coronary angiogram N/A 07/06/2013    Procedure: LEFT HEART CATHETERIZATION WITH CORONARY ANGIOGRAM;  Surgeon: Clent Demark, MD;  Location: [facility] CATH LAB;  Service: Cardiovascular;  Laterality: N/A;  . Percutaneous coronary stent intervention (pci-s)  07/06/2013    Procedure: PERCUTANEOUS CORONARY STENT INTERVENTION (PCI-S);  Surgeon: Clent Demark, MD;  Location: [facility] CATH LAB;  Service: Cardiovascular;;   Social History:  reports that he has been smoking Cigarettes.  He has been smoking about 1.00 pack per day. He does not have any smokeless tobacco history on file. He reports that he drinks about 1.2 oz of alcohol per week. He reports that he uses illicit drugs (Marijuana).  Allergies  Allergen Reactions  . Hctz [Hydrochlorothiazide] Other (See Comments)    Dizzy spells  . Hydroxyzine Hives  . Sulfonamide Derivatives Hives  . Cetirizine & Related Other (See Comments)    unknown    Family History  Problem Relation Age of Onset  . Malignant hyperthermia  Mother   . Cirrhosis Father   . Alcohol abuse Father      Prior to Admission medications   Medication Sig Start Date End Date Taking? Authorizing Provider  albuterol (PROVENTIL HFA;VENTOLIN HFA) 108 (90 BASE) MCG/ACT inhaler Inhale 1-2 puffs into the lungs every 4 (four) hours as needed for wheezing or shortness of breath. 11/23/13   Kristen N Biederman, DO  aspirin 81 MG chewable tablet Chew 1 tablet (81 mg total) by mouth daily. 11/23/13   Kristen N Seminara, DO  atorvastatin (LIPITOR) 80 MG tablet Take 1 tablet (80 mg total) by mouth at bedtime. 11/23/13   Kristen N Niblett, DO  busPIRone (BUSPAR) 5 MG tablet Take 5 mg by mouth 2 (two) times daily.    Historical Provider, MD  clopidogrel (PLAVIX) 75 MG tablet Take 1 tablet (75 mg total) by mouth daily. 11/23/13   Kristen N Perkin, DO  divalproex (DEPAKOTE) 500 MG DR tablet Take 1 tablet (500 mg total) by mouth 2 (two) times daily. 11/23/13   Kristen N Sean, DO  famotidine (PEPCID) 20 MG tablet Take 1 tablet (20 mg total) by mouth 2 (two) times daily. 11/23/13   Kristen N Greeno, DO  folic acid (FOLVITE) 1 MG tablet Place 1 tablet (1 mg total) into feeding tube daily. Patient taking differently: Take 1 mg by mouth daily.  07/23/13   Donita Brooks, NP  furosemide (LASIX) 40 MG tablet Take 1 tablet (40 mg total) by mouth daily.  11/23/13   Kristen N Grzelak, DO  haloperidol decanoate (HALDOL DECANOATE) 50 MG/ML injection Inject 100 mg into the muscle every 28 (twenty-eight) days.    Historical Provider, MD  hydrALAZINE (APRESOLINE) 10 MG tablet Take 1 tablet (10 mg total) by mouth 2 (two) times daily. 11/23/13   Kristen N Rodriges, DO  levETIRAcetam (KEPPRA) 1000 MG tablet Take 1 tablet (1,000 mg total) by mouth 2 (two) times daily. 11/23/13   Kristen N Shonk, DO  lisinopril (PRINIVIL,ZESTRIL) 2.5 MG tablet Take 1 tablet (2.5 mg total) by mouth daily. 11/23/13   Kristen N Goonan, DO  LORazepam (ATIVAN) 0.5 MG tablet Take 0.5 mg by mouth every 8 (eight) hours as needed for  anxiety.    Historical Provider, MD  Melatonin 5 MG TABS Take 1 tablet by mouth at bedtime.    Historical Provider, MD  meloxicam (MOBIC) 15 MG tablet Take 1 tablet (15 mg total) by mouth daily. 11/13/13   Kaitlyn Szekalski, PA-C  metoprolol tartrate (LOPRESSOR) 25 MG tablet Take 0.5 tablets (12.5 mg total) by mouth 2 (two) times daily. 11/23/13   Kristen N Andy, DO  modafinil (PROVIGIL) 100 MG tablet Take one tablet by mouth every morning 08/30/13   Tiffany L Reed, DO  Multiple Vitamins-Minerals (CENTAMIN) LIQD Take 5 mLs by mouth daily.    Historical Provider, MD  Omega-3 Fatty Acids (SM FISH OIL) 1000 MG CAPS Take 6,000 mg by mouth daily.    Historical Provider, MD  oxyCODONE (ROXICODONE) 5 MG immediate release tablet Take one tablet by mouth twice daily for pain 10/01/13   Tiffany L Reed, DO  promethazine (PHENERGAN) 25 MG tablet Take 1 tablet (25 mg total) by mouth every 6 (six) hours as needed for nausea. 01/15/14   Nat Christen, MD  QUEtiapine (SEROQUEL) 50 MG tablet Take 1 tablet (50 mg total) by mouth at bedtime. 11/23/13   Kristen N Mackintosh, DO  thiamine 100 MG tablet Place 1 tablet (100 mg total) into feeding tube daily. Patient taking differently: Take 100 mg by mouth daily.  07/23/13   Donita Brooks, NP  zolpidem (AMBIEN) 5 MG tablet Take 1 tablet (5 mg total) by mouth at bedtime as needed for sleep. 08/30/13   Gayland Curry, DO   Physical Exam: Filed Vitals:   01/22/14 0030  BP: 125/77  Pulse: 56  Temp:   Resp: 11    BP 125/77 mmHg  Pulse 56  Temp(Src) 98.7 F (37.1 C)  Resp 11  SpO2 100%  General Appearance:    Alert, oriented, no distress, appears stated age  Head:    Normocephalic, atraumatic  Eyes:    PERRL, EOMI, sclera non-icteric        Nose:   Nares without drainage or epistaxis. Mucosa, turbinates normal  Throat:   Moist mucous membranes. Oropharynx without erythema or exudate.  Neck:   Supple. No carotid bruits.  No thyromegaly.  No lymphadenopathy.   Back:     No  CVA tenderness, no spinal tenderness  Lungs:     Clear to auscultation bilaterally, without wheezes, rhonchi or rales  Chest wall:    No tenderness to palpitation  Heart:    Regular rate and rhythm without murmurs, gallops, rubs  Abdomen:     Soft, non-tender, nondistended, normal bowel sounds, no organomegaly  Genitalia:    deferred  Rectal:    deferred  Extremities:   No clubbing, cyanosis or edema.  Pulses:   2+ and symmetric all extremities  Skin:   Skin color, texture, turgor normal, no rashes or lesions  Lymph nodes:   Cervical, supraclavicular, and axillary nodes normal  Neurologic:   CNII-XII intact. Normal strength, sensation and reflexes      throughout    Labs on Admission:  Basic Metabolic Panel:  Recent Labs Lab 01/15/14 1015 01/16/14 2008 01/17/14 0340 01/21/14 2226  NA 138 138 144 137  K 3.9 3.7 3.8 3.4*  CL 103 96 111 97  CO2 26 28 26  34*  GLUCOSE 134* 117* 114* 104*  BUN 9 5* <5* 8  CREATININE 0.88 0.89 0.75 1.11  CALCIUM 9.1 9.6 8.9 9.1   Liver Function Tests:  Recent Labs Lab 01/15/14 1015 01/16/14 2008 01/17/14 0340 01/21/14 2226  AST 16 12 10 16   ALT 10 9 7 12   ALKPHOS 66 62 57 66  BILITOT 0.8 0.6 0.5 0.5  PROT 6.6 6.5 5.6* 7.1  ALBUMIN 3.7 3.7 3.1* 3.7    Recent Labs Lab 01/15/14 1015 01/16/14 2008 01/18/14 0603 01/21/14 2226  LIPASE 127* 210* 58 515*   No results for input(s): AMMONIA in the last 168 hours. CBC:  Recent Labs Lab 01/15/14 1015 01/16/14 2008 01/17/14 0340 01/21/14 2226  WBC 6.8 7.0 6.4 6.1  NEUTROABS 4.1 4.1  --  2.8  HGB 15.8 14.9 13.8 15.8  HCT 44.9 42.6 40.3 43.7  MCV 89.4 87.3 89.4 87.1  PLT 199 217 193 308   Cardiac Enzymes: No results for input(s): CKTOTAL, CKMB, CKMBINDEX, TROPONINI in the last 168 hours.  BNP (last 3 results)  Recent Labs  02/01/13 1221 06/27/13 1933  PROBNP 192.7* 146.2*   CBG:  Recent Labs Lab 01/17/14 0740 01/18/14 0800  GLUCAP 87 70    Radiological Exams on  Admission: No results found.  EKG: Independently reviewed.  Assessment/Plan Principal Problem:   Acute pancreatitis Active Problems:   HYPERCHOLESTEROLEMIA   PARANOID SCHIZOPHRENIA, CHRONIC   Alcohol abuse   Essential hypertension   Seizures   CAD (coronary artery disease)   CHF (congestive heart failure)   1. Acute pancreatitis -  1. Hold lasix 2. IVF at 125 cc/hr 3. Repeat labs in AM 4. NPO for now 5. zofran and phenergan for nausea 6. Morphine PRN pain 2. H/o seizure disorder - continue home keppra and depakote 3. HTN - continue home meds, hold lasix 4. CHF - hold lasix 5. Hyperlipidemia - continue lipitor 6. EtOH abuse - no recent use per patient, continue folic acid and thiamine, watch for signs of withdrawal 7. CAD - continue ASA, lipitor, plavix    Code Status: Full Code  Family Communication: No family in room Disposition Plan: Admit to obs   Time spent: 70 min  Lidwina Kaner M. [facility] Pager [phone]  If 7AM-7PM, please contact the day team taking care of the patient [email or web address] Password [facility] 01/22/2014, 12:54 AM

## 2014-01-22 NOTE — Progress Notes (Signed)
UR completed 

## 2014-01-23 DIAGNOSIS — K852 Alcohol induced acute pancreatitis: Principal | ICD-10-CM

## 2014-01-23 LAB — COMPREHENSIVE METABOLIC PANEL
ALBUMIN: 3.1 g/dL — AB (ref 3.5–5.2)
ALT: 11 U/L (ref 0–53)
AST: 13 U/L (ref 0–37)
Alkaline Phosphatase: 53 U/L (ref 39–117)
Anion gap: 9 (ref 5–15)
BILIRUBIN TOTAL: 0.6 mg/dL (ref 0.3–1.2)
BUN: <5 mg/dL — ABNORMAL LOW (ref 6–23)
CHLORIDE: 106 meq/L (ref 96–112)
CO2: 29 mmol/L (ref 19–32)
CREATININE: 0.84 mg/dL (ref 0.50–1.35)
Calcium: 9 mg/dL (ref 8.4–10.5)
Glucose, Bld: 83 mg/dL (ref 70–99)
Potassium: 4.1 mmol/L (ref 3.5–5.1)
Sodium: 144 mmol/L (ref 135–145)
TOTAL PROTEIN: 5.8 g/dL — AB (ref 6.0–8.3)

## 2014-01-23 LAB — CBC
HEMATOCRIT: 41.2 % (ref 39.0–52.0)
Hemoglobin: 14 g/dL (ref 13.0–17.0)
MCH: 30.4 pg (ref 26.0–34.0)
MCHC: 34 g/dL (ref 30.0–36.0)
MCV: 89.6 fL (ref 78.0–100.0)
Platelets: 257 10*3/uL (ref 150–400)
RBC: 4.6 MIL/uL (ref 4.22–5.81)
RDW: 14.8 % (ref 11.5–15.5)
WBC: 5 10*3/uL (ref 4.0–10.5)

## 2014-01-23 LAB — LIPASE, BLOOD: LIPASE: 72 U/L — AB (ref 11–59)

## 2014-01-23 MED ORDER — CHLORDIAZEPOXIDE HCL 5 MG PO CAPS
5.0000 mg | ORAL_CAPSULE | Freq: Three times a day (TID) | ORAL | Status: DC
  Administered 2014-01-23 (×3): 5 mg via ORAL
  Filled 2014-01-23 (×3): qty 1

## 2014-01-23 MED ORDER — MORPHINE SULFATE 2 MG/ML IJ SOLN
1.0000 mg | INTRAMUSCULAR | Status: DC | PRN
  Administered 2014-01-23 – 2014-01-24 (×4): 1 mg via INTRAVENOUS
  Filled 2014-01-23 (×5): qty 1

## 2014-01-23 MED ORDER — METOPROLOL TARTRATE 12.5 MG HALF TABLET
12.5000 mg | ORAL_TABLET | Freq: Every day | ORAL | Status: DC
  Filled 2014-01-23 (×2): qty 1

## 2014-01-23 NOTE — Clinical Social Work Note (Signed)
CSW consulted regarding discharge disposition (SNF placement). CSW attempted to speak with patient's family regarding discharge disposition due to patient's orientation. Per patient's brother-in-law, Coralyn Mark, patient's mother currently resides in a Nursing Home. Patient's brother-in-law informed CSW patient's next of kin would be patient's brother, Edd Arbour [phone]). Per patient's brother-in-law, patient's brother currently unavailable to speak with CSW.  CSW to continue to follow and assist with discharge planning needs.  Barriers to discharge: Patient requires level 2 PASARR. MD to please sign FL2 and 30 day note.  Lubertha Sayres, [place] [phone]) Licensed Clinical Social Worker Orthopedics [phone]) and Surgical [phone])

## 2014-01-23 NOTE — Progress Notes (Signed)
Patient Demographics  Chase Blackwell, is a 49 y.o. male, DOB - 01/03/1966, UXN:[id number]  Admit date - 01/21/2014   Admitting Physician Etta Quill, DO  Outpatient Primary MD for the patient is Tyler Run PA  LOS - 2   Chief Complaint  Patient presents with  . Abdominal Pain        Subjective:   Chase Blackwell today has, No headache, No chest pain, No abdominal pain - No Nausea, No new weakness tingling or numbness, No Cough - SOB   Assessment & Plan    1.Alcoholic Recurrent  Acute pancreatitis - last alcohol 2 days ago, recent triglycerides stable, right upper quadrant ultrasound nonacute, continue IV fluids and clear liquid diet. Counseled to quit alcohol. Lipase has remarkably improved. Appears comfortable.   Results for Chase Blackwell (MRN [id number]) as of 01/22/2014 10:48   Ref. Range 01/17/2014 03:40  Cholesterol Latest Range: 0-200 mg/dL 106  Triglycerides Latest Range: <150 mg/dL 85  HDL Latest Range: >39 mg/dL 34 (L)  LDL (calc) Latest Range: 0-99 mg/dL 55  VLDL Latest Range: 0-40 mg/dL 17  Total CHOL/HDL Ratio Latest Units: RATIO 3.1     2. Alcohol abuse. Counseled to quit. CIWA protocol. Added schedule Librium as he is in mild DTs. Says last alcoholic drink was 4-5 days ago.   3. Dyslipidemia. Continue on home dose statin.   4. Essential hypertension. Continue Beta blocker & monitor.   5. History of seizures. Continue Depakote and Keppra.      6. History of paranoid schizophrenia. On BuSpar along with Seroquel.   7. Gall bladder polyp. Outpatient surgery follow-up.   8. CAD and Chronic diastolic CHF. EF 60% on recent echo. Compensated. Pain-free, continue aspirin and beta blocker statin. Monitor intake and output.     Code Status: Full  Family  Communication: none  Disposition Plan: TBD   Procedures   RUQ [place]  Consults     Medications  Scheduled Meds: . aspirin  81 mg Oral Daily  . atorvastatin  80 mg Oral QHS  . busPIRone  5 mg Oral BID  . chlordiazePOXIDE  5 mg Oral TID  . clopidogrel  75 mg Oral Daily  . famotidine  20 mg Oral BID  . folic acid  1 mg Oral Daily  . heparin  5,000 Units Subcutaneous 3 times per day  . levETIRAcetam  1,000 mg Intravenous Q12H  . LORazepam  0-4 mg Intravenous Q6H   Followed by  . [START ON 01/24/2014] LORazepam  0-4 mg Intravenous Q12H  . metoprolol tartrate  12.5 mg Oral BID  . modafinil  100 mg Oral Daily  . multivitamin with minerals  1 tablet Oral Daily  . QUEtiapine  50 mg Oral QHS  . thiamine  100 mg Oral Daily   Or  . thiamine  100 mg Intravenous Daily  . valproate sodium  500 mg Intravenous Q12H   Continuous Infusions: . sodium chloride 125 mL/hr at 01/22/14 0853   PRN Meds:.albuterol, LORazepam **OR** LORazepam, morphine injection, ondansetron (ZOFRAN) IV  DVT Prophylaxis   Heparin   Lab Results  Component Value Date   PLT 257 01/23/2014    Antibiotics     Anti-infectives    None  Objective:   Filed Vitals:   01/22/14 1212 01/22/14 1400 01/22/14 2200 01/23/14 0600  BP: 113/60 95/58 123/70 103/54  Pulse: 58 45 44 54  Temp:  98.4 F (36.9 C) 98.6 F (37 C) 97.5 F (36.4 C)  TempSrc:      Resp:  18 18 16   SpO2:  98% 100% 100%    Wt Readings from Last 3 Encounters:  01/18/14 73.4 kg (161 lb 13.1 oz)  02/01/13 83.462 kg (184 lb)  07/31/08 107.162 kg (236 lb 4 oz)     Intake/Output Summary (Last 24 hours) at 01/23/14 0944 Last data filed at 01/23/14 0900  Gross per 24 hour  Intake   2560 ml  Output   1250 ml  Net   1310 ml     Physical Exam  Awake , mild DTs with tremors, No new F.N deficits, Normal affect [place].AT,PERRAL Supple Neck,No JVD, No cervical lymphadenopathy appriciated.  Symmetrical Chest wall movement, Good air  movement bilaterally, CTAB RRR,No Gallops,Rubs or new Murmurs, No Parasternal Heave +ve B.Sounds, Abd Soft, No tenderness, No organomegaly appriciated, No rebound - guarding or rigidity. No Cyanosis, Clubbing or edema, No new Rash or bruise     Data Review   Micro Results No results found for this or any previous visit (from the past 240 hour(s)).  Radiology Reports      US Abdomen Limited Ruq  01/22/2014   CLINICAL DATA:  49 year old hypertensive male with abdominal pain and abnormal pancreas on recent CT. Subsequent encounter.  EXAM: US ABDOMEN LIMITED - RIGHT UPPER QUADRANT  COMPARISON:  01/12/2014 CT.  No comparison ultrasound.  FINDINGS: Gallbladder:  10.5 x 4.4 mm polyp versus adherent sludge ball. No shadowing as expected with gallstone. This can be assessed on follow up. No gallbladder wall thickening or pericholecystic fluid. Per ultrasound technologist, the patient was not tender over this region during scanning.  Common bile duct:  Diameter: Prominent at 8 mm without common bile duct stone visualized. The distal common bile duct is poorly delineated secondary to bowel gas.  Liver:  No focal lesion identified. Within normal limits in parenchymal echogenicity.  IMPRESSION: 10.5 x 4.4 mm polyp versus adherent sludge ball. No shadowing as expected with gallstone. This can be assessed on follow up.  Prominent common bile duct without common bile duct stone visualized. The distal portion of the common bile duct is not visualized secondary to bowel gas.   Electronically Signed   By: Chauncey Cruel M.D.   On: 01/22/2014 09:50     CBC  Recent Labs Lab 01/16/14 2008 01/17/14 0340 01/21/14 2226 01/22/14 0456 01/23/14 0750  WBC 7.0 6.4 6.1 6.1 5.0  HGB 14.9 13.8 15.8 14.3 14.0  HCT 42.6 40.3 43.7 41.6 41.2  PLT 217 193 308 271 257  MCV 87.3 89.4 87.1 87.4 89.6  MCH 30.5 30.6 31.5 30.0 30.4  MCHC 35.0 34.2 36.2* 34.4 34.0  RDW 14.2 14.3 14.4 14.4 14.8  LYMPHSABS 2.0  --  2.4  --   --    MONOABS 0.7  --  0.6  --   --   EOSABS 0.2  --  0.3  --   --   BASOSABS 0.0  --  0.0  --   --     Chemistries   Recent Labs Lab 01/16/14 2008 01/17/14 0340 01/21/14 2226 01/22/14 0456 01/23/14 0750  NA 138 144 137 141 144  K 3.7 3.8 3.4* 3.6 4.1  CL 96 111 97 107 106  CO2 28 26 34* 25 29  GLUCOSE 117* 114* 104* 99 83  BUN 5* <5* 8 7 <5*  CREATININE 0.89 0.75 1.11 0.82 0.84  CALCIUM 9.6 8.9 9.1 8.5 9.0  AST 12 10 16   --  13  ALT 9 7 12   --  11  ALKPHOS 62 57 66  --  53  BILITOT 0.6 0.5 0.5  --  0.6   ------------------------------------------------------------------------------------------------------------------ CrCl cannot be calculated (Unknown ideal weight.). ------------------------------------------------------------------------------------------------------------------ No results for input(s): HGBA1C in the last 72 hours. ------------------------------------------------------------------------------------------------------------------ No results for input(s): CHOL, HDL, LDLCALC, TRIG, CHOLHDL, LDLDIRECT in the last 72 hours. ------------------------------------------------------------------------------------------------------------------ No results for input(s): TSH, T4TOTAL, T3FREE, THYROIDAB in the last 72 hours.  Invalid input(s): FREET3 ------------------------------------------------------------------------------------------------------------------ No results for input(s): VITAMINB12, FOLATE, FERRITIN, TIBC, IRON, RETICCTPCT in the last 72 hours.  Coagulation profile  Recent Labs Lab 01/17/14 0340  INR 1.03    No results for input(s): DDIMER in the last 72 hours.  Cardiac Enzymes No results for input(s): CKMB, TROPONINI, MYOGLOBIN in the last 168 hours.  Invalid input(s): CK ------------------------------------------------------------------------------------------------------------------ Invalid input(s): POCBNP     Time Spent in minutes   35   Shima Compere K M.D on 01/23/2014 at 9:44 AM  Between 7am to 7pm - Pager - [phone]  After 7pm go to [email or web address] - [facility] Office  [phone]

## 2014-01-23 NOTE — Progress Notes (Signed)
01/23/14 PT recommended SNF. Referral made to CSW. Will continue to follow for d/c needs.

## 2014-01-23 NOTE — Evaluation (Signed)
Physical Therapy Evaluation Patient Details Name: Chase Blackwell MRN: [id number] DOB: [birth year] Today's Date: 01/23/2014   History of Present Illness  Pt is a 49 y.o. male adm due to acute pancreatitis.  Clinical Impression  Pt adm due to above. Pt presents with decreased independence with mobility and balance deficits at this time. Unable to complete DGI due to pt become anxious in hallway and requiring return to room. Pt denies having 24/7 (A) at home, may need SNF for 24/7 (A) and post acute rehab. Pt reports multiple falls at home but is a questionable historian at this time. Pt to benefit from skilled acute PT to address deficits and maximize functional mobility.   Follow Up Recommendations SNF;Supervision/Assistance - 24 hour    Equipment Recommendations  Other (comment) (TBD)    Recommendations for Other Services OT consult     Precautions / Restrictions Precautions Precautions: Fall Precaution Comments: pt reports falls at home Restrictions Weight Bearing Restrictions: No      Mobility  Bed Mobility Overal bed mobility: Modified Independent             General bed mobility comments: HOB elevated and relying on handrails  Transfers Overall transfer level: Needs assistance Equipment used: 1 person hand held assist Transfers: Sit to/from Stand Sit to Stand: Min guard         General transfer comment: + sway with standing; cues for safety  Ambulation/Gait Ambulation/Gait assistance: Min assist Ambulation Distance (Feet): 80 Feet Assistive device: 1 person hand held assist Gait Pattern/deviations: Step-through pattern;Decreased stride length;Narrow base of support;Staggering left;Staggering right;Shuffle Gait velocity: decr Gait velocity interpretation: Below normal speed for age/gender General Gait Details: pt with short shuffled gt; LOB when looking up; challenged with high level balance activities and then pt became very anxious in hallway and required  return to room   Stairs            Wheelchair Mobility    Modified Rankin (Stroke Patients Only)       Balance Overall balance assessment: Needs assistance;History of Falls Sitting-balance support: Feet supported;No upper extremity supported Sitting balance-Leahy Scale: Fair Sitting balance - Comments: denied dizziness sitting EOB   Standing balance support: During functional activity;Single extremity supported Standing balance-Leahy Scale: Poor Standing balance comment: + sway with sit to stand; handheld (A) provided to balance              High level balance activites: Head turns;Sudden stops;Direction changes;Backward walking;Turns High Level Balance Comments: staggered LOB steps; handheld (A) to balance  Standardized Balance Assessment Standardized Balance Assessment :  (began DGI, unable to complete)           Pertinent Vitals/Pain Pain Assessment: 0-10 Pain Score: 5  Pain Location: RT UQ of abdomen Pain Descriptors / Indicators: Aching Pain Intervention(s): Monitored during session;Premedicated before session;Repositioned    Home Living Family/patient expects to be discharged to:: Private residence Living Arrangements: Other relatives (brother) Available Help at Discharge: Family;Available PRN/intermittently Type of Home: House Home Access: Stairs to enter Entrance Stairs-Rails: None Entrance Stairs-Number of Steps: 2 Home Layout: One level Home Equipment: None Additional Comments: no family present during session     Prior Function Level of Independence: Independent         Comments: pt reports he has had multiple falls at home     Hand Dominance        Extremity/Trunk Assessment   Upper Extremity Assessment: Defer to OT evaluation (c/o bil UE numbness)  Lower Extremity Assessment: Generalized weakness (bil LE tremors noted)      Cervical / Trunk Assessment: Normal  Communication   Communication: Expressive  difficulties  Cognition Arousal/Alertness: Awake/alert Behavior During Therapy: Flat affect Overall Cognitive Status: No family/caregiver present to determine baseline cognitive functioning Area of Impairment: Orientation;Memory;Following commands;Safety/judgement;Problem solving;Awareness Orientation Level: Disoriented to;Time;Situation   Memory: Decreased short-term memory Following Commands: Follows one step commands with increased time Safety/Judgement: Decreased awareness of deficits;Decreased awareness of safety Awareness: Intellectual Problem Solving: Requires verbal cues;Requires tactile cues;Difficulty sequencing;Decreased initiation;Slow processing General Comments: no family present; pt unaware as to why he was in the hospital; very delayed responses     General Comments General comments (skin integrity, edema, etc.): unable to complete DGI, pt anxious and requesting return to room    Exercises        Assessment/Plan    PT Assessment Patient needs continued PT services  PT Diagnosis Abnormality of gait;Generalized weakness;Altered mental status   PT Problem List Decreased strength;Decreased activity tolerance;Decreased balance;Decreased mobility;Decreased cognition;Decreased safety awareness;Pain  PT Treatment Interventions DME instruction;Gait training;Stair training;Functional mobility training;Therapeutic activities;Therapeutic exercise;Balance training;Neuromuscular re-education;Patient/family education   PT Goals (Current goals can be found in the Care Plan section) Acute Rehab PT Goals Patient Stated Goal: to talk to my brother PT Goal Formulation: With patient Time For Goal Achievement: 01/30/14 Potential to Achieve Goals: Good    Frequency Min 3X/week   Barriers to discharge Decreased caregiver support brother "in and out" per pt    Co-evaluation               End of Session Equipment Utilized During Treatment: Gait belt Activity Tolerance:  Patient limited by fatigue;Other (comment) (became anxious) Patient left: in bed;with call bell/phone within reach;with bed alarm set Nurse Communication: Mobility status;Precautions         Time: 4076-8088 PT Time Calculation (min) (ACUTE ONLY): 14 min   Charges:   PT Evaluation $Initial PT Evaluation Tier I: 1 Procedure PT Treatments $Gait Training: 8-22 mins   PT G CodesGustavus Bryant, [place]  [phone] 01/23/2014, 11:19 AM

## 2014-01-24 MED ORDER — LEVETIRACETAM 500 MG PO TABS
1000.0000 mg | ORAL_TABLET | Freq: Two times a day (BID) | ORAL | Status: DC
  Filled 2014-01-24 (×2): qty 2

## 2014-01-24 MED ORDER — DIVALPROEX SODIUM 500 MG PO DR TAB
500.0000 mg | DELAYED_RELEASE_TABLET | Freq: Two times a day (BID) | ORAL | Status: DC
  Filled 2014-01-24 (×2): qty 1

## 2014-01-24 NOTE — Progress Notes (Signed)
Patient Demographics  Chase Blackwell, is a 49 y.o. male, DOB - 1965/02/07, NOM:[id number]  Admit date - 01/21/2014   Admitting Physician Etta Quill, DO  Outpatient Primary MD for the patient is [place] [facility]  LOS - 3   Chief Complaint  Patient presents with  . Abdominal Pain        Subjective:   Chase Blackwell today has, No headache, No chest pain, No abdominal pain - No Nausea, No new weakness tingling or numbness, No Cough - SOB   Assessment & Plan    1.Alcoholic Recurrent  Acute pancreatitis - last alcohol 2 days ago, recent triglycerides stable, right upper quadrant ultrasound nonacute, continue IV fluids and advance diet. Counseled to quit alcohol. Lipase has remarkably improved. Appears comfortable.   Results for Chase, Blackwell (MRN [id number]) as of 01/22/2014 10:48   Ref. Range 01/17/2014 03:40  Cholesterol Latest Range: 0-200 mg/dL 106  Triglycerides Latest Range: <150 mg/dL 85  HDL Latest Range: >39 mg/dL 34 (L)  LDL (calc) Latest Range: 0-99 mg/dL 55  VLDL Latest Range: 0-40 mg/dL 17  Total CHOL/HDL Ratio Latest Units: RATIO 3.1     2. Alcohol abuse. Counseled to quit. CIWA protocol. Added schedule Librium as he is in mild DTs. Says last alcoholic drink was 4-5 days ago.   3. Dyslipidemia. Continue on home dose statin.   4. Essential hypertension. Blood pressure and heart rate on the lower side stop beta blocker.   5. History of seizures. Continue Depakote and Keppra.      6. History of paranoid schizophrenia. On BuSpar along with Seroquel.   7. Gall bladder polyp. Outpatient surgery follow-up.   8. CAD and Chronic diastolic CHF. EF 60% on recent echo. Compensated. Pain-free, continue aspirin and statin. Monitor intake and output. Beta blocker stopped due to  borderline low blood pressures.     Code Status: Full  Family Communication: none  Disposition Plan: TBD   Procedures   RUQ [place]  Consults     Medications  Scheduled Meds: . aspirin  81 mg Oral Daily  . atorvastatin  80 mg Oral QHS  . busPIRone  5 mg Oral BID  . chlordiazePOXIDE  5 mg Oral TID  . clopidogrel  75 mg Oral Daily  . famotidine  20 mg Oral BID  . folic acid  1 mg Oral Daily  . heparin  5,000 Units Subcutaneous 3 times per day  . levETIRAcetam  1,000 mg Intravenous Q12H  . LORazepam  0-4 mg Intravenous Q6H   Followed by  . LORazepam  0-4 mg Intravenous Q12H  . metoprolol tartrate  12.5 mg Oral Daily  . modafinil  100 mg Oral Daily  . multivitamin with minerals  1 tablet Oral Daily  . QUEtiapine  50 mg Oral QHS  . thiamine  100 mg Oral Daily   Or  . thiamine  100 mg Intravenous Daily  . valproate sodium  500 mg Intravenous Q12H   Continuous Infusions: . sodium chloride 75 mL/hr at 01/24/14 0455   PRN Meds:.albuterol, LORazepam **OR** LORazepam, morphine injection, ondansetron (ZOFRAN) IV  DVT Prophylaxis   Heparin   Lab Results  Component Value Date   PLT 257 01/23/2014    Antibiotics  Anti-infectives    None          Objective:   Filed Vitals:   01/23/14 0900 01/23/14 1246 01/23/14 2152 01/24/14 0600  BP: 93/52 102/60 110/53 102/70  Pulse: 51 55 59 55  Temp:  97.4 F (36.3 C) 97.6 F (36.4 C) 97.5 F (36.4 C)  TempSrc:      Resp:  16 16 16   SpO2:  96% 96% 98%    Wt Readings from Last 3 Encounters:  01/18/14 73.4 kg (161 lb 13.1 oz)  02/01/13 83.462 kg (184 lb)  07/31/08 107.162 kg (236 lb 4 oz)     Intake/Output Summary (Last 24 hours) at 01/24/14 0940 Last data filed at 01/24/14 0700  Gross per 24 hour  Intake   1800 ml  Output   5650 ml  Net  -3850 ml     Physical Exam  Awake , mild DTs with tremors, No new F.N deficits, Normal affect [place].AT,PERRAL Supple Neck,No JVD, No cervical lymphadenopathy  appriciated.  Symmetrical Chest wall movement, Good air movement bilaterally, CTAB RRR,No Gallops,Rubs or new Murmurs, No Parasternal Heave +ve B.Sounds, Abd Soft, No tenderness, No organomegaly appriciated, No rebound - guarding or rigidity. No Cyanosis, Clubbing or edema, No new Rash or bruise     Data Review   Micro Results No results found for this or any previous visit (from the past 240 hour(s)).  Radiology Reports      US Abdomen Limited Ruq  01/22/2014   CLINICAL DATA:  49 year old hypertensive male with abdominal pain and abnormal pancreas on recent CT. Subsequent encounter.  EXAM: US ABDOMEN LIMITED - RIGHT UPPER QUADRANT  COMPARISON:  01/12/2014 CT.  No comparison ultrasound.  FINDINGS: Gallbladder:  10.5 x 4.4 mm polyp versus adherent sludge ball. No shadowing as expected with gallstone. This can be assessed on follow up. No gallbladder wall thickening or pericholecystic fluid. Per ultrasound technologist, the patient was not tender over this region during scanning.  Common bile duct:  Diameter: Prominent at 8 mm without common bile duct stone visualized. The distal common bile duct is poorly delineated secondary to bowel gas.  Liver:  No focal lesion identified. Within normal limits in parenchymal echogenicity.    IMPRESSION: 10.5 x 4.4 mm polyp versus adherent sludge ball. No shadowing as expected with gallstone. This can be assessed on follow up.  Prominent common bile duct without common bile duct stone visualized. The distal portion of the common bile duct is not visualized secondary to bowel gas.   Electronically Signed   By: Chauncey Cruel M.D.   On: 01/22/2014 09:50     CBC  Recent Labs Lab 01/21/14 2226 01/22/14 0456 01/23/14 0750  WBC 6.1 6.1 5.0  HGB 15.8 14.3 14.0  HCT 43.7 41.6 41.2  PLT 308 271 257  MCV 87.1 87.4 89.6  MCH 31.5 30.0 30.4  MCHC 36.2* 34.4 34.0  RDW 14.4 14.4 14.8  LYMPHSABS 2.4  --   --   MONOABS 0.6  --   --   EOSABS 0.3  --   --     BASOSABS 0.0  --   --     Chemistries   Recent Labs Lab 01/21/14 2226 01/22/14 0456 01/23/14 0750  NA 137 141 144  K 3.4* 3.6 4.1  CL 97 107 106  CO2 34* 25 29  GLUCOSE 104* 99 83  BUN 8 7 <5*  CREATININE 1.11 0.82 0.84  CALCIUM 9.1 8.5 9.0  AST 16  --  13  ALT 12  --  11  ALKPHOS 66  --  53  BILITOT 0.5  --  0.6   ------------------------------------------------------------------------------------------------------------------ CrCl cannot be calculated (Unknown ideal weight.). ------------------------------------------------------------------------------------------------------------------ No results for input(s): HGBA1C in the last 72 hours. ------------------------------------------------------------------------------------------------------------------ No results for input(s): CHOL, HDL, LDLCALC, TRIG, CHOLHDL, LDLDIRECT in the last 72 hours. ------------------------------------------------------------------------------------------------------------------ No results for input(s): TSH, T4TOTAL, T3FREE, THYROIDAB in the last 72 hours.  Invalid input(s): FREET3 ------------------------------------------------------------------------------------------------------------------ No results for input(s): VITAMINB12, FOLATE, FERRITIN, TIBC, IRON, RETICCTPCT in the last 72 hours.  Coagulation profile No results for input(s): INR, PROTIME in the last 168 hours.  No results for input(s): DDIMER in the last 72 hours.  Cardiac Enzymes No results for input(s): CKMB, TROPONINI, MYOGLOBIN in the last 168 hours.  Invalid input(s): CK ------------------------------------------------------------------------------------------------------------------ Invalid input(s): POCBNP     Time Spent in minutes  35   Riniyah Speich K M.D on 01/24/2014 at 9:40 AM  Between 7am to 7pm - Pager - [phone]  After 7pm go to [email or web address] - [facility] Office   [phone]

## 2014-01-24 NOTE — Discharge Summary (Signed)
Patient at this time expresses desire to leave the Hospital immidiately, he has left AMA twice in the last few weeks, patient has been warned that this is not Medically advisable at this time, and can result in Medical complications like Death and Disability, patient understands and accepts the risks involved and assumes full responsibilty of this decision. My last progress note from earlier this morning below.  Thurnell Lose M.D on 01/24/2014 at 10:35 AM  Between 7am to 7pm - Pager - [phone], After 7pm go to [email or web address] - password [email or web address]  And look for the night coverage person covering me after hours  [place]  [phone]                                                                                                                                                                                                  Patient Demographics  Chase Blackwell, is a 49 y.o. male, DOB - 16-Jul-1965, DTO:[id number]  Admit date - 01/21/2014   Admitting Physician Etta Quill, DO  Outpatient Primary MD for the patient is [place] [facility]  LOS - 3   Chief Complaint  Patient presents with  . Abdominal Pain        Subjective:   Diamante Bohorquez today has, No headache, No chest pain, No abdominal pain - No Nausea, No new weakness tingling or numbness, No Cough - SOB   Assessment & Plan    1.Alcoholic Recurrent  Acute pancreatitis - last alcohol 2 days ago, recent triglycerides stable, right upper quadrant ultrasound nonacute, continue IV fluids and advance diet. Counseled to quit alcohol. Lipase has remarkably improved. Appears comfortable.   Results for JURIEL, CID (MRN [id number]) as of 01/22/2014 10:48   Ref. Range 01/17/2014 03:40  Cholesterol Latest Range: 0-200 mg/dL 106  Triglycerides Latest Range: <150 mg/dL 85  HDL Latest Range: >39 mg/dL 34 (L)  LDL (calc) Latest Range: 0-99 mg/dL 55  VLDL Latest Range: 0-40 mg/dL 17  Total CHOL/HDL Ratio Latest  Units: RATIO 3.1     2. Alcohol abuse. Counseled to quit. CIWA protocol. Added schedule Librium as he is in mild DTs. Says last alcoholic drink was 4-5 days ago.   3. Dyslipidemia. Continue on home dose statin.   4. Essential hypertension. Blood pressure and heart rate on the lower side stop beta blocker.   5. History of seizures. Continue Depakote and Keppra.      6. History of paranoid schizophrenia. On BuSpar along with Seroquel.   7. Gall bladder polyp. Outpatient surgery follow-up.   8. CAD and Chronic diastolic CHF.  EF 60% on recent echo. Compensated. Pain-free, continue aspirin and statin. Monitor intake and output. Beta blocker stopped due to borderline low blood pressures.     Code Status: Full  Family Communication: none  Disposition Plan: TBD   Procedures   RUQ [place]  Consults     Medications  Scheduled Meds: . aspirin  81 mg Oral Daily  . atorvastatin  80 mg Oral QHS  . busPIRone  5 mg Oral BID  . chlordiazePOXIDE  5 mg Oral TID  . clopidogrel  75 mg Oral Daily  . divalproex  500 mg Oral Q12H  . famotidine  20 mg Oral BID  . folic acid  1 mg Oral Daily  . heparin  5,000 Units Subcutaneous 3 times per day  . levETIRAcetam  1,000 mg Oral BID  . LORazepam  0-4 mg Intravenous Q6H   Followed by  . LORazepam  0-4 mg Intravenous Q12H  . modafinil  100 mg Oral Daily  . multivitamin with minerals  1 tablet Oral Daily  . QUEtiapine  50 mg Oral QHS  . thiamine  100 mg Oral Daily   Continuous Infusions: . sodium chloride 75 mL/hr at 01/24/14 0455   PRN Meds:.albuterol, LORazepam **OR** LORazepam, morphine injection, ondansetron (ZOFRAN) IV  DVT Prophylaxis   Heparin   Lab Results  Component Value Date   PLT 257 01/23/2014    Antibiotics     Anti-infectives    None          Objective:   Filed Vitals:   01/23/14 0900 01/23/14 1246 01/23/14 2152 01/24/14 0600  BP: 93/52 102/60 110/53 102/70  Pulse: 51 55 59 55  Temp:  97.4 F (36.3  C) 97.6 F (36.4 C) 97.5 F (36.4 C)  TempSrc:      Resp:  16 16 16   SpO2:  96% 96% 98%    Wt Readings from Last 3 Encounters:  01/18/14 73.4 kg (161 lb 13.1 oz)  02/01/13 83.462 kg (184 lb)  07/31/08 107.162 kg (236 lb 4 oz)     Intake/Output Summary (Last 24 hours) at 01/24/14 1035 Last data filed at 01/24/14 0700  Gross per 24 hour  Intake   1800 ml  Output   5650 ml  Net  -3850 ml     Physical Exam  Awake , mild DTs with tremors, No new F.N deficits, Normal affect [place].AT,PERRAL Supple Neck,No JVD, No cervical lymphadenopathy appriciated.  Symmetrical Chest wall movement, Good air movement bilaterally, CTAB RRR,No Gallops,Rubs or new Murmurs, No Parasternal Heave +ve B.Sounds, Abd Soft, No tenderness, No organomegaly appriciated, No rebound - guarding or rigidity. No Cyanosis, Clubbing or edema, No new Rash or bruise     Data Review   Micro Results No results found for this or any previous visit (from the past 240 hour(s)).  Radiology Reports      US Abdomen Limited Ruq  01/22/2014   CLINICAL DATA:  50 year old hypertensive male with abdominal pain and abnormal pancreas on recent CT. Subsequent encounter.  EXAM: US ABDOMEN LIMITED - RIGHT UPPER QUADRANT  COMPARISON:  01/12/2014 CT.  No comparison ultrasound.  FINDINGS: Gallbladder:  10.5 x 4.4 mm polyp versus adherent sludge ball. No shadowing as expected with gallstone. This can be assessed on follow up. No gallbladder wall thickening or pericholecystic fluid. Per ultrasound technologist, the patient was not tender over this region during scanning.  Common bile duct:  Diameter: Prominent at 8 mm without common bile duct stone visualized. The distal common  bile duct is poorly delineated secondary to bowel gas.  Liver:  No focal lesion identified. Within normal limits in parenchymal echogenicity.    IMPRESSION: 10.5 x 4.4 mm polyp versus adherent sludge ball. No shadowing as expected with gallstone. This can be  assessed on follow up.  Prominent common bile duct without common bile duct stone visualized. The distal portion of the common bile duct is not visualized secondary to bowel gas.   Electronically Signed   By: Chauncey Cruel M.D.   On: 01/22/2014 09:50     CBC  Recent Labs Lab 01/21/14 2226 01/22/14 0456 01/23/14 0750  WBC 6.1 6.1 5.0  HGB 15.8 14.3 14.0  HCT 43.7 41.6 41.2  PLT 308 271 257  MCV 87.1 87.4 89.6  MCH 31.5 30.0 30.4  MCHC 36.2* 34.4 34.0  RDW 14.4 14.4 14.8  LYMPHSABS 2.4  --   --   MONOABS 0.6  --   --   EOSABS 0.3  --   --   BASOSABS 0.0  --   --     Chemistries   Recent Labs Lab 01/21/14 2226 01/22/14 0456 01/23/14 0750  NA 137 141 144  K 3.4* 3.6 4.1  CL 97 107 106  CO2 34* 25 29  GLUCOSE 104* 99 83  BUN 8 7 <5*  CREATININE 1.11 0.82 0.84  CALCIUM 9.1 8.5 9.0  AST 16  --  13  ALT 12  --  11  ALKPHOS 66  --  53  BILITOT 0.5  --  0.6   ------------------------------------------------------------------------------------------------------------------ CrCl cannot be calculated (Unknown ideal weight.). ------------------------------------------------------------------------------------------------------------------ No results for input(s): HGBA1C in the last 72 hours. ------------------------------------------------------------------------------------------------------------------ No results for input(s): CHOL, HDL, LDLCALC, TRIG, CHOLHDL, LDLDIRECT in the last 72 hours. ------------------------------------------------------------------------------------------------------------------ No results for input(s): TSH, T4TOTAL, T3FREE, THYROIDAB in the last 72 hours.  Invalid input(s): FREET3 ------------------------------------------------------------------------------------------------------------------ No results for input(s): VITAMINB12, FOLATE, FERRITIN, TIBC, IRON, RETICCTPCT in the last 72 hours.  Coagulation profile No results for input(s): INR,  PROTIME in the last 168 hours.  No results for input(s): DDIMER in the last 72 hours.  Cardiac Enzymes No results for input(s): CKMB, TROPONINI, MYOGLOBIN in the last 168 hours.  Invalid input(s): CK ------------------------------------------------------------------------------------------------------------------ Invalid input(s): POCBNP     Time Spent in minutes  35   SINGH,PRASHANT K M.D on 01/24/2014 at 10:35 AM  Between 7am to 7pm - Pager - [phone]  After 7pm go to [email or web address] - [facility] Office  [phone]

## 2014-02-09 ENCOUNTER — Emergency Department (HOSPITAL_COMMUNITY)
Admission: EM | Admit: 2014-02-09 | Discharge: 2014-02-09 | Discharge status: Against Medical Advice | Payer: Medicare Other | Attending: Emergency Medicine | Admitting: Emergency Medicine

## 2014-02-09 ENCOUNTER — Encounter (HOSPITAL_COMMUNITY): Payer: Self-pay | Admitting: Emergency Medicine

## 2014-02-09 DIAGNOSIS — Z720 Tobacco use: Secondary | ICD-10-CM | POA: Diagnosis not present

## 2014-02-09 DIAGNOSIS — R51 Headache: Secondary | ICD-10-CM | POA: Insufficient documentation

## 2014-02-09 DIAGNOSIS — R110 Nausea: Secondary | ICD-10-CM | POA: Diagnosis not present

## 2014-02-09 DIAGNOSIS — J45909 Unspecified asthma, uncomplicated: Secondary | ICD-10-CM | POA: Diagnosis not present

## 2014-02-09 DIAGNOSIS — G8929 Other chronic pain: Secondary | ICD-10-CM | POA: Insufficient documentation

## 2014-02-09 DIAGNOSIS — I10 Essential (primary) hypertension: Secondary | ICD-10-CM | POA: Insufficient documentation

## 2014-02-09 DIAGNOSIS — R1030 Lower abdominal pain, unspecified: Secondary | ICD-10-CM | POA: Insufficient documentation

## 2014-02-09 LAB — CBC
HCT: 40.6 % (ref 39.0–52.0)
Hemoglobin: 14.2 g/dL (ref 13.0–17.0)
MCH: 30.9 pg (ref 26.0–34.0)
MCHC: 35 g/dL (ref 30.0–36.0)
MCV: 88.5 fL (ref 78.0–100.0)
PLATELETS: 240 10*3/uL (ref 150–400)
RBC: 4.59 MIL/uL (ref 4.22–5.81)
RDW: 14.9 % (ref 11.5–15.5)
WBC: 6.9 10*3/uL (ref 4.0–10.5)

## 2014-02-09 LAB — BASIC METABOLIC PANEL
Anion gap: 7 (ref 5–15)
BUN: 7 mg/dL (ref 6–23)
CHLORIDE: 104 mmol/L (ref 96–112)
CO2: 29 mmol/L (ref 19–32)
CREATININE: 1 mg/dL (ref 0.50–1.35)
Calcium: 9.2 mg/dL (ref 8.4–10.5)
GFR calc non Af Amer: 88 mL/min — ABNORMAL LOW (ref >90)
Glucose, Bld: 139 mg/dL — ABNORMAL HIGH (ref 70–99)
Potassium: 3.9 mmol/L (ref 3.5–5.1)
SODIUM: 140 mmol/L (ref 135–145)

## 2014-02-09 LAB — I-STAT TROPONIN, ED: Troponin i, poc: 0 ng/mL (ref 0.00–0.08)

## 2014-02-09 NOTE — ED Notes (Signed)
Called for pt with no answer x 3

## 2014-02-09 NOTE — ED Notes (Signed)
Pt c/o lower abdominal pain and headache for past couple days. Pt reports nausea.

## 2014-02-09 NOTE — ED Notes (Signed)
The pt came to [facility] and said he doesn't want to wait, he does not feel that he should have come to the ER. Encouraged pt to stay for medical screening and informed him that we were cleaning a bed for him.I am unable to locate the patient for room now

## 2014-02-09 NOTE — ED Notes (Signed)
Unable to locate patient at this time.

## 2014-02-13 ENCOUNTER — Emergency Department (HOSPITAL_COMMUNITY)
Admission: EM | Admit: 2014-02-13 | Discharge: 2014-02-13 | Disposition: A | Discharge status: Home / Self Care | Payer: Medicare Other | Attending: Emergency Medicine | Admitting: Emergency Medicine

## 2014-02-13 ENCOUNTER — Encounter (HOSPITAL_COMMUNITY): Payer: Self-pay | Admitting: Neurology

## 2014-02-13 DIAGNOSIS — Z79899 Other long term (current) drug therapy: Secondary | ICD-10-CM | POA: Insufficient documentation

## 2014-02-13 DIAGNOSIS — Z720 Tobacco use: Secondary | ICD-10-CM | POA: Diagnosis not present

## 2014-02-13 DIAGNOSIS — Z7982 Long term (current) use of aspirin: Secondary | ICD-10-CM | POA: Diagnosis not present

## 2014-02-13 DIAGNOSIS — Z791 Long term (current) use of non-steroidal anti-inflammatories (NSAID): Secondary | ICD-10-CM | POA: Insufficient documentation

## 2014-02-13 DIAGNOSIS — K219 Gastro-esophageal reflux disease without esophagitis: Secondary | ICD-10-CM | POA: Insufficient documentation

## 2014-02-13 DIAGNOSIS — I10 Essential (primary) hypertension: Secondary | ICD-10-CM | POA: Diagnosis not present

## 2014-02-13 DIAGNOSIS — G8929 Other chronic pain: Secondary | ICD-10-CM | POA: Diagnosis not present

## 2014-02-13 DIAGNOSIS — Z9861 Coronary angioplasty status: Secondary | ICD-10-CM | POA: Insufficient documentation

## 2014-02-13 DIAGNOSIS — Z7902 Long term (current) use of antithrombotics/antiplatelets: Secondary | ICD-10-CM | POA: Diagnosis not present

## 2014-02-13 DIAGNOSIS — G40909 Epilepsy, unspecified, not intractable, without status epilepticus: Secondary | ICD-10-CM | POA: Insufficient documentation

## 2014-02-13 DIAGNOSIS — F209 Schizophrenia, unspecified: Secondary | ICD-10-CM | POA: Insufficient documentation

## 2014-02-13 DIAGNOSIS — Z760 Encounter for issue of repeat prescription: Secondary | ICD-10-CM | POA: Insufficient documentation

## 2014-02-13 DIAGNOSIS — J45909 Unspecified asthma, uncomplicated: Secondary | ICD-10-CM | POA: Diagnosis not present

## 2014-02-13 MED ORDER — ALBUTEROL SULFATE HFA 108 (90 BASE) MCG/ACT IN AERS: 2.0000 | INHALATION_SPRAY | RESPIRATORY_TRACT | Status: DC | PRN

## 2014-02-13 MED ORDER — QUETIAPINE FUMARATE 50 MG PO TABS: 50.0000 mg | ORAL_TABLET | Freq: Every day | ORAL | Status: DC

## 2014-02-13 MED ORDER — FUROSEMIDE 40 MG PO TABS: 40.0000 mg | ORAL_TABLET | Freq: Every day | ORAL | Status: DC

## 2014-02-13 MED ORDER — DIVALPROEX SODIUM 500 MG PO DR TAB: 500.0000 mg | DELAYED_RELEASE_TABLET | Freq: Two times a day (BID) | ORAL | Status: DC

## 2014-02-13 MED ORDER — LEVETIRACETAM 1000 MG PO TABS: 1000.0000 mg | ORAL_TABLET | Freq: Two times a day (BID) | ORAL | Status: DC

## 2014-02-13 MED ORDER — ATORVASTATIN CALCIUM 80 MG PO TABS: 80.0000 mg | ORAL_TABLET | Freq: Every day | ORAL | Status: DC

## 2014-02-13 MED ORDER — HYDRALAZINE HCL 10 MG PO TABS: 10.0000 mg | ORAL_TABLET | Freq: Two times a day (BID) | ORAL | Status: DC

## 2014-02-13 MED ORDER — METOPROLOL TARTRATE 25 MG PO TABS: 12.5000 mg | ORAL_TABLET | Freq: Two times a day (BID) | ORAL | Status: AC

## 2014-02-13 MED ORDER — LISINOPRIL 2.5 MG PO TABS: 2.5000 mg | ORAL_TABLET | Freq: Every day | ORAL | Status: DC

## 2014-02-13 MED ORDER — CLOPIDOGREL BISULFATE 75 MG PO TABS: 75.0000 mg | ORAL_TABLET | Freq: Every day | ORAL | Status: DC

## 2014-02-13 MED ORDER — FAMOTIDINE 20 MG PO TABS: 20.0000 mg | ORAL_TABLET | Freq: Two times a day (BID) | ORAL | Status: DC

## 2014-02-13 NOTE — ED Provider Notes (Signed)
CSN: [id number]     Arrival date & time 02/13/14  1029 History  This chart was scribed for non-physician practitioner Monico Blitz, PA-C working with Francine Graven, DO by Zola Button, ED Scribe. This patient was seen in room TR06C/TR06C and the patient's care was started at 11:00 AM.       Chief Complaint  Patient presents with  . Medication Refill   HPI HPI Comments: Chase Blackwell is a 49 y.o. male with a hx of schizophrenia, seizures, pancreatitis, GERD, HTN, chronic pain, and alcohol abuse who presents to the Emergency Department for a medication refill. Patient is unsure what medications he needs. He states he has run out of all of his medications 2 days ago. He has brought his brother's phone number, who keeps track of the patient's medications. Patient denies auditory hallucinations, visual hallucinations, suicidal ideation, homicidal ideation, chest pain, shortness of breath, nausea vomiting, fever, chills, cough, change in bowel or bladder habits, weakness, headache.  Per chart, his PCP is at the [facility]  Past Medical History  Diagnosis Date  . Hypertension   . Asthma   . GERD (gastroesophageal reflux disease)   . Chronic pain   . Schizophrenia   . Alcohol abuse   . Seizures   . Pancreatitis    Past Surgical History  Procedure Laterality Date  . Left heart catheterization with coronary angiogram N/A 07/06/2013    Procedure: LEFT HEART CATHETERIZATION WITH CORONARY ANGIOGRAM;  Surgeon: Clent Demark, MD;  Location: [facility] CATH LAB;  Service: Cardiovascular;  Laterality: N/A;  . Percutaneous coronary stent intervention (pci-s)  07/06/2013    Procedure: PERCUTANEOUS CORONARY STENT INTERVENTION (PCI-S);  Surgeon: Clent Demark, MD;  Location: [facility] CATH LAB;  Service: Cardiovascular;;   Family History  Problem Relation Age of Onset  . Malignant hyperthermia Mother   . Cirrhosis Father   . Alcohol abuse Father    History  Substance Use Topics  . Smoking status: Current  Every Day Smoker -- 1.00 packs/day    Types: Cigarettes  . Smokeless tobacco: Not on file  . Alcohol Use: 1.2 oz/week    2 Cans of beer per week     Comment: 1 400 oz per week    Review of Systems A complete 10 system review of systems was obtained and all systems are negative except as noted in the HPI and PMH.     Allergies  Hctz; Hydroxyzine; Sulfonamide derivatives; and Cetirizine & related  Home Medications   Prior to Admission medications   Medication Sig Start Date End Date Taking? Authorizing Provider  albuterol (PROVENTIL HFA;VENTOLIN HFA) 108 (90 BASE) MCG/ACT inhaler Inhale 1-2 puffs into the lungs every 4 (four) hours as needed for wheezing or shortness of breath. 11/23/13   Kristen N Makris, DO  aspirin 81 MG chewable tablet Chew 1 tablet (81 mg total) by mouth daily. 11/23/13   Kristen N Kielty, DO  atorvastatin (LIPITOR) 80 MG tablet Take 1 tablet (80 mg total) by mouth at bedtime. 11/23/13   Kristen N Ray, DO  busPIRone (BUSPAR) 5 MG tablet Take 5 mg by mouth 2 (two) times daily.    Historical Provider, MD  clopidogrel (PLAVIX) 75 MG tablet Take 1 tablet (75 mg total) by mouth daily. 11/23/13   Kristen N Kem, DO  divalproex (DEPAKOTE) 500 MG DR tablet Take 1 tablet (500 mg total) by mouth 2 (two) times daily. 11/23/13   Kristen N Schader, DO  famotidine (PEPCID) 20 MG tablet Take  1 tablet (20 mg total) by mouth 2 (two) times daily. 11/23/13   Kristen N Apel, DO  folic acid (FOLVITE) 1 MG tablet Place 1 tablet (1 mg total) into feeding tube daily. Patient taking differently: Take 1 mg by mouth daily.  07/23/13   Donita Brooks, NP  furosemide (LASIX) 40 MG tablet Take 1 tablet (40 mg total) by mouth daily. 11/23/13   Kristen N Pendergrass, DO  haloperidol decanoate (HALDOL DECANOATE) 50 MG/ML injection Inject 100 mg into the muscle every 28 (twenty-eight) days.    Historical Provider, MD  hydrALAZINE (APRESOLINE) 10 MG tablet Take 1 tablet (10 mg total) by mouth 2 (two) times daily.  11/23/13   Kristen N Delatte, DO  levETIRAcetam (KEPPRA) 1000 MG tablet Take 1 tablet (1,000 mg total) by mouth 2 (two) times daily. 11/23/13   Kristen N Idris, DO  lisinopril (PRINIVIL,ZESTRIL) 2.5 MG tablet Take 1 tablet (2.5 mg total) by mouth daily. 11/23/13   Kristen N Kawano, DO  LORazepam (ATIVAN) 0.5 MG tablet Take 0.5 mg by mouth every 8 (eight) hours as needed for anxiety.    Historical Provider, MD  Melatonin 5 MG TABS Take 1 tablet by mouth at bedtime.    Historical Provider, MD  meloxicam (MOBIC) 15 MG tablet Take 1 tablet (15 mg total) by mouth daily. 11/13/13   Kaitlyn Szekalski, PA-C  metoprolol tartrate (LOPRESSOR) 25 MG tablet Take 0.5 tablets (12.5 mg total) by mouth 2 (two) times daily. 11/23/13   Kristen N Geffert, DO  modafinil (PROVIGIL) 100 MG tablet Take one tablet by mouth every morning 08/30/13   Tiffany L Reed, DO  Multiple Vitamins-Minerals (CENTAMIN) LIQD Take 5 mLs by mouth daily.    Historical Provider, MD  Omega-3 Fatty Acids (SM FISH OIL) 1000 MG CAPS Take 6,000 mg by mouth daily.    Historical Provider, MD  oxyCODONE (ROXICODONE) 5 MG immediate release tablet Take one tablet by mouth twice daily for pain 10/01/13   Tiffany L Reed, DO  promethazine (PHENERGAN) 25 MG tablet Take 1 tablet (25 mg total) by mouth every 6 (six) hours as needed for nausea. 01/15/14   Nat Christen, MD  QUEtiapine (SEROQUEL) 50 MG tablet Take 1 tablet (50 mg total) by mouth at bedtime. 11/23/13   Kristen N Nairn, DO  thiamine 100 MG tablet Place 1 tablet (100 mg total) into feeding tube daily. Patient taking differently: Take 100 mg by mouth daily.  07/23/13   Donita Brooks, NP  zolpidem (AMBIEN) 5 MG tablet Take 1 tablet (5 mg total) by mouth at bedtime as needed for sleep. 08/30/13   Tiffany L Reed, DO   BP 108/72 mmHg  Pulse 80  Temp(Src) 97.7 F (36.5 C) (Oral)  Resp 20  SpO2 97% Physical Exam  Constitutional: He is oriented to person, place, and time. He appears well-developed and  well-nourished.  HENT:  Head: Normocephalic and atraumatic.  Mouth/Throat: Oropharynx is clear and moist.  Eyes: Conjunctivae and EOM are normal. Pupils are equal, round, and reactive to light.  Neck: Normal range of motion. Neck supple.  Cardiovascular: Normal rate, regular rhythm and intact distal pulses.   Pulmonary/Chest: Effort normal and breath sounds normal. No respiratory distress. He has no wheezes. He has no rales. He exhibits no tenderness.  Abdominal: Soft. Bowel sounds are normal. He exhibits no distension and no mass. There is no tenderness. There is no rebound and no guarding.  Musculoskeletal: Normal range of motion. He exhibits no edema.  Neurological: He is alert and oriented to person, place, and time.  Skin: Skin is warm.  Psychiatric: He has a normal mood and affect.    ED Course  Procedures  DIAGNOSTIC STUDIES: Oxygen Saturation is 97% on room air, normal by my interpretation.      Labs Review Labs Reviewed - No data to display  Imaging Review No results found.   EKG Interpretation None      MDM   Final diagnoses:  Medication refill   Filed Vitals:   02/13/14 1032 02/13/14 1210  BP: 108/72 108/74  Pulse: 80 73  Temp: 97.7 F (36.5 C) 98.2 F (36.8 C)  TempSrc: Oral Oral  Resp: 20 16  SpO2: 97% 100%    Chase Blackwell is a pleasant 49 y.o. male requesting medication refill. Patient is out of all of his medications, [facility] and as per his brother he has an appointment with them on March 1. Pharmacy tech has called [facility] on [address] and confirmed when his medications were filled. After patient was given consent I had talked to his brother Caswell Alvillar who states that he manages this patient's medications and his other brother who lives with the patient helps administer them. Patient is given one month prescription for seizure meds, hypertension meds, and various other medications.   Discussed case with attending MD who agrees with  plan and stability to d/c to home.   Evaluation does not show pathology that would require ongoing emergent intervention or inpatient treatment. Pt is hemodynamically stable and mentating appropriately. Discussed findings and plan with patient/guardian, who agrees with care plan. All questions answered. Return precautions discussed and outpatient follow up given.   I personally performed the services described in this documentation, which was scribed in my presence. The recorded information has been reviewed and is accurate.    Monico Blitz, PA-C 02/13/14 [place], DO 02/13/14 1939

## 2014-02-13 NOTE — ED Notes (Addendum)
Pt reports needs refill for all meds. Does not know which meds but ran out yesterday. His brother has a list but he is not here.

## 2014-02-13 NOTE — Discharge Instructions (Signed)
Please follow with your primary care doctor in the next 2 days for a check-up. They must obtain records for further management.   Do not hesitate to return to the Emergency Department for any new, worsening or concerning symptoms.   Medication Refill, Emergency Department We have refilled your medication today as a courtesy to you. It is best for your medical care, however, to take care of getting refills done through your primary caregiver's office. They have your records and can do a better job of follow-up than we can in the emergency department. On maintenance medications, we often only prescribe enough medications to get you by until you are able to see your regular caregiver. This is a more expensive way to refill medications. In the future, please plan for refills so that you will not have to use the emergency department for this. Thank you for your help. Your help allows [place] to better take care of the daily emergencies that enter our department. Document Released: 04/09/2003 Document Revised: 03/15/2011 Document Reviewed: 03/30/2013 [facility] Patient Information 2015 [place], [place]. This information is not intended to replace advice given to you by your health care provider. Make sure you discuss any questions you have with your health care provider.

## 2014-03-21 ENCOUNTER — Encounter (HOSPITAL_COMMUNITY): Payer: Self-pay | Admitting: Emergency Medicine

## 2014-03-21 ENCOUNTER — Encounter (HOSPITAL_COMMUNITY): Payer: Self-pay

## 2014-03-21 ENCOUNTER — Emergency Department (HOSPITAL_COMMUNITY)
Admission: EM | Admit: 2014-03-21 | Discharge: 2014-03-22 | Disposition: A | Discharge status: Home / Self Care | Payer: Medicare Other | Attending: Emergency Medicine | Admitting: Emergency Medicine

## 2014-03-21 ENCOUNTER — Emergency Department (HOSPITAL_COMMUNITY)
Admission: EM | Admit: 2014-03-21 | Discharge: 2014-03-21 | Discharge status: Against Medical Advice | Payer: Medicare Other | Attending: Emergency Medicine | Admitting: Emergency Medicine

## 2014-03-21 ENCOUNTER — Emergency Department (HOSPITAL_COMMUNITY): Payer: Medicare Other

## 2014-03-21 DIAGNOSIS — G8929 Other chronic pain: Secondary | ICD-10-CM | POA: Insufficient documentation

## 2014-03-21 DIAGNOSIS — Z720 Tobacco use: Secondary | ICD-10-CM | POA: Insufficient documentation

## 2014-03-21 DIAGNOSIS — K219 Gastro-esophageal reflux disease without esophagitis: Secondary | ICD-10-CM | POA: Diagnosis not present

## 2014-03-21 DIAGNOSIS — G40909 Epilepsy, unspecified, not intractable, without status epilepticus: Secondary | ICD-10-CM | POA: Insufficient documentation

## 2014-03-21 DIAGNOSIS — R451 Restlessness and agitation: Secondary | ICD-10-CM

## 2014-03-21 DIAGNOSIS — Z9861 Coronary angioplasty status: Secondary | ICD-10-CM | POA: Diagnosis not present

## 2014-03-21 DIAGNOSIS — J45909 Unspecified asthma, uncomplicated: Secondary | ICD-10-CM | POA: Diagnosis not present

## 2014-03-21 DIAGNOSIS — Z7902 Long term (current) use of antithrombotics/antiplatelets: Secondary | ICD-10-CM | POA: Diagnosis not present

## 2014-03-21 DIAGNOSIS — I10 Essential (primary) hypertension: Secondary | ICD-10-CM | POA: Diagnosis not present

## 2014-03-21 DIAGNOSIS — R05 Cough: Secondary | ICD-10-CM | POA: Diagnosis not present

## 2014-03-21 DIAGNOSIS — Z79899 Other long term (current) drug therapy: Secondary | ICD-10-CM | POA: Diagnosis not present

## 2014-03-21 DIAGNOSIS — F200 Paranoid schizophrenia: Secondary | ICD-10-CM | POA: Diagnosis not present

## 2014-03-21 DIAGNOSIS — Z9889 Other specified postprocedural states: Secondary | ICD-10-CM | POA: Insufficient documentation

## 2014-03-21 DIAGNOSIS — Z791 Long term (current) use of non-steroidal anti-inflammatories (NSAID): Secondary | ICD-10-CM | POA: Diagnosis not present

## 2014-03-21 DIAGNOSIS — R4182 Altered mental status, unspecified: Secondary | ICD-10-CM | POA: Diagnosis present

## 2014-03-21 DIAGNOSIS — Z7982 Long term (current) use of aspirin: Secondary | ICD-10-CM | POA: Diagnosis not present

## 2014-03-21 LAB — CBC
HCT: 37 % — ABNORMAL LOW (ref 39.0–52.0)
Hemoglobin: 12.3 g/dL — ABNORMAL LOW (ref 13.0–17.0)
MCH: 31.1 pg (ref 26.0–34.0)
MCHC: 33.2 g/dL (ref 30.0–36.0)
MCV: 93.4 fL (ref 78.0–100.0)
Platelets: 219 10*3/uL (ref 150–400)
RBC: 3.96 MIL/uL — AB (ref 4.22–5.81)
RDW: 14.1 % (ref 11.5–15.5)
WBC: 6.1 10*3/uL (ref 4.0–10.5)

## 2014-03-21 LAB — CBG MONITORING, ED: Glucose-Capillary: 114 mg/dL — ABNORMAL HIGH (ref 70–99)

## 2014-03-21 NOTE — ED Notes (Addendum)
Patient's contact is his brother, [name] [phone]

## 2014-03-21 NOTE — ED Notes (Signed)
Bed: WA17 Expected date:  Expected time:  Means of arrival:  Comments: EMS/combative altered LOC

## 2014-03-21 NOTE — ED Notes (Signed)
Pt c/o cough x weeks and pain with cough; pt poor historian

## 2014-03-21 NOTE — ED Notes (Signed)
Patient arrives by EMS with complaints altered mental status.  EMS states they were called out by patient's brother due to behavior.  Patient's [place] and was in a drive through to get him something to eat when he started being combative.  Patient is very unkempt with torn and dirty clothing.  Patient is not verbal-wild eye stare.  EMS states patient was combative during ride here.  Patient will not follow instruction at this time

## 2014-03-21 NOTE — ED Notes (Signed)
Pt stated he wanted to leave, took off his BP cuff and handed it to me.  In no apparent distress.  Left ambulatory.

## 2014-03-21 NOTE — ED Provider Notes (Signed)
CSN: [id number]     Arrival date & time 03/21/14  2234 History   First MD Initiated Contact with Patient 03/21/14 2301     Chief Complaint  Patient presents with  . Altered Mental Status      HPI Patient presents to the emergency department with altered mental status.  He has a history of paranoid schizophrenia.  He was in his normal state health earlier and when his brother went to pick him up he acutely became agitated.  He was transferred to the emergency department.  Since being in the emergency department he has stabilized his been very calm and cooperative.  He has no complaints at this time.  He's been eating without difficulty.  He states compliant with his medications.  He has no other significant complaint.  He cannot really explain to me however why he was so agitated acutely.    Past Medical History  Diagnosis Date  . Hypertension   . Asthma   . GERD (gastroesophageal reflux disease)   . Chronic pain   . Schizophrenia   . Alcohol abuse   . Seizures   . Pancreatitis    Past Surgical History  Procedure Laterality Date  . Left heart catheterization with coronary angiogram N/A 07/06/2013    Procedure: LEFT HEART CATHETERIZATION WITH CORONARY ANGIOGRAM;  Surgeon: Clent Demark, MD;  Location: [facility] CATH LAB;  Service: Cardiovascular;  Laterality: N/A;  . Percutaneous coronary stent intervention (pci-s)  07/06/2013    Procedure: PERCUTANEOUS CORONARY STENT INTERVENTION (PCI-S);  Surgeon: Clent Demark, MD;  Location: [facility] CATH LAB;  Service: Cardiovascular;;   Family History  Problem Relation Age of Onset  . Malignant hyperthermia Mother   . Cirrhosis Father   . Alcohol abuse Father    History  Substance Use Topics  . Smoking status: Current Every Day Smoker -- 1.00 packs/day    Types: Cigarettes  . Smokeless tobacco: Not on file  . Alcohol Use: 1.2 oz/week    2 Cans of beer per week     Comment: 1 400 oz per week    Review of Systems  All other systems reviewed and  are negative.     Allergies  Hctz; Hydroxyzine; Sulfonamide derivatives; and Cetirizine & related  Home Medications   Prior to Admission medications   Medication Sig Start Date End Date Taking? Authorizing Provider  albuterol (PROVENTIL HFA;VENTOLIN HFA) 108 (90 BASE) MCG/ACT inhaler Inhale 2 puffs into the lungs every 4 (four) hours as needed for wheezing or shortness of breath. 02/13/14   Nicole Pisciotta, PA-C  aspirin 81 MG chewable tablet Chew 1 tablet (81 mg total) by mouth daily. 11/23/13   Kristen N Goodpasture, DO  atorvastatin (LIPITOR) 80 MG tablet Take 1 tablet (80 mg total) by mouth daily. 02/13/14   Nicole Pisciotta, PA-C  benztropine (COGENTIN) 0.5 MG tablet Take 0.5 mg by mouth 2 (two) times daily. 01/31/14   Historical Provider, MD  clopidogrel (PLAVIX) 75 MG tablet Take 1 tablet (75 mg total) by mouth daily. 02/13/14   Nicole Pisciotta, PA-C  divalproex (DEPAKOTE) 500 MG DR tablet Take 1 tablet (500 mg total) by mouth 2 (two) times daily. 02/13/14   Nicole Pisciotta, PA-C  famotidine (PEPCID) 20 MG tablet Take 1 tablet (20 mg total) by mouth 2 (two) times daily. 02/13/14   Nicole Pisciotta, PA-C  folic acid (FOLVITE) 1 MG tablet Place 1 tablet (1 mg total) into feeding tube daily. Patient taking differently: Take 1 mg by mouth daily.  07/23/13   Donita Brooks, NP  furosemide (LASIX) 40 MG tablet Take 1 tablet (40 mg total) by mouth daily. 02/13/14   Nicole Pisciotta, PA-C  haloperidol decanoate (HALDOL DECANOATE) 50 MG/ML injection Inject 100 mg into the muscle every 28 (twenty-eight) days.    Historical Provider, MD  hydrALAZINE (APRESOLINE) 10 MG tablet Take 1 tablet (10 mg total) by mouth 2 (two) times daily. 02/13/14   Nicole Pisciotta, PA-C  levETIRAcetam (KEPPRA) 1000 MG tablet Take 1 tablet (1,000 mg total) by mouth 2 (two) times daily. 02/13/14   Nicole Pisciotta, PA-C  lisinopril (ZESTRIL) 2.5 MG tablet Take 1 tablet (2.5 mg total) by mouth daily. 02/13/14   Nicole Pisciotta, PA-C   LORazepam (ATIVAN) 0.5 MG tablet Take 0.5 mg by mouth every 8 (eight) hours as needed for anxiety.    Historical Provider, MD  meloxicam (MOBIC) 15 MG tablet Take 1 tablet (15 mg total) by mouth daily. 11/13/13   Kaitlyn Szekalski, PA-C  metoprolol tartrate (LOPRESSOR) 25 MG tablet Take 0.5 tablets (12.5 mg total) by mouth 2 (two) times daily. 02/13/14   Nicole Pisciotta, PA-C  oxyCODONE (ROXICODONE) 5 MG immediate release tablet Take one tablet by mouth twice daily for pain 10/01/13   Tiffany L Reed, DO  PARoxetine (PAXIL) 10 MG tablet Take 10 mg by mouth daily. 01/31/14   Historical Provider, MD  QUEtiapine (SEROQUEL) 50 MG tablet Take 1 tablet (50 mg total) by mouth at bedtime. 02/13/14   Nicole Pisciotta, PA-C  traZODone (DESYREL) 100 MG tablet Take 100 mg by mouth at bedtime. 01/31/14   Historical Provider, MD   BP 122/76 mmHg  Pulse 92  Temp(Src) 98 F (36.7 C) (Oral)  Resp 16  SpO2 98% Physical Exam  Constitutional: He is oriented to person, place, and time. He appears well-developed and well-nourished.  HENT:  Head: Normocephalic and atraumatic.  Eyes: EOM are normal.  Neck: Normal range of motion.  Cardiovascular: Normal rate, regular rhythm, normal heart sounds and intact distal pulses.   Pulmonary/Chest: Effort normal and breath sounds normal. No respiratory distress.  Abdominal: Soft. He exhibits no distension. There is no tenderness.  Musculoskeletal: Normal range of motion.  Neurological: He is alert and oriented to person, place, and time.  Skin: Skin is warm and dry.  Psychiatric: He has a normal mood and affect. Judgment normal.  Nursing note and vitals reviewed.   ED Course  Procedures (including critical care time) Labs Review Labs Reviewed  ACETAMINOPHEN LEVEL  CBC  COMPREHENSIVE METABOLIC PANEL  ETHANOL  SALICYLATE LEVEL  URINE RAPID DRUG SCREEN (HOSP PERFORMED)  VALPROIC ACID LEVEL    Imaging Review Dg Chest 2 View (if Patient Has Fever And/or  Copd)  03/21/2014   CLINICAL DATA:  Chest pain, shortness of breath, cough, chest congestion. Smoker.  EXAM: CHEST  2 VIEW  COMPARISON:  01/15/2014.  FINDINGS: Normal sized heart. Clear lungs. Mild thoracic spine degenerative changes.  IMPRESSION: No acute abnormality.   Electronically Signed   By: Claudie Revering M.D.   On: 03/21/2014 18:59     EKG Interpretation None      MDM   Final diagnoses:  None   patient is calm and cooperative at this time.  Labs without significant abnormality.  Patient is ambulatory and eating.  Discharge home in good condition.  The patient does not appear to be a threat to himself or others at this time.    Jola Schmidt, MD 03/22/14 [phone]

## 2014-03-22 LAB — ETHANOL: Alcohol, Ethyl (B): <5 mg/dL (ref 0–9)

## 2014-03-22 LAB — COMPREHENSIVE METABOLIC PANEL
ALBUMIN: 4.2 g/dL (ref 3.5–5.2)
ALT: 14 U/L (ref 0–53)
AST: 17 U/L (ref 0–37)
Alkaline Phosphatase: 43 U/L (ref 39–117)
Anion gap: 7 (ref 5–15)
BUN: 11 mg/dL (ref 6–23)
CO2: 24 mmol/L (ref 19–32)
CREATININE: 0.94 mg/dL (ref 0.50–1.35)
Calcium: 8.7 mg/dL (ref 8.4–10.5)
Chloride: 106 mmol/L (ref 96–112)
GFR calc Af Amer: >90 mL/min (ref >90)
Glucose, Bld: 132 mg/dL — ABNORMAL HIGH (ref 70–99)
Potassium: 3.8 mmol/L (ref 3.5–5.1)
SODIUM: 137 mmol/L (ref 135–145)
TOTAL PROTEIN: 6.4 g/dL (ref 6.0–8.3)
Total Bilirubin: 0.7 mg/dL (ref 0.3–1.2)

## 2014-03-22 LAB — VALPROIC ACID LEVEL

## 2014-03-22 LAB — SALICYLATE LEVEL

## 2014-03-22 LAB — ACETAMINOPHEN LEVEL

## 2014-03-22 NOTE — Discharge Instructions (Signed)
Schizophrenia °Schizophrenia is a mental illness. It may cause disturbed or disorganized thinking, speech, or behavior. People with schizophrenia have problems functioning in one or more areas of life: work, school, home, or relationships. People with schizophrenia are at increased risk for suicide, certain chronic physical illnesses, and unhealthy behaviors, such as smoking and drug use. °People who have family members with schizophrenia are at higher risk of developing the illness. Schizophrenia affects men and women equally but usually appears at an earlier age (teenage or early adult years) in men.  °SYMPTOMS °The earliest symptoms are often subtle (prodrome) and may go unnoticed until the illness becomes more severe (first-break psychosis). Symptoms of schizophrenia may be continuous or may come and go in severity. Episodes often are triggered by major life events, such as family stress, college, military service, marriage, pregnancy or child birth, divorce, or loss of a loved one. People with schizophrenia may see, hear, or feel things that do not exist (hallucinations). They may have false beliefs in spite of obvious proof to the contrary (delusions). Sometimes speech is incoherent or behavior is odd or withdrawn.  °DIAGNOSIS °Schizophrenia is diagnosed through an assessment by your caregiver. Your caregiver will ask questions about your thoughts, behavior, mood, and ability to function in daily life. Your caregiver may ask questions about your medical history and use of alcohol or drugs, including prescription medication. Your caregiver may also order blood tests and imaging exams. Certain medical conditions and substances can cause symptoms that resemble schizophrenia. Your caregiver may refer you to a mental health specialist for evaluation. There are three major criterion for a diagnosis of schizophrenia: °· Two or more of the following five symptoms are present for a month or longer: °¨ Delusions. Often  the delusions are that you are being attacked, harassed, cheated, persecuted or conspired against (persecutory delusions). °¨ Hallucinations.   °¨ Disorganized speech that does not make sense to others. °¨ Grossly disorganized (confused or unfocused) behavior or extremely overactive or underactive motor activity (catatonia). °¨ Negative symptoms such as bland or blunted emotions (flat affect), loss of will power (avolition), and withdrawal from social contacts (social isolation). °· Level of functioning in one or more major areas of life (work, school, relationships, or self-care) is markedly below the level of functioning before the onset of illness.   °· There are continuous signs of illness (either mild symptoms or decreased level of functioning) for at least 6 months or longer. °TREATMENT  °Schizophrenia is a long-term illness. It is best controlled with continuous treatment rather than treatment only when symptoms occur. The following treatments are used to manage schizophrenia: °· Medication--Medication is the most effective and important form of treatment for schizophrenia. Antipsychotic medications are usually prescribed to help manage schizophrenia. Other types of medication may be added to relieve any symptoms that may occur despite the use of antipsychotic medications. °· Counseling or talk therapy--Individual, group, or family counseling may be helpful in providing education, support, and guidance. Many people with schizophrenia also benefit from social skills and job skills (vocational) training. °A combination of medication and counseling is best for managing the disorder over time. A procedure in which electricity is applied to the brain through the scalp (electroconvulsive therapy) may be used to treat catatonic schizophrenia or schizophrenia in people who cannot take or do not respond to medication and counseling. °Document Released: 12/19/1999 Document Revised: 08/23/2012 Document Reviewed:  03/15/2012 °[employer]® Patient Information ©2015 [employer]. This information is not intended to replace advice given to you by   your health care provider. Make sure you discuss any questions you have with your health care provider. ° °

## 2014-04-11 ENCOUNTER — Encounter (HOSPITAL_COMMUNITY): Payer: Self-pay | Admitting: Emergency Medicine

## 2014-04-11 ENCOUNTER — Emergency Department (HOSPITAL_COMMUNITY): Payer: Medicare Other

## 2014-04-11 ENCOUNTER — Emergency Department (HOSPITAL_COMMUNITY)
Admission: EM | Admit: 2014-04-11 | Discharge: 2014-04-11 | Disposition: A | Discharge status: Home / Self Care | Payer: Medicare Other | Attending: Emergency Medicine | Admitting: Emergency Medicine

## 2014-04-11 DIAGNOSIS — J45909 Unspecified asthma, uncomplicated: Secondary | ICD-10-CM | POA: Insufficient documentation

## 2014-04-11 DIAGNOSIS — R5383 Other fatigue: Secondary | ICD-10-CM | POA: Insufficient documentation

## 2014-04-11 DIAGNOSIS — Z7982 Long term (current) use of aspirin: Secondary | ICD-10-CM | POA: Diagnosis not present

## 2014-04-11 DIAGNOSIS — Z9889 Other specified postprocedural states: Secondary | ICD-10-CM | POA: Insufficient documentation

## 2014-04-11 DIAGNOSIS — R404 Transient alteration of awareness: Secondary | ICD-10-CM

## 2014-04-11 DIAGNOSIS — K219 Gastro-esophageal reflux disease without esophagitis: Secondary | ICD-10-CM | POA: Diagnosis not present

## 2014-04-11 DIAGNOSIS — Z9861 Coronary angioplasty status: Secondary | ICD-10-CM | POA: Insufficient documentation

## 2014-04-11 DIAGNOSIS — Z79899 Other long term (current) drug therapy: Secondary | ICD-10-CM | POA: Diagnosis not present

## 2014-04-11 DIAGNOSIS — G8929 Other chronic pain: Secondary | ICD-10-CM | POA: Insufficient documentation

## 2014-04-11 DIAGNOSIS — Z791 Long term (current) use of non-steroidal anti-inflammatories (NSAID): Secondary | ICD-10-CM | POA: Insufficient documentation

## 2014-04-11 DIAGNOSIS — Z7902 Long term (current) use of antithrombotics/antiplatelets: Secondary | ICD-10-CM | POA: Diagnosis not present

## 2014-04-11 DIAGNOSIS — I10 Essential (primary) hypertension: Secondary | ICD-10-CM | POA: Diagnosis not present

## 2014-04-11 DIAGNOSIS — G40909 Epilepsy, unspecified, not intractable, without status epilepticus: Secondary | ICD-10-CM | POA: Diagnosis not present

## 2014-04-11 DIAGNOSIS — Z720 Tobacco use: Secondary | ICD-10-CM | POA: Diagnosis not present

## 2014-04-11 DIAGNOSIS — I959 Hypotension, unspecified: Secondary | ICD-10-CM | POA: Diagnosis present

## 2014-04-11 LAB — CBC WITH DIFFERENTIAL/PLATELET
BASOS ABS: 0 10*3/uL (ref 0.0–0.1)
Basophils Relative: 0 % (ref 0–1)
Eosinophils Absolute: 0.1 10*3/uL (ref 0.0–0.7)
Eosinophils Relative: 3 % (ref 0–5)
HCT: 37.7 % — ABNORMAL LOW (ref 39.0–52.0)
Hemoglobin: 12.7 g/dL — ABNORMAL LOW (ref 13.0–17.0)
LYMPHS ABS: 2.2 10*3/uL (ref 0.7–4.0)
LYMPHS PCT: 39 % (ref 12–46)
MCH: 31.8 pg (ref 26.0–34.0)
MCHC: 33.7 g/dL (ref 30.0–36.0)
MCV: 94.3 fL (ref 78.0–100.0)
Monocytes Absolute: 0.3 10*3/uL (ref 0.1–1.0)
Monocytes Relative: 6 % (ref 3–12)
NEUTROS PCT: 52 % (ref 43–77)
Neutro Abs: 2.9 10*3/uL (ref 1.7–7.7)
PLATELETS: 205 10*3/uL (ref 150–400)
RBC: 4 MIL/uL — ABNORMAL LOW (ref 4.22–5.81)
RDW: 13.8 % (ref 11.5–15.5)
WBC: 5.6 10*3/uL (ref 4.0–10.5)

## 2014-04-11 LAB — COMPREHENSIVE METABOLIC PANEL
ALBUMIN: 3.4 g/dL — AB (ref 3.5–5.2)
ALK PHOS: 43 U/L (ref 39–117)
ALT: 12 U/L (ref 0–53)
ANION GAP: 6 (ref 5–15)
AST: 16 U/L (ref 0–37)
BUN: 9 mg/dL (ref 6–23)
CO2: 28 mmol/L (ref 19–32)
CREATININE: 1.09 mg/dL (ref 0.50–1.35)
Calcium: 8.6 mg/dL (ref 8.4–10.5)
Chloride: 107 mmol/L (ref 96–112)
GFR calc non Af Amer: 79 mL/min — ABNORMAL LOW (ref >90)
GLUCOSE: 124 mg/dL — AB (ref 70–99)
POTASSIUM: 3.8 mmol/L (ref 3.5–5.1)
Sodium: 141 mmol/L (ref 135–145)
Total Bilirubin: 0.5 mg/dL (ref 0.3–1.2)
Total Protein: 5.4 g/dL — ABNORMAL LOW (ref 6.0–8.3)

## 2014-04-11 LAB — I-STAT VENOUS BLOOD GAS, ED
BICARBONATE: 27.8 meq/L — AB (ref 20.0–24.0)
O2 SAT: 27 %
PCO2 VEN: 55.9 mmHg — AB (ref 45.0–50.0)
TCO2: 29 mmol/L (ref 0–100)
pH, Ven: 7.304 — ABNORMAL HIGH (ref 7.250–7.300)
pO2, Ven: 20 mmHg — CL (ref 30.0–45.0)

## 2014-04-11 LAB — RAPID URINE DRUG SCREEN, HOSP PERFORMED
Amphetamines: NOT DETECTED
BARBITURATES: NOT DETECTED
BENZODIAZEPINES: NOT DETECTED
Cocaine: NOT DETECTED
OPIATES: NOT DETECTED
Tetrahydrocannabinol: POSITIVE — AB

## 2014-04-11 LAB — ETHANOL

## 2014-04-11 LAB — CBG MONITORING, ED: GLUCOSE-CAPILLARY: 114 mg/dL — AB (ref 70–99)

## 2014-04-11 MED ORDER — SODIUM CHLORIDE 0.9 % IV BOLUS (SEPSIS)
1000.0000 mL | Freq: Once | INTRAVENOUS | Status: AC
Start: 2014-04-11 — End: 2014-04-11
  Administered 2014-04-11: 1000 mL via INTRAVENOUS

## 2014-04-11 MED ORDER — IBUPROFEN 400 MG PO TABS
600.0000 mg | ORAL_TABLET | Freq: Once | ORAL | Status: AC
  Administered 2014-04-11: 600 mg via ORAL
  Filled 2014-04-11 (×2): qty 1

## 2014-04-11 NOTE — ED Notes (Signed)
CBG 114 

## 2014-04-11 NOTE — ED Provider Notes (Signed)
CSN: [id number]     Arrival date & time 04/11/14  1519 History   First MD Initiated Contact with Patient 04/11/14 1526     Chief Complaint  Patient presents with  . Hypotension     (Consider location/radiation/quality/duration/timing/severity/associated sxs/prior Treatment) HPI   The following history is limited due to the pt having AMS:  This is a 49 yo male, with PMH HTN, paranoid schizophrenia, EtOH abuse, seizures, presenting today with AMS, hypotension.  Paramedics found the patient on the side of the road, bending over.  Paramedics pulled over, evaluated the pt, and he was hypotensive with SBP of 70.  Pt denies any complaints other than lightheadedness.  He cannot tell me when this started.  He has been drinking just "a little" EtOH today, also smoked marijuana.  His symptoms are improving with fluids (~300 cc in ambulance).  He denies CP, SOB, HA, focal weakness, numbness, or tingling.  Pt states he might have bumped his head the other day.    Past Medical History  Diagnosis Date  . Hypertension   . Asthma   . GERD (gastroesophageal reflux disease)   . Chronic pain   . Schizophrenia   . Alcohol abuse   . Seizures   . Pancreatitis    Past Surgical History  Procedure Laterality Date  . Left heart catheterization with coronary angiogram N/A 07/06/2013    Procedure: LEFT HEART CATHETERIZATION WITH CORONARY ANGIOGRAM;  Surgeon: Clent Demark, MD;  Location: [facility] CATH LAB;  Service: Cardiovascular;  Laterality: N/A;  . Percutaneous coronary stent intervention (pci-s)  07/06/2013    Procedure: PERCUTANEOUS CORONARY STENT INTERVENTION (PCI-S);  Surgeon: Clent Demark, MD;  Location: [facility] CATH LAB;  Service: Cardiovascular;;   Family History  Problem Relation Age of Onset  . Malignant hyperthermia Mother   . Cirrhosis Father   . Alcohol abuse Father    History  Substance Use Topics  . Smoking status: Current Every Day Smoker -- 1.00 packs/day    Types: Cigarettes  . Smokeless  tobacco: Not on file  . Alcohol Use: 1.2 oz/week    2 Cans of beer per week     Comment: daily heavy drinker    Review of Systems  Constitutional: Positive for fatigue. Negative for fever and chills.  HENT: Negative for facial swelling.   Eyes: Negative for pain and visual disturbance.  Respiratory: Negative for chest tightness and shortness of breath.   Cardiovascular: Negative for chest pain.  Gastrointestinal: Negative for nausea and vomiting.  Genitourinary: Negative for dysuria.  Musculoskeletal: Negative for myalgias and arthralgias.  Neurological: Positive for light-headedness. Negative for headaches.      Allergies  Hctz; Hydroxyzine; Sulfonamide derivatives; and Cetirizine & related  Home Medications   Prior to Admission medications   Medication Sig Start Date End Date Taking? Authorizing Provider  albuterol (PROVENTIL HFA;VENTOLIN HFA) 108 (90 BASE) MCG/ACT inhaler Inhale 2 puffs into the lungs every 4 (four) hours as needed for wheezing or shortness of breath. 02/13/14   Nicole Pisciotta, PA-C  aspirin 81 MG chewable tablet Chew 1 tablet (81 mg total) by mouth daily. 11/23/13   Kristen N Cavitt, DO  atorvastatin (LIPITOR) 80 MG tablet Take 1 tablet (80 mg total) by mouth daily. 02/13/14   Nicole Pisciotta, PA-C  benztropine (COGENTIN) 0.5 MG tablet Take 0.5 mg by mouth 2 (two) times daily. 01/31/14   Historical Provider, MD  clopidogrel (PLAVIX) 75 MG tablet Take 1 tablet (75 mg total) by mouth daily. 02/13/14  Nicole Pisciotta, PA-C  divalproex (DEPAKOTE) 500 MG DR tablet Take 1 tablet (500 mg total) by mouth 2 (two) times daily. 02/13/14   Nicole Pisciotta, PA-C  famotidine (PEPCID) 20 MG tablet Take 1 tablet (20 mg total) by mouth 2 (two) times daily. 02/13/14   Nicole Pisciotta, PA-C  folic acid (FOLVITE) 1 MG tablet Place 1 tablet (1 mg total) into feeding tube daily. Patient taking differently: Take 1 mg by mouth daily.  07/23/13   Donita Brooks, NP  furosemide (LASIX)  40 MG tablet Take 1 tablet (40 mg total) by mouth daily. 02/13/14   Nicole Pisciotta, PA-C  haloperidol decanoate (HALDOL DECANOATE) 50 MG/ML injection Inject 100 mg into the muscle every 28 (twenty-eight) days.    Historical Provider, MD  hydrALAZINE (APRESOLINE) 10 MG tablet Take 1 tablet (10 mg total) by mouth 2 (two) times daily. 02/13/14   Nicole Pisciotta, PA-C  levETIRAcetam (KEPPRA) 1000 MG tablet Take 1 tablet (1,000 mg total) by mouth 2 (two) times daily. 02/13/14   Nicole Pisciotta, PA-C  lisinopril (ZESTRIL) 2.5 MG tablet Take 1 tablet (2.5 mg total) by mouth daily. 02/13/14   Nicole Pisciotta, PA-C  LORazepam (ATIVAN) 0.5 MG tablet Take 0.5 mg by mouth every 8 (eight) hours as needed for anxiety.    Historical Provider, MD  meloxicam (MOBIC) 15 MG tablet Take 1 tablet (15 mg total) by mouth daily. 11/13/13   Kaitlyn Szekalski, PA-C  metoprolol tartrate (LOPRESSOR) 25 MG tablet Take 0.5 tablets (12.5 mg total) by mouth 2 (two) times daily. 02/13/14   Nicole Pisciotta, PA-C  oxyCODONE (ROXICODONE) 5 MG immediate release tablet Take one tablet by mouth twice daily for pain 10/01/13   Tiffany L Reed, DO  PARoxetine (PAXIL) 10 MG tablet Take 10 mg by mouth daily. 01/31/14   Historical Provider, MD  QUEtiapine (SEROQUEL) 50 MG tablet Take 1 tablet (50 mg total) by mouth at bedtime. 02/13/14   Nicole Pisciotta, PA-C  traZODone (DESYREL) 100 MG tablet Take 100 mg by mouth at bedtime. 01/31/14   Historical Provider, MD   BP 103/52 mmHg  Pulse 64  Temp(Src) 97.9 F (36.6 C) (Oral)  Resp 13  SpO2 100% Physical Exam  Constitutional: No distress.  thin  HENT:  Head: Normocephalic and atraumatic.  Mouth/Throat: No oropharyngeal exudate.  Singed hair of the mustache, beard  Eyes: Conjunctivae are normal. Pupils are equal, round, and reactive to light. No scleral icterus.  Dilated pupils, equal, round, and reactive  Neck: Normal range of motion. No tracheal deviation present. No thyromegaly present.   Cardiovascular: Normal rate, regular rhythm and normal heart sounds.  Exam reveals no gallop and no friction rub.   No murmur heard. Pulmonary/Chest: Effort normal and breath sounds normal. No stridor. No respiratory distress. He has no wheezes. He has no rales. He exhibits no tenderness.  Abdominal: Soft. He exhibits no distension and no mass. There is no tenderness. There is no rebound and no guarding.  Musculoskeletal: Normal range of motion. He exhibits no edema.  Neurological: He is alert. He has normal strength. No cranial nerve deficit or sensory deficit. GCS eye subscore is 4. GCS verbal subscore is 4. GCS motor subscore is 6.  Reflex Scores:      Patellar reflexes are 2+ on the right side and 2+ on the left side. Disoriented  Skin: Skin is warm and dry. He is not diaphoretic.    ED Course  Procedures (including critical care time) Labs Review Labs Reviewed  CBG MONITORING, ED - Abnormal; Notable for the following:    Glucose-Capillary 114 (*)    All other components within normal limits  COMPREHENSIVE METABOLIC PANEL  CBC WITH DIFFERENTIAL/PLATELET  URINE RAPID DRUG SCREEN (HOSP PERFORMED)  BLOOD GAS, VENOUS  ETHANOL    Imaging Review No results found.   EKG Interpretation   Date/Time:  Thursday April 11 2014 15:25:31 EDT Ventricular Rate:  65 PR Interval:  176 QRS Duration: 103 QT Interval:  427 QTC Calculation: 444 R Axis:   79 Text Interpretation:  Sinus rhythm ST elev, probable normal early repol  pattern Confirmed by DELOS  MD, DOUGLAS ([id number]) on 04/11/2014 3:32:42 PM      MDM   Final diagnoses:  None    The following history is limited due to the pt having AMS:  This is a 49 yo male, with PMH HTN, paranoid schizophrenia, EtOH abuse, seizures, presenting today with AMS, hypotension.  Paramedics found the patient on the side of the road, bending over.  Paramedics pulled over, evaluated the pt, and he was hypotensive with SBP of 70.  Pt denies any  complaints other than lightheadedness.  He cannot tell me when this started.  He has been drinking just "a little" EtOH today, also smoked marijuana.  His symptoms are improving with fluids (~300 cc in ambulance).  He denies CP, SOB, HA, focal weakness, numbness, or tingling.  Pt states he might have bumped his head the other day.    On exam, MAP is 65, greatly improved with fluids.  CV, resp exams are WNL.  What appears to be trach scar to neck.  Pt has GCS of 14, is disoriented to time.  No abnormalities on abd exam.  No focal neuro deficits.    DDx includes decompensated schizophrenia, hypovolemia, intoxication, intracranial bleed, infection, acidosis, electrolyte abnormality.  CT scan, labs, CXR ordered.  Will monitor closely, fu results.  CT scan of the head wo con is with NAICA.  Pt not psychotic.  Alert, completely oriented at this time.  BP is now at baseline.  Pt speaks without slurred speech, ambulates without ataxia.  No signs or symptoms of infection.  No acute abnormalities on CXR.  Negative for evidence of acidosis, electrolyte abnormality.  No further eval or treatment indicated at this time.  Pt requesting discharge, taking PO.  I deem this appropriate.  Pt was given strict return precautions.  I have discussed case and care has been guided by my attending physician, Delo.    Doy Hutching, MD 04/11/14 1747  Veryl Speak, MD 04/11/14 [phone]

## 2014-04-11 NOTE — Discharge Instructions (Signed)

## 2014-04-11 NOTE — ED Notes (Signed)
Per ems, pt found slumped on side of road, c/o dizziness, hypotensive on scene.  Pt arrives awake, alert, oriented, no complaints at this time

## 2014-05-01 ENCOUNTER — Encounter (HOSPITAL_COMMUNITY): Payer: Self-pay | Admitting: Emergency Medicine

## 2014-05-01 ENCOUNTER — Emergency Department (HOSPITAL_COMMUNITY): Payer: Medicare Other

## 2014-05-01 DIAGNOSIS — F209 Schizophrenia, unspecified: Secondary | ICD-10-CM | POA: Diagnosis not present

## 2014-05-01 DIAGNOSIS — Z720 Tobacco use: Secondary | ICD-10-CM | POA: Insufficient documentation

## 2014-05-01 DIAGNOSIS — R079 Chest pain, unspecified: Secondary | ICD-10-CM | POA: Diagnosis present

## 2014-05-01 DIAGNOSIS — G8929 Other chronic pain: Secondary | ICD-10-CM | POA: Insufficient documentation

## 2014-05-01 DIAGNOSIS — J45901 Unspecified asthma with (acute) exacerbation: Secondary | ICD-10-CM | POA: Diagnosis not present

## 2014-05-01 DIAGNOSIS — Z7982 Long term (current) use of aspirin: Secondary | ICD-10-CM | POA: Diagnosis not present

## 2014-05-01 DIAGNOSIS — G40909 Epilepsy, unspecified, not intractable, without status epilepticus: Secondary | ICD-10-CM | POA: Insufficient documentation

## 2014-05-01 DIAGNOSIS — Z79899 Other long term (current) drug therapy: Secondary | ICD-10-CM | POA: Insufficient documentation

## 2014-05-01 DIAGNOSIS — Z8719 Personal history of other diseases of the digestive system: Secondary | ICD-10-CM | POA: Insufficient documentation

## 2014-05-01 DIAGNOSIS — I10 Essential (primary) hypertension: Secondary | ICD-10-CM | POA: Diagnosis not present

## 2014-05-01 LAB — BASIC METABOLIC PANEL
Anion gap: 11 (ref 5–15)
BUN: 7 mg/dL (ref 6–23)
CALCIUM: 9 mg/dL (ref 8.4–10.5)
CO2: 26 mmol/L (ref 19–32)
Chloride: 101 mmol/L (ref 96–112)
Creatinine, Ser: 0.8 mg/dL (ref 0.50–1.35)
GFR calc Af Amer: >90 mL/min (ref >90)
GLUCOSE: 99 mg/dL (ref 70–99)
POTASSIUM: 3.7 mmol/L (ref 3.5–5.1)
Sodium: 138 mmol/L (ref 135–145)

## 2014-05-01 LAB — CBC
HCT: 37.7 % — ABNORMAL LOW (ref 39.0–52.0)
Hemoglobin: 12.7 g/dL — ABNORMAL LOW (ref 13.0–17.0)
MCH: 30.7 pg (ref 26.0–34.0)
MCHC: 33.7 g/dL (ref 30.0–36.0)
MCV: 91.1 fL (ref 78.0–100.0)
PLATELETS: 241 10*3/uL (ref 150–400)
RBC: 4.14 MIL/uL — AB (ref 4.22–5.81)
RDW: 13.4 % (ref 11.5–15.5)
WBC: 7.1 10*3/uL (ref 4.0–10.5)

## 2014-05-01 LAB — BRAIN NATRIURETIC PEPTIDE: B Natriuretic Peptide: 21 pg/mL (ref 0.0–100.0)

## 2014-05-01 LAB — I-STAT TROPONIN, ED: Troponin i, poc: 0 ng/mL (ref 0.00–0.08)

## 2014-05-01 NOTE — ED Notes (Signed)
C/o L sided chest pressure, sob, productive cough with green phlegm, and feeling like he is going to have a seizure x 2 days.  Denies nausea and vomiting.

## 2014-05-02 ENCOUNTER — Emergency Department (HOSPITAL_COMMUNITY)
Admission: EM | Admit: 2014-05-02 | Discharge: 2014-05-02 | Disposition: A | Discharge status: Home / Self Care | Payer: Medicare Other | Attending: Emergency Medicine | Admitting: Emergency Medicine

## 2014-05-02 DIAGNOSIS — R079 Chest pain, unspecified: Secondary | ICD-10-CM | POA: Diagnosis not present

## 2014-05-02 LAB — VALPROIC ACID LEVEL: VALPROIC ACID LVL: 49.5 ug/mL — AB (ref 50.0–100.0)

## 2014-05-02 MED ORDER — IBUPROFEN 800 MG PO TABS
800.0000 mg | ORAL_TABLET | Freq: Once | ORAL | Status: AC
  Administered 2014-05-02: 800 mg via ORAL
  Filled 2014-05-02: qty 1

## 2014-05-02 NOTE — ED Provider Notes (Signed)
CSN: [id number]     Arrival date & time 05/01/14  2140 History  This chart was scribed for Delora Fuel, MD by Eustaquio Maize, ED Scribe. This patient was seen in room B15C/B15C and the patient's care was started at 1:32 AM.     Chief Complaint  Patient presents with  . Chest Pain   The history is provided by the patient. No language interpreter was used.     HPI Comments: Chase Blackwell is a 49 y.o. male with hx HTN, GERD, Schizophrenia, and Seizures who presents to the Emergency Department complaining of constant, sudden onset left sided chest pain that began 1 week ago, unchanged. Pt describes it as a dull pain. He cannot say if there are any modifying factors to the pain. He also complains of shortness of breath. Pt has not taken any pain medication this week. He reports that he feels like he is going to have a seizure. He has not taken his seizure medication lately. He states that he took it "the other day" but cannot specify what day it was. Denies nausea, vomiting, diaphoresis, or any other symptoms.    Past Medical History  Diagnosis Date  . Hypertension   . Asthma   . GERD (gastroesophageal reflux disease)   . Chronic pain   . Schizophrenia   . Alcohol abuse   . Seizures   . Pancreatitis    Past Surgical History  Procedure Laterality Date  . Left heart catheterization with coronary angiogram N/A 07/06/2013    Procedure: LEFT HEART CATHETERIZATION WITH CORONARY ANGIOGRAM;  Surgeon: Clent Demark, MD;  Location: [facility] CATH LAB;  Service: Cardiovascular;  Laterality: N/A;  . Percutaneous coronary stent intervention (pci-s)  07/06/2013    Procedure: PERCUTANEOUS CORONARY STENT INTERVENTION (PCI-S);  Surgeon: Clent Demark, MD;  Location: [facility] CATH LAB;  Service: Cardiovascular;;   Family History  Problem Relation Age of Onset  . Malignant hyperthermia Mother   . Cirrhosis Father   . Alcohol abuse Father    History  Substance Use Topics  . Smoking status: Current Every Day  Smoker -- 1.00 packs/day    Types: Cigarettes  . Smokeless tobacco: Not on file  . Alcohol Use: 1.2 oz/week    2 Cans of beer per week     Comment: daily heavy drinker    Review of Systems  Constitutional: Negative for diaphoresis.  Respiratory: Positive for shortness of breath.   Cardiovascular: Positive for chest pain.  Gastrointestinal: Negative for nausea and vomiting.  All other systems reviewed and are negative.     Allergies  Hctz; Hydroxyzine; Sulfonamide derivatives; and Cetirizine & related  Home Medications   Prior to Admission medications   Medication Sig Start Date End Date Taking? Authorizing Provider  albuterol (PROVENTIL HFA;VENTOLIN HFA) 108 (90 BASE) MCG/ACT inhaler Inhale 2 puffs into the lungs every 4 (four) hours as needed for wheezing or shortness of breath. Patient not taking: Reported on 05/02/2014 02/13/14   Elmyra Ricks Pisciotta, PA-C  aspirin 81 MG chewable tablet Chew 1 tablet (81 mg total) by mouth daily. Patient not taking: Reported on 05/02/2014 11/23/13   Kristen N Swindle, DO  atorvastatin (LIPITOR) 80 MG tablet Take 1 tablet (80 mg total) by mouth daily. Patient not taking: Reported on 05/02/2014 02/13/14   Elmyra Ricks Pisciotta, PA-C  clopidogrel (PLAVIX) 75 MG tablet Take 1 tablet (75 mg total) by mouth daily. Patient not taking: Reported on 05/02/2014 02/13/14   Elmyra Ricks Pisciotta, PA-C  divalproex (DEPAKOTE) 500 MG  DR tablet Take 1 tablet (500 mg total) by mouth 2 (two) times daily. Patient not taking: Reported on 05/02/2014 02/13/14   Elmyra Ricks Pisciotta, PA-C  famotidine (PEPCID) 20 MG tablet Take 1 tablet (20 mg total) by mouth 2 (two) times daily. Patient not taking: Reported on 05/02/2014 02/13/14   Elmyra Ricks Pisciotta, PA-C  folic acid (FOLVITE) 1 MG tablet Place 1 tablet (1 mg total) into feeding tube daily. Patient not taking: Reported on 05/02/2014 07/23/13   Donita Brooks, NP  furosemide (LASIX) 40 MG tablet Take 1 tablet (40 mg total) by mouth daily. Patient  not taking: Reported on 05/02/2014 02/13/14   Elmyra Ricks Pisciotta, PA-C  hydrALAZINE (APRESOLINE) 10 MG tablet Take 1 tablet (10 mg total) by mouth 2 (two) times daily. Patient not taking: Reported on 05/02/2014 02/13/14   Elmyra Ricks Pisciotta, PA-C  levETIRAcetam (KEPPRA) 1000 MG tablet Take 1 tablet (1,000 mg total) by mouth 2 (two) times daily. Patient not taking: Reported on 05/02/2014 02/13/14   Elmyra Ricks Pisciotta, PA-C  lisinopril (ZESTRIL) 2.5 MG tablet Take 1 tablet (2.5 mg total) by mouth daily. Patient not taking: Reported on 05/02/2014 02/13/14   Elmyra Ricks Pisciotta, PA-C  meloxicam (MOBIC) 15 MG tablet Take 1 tablet (15 mg total) by mouth daily. Patient not taking: Reported on 05/02/2014 11/13/13   Alvina Chou, PA-C  metoprolol tartrate (LOPRESSOR) 25 MG tablet Take 0.5 tablets (12.5 mg total) by mouth 2 (two) times daily. Patient not taking: Reported on 05/02/2014 02/13/14   Elmyra Ricks Pisciotta, PA-C  oxyCODONE (ROXICODONE) 5 MG immediate release tablet Take one tablet by mouth twice daily for pain Patient not taking: Reported on 05/02/2014 10/01/13   Tiffany L Reed, DO  QUEtiapine (SEROQUEL) 50 MG tablet Take 1 tablet (50 mg total) by mouth at bedtime. Patient not taking: Reported on 05/02/2014 02/13/14   Monico Blitz, PA-C   Triage Vitals: BP 102/75 mmHg  Pulse 68  Temp(Src) 98.7 F (37.1 C) (Oral)  Resp 16  Ht 6' (1.829 m)  Wt 150 lb (68.04 kg)  BMI 20.34 kg/m2  SpO2 99%   Physical Exam  Constitutional: He is oriented to person, place, and time. He appears well-developed and well-nourished. No distress.  HENT:  Head: Normocephalic and atraumatic.  Eyes: Conjunctivae and EOM are normal. Pupils are equal, round, and reactive to light.  Neck: Normal range of motion. Neck supple. No JVD present.  Cardiovascular: Normal rate, regular rhythm and normal heart sounds.   No murmur heard. Pulmonary/Chest: Effort normal and breath sounds normal. He has no wheezes. He has no rales. He exhibits no  tenderness.  Abdominal: Soft. Bowel sounds are normal. He exhibits no distension and no mass. There is no tenderness.  Musculoskeletal: Normal range of motion. He exhibits no edema.  Lymphadenopathy:    He has no cervical adenopathy.  Neurological: He is alert and oriented to person, place, and time. No cranial nerve deficit. Coordination normal.  Skin: Skin is warm and dry. No rash noted.  Psychiatric:  Flat affect. Slow to answer questions.   Nursing note and vitals reviewed.   ED Course  Procedures (including critical care time)  DIAGNOSTIC STUDIES: Oxygen Saturation is 99% on RA, normal by my interpretation.    COORDINATION OF CARE: 1:36 AM-Discussed treatment plan which includes pain medication with pt at bedside and pt agreed to plan.   Labs Review Results for orders placed or performed during the hospital encounter of 05/02/14  CBC  Result Value Ref Range   WBC 7.1 4.0 -  10.5 K/uL   RBC 4.14 (L) 4.22 - 5.81 MIL/uL   Hemoglobin 12.7 (L) 13.0 - 17.0 g/dL   HCT 37.7 (L) 39.0 - 52.0 %   MCV 91.1 78.0 - 100.0 fL   MCH 30.7 26.0 - 34.0 pg   MCHC 33.7 30.0 - 36.0 g/dL   RDW 13.4 11.5 - 15.5 %   Platelets 241 150 - 400 K/uL  Basic metabolic panel  Result Value Ref Range   Sodium 138 135 - 145 mmol/L   Potassium 3.7 3.5 - 5.1 mmol/L   Chloride 101 96 - 112 mmol/L   CO2 26 19 - 32 mmol/L   Glucose, Bld 99 70 - 99 mg/dL   BUN 7 6 - 23 mg/dL   Creatinine, Ser 0.80 0.50 - 1.35 mg/dL   Calcium 9.0 8.4 - 10.5 mg/dL   GFR calc non Af Amer >90 >90 mL/min   GFR calc Af Amer >90 >90 mL/min   Anion gap 11 5 - 15  BNP (order ONLY if patient complains of dyspnea/SOB AND you have documented it for THIS visit)  Result Value Ref Range   B Natriuretic Peptide 21.0 0.0 - 100.0 pg/mL  Valproic acid level  Result Value Ref Range   Valproic Acid Lvl 49.5 (L) 50.0 - 100.0 ug/mL  I-stat troponin, ED (not at [facility])  Result Value Ref Range   Troponin i, poc 0.00 0.00 - 0.08 ng/mL   Comment  3           Imaging Review Dg Chest 2 View  05/01/2014   CLINICAL DATA:  Acute onset of cough, shortness of breath and chest pain. Initial encounter.  EXAM: CHEST  2 VIEW  COMPARISON:  Chest radiograph performed 04/11/2014  FINDINGS: The lungs are well-aerated and clear. There is no evidence of focal opacification, pleural effusion or pneumothorax.  The heart is normal in size; the mediastinal contour is within normal limits. No acute osseous abnormalities are seen.  IMPRESSION: No acute cardiopulmonary process seen.   Electronically Signed   By: Garald Balding M.D.   On: 05/01/2014 22:14     EKG Interpretation   Date/Time:  Wednesday May 01 2014 21:48:14 EDT Ventricular Rate:  82 PR Interval:  168 QRS Duration: 88 QT Interval:  370 QTC Calculation: 432 R Axis:   74 Text Interpretation:  Normal sinus rhythm Normal ECG When compared with  ECG of 04/11/2014, No significant change was found Confirmed by [facility]  MD,  Emireth Cockerham ([id number]) on 05/02/2014 1:03:37 AM      MDM   Final diagnoses:  Chest pain, unspecified chest pain type    Chest pain which is probably musculoskeletal. Character of pain is not worrisome for cardiac pain or pulmonary embolism. ECG is normal and troponin is negative. His blood level of valproic acid was at the lower end of therapeutic range. I went back to discuss this with the patient and he was sleeping soundly. He is encouraged to make sure he stays compliant with all of his medications and is to follow-up with his PCP.   I personally performed the services described in this documentation, which was scribed in my presence. The recorded information has been reviewed and is accurate.       Delora Fuel, MD 22/29/79 [id number]

## 2014-05-02 NOTE — Discharge Instructions (Signed)
Make sure to take all of your medications as prescribed.   Chest Pain (Nonspecific) It is often hard to give a specific diagnosis for the cause of chest pain. There is always a chance that your pain could be related to something serious, such as a heart attack or a blood clot in the lungs. You need to follow up with your health care provider for further evaluation. CAUSES   Heartburn.  Pneumonia or bronchitis.  Anxiety or stress.  Inflammation around your heart (pericarditis) or lung (pleuritis or pleurisy).  A blood clot in the lung.  A collapsed lung (pneumothorax). It can develop suddenly on its own (spontaneous pneumothorax) or from trauma to the chest.  Shingles infection (herpes zoster virus). The chest wall is composed of bones, muscles, and cartilage. Any of these can be the source of the pain.  The bones can be bruised by injury.  The muscles or cartilage can be strained by coughing or overwork.  The cartilage can be affected by inflammation and become sore (costochondritis). DIAGNOSIS  Lab tests or other studies may be needed to find the cause of your pain. Your health care provider may have you take a test called an ambulatory electrocardiogram (ECG). An ECG records your heartbeat patterns over a 24-hour period. You may also have other tests, such as:  Transthoracic echocardiogram (TTE). During echocardiography, sound waves are used to evaluate how blood flows through your heart.  Transesophageal echocardiogram (TEE).  Cardiac monitoring. This allows your health care provider to monitor your heart rate and rhythm in real time.  Holter monitor. This is a portable device that records your heartbeat and can help diagnose heart arrhythmias. It allows your health care provider to track your heart activity for several days, if needed.  Stress tests by exercise or by giving medicine that makes the heart beat faster. TREATMENT   Treatment depends on what may be causing  your chest pain. Treatment may include:  Acid blockers for heartburn.  Anti-inflammatory medicine.  Pain medicine for inflammatory conditions.  Antibiotics if an infection is present.  You may be advised to change lifestyle habits. This includes stopping smoking and avoiding alcohol, caffeine, and chocolate.  You may be advised to keep your head raised (elevated) when sleeping. This reduces the chance of acid going backward from your stomach into your esophagus. Most of the time, nonspecific chest pain will improve within 2-3 days with rest and mild pain medicine.  HOME CARE INSTRUCTIONS   If antibiotics were prescribed, take them as directed. Finish them even if you start to feel better.  For the next few days, avoid physical activities that bring on chest pain. Continue physical activities as directed.  Do not use any tobacco products, including cigarettes, chewing tobacco, or electronic cigarettes.  Avoid drinking alcohol.  Only take medicine as directed by your health care provider.  Follow your health care provider's suggestions for further testing if your chest pain does not go away.  Keep any follow-up appointments you made. If you do not go to an appointment, you could develop lasting (chronic) problems with pain. If there is any problem keeping an appointment, call to reschedule. SEEK MEDICAL CARE IF:   Your chest pain does not go away, even after treatment.  You have a rash with blisters on your chest.  You have a fever. SEEK IMMEDIATE MEDICAL CARE IF:   You have increased chest pain or pain that spreads to your arm, neck, jaw, back, or abdomen.  You have  shortness of breath.  You have an increasing cough, or you cough up blood.  You have severe back or abdominal pain.  You feel nauseous or vomit.  You have severe weakness.  You faint.  You have chills. This is an emergency. Do not wait to see if the pain will go away. Get medical help at once. Call your  local emergency services (911 in [place] Do not drive yourself to the hospital. MAKE SURE YOU:   Understand these instructions.  Will watch your condition.  Will get help right away if you are not doing well or get worse. Document Released: 09/30/2004 Document Revised: 12/26/2012 Document Reviewed: 07/27/2007 [facility] Patient Information 2015 [place], [place]. This information is not intended to replace advice given to you by your health care provider. Make sure you discuss any questions you have with your health care provider.

## 2014-06-06 ENCOUNTER — Emergency Department (HOSPITAL_COMMUNITY): Payer: Medicare Other

## 2014-06-06 ENCOUNTER — Inpatient Hospital Stay (HOSPITAL_COMMUNITY)
Admission: EM | Admit: 2014-06-06 | Discharge: 2014-06-07 | DRG: 439 | Discharge status: Against Medical Advice | Payer: Medicare Other | Attending: Internal Medicine | Admitting: Internal Medicine

## 2014-06-06 ENCOUNTER — Encounter (HOSPITAL_COMMUNITY): Payer: Self-pay | Admitting: Emergency Medicine

## 2014-06-06 DIAGNOSIS — G8929 Other chronic pain: Secondary | ICD-10-CM | POA: Diagnosis present

## 2014-06-06 DIAGNOSIS — F1910 Other psychoactive substance abuse, uncomplicated: Secondary | ICD-10-CM | POA: Diagnosis present

## 2014-06-06 DIAGNOSIS — F200 Paranoid schizophrenia: Secondary | ICD-10-CM | POA: Diagnosis present

## 2014-06-06 DIAGNOSIS — R079 Chest pain, unspecified: Secondary | ICD-10-CM

## 2014-06-06 DIAGNOSIS — F17210 Nicotine dependence, cigarettes, uncomplicated: Secondary | ICD-10-CM | POA: Diagnosis present

## 2014-06-06 DIAGNOSIS — I10 Essential (primary) hypertension: Secondary | ICD-10-CM | POA: Diagnosis present

## 2014-06-06 DIAGNOSIS — K210 Gastro-esophageal reflux disease with esophagitis: Secondary | ICD-10-CM | POA: Diagnosis present

## 2014-06-06 DIAGNOSIS — K860 Alcohol-induced chronic pancreatitis: Secondary | ICD-10-CM | POA: Diagnosis present

## 2014-06-06 DIAGNOSIS — K859 Acute pancreatitis without necrosis or infection, unspecified: Secondary | ICD-10-CM

## 2014-06-06 DIAGNOSIS — Z681 Body mass index (BMI) 19 or less, adult: Secondary | ICD-10-CM

## 2014-06-06 DIAGNOSIS — Z951 Presence of aortocoronary bypass graft: Secondary | ICD-10-CM

## 2014-06-06 DIAGNOSIS — E46 Unspecified protein-calorie malnutrition: Secondary | ICD-10-CM | POA: Diagnosis present

## 2014-06-06 DIAGNOSIS — R569 Unspecified convulsions: Secondary | ICD-10-CM

## 2014-06-06 DIAGNOSIS — K219 Gastro-esophageal reflux disease without esophagitis: Secondary | ICD-10-CM | POA: Diagnosis present

## 2014-06-06 DIAGNOSIS — E785 Hyperlipidemia, unspecified: Secondary | ICD-10-CM | POA: Diagnosis present

## 2014-06-06 DIAGNOSIS — F1290 Cannabis use, unspecified, uncomplicated: Secondary | ICD-10-CM | POA: Diagnosis present

## 2014-06-06 DIAGNOSIS — Z955 Presence of coronary angioplasty implant and graft: Secondary | ICD-10-CM

## 2014-06-06 DIAGNOSIS — J45909 Unspecified asthma, uncomplicated: Secondary | ICD-10-CM | POA: Diagnosis present

## 2014-06-06 DIAGNOSIS — E7800 Pure hypercholesterolemia, unspecified: Secondary | ICD-10-CM | POA: Diagnosis present

## 2014-06-06 DIAGNOSIS — F329 Major depressive disorder, single episode, unspecified: Secondary | ICD-10-CM | POA: Diagnosis present

## 2014-06-06 DIAGNOSIS — I2119 ST elevation (STEMI) myocardial infarction involving other coronary artery of inferior wall: Secondary | ICD-10-CM | POA: Diagnosis present

## 2014-06-06 DIAGNOSIS — I2510 Atherosclerotic heart disease of native coronary artery without angina pectoris: Secondary | ICD-10-CM | POA: Diagnosis present

## 2014-06-06 DIAGNOSIS — K852 Alcohol induced acute pancreatitis: Principal | ICD-10-CM | POA: Diagnosis present

## 2014-06-06 DIAGNOSIS — Z7902 Long term (current) use of antithrombotics/antiplatelets: Secondary | ICD-10-CM

## 2014-06-06 DIAGNOSIS — I252 Old myocardial infarction: Secondary | ICD-10-CM

## 2014-06-06 DIAGNOSIS — Z720 Tobacco use: Secondary | ICD-10-CM | POA: Diagnosis present

## 2014-06-06 DIAGNOSIS — I25810 Atherosclerosis of coronary artery bypass graft(s) without angina pectoris: Secondary | ICD-10-CM | POA: Diagnosis present

## 2014-06-06 DIAGNOSIS — Z79899 Other long term (current) drug therapy: Secondary | ICD-10-CM

## 2014-06-06 DIAGNOSIS — Z7982 Long term (current) use of aspirin: Secondary | ICD-10-CM

## 2014-06-06 DIAGNOSIS — Z9114 Patient's other noncompliance with medication regimen: Secondary | ICD-10-CM | POA: Diagnosis present

## 2014-06-06 DIAGNOSIS — F1010 Alcohol abuse, uncomplicated: Secondary | ICD-10-CM | POA: Diagnosis present

## 2014-06-06 DIAGNOSIS — K861 Other chronic pancreatitis: Secondary | ICD-10-CM

## 2014-06-06 HISTORY — DX: Major depressive disorder, single episode, unspecified: F32.9

## 2014-06-06 HISTORY — DX: Depression, unspecified: F32.A

## 2014-06-06 HISTORY — DX: Atherosclerotic heart disease of native coronary artery without angina pectoris: I25.10

## 2014-06-06 LAB — I-STAT TROPONIN, ED
Troponin i, poc: 0 ng/mL (ref 0.00–0.08)
Troponin i, poc: 0 ng/mL (ref 0.00–0.08)

## 2014-06-06 LAB — BASIC METABOLIC PANEL
Anion gap: 10 (ref 5–15)
BUN: 7 mg/dL (ref 6–20)
CALCIUM: 8.9 mg/dL (ref 8.9–10.3)
CO2: 26 mmol/L (ref 22–32)
CREATININE: 0.74 mg/dL (ref 0.61–1.24)
Chloride: 101 mmol/L (ref 101–111)
GFR calc Af Amer: >60 mL/min (ref >60)
Glucose, Bld: 97 mg/dL (ref 65–99)
Potassium: 3.6 mmol/L (ref 3.5–5.1)
SODIUM: 137 mmol/L (ref 135–145)

## 2014-06-06 LAB — CBC
HEMATOCRIT: 36.9 % — AB (ref 39.0–52.0)
HEMOGLOBIN: 12.7 g/dL — AB (ref 13.0–17.0)
MCH: 31.3 pg (ref 26.0–34.0)
MCHC: 34.4 g/dL (ref 30.0–36.0)
MCV: 90.9 fL (ref 78.0–100.0)
Platelets: 246 10*3/uL (ref 150–400)
RBC: 4.06 MIL/uL — AB (ref 4.22–5.81)
RDW: 13.9 % (ref 11.5–15.5)
WBC: 8.4 10*3/uL (ref 4.0–10.5)

## 2014-06-06 MED ORDER — SODIUM CHLORIDE 0.9 % IV SOLN
1000.0000 mL | INTRAVENOUS | Status: DC
  Administered 2014-06-06: 1000 mL via INTRAVENOUS

## 2014-06-06 MED ORDER — NITROGLYCERIN 2 % TD OINT
1.0000 [in_us] | TOPICAL_OINTMENT | Freq: Once | TRANSDERMAL | Status: AC
  Administered 2014-06-06: 1 [in_us] via TOPICAL
  Filled 2014-06-06: qty 1

## 2014-06-06 MED ORDER — NITROGLYCERIN 0.4 MG SL SUBL: 0.4000 mg | SUBLINGUAL_TABLET | SUBLINGUAL | Status: DC | PRN

## 2014-06-06 MED ORDER — ASPIRIN 81 MG PO CHEW
324.0000 mg | CHEWABLE_TABLET | Freq: Once | ORAL | Status: AC
  Administered 2014-06-06: 324 mg via ORAL
  Filled 2014-06-06: qty 4

## 2014-06-06 NOTE — ED Provider Notes (Signed)
CSN: [id number]     Arrival date & time 06/06/14  2150 History   First MD Initiated Contact with Patient 06/06/14 2224     Chief Complaint  Patient presents with  . Chest Pain   Patient is a 49 y.o. male presenting with chest pain. The history is provided by the patient.  Chest Pain Pain location:  L chest Pain quality: dull   Pain radiates to:  Does not radiate Pain severity:  Moderate Onset quality:  Gradual Duration:  1 month Timing:  Intermittent (unable to tell me how long it lasts for but it does come ang go) Progression:  Unchanged Relieved by:  Nothing Worsened by:  Nothing tried Ineffective treatments:  None tried Associated symptoms: cough and nausea   Associated symptoms: no shortness of breath and not vomiting     Past Medical History  Diagnosis Date  . Hypertension   . Asthma   . GERD (gastroesophageal reflux disease)   . Chronic pain   . Schizophrenia   . Alcohol abuse   . Seizures   . Pancreatitis    Past Surgical History  Procedure Laterality Date  . Left heart catheterization with coronary angiogram N/A 07/06/2013    Procedure: LEFT HEART CATHETERIZATION WITH CORONARY ANGIOGRAM;  Surgeon: Clent Demark, MD;  Location: [facility] CATH LAB;  Service: Cardiovascular;  Laterality: N/A;  . Percutaneous coronary stent intervention (pci-s)  07/06/2013    Procedure: PERCUTANEOUS CORONARY STENT INTERVENTION (PCI-S);  Surgeon: Clent Demark, MD;  [facility] CATH LAB;  Service: Cardiovascular;;   Family History  Problem Relation Age of Onset  . Malignant hyperthermia Mother   . Cirrhosis Father   . Alcohol abuse Father    History  Substance Use Topics  . Smoking status: Current Every Day Smoker -- 0.00 packs/day    Types: Cigarettes  . Smokeless tobacco: Not on file  . Alcohol Use: Yes     Comment: daily heavy drinker    Review of Systems  Respiratory: Positive for cough. Negative for shortness of breath.   Cardiovascular: Positive for chest pain.   Gastrointestinal: Positive for nausea. Negative for vomiting.  All other systems reviewed and are negative.     Allergies  Hctz; Hydroxyzine; Sulfonamide derivatives; and Cetirizine & related  Home Medications   Prior to Admission medications   Medication Sig Start Date End Date Taking? Authorizing Provider  traZODone (DESYREL) 100 MG tablet Take 1 tablet by mouth at bedtime as needed. 05/13/14  Yes Historical Provider, MD  albuterol (PROVENTIL HFA;VENTOLIN HFA) 108 (90 BASE) MCG/ACT inhaler Inhale 2 puffs into the lungs every 4 (four) hours as needed for wheezing or shortness of breath. Patient not taking: Reported on 05/02/2014 02/13/14   Elmyra Ricks Pisciotta, PA-C  aspirin 81 MG chewable tablet Chew 1 tablet (81 mg total) by mouth daily. Patient not taking: Reported on 05/02/2014 11/23/13   Kristen N Pehl, DO  atorvastatin (LIPITOR) 80 MG tablet Take 1 tablet (80 mg total) by mouth daily. Patient not taking: Reported on 05/02/2014 02/13/14   Elmyra Ricks Pisciotta, PA-C  clopidogrel (PLAVIX) 75 MG tablet Take 1 tablet (75 mg total) by mouth daily. Patient not taking: Reported on 05/02/2014 02/13/14   Elmyra Ricks Pisciotta, PA-C  divalproex (DEPAKOTE) 500 MG DR tablet Take 1 tablet (500 mg total) by mouth 2 (two) times daily. Patient not taking: Reported on 05/02/2014 02/13/14   Elmyra Ricks Pisciotta, PA-C  famotidine (PEPCID) 20 MG tablet Take 1 tablet (20 mg total) by mouth 2 (two) times  daily. Patient not taking: Reported on 05/02/2014 02/13/14   Elmyra Ricks Pisciotta, PA-C  folic acid (FOLVITE) 1 MG tablet Place 1 tablet (1 mg total) into feeding tube daily. Patient not taking: Reported on 05/02/2014 07/23/13   Donita Brooks, NP  furosemide (LASIX) 40 MG tablet Take 1 tablet (40 mg total) by mouth daily. Patient not taking: Reported on 05/02/2014 02/13/14   Elmyra Ricks Pisciotta, PA-C  hydrALAZINE (APRESOLINE) 10 MG tablet Take 1 tablet (10 mg total) by mouth 2 (two) times daily. Patient not taking: Reported on 05/02/2014  02/13/14   Elmyra Ricks Pisciotta, PA-C  levETIRAcetam (KEPPRA) 1000 MG tablet Take 1 tablet (1,000 mg total) by mouth 2 (two) times daily. Patient not taking: Reported on 05/02/2014 02/13/14   Elmyra Ricks Pisciotta, PA-C  lisinopril (ZESTRIL) 2.5 MG tablet Take 1 tablet (2.5 mg total) by mouth daily. Patient not taking: Reported on 05/02/2014 02/13/14   Elmyra Ricks Pisciotta, PA-C  meloxicam (MOBIC) 15 MG tablet Take 1 tablet (15 mg total) by mouth daily. Patient not taking: Reported on 05/02/2014 11/13/13   Alvina Chou, PA-C  metoprolol tartrate (LOPRESSOR) 25 MG tablet Take 0.5 tablets (12.5 mg total) by mouth 2 (two) times daily. Patient not taking: Reported on 05/02/2014 02/13/14   Elmyra Ricks Pisciotta, PA-C  oxyCODONE (ROXICODONE) 5 MG immediate release tablet Take one tablet by mouth twice daily for pain Patient not taking: Reported on 05/02/2014 10/01/13   Tiffany L Reed, DO  QUEtiapine (SEROQUEL) 50 MG tablet Take 1 tablet (50 mg total) by mouth at bedtime. Patient not taking: Reported on 05/02/2014 02/13/14   Elmyra Ricks Pisciotta, PA-C   BP 113/53 mmHg  Pulse 59  Temp(Src) 98.4 F (36.9 C) (Oral)  Resp 14  SpO2 83% Physical Exam  Constitutional: No distress.  HENT:  Head: Normocephalic and atraumatic.  Right Ear: External ear normal.  Left Ear: External ear normal.  Eyes: Conjunctivae are normal. Right eye exhibits no discharge. Left eye exhibits no discharge. No scleral icterus.  Neck: Neck supple. No tracheal deviation present.  Cardiovascular: Normal rate, regular rhythm and intact distal pulses.   Pulmonary/Chest: Effort normal and breath sounds normal. No stridor. No respiratory distress. He has no wheezes. He has no rales.  Abdominal: Soft. Bowel sounds are normal. He exhibits no distension. There is tenderness (mild epigastric). There is no rebound and no guarding.  Musculoskeletal: He exhibits no edema or tenderness.  Neurological: He is alert. He has normal strength. No cranial nerve deficit (no  facial droop, extraocular movements intact, no slurred speech) or sensory deficit. He exhibits normal muscle tone. He displays no seizure activity. Coordination normal.  Skin: Skin is warm and dry. No rash noted.  Psychiatric: His affect is blunt. His speech is not delayed and not tangential. He is slowed.  Nursing note and vitals reviewed.   ED Course  Procedures (including critical care time) Labs Review Labs Reviewed  CBC - Abnormal; Notable for the following:    RBC 4.06 (*)    Hemoglobin 12.7 (*)    HCT 36.9 (*)    All other components within normal limits  BASIC METABOLIC PANEL  BRAIN NATRIURETIC PEPTIDE  APTT  PROTIME-INR  LIPASE, BLOOD  ETHANOL  HEPATIC FUNCTION PANEL  I-STAT TROPOININ, ED  Randolm Idol, ED    Imaging Review Dg Chest 2 View  06/06/2014   CLINICAL DATA:  Central chest pain with shortness of breath and nausea since this evening.  EXAM: CHEST  2 VIEW  COMPARISON:  05/01/2014  FINDINGS: Lungs are  hyperinflated, unchanged. The cardiomediastinal contours are normal. Pulmonary vasculature is normal. No consolidation, pleural effusion, or pneumothorax. No acute osseous abnormalities are seen.  IMPRESSION: No acute pulmonary process.   Electronically Signed   By: Jeb Levering M.D.   On: 06/06/2014 22:32   Dg Chest Portable 1 View  06/06/2014   CLINICAL DATA:  Chest pain and shortness of breath for 1 day.  EXAM: PORTABLE CHEST - 1 VIEW  COMPARISON:  Earlier this day at 2233 hour  FINDINGS: The cardiomediastinal contours are normal. Unchanged hyperinflation. Pulmonary vasculature is normal. No consolidation, pleural effusion, or pneumothorax. No acute osseous abnormalities are seen.  IMPRESSION: No change from prior exam.   Electronically Signed   By: Jeb Levering M.D.   On: 06/06/2014 23:26     EKG Interpretation   Date/Time:  Thursday June 06 2014 22:00:12 EDT Ventricular Rate:  77 PR Interval:  172 QRS Duration: 90 QT Interval:  382 QTC  Calculation: 432 R Axis:   84 Text Interpretation:  Normal sinus rhythm Normal ECG No significant change  since last tracing Confirmed by Braylinn Gulden  MD-J, Otto Caraway ([id number]) on 06/06/2014  10:26:07 PM      Medications  0.9 %  sodium chloride infusion (1,000 mLs Intravenous New Bag/Given 06/06/14 2316)  nitroGLYCERIN (NITROSTAT) SL tablet 0.4 mg (not administered)  aspirin chewable tablet 324 mg (324 mg Oral Given 06/06/14 2305)  nitroGLYCERIN (NITROGLYN) 2 % ointment 1 inch (1 inch Topical Given 06/06/14 2316)    MDM   Final diagnoses:  Chest pain, unspecified chest pain type    Old records reviewed.  Pt had a stemi in July of last year with stents placed.  Pt is a poor historian but his symptoms are concerning.  Plan on checking LFTs, lipase because he has been drinking and has had pancreatitis in the past.   Anticipate he will need to be admitted for serial enzymes and monitoring.    Dorie Rank, MD 06/07/14 0000

## 2014-06-06 NOTE — ED Notes (Signed)
Pt. arrived with EMS from street reports central chest pain with SOB and mild nausea onset this evening , denies emesis or diaphoresis , pt. stated that he drank ETOH and smoked marijuana this evening .

## 2014-06-06 NOTE — ED Notes (Signed)
Pt is in xray

## 2014-06-07 ENCOUNTER — Encounter (HOSPITAL_COMMUNITY): Payer: Self-pay | Admitting: Internal Medicine

## 2014-06-07 DIAGNOSIS — I2510 Atherosclerotic heart disease of native coronary artery without angina pectoris: Secondary | ICD-10-CM | POA: Diagnosis present

## 2014-06-07 DIAGNOSIS — G8929 Other chronic pain: Secondary | ICD-10-CM | POA: Diagnosis present

## 2014-06-07 DIAGNOSIS — R569 Unspecified convulsions: Secondary | ICD-10-CM

## 2014-06-07 DIAGNOSIS — F1290 Cannabis use, unspecified, uncomplicated: Secondary | ICD-10-CM | POA: Diagnosis present

## 2014-06-07 DIAGNOSIS — Z7982 Long term (current) use of aspirin: Secondary | ICD-10-CM | POA: Diagnosis not present

## 2014-06-07 DIAGNOSIS — K861 Other chronic pancreatitis: Secondary | ICD-10-CM | POA: Diagnosis present

## 2014-06-07 DIAGNOSIS — F1910 Other psychoactive substance abuse, uncomplicated: Secondary | ICD-10-CM | POA: Diagnosis not present

## 2014-06-07 DIAGNOSIS — F17210 Nicotine dependence, cigarettes, uncomplicated: Secondary | ICD-10-CM | POA: Diagnosis present

## 2014-06-07 DIAGNOSIS — I10 Essential (primary) hypertension: Secondary | ICD-10-CM | POA: Diagnosis present

## 2014-06-07 DIAGNOSIS — I252 Old myocardial infarction: Secondary | ICD-10-CM | POA: Diagnosis not present

## 2014-06-07 DIAGNOSIS — E780 Pure hypercholesterolemia: Secondary | ICD-10-CM

## 2014-06-07 DIAGNOSIS — F200 Paranoid schizophrenia: Secondary | ICD-10-CM

## 2014-06-07 DIAGNOSIS — F1010 Alcohol abuse, uncomplicated: Secondary | ICD-10-CM | POA: Diagnosis present

## 2014-06-07 DIAGNOSIS — K852 Alcohol induced acute pancreatitis: Secondary | ICD-10-CM | POA: Diagnosis present

## 2014-06-07 DIAGNOSIS — Z955 Presence of coronary angioplasty implant and graft: Secondary | ICD-10-CM | POA: Diagnosis not present

## 2014-06-07 DIAGNOSIS — Z79899 Other long term (current) drug therapy: Secondary | ICD-10-CM | POA: Diagnosis not present

## 2014-06-07 DIAGNOSIS — R079 Chest pain, unspecified: Secondary | ICD-10-CM | POA: Diagnosis present

## 2014-06-07 DIAGNOSIS — K219 Gastro-esophageal reflux disease without esophagitis: Secondary | ICD-10-CM | POA: Diagnosis present

## 2014-06-07 DIAGNOSIS — F209 Schizophrenia, unspecified: Secondary | ICD-10-CM | POA: Insufficient documentation

## 2014-06-07 DIAGNOSIS — F329 Major depressive disorder, single episode, unspecified: Secondary | ICD-10-CM | POA: Diagnosis present

## 2014-06-07 DIAGNOSIS — Z681 Body mass index (BMI) 19 or less, adult: Secondary | ICD-10-CM | POA: Diagnosis not present

## 2014-06-07 DIAGNOSIS — K860 Alcohol-induced chronic pancreatitis: Secondary | ICD-10-CM | POA: Diagnosis present

## 2014-06-07 DIAGNOSIS — E46 Unspecified protein-calorie malnutrition: Secondary | ICD-10-CM | POA: Diagnosis present

## 2014-06-07 DIAGNOSIS — Z9114 Patient's other noncompliance with medication regimen: Secondary | ICD-10-CM | POA: Diagnosis present

## 2014-06-07 DIAGNOSIS — K210 Gastro-esophageal reflux disease with esophagitis: Secondary | ICD-10-CM | POA: Diagnosis present

## 2014-06-07 DIAGNOSIS — E785 Hyperlipidemia, unspecified: Secondary | ICD-10-CM | POA: Diagnosis present

## 2014-06-07 DIAGNOSIS — Z951 Presence of aortocoronary bypass graft: Secondary | ICD-10-CM | POA: Diagnosis not present

## 2014-06-07 DIAGNOSIS — J45909 Unspecified asthma, uncomplicated: Secondary | ICD-10-CM | POA: Diagnosis present

## 2014-06-07 DIAGNOSIS — Z7902 Long term (current) use of antithrombotics/antiplatelets: Secondary | ICD-10-CM | POA: Diagnosis not present

## 2014-06-07 DIAGNOSIS — Z720 Tobacco use: Secondary | ICD-10-CM

## 2014-06-07 LAB — RAPID URINE DRUG SCREEN, HOSP PERFORMED
Amphetamines: NOT DETECTED
Barbiturates: NOT DETECTED
Benzodiazepines: NOT DETECTED
COCAINE: NOT DETECTED
Opiates: NOT DETECTED
TETRAHYDROCANNABINOL: POSITIVE — AB

## 2014-06-07 LAB — HEPATIC FUNCTION PANEL
ALT: 10 U/L — ABNORMAL LOW (ref 17–63)
AST: 13 U/L — ABNORMAL LOW (ref 15–41)
Albumin: 3.3 g/dL — ABNORMAL LOW (ref 3.5–5.0)
Alkaline Phosphatase: 47 U/L (ref 38–126)
BILIRUBIN DIRECT: 0.2 mg/dL (ref 0.1–0.5)
BILIRUBIN INDIRECT: 0.4 mg/dL (ref 0.3–0.9)
Total Bilirubin: 0.6 mg/dL (ref 0.3–1.2)
Total Protein: 6.1 g/dL — ABNORMAL LOW (ref 6.5–8.1)

## 2014-06-07 LAB — BASIC METABOLIC PANEL
ANION GAP: 7 (ref 5–15)
BUN: 8 mg/dL (ref 6–20)
CALCIUM: 8.3 mg/dL — AB (ref 8.9–10.3)
CO2: 26 mmol/L (ref 22–32)
CREATININE: 0.67 mg/dL (ref 0.61–1.24)
Chloride: 109 mmol/L (ref 101–111)
GFR calc Af Amer: >60 mL/min (ref >60)
GLUCOSE: 105 mg/dL — AB (ref 65–99)
POTASSIUM: 4 mmol/L (ref 3.5–5.1)
Sodium: 142 mmol/L (ref 135–145)

## 2014-06-07 LAB — CBC
HCT: 33.9 % — ABNORMAL LOW (ref 39.0–52.0)
HEMOGLOBIN: 11.2 g/dL — AB (ref 13.0–17.0)
MCH: 30.5 pg (ref 26.0–34.0)
MCHC: 33 g/dL (ref 30.0–36.0)
MCV: 92.4 fL (ref 78.0–100.0)
Platelets: 194 10*3/uL (ref 150–400)
RBC: 3.67 MIL/uL — AB (ref 4.22–5.81)
RDW: 14.2 % (ref 11.5–15.5)
WBC: 6.3 10*3/uL (ref 4.0–10.5)

## 2014-06-07 LAB — I-STAT TROPONIN, ED: Troponin i, poc: 0 ng/mL (ref 0.00–0.08)

## 2014-06-07 LAB — LIPASE, BLOOD: Lipase: 177 U/L — ABNORMAL HIGH (ref 22–51)

## 2014-06-07 LAB — PROTIME-INR
INR: 1.09 (ref 0.00–1.49)
Prothrombin Time: 14.3 seconds (ref 11.6–15.2)

## 2014-06-07 LAB — APTT: APTT: 34 s (ref 24–37)

## 2014-06-07 LAB — BRAIN NATRIURETIC PEPTIDE: B Natriuretic Peptide: 28.5 pg/mL (ref 0.0–100.0)

## 2014-06-07 LAB — SALICYLATE LEVEL: Salicylate Lvl: <4 mg/dL (ref 2.8–30.0)

## 2014-06-07 LAB — GLUCOSE, CAPILLARY: Glucose-Capillary: 112 mg/dL — ABNORMAL HIGH (ref 65–99)

## 2014-06-07 LAB — TROPONIN I: Troponin I: <0.03 ng/mL (ref <0.031)

## 2014-06-07 LAB — ETHANOL: Alcohol, Ethyl (B): <5 mg/dL (ref <5)

## 2014-06-07 MED ORDER — THIAMINE HCL 100 MG/ML IJ SOLN
100.0000 mg | Freq: Every day | INTRAMUSCULAR | Status: DC
  Filled 2014-06-07: qty 1

## 2014-06-07 MED ORDER — DIVALPROEX SODIUM 500 MG PO DR TAB
500.0000 mg | DELAYED_RELEASE_TABLET | Freq: Two times a day (BID) | ORAL | Status: DC
  Filled 2014-06-07 (×2): qty 1

## 2014-06-07 MED ORDER — SODIUM CHLORIDE 0.9 % IV SOLN
1000.0000 mg | Freq: Once | INTRAVENOUS | Status: AC
  Administered 2014-06-07: 1000 mg via INTRAVENOUS
  Filled 2014-06-07: qty 10

## 2014-06-07 MED ORDER — TRAZODONE HCL 100 MG PO TABS
100.0000 mg | ORAL_TABLET | Freq: Every evening | ORAL | Status: DC | PRN
  Filled 2014-06-07: qty 1

## 2014-06-07 MED ORDER — SODIUM CHLORIDE 0.9 % IJ SOLN: 3.0000 mL | Freq: Two times a day (BID) | INTRAMUSCULAR | Status: DC

## 2014-06-07 MED ORDER — ENSURE ENLIVE PO LIQD: 237.0000 mL | Freq: Two times a day (BID) | ORAL | Status: DC

## 2014-06-07 MED ORDER — ALBUTEROL SULFATE (2.5 MG/3ML) 0.083% IN NEBU: 2.5000 mg | INHALATION_SOLUTION | RESPIRATORY_TRACT | Status: DC | PRN

## 2014-06-07 MED ORDER — NICOTINE 21 MG/24HR TD PT24
21.0000 mg | MEDICATED_PATCH | Freq: Every day | TRANSDERMAL | Status: DC
  Filled 2014-06-07: qty 1

## 2014-06-07 MED ORDER — METOPROLOL TARTRATE 12.5 MG HALF TABLET
12.5000 mg | ORAL_TABLET | Freq: Two times a day (BID) | ORAL | Status: DC
  Filled 2014-06-07 (×2): qty 1

## 2014-06-07 MED ORDER — ONDANSETRON HCL 4 MG/2ML IJ SOLN: 4.0000 mg | Freq: Four times a day (QID) | INTRAMUSCULAR | Status: DC | PRN

## 2014-06-07 MED ORDER — LORAZEPAM 2 MG/ML IJ SOLN: 1.0000 mg | Freq: Four times a day (QID) | INTRAMUSCULAR | Status: DC | PRN

## 2014-06-07 MED ORDER — MORPHINE SULFATE 2 MG/ML IJ SOLN: 1.0000 mg | INTRAMUSCULAR | Status: DC | PRN

## 2014-06-07 MED ORDER — ACETAMINOPHEN 325 MG PO TABS
650.0000 mg | ORAL_TABLET | Freq: Four times a day (QID) | ORAL | Status: DC | PRN
Start: 2014-06-07 — End: 2014-06-07

## 2014-06-07 MED ORDER — FOLIC ACID 1 MG PO TABS
1.0000 mg | ORAL_TABLET | Freq: Every day | ORAL | Status: DC
  Filled 2014-06-07: qty 1

## 2014-06-07 MED ORDER — ONDANSETRON HCL 4 MG PO TABS: 4.0000 mg | ORAL_TABLET | Freq: Four times a day (QID) | ORAL | Status: DC | PRN

## 2014-06-07 MED ORDER — NITROGLYCERIN 2 % TD OINT
1.0000 [in_us] | TOPICAL_OINTMENT | Freq: Three times a day (TID) | TRANSDERMAL | Status: DC | PRN
  Filled 2014-06-07: qty 30

## 2014-06-07 MED ORDER — ACETAMINOPHEN 650 MG RE SUPP: 650.0000 mg | Freq: Four times a day (QID) | RECTAL | Status: DC | PRN

## 2014-06-07 MED ORDER — LORAZEPAM 2 MG/ML IJ SOLN: 0.0000 mg | Freq: Two times a day (BID) | INTRAMUSCULAR | Status: DC

## 2014-06-07 MED ORDER — HEPARIN SODIUM (PORCINE) 5000 UNIT/ML IJ SOLN
5000.0000 [IU] | Freq: Three times a day (TID) | INTRAMUSCULAR | Status: DC
  Administered 2014-06-07: 5000 [IU] via SUBCUTANEOUS
  Filled 2014-06-07 (×4): qty 1

## 2014-06-07 MED ORDER — SODIUM CHLORIDE 0.9 % IV BOLUS (SEPSIS)
1000.0000 mL | Freq: Once | INTRAVENOUS | Status: AC
  Administered 2014-06-07: 1000 mL via INTRAVENOUS

## 2014-06-07 MED ORDER — ALUM & MAG HYDROXIDE-SIMETH 200-200-20 MG/5ML PO SUSP: 30.0000 mL | Freq: Four times a day (QID) | ORAL | Status: DC | PRN

## 2014-06-07 MED ORDER — ADULT MULTIVITAMIN W/MINERALS CH
1.0000 | ORAL_TABLET | Freq: Every day | ORAL | Status: DC
  Filled 2014-06-07: qty 1

## 2014-06-07 MED ORDER — HYDRALAZINE HCL 20 MG/ML IJ SOLN
5.0000 mg | INTRAMUSCULAR | Status: DC | PRN
Start: 2014-06-07 — End: 2014-06-07

## 2014-06-07 MED ORDER — VITAMIN B-1 100 MG PO TABS
100.0000 mg | ORAL_TABLET | Freq: Every day | ORAL | Status: DC
  Filled 2014-06-07: qty 1

## 2014-06-07 MED ORDER — FAMOTIDINE IN NACL 20-0.9 MG/50ML-% IV SOLN
20.0000 mg | Freq: Two times a day (BID) | INTRAVENOUS | Status: DC
  Filled 2014-06-07 (×2): qty 50

## 2014-06-07 MED ORDER — SODIUM CHLORIDE 0.9 % IV SOLN
INTRAVENOUS | Status: DC
  Administered 2014-06-07: 06:00:00 via INTRAVENOUS

## 2014-06-07 MED ORDER — ATORVASTATIN CALCIUM 80 MG PO TABS
80.0000 mg | ORAL_TABLET | Freq: Every day | ORAL | Status: DC
  Filled 2014-06-07: qty 1

## 2014-06-07 MED ORDER — ASPIRIN 81 MG PO CHEW
81.0000 mg | CHEWABLE_TABLET | Freq: Every day | ORAL | Status: DC
  Administered 2014-06-07: 81 mg via ORAL
  Filled 2014-06-07: qty 1

## 2014-06-07 MED ORDER — LORAZEPAM 2 MG/ML IJ SOLN: 0.0000 mg | Freq: Four times a day (QID) | INTRAMUSCULAR | Status: DC

## 2014-06-07 MED ORDER — QUETIAPINE FUMARATE 50 MG PO TABS
50.0000 mg | ORAL_TABLET | Freq: Every day | ORAL | Status: DC
  Filled 2014-06-07: qty 1

## 2014-06-07 MED ORDER — CLOPIDOGREL BISULFATE 75 MG PO TABS
75.0000 mg | ORAL_TABLET | Freq: Every day | ORAL | Status: DC
  Filled 2014-06-07: qty 1

## 2014-06-07 MED ORDER — LORAZEPAM 1 MG PO TABS: 1.0000 mg | ORAL_TABLET | Freq: Four times a day (QID) | ORAL | Status: DC | PRN

## 2014-06-07 MED ORDER — LEVETIRACETAM 500 MG PO TABS
1000.0000 mg | ORAL_TABLET | Freq: Two times a day (BID) | ORAL | Status: DC
  Filled 2014-06-07 (×2): qty 2

## 2014-06-07 NOTE — Progress Notes (Signed)
Patient ambulatory leaving hospital against medical advice.

## 2014-06-07 NOTE — H&P (Signed)
[facility] History and Physical  Hall Birchard [place]aula XFG:[id number] DOB: 04-20-1965 DOA: 06/06/2014  Referring physician: ED physician PCP: [facility]  Specialists:   Chief Complaint: Abdominal pain, possible chest pain  HPI: [employer] is a 49 y.o. male with PMH of hypertension, GERD, depression, schizophrenia, seizure, alcoholic pancreatitis, tobacco abuse, alcohol abuse, coronary artery disease, history of STEMI, posterior status of stent placement and CABG, who presents of with abdominal pain and possible chest pain.  Patient has schizophrenia and is a very poor historian. Initially he reported to ED physician that he has chest pain, time of onset is not clear. When I saw patient in the emergency room, he states that he has abdominal pain, which is located in the epigastric area, no chest pain. He does not have nausea and vomiting. He reports that he continues to smoke and drink alcohol.   Currently patient denies fever, chills, running nose, ear pain, headaches, cough, SOB, diarrhea, constipation, dysuria, urgency, frequency, hematuria, skin rashes or leg swelling. No unilateral weakness, numbness or tingling sensations. No vision change or hearing loss.  In ED, patient was found to have elevated lipase 177 (previous lipase was 72 on 01/23/14), INR1.09, PTT 34, troponin negative, BNP 28.5, electrolytes okay, negative chest x-ray for acute abnormalities, bradycardia. Patient is admitted to inpatient for further evaluation and treatment.  Where does patient live?   At home    Can patient participate in ADLs?   Little   Review of Systems:   General: no fevers, chills, no changes in body weight, has fatigue HEENT: no blurry vision, hearing changes or sore throat Pulm: no dyspnea, coughing, wheezing CV: had chest pain?, palpitations Abd: no nausea, vomiting, has abdominal pain, no diarrhea, constipation GU: no dysuria, burning on urination, increased urinary frequency, hematuria   Ext: no leg edema Neuro: no unilateral weakness, numbness, or tingling, no vision change or hearing loss Skin: no rash MSK: No muscle spasm, no deformity, no limitation of range of movement in spin Heme: No easy bruising.  Travel history: No recent long distant travel.  Allergy:  Allergies  Allergen Reactions  . Hctz [Hydrochlorothiazide] Other (See Comments)    Dizzy spells  . Hydroxyzine Hives  . Sulfonamide Derivatives Hives  . Cetirizine & Related Other (See Comments)    unknown    Past Medical History  Diagnosis Date  . Hypertension   . Asthma   . GERD (gastroesophageal reflux disease)   . Chronic pain   . Schizophrenia   . Alcohol abuse   . Seizures   . Pancreatitis   . Depression   . CAD (coronary artery disease)     Past Surgical History  Procedure Laterality Date  . Left heart catheterization with coronary angiogram N/A 07/06/2013    Procedure: LEFT HEART CATHETERIZATION WITH CORONARY ANGIOGRAM;  Surgeon: Clent Demark, MD;  Location: [facility] CATH LAB;  Service: Cardiovascular;  Laterality: N/A;  . Percutaneous coronary stent intervention (pci-s)  07/06/2013    Procedure: PERCUTANEOUS CORONARY STENT INTERVENTION (PCI-S);  Surgeon: Clent Demark, MD;  Location: [facility] CATH LAB;  Service: Cardiovascular;;    Social History:  reports that he has been smoking Cigarettes.  He has been smoking about 0.00 packs per day. He does not have any smokeless tobacco history on file. He reports that he drinks alcohol. He reports that he uses illicit drugs (Marijuana).  Family History:  Family History  Problem Relation Age of Onset  . Malignant hyperthermia Mother   . Cirrhosis  Father   . Alcohol abuse Father      Prior to Admission medications   Medication Sig Start Date End Date Taking? Authorizing Provider  traZODone (DESYREL) 100 MG tablet Take 1 tablet by mouth at bedtime as needed. 05/13/14  Yes Historical Provider, MD  albuterol (PROVENTIL HFA;VENTOLIN HFA) 108 (90 BASE)  MCG/ACT inhaler Inhale 2 puffs into the lungs every 4 (four) hours as needed for wheezing or shortness of breath. Patient not taking: Reported on 05/02/2014 02/13/14   Elmyra Ricks Pisciotta, PA-C  aspirin 81 MG chewable tablet Chew 1 tablet (81 mg total) by mouth daily. Patient not taking: Reported on 05/02/2014 11/23/13   Kristen N Forget, DO  atorvastatin (LIPITOR) 80 MG tablet Take 1 tablet (80 mg total) by mouth daily. Patient not taking: Reported on 05/02/2014 02/13/14   Elmyra Ricks Pisciotta, PA-C  clopidogrel (PLAVIX) 75 MG tablet Take 1 tablet (75 mg total) by mouth daily. Patient not taking: Reported on 05/02/2014 02/13/14   Elmyra Ricks Pisciotta, PA-C  divalproex (DEPAKOTE) 500 MG DR tablet Take 1 tablet (500 mg total) by mouth 2 (two) times daily. Patient not taking: Reported on 05/02/2014 02/13/14   Elmyra Ricks Pisciotta, PA-C  famotidine (PEPCID) 20 MG tablet Take 1 tablet (20 mg total) by mouth 2 (two) times daily. Patient not taking: Reported on 05/02/2014 02/13/14   Elmyra Ricks Pisciotta, PA-C  folic acid (FOLVITE) 1 MG tablet Place 1 tablet (1 mg total) into feeding tube daily. Patient not taking: Reported on 05/02/2014 07/23/13   Donita Brooks, NP  furosemide (LASIX) 40 MG tablet Take 1 tablet (40 mg total) by mouth daily. Patient not taking: Reported on 05/02/2014 02/13/14   Elmyra Ricks Pisciotta, PA-C  hydrALAZINE (APRESOLINE) 10 MG tablet Take 1 tablet (10 mg total) by mouth 2 (two) times daily. Patient not taking: Reported on 05/02/2014 02/13/14   Elmyra Ricks Pisciotta, PA-C  levETIRAcetam (KEPPRA) 1000 MG tablet Take 1 tablet (1,000 mg total) by mouth 2 (two) times daily. Patient not taking: Reported on 05/02/2014 02/13/14   Elmyra Ricks Pisciotta, PA-C  lisinopril (ZESTRIL) 2.5 MG tablet Take 1 tablet (2.5 mg total) by mouth daily. Patient not taking: Reported on 05/02/2014 02/13/14   Elmyra Ricks Pisciotta, PA-C  meloxicam (MOBIC) 15 MG tablet Take 1 tablet (15 mg total) by mouth daily. Patient not taking: Reported on 05/02/2014 11/13/13    Alvina Chou, PA-C  metoprolol tartrate (LOPRESSOR) 25 MG tablet Take 0.5 tablets (12.5 mg total) by mouth 2 (two) times daily. Patient not taking: Reported on 05/02/2014 02/13/14   Elmyra Ricks Pisciotta, PA-C  oxyCODONE (ROXICODONE) 5 MG immediate release tablet Take one tablet by mouth twice daily for pain Patient not taking: Reported on 05/02/2014 10/01/13   Tiffany L Reed, DO  QUEtiapine (SEROQUEL) 50 MG tablet Take 1 tablet (50 mg total) by mouth at bedtime. Patient not taking: Reported on 05/02/2014 02/13/14   Monico Blitz, PA-C    Physical Exam: Filed Vitals:   06/07/14 0209 06/07/14 0215 06/07/14 0230 06/07/14 0300  BP: 92/52 103/61 105/63 103/69  Pulse: 48 50 56 60  Temp:      TempSrc:      Resp: 19 24 24 18   SpO2: 96% 97% 96% 98%   General: Not in acute distress HEENT:       Eyes: PERRL, EOMI, no scleral icterus.       ENT: No discharge from the ears and nose, no pharynx injection, no tonsillar enlargement.        Neck: No JVD, no bruit, no mass  felt. Heme: No neck lymph node enlargement. Cardiac: S1/S2, RRR, No murmurs, No gallops or rubs. Pulm:  No rales, wheezing, rhonchi or rubs. Abd: Soft, nondistended, tenderness over epigastric area, no rebound pain, no organomegaly, BS present. Ext: No pitting leg edema bilaterally. 2+DP/PT pulse bilaterally. Musculoskeletal: No joint deformities, No joint redness or warmth, no limitation of ROM in spin. Skin: No rashes.  Neuro: drowsy, but oriented X3, cranial nerves II-XII grossly intact, muscle strength 5/5 in all extremities, sensation to light touch intact. Brachial reflex 1+ bilaterally. Knee reflex 1+ bilaterally. Negative Babinski's sign.  Psych: Patient is not psychotic, no suicidal or hemocidal ideation.  Labs on Admission:  Basic Metabolic Panel:  Recent Labs Lab 06/06/14 2206  NA 137  K 3.6  CL 101  CO2 26  GLUCOSE 97  BUN 7  CREATININE 0.74  CALCIUM 8.9   Liver Function Tests:  Recent Labs Lab  06/06/14 2313  AST 13*  ALT 10*  ALKPHOS 47  BILITOT 0.6  PROT 6.1*  ALBUMIN 3.3*    Recent Labs Lab 06/06/14 2313  LIPASE 177*   No results for input(s): AMMONIA in the last 168 hours. CBC:  Recent Labs Lab 06/06/14 2206  WBC 8.4  HGB 12.7*  HCT 36.9*  MCV 90.9  PLT 246   Cardiac Enzymes: No results for input(s): CKTOTAL, CKMB, CKMBINDEX, TROPONINI in the last 168 hours.  BNP (last 3 results)  Recent Labs  05/01/14 2153 06/06/14 2206  BNP 21.0 28.5    ProBNP (last 3 results)  Recent Labs  06/27/13 1933  PROBNP 146.2*    CBG: No results for input(s): GLUCAP in the last 168 hours.  Radiological Exams on Admission: Dg Chest 2 View  06/06/2014   CLINICAL DATA:  Central chest pain with shortness of breath and nausea since this evening.  EXAM: CHEST  2 VIEW  COMPARISON:  05/01/2014  FINDINGS: Lungs are hyperinflated, unchanged. The cardiomediastinal contours are normal. Pulmonary vasculature is normal. No consolidation, pleural effusion, or pneumothorax. No acute osseous abnormalities are seen.  IMPRESSION: No acute pulmonary process.   Electronically Signed   By: Jeb Levering M.D.   On: 06/06/2014 22:32   Dg Chest Portable 1 View  06/06/2014   CLINICAL DATA:  Chest pain and shortness of breath for 1 day.  EXAM: PORTABLE CHEST - 1 VIEW  COMPARISON:  Earlier this day at 2233 hour  FINDINGS: The cardiomediastinal contours are normal. Unchanged hyperinflation. Pulmonary vasculature is normal. No consolidation, pleural effusion, or pneumothorax. No acute osseous abnormalities are seen.  IMPRESSION: No change from prior exam.   Electronically Signed   By: Jeb Levering M.D.   On: 06/06/2014 23:26    EKG: Independently reviewed.  Abnormal findings: Mild ST depression only in lead 3.   Assessment/Plan Principal Problem:   Pancreatitis Active Problems:   HYPERCHOLESTEROLEMIA   PARANOID SCHIZOPHRENIA, CHRONIC   Alcohol abuse   Multiple substance abuse    Essential hypertension   ST elevation myocardial infarction (STEMI) of inferior wall, initial episode of care   Seizures   Tobacco abuse   CAD (coronary artery disease) of artery bypass graft   Chest pain   Protein calorie malnutrition  Alcoholic pancreatitis: Patient's abdominal pain is likely caused by recurrent pancreatitis due to alcohol abuse. Patient does not have SIRS on admission. Hemodynamically stable. He had CT-abd/pelvis on 01/12/14 which showed mild inflammatory change around the pancreas with mild dilatation of the pancreatic duct, and liver and gallbladder were normal.   -  will admit to tele bed -NPO for pancreatitis -IVF at 1oo cc/hr, plus 1L NS bolus -IV morphine for pain control, IV zofran for nausea -IV pepcide for esophagitis/GERD -abdominal ultrasound to re-evaluate pancreas, gallbladder, and bile ducts   Chest pain: this is questionable.  Initially he reported to ED physician that he has chest pain. When I saw patient in the emergency room, he states that he has no chest pain, only has abdominal pain. CXR negative. Troponin negative. -Trop x 3 -repeat EKG in AM  CAD (s/p of CABG and stent). He is on aspirin and Plavix at home. It seems that patient has not been compliant to medications. -Continue aspirin, Plavix, Lipitor, metoprolol  HLD: Last LDL was 55 on 01/17/14. -Continue home medications: Lipitor  Tobacco abuse and Alcohol abuse: -Did counseling about importance of quitting smoking -Nicotine patch -Did counseling about the importance of quitting drinking -CIWA protocol  Protein calorie malnutrition: -Ensure when able to eat, likely to start tomorrow  Seizures: Keppra and Depakote -Continue home medications -Given 1 dose of Keppra by IV today, 1000 mg x 1 -Keppra level  DVT ppx: SQ Heparin   Code Status: Full code Family Communication: None at bed side.     Disposition Plan: Admit to inpatient   Date of Service 06/07/2014    Ivor Costa [facility] Pager [phone]  If 7PM-7AM, please contact night-coverage [email or web address] Password [email or web address] 06/07/2014, 3:55 AM

## 2014-06-07 NOTE — Progress Notes (Signed)
Patient leaving AMA. Tried to discuss patient's condition with him and encourage patient to receive the care he needed prior to leaving. Patient not accepting. Dr. Conley Canal made aware. AMA papers signed and placed in chart.

## 2014-06-07 NOTE — ED Notes (Signed)
Niu, MD at bedside. °

## 2014-06-09 ENCOUNTER — Emergency Department (HOSPITAL_COMMUNITY): Payer: Medicare Other

## 2014-06-09 ENCOUNTER — Inpatient Hospital Stay (HOSPITAL_COMMUNITY)
Admission: EM | Admit: 2014-06-09 | Discharge: 2014-06-11 | DRG: 100 | Disposition: A | Discharge status: Home / Self Care | Payer: Medicare Other | Attending: Internal Medicine | Admitting: Internal Medicine

## 2014-06-09 ENCOUNTER — Encounter (HOSPITAL_COMMUNITY): Payer: Self-pay | Admitting: *Deleted

## 2014-06-09 DIAGNOSIS — K859 Acute pancreatitis without necrosis or infection, unspecified: Secondary | ICD-10-CM | POA: Diagnosis present

## 2014-06-09 DIAGNOSIS — F259 Schizoaffective disorder, unspecified: Secondary | ICD-10-CM | POA: Diagnosis present

## 2014-06-09 DIAGNOSIS — E780 Pure hypercholesterolemia: Secondary | ICD-10-CM | POA: Diagnosis present

## 2014-06-09 DIAGNOSIS — K219 Gastro-esophageal reflux disease without esophagitis: Secondary | ICD-10-CM | POA: Diagnosis present

## 2014-06-09 DIAGNOSIS — Z7902 Long term (current) use of antithrombotics/antiplatelets: Secondary | ICD-10-CM | POA: Diagnosis not present

## 2014-06-09 DIAGNOSIS — I229 Subsequent ST elevation (STEMI) myocardial infarction of unspecified site: Secondary | ICD-10-CM | POA: Diagnosis not present

## 2014-06-09 DIAGNOSIS — K852 Alcohol induced acute pancreatitis without necrosis or infection: Secondary | ICD-10-CM

## 2014-06-09 DIAGNOSIS — I252 Old myocardial infarction: Secondary | ICD-10-CM

## 2014-06-09 DIAGNOSIS — I10 Essential (primary) hypertension: Secondary | ICD-10-CM | POA: Diagnosis present

## 2014-06-09 DIAGNOSIS — F329 Major depressive disorder, single episode, unspecified: Secondary | ICD-10-CM | POA: Diagnosis present

## 2014-06-09 DIAGNOSIS — E871 Hypo-osmolality and hyponatremia: Secondary | ICD-10-CM | POA: Diagnosis present

## 2014-06-09 DIAGNOSIS — Z955 Presence of coronary angioplasty implant and graft: Secondary | ICD-10-CM

## 2014-06-09 DIAGNOSIS — J0100 Acute maxillary sinusitis, unspecified: Secondary | ICD-10-CM | POA: Diagnosis present

## 2014-06-09 DIAGNOSIS — G40909 Epilepsy, unspecified, not intractable, without status epilepticus: Principal | ICD-10-CM

## 2014-06-09 DIAGNOSIS — G894 Chronic pain syndrome: Secondary | ICD-10-CM | POA: Diagnosis present

## 2014-06-09 DIAGNOSIS — F209 Schizophrenia, unspecified: Secondary | ICD-10-CM | POA: Diagnosis present

## 2014-06-09 DIAGNOSIS — I214 Non-ST elevation (NSTEMI) myocardial infarction: Secondary | ICD-10-CM | POA: Diagnosis present

## 2014-06-09 DIAGNOSIS — J45909 Unspecified asthma, uncomplicated: Secondary | ICD-10-CM | POA: Diagnosis present

## 2014-06-09 DIAGNOSIS — I2510 Atherosclerotic heart disease of native coronary artery without angina pectoris: Secondary | ICD-10-CM | POA: Diagnosis present

## 2014-06-09 DIAGNOSIS — F1010 Alcohol abuse, uncomplicated: Secondary | ICD-10-CM | POA: Diagnosis not present

## 2014-06-09 DIAGNOSIS — Z8674 Personal history of sudden cardiac arrest: Secondary | ICD-10-CM

## 2014-06-09 DIAGNOSIS — F17210 Nicotine dependence, cigarettes, uncomplicated: Secondary | ICD-10-CM | POA: Diagnosis present

## 2014-06-09 DIAGNOSIS — Z7982 Long term (current) use of aspirin: Secondary | ICD-10-CM | POA: Diagnosis not present

## 2014-06-09 DIAGNOSIS — Z79899 Other long term (current) drug therapy: Secondary | ICD-10-CM | POA: Diagnosis not present

## 2014-06-09 DIAGNOSIS — R079 Chest pain, unspecified: Secondary | ICD-10-CM | POA: Diagnosis present

## 2014-06-09 DIAGNOSIS — E876 Hypokalemia: Secondary | ICD-10-CM | POA: Diagnosis present

## 2014-06-09 DIAGNOSIS — IMO0002 Reserved for concepts with insufficient information to code with codable children: Secondary | ICD-10-CM | POA: Diagnosis present

## 2014-06-09 DIAGNOSIS — Z720 Tobacco use: Secondary | ICD-10-CM | POA: Diagnosis present

## 2014-06-09 DIAGNOSIS — I25810 Atherosclerosis of coronary artery bypass graft(s) without angina pectoris: Secondary | ICD-10-CM | POA: Diagnosis present

## 2014-06-09 DIAGNOSIS — Z9119 Patient's noncompliance with other medical treatment and regimen: Secondary | ICD-10-CM | POA: Diagnosis present

## 2014-06-09 LAB — URINALYSIS, ROUTINE W REFLEX MICROSCOPIC
Bilirubin Urine: NEGATIVE
GLUCOSE, UA: NEGATIVE mg/dL
Hgb urine dipstick: NEGATIVE
KETONES UR: NEGATIVE mg/dL
Leukocytes, UA: NEGATIVE
NITRITE: NEGATIVE
Protein, ur: NEGATIVE mg/dL
SPECIFIC GRAVITY, URINE: 1.01 (ref 1.005–1.030)
Urobilinogen, UA: 0.2 mg/dL (ref 0.0–1.0)
pH: 7 (ref 5.0–8.0)

## 2014-06-09 LAB — ETHANOL

## 2014-06-09 LAB — CBC WITH DIFFERENTIAL/PLATELET
BASOS PCT: 0 % (ref 0–1)
Basophils Absolute: 0 10*3/uL (ref 0.0–0.1)
Eosinophils Absolute: 0.1 10*3/uL (ref 0.0–0.7)
Eosinophils Relative: 1 % (ref 0–5)
HEMATOCRIT: 34.8 % — AB (ref 39.0–52.0)
Hemoglobin: 12 g/dL — ABNORMAL LOW (ref 13.0–17.0)
LYMPHS PCT: 15 % (ref 12–46)
Lymphs Abs: 1.1 10*3/uL (ref 0.7–4.0)
MCH: 31.4 pg (ref 26.0–34.0)
MCHC: 34.5 g/dL (ref 30.0–36.0)
MCV: 91.1 fL (ref 78.0–100.0)
Monocytes Absolute: 0.6 10*3/uL (ref 0.1–1.0)
Monocytes Relative: 9 % (ref 3–12)
Neutro Abs: 5.6 10*3/uL (ref 1.7–7.7)
Neutrophils Relative %: 75 % (ref 43–77)
PLATELETS: 237 10*3/uL (ref 150–400)
RBC: 3.82 MIL/uL — AB (ref 4.22–5.81)
RDW: 13.5 % (ref 11.5–15.5)
WBC: 7.4 10*3/uL (ref 4.0–10.5)

## 2014-06-09 LAB — COMPREHENSIVE METABOLIC PANEL
ALBUMIN: 3.4 g/dL — AB (ref 3.5–5.0)
ALK PHOS: 48 U/L (ref 38–126)
ALT: 12 U/L — ABNORMAL LOW (ref 17–63)
AST: 15 U/L (ref 15–41)
Anion gap: 10 (ref 5–15)
BUN: 6 mg/dL (ref 6–20)
CALCIUM: 8.5 mg/dL — AB (ref 8.9–10.3)
CO2: 25 mmol/L (ref 22–32)
Chloride: 95 mmol/L — ABNORMAL LOW (ref 101–111)
Creatinine, Ser: 0.57 mg/dL — ABNORMAL LOW (ref 0.61–1.24)
GFR calc Af Amer: >60 mL/min (ref >60)
GFR calc non Af Amer: >60 mL/min (ref >60)
Glucose, Bld: 149 mg/dL — ABNORMAL HIGH (ref 65–99)
Potassium: 3.3 mmol/L — ABNORMAL LOW (ref 3.5–5.1)
Sodium: 130 mmol/L — ABNORMAL LOW (ref 135–145)
TOTAL PROTEIN: 6.5 g/dL (ref 6.5–8.1)
Total Bilirubin: 0.3 mg/dL (ref 0.3–1.2)

## 2014-06-09 LAB — RAPID URINE DRUG SCREEN, HOSP PERFORMED
AMPHETAMINES: NOT DETECTED
BARBITURATES: NOT DETECTED
Benzodiazepines: NOT DETECTED
COCAINE: NOT DETECTED
Opiates: NOT DETECTED
Tetrahydrocannabinol: POSITIVE — AB

## 2014-06-09 LAB — CK: Total CK: 105 U/L (ref 49–397)

## 2014-06-09 LAB — LIPASE, BLOOD: LIPASE: 257 U/L — AB (ref 22–51)

## 2014-06-09 LAB — AMMONIA: AMMONIA: 19 umol/L (ref 9–35)

## 2014-06-09 LAB — TROPONIN I: Troponin I: <0.03 ng/mL (ref <0.031)

## 2014-06-09 LAB — VALPROIC ACID LEVEL: VALPROIC ACID LVL: 12 ug/mL — AB (ref 50.0–100.0)

## 2014-06-09 MED ORDER — CLOPIDOGREL BISULFATE 75 MG PO TABS
75.0000 mg | ORAL_TABLET | Freq: Every day | ORAL | Status: DC
  Administered 2014-06-10 – 2014-06-11 (×2): 75 mg via ORAL
  Filled 2014-06-09 (×2): qty 1

## 2014-06-09 MED ORDER — BENZTROPINE MESYLATE 0.5 MG PO TABS
0.5000 mg | ORAL_TABLET | Freq: Two times a day (BID) | ORAL | Status: DC
  Administered 2014-06-09 – 2014-06-11 (×4): 0.5 mg via ORAL
  Filled 2014-06-09 (×6): qty 1

## 2014-06-09 MED ORDER — FOLIC ACID 1 MG PO TABS: 1.0000 mg | ORAL_TABLET | Freq: Every day | ORAL | Status: DC

## 2014-06-09 MED ORDER — ALBUTEROL SULFATE HFA 108 (90 BASE) MCG/ACT IN AERS: 2.0000 | INHALATION_SPRAY | RESPIRATORY_TRACT | Status: DC | PRN

## 2014-06-09 MED ORDER — SODIUM CHLORIDE 0.9 % IV SOLN
INTRAVENOUS | Status: DC
  Administered 2014-06-09: via INTRAVENOUS

## 2014-06-09 MED ORDER — VITAMIN B-1 100 MG PO TABS
100.0000 mg | ORAL_TABLET | Freq: Every day | ORAL | Status: DC
  Administered 2014-06-09 – 2014-06-11 (×3): 100 mg via ORAL
  Filled 2014-06-09 (×3): qty 1

## 2014-06-09 MED ORDER — SODIUM CHLORIDE 0.9 % IV SOLN
INTRAVENOUS | Status: AC
  Administered 2014-06-09: 20:00:00 via INTRAVENOUS

## 2014-06-09 MED ORDER — FLUTICASONE PROPIONATE 50 MCG/ACT NA SUSP
2.0000 | Freq: Every day | NASAL | Status: DC
  Administered 2014-06-11: 2 via NASAL
  Filled 2014-06-09 (×2): qty 16

## 2014-06-09 MED ORDER — IPRATROPIUM BROMIDE 0.02 % IN SOLN: 0.5000 mg | Freq: Four times a day (QID) | RESPIRATORY_TRACT | Status: DC

## 2014-06-09 MED ORDER — PANTOPRAZOLE SODIUM 40 MG IV SOLR
40.0000 mg | Freq: Once | INTRAVENOUS | Status: AC
  Administered 2014-06-09: 40 mg via INTRAVENOUS
  Filled 2014-06-09: qty 40

## 2014-06-09 MED ORDER — SODIUM CHLORIDE 0.9 % IV SOLN
1000.0000 mg | Freq: Once | INTRAVENOUS | Status: AC
  Administered 2014-06-09: 1000 mg via INTRAVENOUS
  Filled 2014-06-09: qty 10

## 2014-06-09 MED ORDER — ATORVASTATIN CALCIUM 80 MG PO TABS
80.0000 mg | ORAL_TABLET | Freq: Every day | ORAL | Status: DC
  Administered 2014-06-09 – 2014-06-10 (×2): 80 mg via ORAL
  Filled 2014-06-09 (×4): qty 1

## 2014-06-09 MED ORDER — LORAZEPAM 2 MG/ML IJ SOLN
0.0000 mg | Freq: Four times a day (QID) | INTRAMUSCULAR | Status: DC
  Administered 2014-06-09: 1 mg via INTRAVENOUS
  Administered 2014-06-10: 2 mg via INTRAVENOUS
  Filled 2014-06-09 (×3): qty 1

## 2014-06-09 MED ORDER — QUETIAPINE FUMARATE 50 MG PO TABS
50.0000 mg | ORAL_TABLET | Freq: Every day | ORAL | Status: DC
  Administered 2014-06-09 – 2014-06-10 (×2): 50 mg via ORAL
  Filled 2014-06-09 (×4): qty 1

## 2014-06-09 MED ORDER — ACETAMINOPHEN 325 MG PO TABS: 650.0000 mg | ORAL_TABLET | Freq: Four times a day (QID) | ORAL | Status: DC | PRN

## 2014-06-09 MED ORDER — ACETAMINOPHEN 650 MG RE SUPP: 650.0000 mg | Freq: Four times a day (QID) | RECTAL | Status: DC | PRN

## 2014-06-09 MED ORDER — LORAZEPAM 2 MG/ML IJ SOLN
0.0000 mg | Freq: Two times a day (BID) | INTRAMUSCULAR | Status: DC
Start: 2014-06-11 — End: 2014-06-11

## 2014-06-09 MED ORDER — TRAZODONE HCL 100 MG PO TABS
100.0000 mg | ORAL_TABLET | Freq: Every evening | ORAL | Status: DC | PRN
  Filled 2014-06-09: qty 1

## 2014-06-09 MED ORDER — LORAZEPAM 1 MG PO TABS
1.0000 mg | ORAL_TABLET | Freq: Four times a day (QID) | ORAL | Status: DC | PRN
  Administered 2014-06-10: 1 mg via ORAL
  Filled 2014-06-09: qty 1

## 2014-06-09 MED ORDER — SODIUM CHLORIDE 0.9 % IV BOLUS (SEPSIS)
1000.0000 mL | Freq: Once | INTRAVENOUS | Status: AC
  Administered 2014-06-09: 1000 mL via INTRAVENOUS

## 2014-06-09 MED ORDER — ALBUTEROL SULFATE (2.5 MG/3ML) 0.083% IN NEBU: 2.5000 mg | INHALATION_SOLUTION | RESPIRATORY_TRACT | Status: DC | PRN

## 2014-06-09 MED ORDER — ONDANSETRON HCL 4 MG/2ML IJ SOLN: 4.0000 mg | Freq: Four times a day (QID) | INTRAMUSCULAR | Status: DC | PRN

## 2014-06-09 MED ORDER — AMOXICILLIN-POT CLAVULANATE 875-125 MG PO TABS
1.0000 | ORAL_TABLET | Freq: Two times a day (BID) | ORAL | Status: DC
Start: 2014-06-10 — End: 2014-06-11
  Administered 2014-06-09 – 2014-06-11 (×4): 1 via ORAL
  Filled 2014-06-09 (×6): qty 1

## 2014-06-09 MED ORDER — ADULT MULTIVITAMIN W/MINERALS CH
1.0000 | ORAL_TABLET | Freq: Every day | ORAL | Status: DC
  Administered 2014-06-09 – 2014-06-11 (×3): 1 via ORAL
  Filled 2014-06-09 (×3): qty 1

## 2014-06-09 MED ORDER — SODIUM CHLORIDE 0.9 % IJ SOLN
3.0000 mL | Freq: Two times a day (BID) | INTRAMUSCULAR | Status: DC
  Administered 2014-06-09 – 2014-06-11 (×2): 3 mL via INTRAVENOUS

## 2014-06-09 MED ORDER — ASPIRIN 81 MG PO CHEW
81.0000 mg | CHEWABLE_TABLET | Freq: Every day | ORAL | Status: DC
  Administered 2014-06-10 – 2014-06-11 (×2): 81 mg via ORAL
  Filled 2014-06-09 (×2): qty 1

## 2014-06-09 MED ORDER — FOLIC ACID 1 MG PO TABS
1.0000 mg | ORAL_TABLET | Freq: Every day | ORAL | Status: DC
  Administered 2014-06-09 – 2014-06-11 (×3): 1 mg via ORAL
  Filled 2014-06-09 (×3): qty 1

## 2014-06-09 MED ORDER — THIAMINE HCL 100 MG/ML IJ SOLN
100.0000 mg | Freq: Every day | INTRAMUSCULAR | Status: DC
  Filled 2014-06-09 (×2): qty 1

## 2014-06-09 MED ORDER — ONDANSETRON HCL 4 MG PO TABS: 4.0000 mg | ORAL_TABLET | Freq: Four times a day (QID) | ORAL | Status: DC | PRN

## 2014-06-09 MED ORDER — GUAIFENESIN ER 600 MG PO TB12
600.0000 mg | ORAL_TABLET | Freq: Two times a day (BID) | ORAL | Status: DC
  Administered 2014-06-09 – 2014-06-11 (×4): 600 mg via ORAL
  Filled 2014-06-09 (×6): qty 1

## 2014-06-09 MED ORDER — DIVALPROEX SODIUM 500 MG PO DR TAB
500.0000 mg | DELAYED_RELEASE_TABLET | Freq: Two times a day (BID) | ORAL | Status: DC
  Administered 2014-06-09 – 2014-06-11 (×4): 500 mg via ORAL
  Filled 2014-06-09 (×6): qty 1

## 2014-06-09 MED ORDER — FAMOTIDINE 20 MG PO TABS
20.0000 mg | ORAL_TABLET | Freq: Two times a day (BID) | ORAL | Status: DC
  Administered 2014-06-09: 20 mg via ORAL
  Filled 2014-06-09 (×3): qty 1

## 2014-06-09 MED ORDER — HYDROCODONE-ACETAMINOPHEN 5-325 MG PO TABS
1.0000 | ORAL_TABLET | ORAL | Status: DC | PRN
  Administered 2014-06-10: 1 via ORAL
  Filled 2014-06-09: qty 1
  Filled 2014-06-09: qty 2

## 2014-06-09 MED ORDER — METOPROLOL TARTRATE 25 MG PO TABS
25.0000 mg | ORAL_TABLET | Freq: Every day | ORAL | Status: DC
  Filled 2014-06-09: qty 1

## 2014-06-09 MED ORDER — LEVETIRACETAM 500 MG PO TABS
1000.0000 mg | ORAL_TABLET | Freq: Two times a day (BID) | ORAL | Status: DC
  Administered 2014-06-09 – 2014-06-10 (×2): 1000 mg via ORAL
  Filled 2014-06-09 (×3): qty 2

## 2014-06-09 MED ORDER — LORAZEPAM 0.5 MG PO TABS
0.5000 mg | ORAL_TABLET | Freq: Every evening | ORAL | Status: DC | PRN
  Filled 2014-06-09: qty 1

## 2014-06-09 MED ORDER — LORAZEPAM 2 MG/ML IJ SOLN
1.0000 mg | Freq: Four times a day (QID) | INTRAMUSCULAR | Status: DC | PRN
  Administered 2014-06-11: 1 mg via INTRAVENOUS

## 2014-06-09 NOTE — Progress Notes (Signed)
Patient transported by [facility] to 3e14. Patient placed on tele box 3E40-25. Current HR is 66 and NSR.  No complaints of pain. Vital signs stable.  Patient very hungry and very thirsty.  Given a [place] sandwich and 2 vanilla ensure's. Patient was hard to arouse at one time.  RR came to assess.  Patient was able to wake up and say where he was at.  Will continue to monitor.

## 2014-06-09 NOTE — ED Notes (Signed)
Pt arrived via EMS, remains post ictal.

## 2014-06-09 NOTE — ED Notes (Addendum)
Unwitnessed seizure at home.  Found post-ictal by EMS. Has history of seizures, has meds, unsure if meds are being taken. Patient attends to name but not speaking. Pupils are enlarged, sluggishly reactive to light. Patient has begun to answer yes/ no questions. States head hurts as usual after seizure.

## 2014-06-09 NOTE — H&P (Signed)
PCP: [facility]    Referring provider Rancour   Chief Complaint: Seizures  HPI: Chase Blackwell is a 49 y.o. male   has a past medical history of Hypertension; Asthma; GERD (gastroesophageal reflux disease); Chronic pain; Schizophrenia; Alcohol abuse; Seizures; Pancreatitis; Depression; and CAD (coronary artery disease).   Patient is a poor historian likely secondary to history of schizophrenia. Apparently he was recently admitted for chest pain evaluation but left eating main. Today patient was brought by EMS due to seizure activity. Seizure activity stopped spontaneously. Patient has history of seizures supposed to be on Keppra and Depakote states not sure he is taking it. Has history of alcohol abuse but again patient is unclear when was his last alcohol intake. Alcohol level is undetectable. R date on CIWA protocol in the emergency department CT scan of the head showing no evidence of intracranial bleeding but atrophy noted.  Patient doses history of coronary artery disease status post stenting in 2015. He was supposed to be on Plavix but unsure if he is actually being compliant.. Patient he lives together with [name] who is his brother phone number [phone]  Hospitalist was called for admission for seizure disorder   Review of Systems: Unable to obtain as patient is not answering questions Past Medical History: Past Medical History  Diagnosis Date  . Hypertension   . Asthma   . GERD (gastroesophageal reflux disease)   . Chronic pain   . Schizophrenia   . Alcohol abuse   . Seizures   . Pancreatitis   . Depression   . CAD (coronary artery disease)    Past Surgical History  Procedure Laterality Date  . Left heart catheterization with coronary angiogram N/A 07/06/2013    Procedure: LEFT HEART CATHETERIZATION WITH CORONARY ANGIOGRAM;  Surgeon: Clent Demark, MD;  Location: [facility] CATH LAB;  Service: Cardiovascular;  Laterality: N/A;  . Percutaneous coronary stent  intervention (pci-s)  07/06/2013    Procedure: PERCUTANEOUS CORONARY STENT INTERVENTION (PCI-S);  Surgeon: Clent Demark, MD;  Location: [facility] CATH LAB;  Service: Cardiovascular;;     Medications: Prior to Admission medications   Medication Sig Start Date End Date Taking? Authorizing Provider  albuterol (PROVENTIL HFA;VENTOLIN HFA) 108 (90 BASE) MCG/ACT inhaler Inhale 2 puffs into the lungs every 4 (four) hours as needed for wheezing or shortness of breath. 02/13/14   Nicole Pisciotta, PA-C  aspirin 81 MG chewable tablet Chew 1 tablet (81 mg total) by mouth daily. 11/23/13   Kristen N Thackeray, DO  atorvastatin (LIPITOR) 80 MG tablet Take 1 tablet (80 mg total) by mouth daily. Patient taking differently: Take 80 mg by mouth at bedtime.  02/13/14   Nicole Pisciotta, PA-C  benztropine (COGENTIN) 0.5 MG tablet Take 0.5 mg by mouth 2 (two) times daily.    Historical Provider, MD  clopidogrel (PLAVIX) 75 MG tablet Take 1 tablet (75 mg total) by mouth daily. 02/13/14   Nicole Pisciotta, PA-C  divalproex (DEPAKOTE) 500 MG DR tablet Take 1 tablet (500 mg total) by mouth 2 (two) times daily. 02/13/14   Nicole Pisciotta, PA-C  famotidine (PEPCID) 20 MG tablet Take 1 tablet (20 mg total) by mouth 2 (two) times daily. Patient not taking: Reported on 05/02/2014 02/13/14   Elmyra Ricks Pisciotta, PA-C  folic acid (FOLVITE) 1 MG tablet Place 1 tablet (1 mg total) into feeding tube daily. Patient not taking: Reported on 05/02/2014 07/23/13   Donita Brooks, NP  furosemide (LASIX) 40 MG tablet Take 1 tablet (40  mg total) by mouth daily. 02/13/14   Nicole Pisciotta, PA-C  hydrALAZINE (APRESOLINE) 10 MG tablet Take 1 tablet (10 mg total) by mouth 2 (two) times daily. 02/13/14   Nicole Pisciotta, PA-C  levETIRAcetam (KEPPRA) 1000 MG tablet Take 1 tablet (1,000 mg total) by mouth 2 (two) times daily. 02/13/14   Nicole Pisciotta, PA-C  lisinopril (ZESTRIL) 2.5 MG tablet Take 1 tablet (2.5 mg total) by mouth daily. 02/13/14   Nicole  Pisciotta, PA-C  LORazepam (ATIVAN) 0.5 MG tablet Take 0.5 mg by mouth at bedtime as needed for anxiety.    Historical Provider, MD  meloxicam (MOBIC) 15 MG tablet Take 1 tablet (15 mg total) by mouth daily. 11/13/13   Kaitlyn Szekalski, PA-C  metoprolol tartrate (LOPRESSOR) 25 MG tablet Take 0.5 tablets (12.5 mg total) by mouth 2 (two) times daily. Patient taking differently: Take 25 mg by mouth daily.  02/13/14   Nicole Pisciotta, PA-C  oxyCODONE (ROXICODONE) 5 MG immediate release tablet Take one tablet by mouth twice daily for pain Patient taking differently: Take 5 mg by mouth daily as needed for severe pain. Take one tablet by mouth twice daily for pain 10/01/13   Tiffany L Reed, DO  QUEtiapine (SEROQUEL) 50 MG tablet Take 1 tablet (50 mg total) by mouth at bedtime. 02/13/14   Nicole Pisciotta, PA-C  traZODone (DESYREL) 100 MG tablet Take 1 tablet by mouth at bedtime as needed. 05/13/14   Historical Provider, MD    Allergies:   Allergies  Allergen Reactions  . Hctz [Hydrochlorothiazide] Other (See Comments)    Dizzy spells  . Hydroxyzine Hives  . Sulfonamide Derivatives Hives  . Cetirizine & Related Other (See Comments)    unknown    Social History:  Ambulatory    independently   Lives at home  With family     reports that he has been smoking Cigarettes.  He has been smoking about 0.00 packs per day. He does not have any smokeless tobacco history on file. He reports that he drinks alcohol. He reports that he uses illicit drugs (Marijuana).    Family History: family history includes Alcohol abuse in his father; Cirrhosis in his father; Malignant hyperthermia in his mother.    Physical Exam: Patient Vitals for the past 24 hrs:  BP Temp Temp src Pulse Resp SpO2  06/09/14 1950 113/94 mmHg - - 75 24 94 %  06/09/14 1940 118/70 mmHg - - 81 - -  06/09/14 1918 118/70 mmHg - - 81 24 95 %  06/09/14 1830 124/67 mmHg - - 77 12 98 %  06/09/14 1803 110/75 mmHg - - 81 20 99 %  06/09/14  1730 114/63 mmHg - - 78 25 94 %  06/09/14 1712 - 99.2 F (37.3 C) Rectal - - -  06/09/14 1655 112/70 mmHg 98 F (36.7 C) Oral 104 14 97 %    1. General:  in No Acute distress disheveled 2. Psychological: Alert ad Oriented 3. Head/ENT:   Dry Mucous Membranes                          Head Non traumatic, neck supple                           Poor Dentition 4. SKIN:   decreased Skin turgor,  Skin clean Dry and intact no rash 5. Heart: Regular rate and rhythm no Murmur, Rub or gallop 6. Lungs:  Clear to auscultation bilaterally, no wheezes or crackles   7. Abdomen: Soft, non-tender, Non distended 8. Lower extremities: no clubbing, cyanosis, or edema 9. Neurologically Grossly intact, moving all 4 extremities equally none tremulous 10. MSK: Normal range of motion  body mass index is unknown because there is no weight on file.   Labs on Admission:   Results for orders placed or performed during the hospital encounter of 06/09/14 (from the past 24 hour(s))  CBC with Differential/Platelet     Status: Abnormal   Collection Time: 06/09/14  5:05 PM  Result Value Ref Range   WBC 7.4 4.0 - 10.5 K/uL   RBC 3.82 (L) 4.22 - 5.81 MIL/uL   Hemoglobin 12.0 (L) 13.0 - 17.0 g/dL   HCT 34.8 (L) 39.0 - 52.0 %   MCV 91.1 78.0 - 100.0 fL   MCH 31.4 26.0 - 34.0 pg   MCHC 34.5 30.0 - 36.0 g/dL   RDW 13.5 11.5 - 15.5 %   Platelets 237 150 - 400 K/uL   Neutrophils Relative % 75 43 - 77 %   Neutro Abs 5.6 1.7 - 7.7 K/uL   Lymphocytes Relative 15 12 - 46 %   Lymphs Abs 1.1 0.7 - 4.0 K/uL   Monocytes Relative 9 3 - 12 %   Monocytes Absolute 0.6 0.1 - 1.0 K/uL   Eosinophils Relative 1 0 - 5 %   Eosinophils Absolute 0.1 0.0 - 0.7 K/uL   Basophils Relative 0 0 - 1 %   Basophils Absolute 0.0 0.0 - 0.1 K/uL  Comprehensive metabolic panel     Status: Abnormal   Collection Time: 06/09/14  5:05 PM  Result Value Ref Range   Sodium 130 (L) 135 - 145 mmol/L   Potassium 3.3 (L) 3.5 - 5.1 mmol/L   Chloride 95  (L) 101 - 111 mmol/L   CO2 25 22 - 32 mmol/L   Glucose, Bld 149 (H) 65 - 99 mg/dL   BUN 6 6 - 20 mg/dL   Creatinine, Ser 0.57 (L) 0.61 - 1.24 mg/dL   Calcium 8.5 (L) 8.9 - 10.3 mg/dL   Total Protein 6.5 6.5 - 8.1 g/dL   Albumin 3.4 (L) 3.5 - 5.0 g/dL   AST 15 15 - 41 U/L   ALT 12 (L) 17 - 63 U/L   Alkaline Phosphatase 48 38 - 126 U/L   Total Bilirubin 0.3 0.3 - 1.2 mg/dL   GFR calc non Af Amer >60 >60 mL/min   GFR calc Af Amer >60 >60 mL/min   Anion gap 10 5 - 15  Troponin I     Status: None   Collection Time: 06/09/14  5:05 PM  Result Value Ref Range   Troponin I <0.03 <0.031 ng/mL  Ethanol     Status: None   Collection Time: 06/09/14  5:05 PM  Result Value Ref Range   Alcohol, Ethyl (B) <5 <5 mg/dL  Valproic acid level     Status: Abnormal   Collection Time: 06/09/14  5:05 PM  Result Value Ref Range   Valproic Acid Lvl 12 (L) 50.0 - 100.0 ug/mL  Lipase, blood     Status: Abnormal   Collection Time: 06/09/14  5:05 PM  Result Value Ref Range   Lipase 257 (H) 22 - 51 U/L  Ammonia     Status: None   Collection Time: 06/09/14  5:05 PM  Result Value Ref Range   Ammonia 19 9 - 35 umol/L  CK  Status: None   Collection Time: 06/09/14  5:05 PM  Result Value Ref Range   Total CK 105 49 - 397 U/L  Urine rapid drug screen (hosp performed)not at [place]     Status: Abnormal   Collection Time: 06/09/14  6:33 PM  Result Value Ref Range   Opiates NONE DETECTED NONE DETECTED   Cocaine NONE DETECTED NONE DETECTED   Benzodiazepines NONE DETECTED NONE DETECTED   Amphetamines NONE DETECTED NONE DETECTED   Tetrahydrocannabinol POSITIVE (A) NONE DETECTED   Barbiturates NONE DETECTED NONE DETECTED  Urinalysis, Routine w reflex microscopic (not at [facility])     Status: Abnormal   Collection Time: 06/09/14  6:33 PM  Result Value Ref Range   Color, Urine STRAW (A) YELLOW   APPearance CLEAR CLEAR   Specific Gravity, Urine 1.010 1.005 - 1.030   pH 7.0 5.0 - 8.0   Glucose, UA NEGATIVE NEGATIVE  mg/dL   Hgb urine dipstick NEGATIVE NEGATIVE   Bilirubin Urine NEGATIVE NEGATIVE   Ketones, ur NEGATIVE NEGATIVE mg/dL   Protein, ur NEGATIVE NEGATIVE mg/dL   Urobilinogen, UA 0.2 0.0 - 1.0 mg/dL   Nitrite NEGATIVE NEGATIVE   Leukocytes, UA NEGATIVE NEGATIVE   UA No Evidence of UTI   No results found for: HGBA1C  Estimated Creatinine Clearance: 101.6 mL/min (by C-G formula based on Cr of 0.57).  BNP (last 3 results)  Recent Labs  06/27/13 1933  PROBNP 146.2*    Other results:  I have pearsonaly reviewed this: ECG REPORT  Rate: 90  Rhythm: Sinus rhythm  ST&T Change: no ischemic changes    There were no vitals filed for this visit.   Cultures:    Component Value Date/Time   SDES TRACHEAL ASPIRATE 07/23/2013 0309   SPECREQUEST NONE 07/23/2013 0309   CULT  07/23/2013 0309    MODERATE PSEUDOMONAS AERUGINOSA MODERATE KLEBSIELLA PNEUMONIAE Performed at [place] 07/26/2013 FINAL 07/23/2013 0309     Radiological Exams on Admission: Dg Chest 2 View  06/09/2014   CLINICAL DATA:  Seizure today, history asthma, hypertension, smoking, GERD, alcohol abuse, schizophrenia, pancreatitis, coronary artery disease  EXAM: CHEST  2 VIEW  COMPARISON:  06/06/2014  FINDINGS: Normal heart size, mediastinal contours, and pulmonary vascularity.  Lungs hyperinflated but clear.  No pleural effusion or pneumothorax.  Bones demineralized.  IMPRESSION: Hyperinflation without acute infiltrate.   Electronically Signed   By: Lavonia Dana M.D.   On: 06/09/2014 18:01   Ct Head Wo Contrast  06/09/2014   CLINICAL DATA:  Unwitnessed seizure at home. Found postictal. History of seizures.  EXAM: CT HEAD WITHOUT CONTRAST  TECHNIQUE: Contiguous axial images were obtained from the base of the skull through the vertex without intravenous contrast.  COMPARISON:  04/11/2014.  FINDINGS: Premature for age cerebral and cerebellar atrophy. This affects the temporal lobes particularly. No visible  acute stroke, hemorrhage, intra-axial mass lesion, inflammatory process, or extra-axial fluid. Calvarium intact. LEFT maxillary sinus acute air-fluid level with foamy secretions. Negative orbits. No mastoid fluid. Similar appearance to priors.  IMPRESSION: Premature atrophy. No acute or focal intracranial abnormality. No interval change most recent priors.  Acute LEFT maxillary sinusitis.   Electronically Signed   By: Rolla Flatten M.D.   On: 06/09/2014 18:07    Chart has been reviewed  Family not  at  Bedside    Assessment 49 year old gentleman with history of schizophrenia, coronary artery disease alcohol abuse and known seizure disorder with medical noncompliance presents with seizure most likely  secondary to medical noncompliance  Present on Admission:  Seizure most likely secondary to medical noncompliance will restart Keppra and Depakote . Essential hypertension - even somewhat soft blood pressure will hold the cerebral continue metoprolol  . Hyponatremia the setting of dehydration will give IV fluids  . Schizophrenia continue home medications  . Chest pain patient had reports of recent chest pain unsure have any chest pain currently. Has known history of coronary disease will cycle cardiac enzymes monitor on telemetry  . Pancreatitis elevated lipase  - will order CT scan of abdomen to confirm. Rest imaging was in January 2016 showing pancreatitis back then, showed there is no development of pseudocyst and/or chronic pancreatitis  . CAD (coronary artery disease) -  continue Plavix aspirin  . Tobacco abuse - discussed importance of quitting  . Alcohol abuse - CIwa protocol    Prophylaxis: SCD  CODE STATUS:  FULL CODE   Disposition:   likely will need placement for rehabilitation given medical noncompliance and inability to care for self would be helped with  assisted living   Other plan as per orders.  I have spent a total of 55 min on this admission  Zymere Patlan 06/09/2014,  7:59 PM  [facility]  Pager [phone]   after 2 AM please page floor coverage PA If 7AM-7PM, please contact the day team taking care of the patient  [email or web address]  Password [email or web address]

## 2014-06-09 NOTE — ED Provider Notes (Signed)
CSN: [id number]     Arrival date & time 06/09/14  1644 History   First MD Initiated Contact with Patient 06/09/14 1654     Chief Complaint  Patient presents with  . Seizures     (Consider location/radiation/quality/duration/timing/severity/associated sxs/prior Treatment) HPI Comments: Level V caveat for altered mental status. Patient brought in by ambulance after apparent seizure activity at home. Brother called EMS. Patient has history of seizures uncertain if he's been taking them. Patient is now postictal. Pupils dilated bilaterally. He left the hospital AMA 2 days ago after admission for chest pain. He is prescribed Keppra and depakote. Patient unable to give a history. He is answering some yes and no questions. There is no evidence of trauma.  The history is provided by the patient and the EMS personnel. The history is limited by the condition of the patient.    Past Medical History  Diagnosis Date  . Hypertension   . Asthma   . GERD (gastroesophageal reflux disease)   . Chronic pain   . Schizophrenia   . Alcohol abuse   . Seizures   . Pancreatitis   . Depression   . CAD (coronary artery disease)    Past Surgical History  Procedure Laterality Date  . Left heart catheterization with coronary angiogram N/A 07/06/2013    Procedure: LEFT HEART CATHETERIZATION WITH CORONARY ANGIOGRAM;  Surgeon: Clent Demark, MD;  Location: [facility] CATH LAB;  Service: Cardiovascular;  Laterality: N/A;  . Percutaneous coronary stent intervention (pci-s)  07/06/2013    Procedure: PERCUTANEOUS CORONARY STENT INTERVENTION (PCI-S);  Surgeon: Clent Demark, MD;  Location: [facility] CATH LAB;  Service: Cardiovascular;;   Family History  Problem Relation Age of Onset  . Malignant hyperthermia Mother   . Cirrhosis Father   . Alcohol abuse Father    History  Substance Use Topics  . Smoking status: Current Every Day Smoker -- 0.00 packs/day    Types: Cigarettes  . Smokeless tobacco: Not on file  . Alcohol Use:  Yes     Comment: daily heavy drinker    Review of Systems  Unable to perform ROS: Mental status change  Neurological: Positive for seizures.      Allergies  Hctz; Hydroxyzine; Sulfonamide derivatives; and Cetirizine & related  Home Medications   Prior to Admission medications   Medication Sig Start Date End Date Taking? Authorizing Provider  albuterol (PROVENTIL HFA;VENTOLIN HFA) 108 (90 BASE) MCG/ACT inhaler Inhale 2 puffs into the lungs every 4 (four) hours as needed for wheezing or shortness of breath. 02/13/14   Nicole Pisciotta, PA-C  aspirin 81 MG chewable tablet Chew 1 tablet (81 mg total) by mouth daily. 11/23/13   Kristen N Garis, DO  atorvastatin (LIPITOR) 80 MG tablet Take 1 tablet (80 mg total) by mouth daily. Patient taking differently: Take 80 mg by mouth at bedtime.  02/13/14   Nicole Pisciotta, PA-C  benztropine (COGENTIN) 0.5 MG tablet Take 0.5 mg by mouth 2 (two) times daily.    Historical Provider, MD  clopidogrel (PLAVIX) 75 MG tablet Take 1 tablet (75 mg total) by mouth daily. 02/13/14   Nicole Pisciotta, PA-C  divalproex (DEPAKOTE) 500 MG DR tablet Take 1 tablet (500 mg total) by mouth 2 (two) times daily. 02/13/14   Nicole Pisciotta, PA-C  famotidine (PEPCID) 20 MG tablet Take 1 tablet (20 mg total) by mouth 2 (two) times daily. Patient not taking: Reported on 05/02/2014 02/13/14   Elmyra Ricks Pisciotta, PA-C  folic acid (FOLVITE) 1 MG tablet Place  1 tablet (1 mg total) into feeding tube daily. Patient not taking: Reported on 05/02/2014 07/23/13   Donita Brooks, NP  furosemide (LASIX) 40 MG tablet Take 1 tablet (40 mg total) by mouth daily. 02/13/14   Nicole Pisciotta, PA-C  hydrALAZINE (APRESOLINE) 10 MG tablet Take 1 tablet (10 mg total) by mouth 2 (two) times daily. 02/13/14   Nicole Pisciotta, PA-C  levETIRAcetam (KEPPRA) 1000 MG tablet Take 1 tablet (1,000 mg total) by mouth 2 (two) times daily. 02/13/14   Nicole Pisciotta, PA-C  lisinopril (ZESTRIL) 2.5 MG tablet Take 1  tablet (2.5 mg total) by mouth daily. 02/13/14   Nicole Pisciotta, PA-C  LORazepam (ATIVAN) 0.5 MG tablet Take 0.5 mg by mouth at bedtime as needed for anxiety.    Historical Provider, MD  meloxicam (MOBIC) 15 MG tablet Take 1 tablet (15 mg total) by mouth daily. 11/13/13   Kaitlyn Szekalski, PA-C  metoprolol tartrate (LOPRESSOR) 25 MG tablet Take 0.5 tablets (12.5 mg total) by mouth 2 (two) times daily. Patient taking differently: Take 25 mg by mouth daily.  02/13/14   Nicole Pisciotta, PA-C  oxyCODONE (ROXICODONE) 5 MG immediate release tablet Take one tablet by mouth twice daily for pain Patient taking differently: Take 5 mg by mouth daily as needed for severe pain. Take one tablet by mouth twice daily for pain 10/01/13   Tiffany L Reed, DO  QUEtiapine (SEROQUEL) 50 MG tablet Take 1 tablet (50 mg total) by mouth at bedtime. 02/13/14   Nicole Pisciotta, PA-C  traZODone (DESYREL) 100 MG tablet Take 1 tablet by mouth at bedtime as needed. 05/13/14   Historical Provider, MD   BP 101/65 mmHg  Pulse 65  Temp(Src) 99.2 F (37.3 C) (Rectal)  Resp 16  Ht 6\' 1"  (1.854 m)  Wt 126 lb 9.6 oz (57.425 kg)  BMI 16.71 kg/m2  SpO2 97% Physical Exam  Constitutional: He is oriented to person, place, and time. He appears well-developed and well-nourished. No distress.  HENT:  Head: Normocephalic and atraumatic.  Mouth/Throat: Oropharynx is clear and moist. No oropharyngeal exudate.  No evidence of hematoma or abrasion  Eyes: Conjunctivae and EOM are normal. Pupils are equal, round, and reactive to light.  Dilated pupils bilaterally  Neck: Normal range of motion. Neck supple.  No meningismus. No C-spine tenderness  Cardiovascular: Normal rate, regular rhythm, normal heart sounds and intact distal pulses.   No murmur heard. Pulmonary/Chest: Effort normal and breath sounds normal. No respiratory distress.  Abdominal: Soft. There is no tenderness. There is no rebound and no guarding.  Musculoskeletal: Normal  range of motion. He exhibits no edema or tenderness.  Neurological: He is alert and oriented to person, place, and time. No cranial nerve deficit. He exhibits normal muscle tone. Coordination normal.  Somnolent, arousable to voice, follows commands, moving all function is bilaterally.  Bilateral ankle clonus, 5-6 beats.  Skin: Skin is warm.  Psychiatric: He has a normal mood and affect. His behavior is normal.  Nursing note and vitals reviewed.   ED Course  Procedures (including critical care time) Labs Review Labs Reviewed  CBC WITH DIFFERENTIAL/PLATELET - Abnormal; Notable for the following:    RBC 3.82 (*)    Hemoglobin 12.0 (*)    HCT 34.8 (*)    All other components within normal limits  COMPREHENSIVE METABOLIC PANEL - Abnormal; Notable for the following:    Sodium 130 (*)    Potassium 3.3 (*)    Chloride 95 (*)    Glucose,  Bld 149 (*)    Creatinine, Ser 0.57 (*)    Calcium 8.5 (*)    Albumin 3.4 (*)    ALT 12 (*)    All other components within normal limits  URINE RAPID DRUG SCREEN (HOSP PERFORMED) NOT AT [facility] - Abnormal; Notable for the following:    Tetrahydrocannabinol POSITIVE (*)    All other components within normal limits  VALPROIC ACID LEVEL - Abnormal; Notable for the following:    Valproic Acid Lvl 12 (*)    All other components within normal limits  LIPASE, BLOOD - Abnormal; Notable for the following:    Lipase 257 (*)    All other components within normal limits  URINALYSIS, ROUTINE W REFLEX MICROSCOPIC (NOT AT [facility]) - Abnormal; Notable for the following:    Color, Urine STRAW (*)    All other components within normal limits  TROPONIN I - Abnormal; Notable for the following:    Troponin I 0.15 (*)    All other components within normal limits  TROPONIN I  ETHANOL  AMMONIA  CK  MAGNESIUM  PHOSPHORUS  MAGNESIUM  PHOSPHORUS  TSH  COMPREHENSIVE METABOLIC PANEL  CBC  PROTIME-INR  TROPONIN I  TROPONIN I  HIV ANTIBODY (ROUTINE TESTING)  HEPATITIS  PANEL, ACUTE    Imaging Review Dg Chest 2 View  06/09/2014   CLINICAL DATA:  Seizure today, history asthma, hypertension, smoking, GERD, alcohol abuse, schizophrenia, pancreatitis, coronary artery disease  EXAM: CHEST  2 VIEW  COMPARISON:  06/06/2014  FINDINGS: Normal heart size, mediastinal contours, and pulmonary vascularity.  Lungs hyperinflated but clear.  No pleural effusion or pneumothorax.  Bones demineralized.  IMPRESSION: Hyperinflation without acute infiltrate.   Electronically Signed   By: Lavonia Dana M.D.   On: 06/09/2014 18:01   Ct Head Wo Contrast  06/09/2014   CLINICAL DATA:  Unwitnessed seizure at home. Found postictal. History of seizures.  EXAM: CT HEAD WITHOUT CONTRAST  TECHNIQUE: Contiguous axial images were obtained from the base of the skull through the vertex without intravenous contrast.  COMPARISON:  04/11/2014.  FINDINGS: Premature for age cerebral and cerebellar atrophy. This affects the temporal lobes particularly. No visible acute stroke, hemorrhage, intra-axial mass lesion, inflammatory process, or extra-axial fluid. Calvarium intact. LEFT maxillary sinus acute air-fluid level with foamy secretions. Negative orbits. No mastoid fluid. Similar appearance to priors.  IMPRESSION: Premature atrophy. No acute or focal intracranial abnormality. No interval change most recent priors.  Acute LEFT maxillary sinusitis.   Electronically Signed   By: Rolla Flatten M.D.   On: 06/09/2014 18:07     EKG Interpretation   Date/Time:  Sunday June 09 2014 17:03:13 EDT Ventricular Rate:  90 PR Interval:  172 QRS Duration: 99 QT Interval:  387 QTC Calculation: 473 R Axis:   94 Text Interpretation:  Sinus rhythm Consider left atrial enlargement  Borderline right axis deviation No significant change was found Confirmed  by Wyvonnia Dusky  MD, Harim Bi [phone]) on 06/09/2014 5:12:16 PM      MDM   Final diagnoses:  Seizure disorder  Alcohol-induced acute pancreatitis   Patient from home after  witnessed seizure. History of same. Recent admission he left AMA for chest pain.  Labs remarkable for mild hyponatremia of 130. Lipase elevated 257.  CT head is negative. Troponin negative. Patient given IV fluids.  Remains postictal. Sodium does not seem to be low enough to cause seizure.  he is more awake. He is oriented to person and place. He denies any chest pain.  He endorses an epigastric pain. He does believe that he had a seizure. Concern for possible alcohol withdrawal though patient states he no longer drinks cannot tell me when his last drink was. He is still confused. He is not tachycardic though some alcohol withdrawal seems less likely.  We'll place CIWA orders. Plan admission as he is still postictal and has epigastric pain. His chest pain workup from the other day was not completed either.  Ezequiel Essex, MD 06/10/14 [phone]

## 2014-06-09 NOTE — ED Notes (Signed)
Went into room as HR was noticed to be in the 140's. Pt had gotten out of bed, attempting to urinate. Pt urinated on the floor. Encouraged pt to get back into bed, and call out when he needed help. Pt handed urinal, call bell in reach.

## 2014-06-09 NOTE — ED Notes (Signed)
Patient oriented to person and place.  More verbal, states he is here because he had a seizure. Alert.

## 2014-06-10 ENCOUNTER — Ambulatory Visit (HOSPITAL_COMMUNITY): Payer: Medicare Other

## 2014-06-10 ENCOUNTER — Inpatient Hospital Stay (HOSPITAL_COMMUNITY): Payer: Medicare Other

## 2014-06-10 DIAGNOSIS — F209 Schizophrenia, unspecified: Secondary | ICD-10-CM

## 2014-06-10 DIAGNOSIS — J0100 Acute maxillary sinusitis, unspecified: Secondary | ICD-10-CM | POA: Diagnosis present

## 2014-06-10 DIAGNOSIS — F1010 Alcohol abuse, uncomplicated: Secondary | ICD-10-CM

## 2014-06-10 DIAGNOSIS — K859 Acute pancreatitis, unspecified: Secondary | ICD-10-CM

## 2014-06-10 DIAGNOSIS — R079 Chest pain, unspecified: Secondary | ICD-10-CM

## 2014-06-10 DIAGNOSIS — G40909 Epilepsy, unspecified, not intractable, without status epilepticus: Principal | ICD-10-CM

## 2014-06-10 LAB — CBC
HEMATOCRIT: 34.7 % — AB (ref 39.0–52.0)
HEMOGLOBIN: 12 g/dL — AB (ref 13.0–17.0)
MCH: 31.3 pg (ref 26.0–34.0)
MCHC: 34.6 g/dL (ref 30.0–36.0)
MCV: 90.4 fL (ref 78.0–100.0)
Platelets: 262 10*3/uL (ref 150–400)
RBC: 3.84 MIL/uL — ABNORMAL LOW (ref 4.22–5.81)
RDW: 13.7 % (ref 11.5–15.5)
WBC: 5.6 10*3/uL (ref 4.0–10.5)

## 2014-06-10 LAB — VALPROIC ACID LEVEL: Valproic Acid Lvl: 35 ug/mL — ABNORMAL LOW (ref 50.0–100.0)

## 2014-06-10 LAB — COMPREHENSIVE METABOLIC PANEL
ALBUMIN: 2.8 g/dL — AB (ref 3.5–5.0)
ALK PHOS: 44 U/L (ref 38–126)
ALT: 13 U/L — AB (ref 17–63)
ANION GAP: 8 (ref 5–15)
AST: 18 U/L (ref 15–41)
BILIRUBIN TOTAL: 0.3 mg/dL (ref 0.3–1.2)
BUN: 6 mg/dL (ref 6–20)
CO2: 27 mmol/L (ref 22–32)
Calcium: 8.6 mg/dL — ABNORMAL LOW (ref 8.9–10.3)
Chloride: 107 mmol/L (ref 101–111)
Creatinine, Ser: 0.53 mg/dL — ABNORMAL LOW (ref 0.61–1.24)
GFR calc Af Amer: >60 mL/min (ref >60)
GFR calc non Af Amer: >60 mL/min (ref >60)
Glucose, Bld: 130 mg/dL — ABNORMAL HIGH (ref 65–99)
Potassium: 3.1 mmol/L — ABNORMAL LOW (ref 3.5–5.1)
SODIUM: 142 mmol/L (ref 135–145)
TOTAL PROTEIN: 5.9 g/dL — AB (ref 6.5–8.1)

## 2014-06-10 LAB — PROTIME-INR
INR: 1.14 (ref 0.00–1.49)
Prothrombin Time: 14.8 seconds (ref 11.6–15.2)

## 2014-06-10 LAB — TROPONIN I
TROPONIN I: 0.06 ng/mL — AB (ref <0.031)
Troponin I: 0.1 ng/mL — ABNORMAL HIGH (ref <0.031)
Troponin I: 0.15 ng/mL — ABNORMAL HIGH (ref <0.031)

## 2014-06-10 LAB — MAGNESIUM
MAGNESIUM: 2 mg/dL (ref 1.7–2.4)
Magnesium: 2 mg/dL (ref 1.7–2.4)

## 2014-06-10 LAB — TSH: TSH: 1.837 u[IU]/mL (ref 0.350–4.500)

## 2014-06-10 LAB — HIV ANTIBODY (ROUTINE TESTING W REFLEX): HIV Screen 4th Generation wRfx: NONREACTIVE

## 2014-06-10 LAB — LEVETIRACETAM LEVEL: Levetiracetam Lvl: 19 ug/mL (ref 10.0–40.0)

## 2014-06-10 LAB — PHOSPHORUS
Phosphorus: 3 mg/dL (ref 2.5–4.6)
Phosphorus: 3.6 mg/dL (ref 2.5–4.6)

## 2014-06-10 LAB — LIPASE, BLOOD: Lipase: 316 U/L — ABNORMAL HIGH (ref 22–51)

## 2014-06-10 MED ORDER — PANTOPRAZOLE SODIUM 40 MG IV SOLR
40.0000 mg | Freq: Two times a day (BID) | INTRAVENOUS | Status: DC
Start: 2014-06-10 — End: 2014-06-11
  Administered 2014-06-10 – 2014-06-11 (×3): 40 mg via INTRAVENOUS
  Filled 2014-06-10 (×5): qty 40

## 2014-06-10 MED ORDER — METOPROLOL TARTRATE 1 MG/ML IV SOLN
5.0000 mg | Freq: Three times a day (TID) | INTRAVENOUS | Status: DC
  Administered 2014-06-10 (×2): 5 mg via INTRAVENOUS
  Filled 2014-06-10: qty 5

## 2014-06-10 MED ORDER — SODIUM CHLORIDE 0.9 % IV SOLN
INTRAVENOUS | Status: DC
  Administered 2014-06-10 – 2014-06-11 (×3): via INTRAVENOUS
  Filled 2014-06-10 (×8): qty 1000

## 2014-06-10 MED ORDER — CETYLPYRIDINIUM CHLORIDE 0.05 % MT LIQD
7.0000 mL | Freq: Two times a day (BID) | OROMUCOSAL | Status: DC
  Administered 2014-06-11: 7 mL via OROMUCOSAL

## 2014-06-10 MED ORDER — SODIUM CHLORIDE 0.9 % IV SOLN
INTRAVENOUS | Status: DC
  Filled 2014-06-10: qty 1000

## 2014-06-10 MED ORDER — METOPROLOL TARTRATE 12.5 MG HALF TABLET
12.5000 mg | ORAL_TABLET | Freq: Two times a day (BID) | ORAL | Status: DC
  Administered 2014-06-11: 12.5 mg via ORAL
  Filled 2014-06-10 (×3): qty 1

## 2014-06-10 MED ORDER — ENSURE ENLIVE PO LIQD
237.0000 mL | Freq: Two times a day (BID) | ORAL | Status: DC
  Administered 2014-06-11: 237 mL via ORAL

## 2014-06-10 MED ORDER — LEVETIRACETAM 750 MG PO TABS
1250.0000 mg | ORAL_TABLET | Freq: Two times a day (BID) | ORAL | Status: DC
  Administered 2014-06-10 – 2014-06-11 (×2): 1250 mg via ORAL
  Filled 2014-06-10 (×3): qty 1

## 2014-06-10 MED ORDER — POTASSIUM CHLORIDE CRYS ER 20 MEQ PO TBCR
40.0000 meq | EXTENDED_RELEASE_TABLET | ORAL | Status: AC
  Administered 2014-06-10 (×2): 40 meq via ORAL
  Filled 2014-06-10: qty 2

## 2014-06-10 NOTE — Progress Notes (Signed)
Echocardiogram 2D Echocardiogram has been performed.  Chase Blackwell 06/10/2014, 11:14 AM

## 2014-06-10 NOTE — Consult Note (Signed)
Reason for Consult: Minimally elevated troponin I Referring Physician: [facility] hospitalist  Zykeem L Mooney is an 49 y.o. male.  HPI: Patient is 49 year old male with past medical history significant for the hospital V. fib cardiac arrest, history of ST elevation MI in July 2015 status post PCI to proximal and mid 100% occluded RCA using 3.033 mm long  drug-eluting stent, hypertension, history of bronchial asthma, GERD, history of schizoaffective disorder, alcohol abuse, seizure disorder, noncompliant to follow-up was admitted yesterday as patient was found postictal by his brother. Patient was seen in the ED 2 days prior to this admission because of vague chest pain and abdominal pain and was noted to have minimally elevated troponin I and elevated lipase suggestive of acute pancreatitis patient signed [facility] Cardiologic consultation is called because of mildly elevated troponin I. Patient presently is drowsy and sleepy denies any complaints and wanted to leave the hospital [place]. Patient refuses for any further cardiac evaluation.  Past Medical History  Diagnosis Date  . Hypertension   . Asthma   . GERD (gastroesophageal reflux disease)   . Chronic pain   . Schizophrenia   . Alcohol abuse   . Seizures   . Pancreatitis   . Depression   . CAD (coronary artery disease)     Past Surgical History  Procedure Laterality Date  . Left heart catheterization with coronary angiogram N/A 07/06/2013    Procedure: LEFT HEART CATHETERIZATION WITH CORONARY ANGIOGRAM;  Surgeon: Clent Demark, MD;  Location: [facility] CATH LAB;  Service: Cardiovascular;  Laterality: N/A;  . Percutaneous coronary stent intervention (pci-s)  07/06/2013    Procedure: PERCUTANEOUS CORONARY STENT INTERVENTION (PCI-S);  Surgeon: Clent Demark, MD;  Location: [facility] CATH LAB;  Service: Cardiovascular;;    Family History  Problem Relation Age of Onset  . Malignant hyperthermia Mother   . Cirrhosis Father   . Alcohol abuse  Father     Social History:  reports that he has been smoking Cigarettes.  He has been smoking about 0.00 packs per day. He does not have any smokeless tobacco history on file. He reports that he drinks alcohol. He reports that he uses illicit drugs (Marijuana).  Allergies:  Allergies  Allergen Reactions  . Hctz [Hydrochlorothiazide] Other (See Comments)    Dizzy spells  . Hydroxyzine Hives  . Sulfonamide Derivatives Hives  . Cetirizine & Related Other (See Comments)    unknown    Medications: I have reviewed the patient's current medications.  Results for orders placed or performed during the hospital encounter of 06/09/14 (from the past 48 hour(s))  CBC with Differential/Platelet     Status: Abnormal   Collection Time: 06/09/14  5:05 PM  Result Value Ref Range   WBC 7.4 4.0 - 10.5 K/uL   RBC 3.82 (L) 4.22 - 5.81 MIL/uL   Hemoglobin 12.0 (L) 13.0 - 17.0 g/dL   HCT 34.8 (L) 39.0 - 52.0 %   MCV 91.1 78.0 - 100.0 fL   MCH 31.4 26.0 - 34.0 pg   MCHC 34.5 30.0 - 36.0 g/dL   RDW 13.5 11.5 - 15.5 %   Platelets 237 150 - 400 K/uL   Neutrophils Relative % 75 43 - 77 %   Neutro Abs 5.6 1.7 - 7.7 K/uL   Lymphocytes Relative 15 12 - 46 %   Lymphs Abs 1.1 0.7 - 4.0 K/uL   Monocytes Relative 9 3 - 12 %   Monocytes Absolute 0.6 0.1 - 1.0 K/uL   Eosinophils  Relative 1 0 - 5 %   Eosinophils Absolute 0.1 0.0 - 0.7 K/uL   Basophils Relative 0 0 - 1 %   Basophils Absolute 0.0 0.0 - 0.1 K/uL  Comprehensive metabolic panel     Status: Abnormal   Collection Time: 06/09/14  5:05 PM  Result Value Ref Range   Sodium 130 (L) 135 - 145 mmol/L   Potassium 3.3 (L) 3.5 - 5.1 mmol/L   Chloride 95 (L) 101 - 111 mmol/L   CO2 25 22 - 32 mmol/L   Glucose, Bld 149 (H) 65 - 99 mg/dL   BUN 6 6 - 20 mg/dL   Creatinine, Ser 0.57 (L) 0.61 - 1.24 mg/dL   Calcium 8.5 (L) 8.9 - 10.3 mg/dL   Total Protein 6.5 6.5 - 8.1 g/dL   Albumin 3.4 (L) 3.5 - 5.0 g/dL   AST 15 15 - 41 U/L   ALT 12 (L) 17 - 63 U/L    Alkaline Phosphatase 48 38 - 126 U/L   Total Bilirubin 0.3 0.3 - 1.2 mg/dL   GFR calc non Af Amer >60 >60 mL/min   GFR calc Af Amer >60 >60 mL/min    Comment: (NOTE) The eGFR has been calculated using the CKD EPI equation. This calculation has not been validated in all clinical situations. eGFR's persistently <60 mL/min signify possible Chronic Kidney Disease.    Anion gap 10 5 - 15  Troponin I     Status: None   Collection Time: 06/09/14  5:05 PM  Result Value Ref Range   Troponin I <0.03 <0.031 ng/mL    Comment:        NO INDICATION OF MYOCARDIAL INJURY.   Ethanol     Status: None   Collection Time: 06/09/14  5:05 PM  Result Value Ref Range   Alcohol, Ethyl (B) <5 <5 mg/dL    Comment:        LOWEST DETECTABLE LIMIT FOR SERUM ALCOHOL IS 11 mg/dL FOR MEDICAL PURPOSES ONLY   Valproic acid level     Status: Abnormal   Collection Time: 06/09/14  5:05 PM  Result Value Ref Range   Valproic Acid Lvl 12 (L) 50.0 - 100.0 ug/mL    Comment: Performed at [facility]  Lipase, blood     Status: Abnormal   Collection Time: 06/09/14  5:05 PM  Result Value Ref Range   Lipase 257 (H) 22 - 51 U/L  Ammonia     Status: None   Collection Time: 06/09/14  5:05 PM  Result Value Ref Range   Ammonia 19 9 - 35 umol/L  CK     Status: None   Collection Time: 06/09/14  5:05 PM  Result Value Ref Range   Total CK 105 49 - 397 U/L  Urine rapid drug screen (hosp performed)not at [facility]     Status: Abnormal   Collection Time: 06/09/14  6:33 PM  Result Value Ref Range   Opiates NONE DETECTED NONE DETECTED   Cocaine NONE DETECTED NONE DETECTED   Benzodiazepines NONE DETECTED NONE DETECTED   Amphetamines NONE DETECTED NONE DETECTED   Tetrahydrocannabinol POSITIVE (A) NONE DETECTED   Barbiturates NONE DETECTED NONE DETECTED    Comment:        DRUG SCREEN FOR MEDICAL PURPOSES ONLY.  IF CONFIRMATION IS NEEDED FOR ANY PURPOSE, NOTIFY LAB WITHIN 5 DAYS.        LOWEST DETECTABLE LIMITS FOR  URINE DRUG SCREEN Drug Class  Cutoff (ng/mL) Amphetamine      1000 Barbiturate      200 Benzodiazepine   248 Tricyclics       250 Opiates          300 Cocaine          300 THC              50   Urinalysis, Routine w reflex microscopic (not at [facility]     Status: Abnormal   Collection Time: 06/09/14  6:33 PM  Result Value Ref Range   Color, Urine STRAW (A) YELLOW   APPearance CLEAR CLEAR   Specific Gravity, Urine 1.010 1.005 - 1.030   pH 7.0 5.0 - 8.0   Glucose, UA NEGATIVE NEGATIVE mg/dL   Hgb urine dipstick NEGATIVE NEGATIVE   Bilirubin Urine NEGATIVE NEGATIVE   Ketones, ur NEGATIVE NEGATIVE mg/dL   Protein, ur NEGATIVE NEGATIVE mg/dL   Urobilinogen, UA 0.2 0.0 - 1.0 mg/dL   Nitrite NEGATIVE NEGATIVE   Leukocytes, UA NEGATIVE NEGATIVE    Comment: MICROSCOPIC NOT DONE ON URINES WITH NEGATIVE PROTEIN, BLOOD, LEUKOCYTES, NITRITE, OR GLUCOSE <1000 mg/dL.  Troponin I     Status: Abnormal   Collection Time: 06/09/14 11:30 PM  Result Value Ref Range   Troponin I 0.15 (H) <0.031 ng/mL    Comment:        PERSISTENTLY INCREASED TROPONIN VALUES IN THE RANGE OF 0.04-0.49 ng/mL CAN BE SEEN IN:       -UNSTABLE ANGINA       -CONGESTIVE HEART FAILURE       -MYOCARDITIS       -CHEST TRAUMA       -ARRYHTHMIAS       -LATE PRESENTING MYOCARDIAL INFARCTION       -COPD   CLINICAL FOLLOW-UP RECOMMENDED.   Magnesium     Status: None   Collection Time: 06/09/14 11:30 PM  Result Value Ref Range   Magnesium 2.0 1.7 - 2.4 mg/dL  Phosphorus     Status: None   Collection Time: 06/09/14 11:30 PM  Result Value Ref Range   Phosphorus 3.6 2.5 - 4.6 mg/dL  HIV antibody     Status: None   Collection Time: 06/09/14 11:30 PM  Result Value Ref Range   HIV Screen 4th Generation wRfx Non Reactive Non Reactive    Comment: (NOTE) Performed At: [facility] [place], [place] [id number] Lindon Romp MD VQ:[id number]   Magnesium     Status: None   Collection Time:  06/10/14  5:24 AM  Result Value Ref Range   Magnesium 2.0 1.7 - 2.4 mg/dL  Phosphorus     Status: None   Collection Time: 06/10/14  5:24 AM  Result Value Ref Range   Phosphorus 3.0 2.5 - 4.6 mg/dL  TSH     Status: None   Collection Time: 06/10/14  5:24 AM  Result Value Ref Range   TSH 1.837 0.350 - 4.500 uIU/mL  Comprehensive metabolic panel     Status: Abnormal   Collection Time: 06/10/14  5:24 AM  Result Value Ref Range   Sodium 142 135 - 145 mmol/L   Potassium 3.1 (L) 3.5 - 5.1 mmol/L   Chloride 107 101 - 111 mmol/L   CO2 27 22 - 32 mmol/L   Glucose, Bld 130 (H) 65 - 99 mg/dL   BUN 6 6 - 20 mg/dL   Creatinine, Ser 0.53 (L) 0.61 - 1.24 mg/dL   Calcium 8.6 (L) 8.9 -  10.3 mg/dL   Total Protein 5.9 (L) 6.5 - 8.1 g/dL   Albumin 2.8 (L) 3.5 - 5.0 g/dL   AST 18 15 - 41 U/L   ALT 13 (L) 17 - 63 U/L   Alkaline Phosphatase 44 38 - 126 U/L   Total Bilirubin 0.3 0.3 - 1.2 mg/dL   GFR calc non Af Amer >60 >60 mL/min   GFR calc Af Amer >60 >60 mL/min    Comment: (NOTE) The eGFR has been calculated using the CKD EPI equation. This calculation has not been validated in all clinical situations. eGFR's persistently <60 mL/min signify possible Chronic Kidney Disease.    Anion gap 8 5 - 15  CBC     Status: Abnormal   Collection Time: 06/10/14  5:24 AM  Result Value Ref Range   WBC 5.6 4.0 - 10.5 K/uL   RBC 3.84 (L) 4.22 - 5.81 MIL/uL   Hemoglobin 12.0 (L) 13.0 - 17.0 g/dL   HCT 34.7 (L) 39.0 - 52.0 %   MCV 90.4 78.0 - 100.0 fL   MCH 31.3 26.0 - 34.0 pg   MCHC 34.6 30.0 - 36.0 g/dL   RDW 13.7 11.5 - 15.5 %   Platelets 262 150 - 400 K/uL  Protime-INR     Status: None   Collection Time: 06/10/14  5:24 AM  Result Value Ref Range   Prothrombin Time 14.8 11.6 - 15.2 seconds   INR 1.14 0.00 - 1.49  Troponin I     Status: Abnormal   Collection Time: 06/10/14  5:24 AM  Result Value Ref Range   Troponin I 0.10 (H) <0.031 ng/mL    Comment:        PERSISTENTLY INCREASED TROPONIN VALUES  IN THE RANGE OF 0.04-0.49 ng/mL CAN BE SEEN IN:       -UNSTABLE ANGINA       -CONGESTIVE HEART FAILURE       -MYOCARDITIS       -CHEST TRAUMA       -ARRYHTHMIAS       -LATE PRESENTING MYOCARDIAL INFARCTION       -COPD   CLINICAL FOLLOW-UP RECOMMENDED.   Troponin I     Status: Abnormal   Collection Time: 06/10/14 10:51 AM  Result Value Ref Range   Troponin I 0.06 (H) <0.031 ng/mL    Comment:        PERSISTENTLY INCREASED TROPONIN VALUES IN THE RANGE OF 0.04-0.49 ng/mL CAN BE SEEN IN:       -UNSTABLE ANGINA       -CONGESTIVE HEART FAILURE       -MYOCARDITIS       -CHEST TRAUMA       -ARRYHTHMIAS       -LATE PRESENTING MYOCARDIAL INFARCTION       -COPD   CLINICAL FOLLOW-UP RECOMMENDED.   Valproic acid level     Status: Abnormal   Collection Time: 06/10/14 10:51 AM  Result Value Ref Range   Valproic Acid Lvl 35 (L) 50.0 - 100.0 ug/mL  Lipase, blood     Status: Abnormal   Collection Time: 06/10/14 10:51 AM  Result Value Ref Range   Lipase 316 (H) 22 - 51 U/L    Dg Chest 2 View  06/09/2014   CLINICAL DATA:  Seizure today, history asthma, hypertension, smoking, GERD, alcohol abuse, schizophrenia, pancreatitis, coronary artery disease  EXAM: CHEST  2 VIEW  COMPARISON:  06/06/2014  FINDINGS: Normal heart size, mediastinal contours, and pulmonary vascularity.  Lungs hyperinflated but  clear.  No pleural effusion or pneumothorax.  Bones demineralized.  IMPRESSION: Hyperinflation without acute infiltrate.   Electronically Signed   By: Lavonia Dana M.D.   On: 06/09/2014 18:01   Ct Head Wo Contrast  06/09/2014   CLINICAL DATA:  Unwitnessed seizure at home. Found postictal. History of seizures.  EXAM: CT HEAD WITHOUT CONTRAST  TECHNIQUE: Contiguous axial images were obtained from the base of the skull through the vertex without intravenous contrast.  COMPARISON:  04/11/2014.  FINDINGS: Premature for age cerebral and cerebellar atrophy. This affects the temporal lobes particularly. No visible  acute stroke, hemorrhage, intra-axial mass lesion, inflammatory process, or extra-axial fluid. Calvarium intact. LEFT maxillary sinus acute air-fluid level with foamy secretions. Negative orbits. No mastoid fluid. Similar appearance to priors.  IMPRESSION: Premature atrophy. No acute or focal intracranial abnormality. No interval change most recent priors.  Acute LEFT maxillary sinusitis.   Electronically Signed   By: Rolla Flatten M.D.   On: 06/09/2014 18:07    Review of Systems  Unable to perform ROS: mental acuity   Blood pressure 118/72, pulse 75, temperature 98 F (36.7 C), temperature source Oral, resp. rate 18, height $RemoveBe'6\' 1"'RacIsSyVv$  (1.854 m), weight 57.425 kg (126 lb 9.6 oz), SpO2 100 %. Physical Exam  Eyes: Conjunctivae are normal. Pupils are equal, round, and reactive to light.  Neck: Normal range of motion. Neck supple. No JVD present. No tracheal deviation present. No thyromegaly present.  Cardiovascular:  Bradycardic S1 and S2 soft no S3 gallop  Respiratory: Effort normal and breath sounds normal. No respiratory distress. He has no wheezes.  Musculoskeletal: He exhibits no edema or tenderness.  Neurological:  Awake drowsy no focal neuro deficit    Assessment/Plan: Probable recent very small non-Q-wave myocardial infarction Coronary artery disease history of inferior wall MI in July 2015 status post PCI to 100% occluded RCA Status post V. fib cardiac arrest in July 2015 Hypertension Acute alcoholic pancreatitis Hypercholesteremia   schizoaffective disorder EtOH abuse Seizure disorder Chronic pain syndrome History of bronchial asthma Plan DC IV Lopressor Start lopressor by mouth as per orders. Check labs and EKG in a.m. Medical Rx   Charolette Forward 06/10/2014, 6:30 PM

## 2014-06-10 NOTE — Progress Notes (Signed)
UR COMPLETED  

## 2014-06-10 NOTE — Progress Notes (Signed)
Pt eloped to [facility], returned by [employer]. In bed resting comfortably. Will continue to monitor.

## 2014-06-10 NOTE — Progress Notes (Signed)
Initial Nutrition Assessment  DOCUMENTATION CODES:  Severe malnutrition in context of chronic illness  INTERVENTION:  Ensure Enlive (each supplement provides 350kcal and 20 grams of protein), MVI  NUTRITION DIAGNOSIS:  Malnutrition related to social / environmental circumstances, chronic illness as evidenced by severe depletion of muscle mass, severe depletion of body fat, percent weight loss.   GOAL:  Patient will meet greater than or equal to 90% of their needs, Weight gain   MONITOR:  PO intake, Supplement acceptance, Diet advancement, Labs, Weight trends  REASON FOR ASSESSMENT:  Malnutrition Screening Tool    ASSESSMENT: 49 y.o. male with PMH of hypertension, GERD, depression, schizophrenia, seizure, alcoholic pancreatitis, tobacco abuse, alcohol abuse, coronary artery disease, history of STEMI, posterior status of stent placement and CABG. Patient brought in by ambulance after apparent seizure activity at home. Brother called EMS. He left the hospital AMA 2 days ago after admission for chest pain.  Pt reports losing from 170 lbs down to current weight of 126 lbs in the past year; states he doesn't know what caused weight loss. Weight history shows pt has lost 22% of his body weight in the past 5 months. He reports having a good appetite and eating well; he usually eats 2 meals per day. Pt is currently NPO and very hungry. Per nursing notes, pt ate 100% of dinner and drank 2 Ensure supplements last night.  When asked more questions, pt expressed that there was difficulty getting enough food at home due him being on disability and his brother only working part time. Pt states that he needs to re-apply for food stamps- RD encouraged pt to do this ASAP. Printed out information about local food resources around patients neighborhood from [employer].  Encouraged adequate PO intake once diet is advanced as well as intake of Ensure Enlive.   Labs: low potassium, low calcium, low  hemoglobin, high lipase  Height:  Ht Readings from Last 1 Encounters:  06/09/14 6\' 1"  (1.854 m)    Weight:  Wt Readings from Last 1 Encounters:  06/09/14 126 lb 9.6 oz (57.425 kg)    Ideal Body Weight:  83.6 kg  Wt Readings from Last 10 Encounters:  06/09/14 126 lb 9.6 oz (57.425 kg)  06/07/14 140 lb 3.2 oz (63.594 kg)  05/01/14 150 lb (68.04 kg)  02/09/14 200 lb (90.719 kg)  01/18/14 161 lb 13.1 oz (73.4 kg)  02/01/13 184 lb (83.462 kg)  07/31/08 236 lb 4 oz (107.162 kg)    BMI:  Body mass index is 16.71 kg/(m^2). (Underweight)  Estimated Nutritional Needs:  Kcal:  1900-2200  Protein:     Fluid:  1.9-2.2 L/day  Skin:  Reviewed, no issues  Diet Order:  Diet NPO time specified Except for: Sips with Meds, Ice Chips  EDUCATION NEEDS:  No education needs identified at this time   Intake/Output Summary (Last 24 hours) at 06/10/14 1316 Last data filed at 06/10/14 1306  Gross per 24 hour  Intake 2142.25 ml  Output   1150 ml  Net 992.25 ml    Last BM:  6/6  Pryor Ochoa RD, LDN Inpatient Clinical Dietitian Pager: [phone] After Hours Pager: [phone]

## 2014-06-10 NOTE — Progress Notes (Signed)
Pt. Requesting to leave the hospital to go see his mother, asking for food, and pulled out his peripheral IV. Explained to pt that it is against medical advice to leave, and that he is not able to eat because the doctors want to give his bowels a rest since he has pancreatitis. Pt. Continues to ask myself and other staff members for food and clothes to leave. MD aware, psych consult pending completion. Will continue to monitor.    06/10/2014 Penni Bombard, RN

## 2014-06-10 NOTE — Progress Notes (Signed)
CSW order in place for competency/guardianship.  MD is aware of concerns and is asking for a Psych consult- however- it should be noted that patient knows his name, address, family members, location and his home phone.  He could not relate the current month or year. Patient may not be making good decisions (brother reports that he drinks sometimes despite their warnings not to).  He does not appear to understand the relationship between his alcohol use and his seizures and medications.  Patient want to leave to go check on his mother who is a resident at [facility] and [facility].  He was noted to be anxious and trying to leave his room in his hospital gown. Patient's clothes are soiled; CSW was notified to obtain  clothes for patient- however- a phone call to his brother [name] was made and he will bring clothes for his brother.  There is concern that patient may decide to leave AMA.  His brother states he has done this in the past from the hospital.  Patient encouraged to remain in the hospital until medically stable and to await his brother's visit.  Will monitor for psych assessment and determination for competency.  Lorie Phenix. Pauline Good, [place]

## 2014-06-10 NOTE — Progress Notes (Signed)
[facility] PROGRESS NOTE  Chase Blackwell POE:[id number] DOB: January 02, 1966 DOA: 06/09/2014 PCP: [facility]  Assessment/Plan: #1 seizures Maybe secondary to medical noncompliance. Patient however states he's been compliant with his medications. CT head negative. Patient noted to be hypokalemic will replete. Magnesium level AT 2. Patient has been seen in consultation by neurology and recommended continue Depakote at current dose. Increase Keppra to 1250 mg twice a day. Seizure precautions. Ativan as needed. Appreciate neurology input and recommendations.  #2 chest pain/coronary artery disease Patient left the hospital AMA during his recent prior hospitalization without full workup for his chest pain. Cardiac enzymes are minimally elevated and seemed to be trending down. 2-D echo is pending. Metoprolol on hold. Continue Plavix. Consult with cardiology for further evaluation and management.  #3 acute pancreatitis Questionable etiology. Patient's alcohol level was less than 5. Patient is on Depakote. Patient with some abdominal pain however no nausea or vomiting. IV fluids. Trial of clear liquids. Pain management. Follow.  #4 Alcohol Abuse Continue the Ativan withdrawal protocol. Continue thiamine. Folic acid.  #5 hypokalemia Replete.  #6 hypertension Stable. Metoprolol on hold.  #7 schizophrenia Continue Depakote at current dose. Continue Cogentin. Continue Seroquel. Will consult with psychiatry for further evaluation and management as well as to assess for capacity. A #8 acute maxillary sinusitis Continue Augmentin.  #9 prophylaxis SCDs for DVT prophylaxis.  Code Status: Full Family Communication: Updated patient. No family present. Disposition Plan: Pending psychiatric evaluation.   Consultants:  Neurology: Dr. Doy Mince 06/10/2014  Cardiology: Dr. Terrence Dupont  Procedures:  CT head 06/09/2014  Chest x-ray 06/09/2014  2-D echo  pending  Antibiotics:  None  HPI/Subjective: Patient denies CP. No SOB. Patient endorses epigastric and lower abdominal pain.  Objective: Filed Vitals:   06/10/14 1327  BP: 118/72  Pulse: 75  Temp: 98 F (36.7 C)  Resp: 18    Intake/Output Summary (Last 24 hours) at 06/10/14 2007 Last data filed at 06/10/14 1306  Gross per 24 hour  Intake 2142.25 ml  Output   1150 ml  Net 992.25 ml   Filed Weights   06/09/14 2321  Weight: 57.425 kg (126 lb 9.6 oz)    Exam:   General:  NAD  Cardiovascular: RRR  Respiratory: CTAB  Abdomen: Soft/ TTP epigastrium and lower abd, positive bowel sounds, no rebound, no guarding  Musculoskeletal: No clubbing, cyanosis or edema.   Data Reviewed: Basic Metabolic Panel:  Recent Labs Lab 06/06/14 2206 06/07/14 0715 06/09/14 1705 06/09/14 2330 06/10/14 0524  NA 137 142 130*  --  142  K 3.6 4.0 3.3*  --  3.1*  CL 101 109 95*  --  107  CO2 26 26 25   --  27  GLUCOSE 97 105* 149*  --  130*  BUN 7 8 6   --  6  CREATININE 0.74 0.67 0.57*  --  0.53*  CALCIUM 8.9 8.3* 8.5*  --  8.6*  MG  --   --   --  2.0 2.0  PHOS  --   --   --  3.6 3.0   Liver Function Tests:  Recent Labs Lab 06/06/14 2313 06/09/14 1705 06/10/14 0524  AST 13* 15 18  ALT 10* 12* 13*  ALKPHOS 47 48 44  BILITOT 0.6 0.3 0.3  PROT 6.1* 6.5 5.9*  ALBUMIN 3.3* 3.4* 2.8*    Recent Labs Lab 06/06/14 2313 06/09/14 1705 06/10/14 1051  LIPASE 177* 257* 316*    Recent Labs Lab 06/09/14 1705  AMMONIA 19  CBC:  Recent Labs Lab 06/06/14 2206 06/07/14 0715 06/09/14 1705 06/10/14 0524  WBC 8.4 6.3 7.4 5.6  NEUTROABS  --   --  5.6  --   HGB 12.7* 11.2* 12.0* 12.0*  HCT 36.9* 33.9* 34.8* 34.7*  MCV 90.9 92.4 91.1 90.4  PLT 246 194 237 262   Cardiac Enzymes:  Recent Labs Lab 06/07/14 0715 06/09/14 1705 06/09/14 2330 06/10/14 0524 06/10/14 1051  CKTOTAL  --  105  --   --   --   TROPONINI <0.03 <0.03 0.15* 0.10* 0.06*   BNP (last 3  results)  Recent Labs  05/01/14 2153 06/06/14 2206  BNP 21.0 28.5    ProBNP (last 3 results)  Recent Labs  06/27/13 1933  PROBNP 146.2*    CBG:  Recent Labs Lab 06/07/14 0632  GLUCAP 112*    No results found for this or any previous visit (from the past 240 hour(s)).   Studies: Dg Chest 2 View  06/09/2014   CLINICAL DATA:  Seizure today, history asthma, hypertension, smoking, GERD, alcohol abuse, schizophrenia, pancreatitis, coronary artery disease  EXAM: CHEST  2 VIEW  COMPARISON:  06/06/2014  FINDINGS: Normal heart size, mediastinal contours, and pulmonary vascularity.  Lungs hyperinflated but clear.  No pleural effusion or pneumothorax.  Bones demineralized.  IMPRESSION: Hyperinflation without acute infiltrate.   Electronically Signed   By: Lavonia Dana M.D.   On: 06/09/2014 18:01   Ct Head Wo Contrast  06/09/2014   CLINICAL DATA:  Unwitnessed seizure at home. Found postictal. History of seizures.  EXAM: CT HEAD WITHOUT CONTRAST  TECHNIQUE: Contiguous axial images were obtained from the base of the skull through the vertex without intravenous contrast.  COMPARISON:  04/11/2014.  FINDINGS: Premature for age cerebral and cerebellar atrophy. This affects the temporal lobes particularly. No visible acute stroke, hemorrhage, intra-axial mass lesion, inflammatory process, or extra-axial fluid. Calvarium intact. LEFT maxillary sinus acute air-fluid level with foamy secretions. Negative orbits. No mastoid fluid. Similar appearance to priors.  IMPRESSION: Premature atrophy. No acute or focal intracranial abnormality. No interval change most recent priors.  Acute LEFT maxillary sinusitis.   Electronically Signed   By: Rolla Flatten M.D.   On: 06/09/2014 18:07    Scheduled Meds: . amoxicillin-clavulanate  1 tablet Oral Q12H  . [START ON 06/11/2014] antiseptic oral rinse  7 mL Mouth Rinse BID  . aspirin  81 mg Oral Daily  . atorvastatin  80 mg Oral QHS  . benztropine  0.5 mg Oral BID  .  clopidogrel  75 mg Oral Daily  . divalproex  500 mg Oral BID  . feeding supplement (ENSURE ENLIVE)  237 mL Oral BID BM  . fluticasone  2 spray Each Nare Daily  . folic acid  1 mg Oral Daily  . guaiFENesin  600 mg Oral BID  . levETIRAcetam  1,250 mg Oral BID  . LORazepam  0-4 mg Intravenous Q6H   Followed by  . [START ON 06/11/2014] LORazepam  0-4 mg Intravenous Q12H  . metoprolol tartrate  12.5 mg Oral BID  . multivitamin with minerals  1 tablet Oral Daily  . pantoprazole (PROTONIX) IV  40 mg Intravenous Q12H  . QUEtiapine  50 mg Oral QHS  . sodium chloride  3 mL Intravenous Q12H  . thiamine  100 mg Oral Daily   Or  . thiamine  100 mg Intravenous Daily   Continuous Infusions: . sodium chloride 0.9 % 1,000 mL with potassium chloride 40 mEq infusion 125 mL/hr  at 06/10/14 1744    Principal Problem:   Seizure disorder Active Problems:   Alcohol abuse   Essential hypertension   Acute pancreatitis   Tobacco abuse   CAD (coronary artery disease) of artery bypass graft   Chest pain   Schizophrenia   Hyponatremia   Sinusitis, acute maxillary    Time spent: 8 minutes    THOMPSON,DANIEL M.D. [facility] Pager [phone]. If 7PM-7AM, please contact night-coverage at [email or web address], password [facility] 06/10/2014, 8:07 PM  LOS: 1 day

## 2014-06-10 NOTE — Consult Note (Signed)
Reason for [place] Referring Physician: Grandville Silos  CC: Seizure  HPI: Chase Blackwell is an 49 y.o. male presenting 6/3 with complaints of chest pain and abdominal pain.  Left AMA and returned on yesterday after his brother found him down.  Was felt to be post-ictal.  EMS was called and patient was brought in for further treatment. Patient is on Keppra and Depakote.  He reports that he has been compliant with both recently.  Depakote level on admission was 12.  Patient has had no further seizures since admission but does appear to be having issues with his pancreatitis.    Past Medical History  Diagnosis Date  . Hypertension   . Asthma   . GERD (gastroesophageal reflux disease)   . Chronic pain   . Schizophrenia   . Alcohol abuse   . Seizures   . Pancreatitis   . Depression   . CAD (coronary artery disease)     Past Surgical History  Procedure Laterality Date  . Left heart catheterization with coronary angiogram N/A 07/06/2013    Procedure: LEFT HEART CATHETERIZATION WITH CORONARY ANGIOGRAM;  Surgeon: Clent Demark, MD;  Location: [facility] CATH LAB;  Service: Cardiovascular;  Laterality: N/A;  . Percutaneous coronary stent intervention (pci-s)  07/06/2013    Procedure: PERCUTANEOUS CORONARY STENT INTERVENTION (PCI-S);  Surgeon: Clent Demark, MD;  Location: [facility] CATH LAB;  Service: Cardiovascular;;    Family History  Problem Relation Age of Onset  . Malignant hyperthermia Mother   . Cirrhosis Father   . Alcohol abuse Father     Social History:  reports that he has been smoking Cigarettes.  He has been smoking about 0.00 packs per day. He does not have any smokeless tobacco history on file. He reports that he drinks alcohol. He reports that he uses illicit drugs (Marijuana).  Allergies  Allergen Reactions  . Hctz [Hydrochlorothiazide] Other (See Comments)    Dizzy spells  . Hydroxyzine Hives  . Sulfonamide Derivatives Hives  . Cetirizine & Related Other (See Comments)     unknown    Medications:  I have reviewed the patient's current medications. Prior to Admission:  Prescriptions prior to admission  Medication Sig Dispense Refill Last Dose  . atorvastatin (LIPITOR) 80 MG tablet Take 1 tablet (80 mg total) by mouth daily. (Patient taking differently: Take 80 mg by mouth at bedtime. ) 30 tablet 0 Unk  . benztropine (COGENTIN) 0.5 MG tablet Take 0.5 mg by mouth 2 (two) times daily.   Unk  . clopidogrel (PLAVIX) 75 MG tablet Take 1 tablet (75 mg total) by mouth daily. 30 tablet 0 Unk  . divalproex (DEPAKOTE) 500 MG DR tablet Take 1 tablet (500 mg total) by mouth 2 (two) times daily. 60 tablet 0 Unk  . famotidine (PEPCID) 20 MG tablet Take 1 tablet (20 mg total) by mouth 2 (two) times daily. 60 tablet 0 Unk  . furosemide (LASIX) 40 MG tablet Take 1 tablet (40 mg total) by mouth daily. 30 tablet 0 Unk  . haloperidol decanoate (HALDOL DECANOATE) 100 MG/ML injection Inject 1 mL into the muscle every 28 (twenty-eight) days.   Unk  . hydrALAZINE (APRESOLINE) 10 MG tablet Take 1 tablet (10 mg total) by mouth 2 (two) times daily. 60 tablet 0 Unk  . levETIRAcetam (KEPPRA) 1000 MG tablet Take 1 tablet (1,000 mg total) by mouth 2 (two) times daily. 60 tablet 0 Unk  . lisinopril (ZESTRIL) 2.5 MG tablet Take 1 tablet (2.5 mg total) by mouth  daily. 30 tablet 0 Unk  . LORazepam (ATIVAN) 0.5 MG tablet Take 0.5 mg by mouth at bedtime as needed for anxiety.   Unk  . meloxicam (MOBIC) 15 MG tablet Take 1 tablet (15 mg total) by mouth daily. 20 tablet 0 Unk  . metoprolol tartrate (LOPRESSOR) 25 MG tablet Take 0.5 tablets (12.5 mg total) by mouth 2 (two) times daily. (Patient taking differently: Take 25 mg by mouth daily. ) 30 tablet 0 Unk  . oxyCODONE (ROXICODONE) 5 MG immediate release tablet Take one tablet by mouth twice daily for pain (Patient taking differently: Take 5 mg by mouth daily as needed for severe pain. Take one tablet by mouth twice daily for pain) 60 tablet 0 Unk  .  QUEtiapine (SEROQUEL) 50 MG tablet Take 1 tablet (50 mg total) by mouth at bedtime. 30 tablet 0 Unk  . traZODone (DESYREL) 100 MG tablet Take 1 tablet by mouth at bedtime as needed for sleep.   2 Unk  . albuterol (PROVENTIL HFA;VENTOLIN HFA) 108 (90 BASE) MCG/ACT inhaler Inhale 2 puffs into the lungs every 4 (four) hours as needed for wheezing or shortness of breath. 1 Inhaler 3 unknown at unknown time  . aspirin 81 MG chewable tablet Chew 1 tablet (81 mg total) by mouth daily. 30 tablet 1 unknown at unknown time   Scheduled: . amoxicillin-clavulanate  1 tablet Oral Q12H  . [START ON 06/11/2014] antiseptic oral rinse  7 mL Mouth Rinse BID  . aspirin  81 mg Oral Daily  . atorvastatin  80 mg Oral QHS  . benztropine  0.5 mg Oral BID  . clopidogrel  75 mg Oral Daily  . divalproex  500 mg Oral BID  . feeding supplement (ENSURE ENLIVE)  237 mL Oral BID BM  . fluticasone  2 spray Each Nare Daily  . folic acid  1 mg Oral Daily  . guaiFENesin  600 mg Oral BID  . levETIRAcetam  1,000 mg Oral BID  . LORazepam  0-4 mg Intravenous Q6H   Followed by  . [START ON 06/11/2014] LORazepam  0-4 mg Intravenous Q12H  . metoprolol  5 mg Intravenous 3 times per day  . multivitamin with minerals  1 tablet Oral Daily  . pantoprazole (PROTONIX) IV  40 mg Intravenous Q12H  . potassium chloride  40 mEq Oral Q4H  . QUEtiapine  50 mg Oral QHS  . sodium chloride  3 mL Intravenous Q12H  . thiamine  100 mg Oral Daily   Or  . thiamine  100 mg Intravenous Daily    ROS: History obtained from the patient  General ROS: negative for - chills, fatigue, fever, night sweats, weight gain or weight loss Psychological ROS: negative for - behavioral disorder, hallucinations, memory difficulties, mood swings or suicidal ideation Ophthalmic ROS: negative for - blurry vision, double vision, eye pain or loss of vision ENT ROS: negative for - epistaxis, nasal discharge, oral lesions, sore throat, tinnitus or vertigo Allergy and  Immunology ROS: negative for - hives or itchy/watery eyes Hematological and Lymphatic ROS: negative for - bleeding problems, bruising or swollen lymph nodes Endocrine ROS: negative for - galactorrhea, hair pattern changes, polydipsia/polyuria or temperature intolerance Respiratory ROS: negative for - cough, hemoptysis, shortness of breath or wheezing Cardiovascular ROS: negative for - chest pain, dyspnea on exertion, edema or irregular heartbeat Gastrointestinal ROS: negative for - abdominal pain, diarrhea, hematemesis, nausea/vomiting or stool incontinence Genito-Urinary ROS: negative for - dysuria, hematuria, incontinence or urinary frequency/urgency Musculoskeletal ROS: negative for -  joint swelling or muscular weakness Neurological ROS: as noted in HPI Dermatological ROS: negative for rash and skin lesion changes  Physical Examination: Blood pressure 105/62, pulse 71, temperature 98.7 F (37.1 C), temperature source Oral, resp. rate 18, height 6\' 1"  (1.854 m), weight 57.425 kg (126 lb 9.6 oz), SpO2 96 %.  HEENT-  Normocephalic, no lesions, without obvious abnormality.  Normal external eye and conjunctiva.  Normal TM's bilaterally.  Normal auditory canals and external ears. Normal external nose, mucus membranes and septum.  Normal pharynx. Cardiovascular- S1, S2 normal, pulses palpable throughout   Lungs- chest clear, no wheezing, rales, normal symmetric air entry Abdomen- soft, non-tender; bowel sounds normal; no masses,  no organomegaly Extremities- no edema Lymph-no adenopathy palpable Musculoskeletal-no joint tenderness, deformity or swelling Skin-warm and dry, no hyperpigmentation, vitiligo, or suspicious lesions  Neurological Examination Mental Status: Alert, oriented, thought content appropriate with some difficulties with memory.  Speech fluent without evidence of aphasia.  Able to follow 3 step commands without difficulty. Cranial Nerves: II: Discs flat bilaterally; Visual  fields grossly normal, pupils equal, round, reactive to light and accommodation III,IV, VI: ptosis not present, extra-ocular motions intact bilaterally V,VII: smile symmetric, facial light touch sensation normal bilaterally VIII: hearing normal bilaterally IX,X: gag reflex present XI: bilateral shoulder shrug XII: midline tongue extension Motor: Right : Upper extremity   5/5    Left:     Upper extremity   5/5  Lower extremity   5/5     Lower extremity   5/5 Tone and bulk:normal tone throughout; no atrophy noted Sensory: Pinprick and light touch intact throughout, bilaterally Deep Tendon Reflexes: 2+ and symmetric throughout Plantars: Right: upgoing   Left: downgoing Cerebellar: normal finger-to-nose and normal heel-to-shin testing bilaterally Gait: normal gait and station when using IV pole   Laboratory Studies:   Basic Metabolic Panel:  Recent Labs Lab 06/06/14 2206 06/07/14 0715 06/09/14 1705 06/09/14 2330 06/10/14 0524  NA 137 142 130*  --  142  K 3.6 4.0 3.3*  --  3.1*  CL 101 109 95*  --  107  CO2 26 26 25   --  27  GLUCOSE 97 105* 149*  --  130*  BUN 7 8 6   --  6  CREATININE 0.74 0.67 0.57*  --  0.53*  CALCIUM 8.9 8.3* 8.5*  --  8.6*  MG  --   --   --  2.0 2.0  PHOS  --   --   --  3.6 3.0    Liver Function Tests:  Recent Labs Lab 06/06/14 2313 06/09/14 1705 06/10/14 0524  AST 13* 15 18  ALT 10* 12* 13*  ALKPHOS 47 48 44  BILITOT 0.6 0.3 0.3  PROT 6.1* 6.5 5.9*  ALBUMIN 3.3* 3.4* 2.8*    Recent Labs Lab 06/06/14 2313 06/09/14 1705  LIPASE 177* 257*    Recent Labs Lab 06/09/14 1705  AMMONIA 19    CBC:  Recent Labs Lab 06/06/14 2206 06/07/14 0715 06/09/14 1705 06/10/14 0524  WBC 8.4 6.3 7.4 5.6  NEUTROABS  --   --  5.6  --   HGB 12.7* 11.2* 12.0* 12.0*  HCT 36.9* 33.9* 34.8* 34.7*  MCV 90.9 92.4 91.1 90.4  PLT 246 194 237 262    Cardiac Enzymes:  Recent Labs Lab 06/07/14 0715 06/09/14 1705 06/09/14 2330 06/10/14 0524   CKTOTAL  --  105  --   --   TROPONINI <0.03 <0.03 0.15* 0.10*    BNP: Invalid input(s):  POCBNP  CBG:  Recent Labs Lab 06/07/14 0632  GLUCAP 112*    Microbiology: Results for orders placed or performed during the hospital encounter of 07/06/13  MRSA PCR Screening     Status: None   Collection Time: 07/06/13 11:38 PM  Result Value Ref Range Status   MRSA by PCR NEGATIVE NEGATIVE Final    Comment:        The GeneXpert MRSA Assay (FDA approved for NASAL specimens only), is one component of a comprehensive MRSA colonization surveillance program. It is not intended to diagnose MRSA infection nor to guide or monitor treatment for MRSA infections.  Urine culture     Status: None   Collection Time: 07/06/13 11:52 PM  Result Value Ref Range Status   Specimen Description URINE, CATHETERIZED  Final   Special Requests NONE  Final   Culture  Setup Time   Final    07/07/2013 12:52 Performed at [place] Performed at [facility]  Final   Culture NO GROWTH Performed at [facility]  Final   Report Status 07/08/2013 FINAL  Final  Culture, respiratory (NON-Expectorated)     Status: None   Collection Time: 07/09/13 11:40 AM  Result Value Ref Range Status   Specimen Description TRACHEAL ASPIRATE  Final   Special Requests NONE  Final   Gram Stain   Final    ABUNDANT WBC PRESENT,BOTH PMN AND MONONUCLEAR NO SQUAMOUS EPITHELIAL CELLS SEEN ABUNDANT GRAM NEGATIVE RODS RARE GRAM POSITIVE COCCI IN PAIRS   Culture   Final    ABUNDANT KLEBSIELLA PNEUMONIAE Performed at [facility]   Report Status 07/11/2013 FINAL  Final   Organism ID, Bacteria KLEBSIELLA PNEUMONIAE  Final      Susceptibility   Klebsiella pneumoniae - MIC*    AMPICILLIN RESISTANT      AMPICILLIN/SULBACTAM 4 SENSITIVE Sensitive     CEFAZOLIN <=4 SENSITIVE Sensitive     CEFEPIME <=1 SENSITIVE Sensitive     CEFTAZIDIME <=1 SENSITIVE Sensitive      CEFTRIAXONE <=1 SENSITIVE Sensitive     CIPROFLOXACIN <=0.25 SENSITIVE Sensitive     GENTAMICIN <=1 SENSITIVE Sensitive     IMIPENEM <=0.25 SENSITIVE Sensitive     PIP/TAZO <=4 SENSITIVE Sensitive     TOBRAMYCIN <=1 SENSITIVE Sensitive     TRIMETH/SULFA <=20 SENSITIVE Sensitive     * ABUNDANT KLEBSIELLA PNEUMONIAE  Culture, blood (routine x 2)     Status: None   Collection Time: 07/09/13  1:10 PM  Result Value Ref Range Status   Specimen Description BLOOD LEFT HAND  Final   Special Requests BOTTLES DRAWN AEROBIC ONLY 8CC  Final   Culture  Setup Time   Final    07/09/2013 18:46 Performed at [facility]   Culture   Final    STAPHYLOCOCCUS SPECIES (COAGULASE NEGATIVE) Note: THE SIGNIFICANCE OF ISOLATING THIS ORGANISM FROM A SINGLE SET OF BLOOD CULTURES WHEN MULTIPLE SETS ARE DRAWN IS UNCERTAIN. PLEASE NOTIFY THE MICROBIOLOGY DEPARTMENT WITHIN ONE WEEK IF SPECIATION AND SENSITIVITIES ARE REQUIRED. Note: Gram Stain Report Called to,Read Back By and Verified With: TONYA S@12 :08PM ON 07/10/13 BY DANTS Performed at [facility]   Report Status 07/11/2013 FINAL  Final  Culture, blood (routine x 2)     Status: None   Collection Time: 07/09/13  1:20 PM  Result Value Ref Range Status   Specimen Description BLOOD RIGHT HAND  Final   Special Requests BOTTLES DRAWN AEROBIC ONLY  [facility]  Final   Culture  Setup Time   Final    07/09/2013 18:46 Performed at [facility]   Culture   Final    NO GROWTH 5 DAYS Performed at [facility]   Report Status 07/15/2013 FINAL  Final  Culture, blood (routine x 2)     Status: None   Collection Time: 07/23/13 12:55 AM  Result Value Ref Range Status   Specimen Description BLOOD RIGHT ARM  Final   Special Requests BOTTLES DRAWN AEROBIC ONLY [facility]  Final   Culture  Setup Time   Final    07/23/2013 09:02 Performed at [facility]   Culture   Final    NO GROWTH 5 DAYS Performed at [facility]   Report Status  07/29/2013 FINAL  Final  Culture, blood (routine x 2)     Status: None   Collection Time: 07/23/13  1:00 AM  Result Value Ref Range Status   Specimen Description BLOOD RIGHT FOREARM  Final   Special Requests BOTTLES DRAWN AEROBIC ONLY 8CC  Final   Culture  Setup Time   Final    07/23/2013 09:03 Performed at [facility]   Culture   Final    NO GROWTH 5 DAYS Performed at [facility]   Report Status 07/29/2013 FINAL  Final  Urine culture     Status: None   Collection Time: 07/23/13  1:27 AM  Result Value Ref Range Status   Specimen Description URINE, CATHETERIZED  Final   Special Requests Normal  Final   Culture  Setup Time   Final    07/23/2013 01:49 Performed at [place] Performed at [facility]  Final   Culture NO GROWTH Performed at [facility]  Final   Report Status 07/24/2013 FINAL  Final  Culture, respiratory (NON-Expectorated)     Status: None   Collection Time: 07/23/13  3:09 AM  Result Value Ref Range Status   Specimen Description TRACHEAL ASPIRATE  Final   Special Requests NONE  Final   Gram Stain   Final    RARE WBC PRESENT, PREDOMINANTLY MONONUCLEAR RARE SQUAMOUS EPITHELIAL CELLS PRESENT FEW GRAM NEGATIVE RODS RARE GRAM POSITIVE COCCI IN PAIRS Performed at [facility]   Culture   Final    MODERATE PSEUDOMONAS AERUGINOSA MODERATE KLEBSIELLA PNEUMONIAE Performed at [facility]   Report Status 07/26/2013 FINAL  Final   Organism ID, Bacteria PSEUDOMONAS AERUGINOSA  Final   Organism ID, Bacteria KLEBSIELLA PNEUMONIAE  Final      Susceptibility   Klebsiella pneumoniae - MIC*    AMPICILLIN RESISTANT      AMPICILLIN/SULBACTAM 4 SENSITIVE Sensitive     CEFAZOLIN <=4 SENSITIVE Sensitive     CEFEPIME <=1 SENSITIVE Sensitive     CEFTAZIDIME <=1 SENSITIVE Sensitive     CEFTRIAXONE <=1 SENSITIVE Sensitive     CIPROFLOXACIN <=0.25 SENSITIVE Sensitive     GENTAMICIN <=1 SENSITIVE  Sensitive     IMIPENEM <=0.25 SENSITIVE Sensitive     PIP/TAZO <=4 SENSITIVE Sensitive     TOBRAMYCIN <=1 SENSITIVE Sensitive     TRIMETH/SULFA <=20 SENSITIVE Sensitive     * MODERATE KLEBSIELLA PNEUMONIAE   Pseudomonas aeruginosa - MIC*    CEFEPIME 2 SENSITIVE Sensitive     CEFTAZIDIME 4 SENSITIVE Sensitive     CIPROFLOXACIN <=0.25 SENSITIVE Sensitive     GENTAMICIN <=1 SENSITIVE Sensitive     IMIPENEM >=16 RESISTANT Resistant  PIP/TAZO 8 SENSITIVE Sensitive     TOBRAMYCIN* <=1 SENSITIVE Sensitive      * SET UP TIME:  [id number]    * MODERATE PSEUDOMONAS AERUGINOSA    Coagulation Studies:  Recent Labs  06/10/14 0524  LABPROT 14.8  INR 1.14    Urinalysis:  Recent Labs Lab 06/09/14 1833  COLORURINE STRAW*  LABSPEC 1.010  PHURINE 7.0  GLUCOSEU NEGATIVE  HGBUR NEGATIVE  BILIRUBINUR NEGATIVE  KETONESUR NEGATIVE  PROTEINUR NEGATIVE  UROBILINOGEN 0.2  NITRITE NEGATIVE  LEUKOCYTESUR NEGATIVE    Lipid Panel:     Component Value Date/Time   CHOL 106 01/17/2014 0340   TRIG 85 01/17/2014 0340   HDL 34* 01/17/2014 0340   CHOLHDL 3.1 01/17/2014 0340   VLDL 17 01/17/2014 0340   LDLCALC 55 01/17/2014 0340    HgbA1C: No results found for: HGBA1C  Urine Drug Screen:     Component Value Date/Time   LABOPIA NONE DETECTED 06/09/2014 1833   LABOPIA NEGATIVE 07/06/2013 2352   COCAINSCRNUR NONE DETECTED 06/09/2014 1833   COCAINSCRNUR NEGATIVE 07/06/2013 2352   LABBENZ NONE DETECTED 06/09/2014 1833   LABBENZ POSITIVE* 07/06/2013 2352   AMPHETMU NONE DETECTED 06/09/2014 1833   AMPHETMU NEGATIVE 07/06/2013 2352   THCU POSITIVE* 06/09/2014 1833   LABBARB NONE DETECTED 06/09/2014 1833    Alcohol Level:  Recent Labs Lab 06/06/14 2313 06/09/14 1705  ETH <5 <5    Other results: EKG: normal sinus rhythm at 75 bpm.  Imaging: Dg Chest 2 View  06/09/2014   CLINICAL DATA:  Seizure today, history asthma, hypertension, smoking, GERD, alcohol abuse, schizophrenia,  pancreatitis, coronary artery disease  EXAM: CHEST  2 VIEW  COMPARISON:  06/06/2014  FINDINGS: Normal heart size, mediastinal contours, and pulmonary vascularity.  Lungs hyperinflated but clear.  No pleural effusion or pneumothorax.  Bones demineralized.  IMPRESSION: Hyperinflation without acute infiltrate.   Electronically Signed   By: Lavonia Dana M.D.   On: 06/09/2014 18:01   Ct Head Wo Contrast  06/09/2014   CLINICAL DATA:  Unwitnessed seizure at home. Found postictal. History of seizures.  EXAM: CT HEAD WITHOUT CONTRAST  TECHNIQUE: Contiguous axial images were obtained from the base of the skull through the vertex without intravenous contrast.  COMPARISON:  04/11/2014.  FINDINGS: Premature for age cerebral and cerebellar atrophy. This affects the temporal lobes particularly. No visible acute stroke, hemorrhage, intra-axial mass lesion, inflammatory process, or extra-axial fluid. Calvarium intact. LEFT maxillary sinus acute air-fluid level with foamy secretions. Negative orbits. No mastoid fluid. Similar appearance to priors.  IMPRESSION: Premature atrophy. No acute or focal intracranial abnormality. No interval change most recent priors.  Acute LEFT maxillary sinusitis.   Electronically Signed   By: Rolla Flatten M.D.   On: 06/09/2014 18:07     Assessment/Plan: 49 year old male with a history of seizures and alcoholic pancreatitis who presents after being found down.  Patient reports that he had a seizure.  Reports that he was compliant with medications.  This is likely the case at least to some degree based on levels done on admission.  Depakote may be prescribed for psych reasons.  Level 12.  Due to pancreatitis would not increase Depakote at this time.  May benefit from an increase in the [place].    Recommendations: 1.  Continue Depakote at home dose 2.  Increase Keppra to 1250mg  BID.  May continue po.   3.  Continue seizure precautions 4.  Ativan prn  Alexis Goodell, MD [facility] [phone]  06/10/2014, 10:43 AM

## 2014-06-11 DIAGNOSIS — K852 Alcohol induced acute pancreatitis: Secondary | ICD-10-CM

## 2014-06-11 DIAGNOSIS — I10 Essential (primary) hypertension: Secondary | ICD-10-CM

## 2014-06-11 DIAGNOSIS — I229 Subsequent ST elevation (STEMI) myocardial infarction of unspecified site: Secondary | ICD-10-CM

## 2014-06-11 DIAGNOSIS — I214 Non-ST elevation (NSTEMI) myocardial infarction: Secondary | ICD-10-CM

## 2014-06-11 DIAGNOSIS — IMO0002 Reserved for concepts with insufficient information to code with codable children: Secondary | ICD-10-CM | POA: Diagnosis present

## 2014-06-11 DIAGNOSIS — J0100 Acute maxillary sinusitis, unspecified: Secondary | ICD-10-CM | POA: Diagnosis present

## 2014-06-11 LAB — CBC
HEMATOCRIT: 33.7 % — AB (ref 39.0–52.0)
Hemoglobin: 11.3 g/dL — ABNORMAL LOW (ref 13.0–17.0)
MCH: 31.1 pg (ref 26.0–34.0)
MCHC: 33.5 g/dL (ref 30.0–36.0)
MCV: 92.8 fL (ref 78.0–100.0)
Platelets: 250 10*3/uL (ref 150–400)
RBC: 3.63 MIL/uL — ABNORMAL LOW (ref 4.22–5.81)
RDW: 14 % (ref 11.5–15.5)
WBC: 4.8 10*3/uL (ref 4.0–10.5)

## 2014-06-11 LAB — BASIC METABOLIC PANEL
ANION GAP: 8 (ref 5–15)
BUN: 6 mg/dL (ref 6–20)
CO2: 26 mmol/L (ref 22–32)
Calcium: 8.5 mg/dL — ABNORMAL LOW (ref 8.9–10.3)
Chloride: 109 mmol/L (ref 101–111)
Creatinine, Ser: 0.52 mg/dL — ABNORMAL LOW (ref 0.61–1.24)
GFR calc Af Amer: >60 mL/min (ref >60)
GFR calc non Af Amer: >60 mL/min (ref >60)
Glucose, Bld: 76 mg/dL (ref 65–99)
Potassium: 4.5 mmol/L (ref 3.5–5.1)
Sodium: 143 mmol/L (ref 135–145)

## 2014-06-11 LAB — HEPATITIS PANEL, ACUTE
HCV Ab: <0.1 s/co ratio (ref 0.0–0.9)
HEP A IGM: NEGATIVE
Hep B C IgM: NEGATIVE
Hepatitis B Surface Ag: NEGATIVE

## 2014-06-11 LAB — LIPID PANEL
Cholesterol: 80 mg/dL (ref 0–200)
HDL: 34 mg/dL — ABNORMAL LOW (ref >40)
LDL Cholesterol: 40 mg/dL (ref 0–99)
Total CHOL/HDL Ratio: 2.4 RATIO
Triglycerides: 29 mg/dL (ref <150)
VLDL: 6 mg/dL (ref 0–40)

## 2014-06-11 LAB — LIPASE, BLOOD: Lipase: 181 U/L — ABNORMAL HIGH (ref 22–51)

## 2014-06-11 LAB — TROPONIN I: Troponin I: 0.04 ng/mL — ABNORMAL HIGH (ref <0.031)

## 2014-06-11 MED ORDER — AMOXICILLIN-POT CLAVULANATE 875-125 MG PO TABS: 1.0000 | ORAL_TABLET | Freq: Two times a day (BID) | ORAL | Status: DC

## 2014-06-11 MED ORDER — LEVETIRACETAM 250 MG PO TABS: 1250.0000 mg | ORAL_TABLET | Freq: Two times a day (BID) | ORAL | Status: DC

## 2014-06-11 MED ORDER — THIAMINE HCL 100 MG PO TABS: 100.0000 mg | ORAL_TABLET | Freq: Every day | ORAL | Status: AC

## 2014-06-11 MED ORDER — GUAIFENESIN ER 600 MG PO TB12: 600.0000 mg | ORAL_TABLET | Freq: Two times a day (BID) | ORAL | Status: DC

## 2014-06-11 MED ORDER — ADULT MULTIVITAMIN W/MINERALS CH: 1.0000 | ORAL_TABLET | Freq: Every day | ORAL | Status: DC

## 2014-06-11 MED ORDER — FOLIC ACID 1 MG PO TABS: 1.0000 mg | ORAL_TABLET | Freq: Every day | ORAL | Status: AC

## 2014-06-11 MED ORDER — ENSURE ENLIVE PO LIQD: 237.0000 mL | Freq: Two times a day (BID) | ORAL | Status: AC

## 2014-06-11 MED ORDER — FLUTICASONE PROPIONATE 50 MCG/ACT NA SUSP: 2.0000 | Freq: Every day | NASAL | Status: DC

## 2014-06-11 NOTE — Progress Notes (Signed)
   06/11/14 1454  Clinical Encounter Type  Visited With Patient;Other (Comment) Freight forwarder)  Visit Type Initial;Spiritual support;Social support;Psychological support  Referral From Nurse  Consult/Referral To Chaplain  Spiritual Encounters  Spiritual Needs Emotional  Met with patient; sitter in room; Patient talks very slowly and has bug-eyed look; Perceived overall lack of cognition and patient seemed not to be present.

## 2014-06-11 NOTE — Progress Notes (Signed)
Patient is stable and has no complaints at this time, patient was able to tolerate full liquid diet and continues to request supplemental nutritional drinks.  Patient is requesting to be discharged, patient instructed that the MD will make the decision if he is able to be discharged or not, will continue to monitor the patient. D.Makhiya Coburn RN

## 2014-06-11 NOTE — Progress Notes (Signed)
Subjective:  Sleepy denies any complaints. Cardiac enzymes minimally elevated and trending down  Objective:  Vital Signs in the last 24 hours: Temp:  [97.5 F (36.4 C)-98 F (36.7 C)] 97.6 F (36.4 C) (06/07 0535) Pulse Rate:  [51-79] 56 (06/07 0535) Resp:  [16-18] 18 (06/07 0535) BP: (99-118)/(58-73) 99/66 mmHg (06/07 0535) SpO2:  [97 %-100 %] 97 % (06/07 0535) Weight:  [57.288 kg (126 lb 4.8 oz)] 57.288 kg (126 lb 4.8 oz) (06/07 0535)  Intake/Output from previous day: 06/06 0701 - 06/07 0700 In: 3209.3 [P.O.:1676; I.V.:1533.3] Out: 2675 [Urine:2675] Intake/Output from this shift:    Physical Exam: Neck: no adenopathy, no carotid bruit, no JVD and supple, symmetrical, trachea midline Lungs: clear to auscultation bilaterally Heart: regular rate and rhythm, S1, S2 normal and Soft systolic murmur noted Abdomen: soft, non-tender; bowel sounds normal; no masses,  no organomegaly Extremities: extremities normal, atraumatic, no cyanosis or edema  Lab Results:  Recent Labs  06/10/14 0524 06/11/14 0405  WBC 5.6 4.8  HGB 12.0* 11.3*  PLT 262 250    Recent Labs  06/10/14 0524 06/11/14 0405  NA 142 143  K 3.1* 4.5  CL 107 109  CO2 27 26  GLUCOSE 130* 76  BUN 6 6  CREATININE 0.53* 0.52*    Recent Labs  06/10/14 1051 06/11/14 0405  TROPONINI 0.06* 0.04*   Hepatic Function Panel  Recent Labs  06/10/14 0524  PROT 5.9*  ALBUMIN 2.8*  AST 18  ALT 13*  ALKPHOS 44  BILITOT 0.3    Recent Labs  06/11/14 0405  CHOL 80   No results for input(s): PROTIME in the last 72 hours.  Imaging: Imaging results have been reviewed and Dg Chest 2 View  06/09/2014   CLINICAL DATA:  Seizure today, history asthma, hypertension, smoking, GERD, alcohol abuse, schizophrenia, pancreatitis, coronary artery disease  EXAM: CHEST  2 VIEW  COMPARISON:  06/06/2014  FINDINGS: Normal heart size, mediastinal contours, and pulmonary vascularity.  Lungs hyperinflated but clear.  No pleural  effusion or pneumothorax.  Bones demineralized.  IMPRESSION: Hyperinflation without acute infiltrate.   Electronically Signed   By: Lavonia Dana M.D.   On: 06/09/2014 18:01   Ct Head Wo Contrast  06/09/2014   CLINICAL DATA:  Unwitnessed seizure at home. Found postictal. History of seizures.  EXAM: CT HEAD WITHOUT CONTRAST  TECHNIQUE: Contiguous axial images were obtained from the base of the skull through the vertex without intravenous contrast.  COMPARISON:  04/11/2014.  FINDINGS: Premature for age cerebral and cerebellar atrophy. This affects the temporal lobes particularly. No visible acute stroke, hemorrhage, intra-axial mass lesion, inflammatory process, or extra-axial fluid. Calvarium intact. LEFT maxillary sinus acute air-fluid level with foamy secretions. Negative orbits. No mastoid fluid. Similar appearance to priors.  IMPRESSION: Premature atrophy. No acute or focal intracranial abnormality. No interval change most recent priors.  Acute LEFT maxillary sinusitis.   Electronically Signed   By: Rolla Flatten M.D.   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

## 2014-06-11 NOTE — Discharge Summary (Signed)
Physician Discharge Summary  Chase Blackwell BTD:[id number] DOB: Sep 02, 1965 DOA: 06/09/2014  PCP: [facility]  Admit date: 06/09/2014 Discharge date: 06/11/2014  Time spent: 65 minutes  Recommendations for Outpatient Follow-up:  1. Follow-up with [facility] in 1 week. 2. Follow-up with Dr. Terrence Blackwell of cardiology in 2 weeks.  Discharge Diagnoses:  Principal Problem:   Seizure disorder Active Problems:   Acute non-Q-wave MI <8 weeks prior, subsequent admission after initial   Alcohol abuse   Essential hypertension   Acute pancreatitis   Tobacco abuse   CAD (coronary artery disease) of artery bypass graft   Chest pain   Schizophrenia   Hyponatremia   Sinusitis, acute maxillary   Maxillary sinusitis, acute   Discharge Condition: Stable and improved  Diet recommendation: Regular  Filed Weights   06/09/14 2321 06/11/14 0535  Weight: 57.425 kg (126 lb 9.6 oz) 57.288 kg (126 lb 4.8 oz)    History of present illness:  Per Dr. Corinna Capra Blackwell is a 49 y.o. male   has a past medical history of Hypertension; Asthma; GERD (gastroesophageal reflux disease); Chronic pain; Schizophrenia; Alcohol abuse; Seizures; Pancreatitis; Depression; and CAD (coronary artery disease).   Patient is a poor historian likely secondary to history of schizophrenia. Apparently he was recently admitted for chest pain evaluation but left AMA. On day of admission, patient was brought by EMS due to seizure activity. Seizure activity stopped spontaneously. Patient has history of seizures supposed to be on Keppra and Depakote states not sure he was taking it. Has history of alcohol abuse but again patient is unclear when was his last alcohol intake. Alcohol level is undetectable. Patient was placed on CIWA protocol in the emergency department. CT scan of the head showing no evidence of intracranial bleeding but atrophy noted.  Patient with history of coronary artery disease status post stenting in  2015. He was supposed to be on Plavix but unsure if he is actually being compliant.. Patient lives together with Chase Blackwell who is his brother phone number [phone]  Hospitalist was called for admission for seizure disorder  Hospital Course:  #1 seizures Patient was admitted with seizures. CT head which was obtained was negative for any acute abnormalities. Felt maybe secondary to medical noncompliance. Patient however stated he's been compliant with his medications. CT head negative. Patient noted to be hypokalemic which was repleted. Patient's magnesium level was at 2. Patient was placed back on Keppra and Depakote and neurology consultation was obtained. He was recommended per neurology due to patient's acute pancreatitis Depakote dose was not changed however patient's Keppra dose was increased to 1250 mg twice daily. Patient did not have any further seizure episodes during the hospitalization patient be discharged in stable and improved condition.   #2 chest pain/coronary artery disease Patient left the hospital AMA during his recent prior hospitalization without full workup for his chest pain. Cardiac enzymes are minimally elevated and seemed to be trending down. 2-D echo was obtained which had a E EF of 55-60% with no wall motion abnormalities and no significant changes compared to prior echo in 2015. Patient was seen in consultation by his cardiologist Dr. Terrence Blackwell, who felt patient may have probably had a recent very small non-Q-wave MI. It was recommended to continue medical management and outpatient follow-up.   #3 acute pancreatitis Patient was admitted with acute on chronic pancreatitis. Patient's alcohol level was less than 5. Patient had presented with some epigastric abdominal pain and elevated lipase levels. Patient was made  nothing by mouth. Patient improved clinically and subsequently started on clear liquids which was advanced to a soft diet which she tolerated. Patient's acute  pancreatitis improved clinically and had resolved by day of discharge.   #4 Alcohol Abuse Patient was placed on the Ativan withdrawal protocol, thiamine and folic acid.  #5 hypokalemia Repleted.  #6 hypertension Stable. Patient's metoprolol was held initially. Patient was maintained on IV Lopressor secondary to presentation with acute pancreatitis. Once patient was tolerating oral intake is oral metoprolol was resumed.   #7 schizophrenia Continued on Depakote at his home dose. Continued on his home regimen of Cogentin and Seroquel. Patient remained stable during the hospitalization patient will follow-up with psychiatry as outpatient.   #8 acute maxillary sinusitis Patient was placed on Augmentin during the hospitalization.   Procedures:  CT head 06/09/2014  Chest x-ray 06/09/2014  2-D echo 06/10/2014  Consultations:  Neurology: Dr. Doy Blackwell 06/10/2014  Cardiology: Dr. Terrence Blackwell  Discharge Exam: Filed Vitals:   06/11/14 1403  BP: 92/61  Pulse: 73  Temp: 98.4 F (36.9 C)  Resp: 20    General: NAD Cardiovascular: RRR Respiratory: CTAB  Discharge Instructions   Discharge Instructions    Diet - low sodium heart healthy    Complete by:  As directed      Discharge instructions    Complete by:  As directed   Follow up with [facility] in 1 week. Follow up with Dr Chase Blackwell, cardiology in 2 weeks.     Increase activity slowly    Complete by:  As directed           Current Discharge Medication List    START taking these medications   Details  amoxicillin-clavulanate (AUGMENTIN) 875-125 MG per tablet Take 1 tablet by mouth every 12 (twelve) hours. Take for 6 days then stop. Qty: 12 tablet, Refills: 0    feeding supplement, ENSURE ENLIVE, (ENSURE ENLIVE) LIQD Take 237 mLs by mouth 2 (two) times daily between meals. Qty: 237 mL, Refills: 12    fluticasone (FLONASE) 50 MCG/ACT nasal spray Place 2 sprays into both nostrils daily. Use for 1 week, then as  needed. Qty: 16 g, Refills: 0    folic acid (FOLVITE) 1 MG tablet Take 1 tablet (1 mg total) by mouth daily.    guaiFENesin (MUCINEX) 600 MG 12 hr tablet Take 1 tablet (600 mg total) by mouth 2 (two) times daily. Take for 6 days then stop. Qty: 12 tablet, Refills: 0    Multiple Vitamin (MULTIVITAMIN WITH MINERALS) TABS tablet Take 1 tablet by mouth daily.    thiamine 100 MG tablet Take 1 tablet (100 mg total) by mouth daily.      CONTINUE these medications which have CHANGED   Details  levETIRAcetam (KEPPRA) 250 MG tablet Take 5 tablets (1,250 mg total) by mouth 2 (two) times daily. Qty: 300 tablet, Refills: 0      CONTINUE these medications which have NOT CHANGED   Details  atorvastatin (LIPITOR) 80 MG tablet Take 1 tablet (80 mg total) by mouth daily. Qty: 30 tablet, Refills: 0    benztropine (COGENTIN) 0.5 MG tablet Take 0.5 mg by mouth 2 (two) times daily.    clopidogrel (PLAVIX) 75 MG tablet Take 1 tablet (75 mg total) by mouth daily. Qty: 30 tablet, Refills: 0    divalproex (DEPAKOTE) 500 MG DR tablet Take 1 tablet (500 mg total) by mouth 2 (two) times daily. Qty: 60 tablet, Refills: 0    famotidine (PEPCID) 20  MG tablet Take 1 tablet (20 mg total) by mouth 2 (two) times daily. Qty: 60 tablet, Refills: 0    furosemide (LASIX) 40 MG tablet Take 1 tablet (40 mg total) by mouth daily. Qty: 30 tablet, Refills: 0    haloperidol decanoate (HALDOL DECANOATE) 100 MG/ML injection Inject 1 mL into the muscle every 28 (twenty-eight) days.    hydrALAZINE (APRESOLINE) 10 MG tablet Take 1 tablet (10 mg total) by mouth 2 (two) times daily. Qty: 60 tablet, Refills: 0    lisinopril (ZESTRIL) 2.5 MG tablet Take 1 tablet (2.5 mg total) by mouth daily. Qty: 30 tablet, Refills: 0    LORazepam (ATIVAN) 0.5 MG tablet Take 0.5 mg by mouth at bedtime as needed for anxiety.    meloxicam (MOBIC) 15 MG tablet Take 1 tablet (15 mg total) by mouth daily. Qty: 20 tablet, Refills: 0     metoprolol tartrate (LOPRESSOR) 25 MG tablet Take 0.5 tablets (12.5 mg total) by mouth 2 (two) times daily. Qty: 30 tablet, Refills: 0    oxyCODONE (ROXICODONE) 5 MG immediate release tablet Take one tablet by mouth twice daily for pain Qty: 60 tablet, Refills: 0    QUEtiapine (SEROQUEL) 50 MG tablet Take 1 tablet (50 mg total) by mouth at bedtime. Qty: 30 tablet, Refills: 0    traZODone (DESYREL) 100 MG tablet Take 1 tablet by mouth at bedtime as needed for sleep.  Refills: 2    albuterol (PROVENTIL HFA;VENTOLIN HFA) 108 (90 BASE) MCG/ACT inhaler Inhale 2 puffs into the lungs every 4 (four) hours as needed for wheezing or shortness of breath. Qty: 1 Inhaler, Refills: 3    aspirin 81 MG chewable tablet Chew 1 tablet (81 mg total) by mouth daily. Qty: 30 tablet, Refills: 1       Allergies  Allergen Reactions  . Hctz [Hydrochlorothiazide] Other (See Comments)    Dizzy spells  . Hydroxyzine Hives  . Sulfonamide Derivatives Hives  . Cetirizine & Related Other (See Comments)    unknown   Follow-up Information    Follow up with [facility]. Schedule an appointment as soon as possible for a visit in 1 week.   Specialty:  Internal Medicine   Contact information:   [address] [place] [address] [phone]       Follow up with Charolette Forward, MD. Schedule an appointment as soon as possible for a visit in 2 weeks.   Specialty:  Cardiology   Why:  f/u in 2 weeks   Contact information:   [address] [place] [address] [phone]        The results of significant diagnostics from this hospitalization (including imaging, microbiology, ancillary and laboratory) are listed below for reference.    Significant Diagnostic Studies: Dg Chest 2 View  06/09/2014   CLINICAL DATA:  Seizure today, history asthma, hypertension, smoking, GERD, alcohol abuse, schizophrenia, pancreatitis, coronary artery disease  EXAM: CHEST  2 VIEW  COMPARISON:   06/06/2014  FINDINGS: Normal heart size, mediastinal contours, and pulmonary vascularity.  Lungs hyperinflated but clear.  No pleural effusion or pneumothorax.  Bones demineralized.  IMPRESSION: Hyperinflation without acute infiltrate.   Electronically Signed   By: Lavonia Dana M.D.   On: 06/09/2014 18:01   Dg Chest 2 View  06/06/2014   CLINICAL DATA:  Central chest pain with shortness of breath and nausea since this evening.  EXAM: CHEST  2 VIEW  COMPARISON:  05/01/2014  FINDINGS: Lungs are hyperinflated, unchanged. The  cardiomediastinal contours are normal. Pulmonary vasculature is normal. No consolidation, pleural effusion, or pneumothorax. No acute osseous abnormalities are seen.  IMPRESSION: No acute pulmonary process.   Electronically Signed   By: Jeb Levering M.D.   On: 06/06/2014 22:32   Ct Head Wo Contrast  06/09/2014   CLINICAL DATA:  Unwitnessed seizure at home. Found postictal. History of seizures.  EXAM: CT HEAD WITHOUT CONTRAST  TECHNIQUE: Contiguous axial images were obtained from the base of the skull through the vertex without intravenous contrast.  COMPARISON:  04/11/2014.  FINDINGS: Premature for age cerebral and cerebellar atrophy. This affects the temporal lobes particularly. No visible acute stroke, hemorrhage, intra-axial mass lesion, inflammatory process, or extra-axial fluid. Calvarium intact. LEFT maxillary sinus acute air-fluid level with foamy secretions. Negative orbits. No mastoid fluid. Similar appearance to priors.  IMPRESSION: Premature atrophy. No acute or focal intracranial abnormality. No interval change most recent priors.  Acute LEFT maxillary sinusitis.   Electronically Signed   By: Rolla Flatten M.D.   On: 06/09/2014 18:07   Dg Chest Portable 1 View  06/06/2014   CLINICAL DATA:  Chest pain and shortness of breath for 1 day.  EXAM: PORTABLE CHEST - 1 VIEW  COMPARISON:  Earlier this day at 2233 hour  FINDINGS: The cardiomediastinal contours are normal. Unchanged  hyperinflation. Pulmonary vasculature is normal. No consolidation, pleural effusion, or pneumothorax. No acute osseous abnormalities are seen.  IMPRESSION: No change from prior exam.   Electronically Signed   By: Jeb Levering M.D.   On: 06/06/2014 23:26    Microbiology: No results found for this or any previous visit (from the past 240 hour(s)).   Labs: Basic Metabolic Panel:  Recent Labs Lab 06/06/14 2206 06/07/14 0715 06/09/14 1705 06/09/14 2330 06/10/14 0524 06/11/14 0405  NA 137 142 130*  --  142 143  K 3.6 4.0 3.3*  --  3.1* 4.5  CL 101 109 95*  --  107 109  CO2 26 26 25   --  27 26  GLUCOSE 97 105* 149*  --  130* 76  BUN 7 8 6   --  6 6  CREATININE 0.74 0.67 0.57*  --  0.53* 0.52*  CALCIUM 8.9 8.3* 8.5*  --  8.6* 8.5*  MG  --   --   --  2.0 2.0  --   PHOS  --   --   --  3.6 3.0  --    Liver Function Tests:  Recent Labs Lab 06/06/14 2313 06/09/14 1705 06/10/14 0524  AST 13* 15 18  ALT 10* 12* 13*  ALKPHOS 47 48 44  BILITOT 0.6 0.3 0.3  PROT 6.1* 6.5 5.9*  ALBUMIN 3.3* 3.4* 2.8*    Recent Labs Lab 06/06/14 2313 06/09/14 1705 06/10/14 1051 06/11/14 0405  LIPASE 177* 257* 316* 181*    Recent Labs Lab 06/09/14 1705  AMMONIA 19   CBC:  Recent Labs Lab 06/06/14 2206 06/07/14 0715 06/09/14 1705 06/10/14 0524 06/11/14 0405  WBC 8.4 6.3 7.4 5.6 4.8  NEUTROABS  --   --  5.6  --   --   HGB 12.7* 11.2* 12.0* 12.0* 11.3*  HCT 36.9* 33.9* 34.8* 34.7* 33.7*  MCV 90.9 92.4 91.1 90.4 92.8  PLT 246 194 237 262 250   Cardiac Enzymes:  Recent Labs Lab 06/09/14 1705 06/09/14 2330 06/10/14 0524 06/10/14 1051 06/11/14 0405  CKTOTAL 105  --   --   --   --   TROPONINI <0.03 0.15* 0.10* 0.06*  0.04*   BNP: BNP (last 3 results)  Recent Labs  05/01/14 2153 06/06/14 2206  BNP 21.0 28.5    ProBNP (last 3 results)  Recent Labs  06/27/13 1933  PROBNP 146.2*    CBG:  Recent Labs Lab 06/07/14 0632  GLUCAP 112*        Signed:  THOMPSON,DANIEL MD [facility] 06/11/2014, 2:28 PM

## 2014-06-11 NOTE — Progress Notes (Signed)
Subjective: No further seizures overnight. Wanting to go home.   Objective: Current vital signs: BP 106/68 mmHg  Pulse 78  Temp(Src) 97.6 F (36.4 C) (Oral)  Resp 18  Ht 6\' 1"  (1.854 m)  Wt 57.288 kg (126 lb 4.8 oz)  BMI 16.67 kg/m2  SpO2 97% Vital signs in last 24 hours: Temp:  [97.5 F (36.4 C)-98 F (36.7 C)] 97.6 F (36.4 C) (06/07 0535) Pulse Rate:  [51-79] 78 (06/07 0941) Resp:  [16-18] 18 (06/07 0535) BP: (99-118)/(58-73) 106/68 mmHg (06/07 0941) SpO2:  [97 %-100 %] 97 % (06/07 0535) Weight:  [57.288 kg (126 lb 4.8 oz)] 57.288 kg (126 lb 4.8 oz) (06/07 0535)  Intake/Output from previous day: 06/06 0701 - 06/07 0700 In: 3209.3 [P.O.:1676; I.V.:1533.3] Out: 2675 [Urine:2675] Intake/Output this shift: Total I/O In: 720 [P.O.:720] Out: 400 [Urine:400] Nutritional status: DIET SOFT Room service appropriate?: Yes; Fluid consistency:: Thin  Neurologic Exam: General: Mental Status: Alert, oriented to hospital, year, month.   Speech fluent without evidence of aphasia.  Able to follow 3 step commands without difficulty. Cranial Nerves: II: Visual fields grossly normal, pupils equal, round, reactive to light and accommodation III,IV, VI: ptosis not present, extra-ocular motions intact bilaterally V,VII: smile symmetric, facial light touch sensation normal bilaterally VIII: hearing normal bilaterally IX,X: uvula rises symmetrically XI: bilateral shoulder shrug XII: midline tongue extension without atrophy or fasciculations  Motor: Right : Upper extremity   5/5    Left:     Upper extremity   5/5  Lower extremity   5/5     Lower extremity   5/5 Tone and bulk:normal tone throughout; no atrophy noted Sensory: Pinprick and light touch intact throughout, bilaterally Deep Tendon Reflexes:  Right: Upper Extremity   Left: Upper extremity   biceps (C-5 to C-6) 2/4   biceps (C-5 to C-6) 2/4 tricep (C7) 2/4    triceps (C7) 2/4 Brachioradialis (C6) 2/4  Brachioradialis (C6)  2/4  Lower Extremity Lower Extremity  quadriceps (L-2 to L-4) 2/4   quadriceps (L-2 to L-4) 2/4 Achilles (S1) 2/4   Achilles (S1) 2/4  Plantars: Right: equivical   Left: downgoing Cerebellar: normal finger-to-nose,  normal heel-to-shin test    Lab Results: Basic Metabolic Panel:  Recent Labs Lab 06/06/14 2206 06/07/14 0715 06/09/14 1705 06/09/14 2330 06/10/14 0524 06/11/14 0405  NA 137 142 130*  --  142 143  K 3.6 4.0 3.3*  --  3.1* 4.5  CL 101 109 95*  --  107 109  CO2 26 26 25   --  27 26  GLUCOSE 97 105* 149*  --  130* 76  BUN 7 8 6   --  6 6  CREATININE 0.74 0.67 0.57*  --  0.53* 0.52*  CALCIUM 8.9 8.3* 8.5*  --  8.6* 8.5*  MG  --   --   --  2.0 2.0  --   PHOS  --   --   --  3.6 3.0  --     Liver Function Tests:  Recent Labs Lab 06/06/14 2313 06/09/14 1705 06/10/14 0524  AST 13* 15 18  ALT 10* 12* 13*  ALKPHOS 47 48 44  BILITOT 0.6 0.3 0.3  PROT 6.1* 6.5 5.9*  ALBUMIN 3.3* 3.4* 2.8*    Recent Labs Lab 06/06/14 2313 06/09/14 1705 06/10/14 1051 06/11/14 0405  LIPASE 177* 257* 316* 181*    Recent Labs Lab 06/09/14 1705  AMMONIA 19    CBC:  Recent Labs Lab 06/06/14 2206 06/07/14 0715  06/09/14 1705 06/10/14 0524 06/11/14 0405  WBC 8.4 6.3 7.4 5.6 4.8  NEUTROABS  --   --  5.6  --   --   HGB 12.7* 11.2* 12.0* 12.0* 11.3*  HCT 36.9* 33.9* 34.8* 34.7* 33.7*  MCV 90.9 92.4 91.1 90.4 92.8  PLT 246 194 237 262 250    Cardiac Enzymes:  Recent Labs Lab 06/09/14 1705 06/09/14 2330 06/10/14 0524 06/10/14 1051 06/11/14 0405  CKTOTAL 105  --   --   --   --   TROPONINI <0.03 0.15* 0.10* 0.06* 0.04*    Lipid Panel:  Recent Labs Lab 06/11/14 0405  CHOL 80  TRIG 29  HDL 34*  CHOLHDL 2.4  VLDL 6  LDLCALC 40    CBG:  Recent Labs Lab 06/07/14 0632  GLUCAP 112*    Microbiology: Results for orders placed or performed during the hospital encounter of 07/06/13  MRSA PCR Screening     Status: None   Collection Time: 07/06/13  11:38 PM  Result Value Ref Range Status   MRSA by PCR NEGATIVE NEGATIVE Final    Comment:        The GeneXpert MRSA Assay (FDA approved for NASAL specimens only), is one component of a comprehensive MRSA colonization surveillance program. It is not intended to diagnose MRSA infection nor to guide or monitor treatment for MRSA infections.  Urine culture     Status: None   Collection Time: 07/06/13 11:52 PM  Result Value Ref Range Status   Specimen Description URINE, CATHETERIZED  Final   Special Requests NONE  Final   Culture  Setup Time   Final    07/07/2013 12:52 Performed at [place] Performed at [facility]  Final   Culture NO GROWTH Performed at [facility]  Final   Report Status 07/08/2013 FINAL  Final  Culture, respiratory (NON-Expectorated)     Status: None   Collection Time: 07/09/13 11:40 AM  Result Value Ref Range Status   Specimen Description TRACHEAL ASPIRATE  Final   Special Requests NONE  Final   Gram Stain   Final    ABUNDANT WBC PRESENT,BOTH PMN AND MONONUCLEAR NO SQUAMOUS EPITHELIAL CELLS SEEN ABUNDANT GRAM NEGATIVE RODS RARE GRAM POSITIVE COCCI IN PAIRS   Culture   Final    ABUNDANT KLEBSIELLA PNEUMONIAE Performed at [facility]   Report Status 07/11/2013 FINAL  Final   Organism ID, Bacteria KLEBSIELLA PNEUMONIAE  Final      Susceptibility   Klebsiella pneumoniae - MIC*    AMPICILLIN RESISTANT      AMPICILLIN/SULBACTAM 4 SENSITIVE Sensitive     CEFAZOLIN <=4 SENSITIVE Sensitive     CEFEPIME <=1 SENSITIVE Sensitive     CEFTAZIDIME <=1 SENSITIVE Sensitive     CEFTRIAXONE <=1 SENSITIVE Sensitive     CIPROFLOXACIN <=0.25 SENSITIVE Sensitive     GENTAMICIN <=1 SENSITIVE Sensitive     IMIPENEM <=0.25 SENSITIVE Sensitive     PIP/TAZO <=4 SENSITIVE Sensitive     TOBRAMYCIN <=1 SENSITIVE Sensitive     TRIMETH/SULFA <=20 SENSITIVE Sensitive     * ABUNDANT KLEBSIELLA PNEUMONIAE  Culture,  blood (routine x 2)     Status: None   Collection Time: 07/09/13  1:10 PM  Result Value Ref Range Status   Specimen Description BLOOD LEFT HAND  Final   Special Requests BOTTLES DRAWN AEROBIC ONLY [place]  Final   Culture  Setup Time   Final  07/09/2013 18:46 Performed at [place] (COAGULASE NEGATIVE) Note: THE SIGNIFICANCE OF ISOLATING THIS ORGANISM FROM A SINGLE SET OF BLOOD CULTURES WHEN MULTIPLE SETS ARE DRAWN IS UNCERTAIN. PLEASE NOTIFY THE MICROBIOLOGY DEPARTMENT WITHIN ONE WEEK IF SPECIATION AND SENSITIVITIES ARE REQUIRED. Note: Gram Stain Report Called to,Read Back By and Verified With: TONYA S@12 :08PM ON 07/10/13 BY DANTS Performed at [facility]   Report Status 07/11/2013 FINAL  Final  Culture, blood (routine x 2)     Status: None   Collection Time: 07/09/13  1:20 PM  Result Value Ref Range Status   Specimen Description BLOOD RIGHT HAND  Final   Special Requests BOTTLES DRAWN AEROBIC ONLY [facility]  Final   Culture  Setup Time   Final    07/09/2013 18:46 Performed at [facility]   Culture   Final    NO GROWTH 5 DAYS Performed at [facility]   Report Status 07/15/2013 FINAL  Final  Culture, blood (routine x 2)     Status: None   Collection Time: 07/23/13 12:55 AM  Result Value Ref Range Status   Specimen Description BLOOD RIGHT ARM  Final   Special Requests BOTTLES DRAWN AEROBIC ONLY [facility]  Final   Culture  Setup Time   Final    07/23/2013 09:02 Performed at [facility]   Culture   Final    NO GROWTH 5 DAYS Performed at [facility]   Report Status 07/29/2013 FINAL  Final  Culture, blood (routine x 2)     Status: None   Collection Time: 07/23/13  1:00 AM  Result Value Ref Range Status   Specimen Description BLOOD RIGHT FOREARM  Final   Special Requests BOTTLES DRAWN AEROBIC ONLY [facility]  Final   Culture  Setup Time   Final    07/23/2013 09:03 Performed at [facility]    Culture   Final    NO GROWTH 5 DAYS Performed at [facility]   Report Status 07/29/2013 FINAL  Final  Urine culture     Status: None   Collection Time: 07/23/13  1:27 AM  Result Value Ref Range Status   Specimen Description URINE, CATHETERIZED  Final   Special Requests Normal  Final   Culture  Setup Time   Final    07/23/2013 01:49 Performed at [place] Performed at [facility]  Final   Culture NO GROWTH Performed at [facility]  Final   Report Status 07/24/2013 FINAL  Final  Culture, respiratory (NON-Expectorated)     Status: None   Collection Time: 07/23/13  3:09 AM  Result Value Ref Range Status   Specimen Description TRACHEAL ASPIRATE  Final   Special Requests NONE  Final   Gram Stain   Final    RARE WBC PRESENT, PREDOMINANTLY MONONUCLEAR RARE SQUAMOUS EPITHELIAL CELLS PRESENT FEW GRAM NEGATIVE RODS RARE GRAM POSITIVE COCCI IN PAIRS Performed at [facility]   Culture   Final    MODERATE PSEUDOMONAS AERUGINOSA MODERATE KLEBSIELLA PNEUMONIAE Performed at [facility]   Report Status 07/26/2013 FINAL  Final   Organism ID, Bacteria PSEUDOMONAS AERUGINOSA  Final   Organism ID, Bacteria KLEBSIELLA PNEUMONIAE  Final      Susceptibility   Klebsiella pneumoniae - MIC*    AMPICILLIN RESISTANT      AMPICILLIN/SULBACTAM 4 SENSITIVE Sensitive     CEFAZOLIN <=4  SENSITIVE Sensitive     CEFEPIME <=1 SENSITIVE Sensitive     CEFTAZIDIME <=1 SENSITIVE Sensitive     CEFTRIAXONE <=1 SENSITIVE Sensitive     CIPROFLOXACIN <=0.25 SENSITIVE Sensitive     GENTAMICIN <=1 SENSITIVE Sensitive     IMIPENEM <=0.25 SENSITIVE Sensitive     PIP/TAZO <=4 SENSITIVE Sensitive     TOBRAMYCIN <=1 SENSITIVE Sensitive     TRIMETH/SULFA <=20 SENSITIVE Sensitive     * MODERATE KLEBSIELLA PNEUMONIAE   Pseudomonas aeruginosa - MIC*    CEFEPIME 2 SENSITIVE Sensitive     CEFTAZIDIME 4 SENSITIVE Sensitive     CIPROFLOXACIN  <=0.25 SENSITIVE Sensitive     GENTAMICIN <=1 SENSITIVE Sensitive     IMIPENEM >=16 RESISTANT Resistant     PIP/TAZO 8 SENSITIVE Sensitive     TOBRAMYCIN* <=1 SENSITIVE Sensitive      * SET UP TIME:  [id number]    * MODERATE PSEUDOMONAS AERUGINOSA    Coagulation Studies:  Recent Labs  06/10/14 0524  LABPROT 14.8  INR 1.14    Imaging: Dg Chest 2 View  06/09/2014   CLINICAL DATA:  Seizure today, history asthma, hypertension, smoking, GERD, alcohol abuse, schizophrenia, pancreatitis, coronary artery disease  EXAM: CHEST  2 VIEW  COMPARISON:  06/06/2014  FINDINGS: Normal heart size, mediastinal contours, and pulmonary vascularity.  Lungs hyperinflated but clear.  No pleural effusion or pneumothorax.  Bones demineralized.  IMPRESSION: Hyperinflation without acute infiltrate.   Electronically Signed   By: Lavonia Dana M.D.   On: 06/09/2014 18:01   Ct Head Wo Contrast  06/09/2014   CLINICAL DATA:  Unwitnessed seizure at home. Found postictal. History of seizures.  EXAM: CT HEAD WITHOUT CONTRAST  TECHNIQUE: Contiguous axial images were obtained from the base of the skull through the vertex without intravenous contrast.  COMPARISON:  04/11/2014.  FINDINGS: Premature for age cerebral and cerebellar atrophy. This affects the temporal lobes particularly. No visible acute stroke, hemorrhage, intra-axial mass lesion, inflammatory process, or extra-axial fluid. Calvarium intact. LEFT maxillary sinus acute air-fluid level with foamy secretions. Negative orbits. No mastoid fluid. Similar appearance to priors.  IMPRESSION: Premature atrophy. No acute or focal intracranial abnormality. No interval change most recent priors.  Acute LEFT maxillary sinusitis.   Electronically Signed   By: Rolla Flatten M.D.   On: 06/09/2014 18:07    Medications:  Scheduled: . amoxicillin-clavulanate  1 tablet Oral Q12H  . antiseptic oral rinse  7 mL Mouth Rinse BID  . aspirin  81 mg Oral Daily  . atorvastatin  80 mg Oral QHS   . benztropine  0.5 mg Oral BID  . clopidogrel  75 mg Oral Daily  . divalproex  500 mg Oral BID  . feeding supplement (ENSURE ENLIVE)  237 mL Oral BID BM  . fluticasone  2 spray Each Nare Daily  . folic acid  1 mg Oral Daily  . guaiFENesin  600 mg Oral BID  . levETIRAcetam  1,250 mg Oral BID  . LORazepam  0-4 mg Intravenous Q6H   Followed by  . LORazepam  0-4 mg Intravenous Q12H  . metoprolol tartrate  12.5 mg Oral BID  . multivitamin with minerals  1 tablet Oral Daily  . pantoprazole (PROTONIX) IV  40 mg Intravenous Q12H  . QUEtiapine  50 mg Oral QHS  . sodium chloride  3 mL Intravenous Q12H  . thiamine  100 mg Oral Daily   Or  . thiamine  100 mg Intravenous Daily    Shanon Brow  [place] [facility] [phone]  06/11/2014, 10:40 AM   Patient seen and examined.  Clinical course and management discussed.  Necessary edits performed.  I agree with the above.  Assessment and plan of care developed and discussed below.  Assessment/Plan: 49 YO male with breakthrough seizure. No further seizures since admission.  Keppra currently at 1250 mg BID.  No SE from increased dose of Keppra noted.   Recommendations: 1.  Continue Keppra at current dose    Alexis Goodell, MD [facility] [phone]  06/11/2014  11:36 AM

## 2014-06-11 NOTE — Progress Notes (Signed)
Patient's IV and telemetry has been discontinue, centralized telemetry has been notified.  Attempted to call patient's family several times and no answer, the patient was very anxious and refuses to wait on family to pick him up, he was discharged and walked to visit his mother at nursing facility across the street.  Ruben Im RN

## 2014-06-16 ENCOUNTER — Emergency Department (HOSPITAL_COMMUNITY)
Admission: EM | Admit: 2014-06-16 | Discharge: 2014-06-16 | Disposition: A | Discharge status: Home / Self Care | Payer: Medicare Other | Attending: Emergency Medicine | Admitting: Emergency Medicine

## 2014-06-16 ENCOUNTER — Encounter (HOSPITAL_COMMUNITY): Payer: Self-pay | Admitting: *Deleted

## 2014-06-16 DIAGNOSIS — J45909 Unspecified asthma, uncomplicated: Secondary | ICD-10-CM | POA: Insufficient documentation

## 2014-06-16 DIAGNOSIS — S90822A Blister (nonthermal), left foot, initial encounter: Secondary | ICD-10-CM

## 2014-06-16 DIAGNOSIS — Z7951 Long term (current) use of inhaled steroids: Secondary | ICD-10-CM | POA: Diagnosis not present

## 2014-06-16 DIAGNOSIS — L988 Other specified disorders of the skin and subcutaneous tissue: Secondary | ICD-10-CM | POA: Insufficient documentation

## 2014-06-16 DIAGNOSIS — I10 Essential (primary) hypertension: Secondary | ICD-10-CM | POA: Insufficient documentation

## 2014-06-16 DIAGNOSIS — F209 Schizophrenia, unspecified: Secondary | ICD-10-CM | POA: Insufficient documentation

## 2014-06-16 DIAGNOSIS — Z7982 Long term (current) use of aspirin: Secondary | ICD-10-CM | POA: Insufficient documentation

## 2014-06-16 DIAGNOSIS — Z79899 Other long term (current) drug therapy: Secondary | ICD-10-CM | POA: Insufficient documentation

## 2014-06-16 DIAGNOSIS — Z720 Tobacco use: Secondary | ICD-10-CM | POA: Diagnosis not present

## 2014-06-16 DIAGNOSIS — K219 Gastro-esophageal reflux disease without esophagitis: Secondary | ICD-10-CM | POA: Diagnosis not present

## 2014-06-16 DIAGNOSIS — M79672 Pain in left foot: Secondary | ICD-10-CM | POA: Diagnosis present

## 2014-06-16 DIAGNOSIS — Z9889 Other specified postprocedural states: Secondary | ICD-10-CM | POA: Insufficient documentation

## 2014-06-16 DIAGNOSIS — G8929 Other chronic pain: Secondary | ICD-10-CM | POA: Insufficient documentation

## 2014-06-16 DIAGNOSIS — G40909 Epilepsy, unspecified, not intractable, without status epilepticus: Secondary | ICD-10-CM | POA: Diagnosis not present

## 2014-06-16 DIAGNOSIS — Z9861 Coronary angioplasty status: Secondary | ICD-10-CM | POA: Diagnosis not present

## 2014-06-16 DIAGNOSIS — F329 Major depressive disorder, single episode, unspecified: Secondary | ICD-10-CM | POA: Diagnosis not present

## 2014-06-16 DIAGNOSIS — I2510 Atherosclerotic heart disease of native coronary artery without angina pectoris: Secondary | ICD-10-CM | POA: Insufficient documentation

## 2014-06-16 MED ORDER — IBUPROFEN 200 MG PO TABS
600.0000 mg | ORAL_TABLET | Freq: Once | ORAL | Status: AC
  Administered 2014-06-16: 600 mg via ORAL
  Filled 2014-06-16: qty 3

## 2014-06-16 NOTE — ED Notes (Signed)
The pt is c/o  Lt foot pain  For months he has a blister on the bottom of his lt foot beneath his lt 4th and 5th toes.Marland Kitchen

## 2014-06-16 NOTE — Discharge Instructions (Signed)
Apply bacitracin twice a day. Keep blisters covered. Keep close eye on them to make sure they are not getting infected. Keep your feet dry. Try to air out several times a day. Follow-up as needed.   Blisters Blisters are fluid-filled sacs that form within the skin. Common causes of blistering are friction, burns, and exposure to irritating chemicals. The fluid in the blister protects the underlying damaged skin. Most of the time it is not recommended that you open blisters. When a blister is opened, there is an increased chance for infection. Usually, a blister will open on its own. They then dry up and peel off within 10 days. If the blister is tense and uncomfortable (painful) the fluid may be drained. If it is drained the roof of the blister should be left intact. The draining should only be done by a medical professional under aseptic conditions. Poorly fitting shoes and boots can cause blisters by being too tight or too loose. Wearing extra socks or using tape, bandages, or pads over the blister-prone area helps prevent the problem by reducing friction. Blisters heal more slowly if you have diabetes or if you have problems with your circulation. You need to be careful about medical follow-up to prevent infection. HOME CARE INSTRUCTIONS  Protect areas where blisters have formed until the skin is healed. Use a special bandage with a hole cut in the middle around the blister. This reduces pressure and friction. When the blister breaks, trim off the loose skin and keep the area clean by washing it with soap daily. Soaking the blister or broken-open blister with diluted vinegar twice daily for 15 minutes will dry it up and speed the healing. Use 3 tablespoons of white vinegar per quart of water (45 mL white vinegar per liter of water). An antibiotic ointment and a bandage can be used to cover the area after soaking.  SEEK MEDICAL CARE IF:   You develop increased redness, pain, swelling, or drainage in the  blistered area.  You develop a pus-like discharge from the blistered area, chills, or a fever. MAKE SURE YOU:   Understand these instructions.  Will watch your condition.  Will get help right away if you are not doing well or get worse. Document Released: 01/29/2004 Document Revised: 03/15/2011 Document Reviewed: 12/27/2007 [facility] Patient Information 2015 [place], [place]. This information is not intended to replace advice given to you by your health care provider. Make sure you discuss any questions you have with your health care provider.

## 2014-06-16 NOTE — ED Provider Notes (Signed)
CSN: [id number]     Arrival date & time 06/16/14  1703 History   First MD Initiated Contact with Patient 06/16/14 1806     Chief Complaint  Patient presents with  . Foot Pain     (Consider location/radiation/quality/duration/timing/severity/associated sxs/prior Treatment) HPI Chase Blackwell is a 49 y.o. male with history of hypertension, schizophrenia, pancreatitis, seizures, coronary disease, presents to emergency department complaining of blister to his foot. Patient states he has been walking a lot. He states blister started approximately 2 weeks ago. He has not tried any treatment. Patient reports pain with walking and palpation of the blistered area. Patient denies any known injuries. He denies any fever chills or malaise.  Past Medical History  Diagnosis Date  . Hypertension   . Asthma   . GERD (gastroesophageal reflux disease)   . Chronic pain   . Schizophrenia   . Alcohol abuse   . Seizures   . Pancreatitis   . Depression   . CAD (coronary artery disease)    Past Surgical History  Procedure Laterality Date  . Left heart catheterization with coronary angiogram N/A 07/06/2013    Procedure: LEFT HEART CATHETERIZATION WITH CORONARY ANGIOGRAM;  Surgeon: Clent Demark, MD;  Location: [facility] CATH LAB;  Service: Cardiovascular;  Laterality: N/A;  . Percutaneous coronary stent intervention (pci-s)  07/06/2013    Procedure: PERCUTANEOUS CORONARY STENT INTERVENTION (PCI-S);  Surgeon: Clent Demark, MD;  Location: [facility] CATH LAB;  Service: Cardiovascular;;   Family History  Problem Relation Age of Onset  . Malignant hyperthermia Mother   . Cirrhosis Father   . Alcohol abuse Father    History  Substance Use Topics  . Smoking status: Current Every Day Smoker -- 0.00 packs/day    Types: Cigarettes  . Smokeless tobacco: Not on file  . Alcohol Use: Yes     Comment: daily heavy drinker    Review of Systems  Constitutional: Negative for fever and chills.  Respiratory: Negative for  cough, chest tightness and shortness of breath.   Cardiovascular: Negative for chest pain, palpitations and leg swelling.  Musculoskeletal: Positive for arthralgias.  Skin: Positive for wound.  Allergic/Immunologic: Negative for immunocompromised state.      Allergies  Hctz; Hydroxyzine; Sulfonamide derivatives; and Cetirizine & related  Home Medications   Prior to Admission medications   Medication Sig Start Date End Date Taking? Authorizing Provider  albuterol (PROVENTIL HFA;VENTOLIN HFA) 108 (90 BASE) MCG/ACT inhaler Inhale 2 puffs into the lungs every 4 (four) hours as needed for wheezing or shortness of breath. 02/13/14   Nicole Pisciotta, PA-C  amoxicillin-clavulanate (AUGMENTIN) 875-125 MG per tablet Take 1 tablet by mouth every 12 (twelve) hours. Take for 6 days then stop. 06/11/14   Eugenie Filler, MD  aspirin 81 MG chewable tablet Chew 1 tablet (81 mg total) by mouth daily. 11/23/13   Kristen N Morello, DO  atorvastatin (LIPITOR) 80 MG tablet Take 1 tablet (80 mg total) by mouth daily. Patient taking differently: Take 80 mg by mouth at bedtime.  02/13/14   Nicole Pisciotta, PA-C  benztropine (COGENTIN) 0.5 MG tablet Take 0.5 mg by mouth 2 (two) times daily.    Historical Provider, MD  clopidogrel (PLAVIX) 75 MG tablet Take 1 tablet (75 mg total) by mouth daily. 02/13/14   Nicole Pisciotta, PA-C  divalproex (DEPAKOTE) 500 MG DR tablet Take 1 tablet (500 mg total) by mouth 2 (two) times daily. 02/13/14   Nicole Pisciotta, PA-C  famotidine (PEPCID) 20 MG tablet Take  1 tablet (20 mg total) by mouth 2 (two) times daily. 02/13/14   Nicole Pisciotta, PA-C  feeding supplement, ENSURE ENLIVE, (ENSURE ENLIVE) LIQD Take 237 mLs by mouth 2 (two) times daily between meals. 06/11/14   Eugenie Filler, MD  fluticasone (FLONASE) 50 MCG/ACT nasal spray Place 2 sprays into both nostrils daily. Use for 1 week, then as needed. 06/11/14   Eugenie Filler, MD  folic acid (FOLVITE) 1 MG tablet Take 1 tablet  (1 mg total) by mouth daily. 06/11/14   Eugenie Filler, MD  furosemide (LASIX) 40 MG tablet Take 1 tablet (40 mg total) by mouth daily. 02/13/14   Nicole Pisciotta, PA-C  guaiFENesin (MUCINEX) 600 MG 12 hr tablet Take 1 tablet (600 mg total) by mouth 2 (two) times daily. Take for 6 days then stop. 06/11/14   Eugenie Filler, MD  haloperidol decanoate (HALDOL DECANOATE) 100 MG/ML injection Inject 1 mL into the muscle every 28 (twenty-eight) days. 05/24/14   Historical Provider, MD  hydrALAZINE (APRESOLINE) 10 MG tablet Take 1 tablet (10 mg total) by mouth 2 (two) times daily. 02/13/14   Nicole Pisciotta, PA-C  levETIRAcetam (KEPPRA) 250 MG tablet Take 5 tablets (1,250 mg total) by mouth 2 (two) times daily. 06/11/14   Eugenie Filler, MD  lisinopril (ZESTRIL) 2.5 MG tablet Take 1 tablet (2.5 mg total) by mouth daily. 02/13/14   Nicole Pisciotta, PA-C  LORazepam (ATIVAN) 0.5 MG tablet Take 0.5 mg by mouth at bedtime as needed for anxiety.    Historical Provider, MD  meloxicam (MOBIC) 15 MG tablet Take 1 tablet (15 mg total) by mouth daily. 11/13/13   Kaitlyn Szekalski, PA-C  metoprolol tartrate (LOPRESSOR) 25 MG tablet Take 0.5 tablets (12.5 mg total) by mouth 2 (two) times daily. Patient taking differently: Take 25 mg by mouth daily.  02/13/14   Nicole Pisciotta, PA-C  Multiple Vitamin (MULTIVITAMIN WITH MINERALS) TABS tablet Take 1 tablet by mouth daily. 06/11/14   Eugenie Filler, MD  oxyCODONE (ROXICODONE) 5 MG immediate release tablet Take one tablet by mouth twice daily for pain Patient taking differently: Take 5 mg by mouth daily as needed for severe pain. Take one tablet by mouth twice daily for pain 10/01/13   Tiffany L Reed, DO  QUEtiapine (SEROQUEL) 50 MG tablet Take 1 tablet (50 mg total) by mouth at bedtime. 02/13/14   Nicole Pisciotta, PA-C  thiamine 100 MG tablet Take 1 tablet (100 mg total) by mouth daily. 06/11/14   Eugenie Filler, MD  traZODone (DESYREL) 100 MG tablet Take 1 tablet by  mouth at bedtime as needed for sleep.  05/13/14   Historical Provider, MD   BP 119/72 mmHg  Pulse 73  Temp(Src) 98.5 F (36.9 C) (Oral)  Resp 16  Ht 6\' 1"  (1.854 m)  Wt 144 lb 9.6 oz (65.59 kg)  BMI 19.08 kg/m2  SpO2 97% Physical Exam  Constitutional: He appears well-developed and well-nourished. No distress.  Eyes: Conjunctivae are normal.  Neck: Neck supple.  Cardiovascular: Normal rate, regular rhythm and normal heart sounds.   Pulmonary/Chest: Effort normal and breath sounds normal. No respiratory distress. He has no wheezes. He has no rales.  Skin: Skin is warm and dry.  Open Blister to the inferior left fourth toe and to the distal left foot. No surrounding erythema. No drainage.  Nursing note and vitals reviewed.   ED Course  Procedures (including critical care time) Labs Review Labs Reviewed - No data to display  Imaging Review No results found.   EKG Interpretation None      MDM   Final diagnoses:  Blister of foot, left, initial encounter    Patient will blister to the left foot. Blister is open, skin was trimmed with sterile scissors. Bacitracin dressing applied. Instructed to keep it covered, bacitracin twice a day. Vital signs are normal. Patient is afebrile. He is not toxic appearing otherwise. Discharge home with follow-up.  Filed Vitals:   06/16/14 1708  BP: 119/72  Pulse: 73  Temp: 98.5 F (36.9 C)  TempSrc: Oral  Resp: 16  Height: 6\' 1"  (1.854 m)  Weight: 144 lb 9.6 oz (65.59 kg)  SpO2: 97%       Jeannett Senior, PA-C 06/16/14 [place], MD 06/17/14 1211

## 2014-06-18 ENCOUNTER — Emergency Department (HOSPITAL_COMMUNITY)
Admission: EM | Admit: 2014-06-18 | Discharge: 2014-06-18 | Disposition: A | Discharge status: Home / Self Care | Payer: Medicare Other | Attending: Emergency Medicine | Admitting: Emergency Medicine

## 2014-06-18 ENCOUNTER — Encounter (HOSPITAL_COMMUNITY): Payer: Self-pay

## 2014-06-18 ENCOUNTER — Emergency Department (HOSPITAL_COMMUNITY): Payer: Medicare Other

## 2014-06-18 DIAGNOSIS — I10 Essential (primary) hypertension: Secondary | ICD-10-CM | POA: Insufficient documentation

## 2014-06-18 DIAGNOSIS — Z7982 Long term (current) use of aspirin: Secondary | ICD-10-CM | POA: Insufficient documentation

## 2014-06-18 DIAGNOSIS — F329 Major depressive disorder, single episode, unspecified: Secondary | ICD-10-CM | POA: Diagnosis not present

## 2014-06-18 DIAGNOSIS — G40909 Epilepsy, unspecified, not intractable, without status epilepticus: Secondary | ICD-10-CM | POA: Diagnosis not present

## 2014-06-18 DIAGNOSIS — Z720 Tobacco use: Secondary | ICD-10-CM | POA: Diagnosis not present

## 2014-06-18 DIAGNOSIS — G8929 Other chronic pain: Secondary | ICD-10-CM | POA: Insufficient documentation

## 2014-06-18 DIAGNOSIS — F209 Schizophrenia, unspecified: Secondary | ICD-10-CM | POA: Insufficient documentation

## 2014-06-18 DIAGNOSIS — R4182 Altered mental status, unspecified: Secondary | ICD-10-CM

## 2014-06-18 DIAGNOSIS — K219 Gastro-esophageal reflux disease without esophagitis: Secondary | ICD-10-CM | POA: Insufficient documentation

## 2014-06-18 DIAGNOSIS — Z7901 Long term (current) use of anticoagulants: Secondary | ICD-10-CM | POA: Insufficient documentation

## 2014-06-18 DIAGNOSIS — F1210 Cannabis abuse, uncomplicated: Secondary | ICD-10-CM | POA: Diagnosis not present

## 2014-06-18 DIAGNOSIS — Z7902 Long term (current) use of antithrombotics/antiplatelets: Secondary | ICD-10-CM | POA: Insufficient documentation

## 2014-06-18 DIAGNOSIS — F1010 Alcohol abuse, uncomplicated: Secondary | ICD-10-CM | POA: Diagnosis not present

## 2014-06-18 DIAGNOSIS — Z79899 Other long term (current) drug therapy: Secondary | ICD-10-CM | POA: Diagnosis not present

## 2014-06-18 DIAGNOSIS — J449 Chronic obstructive pulmonary disease, unspecified: Secondary | ICD-10-CM | POA: Insufficient documentation

## 2014-06-18 DIAGNOSIS — J45909 Unspecified asthma, uncomplicated: Secondary | ICD-10-CM | POA: Insufficient documentation

## 2014-06-18 LAB — URINALYSIS, ROUTINE W REFLEX MICROSCOPIC
Bilirubin Urine: NEGATIVE
Glucose, UA: NEGATIVE mg/dL
KETONES UR: NEGATIVE mg/dL
Leukocytes, UA: NEGATIVE
NITRITE: NEGATIVE
Protein, ur: NEGATIVE mg/dL
SPECIFIC GRAVITY, URINE: 1.003 — AB (ref 1.005–1.030)
UROBILINOGEN UA: 0.2 mg/dL (ref 0.0–1.0)
pH: 7 (ref 5.0–8.0)

## 2014-06-18 LAB — COMPREHENSIVE METABOLIC PANEL
ALK PHOS: 50 U/L (ref 38–126)
ALT: 14 U/L — AB (ref 17–63)
AST: 14 U/L — ABNORMAL LOW (ref 15–41)
Albumin: 3 g/dL — ABNORMAL LOW (ref 3.5–5.0)
Anion gap: 10 (ref 5–15)
BUN: 7 mg/dL (ref 6–20)
CO2: 27 mmol/L (ref 22–32)
Calcium: 8.3 mg/dL — ABNORMAL LOW (ref 8.9–10.3)
Chloride: 102 mmol/L (ref 101–111)
Creatinine, Ser: 0.79 mg/dL (ref 0.61–1.24)
GFR calc non Af Amer: >60 mL/min (ref >60)
GLUCOSE: 83 mg/dL (ref 65–99)
POTASSIUM: 3.4 mmol/L — AB (ref 3.5–5.1)
Sodium: 139 mmol/L (ref 135–145)
Total Bilirubin: 0.3 mg/dL (ref 0.3–1.2)
Total Protein: 5.6 g/dL — ABNORMAL LOW (ref 6.5–8.1)

## 2014-06-18 LAB — RAPID URINE DRUG SCREEN, HOSP PERFORMED
Amphetamines: NOT DETECTED
BARBITURATES: NOT DETECTED
Benzodiazepines: NOT DETECTED
COCAINE: NOT DETECTED
Opiates: NOT DETECTED
Tetrahydrocannabinol: POSITIVE — AB

## 2014-06-18 LAB — I-STAT CHEM 8, ED
BUN: 6 mg/dL (ref 6–20)
CHLORIDE: 99 mmol/L — AB (ref 101–111)
CREATININE: 1 mg/dL (ref 0.61–1.24)
Calcium, Ion: 1.12 mmol/L (ref 1.12–1.23)
Glucose, Bld: 80 mg/dL (ref 65–99)
HCT: 33 % — ABNORMAL LOW (ref 39.0–52.0)
Hemoglobin: 11.2 g/dL — ABNORMAL LOW (ref 13.0–17.0)
Potassium: 3.4 mmol/L — ABNORMAL LOW (ref 3.5–5.1)
SODIUM: 139 mmol/L (ref 135–145)
TCO2: 24 mmol/L (ref 0–100)

## 2014-06-18 LAB — CBC WITH DIFFERENTIAL/PLATELET
Basophils Absolute: 0 10*3/uL (ref 0.0–0.1)
Basophils Relative: 0 % (ref 0–1)
EOS PCT: 5 % (ref 0–5)
Eosinophils Absolute: 0.4 10*3/uL (ref 0.0–0.7)
HCT: 32.5 % — ABNORMAL LOW (ref 39.0–52.0)
HEMOGLOBIN: 10.9 g/dL — AB (ref 13.0–17.0)
Lymphocytes Relative: 50 % — ABNORMAL HIGH (ref 12–46)
Lymphs Abs: 3.3 10*3/uL (ref 0.7–4.0)
MCH: 30.8 pg (ref 26.0–34.0)
MCHC: 33.5 g/dL (ref 30.0–36.0)
MCV: 91.8 fL (ref 78.0–100.0)
MONOS PCT: 6 % (ref 3–12)
Monocytes Absolute: 0.4 10*3/uL (ref 0.1–1.0)
NEUTROS ABS: 2.7 10*3/uL (ref 1.7–7.7)
Neutrophils Relative %: 39 % — ABNORMAL LOW (ref 43–77)
Platelets: 305 10*3/uL (ref 150–400)
RBC: 3.54 MIL/uL — ABNORMAL LOW (ref 4.22–5.81)
RDW: 14.2 % (ref 11.5–15.5)
WBC: 6.9 10*3/uL (ref 4.0–10.5)

## 2014-06-18 LAB — ETHANOL: Alcohol, Ethyl (B): 89 mg/dL — ABNORMAL HIGH (ref <5)

## 2014-06-18 LAB — PROTIME-INR
INR: 1.13 (ref 0.00–1.49)
Prothrombin Time: 14.7 seconds (ref 11.6–15.2)

## 2014-06-18 LAB — URINE MICROSCOPIC-ADD ON

## 2014-06-18 LAB — SALICYLATE LEVEL: Salicylate Lvl: <4 mg/dL (ref 2.8–30.0)

## 2014-06-18 LAB — ACETAMINOPHEN LEVEL: Acetaminophen (Tylenol), Serum: <10 ug/mL — ABNORMAL LOW (ref 10–30)

## 2014-06-18 LAB — LIPASE, BLOOD: Lipase: 388 U/L — ABNORMAL HIGH (ref 22–51)

## 2014-06-18 MED ORDER — LEVETIRACETAM 250 MG PO TABS: 1250.0000 mg | ORAL_TABLET | Freq: Two times a day (BID) | ORAL | Status: DC

## 2014-06-18 MED ORDER — SODIUM CHLORIDE 0.9 % IV SOLN
1000.0000 mg | Freq: Once | INTRAVENOUS | Status: AC
  Administered 2014-06-18: 1000 mg via INTRAVENOUS
  Filled 2014-06-18: qty 10

## 2014-06-18 MED ORDER — SODIUM CHLORIDE 0.9 % IV BOLUS (SEPSIS)
1000.0000 mL | Freq: Once | INTRAVENOUS | Status: AC
  Administered 2014-06-18: 1000 mL via INTRAVENOUS

## 2014-06-18 NOTE — Discharge Instructions (Signed)
Alcohol Intoxication Chase Blackwell, take your keppra as prescribed, 1250mg  twice per day.  See your primary care physician within 3 days for close follow up.  If any symptoms worsen, come back to the ED immediately.  Thank you. Alcohol intoxication occurs when you drink enough alcohol that it affects your ability to function. It can be mild or very severe. Drinking a lot of alcohol in a short time is called binge drinking. This can be very harmful. Drinking alcohol can also be more dangerous if you are taking medicines or other drugs. Some of the effects caused by alcohol may include:  Loss of coordination.  Changes in mood and behavior.  Unclear thinking.  Trouble talking (slurred speech).  Throwing up (vomiting).  Confusion.  Slowed breathing.  Twitching and shaking (seizures).  Loss of consciousness. HOME CARE  Do not drive after drinking alcohol.  Drink enough water and fluids to keep your pee (urine) clear or pale yellow. Avoid caffeine.  Only take medicine as told by your doctor. GET HELP IF:  You throw up (vomit) many times.  You do not feel better after a few days.  You frequently have alcohol intoxication. Your doctor can help decide if you should see a substance use treatment counselor. GET HELP RIGHT AWAY IF:  You become shaky when you stop drinking.  You have twitching and shaking.  You throw up blood. It may look bright red or like coffee grounds.  You notice blood in your poop (bowel movements).  You become lightheaded or pass out (faint). MAKE SURE YOU:   Understand these instructions.  Will watch your condition.  Will get help right away if you are not doing well or get worse. Document Released: 06/09/2007 Document Revised: 08/23/2012 Document Reviewed: 05/26/2012 [facility] Patient Information 2015 [place], [place]. This information is not intended to replace advice given to you by your health care provider. Make sure you discuss any questions you have  with your health care provider. Seizure, Adult A seizure means there is unusual activity in the brain. A seizure can cause changes in attention or behavior. Seizures often cause shaking (convulsions). Seizures often last from 30 seconds to 2 minutes. HOME CARE   If you are given medicines, take them exactly as told by your doctor.  Keep all doctor visits as told.  Do not swim or drive until your doctor says it is okay.  Teach others what to do if you have a seizure. They should:  Lay you on the ground.  Put a cushion under your head.  Loosen any tight clothing around your neck.  Turn you on your side.  Stay with you until you get better. GET HELP RIGHT AWAY IF:   The seizure lasts longer than 2 to 5 minutes.  The seizure is very bad.  The person does not wake up after the seizure.  The person's attention or behavior changes. Drive the person to the emergency room or call your local emergency services (911 in [place] MAKE SURE YOU:   Understand these instructions.  Will watch your condition.  Will get help right away if you are not doing well or get worse. Document Released: 06/09/2007 Document Revised: 03/15/2011 Document Reviewed: 12/09/2010 [facility] Patient Information 2015 [place], [place]. This information is not intended to replace advice given to you by your health care provider. Make sure you discuss any questions you have with your health care provider.

## 2014-06-18 NOTE — ED Provider Notes (Signed)
CSN: [id number]     Arrival date & time 06/18/14  0021 History  This chart was scribed for Chase Balls, MD by Julien Nordmann, ED Scribe. This patient was seen in room A06C/A06C and the patient's care was started at 12:39 AM.    No chief complaint on file.  LEVEL 5 CAVEAT  The history is limited by the condition of the patient. No language interpreter was used.    HPI Comments: Chase CARIGNAN is a 49 y.o. male who presents to the Emergency Department complaining of seizures. Per EMS, pt was found after a suspected seizure occurred. Pt is not compliant with his medications. He has some alcohol in his system and is semi-responsive. Pt obtained a laceration on his nose.   Past Medical History  Diagnosis Date  . Hypertension   . Asthma   . GERD (gastroesophageal reflux disease)   . Chronic pain   . Schizophrenia   . Alcohol abuse   . Seizures   . Pancreatitis   . Depression   . CAD (coronary artery disease)    Past Surgical History  Procedure Laterality Date  . Left heart catheterization with coronary angiogram N/A 07/06/2013    Procedure: LEFT HEART CATHETERIZATION WITH CORONARY ANGIOGRAM;  Surgeon: Clent Demark, MD;  Location: [facility] CATH LAB;  Service: Cardiovascular;  Laterality: N/A;  . Percutaneous coronary stent intervention (pci-s)  07/06/2013    Procedure: PERCUTANEOUS CORONARY STENT INTERVENTION (PCI-S);  Surgeon: Clent Demark, MD;  Location: [facility] CATH LAB;  Service: Cardiovascular;;   Family History  Problem Relation Age of Onset  . Malignant hyperthermia Mother   . Cirrhosis Father   . Alcohol abuse Father    History  Substance Use Topics  . Smoking status: Current Every Day Smoker -- 0.00 packs/day    Types: Cigarettes  . Smokeless tobacco: Not on file  . Alcohol Use: Yes     Comment: daily heavy drinker    Review of Systems  A complete 10 system review of systems was obtained and all systems are negative except as noted in the HPI and PMH.    Allergies  Hctz;  Hydroxyzine; Sulfonamide derivatives; and Cetirizine & related  Home Medications   Prior to Admission medications   Medication Sig Start Date End Date Taking? Authorizing Provider  albuterol (PROVENTIL HFA;VENTOLIN HFA) 108 (90 BASE) MCG/ACT inhaler Inhale 2 puffs into the lungs every 4 (four) hours as needed for wheezing or shortness of breath. 02/13/14   Nicole Pisciotta, PA-C  amoxicillin-clavulanate (AUGMENTIN) 875-125 MG per tablet Take 1 tablet by mouth every 12 (twelve) hours. Take for 6 days then stop. 06/11/14   Eugenie Filler, MD  aspirin 81 MG chewable tablet Chew 1 tablet (81 mg total) by mouth daily. 11/23/13   Kristen N Messing, DO  atorvastatin (LIPITOR) 80 MG tablet Take 1 tablet (80 mg total) by mouth daily. Patient taking differently: Take 80 mg by mouth at bedtime.  02/13/14   Nicole Pisciotta, PA-C  benztropine (COGENTIN) 0.5 MG tablet Take 0.5 mg by mouth 2 (two) times daily.    Historical Provider, MD  clopidogrel (PLAVIX) 75 MG tablet Take 1 tablet (75 mg total) by mouth daily. 02/13/14   Nicole Pisciotta, PA-C  divalproex (DEPAKOTE) 500 MG DR tablet Take 1 tablet (500 mg total) by mouth 2 (two) times daily. 02/13/14   Nicole Pisciotta, PA-C  famotidine (PEPCID) 20 MG tablet Take 1 tablet (20 mg total) by mouth 2 (two) times daily. 02/13/14  Nicole Pisciotta, PA-C  feeding supplement, ENSURE ENLIVE, (ENSURE ENLIVE) LIQD Take 237 mLs by mouth 2 (two) times daily between meals. 06/11/14   Eugenie Filler, MD  fluticasone (FLONASE) 50 MCG/ACT nasal spray Place 2 sprays into both nostrils daily. Use for 1 week, then as needed. 06/11/14   Eugenie Filler, MD  folic acid (FOLVITE) 1 MG tablet Take 1 tablet (1 mg total) by mouth daily. 06/11/14   Eugenie Filler, MD  furosemide (LASIX) 40 MG tablet Take 1 tablet (40 mg total) by mouth daily. 02/13/14   Nicole Pisciotta, PA-C  guaiFENesin (MUCINEX) 600 MG 12 hr tablet Take 1 tablet (600 mg total) by mouth 2 (two) times daily. Take for 6  days then stop. 06/11/14   Eugenie Filler, MD  haloperidol decanoate (HALDOL DECANOATE) 100 MG/ML injection Inject 1 mL into the muscle every 28 (twenty-eight) days. 05/24/14   Historical Provider, MD  hydrALAZINE (APRESOLINE) 10 MG tablet Take 1 tablet (10 mg total) by mouth 2 (two) times daily. 02/13/14   Nicole Pisciotta, PA-C  levETIRAcetam (KEPPRA) 250 MG tablet Take 5 tablets (1,250 mg total) by mouth 2 (two) times daily. 06/11/14   Eugenie Filler, MD  lisinopril (ZESTRIL) 2.5 MG tablet Take 1 tablet (2.5 mg total) by mouth daily. 02/13/14   Nicole Pisciotta, PA-C  LORazepam (ATIVAN) 0.5 MG tablet Take 0.5 mg by mouth at bedtime as needed for anxiety.    Historical Provider, MD  meloxicam (MOBIC) 15 MG tablet Take 1 tablet (15 mg total) by mouth daily. 11/13/13   Kaitlyn Szekalski, PA-C  metoprolol tartrate (LOPRESSOR) 25 MG tablet Take 0.5 tablets (12.5 mg total) by mouth 2 (two) times daily. Patient taking differently: Take 25 mg by mouth daily.  02/13/14   Nicole Pisciotta, PA-C  Multiple Vitamin (MULTIVITAMIN WITH MINERALS) TABS tablet Take 1 tablet by mouth daily. 06/11/14   Eugenie Filler, MD  oxyCODONE (ROXICODONE) 5 MG immediate release tablet Take one tablet by mouth twice daily for pain Patient taking differently: Take 5 mg by mouth daily as needed for severe pain. Take one tablet by mouth twice daily for pain 10/01/13   Tiffany L Reed, DO  QUEtiapine (SEROQUEL) 50 MG tablet Take 1 tablet (50 mg total) by mouth at bedtime. 02/13/14   Nicole Pisciotta, PA-C  thiamine 100 MG tablet Take 1 tablet (100 mg total) by mouth daily. 06/11/14   Eugenie Filler, MD  traZODone (DESYREL) 100 MG tablet Take 1 tablet by mouth at bedtime as needed for sleep.  05/13/14   Historical Provider, MD   SpO2 98% Physical Exam  Constitutional: He is oriented to person, place, and time. Vital signs are normal. He appears well-developed and well-nourished.  Non-toxic appearance. He does not appear ill. No  distress.  HENT:  Head: Normocephalic and atraumatic.  Nose: Nose normal.  Mouth/Throat: Oropharynx is clear and moist. No oropharyngeal exudate.  Eyes: Conjunctivae and EOM are normal. Pupils are equal, round, and reactive to light. No scleral icterus.  Neck: Normal range of motion. Neck supple. No tracheal deviation, no edema, no erythema and normal range of motion present. No thyroid mass and no thyromegaly present.  No obvious deformities c collar is in place  Cardiovascular: Normal rate, regular rhythm, S1 normal, S2 normal, normal heart sounds, intact distal pulses and normal pulses.  Exam reveals no gallop and no friction rub.   No murmur heard. Pulses:      Radial pulses are 2+ on the  right side, and 2+ on the left side.       Dorsalis pedis pulses are 2+ on the right side, and 2+ on the left side.  Pulmonary/Chest: Effort normal and breath sounds normal. No respiratory distress. He has no wheezes. He has no rhonchi. He has no rales.  Abdominal: Soft. Normal appearance and bowel sounds are normal. He exhibits no distension, no ascites and no mass. There is no hepatosplenomegaly. There is no tenderness. There is no rebound, no guarding and no CVA tenderness.  Musculoskeletal: Normal range of motion. He exhibits no edema or tenderness.  Moves all 4 extremities drowsy  Lymphadenopathy:    He has no cervical adenopathy.  Neurological: He is alert and oriented to person, place, and time. He has normal strength. No cranial nerve deficit or sensory deficit.  Skin: Skin is warm, dry and intact. No petechiae and no rash noted. He is not diaphoretic. No erythema. No pallor.  superficial laceration across the nasal bridge    Psychiatric: He has a normal mood and affect. His behavior is normal. Judgment normal.  Nursing note and vitals reviewed.   ED Course  Procedures  DIAGNOSTIC STUDIES: Oxygen Saturation is 98% on RA, normal by my interpretation.  COORDINATION OF CARE:  12:45 AM  Discussed treatment plan which includes lab work, imaging with pt at bedside and pt agreed to plan.   Labs Review Labs Reviewed  CBC WITH DIFFERENTIAL/PLATELET - Abnormal; Notable for the following:    RBC 3.54 (*)    Hemoglobin 10.9 (*)    HCT 32.5 (*)    Neutrophils Relative % 39 (*)    Lymphocytes Relative 50 (*)    All other components within normal limits  COMPREHENSIVE METABOLIC PANEL - Abnormal; Notable for the following:    Potassium 3.4 (*)    Calcium 8.3 (*)    Total Protein 5.6 (*)    Albumin 3.0 (*)    AST 14 (*)    ALT 14 (*)    All other components within normal limits  LIPASE, BLOOD - Abnormal; Notable for the following:    Lipase 388 (*)    All other components within normal limits  URINALYSIS, ROUTINE W REFLEX MICROSCOPIC (NOT AT [facility]) - Abnormal; Notable for the following:    Specific Gravity, Urine 1.003 (*)    Hgb urine dipstick MODERATE (*)    All other components within normal limits  ETHANOL - Abnormal; Notable for the following:    Alcohol, Ethyl (B) 89 (*)    All other components within normal limits  URINE RAPID DRUG SCREEN, HOSP PERFORMED - Abnormal; Notable for the following:    Tetrahydrocannabinol POSITIVE (*)    All other components within normal limits  ACETAMINOPHEN LEVEL - Abnormal; Notable for the following:    Acetaminophen (Tylenol), Serum <10 (*)    All other components within normal limits  I-STAT CHEM 8, ED - Abnormal; Notable for the following:    Potassium 3.4 (*)    Chloride 99 (*)    Hemoglobin 11.2 (*)    HCT 33.0 (*)    All other components within normal limits  PROTIME-INR  SALICYLATE LEVEL  URINE MICROSCOPIC-ADD ON    Imaging Review Dg Chest 2 View  06/18/2014   CLINICAL DATA:  Acute onset of altered mental status. Seizure. High blood pressure. Initial encounter.  EXAM: CHEST  2 VIEW  COMPARISON:  Chest radiograph performed 06/09/2014  FINDINGS: The lungs are well-aerated. Peribronchial thickening is noted. Minimal  bibasilar  atelectasis noted. There is no evidence of pleural effusion or pneumothorax.  The heart is normal in size; the mediastinal contour is within normal limits. No acute osseous abnormalities are seen.  IMPRESSION: Peribronchial thickening noted.  Minimal bibasilar atelectasis seen.   Electronically Signed   By: Garald Balding M.D.   On: 06/18/2014 01:43   Ct Head Wo Contrast  06/18/2014   CLINICAL DATA:  Found after suspected seizure. Semi-responsive. Laceration on the nose. Concern for head or cervical spine injury. Initial encounter.  EXAM: CT HEAD WITHOUT CONTRAST  CT CERVICAL SPINE WITHOUT CONTRAST  TECHNIQUE: Multidetector CT imaging of the head and cervical spine was performed following the standard protocol without intravenous contrast. Multiplanar CT image reconstructions of the cervical spine were also generated.  COMPARISON:  CT of the head performed 06/09/2014, and MRI of the brain performed 07/11/2013  FINDINGS: CT HEAD FINDINGS  There is no evidence of acute infarction, mass lesion, or intra- or extra-axial hemorrhage on CT.  Prominence of the sulci suggests mild cortical volume loss. Mild cerebellar atrophy is noted.  The brainstem and fourth ventricle are within normal limits. The basal ganglia are unremarkable in appearance. The cerebral hemispheres demonstrate grossly normal gray-white differentiation. No mass effect or midline shift is seen.  There is no evidence of fracture; visualized osseous structures are unremarkable in appearance. The orbits are within normal limits. The paranasal sinuses and mastoid air cells are well-aerated. No significant soft tissue abnormalities are seen.  CT CERVICAL SPINE FINDINGS  There is no evidence of fracture or subluxation. Vertebral bodies demonstrate normal height and alignment. Intervertebral disc spaces are preserved. A few anterior and posterior disc osteophyte complexes are noted along the lower cervical spine. Prevertebral soft tissues are within  normal limits. The visualized neural foramina are grossly unremarkable.  The thyroid gland is unremarkable in appearance. Minimal blebs are seen at the right lung apex. No significant soft tissue abnormalities are seen.  IMPRESSION: 1. No evidence of traumatic intracranial injury or fracture. 2. No evidence of fracture or subluxation along the cervical spine. 3. Minimal degenerative change at the lower cervical spine. 4. Mild cortical volume loss noted. 5. Minimal blebs at the lung apex.   Electronically Signed   By: Garald Balding M.D.   On: 06/18/2014 01:57   Ct Cervical Spine Wo Contrast  06/18/2014   CLINICAL DATA:  Found after suspected seizure. Semi-responsive. Laceration on the nose. Concern for head or cervical spine injury. Initial encounter.  EXAM: CT HEAD WITHOUT CONTRAST  CT CERVICAL SPINE WITHOUT CONTRAST  TECHNIQUE: Multidetector CT imaging of the head and cervical spine was performed following the standard protocol without intravenous contrast. Multiplanar CT image reconstructions of the cervical spine were also generated.  COMPARISON:  CT of the head performed 06/09/2014, and MRI of the brain performed 07/11/2013  FINDINGS: CT HEAD FINDINGS  There is no evidence of acute infarction, mass lesion, or intra- or extra-axial hemorrhage on CT.  Prominence of the sulci suggests mild cortical volume loss. Mild cerebellar atrophy is noted.  The brainstem and fourth ventricle are within normal limits. The basal ganglia are unremarkable in appearance. The cerebral hemispheres demonstrate grossly normal gray-white differentiation. No mass effect or midline shift is seen.  There is no evidence of fracture; visualized osseous structures are unremarkable in appearance. The orbits are within normal limits. The paranasal sinuses and mastoid air cells are well-aerated. No significant soft tissue abnormalities are seen.  CT CERVICAL SPINE FINDINGS  There is no evidence  of fracture or subluxation. Vertebral bodies  demonstrate normal height and alignment. Intervertebral disc spaces are preserved. A few anterior and posterior disc osteophyte complexes are noted along the lower cervical spine. Prevertebral soft tissues are within normal limits. The visualized neural foramina are grossly unremarkable.  The thyroid gland is unremarkable in appearance. Minimal blebs are seen at the right lung apex. No significant soft tissue abnormalities are seen.  IMPRESSION: 1. No evidence of traumatic intracranial injury or fracture. 2. No evidence of fracture or subluxation along the cervical spine. 3. Minimal degenerative change at the lower cervical spine. 4. Mild cortical volume loss noted. 5. Minimal blebs at the lung apex.   Electronically Signed   By: Garald Balding M.D.   On: 06/18/2014 01:57     EKG Interpretation   Date/Time:  Tuesday June 18 2014 01:39:49 EDT Ventricular Rate:  61 PR Interval:  209 QRS Duration: 102 QT Interval:  438 QTC Calculation: 441 R Axis:   79 Text Interpretation:  Sinus rhythm Borderline prolonged PR interval ST  elev, probable normal early repol pattern vs pericarditis No significant  change since last tracing Confirmed by Glynn Octave [phone]) on  06/18/2014 1:57:37 AM      MDM   Final diagnoses:  None    Patient presents to the ED for alcohol abuse and seizure.  He has history of both as well as non compliance.  AMS workup reveals etoh of 89.  Likely cause of AMS is etoh abuse.  CT head is normal.  He was loaded with keppra in the ED and will be given Rx for 1250 BID as previously prescribed.  He was allowed to sober in the ED.  He can ambulate on his own without assistance.  His VS remain within his normal limits and he is safe for DC.  I personally performed the services described in this documentation, which was scribed in my presence. The recorded information has been reviewed and is accurate.   Chase Balls, MD 06/18/14 [phone]

## 2014-06-18 NOTE — ED Notes (Signed)
Pt refuses cardiac leads

## 2014-06-18 NOTE — ED Notes (Signed)
Pt stable, ambulatory, states understanding of discharge instructions 

## 2014-06-18 NOTE — ED Notes (Signed)
Pt arrived via EMS c/o unwitnessed seizure, family reports non compliant with medications, found down, postdictal, no oral injury, laceration across nose, in c-collar, ETOH on board per family.  Pt incontinent, unable to answer questions.

## 2014-06-30 ENCOUNTER — Encounter (HOSPITAL_COMMUNITY): Payer: Self-pay | Admitting: Emergency Medicine

## 2014-06-30 ENCOUNTER — Inpatient Hospital Stay (HOSPITAL_COMMUNITY)
Admission: EM | Admit: 2014-06-30 | Discharge: 2014-07-03 | DRG: 439 | Disposition: A | Discharge status: Home / Self Care | Payer: Medicare Other | Attending: Internal Medicine | Admitting: Internal Medicine

## 2014-06-30 DIAGNOSIS — F1020 Alcohol dependence, uncomplicated: Secondary | ICD-10-CM | POA: Diagnosis present

## 2014-06-30 DIAGNOSIS — I959 Hypotension, unspecified: Secondary | ICD-10-CM | POA: Diagnosis present

## 2014-06-30 DIAGNOSIS — F1010 Alcohol abuse, uncomplicated: Secondary | ICD-10-CM | POA: Diagnosis present

## 2014-06-30 DIAGNOSIS — G40909 Epilepsy, unspecified, not intractable, without status epilepticus: Secondary | ICD-10-CM | POA: Diagnosis present

## 2014-06-30 DIAGNOSIS — K219 Gastro-esophageal reflux disease without esophagitis: Secondary | ICD-10-CM | POA: Diagnosis present

## 2014-06-30 DIAGNOSIS — I25810 Atherosclerosis of coronary artery bypass graft(s) without angina pectoris: Secondary | ICD-10-CM | POA: Diagnosis not present

## 2014-06-30 DIAGNOSIS — R569 Unspecified convulsions: Secondary | ICD-10-CM | POA: Insufficient documentation

## 2014-06-30 DIAGNOSIS — R001 Bradycardia, unspecified: Secondary | ICD-10-CM | POA: Diagnosis present

## 2014-06-30 DIAGNOSIS — F200 Paranoid schizophrenia: Secondary | ICD-10-CM | POA: Diagnosis not present

## 2014-06-30 DIAGNOSIS — Z888 Allergy status to other drugs, medicaments and biological substances status: Secondary | ICD-10-CM | POA: Diagnosis not present

## 2014-06-30 DIAGNOSIS — F17210 Nicotine dependence, cigarettes, uncomplicated: Secondary | ICD-10-CM | POA: Diagnosis present

## 2014-06-30 DIAGNOSIS — K852 Alcohol induced acute pancreatitis: Principal | ICD-10-CM | POA: Diagnosis present

## 2014-06-30 DIAGNOSIS — G8929 Other chronic pain: Secondary | ICD-10-CM | POA: Diagnosis present

## 2014-06-30 DIAGNOSIS — I10 Essential (primary) hypertension: Secondary | ICD-10-CM | POA: Diagnosis not present

## 2014-06-30 DIAGNOSIS — D649 Anemia, unspecified: Secondary | ICD-10-CM

## 2014-06-30 DIAGNOSIS — J45909 Unspecified asthma, uncomplicated: Secondary | ICD-10-CM | POA: Diagnosis present

## 2014-06-30 DIAGNOSIS — F1290 Cannabis use, unspecified, uncomplicated: Secondary | ICD-10-CM | POA: Diagnosis present

## 2014-06-30 DIAGNOSIS — I2510 Atherosclerotic heart disease of native coronary artery without angina pectoris: Secondary | ICD-10-CM | POA: Diagnosis present

## 2014-06-30 DIAGNOSIS — K859 Acute pancreatitis without necrosis or infection, unspecified: Secondary | ICD-10-CM | POA: Diagnosis present

## 2014-06-30 DIAGNOSIS — R109 Unspecified abdominal pain: Secondary | ICD-10-CM | POA: Diagnosis present

## 2014-06-30 DIAGNOSIS — Z882 Allergy status to sulfonamides status: Secondary | ICD-10-CM | POA: Diagnosis not present

## 2014-06-30 DIAGNOSIS — R1013 Epigastric pain: Secondary | ICD-10-CM | POA: Diagnosis not present

## 2014-06-30 DIAGNOSIS — W57XXXA Bitten or stung by nonvenomous insect and other nonvenomous arthropods, initial encounter: Secondary | ICD-10-CM | POA: Diagnosis present

## 2014-06-30 LAB — URINALYSIS, ROUTINE W REFLEX MICROSCOPIC
Bilirubin Urine: NEGATIVE
GLUCOSE, UA: NEGATIVE mg/dL
Hgb urine dipstick: NEGATIVE
Ketones, ur: NEGATIVE mg/dL
LEUKOCYTES UA: NEGATIVE
Nitrite: NEGATIVE
PH: 6.5 (ref 5.0–8.0)
PROTEIN: NEGATIVE mg/dL
Specific Gravity, Urine: 1.006 (ref 1.005–1.030)
UROBILINOGEN UA: 1 mg/dL (ref 0.0–1.0)

## 2014-06-30 LAB — CBC WITH DIFFERENTIAL/PLATELET
Basophils Absolute: 0.2 10*3/uL — ABNORMAL HIGH (ref 0.0–0.1)
Basophils Relative: 3 % — ABNORMAL HIGH (ref 0–1)
Eosinophils Absolute: 0.1 10*3/uL (ref 0.0–0.7)
Eosinophils Relative: 2 % (ref 0–5)
HEMATOCRIT: 37.4 % — AB (ref 39.0–52.0)
Hemoglobin: 12.9 g/dL — ABNORMAL LOW (ref 13.0–17.0)
LYMPHS ABS: 1.8 10*3/uL (ref 0.7–4.0)
Lymphocytes Relative: 35 % (ref 12–46)
MCH: 31.7 pg (ref 26.0–34.0)
MCHC: 34.5 g/dL (ref 30.0–36.0)
MCV: 91.9 fL (ref 78.0–100.0)
Monocytes Absolute: 0.4 10*3/uL (ref 0.1–1.0)
Monocytes Relative: 8 % (ref 3–12)
NEUTROS PCT: 52 % (ref 43–77)
Neutro Abs: 2.6 10*3/uL (ref 1.7–7.7)
PLATELETS: 244 10*3/uL (ref 150–400)
RBC: 4.07 MIL/uL — AB (ref 4.22–5.81)
RDW: 14.5 % (ref 11.5–15.5)
WBC: 5.1 10*3/uL (ref 4.0–10.5)

## 2014-06-30 LAB — COMPREHENSIVE METABOLIC PANEL
ALBUMIN: 3.7 g/dL (ref 3.5–5.0)
ALK PHOS: 47 U/L (ref 38–126)
ALT: 10 U/L — ABNORMAL LOW (ref 17–63)
ANION GAP: 8 (ref 5–15)
AST: 14 U/L — ABNORMAL LOW (ref 15–41)
CHLORIDE: 104 mmol/L (ref 101–111)
CO2: 26 mmol/L (ref 22–32)
Calcium: 8.7 mg/dL — ABNORMAL LOW (ref 8.9–10.3)
Creatinine, Ser: 0.7 mg/dL (ref 0.61–1.24)
GFR calc Af Amer: >60 mL/min (ref >60)
GFR calc non Af Amer: >60 mL/min (ref >60)
Glucose, Bld: 141 mg/dL — ABNORMAL HIGH (ref 65–99)
Potassium: 3.6 mmol/L (ref 3.5–5.1)
SODIUM: 138 mmol/L (ref 135–145)
Total Bilirubin: 0.5 mg/dL (ref 0.3–1.2)
Total Protein: 6.6 g/dL (ref 6.5–8.1)

## 2014-06-30 LAB — ETHANOL: Alcohol, Ethyl (B): <5 mg/dL (ref <5)

## 2014-06-30 LAB — LIPASE, BLOOD: Lipase: 716 U/L — ABNORMAL HIGH (ref 22–51)

## 2014-06-30 MED ORDER — HYDROMORPHONE HCL 1 MG/ML IJ SOLN: 1.0000 mg | INTRAMUSCULAR | Status: AC | PRN

## 2014-06-30 MED ORDER — BENZTROPINE MESYLATE 0.5 MG PO TABS
0.5000 mg | ORAL_TABLET | Freq: Two times a day (BID) | ORAL | Status: DC
  Administered 2014-07-01 – 2014-07-03 (×6): 0.5 mg via ORAL
  Filled 2014-06-30 (×8): qty 1

## 2014-06-30 MED ORDER — LORAZEPAM 2 MG/ML IJ SOLN
0.0000 mg | Freq: Four times a day (QID) | INTRAMUSCULAR | Status: AC
Start: 2014-06-30 — End: 2014-07-02
  Filled 2014-06-30: qty 1

## 2014-06-30 MED ORDER — LORAZEPAM 2 MG/ML IJ SOLN
0.0000 mg | Freq: Two times a day (BID) | INTRAMUSCULAR | Status: DC
  Administered 2014-07-01: 2 mg via INTRAVENOUS

## 2014-06-30 MED ORDER — HYDROMORPHONE HCL 1 MG/ML IJ SOLN
1.0000 mg | Freq: Once | INTRAMUSCULAR | Status: AC
  Administered 2014-06-30: 1 mg via INTRAVENOUS
  Filled 2014-06-30: qty 1

## 2014-06-30 MED ORDER — OXYCODONE HCL 5 MG PO TABS
5.0000 mg | ORAL_TABLET | ORAL | Status: DC | PRN
  Administered 2014-07-01 – 2014-07-03 (×6): 5 mg via ORAL
  Filled 2014-06-30 (×6): qty 1

## 2014-06-30 MED ORDER — LORAZEPAM 2 MG/ML IJ SOLN
1.0000 mg | Freq: Four times a day (QID) | INTRAMUSCULAR | Status: DC | PRN
  Administered 2014-07-02: 1 mg via INTRAVENOUS
  Filled 2014-06-30: qty 1

## 2014-06-30 MED ORDER — LORAZEPAM 1 MG PO TABS
1.0000 mg | ORAL_TABLET | Freq: Four times a day (QID) | ORAL | Status: DC | PRN
  Administered 2014-07-02: 1 mg via ORAL
  Filled 2014-06-30: qty 1

## 2014-06-30 MED ORDER — VITAMIN B-1 100 MG PO TABS
100.0000 mg | ORAL_TABLET | Freq: Every day | ORAL | Status: DC
  Administered 2014-07-01 – 2014-07-03 (×3): 100 mg via ORAL
  Filled 2014-06-30 (×3): qty 1

## 2014-06-30 MED ORDER — SODIUM CHLORIDE 0.9 % IV BOLUS (SEPSIS)
1000.0000 mL | Freq: Once | INTRAVENOUS | Status: AC
  Administered 2014-06-30: 1000 mL via INTRAVENOUS

## 2014-06-30 MED ORDER — ADULT MULTIVITAMIN W/MINERALS CH
1.0000 | ORAL_TABLET | Freq: Every day | ORAL | Status: DC
  Administered 2014-07-01 – 2014-07-03 (×3): 1 via ORAL
  Filled 2014-06-30 (×3): qty 1

## 2014-06-30 MED ORDER — ALUM & MAG HYDROXIDE-SIMETH 200-200-20 MG/5ML PO SUSP: 30.0000 mL | Freq: Four times a day (QID) | ORAL | Status: DC | PRN

## 2014-06-30 MED ORDER — SODIUM CHLORIDE 0.9 % IV SOLN
INTRAVENOUS | Status: DC
  Administered 2014-06-30 – 2014-07-02 (×7): via INTRAVENOUS

## 2014-06-30 MED ORDER — PANTOPRAZOLE SODIUM 40 MG IV SOLR
40.0000 mg | Freq: Once | INTRAVENOUS | Status: AC
  Administered 2014-06-30: 40 mg via INTRAVENOUS
  Filled 2014-06-30: qty 40

## 2014-06-30 MED ORDER — THIAMINE HCL 100 MG/ML IJ SOLN
100.0000 mg | Freq: Every day | INTRAMUSCULAR | Status: DC
  Filled 2014-06-30: qty 1
  Filled 2014-06-30: qty 2

## 2014-06-30 MED ORDER — PANTOPRAZOLE SODIUM 40 MG IV SOLR
40.0000 mg | Freq: Two times a day (BID) | INTRAVENOUS | Status: DC
  Administered 2014-07-01 – 2014-07-03 (×5): 40 mg via INTRAVENOUS
  Filled 2014-06-30 (×6): qty 40

## 2014-06-30 MED ORDER — CLOPIDOGREL BISULFATE 75 MG PO TABS
75.0000 mg | ORAL_TABLET | Freq: Every day | ORAL | Status: DC
  Administered 2014-07-01 – 2014-07-03 (×3): 75 mg via ORAL
  Filled 2014-06-30 (×3): qty 1

## 2014-06-30 MED ORDER — ENOXAPARIN SODIUM 40 MG/0.4ML ~~LOC~~ SOLN
40.0000 mg | SUBCUTANEOUS | Status: DC
  Administered 2014-07-01 – 2014-07-03 (×3): 40 mg via SUBCUTANEOUS
  Filled 2014-06-30 (×4): qty 0.4

## 2014-06-30 MED ORDER — QUETIAPINE FUMARATE 50 MG PO TABS
50.0000 mg | ORAL_TABLET | Freq: Every day | ORAL | Status: DC
  Administered 2014-07-01 – 2014-07-02 (×3): 50 mg via ORAL
  Filled 2014-06-30 (×4): qty 1

## 2014-06-30 MED ORDER — ONDANSETRON HCL 4 MG/2ML IJ SOLN
4.0000 mg | Freq: Once | INTRAMUSCULAR | Status: AC
  Administered 2014-06-30: 4 mg via INTRAVENOUS
  Filled 2014-06-30: qty 2

## 2014-06-30 MED ORDER — ACETAMINOPHEN 325 MG PO TABS: 650.0000 mg | ORAL_TABLET | Freq: Four times a day (QID) | ORAL | Status: DC | PRN

## 2014-06-30 MED ORDER — FOLIC ACID 1 MG PO TABS
1.0000 mg | ORAL_TABLET | Freq: Every day | ORAL | Status: DC
  Administered 2014-07-01 – 2014-07-03 (×3): 1 mg via ORAL
  Filled 2014-06-30 (×3): qty 1

## 2014-06-30 MED ORDER — ONDANSETRON HCL 4 MG/2ML IJ SOLN: 4.0000 mg | Freq: Three times a day (TID) | INTRAMUSCULAR | Status: AC | PRN

## 2014-06-30 MED ORDER — HALOPERIDOL DECANOATE 100 MG/ML IM SOLN: 100.0000 mg | INTRAMUSCULAR | Status: DC

## 2014-06-30 MED ORDER — ATORVASTATIN CALCIUM 80 MG PO TABS
80.0000 mg | ORAL_TABLET | Freq: Every day | ORAL | Status: DC
  Administered 2014-07-01 – 2014-07-02 (×3): 80 mg via ORAL
  Filled 2014-06-30 (×4): qty 1

## 2014-06-30 MED ORDER — SODIUM CHLORIDE 0.9 % IV SOLN: INTRAVENOUS | Status: AC

## 2014-06-30 MED ORDER — ACETAMINOPHEN 650 MG RE SUPP: 650.0000 mg | Freq: Four times a day (QID) | RECTAL | Status: DC | PRN

## 2014-06-30 MED ORDER — DIVALPROEX SODIUM 500 MG PO DR TAB
500.0000 mg | DELAYED_RELEASE_TABLET | Freq: Two times a day (BID) | ORAL | Status: DC
  Administered 2014-07-01 – 2014-07-03 (×6): 500 mg via ORAL
  Filled 2014-06-30 (×7): qty 1

## 2014-06-30 NOTE — H&P (Signed)
[facility] Admission History and Physical       Eswin Worrell Battle PIR:[id number] DOB: [birth year] DOA: 06/30/2014  Referring physician: EDP PCP: [facility]  Specialists:   Chief Complaint: ABD Pain  HPI: Janine Ores Boster is a 49 y.o. male with a history of CAD, HTN, Schizophrenia, and Alcohol Abuse who presents to the ED with complaints of epigastric ABD Pain and nausea and vomiting x 2 days.   He denies any fever or chills or hematemesis.  He was evaluated in the ED and was found to have a Lipase level of 716.      Review of Systems: Constitutional: No Weight Loss, No Weight Gain, Night Sweats, Fevers, Chills, Dizziness, Light Headedness, Fatigue, or Generalized Weakness HEENT: No Headaches, Difficulty Swallowing,Tooth/Dental Problems,Sore Throat,  No Sneezing, Rhinitis, Ear Ache, Nasal Congestion, or Post Nasal Drip,  Cardio-vascular:  No Chest pain, Orthopnea, PND, Edema in Lower Extremities, Anasarca, Dizziness, Palpitations  Resp: No Dyspnea, No DOE, No Productive Cough, No Non-Productive Cough, No Hemoptysis, No Wheezing.    GI: No Heartburn, Indigestion, +Abdominal Pain, +Nausea, +Vomiting, Diarrhea, Constipation, Hematemesis, Hematochezia, Melena, Change in Bowel Habits,  Loss of Appetite  GU: No Dysuria, No Change in Color of Urine, No Urgency or Urinary Frequency, No Flank pain.  Musculoskeletal: No Joint Pain or Swelling, No Decreased Range of Motion, No Back Pain.  Neurologic: No Syncope, No Seizures, Muscle Weakness, Paresthesia, Vision Disturbance or Loss, No Diplopia, No Vertigo, No Difficulty Walking,  Skin: No Rash or Lesions. Psych: No Change in Mood or Affect, No Depression or Anxiety, No Memory loss, No Confusion, or Hallucinations   Past Medical History  Diagnosis Date  . Hypertension   . Asthma   . GERD (gastroesophageal reflux disease)   . Chronic pain   . Schizophrenia   . Alcohol abuse   . Seizures   . Pancreatitis   . Depression   . CAD  (coronary artery disease)      Past Surgical History  Procedure Laterality Date  . Left heart catheterization with coronary angiogram N/A 07/06/2013    Procedure: LEFT HEART CATHETERIZATION WITH CORONARY ANGIOGRAM;  Surgeon: Clent Demark, MD;  Location: [facility] CATH LAB;  Service: Cardiovascular;  Laterality: N/A;  . Percutaneous coronary stent intervention (pci-s)  07/06/2013    Procedure: PERCUTANEOUS CORONARY STENT INTERVENTION (PCI-S);  Surgeon: Clent Demark, MD;  Location: [facility] CATH LAB;  Service: Cardiovascular;;      Prior to Admission medications   Medication Sig Start Date End Date Taking? Authorizing Provider  albuterol (PROVENTIL HFA;VENTOLIN HFA) 108 (90 BASE) MCG/ACT inhaler Inhale 2 puffs into the lungs every 4 (four) hours as needed for wheezing or shortness of breath. 02/13/14   Nicole Pisciotta, PA-C  amoxicillin-clavulanate (AUGMENTIN) 875-125 MG per tablet Take 1 tablet by mouth every 12 (twelve) hours. Take for 6 days then stop. 06/11/14   Eugenie Filler, MD  aspirin 81 MG chewable tablet Chew 1 tablet (81 mg total) by mouth daily. 11/23/13   Kristen N Arrey, DO  atorvastatin (LIPITOR) 80 MG tablet Take 1 tablet (80 mg total) by mouth daily. Patient taking differently: Take 80 mg by mouth at bedtime.  02/13/14   Nicole Pisciotta, PA-C  benztropine (COGENTIN) 0.5 MG tablet Take 0.5 mg by mouth 2 (two) times daily.    Historical Provider, MD  clopidogrel (PLAVIX) 75 MG tablet Take 1 tablet (75 mg total) by mouth daily. 02/13/14   Nicole Pisciotta, PA-C  divalproex (DEPAKOTE) 500 MG DR  tablet Take 1 tablet (500 mg total) by mouth 2 (two) times daily. 02/13/14   Nicole Pisciotta, PA-C  famotidine (PEPCID) 20 MG tablet Take 1 tablet (20 mg total) by mouth 2 (two) times daily. 02/13/14   Nicole Pisciotta, PA-C  feeding supplement, ENSURE ENLIVE, (ENSURE ENLIVE) LIQD Take 237 mLs by mouth 2 (two) times daily between meals. 06/11/14   Eugenie Filler, MD  fluticasone (FLONASE) 50 MCG/ACT  nasal spray Place 2 sprays into both nostrils daily. Use for 1 week, then as needed. 06/11/14   Eugenie Filler, MD  folic acid (FOLVITE) 1 MG tablet Take 1 tablet (1 mg total) by mouth daily. 06/11/14   Eugenie Filler, MD  furosemide (LASIX) 40 MG tablet Take 1 tablet (40 mg total) by mouth daily. 02/13/14   Nicole Pisciotta, PA-C  guaiFENesin (MUCINEX) 600 MG 12 hr tablet Take 1 tablet (600 mg total) by mouth 2 (two) times daily. Take for 6 days then stop. 06/11/14   Eugenie Filler, MD  haloperidol decanoate (HALDOL DECANOATE) 100 MG/ML injection Inject 1 mL into the muscle every 28 (twenty-eight) days. 05/24/14   Historical Provider, MD  hydrALAZINE (APRESOLINE) 10 MG tablet Take 1 tablet (10 mg total) by mouth 2 (two) times daily. 02/13/14   Nicole Pisciotta, PA-C  levETIRAcetam (KEPPRA) 250 MG tablet Take 5 tablets (1,250 mg total) by mouth 2 (two) times daily. 06/18/14   Everlene Balls, MD  lisinopril (ZESTRIL) 2.5 MG tablet Take 1 tablet (2.5 mg total) by mouth daily. 02/13/14   Nicole Pisciotta, PA-C  LORazepam (ATIVAN) 0.5 MG tablet Take 0.5 mg by mouth at bedtime as needed for anxiety.    Historical Provider, MD  meloxicam (MOBIC) 15 MG tablet Take 1 tablet (15 mg total) by mouth daily. 11/13/13   Kaitlyn Szekalski, PA-C  metoprolol tartrate (LOPRESSOR) 25 MG tablet Take 0.5 tablets (12.5 mg total) by mouth 2 (two) times daily. Patient taking differently: Take 25 mg by mouth daily.  02/13/14   Nicole Pisciotta, PA-C  Multiple Vitamin (MULTIVITAMIN WITH MINERALS) TABS tablet Take 1 tablet by mouth daily. 06/11/14   Eugenie Filler, MD  oxyCODONE (ROXICODONE) 5 MG immediate release tablet Take one tablet by mouth twice daily for pain Patient taking differently: Take 5 mg by mouth daily as needed for severe pain. Take one tablet by mouth twice daily for pain 10/01/13   Tiffany L Reed, DO  QUEtiapine (SEROQUEL) 50 MG tablet Take 1 tablet (50 mg total) by mouth at bedtime. 02/13/14   Nicole Pisciotta,  PA-C  thiamine 100 MG tablet Take 1 tablet (100 mg total) by mouth daily. 06/11/14   Eugenie Filler, MD  traZODone (DESYREL) 100 MG tablet Take 1 tablet by mouth at bedtime as needed for sleep.  05/13/14   Historical Provider, MD     Allergies  Allergen Reactions  . Hctz [Hydrochlorothiazide] Other (See Comments)    Dizzy spells  . Hydroxyzine Hives  . Sulfonamide Derivatives Hives  . Cetirizine & Related Other (See Comments)    unknown    Social History:  reports that he has been smoking Cigarettes.  He has been smoking about 0.00 packs per day. He does not have any smokeless tobacco history on file. He reports that he drinks alcohol. He reports that he uses illicit drugs (Marijuana).    Family History  Problem Relation Age of Onset  . Malignant hyperthermia Mother   . Cirrhosis Father   . Alcohol abuse Father  Physical Exam:  GEN:  Pleasant Thin 49 y.o. Caucasian male examined and in no acute distress; cooperative with exam Filed Vitals:   06/30/14 1830 06/30/14 1900 06/30/14 1930 06/30/14 2034  BP: 121/72 128/81 99/58 96/56   Pulse: 56 60 54 51  Temp:      TempSrc:      Resp:    14  Height:      Weight:      SpO2: 100% 100% 100% 97%   Blood pressure 96/56, pulse 51, temperature 98.3 F (36.8 C), temperature source Oral, resp. rate 14, height 6\' 1"  (1.854 m), weight 63.368 kg (139 lb 11.2 oz), SpO2 97 %. PSYCH: He is alert and oriented x4; does not appear anxious does not appear depressed; affect is normal HEENT: Normocephalic and Atraumatic, Mucous membranes pink; PERRLA; EOM intact; Fundi:  Benign;  No scleral icterus, Nares: Patent, Oropharynx: Clear, Edentulous,    Neck:  FROM, No Cervical Lymphadenopathy nor Thyromegaly or Carotid Bruit; No JVD; Breasts:: Not examined CHEST WALL: No tenderness CHEST: Normal respiration, clear to auscultation bilaterally HEART: Bradycardic and  Regular rate ; no murmurs rubs or gallops BACK: No kyphosis or scoliosis; No CVA  tenderness ABDOMEN: Positive Bowel Sounds, Scaphoid,  Soft +Tenderness of Epigastrium, No Rebound or Guarding; No Masses, No Organomegaly, Rectal Exam: Not done EXTREMITIES: No Cyanosis, Clubbing, or Edema; No Ulcerations. Genitalia: not examined PULSES: 2+ and symmetric SKIN: Numerous and Diffuse pinpoint ulcers and excoriations on BLEs Thorax and BUEs.    CNS:  Alert and Oriented x 4, No Focal Deficits Vascular: pulses palpable throughout    Labs on Admission:  Basic Metabolic Panel:  Recent Labs Lab 06/30/14 1752  NA 138  K 3.6  CL 104  CO2 26  GLUCOSE 141*  BUN <5*  CREATININE 0.70  CALCIUM 8.7*   Liver Function Tests:  Recent Labs Lab 06/30/14 1752  AST 14*  ALT 10*  ALKPHOS 47  BILITOT 0.5  PROT 6.6  ALBUMIN 3.7    Recent Labs Lab 06/30/14 1752  LIPASE 716*   No results for input(s): AMMONIA in the last 168 hours. CBC:  Recent Labs Lab 06/30/14 1752  WBC 5.1  NEUTROABS 2.6  HGB 12.9*  HCT 37.4*  MCV 91.9  PLT 244   Cardiac Enzymes: No results for input(s): CKTOTAL, CKMB, CKMBINDEX, TROPONINI in the last 168 hours.  BNP (last 3 results)  Recent Labs  05/01/14 2153 06/06/14 2206  BNP 21.0 28.5    ProBNP (last 3 results) No results for input(s): PROBNP in the last 8760 hours.  CBG: No results for input(s): GLUCAP in the last 168 hours.  Radiological Exams on Admission: No results found.   EKG: Independently reviewed. Sinus Bradycardia rate =38       Assessment/Plan:     49 y.o. male with  Principal Problem:   1.   Acute pancreatitis   Pain Control   IV Protonix   IV Anti-Emetics PRN   Monitor Lipase Levels     Active Problems:  2.    Hypotension   IVFs   Hold Anti-Hypertensives   Monitor BPs     3.   Abdominal pain, epigastric- due to #1   Pain Control PRN      4.   Alcohol abuse   CIWA Protocol with IV Ativan     5.   Anemia    Monitor Hb Levels   Anemia Panel Sent     6.   Infestation ( Bed  Bug)/Insect  Bites   Isolation       7.   PARANOID SCHIZOPHRENIA, CHRONIC   Continue Seroquel, and Haldol and Cogentin Rx      8.   Essential hypertension- Hypotensive   Hold Anti-Hypertensives   Resume when BPs stabilize        9.  CAD (coronary artery disease)   Cardiac monitoring   Continue Atorvastatin , Plavix, and ASA Rx   10.  DVT Prophylaxis   Lovenox     Code Status:     FULL CODE        Family Communication:   No Family Present    Disposition Plan:    Inpatient Status        Time spent:  [facility] Pager [phone]   If [place] Please Contact the Day Rounding Team MD for [facility]  If 7PM-7AM, Please Contact Night-Floor Coverage  [email or web address] Password [facility] 06/30/2014, 9:17 PM     ADDENDUM:   Patient was seen and examined on 06/30/2014

## 2014-06-30 NOTE — ED Provider Notes (Signed)
CSN: [id number]     Arrival date & time 06/30/14  1737 History   First MD Initiated Contact with Patient 06/30/14 1807     Chief Complaint  Patient presents with  . Abdominal Pain     (Consider location/radiation/quality/duration/timing/severity/associated sxs/prior Treatment) Patient is a 49 y.o. male presenting with abdominal pain. The history is provided by the patient.  Abdominal Pain Associated symptoms: no chest pain, no chills, no constipation, no cough, no diarrhea, no dysuria, no fever, no hematuria, no nausea, no shortness of breath, no sore throat and no vomiting   Patient w hx etoh abuse, pancreatitis, presents c/o mid abd and epigastric pain in the past 2 days. Pain dull, constant, non radiating, mod-severe.  No specific exacerbating or alleviating factors. Pt unsure whether similar to his prior abd pain. No nv. Had normal bm today. No back or flank pain. No fever or chills. No abd distension. Denies prior abd surgery. Denies hx pud or gallstones. No fever or chills.      Past Medical History  Diagnosis Date  . Hypertension   . Asthma   . GERD (gastroesophageal reflux disease)   . Chronic pain   . Schizophrenia   . Alcohol abuse   . Seizures   . Pancreatitis   . Depression   . CAD (coronary artery disease)    Past Surgical History  Procedure Laterality Date  . Left heart catheterization with coronary angiogram N/A 07/06/2013    Procedure: LEFT HEART CATHETERIZATION WITH CORONARY ANGIOGRAM;  Surgeon: Clent Demark, MD;  Location: [facility] CATH LAB;  Service: Cardiovascular;  Laterality: N/A;  . Percutaneous coronary stent intervention (pci-s)  07/06/2013    Procedure: PERCUTANEOUS CORONARY STENT INTERVENTION (PCI-S);  Surgeon: Clent Demark, MD;  Location: [facility] CATH LAB;  Service: Cardiovascular;;   Family History  Problem Relation Age of Onset  . Malignant hyperthermia Mother   . Cirrhosis Father   . Alcohol abuse Father    History  Substance Use Topics  . Smoking  status: Current Every Day Smoker -- 0.00 packs/day    Types: Cigarettes  . Smokeless tobacco: Not on file  . Alcohol Use: Yes     Comment: daily heavy drinker    Review of Systems  Constitutional: Negative for fever and chills.  HENT: Negative for sore throat.   Eyes: Negative for redness.  Respiratory: Negative for cough and shortness of breath.   Cardiovascular: Negative for chest pain.  Gastrointestinal: Positive for abdominal pain. Negative for nausea, vomiting, diarrhea and constipation.  Endocrine: Negative for polyuria.  Genitourinary: Negative for dysuria, hematuria and flank pain.  Musculoskeletal: Negative for back pain and neck pain.  Skin: Negative for rash.  Neurological: Negative for headaches.  Hematological: Does not bruise/bleed easily.  Psychiatric/Behavioral: Negative for confusion.      Allergies  Hctz; Hydroxyzine; Sulfonamide derivatives; and Cetirizine & related  Home Medications   Prior to Admission medications   Medication Sig Start Date End Date Taking? Authorizing Provider  albuterol (PROVENTIL HFA;VENTOLIN HFA) 108 (90 BASE) MCG/ACT inhaler Inhale 2 puffs into the lungs every 4 (four) hours as needed for wheezing or shortness of breath. 02/13/14   Nicole Pisciotta, PA-C  amoxicillin-clavulanate (AUGMENTIN) 875-125 MG per tablet Take 1 tablet by mouth every 12 (twelve) hours. Take for 6 days then stop. 06/11/14   Eugenie Filler, MD  aspirin 81 MG chewable tablet Chew 1 tablet (81 mg total) by mouth daily. 11/23/13   Kristen N Reichardt, DO  atorvastatin (LIPITOR) 80  MG tablet Take 1 tablet (80 mg total) by mouth daily. Patient taking differently: Take 80 mg by mouth at bedtime.  02/13/14   Nicole Pisciotta, PA-C  benztropine (COGENTIN) 0.5 MG tablet Take 0.5 mg by mouth 2 (two) times daily.    Historical Provider, MD  clopidogrel (PLAVIX) 75 MG tablet Take 1 tablet (75 mg total) by mouth daily. 02/13/14   Nicole Pisciotta, PA-C  divalproex (DEPAKOTE) 500 MG  DR tablet Take 1 tablet (500 mg total) by mouth 2 (two) times daily. 02/13/14   Nicole Pisciotta, PA-C  famotidine (PEPCID) 20 MG tablet Take 1 tablet (20 mg total) by mouth 2 (two) times daily. 02/13/14   Nicole Pisciotta, PA-C  feeding supplement, ENSURE ENLIVE, (ENSURE ENLIVE) LIQD Take 237 mLs by mouth 2 (two) times daily between meals. 06/11/14   Eugenie Filler, MD  fluticasone (FLONASE) 50 MCG/ACT nasal spray Place 2 sprays into both nostrils daily. Use for 1 week, then as needed. 06/11/14   Eugenie Filler, MD  folic acid (FOLVITE) 1 MG tablet Take 1 tablet (1 mg total) by mouth daily. 06/11/14   Eugenie Filler, MD  furosemide (LASIX) 40 MG tablet Take 1 tablet (40 mg total) by mouth daily. 02/13/14   Nicole Pisciotta, PA-C  guaiFENesin (MUCINEX) 600 MG 12 hr tablet Take 1 tablet (600 mg total) by mouth 2 (two) times daily. Take for 6 days then stop. 06/11/14   Eugenie Filler, MD  haloperidol decanoate (HALDOL DECANOATE) 100 MG/ML injection Inject 1 mL into the muscle every 28 (twenty-eight) days. 05/24/14   Historical Provider, MD  hydrALAZINE (APRESOLINE) 10 MG tablet Take 1 tablet (10 mg total) by mouth 2 (two) times daily. 02/13/14   Nicole Pisciotta, PA-C  levETIRAcetam (KEPPRA) 250 MG tablet Take 5 tablets (1,250 mg total) by mouth 2 (two) times daily. 06/18/14   Everlene Balls, MD  lisinopril (ZESTRIL) 2.5 MG tablet Take 1 tablet (2.5 mg total) by mouth daily. 02/13/14   Nicole Pisciotta, PA-C  LORazepam (ATIVAN) 0.5 MG tablet Take 0.5 mg by mouth at bedtime as needed for anxiety.    Historical Provider, MD  meloxicam (MOBIC) 15 MG tablet Take 1 tablet (15 mg total) by mouth daily. 11/13/13   Kaitlyn Szekalski, PA-C  metoprolol tartrate (LOPRESSOR) 25 MG tablet Take 0.5 tablets (12.5 mg total) by mouth 2 (two) times daily. Patient taking differently: Take 25 mg by mouth daily.  02/13/14   Nicole Pisciotta, PA-C  Multiple Vitamin (MULTIVITAMIN WITH MINERALS) TABS tablet Take 1 tablet by mouth  daily. 06/11/14   Eugenie Filler, MD  oxyCODONE (ROXICODONE) 5 MG immediate release tablet Take one tablet by mouth twice daily for pain Patient taking differently: Take 5 mg by mouth daily as needed for severe pain. Take one tablet by mouth twice daily for pain 10/01/13   Tiffany L Reed, DO  QUEtiapine (SEROQUEL) 50 MG tablet Take 1 tablet (50 mg total) by mouth at bedtime. 02/13/14   Nicole Pisciotta, PA-C  thiamine 100 MG tablet Take 1 tablet (100 mg total) by mouth daily. 06/11/14   Eugenie Filler, MD  traZODone (DESYREL) 100 MG tablet Take 1 tablet by mouth at bedtime as needed for sleep.  05/13/14   Historical Provider, MD   BP 129/87 mmHg  Pulse 71  Temp(Src) 98.3 F (36.8 C) (Oral)  Resp 18  Ht 6\' 1"  (1.854 m)  Wt 139 lb 11.2 oz (63.368 kg)  BMI 18.44 kg/m2  SpO2 100% Physical Exam  Constitutional: He is oriented to person, place, and time. He appears well-developed and well-nourished. No distress.  HENT:  Mouth/Throat: Oropharynx is clear and moist.  Eyes: Conjunctivae are normal.  Neck: Neck supple. No tracheal deviation present.  Cardiovascular: Normal rate, regular rhythm, normal heart sounds and intact distal pulses.  Exam reveals no gallop and no friction rub.   No murmur heard. Pulmonary/Chest: Effort normal and breath sounds normal. No accessory muscle usage. No respiratory distress.  Abdominal: Soft. Bowel sounds are normal. He exhibits no distension and no mass. There is tenderness. There is no rebound and no guarding.  Epigastric tenderness, no rebound or guarding.     Genitourinary:  No cva tenderness  Musculoskeletal: Normal range of motion. He exhibits no edema or tenderness.  Neurological: He is alert and oriented to person, place, and time.  Skin: Skin is warm and dry. He is not diaphoretic.  Psychiatric: He has a normal mood and affect.  Nursing note and vitals reviewed.   ED Course  Procedures (including critical care time) Labs Review   Results for  orders placed or performed during the hospital encounter of 06/30/14  CBC with Differential  Result Value Ref Range   WBC 5.1 4.0 - 10.5 K/uL   RBC 4.07 (L) 4.22 - 5.81 MIL/uL   Hemoglobin 12.9 (L) 13.0 - 17.0 g/dL   HCT 37.4 (L) 39.0 - 52.0 %   MCV 91.9 78.0 - 100.0 fL   MCH 31.7 26.0 - 34.0 pg   MCHC 34.5 30.0 - 36.0 g/dL   RDW 14.5 11.5 - 15.5 %   Platelets 244 150 - 400 K/uL   Neutrophils Relative % 52 43 - 77 %   Lymphocytes Relative 35 12 - 46 %   Monocytes Relative 8 3 - 12 %   Eosinophils Relative 2 0 - 5 %   Basophils Relative 3 (H) 0 - 1 %   Neutro Abs 2.6 1.7 - 7.7 K/uL   Lymphs Abs 1.8 0.7 - 4.0 K/uL   Monocytes Absolute 0.4 0.1 - 1.0 K/uL   Eosinophils Absolute 0.1 0.0 - 0.7 K/uL   Basophils Absolute 0.2 (H) 0.0 - 0.1 K/uL   Smear Review MORPHOLOGY UNREMARKABLE   Comprehensive metabolic panel  Result Value Ref Range   Sodium 138 135 - 145 mmol/L   Potassium 3.6 3.5 - 5.1 mmol/L   Chloride 104 101 - 111 mmol/L   CO2 26 22 - 32 mmol/L   Glucose, Bld 141 (H) 65 - 99 mg/dL   BUN <5 (L) 6 - 20 mg/dL   Creatinine, Ser 0.70 0.61 - 1.24 mg/dL   Calcium 8.7 (L) 8.9 - 10.3 mg/dL   Total Protein 6.6 6.5 - 8.1 g/dL   Albumin 3.7 3.5 - 5.0 g/dL   AST 14 (L) 15 - 41 U/L   ALT 10 (L) 17 - 63 U/L   Alkaline Phosphatase 47 38 - 126 U/L   Total Bilirubin 0.5 0.3 - 1.2 mg/dL   GFR calc non Af Amer >60 >60 mL/min   GFR calc Af Amer >60 >60 mL/min   Anion gap 8 5 - 15  Lipase, blood  Result Value Ref Range   Lipase 716 (H) 22 - 51 U/L  Urinalysis, Routine w reflex microscopic (not at [facility])  Result Value Ref Range   Color, Urine YELLOW YELLOW   APPearance CLEAR CLEAR   Specific Gravity, Urine 1.006 1.005 - 1.030   pH 6.5 5.0 - 8.0   Glucose, UA  NEGATIVE NEGATIVE mg/dL   Hgb urine dipstick NEGATIVE NEGATIVE   Bilirubin Urine NEGATIVE NEGATIVE   Ketones, ur NEGATIVE NEGATIVE mg/dL   Protein, ur NEGATIVE NEGATIVE mg/dL   Urobilinogen, UA 1.0 0.0 - 1.0 mg/dL   Nitrite  NEGATIVE NEGATIVE   Leukocytes, UA NEGATIVE NEGATIVE  Ethanol  Result Value Ref Range   Alcohol, Ethyl (B) <5 <5 mg/dL     MDM   Iv ns bolus. protonix iv. zofran iv. Dilaudid iv.   Reviewed nursing notes and prior charts for additional history.   Recheck, pain improved but persists. Persistent epigastric tenderness. Nausea.  Ns bolus. Dilaudid iv. zofran iv.  Medical service called for admission.      Lajean Saver, MD 06/30/14 [phone]

## 2014-06-30 NOTE — ED Notes (Signed)
Pt additionally reports pain from bed bugs.

## 2014-06-30 NOTE — ED Notes (Signed)
Admitting MD at BS.  

## 2014-06-30 NOTE — ED Notes (Signed)
Pt reports generalized abdominal pain since yesterday. Denies nvd. Reports large bm yesterday.

## 2014-07-01 DIAGNOSIS — D649 Anemia, unspecified: Secondary | ICD-10-CM | POA: Diagnosis present

## 2014-07-01 DIAGNOSIS — K852 Alcohol induced acute pancreatitis: Principal | ICD-10-CM

## 2014-07-01 DIAGNOSIS — R1013 Epigastric pain: Secondary | ICD-10-CM | POA: Diagnosis present

## 2014-07-01 DIAGNOSIS — F1010 Alcohol abuse, uncomplicated: Secondary | ICD-10-CM

## 2014-07-01 DIAGNOSIS — F200 Paranoid schizophrenia: Secondary | ICD-10-CM

## 2014-07-01 LAB — VITAMIN B12: Vitamin B-12: 303 pg/mL (ref 180–914)

## 2014-07-01 LAB — BASIC METABOLIC PANEL
ANION GAP: 3 — AB (ref 5–15)
BUN: 6 mg/dL (ref 6–20)
CHLORIDE: 115 mmol/L — AB (ref 101–111)
CO2: 26 mmol/L (ref 22–32)
CREATININE: 0.58 mg/dL — AB (ref 0.61–1.24)
Calcium: 8.5 mg/dL — ABNORMAL LOW (ref 8.9–10.3)
GFR calc Af Amer: >60 mL/min (ref >60)
GFR calc non Af Amer: >60 mL/min (ref >60)
Glucose, Bld: 82 mg/dL (ref 65–99)
POTASSIUM: 4.1 mmol/L (ref 3.5–5.1)
SODIUM: 144 mmol/L (ref 135–145)

## 2014-07-01 LAB — IRON AND TIBC
Iron: 58 ug/dL (ref 45–182)
SATURATION RATIOS: 25 % (ref 17.9–39.5)
TIBC: 230 ug/dL — AB (ref 250–450)
UIBC: 172 ug/dL

## 2014-07-01 LAB — RETICULOCYTES
RBC.: 3.57 MIL/uL — ABNORMAL LOW (ref 4.22–5.81)
RETIC CT PCT: 1.4 % (ref 0.4–3.1)
Retic Count, Absolute: 50 10*3/uL (ref 19.0–186.0)

## 2014-07-01 LAB — CBC
HCT: 35 % — ABNORMAL LOW (ref 39.0–52.0)
HEMOGLOBIN: 11.5 g/dL — AB (ref 13.0–17.0)
MCH: 30.7 pg (ref 26.0–34.0)
MCHC: 32.9 g/dL (ref 30.0–36.0)
MCV: 93.6 fL (ref 78.0–100.0)
Platelets: 199 10*3/uL (ref 150–400)
RBC: 3.74 MIL/uL — ABNORMAL LOW (ref 4.22–5.81)
RDW: 14.7 % (ref 11.5–15.5)
WBC: 4.3 10*3/uL (ref 4.0–10.5)

## 2014-07-01 LAB — FERRITIN: Ferritin: 51 ng/mL (ref 24–336)

## 2014-07-01 LAB — MRSA PCR SCREENING: MRSA BY PCR: NEGATIVE

## 2014-07-01 LAB — FOLATE: FOLATE: 8.4 ng/mL (ref >5.9)

## 2014-07-01 MED ORDER — LEVETIRACETAM 750 MG PO TABS
1250.0000 mg | ORAL_TABLET | Freq: Two times a day (BID) | ORAL | Status: DC
  Administered 2014-07-01 – 2014-07-03 (×5): 1250 mg via ORAL
  Filled 2014-07-01 (×10): qty 1

## 2014-07-01 MED ORDER — ASPIRIN 81 MG PO CHEW
81.0000 mg | CHEWABLE_TABLET | Freq: Every day | ORAL | Status: DC
  Administered 2014-07-01 – 2014-07-03 (×3): 81 mg via ORAL
  Filled 2014-07-01 (×3): qty 1

## 2014-07-01 NOTE — Progress Notes (Signed)
Pt hr is going down to 32 to 40's paged NP on call no awnser will page Day shift MD called and spoke to Dr. Carles Collet who gave new orders will continue to monitor.

## 2014-07-01 NOTE — Progress Notes (Signed)
Pt HR 40's in ed VSS, md aware will continue to monitor

## 2014-07-01 NOTE — Clinical Social Work Note (Signed)
CSW Consult Acknowledged:   CSW received a consult for substance abuse. CSW reviewed chart, pt's toxicology report is negative this admission. Pt declined substance abuse resources.   CSW received a call from the pt's brother Edd Arbour. Ronnie asked if he can speak to the pt's nurse. CSW provided [place] with the unit's phone number. CSW confirmed that the pt lives at home with Edd Arbour and his sister Letta Median. At this time there are no identified needs. CSW will sign off.         [place], MSW, [place]

## 2014-07-01 NOTE — Progress Notes (Signed)
PROGRESS NOTE  Chase Blackwell GBT:[id number] DOB: 12-27-1965 DOA: 06/30/2014 PCP: [facility]  Brief history 49 year old male with a history of coronary artery disease, hypertension, paranoid schizophrenia, alcohol dependence, seizure disorder presents with 2 days of abdominal pain with associated nausea and vomiting. Workup in the emergency department reveals a lipase of 716. The patient was admitted and treated for alcohol-induced pancreatitis. The patient states that he is continuing to drink beer and wine, although he is unable to quantify for me how much. He last drank alcohol approximately 2-3 days prior to admission. He also endorses smoking marijuana but denies any other illegal substances. He denies any new medications. He states that his brother with whom he lives has been helping him with his medications. Assessment/Plan: Acute pancreatitis -Secondary to alcohol use -06/11/2014 triglycerides 29 -Continue IV fluids -Symptomatic management with antiemetics and pain control Alcohol dependence -CIWA protocol Hypertension -Discontinue lisinopril, metoprolol tartrate As blood pressure remains soft Paranoid schizophrenia  -Continue benztropine, haloperidol decanoate, and Seroquel  Coronary artery disease  -No chest pain presently  -Continue aspirin, Lipitor, Plavix  Seizure disorder  -Continue Keppra and Depakote  -Monitor for any worsening pancreatitis in the setting of Depakote use  Sinus bradycardia  -No sinus pauses noted; no AV block -Continue to monitor on telemetry   Normocytic anemia  -Stable for outpatient workup   Family Communication:   Pt at beside Disposition Plan:   Home when medically stable, transfer to med tele      Procedures/Studies: Dg Chest 2 View  06/18/2014   CLINICAL DATA:  Acute onset of altered mental status. Seizure. High blood pressure. Initial encounter.  EXAM: CHEST  2 VIEW  COMPARISON:  Chest radiograph performed 06/09/2014   FINDINGS: The lungs are well-aerated. Peribronchial thickening is noted. Minimal bibasilar atelectasis noted. There is no evidence of pleural effusion or pneumothorax.  The heart is normal in size; the mediastinal contour is within normal limits. No acute osseous abnormalities are seen.  IMPRESSION: Peribronchial thickening noted.  Minimal bibasilar atelectasis seen.   Electronically Signed   By: Garald Balding M.D.   On: 06/18/2014 01:43   Dg Chest 2 View  06/09/2014   CLINICAL DATA:  Seizure today, history asthma, hypertension, smoking, GERD, alcohol abuse, schizophrenia, pancreatitis, coronary artery disease  EXAM: CHEST  2 VIEW  COMPARISON:  06/06/2014  FINDINGS: Normal heart size, mediastinal contours, and pulmonary vascularity.  Lungs hyperinflated but clear.  No pleural effusion or pneumothorax.  Bones demineralized.  IMPRESSION: Hyperinflation without acute infiltrate.   Electronically Signed   By: Lavonia Dana M.D.   On: 06/09/2014 18:01   Dg Chest 2 View  06/06/2014   CLINICAL DATA:  Central chest pain with shortness of breath and nausea since this evening.  EXAM: CHEST  2 VIEW  COMPARISON:  05/01/2014  FINDINGS: Lungs are hyperinflated, unchanged. The cardiomediastinal contours are normal. Pulmonary vasculature is normal. No consolidation, pleural effusion, or pneumothorax. No acute osseous abnormalities are seen.  IMPRESSION: No acute pulmonary process.   Electronically Signed   By: Jeb Levering M.D.   On: 06/06/2014 22:32   Ct Head Wo Contrast  06/18/2014   CLINICAL DATA:  Found after suspected seizure. Semi-responsive. Laceration on the nose. Concern for head or cervical spine injury. Initial encounter.  EXAM: CT HEAD WITHOUT CONTRAST  CT CERVICAL SPINE WITHOUT CONTRAST  TECHNIQUE: Multidetector CT imaging of the head and cervical spine was performed following the standard protocol  without intravenous contrast. Multiplanar CT image reconstructions of the cervical spine were also generated.   COMPARISON:  CT of the head performed 06/09/2014, and MRI of the brain performed 07/11/2013  FINDINGS: CT HEAD FINDINGS  There is no evidence of acute infarction, mass lesion, or intra- or extra-axial hemorrhage on CT.  Prominence of the sulci suggests mild cortical volume loss. Mild cerebellar atrophy is noted.  The brainstem and fourth ventricle are within normal limits. The basal ganglia are unremarkable in appearance. The cerebral hemispheres demonstrate grossly normal gray-white differentiation. No mass effect or midline shift is seen.  There is no evidence of fracture; visualized osseous structures are unremarkable in appearance. The orbits are within normal limits. The paranasal sinuses and mastoid air cells are well-aerated. No significant soft tissue abnormalities are seen.  CT CERVICAL SPINE FINDINGS  There is no evidence of fracture or subluxation. Vertebral bodies demonstrate normal height and alignment. Intervertebral disc spaces are preserved. A few anterior and posterior disc osteophyte complexes are noted along the lower cervical spine. Prevertebral soft tissues are within normal limits. The visualized neural foramina are grossly unremarkable.  The thyroid gland is unremarkable in appearance. Minimal blebs are seen at the right lung apex. No significant soft tissue abnormalities are seen.  IMPRESSION: 1. No evidence of traumatic intracranial injury or fracture. 2. No evidence of fracture or subluxation along the cervical spine. 3. Minimal degenerative change at the lower cervical spine. 4. Mild cortical volume loss noted. 5. Minimal blebs at the lung apex.   Electronically Signed   By: Garald Balding M.D.   On: 06/18/2014 01:57   Ct Head Wo Contrast  06/09/2014   CLINICAL DATA:  Unwitnessed seizure at home. Found postictal. History of seizures.  EXAM: CT HEAD WITHOUT CONTRAST  TECHNIQUE: Contiguous axial images were obtained from the base of the skull through the vertex without intravenous  contrast.  COMPARISON:  04/11/2014.  FINDINGS: Premature for age cerebral and cerebellar atrophy. This affects the temporal lobes particularly. No visible acute stroke, hemorrhage, intra-axial mass lesion, inflammatory process, or extra-axial fluid. Calvarium intact. LEFT maxillary sinus acute air-fluid level with foamy secretions. Negative orbits. No mastoid fluid. Similar appearance to priors.  IMPRESSION: Premature atrophy. No acute or focal intracranial abnormality. No interval change most recent priors.  Acute LEFT maxillary sinusitis.   Electronically Signed   By: Rolla Flatten M.D.   On: 06/09/2014 18:07   Ct Cervical Spine Wo Contrast  06/18/2014   CLINICAL DATA:  Found after suspected seizure. Semi-responsive. Laceration on the nose. Concern for head or cervical spine injury. Initial encounter.  EXAM: CT HEAD WITHOUT CONTRAST  CT CERVICAL SPINE WITHOUT CONTRAST  TECHNIQUE: Multidetector CT imaging of the head and cervical spine was performed following the standard protocol without intravenous contrast. Multiplanar CT image reconstructions of the cervical spine were also generated.  COMPARISON:  CT of the head performed 06/09/2014, and MRI of the brain performed 07/11/2013  FINDINGS: CT HEAD FINDINGS  There is no evidence of acute infarction, mass lesion, or intra- or extra-axial hemorrhage on CT.  Prominence of the sulci suggests mild cortical volume loss. Mild cerebellar atrophy is noted.  The brainstem and fourth ventricle are within normal limits. The basal ganglia are unremarkable in appearance. The cerebral hemispheres demonstrate grossly normal gray-white differentiation. No mass effect or midline shift is seen.  There is no evidence of fracture; visualized osseous structures are unremarkable in appearance. The orbits are within normal limits. The paranasal sinuses and mastoid air  cells are well-aerated. No significant soft tissue abnormalities are seen.  CT CERVICAL SPINE FINDINGS  There is no  evidence of fracture or subluxation. Vertebral bodies demonstrate normal height and alignment. Intervertebral disc spaces are preserved. A few anterior and posterior disc osteophyte complexes are noted along the lower cervical spine. Prevertebral soft tissues are within normal limits. The visualized neural foramina are grossly unremarkable.  The thyroid gland is unremarkable in appearance. Minimal blebs are seen at the right lung apex. No significant soft tissue abnormalities are seen.  IMPRESSION: 1. No evidence of traumatic intracranial injury or fracture. 2. No evidence of fracture or subluxation along the cervical spine. 3. Minimal degenerative change at the lower cervical spine. 4. Mild cortical volume loss noted. 5. Minimal blebs at the lung apex.   Electronically Signed   By: Garald Balding M.D.   On: 06/18/2014 01:57   Dg Chest Portable 1 View  06/06/2014   CLINICAL DATA:  Chest pain and shortness of breath for 1 day.  EXAM: PORTABLE CHEST - 1 VIEW  COMPARISON:  Earlier this day at 2233 hour  FINDINGS: The cardiomediastinal contours are normal. Unchanged hyperinflation. Pulmonary vasculature is normal. No consolidation, pleural effusion, or pneumothorax. No acute osseous abnormalities are seen.  IMPRESSION: No change from prior exam.   Electronically Signed   By: Jeb Levering M.D.   On: 06/06/2014 23:26         Subjective:  patient complains of epigastric pain, but states that it is a lot better than yesterday. He denies any nausea, vomiting, diarrhea, dysuria, hematuria. He denies any fevers, chills, chest pain, shortness of breath, coughing, hemoptysis.  Objective: Filed Vitals:   07/01/14 0335 07/01/14 0740 07/01/14 0848 07/01/14 1200  BP: 98/54 92/55 94/61  102/52  Pulse: 45 40 36 42  Temp: 97.7 F (36.5 C)  97.8 F (36.6 C) 98.2 F (36.8 C)  TempSrc: Axillary  Oral Oral  Resp: 14 11 13 16   Height:      Weight:      SpO2: 96% 97% 100% 100%    Intake/Output Summary (Last 24  hours) at 07/01/14 1200 Last data filed at 07/01/14 1100  Gross per 24 hour  Intake 2423.34 ml  Output    950 ml  Net 1473.34 ml   Weight change:  Exam:   General:  Pt is alert, follows commands appropriately, not in acute distress  HEENT: No icterus, No thrush, No meningismus, [place]/AT  Cardiovascular: RRR, S1/S2, no rubs, no gallops  Respiratory: CTA bilaterally, no wheezing, no crackles, no rhonchi  Abdomen: Soft/+BS, epigastric pain without any rebound, non distended, no guarding; no hepatosplenomegaly   Extremities: No edema, No lymphangitis, No petechiae, No rashes, no synovitis; no cyanosis or clubbing   Data Reviewed: Basic Metabolic Panel:  Recent Labs Lab 06/30/14 1752 07/01/14 0242  NA 138 144  K 3.6 4.1  CL 104 115*  CO2 26 26  GLUCOSE 141* 82  BUN <5* 6  CREATININE 0.70 0.58*  CALCIUM 8.7* 8.5*   Liver Function Tests:  Recent Labs Lab 06/30/14 1752  AST 14*  ALT 10*  ALKPHOS 47  BILITOT 0.5  PROT 6.6  ALBUMIN 3.7    Recent Labs Lab 06/30/14 1752  LIPASE 716*   No results for input(s): AMMONIA in the last 168 hours. CBC:  Recent Labs Lab 06/30/14 1752 07/01/14 0242  WBC 5.1 4.3  NEUTROABS 2.6  --   HGB 12.9* 11.5*  HCT 37.4* 35.0*  MCV 91.9 93.6  PLT 244 199   Cardiac Enzymes: No results for input(s): CKTOTAL, CKMB, CKMBINDEX, TROPONINI in the last 168 hours. BNP: Invalid input(s): POCBNP CBG: No results for input(s): GLUCAP in the last 168 hours.  Recent Results (from the past 240 hour(s))  MRSA PCR Screening     Status: None   Collection Time: 07/01/14  1:58 AM  Result Value Ref Range Status   MRSA by PCR NEGATIVE NEGATIVE Final    Comment:        The GeneXpert MRSA Assay (FDA approved for NASAL specimens only), is one component of a comprehensive MRSA colonization surveillance program. It is not intended to diagnose MRSA infection nor to guide or monitor treatment for MRSA infections.      Scheduled Meds: .  atorvastatin  80 mg Oral QHS  . benztropine  0.5 mg Oral BID  . clopidogrel  75 mg Oral Daily  . divalproex  500 mg Oral BID  . enoxaparin (LOVENOX) injection  40 mg Subcutaneous Q24H  . folic acid  1 mg Oral Daily  . haloperidol decanoate  100 mg Intramuscular Q28 days  . LORazepam  0-4 mg Intravenous Q6H   Followed by  . [START ON 07/02/2014] LORazepam  0-4 mg Intravenous Q12H  . multivitamin with minerals  1 tablet Oral Daily  . pantoprazole (PROTONIX) IV  40 mg Intravenous Q12H  . QUEtiapine  50 mg Oral QHS  . thiamine  100 mg Oral Daily   Or  . thiamine  100 mg Intravenous Daily   Continuous Infusions: . sodium chloride 125 mL/hr at 07/01/14 0805     Neilan Rizzo, DO  [facility] Pager [phone]  If 7PM-7AM, please contact night-coverage [email or web address] Password [email or web address] 07/01/2014, 12:00 PM   LOS: 1 day

## 2014-07-02 DIAGNOSIS — I10 Essential (primary) hypertension: Secondary | ICD-10-CM

## 2014-07-02 LAB — COMPREHENSIVE METABOLIC PANEL
ALK PHOS: 39 U/L (ref 38–126)
ALT: 7 U/L — ABNORMAL LOW (ref 17–63)
ANION GAP: 3 — AB (ref 5–15)
AST: 12 U/L — ABNORMAL LOW (ref 15–41)
Albumin: 2.8 g/dL — ABNORMAL LOW (ref 3.5–5.0)
BILIRUBIN TOTAL: 0.3 mg/dL (ref 0.3–1.2)
CHLORIDE: 111 mmol/L (ref 101–111)
CO2: 26 mmol/L (ref 22–32)
Calcium: 8 mg/dL — ABNORMAL LOW (ref 8.9–10.3)
Creatinine, Ser: 0.63 mg/dL (ref 0.61–1.24)
GFR calc non Af Amer: >60 mL/min (ref >60)
Glucose, Bld: 75 mg/dL (ref 65–99)
POTASSIUM: 3.8 mmol/L (ref 3.5–5.1)
Sodium: 140 mmol/L (ref 135–145)
Total Protein: 5 g/dL — ABNORMAL LOW (ref 6.5–8.1)

## 2014-07-02 LAB — LIPASE, BLOOD: LIPASE: 151 U/L — AB (ref 22–51)

## 2014-07-02 MED ORDER — HALOPERIDOL DECANOATE 100 MG/ML IM SOLN: 100.0000 mg | INTRAMUSCULAR | Status: DC

## 2014-07-02 MED ORDER — VITAMIN B-12 1000 MCG PO TABS
500.0000 ug | ORAL_TABLET | Freq: Every day | ORAL | Status: DC
  Administered 2014-07-02 – 2014-07-03 (×2): 500 ug via ORAL
  Filled 2014-07-02 (×2): qty 1

## 2014-07-02 MED ORDER — CYANOCOBALAMIN 500 MCG PO TABS: 500.0000 ug | ORAL_TABLET | Freq: Every day | ORAL | Status: AC

## 2014-07-02 NOTE — Progress Notes (Signed)
MEDICATION RELATED NOTE   Pharmacy Re:  Haldol  Assessment: 49 yo male who receives monthly Haldol injection.  I confirmed with [place] his last dose and they confirmed that he received 100mg  on June 26 2014.  Plan:  Added most recent injection to admission med list.  Rober Minion, PharmD., MS Clinical Pharmacist Pager:  [phone] Thank you for allowing pharmacy to be part of this patients care team. 07/02/2014,1:43 PM

## 2014-07-02 NOTE — Progress Notes (Signed)
Utilization review complete. Zykee Avakian RN CCM Case Mgmt phone [phone] 

## 2014-07-02 NOTE — Discharge Summary (Signed)
Physician Discharge Summary  Chase Blackwell ZOX:[id number] DOB: [birth year] DOA: 06/30/2014  PCP: [facility]  Admit date: 06/30/2014 Discharge date: 07/03/14 Recommendations for Outpatient Follow-up:  1. Pt will need to follow up with PCP in 2 weeks post discharge 2. Please obtain BMP in 1-2 weeks  Discharge Diagnoses:  Acute pancreatitis -Secondary to alcohol use -npo, bowel rest initially -06/11/2014 triglycerides 29 -Continued IV fluids -Symptomatic management with antiemetics and pain control -Gradually advance the patient's diet to soft which pt tolerated Alcohol dependence -CIWA protocol -B12 level borderline low-->supplement po Hypertension -Discontinue hydralazine, lisinopril, metoprolol tartrate as blood pressure remained soft -restart metoprolol tartrate and hydralazine after discharge  Paranoid schizophrenia  -Continue benztropine, haloperidol decanoate, and Seroquel  -Last Haldol decanoate injection on 06/26/14 Coronary artery disease  -No chest pain presently  -Continue aspirin, Lipitor, Plavix  Seizure disorder  -Continue Keppra 1250mg  bid and Depakote 500 mg bid -Monitor for any worsening pancreatitis in the setting of Depakote use  Sinus bradycardia  -No sinus pauses noted; no AV block -Continue to monitor on telemetry   Normocytic anemia  -Stable for outpatient workup   Discharge Condition: stable  Disposition: home  Diet:soft Wt Readings from Last 3 Encounters:  07/02/14 65.454 kg (144 lb 4.8 oz)  06/16/14 65.59 kg (144 lb 9.6 oz)  06/11/14 57.288 kg (126 lb 4.8 oz)    History of present illness:  49 year old male with a history of coronary artery disease, hypertension, paranoid schizophrenia, alcohol dependence, seizure disorder presents with 2 days of abdominal pain with associated nausea and vomiting. Workup in the emergency department reveals a lipase of 716. The patient was admitted and treated for alcohol-induced pancreatitis. The  patient states that he is continuing to drink beer and wine, although he is unable to quantify for me how much. He last drank alcohol approximately 2-3 days prior to admission. He also endorses smoking marijuana but denies any other illegal substances. He denies any new medications. He states that his brother with whom he lives has been helping him with his medications. The patient was initially made npo and started on IV fluids. The patient's vomiting and abdominal pain gradually improved. He was advanced to a soft diet which he ultimately tolerated. Alcohol cessation was discussed with the patient although he seems to have poor insight into the situation.  Discharge Exam: Filed Vitals:   07/02/14 1600  BP: 124/76  Pulse: 55  Temp:   Resp:    Filed Vitals:   07/02/14 0800 07/02/14 1200 07/02/14 1349 07/02/14 1600  BP: 119/72 139/91  124/76  Pulse: 48 58  55  Temp:   97.7 F (36.5 C)   TempSrc:   Oral   Resp:      Height:      Weight:      SpO2: 99% 100%  100%   General: A&O x 3, NAD, pleasant, cooperative Cardiovascular: RRR, no rub, no gallop, no S3 Respiratory: CTAB, no wheeze, no rhonchi Abdomen:soft, minimal periumbilical tenderness without rebound, nondistended, positive bowel sounds Extremities: No edema, No lymphangitis, no petechiae  Discharge Instructions     Medication List    STOP taking these medications        albuterol 108 (90 BASE) MCG/ACT inhaler  Commonly known as:  PROVENTIL HFA;VENTOLIN HFA     amoxicillin-clavulanate 875-125 MG per tablet  Commonly known as:  AUGMENTIN     fluticasone 50 MCG/ACT nasal spray  Commonly known as:  FLONASE     furosemide 40 MG  tablet  Commonly known as:  LASIX     guaiFENesin 600 MG 12 hr tablet  Commonly known as:  MUCINEX     lisinopril 2.5 MG tablet  Commonly known as:  ZESTRIL     meloxicam 15 MG tablet  Commonly known as:  MOBIC     oxyCODONE 5 MG immediate release tablet  Commonly known as:  ROXICODONE        TAKE these medications        aspirin 81 MG chewable tablet  Chew 1 tablet (81 mg total) by mouth daily.     atorvastatin 80 MG tablet  Commonly known as:  LIPITOR  Take 1 tablet (80 mg total) by mouth daily.     benztropine 0.5 MG tablet  Commonly known as:  COGENTIN  Take 0.5 mg by mouth 2 (two) times daily.     clopidogrel 75 MG tablet  Commonly known as:  PLAVIX  Take 1 tablet (75 mg total) by mouth daily.     cyanocobalamin 500 MCG tablet  Take 1 tablet (500 mcg total) by mouth daily.     divalproex 500 MG DR tablet  Commonly known as:  DEPAKOTE  Take 1 tablet (500 mg total) by mouth 2 (two) times daily.     famotidine 20 MG tablet  Commonly known as:  PEPCID  Take 1 tablet (20 mg total) by mouth 2 (two) times daily.     feeding supplement (ENSURE ENLIVE) Liqd  Take 237 mLs by mouth 2 (two) times daily between meals.     folic acid 1 MG tablet  Commonly known as:  FOLVITE  Take 1 tablet (1 mg total) by mouth daily.     haloperidol decanoate 100 MG/ML injection  Commonly known as:  HALDOL DECANOATE  Inject 1 mL into the muscle every 28 (twenty-eight) days.     hydrALAZINE 10 MG tablet  Commonly known as:  APRESOLINE  Take 1 tablet (10 mg total) by mouth 2 (two) times daily.     levETIRAcetam 250 MG tablet  Commonly known as:  KEPPRA  Take 5 tablets (1,250 mg total) by mouth 2 (two) times daily.     LORazepam 0.5 MG tablet  Commonly known as:  ATIVAN  Take 0.5 mg by mouth at bedtime as needed for anxiety.     metoprolol tartrate 25 MG tablet  Commonly known as:  LOPRESSOR  Take 0.5 tablets (12.5 mg total) by mouth 2 (two) times daily.     multivitamin with minerals Tabs tablet  Take 1 tablet by mouth daily.     QUEtiapine 50 MG tablet  Commonly known as:  SEROQUEL  Take 1 tablet (50 mg total) by mouth at bedtime.     thiamine 100 MG tablet  Take 1 tablet (100 mg total) by mouth daily.     traZODone 100 MG tablet  Commonly known as:  DESYREL   Take 1 tablet by mouth at bedtime as needed for sleep.         The results of significant diagnostics from this hospitalization (including imaging, microbiology, ancillary and laboratory) are listed below for reference.    Significant Diagnostic Studies: Dg Chest 2 View  06/18/2014   CLINICAL DATA:  Acute onset of altered mental status. Seizure. High blood pressure. Initial encounter.  EXAM: CHEST  2 VIEW  COMPARISON:  Chest radiograph performed 06/09/2014  FINDINGS: The lungs are well-aerated. Peribronchial thickening is noted. Minimal bibasilar atelectasis noted. There is no evidence of pleural  effusion or pneumothorax.  The heart is normal in size; the mediastinal contour is within normal limits. No acute osseous abnormalities are seen.  IMPRESSION: Peribronchial thickening noted.  Minimal bibasilar atelectasis seen.   Electronically Signed   By: Garald Balding M.D.   On: 06/18/2014 01:43   Dg Chest 2 View  06/09/2014   CLINICAL DATA:  Seizure today, history asthma, hypertension, smoking, GERD, alcohol abuse, schizophrenia, pancreatitis, coronary artery disease  EXAM: CHEST  2 VIEW  COMPARISON:  06/06/2014  FINDINGS: Normal heart size, mediastinal contours, and pulmonary vascularity.  Lungs hyperinflated but clear.  No pleural effusion or pneumothorax.  Bones demineralized.  IMPRESSION: Hyperinflation without acute infiltrate.   Electronically Signed   By: Lavonia Dana M.D.   On: 06/09/2014 18:01   Dg Chest 2 View  06/06/2014   CLINICAL DATA:  Central chest pain with shortness of breath and nausea since this evening.  EXAM: CHEST  2 VIEW  COMPARISON:  05/01/2014  FINDINGS: Lungs are hyperinflated, unchanged. The cardiomediastinal contours are normal. Pulmonary vasculature is normal. No consolidation, pleural effusion, or pneumothorax. No acute osseous abnormalities are seen.  IMPRESSION: No acute pulmonary process.   Electronically Signed   By: Jeb Levering M.D.   On: 06/06/2014 22:32   Ct  Head Wo Contrast  06/18/2014   CLINICAL DATA:  Found after suspected seizure. Semi-responsive. Laceration on the nose. Concern for head or cervical spine injury. Initial encounter.  EXAM: CT HEAD WITHOUT CONTRAST  CT CERVICAL SPINE WITHOUT CONTRAST  TECHNIQUE: Multidetector CT imaging of the head and cervical spine was performed following the standard protocol without intravenous contrast. Multiplanar CT image reconstructions of the cervical spine were also generated.  COMPARISON:  CT of the head performed 06/09/2014, and MRI of the brain performed 07/11/2013  FINDINGS: CT HEAD FINDINGS  There is no evidence of acute infarction, mass lesion, or intra- or extra-axial hemorrhage on CT.  Prominence of the sulci suggests mild cortical volume loss. Mild cerebellar atrophy is noted.  The brainstem and fourth ventricle are within normal limits. The basal ganglia are unremarkable in appearance. The cerebral hemispheres demonstrate grossly normal gray-white differentiation. No mass effect or midline shift is seen.  There is no evidence of fracture; visualized osseous structures are unremarkable in appearance. The orbits are within normal limits. The paranasal sinuses and mastoid air cells are well-aerated. No significant soft tissue abnormalities are seen.  CT CERVICAL SPINE FINDINGS  There is no evidence of fracture or subluxation. Vertebral bodies demonstrate normal height and alignment. Intervertebral disc spaces are preserved. A few anterior and posterior disc osteophyte complexes are noted along the lower cervical spine. Prevertebral soft tissues are within normal limits. The visualized neural foramina are grossly unremarkable.  The thyroid gland is unremarkable in appearance. Minimal blebs are seen at the right lung apex. No significant soft tissue abnormalities are seen.  IMPRESSION: 1. No evidence of traumatic intracranial injury or fracture. 2. No evidence of fracture or subluxation along the cervical spine. 3.  Minimal degenerative change at the lower cervical spine. 4. Mild cortical volume loss noted. 5. Minimal blebs at the lung apex.   Electronically Signed   By: Garald Balding M.D.   On: 06/18/2014 01:57   Ct Head Wo Contrast  06/09/2014   CLINICAL DATA:  Unwitnessed seizure at home. Found postictal. History of seizures.  EXAM: CT HEAD WITHOUT CONTRAST  TECHNIQUE: Contiguous axial images were obtained from the base of the skull through the vertex without intravenous contrast.  COMPARISON:  04/11/2014.  FINDINGS: Premature for age cerebral and cerebellar atrophy. This affects the temporal lobes particularly. No visible acute stroke, hemorrhage, intra-axial mass lesion, inflammatory process, or extra-axial fluid. Calvarium intact. LEFT maxillary sinus acute air-fluid level with foamy secretions. Negative orbits. No mastoid fluid. Similar appearance to priors.  IMPRESSION: Premature atrophy. No acute or focal intracranial abnormality. No interval change most recent priors.  Acute LEFT maxillary sinusitis.   Electronically Signed   By: Rolla Flatten M.D.   On: 06/09/2014 18:07   Ct Cervical Spine Wo Contrast  06/18/2014   CLINICAL DATA:  Found after suspected seizure. Semi-responsive. Laceration on the nose. Concern for head or cervical spine injury. Initial encounter.  EXAM: CT HEAD WITHOUT CONTRAST  CT CERVICAL SPINE WITHOUT CONTRAST  TECHNIQUE: Multidetector CT imaging of the head and cervical spine was performed following the standard protocol without intravenous contrast. Multiplanar CT image reconstructions of the cervical spine were also generated.  COMPARISON:  CT of the head performed 06/09/2014, and MRI of the brain performed 07/11/2013  FINDINGS: CT HEAD FINDINGS  There is no evidence of acute infarction, mass lesion, or intra- or extra-axial hemorrhage on CT.  Prominence of the sulci suggests mild cortical volume loss. Mild cerebellar atrophy is noted.  The brainstem and fourth ventricle are within normal  limits. The basal ganglia are unremarkable in appearance. The cerebral hemispheres demonstrate grossly normal gray-white differentiation. No mass effect or midline shift is seen.  There is no evidence of fracture; visualized osseous structures are unremarkable in appearance. The orbits are within normal limits. The paranasal sinuses and mastoid air cells are well-aerated. No significant soft tissue abnormalities are seen.  CT CERVICAL SPINE FINDINGS  There is no evidence of fracture or subluxation. Vertebral bodies demonstrate normal height and alignment. Intervertebral disc spaces are preserved. A few anterior and posterior disc osteophyte complexes are noted along the lower cervical spine. Prevertebral soft tissues are within normal limits. The visualized neural foramina are grossly unremarkable.  The thyroid gland is unremarkable in appearance. Minimal blebs are seen at the right lung apex. No significant soft tissue abnormalities are seen.  IMPRESSION: 1. No evidence of traumatic intracranial injury or fracture. 2. No evidence of fracture or subluxation along the cervical spine. 3. Minimal degenerative change at the lower cervical spine. 4. Mild cortical volume loss noted. 5. Minimal blebs at the lung apex.   Electronically Signed   By: Garald Balding M.D.   On: 06/18/2014 01:57   Dg Chest Portable 1 View  06/06/2014   CLINICAL DATA:  Chest pain and shortness of breath for 1 day.  EXAM: PORTABLE CHEST - 1 VIEW  COMPARISON:  Earlier this day at 2233 hour  FINDINGS: The cardiomediastinal contours are normal. Unchanged hyperinflation. Pulmonary vasculature is normal. No consolidation, pleural effusion, or pneumothorax. No acute osseous abnormalities are seen.  IMPRESSION: No change from prior exam.   Electronically Signed   By: Jeb Levering M.D.   On: 06/06/2014 23:26     Microbiology: Recent Results (from the past 240 hour(s))  MRSA PCR Screening     Status: None   Collection Time: 07/01/14  1:58 AM    Result Value Ref Range Status   MRSA by PCR NEGATIVE NEGATIVE Final    Comment:        The GeneXpert MRSA Assay (FDA approved for NASAL specimens only), is one component of a comprehensive MRSA colonization surveillance program. It is not intended to diagnose MRSA infection nor to guide  or monitor treatment for MRSA infections.      Labs: Basic Metabolic Panel:  Recent Labs Lab 06/30/14 1752 07/01/14 0242 07/02/14 0502  NA 138 144 140  K 3.6 4.1 3.8  CL 104 115* 111  CO2 26 26 26   GLUCOSE 141* 82 75  BUN <5* 6 <5*  CREATININE 0.70 0.58* 0.63  CALCIUM 8.7* 8.5* 8.0*   Liver Function Tests:  Recent Labs Lab 06/30/14 1752 07/02/14 0502  AST 14* 12*  ALT 10* 7*  ALKPHOS 47 39  BILITOT 0.5 0.3  PROT 6.6 5.0*  ALBUMIN 3.7 2.8*    Recent Labs Lab 06/30/14 1752 07/02/14 0502  LIPASE 716* 151*   No results for input(s): AMMONIA in the last 168 hours. CBC:  Recent Labs Lab 06/30/14 1752 07/01/14 0242  WBC 5.1 4.3  NEUTROABS 2.6  --   HGB 12.9* 11.5*  HCT 37.4* 35.0*  MCV 91.9 93.6  PLT 244 199   Cardiac Enzymes: No results for input(s): CKTOTAL, CKMB, CKMBINDEX, TROPONINI in the last 168 hours. BNP: Invalid input(s): POCBNP CBG: No results for input(s): GLUCAP in the last 168 hours.  Time coordinating discharge:  Greater than 30 minutes  Signed:  Kavin Weckwerth, DO [facility] Pager: [phone] 07/02/2014, 7:13 PM

## 2014-07-03 DIAGNOSIS — R569 Unspecified convulsions: Secondary | ICD-10-CM

## 2014-07-03 LAB — COMPREHENSIVE METABOLIC PANEL
ALT: 8 U/L — ABNORMAL LOW (ref 17–63)
ANION GAP: 8 (ref 5–15)
AST: 10 U/L — AB (ref 15–41)
Albumin: 2.7 g/dL — ABNORMAL LOW (ref 3.5–5.0)
Alkaline Phosphatase: 38 U/L (ref 38–126)
BILIRUBIN TOTAL: 0.6 mg/dL (ref 0.3–1.2)
CHLORIDE: 111 mmol/L (ref 101–111)
CO2: 26 mmol/L (ref 22–32)
CREATININE: 0.65 mg/dL (ref 0.61–1.24)
Calcium: 8.4 mg/dL — ABNORMAL LOW (ref 8.9–10.3)
GFR calc Af Amer: >60 mL/min (ref >60)
GFR calc non Af Amer: >60 mL/min (ref >60)
Glucose, Bld: 79 mg/dL (ref 65–99)
Potassium: 3.8 mmol/L (ref 3.5–5.1)
Sodium: 145 mmol/L (ref 135–145)
Total Protein: 4.9 g/dL — ABNORMAL LOW (ref 6.5–8.1)

## 2014-07-03 MED ORDER — PANTOPRAZOLE SODIUM 40 MG PO TBEC: 40.0000 mg | DELAYED_RELEASE_TABLET | Freq: Two times a day (BID) | ORAL | Status: DC

## 2014-07-03 NOTE — Progress Notes (Signed)
Pts HR would go into the low 40s while asleep. When awoken wouuld go back into upper 50s and lower 60s

## 2014-07-03 NOTE — Progress Notes (Signed)
Pt received discharge instruction and voiced understanding. All questions were answered. Pt stated that he is able to get home via Bus transportation. Bus ticket was provided by Education officer, museum. Pt was taken to bus stop by wheelchair.  Ferdinand Lango, RN

## 2014-07-03 NOTE — Progress Notes (Signed)
Did not administer 0600 0-4mg  Ativan dose. Pt slow in answering, Oriented to birthday, name of president, and name but continues to not know year or location

## 2014-07-03 NOTE — Progress Notes (Signed)
[facility] PROGRESS NOTE  Chase Blackwell GNF:[id number] DOB: December 25, 1965 DOA: 06/30/2014 PCP: [facility]  Assessment/Plan: #1 acute pancreatitis ETOH induced. Triglycerides were 29 on 06/11/2014. Patient with clinical improvement. Patient tolerating soft diet. Outpatient follow-up.  #2 alcohol dependence Stable.CIWA protocol.  #3 hypertension Lisinopril, metoprolol were discontinued as blood pressure remained soft. Outpatient follow-up.  #4 paranoid schizophrenia Continue home regimen of benztropine, Haldol, Seroquel.  #5 coronary artery disease Stable. Continue regimen of Lipitor, Plavix, aspirin.  #6 seizure disorder Stable. No seizures noted. Continue Keppra and Depakote.  #6 sinus bradycardia No AV block no sinus pauses noted. Stable.  #7 normocytic anemia Stable outpatient follow-up.  Code Status: Full Family Communication: Updated patient. No family present. Disposition Plan: Discharge home   Consultants:  None  Procedures:  None  Antibiotics:  None  HPI/Subjective: Patient denies abdominal pain. Patient states he tolerated soft diet for breakfast. Patient states he is ready to go home.  Objective: Filed Vitals:   07/03/14 0538  BP:   Pulse:   Temp: 97.4 F (36.3 C)  Resp:     Intake/Output Summary (Last 24 hours) at 07/03/14 1037 Last data filed at 07/03/14 1000  Gross per 24 hour  Intake   2640 ml  Output   6425 ml  Net  -3785 ml   Filed Weights   07/01/14 1550 07/02/14 0400 07/03/14 0538  Weight: 63.504 kg (140 lb) 65.454 kg (144 lb 4.8 oz) 66.679 kg (147 lb)    Exam:   General:  NAD  Cardiovascular: RRR  Respiratory: CTAB  Abdomen: Soft, nontender, nondistended, positive bowel sounds.  Musculoskeletal: No clubbing cyanosis or edema.  Data Reviewed: Basic Metabolic Panel:  Recent Labs Lab 06/30/14 1752 07/01/14 0242 07/02/14 0502 07/03/14 0315  NA 138 144 140 145  K 3.6 4.1 3.8 3.8  CL 104 115* 111 111   CO2 26 26 26 26   GLUCOSE 141* 82 75 79  BUN <5* 6 <5* <5*  CREATININE 0.70 0.58* 0.63 0.65  CALCIUM 8.7* 8.5* 8.0* 8.4*   Liver Function Tests:  Recent Labs Lab 06/30/14 1752 07/02/14 0502 07/03/14 0315  AST 14* 12* 10*  ALT 10* 7* 8*  ALKPHOS 47 39 38  BILITOT 0.5 0.3 0.6  PROT 6.6 5.0* 4.9*  ALBUMIN 3.7 2.8* 2.7*    Recent Labs Lab 06/30/14 1752 07/02/14 0502  LIPASE 716* 151*   No results for input(s): AMMONIA in the last 168 hours. CBC:  Recent Labs Lab 06/30/14 1752 07/01/14 0242  WBC 5.1 4.3  NEUTROABS 2.6  --   HGB 12.9* 11.5*  HCT 37.4* 35.0*  MCV 91.9 93.6  PLT 244 199   Cardiac Enzymes: No results for input(s): CKTOTAL, CKMB, CKMBINDEX, TROPONINI in the last 168 hours. BNP (last 3 results)  Recent Labs  05/01/14 2153 06/06/14 2206  BNP 21.0 28.5    ProBNP (last 3 results) No results for input(s): PROBNP in the last 8760 hours.  CBG: No results for input(s): GLUCAP in the last 168 hours.  Recent Results (from the past 240 hour(s))  MRSA PCR Screening     Status: None   Collection Time: 07/01/14  1:58 AM  Result Value Ref Range Status   MRSA by PCR NEGATIVE NEGATIVE Final    Comment:        The GeneXpert MRSA Assay (FDA approved for NASAL specimens only), is one component of a comprehensive MRSA colonization surveillance program. It is not intended to diagnose MRSA infection nor to guide  or monitor treatment for MRSA infections.      Studies: No results found.  Scheduled Meds: . aspirin  81 mg Oral Daily  . atorvastatin  80 mg Oral QHS  . benztropine  0.5 mg Oral BID  . clopidogrel  75 mg Oral Daily  . divalproex  500 mg Oral BID  . enoxaparin (LOVENOX) injection  40 mg Subcutaneous Q24H  . folic acid  1 mg Oral Daily  . [START ON 07/24/2014] haloperidol decanoate  100 mg Intramuscular Q28 days  . levETIRAcetam  1,250 mg Oral BID  . LORazepam  0-4 mg Intravenous Q12H  . multivitamin with minerals  1 tablet Oral Daily   . pantoprazole  40 mg Oral BID  . QUEtiapine  50 mg Oral QHS  . thiamine  100 mg Oral Daily  . vitamin B-12  500 mcg Oral Daily   Continuous Infusions:   Principal Problem:   Acute pancreatitis Active Problems:   PARANOID SCHIZOPHRENIA, CHRONIC   Alcohol abuse   Essential hypertension   CAD (coronary artery disease)   Abdominal pain, epigastric   Anemia    Time spent: 30 mins    [facility] MD [facility] Pager [phone]. If 7PM-7AM, please contact night-coverage at [email or web address], password [email or web address] 07/03/2014, 10:37 AM  LOS: 3 days

## 2014-07-07 ENCOUNTER — Emergency Department (HOSPITAL_COMMUNITY)
Admission: EM | Admit: 2014-07-07 | Discharge: 2014-07-07 | Disposition: A | Discharge status: Home / Self Care | Payer: Medicare Other | Attending: Emergency Medicine | Admitting: Emergency Medicine

## 2014-07-07 ENCOUNTER — Encounter (HOSPITAL_COMMUNITY): Payer: Self-pay | Admitting: Emergency Medicine

## 2014-07-07 DIAGNOSIS — K859 Acute pancreatitis, unspecified: Secondary | ICD-10-CM | POA: Diagnosis not present

## 2014-07-07 DIAGNOSIS — R1084 Generalized abdominal pain: Secondary | ICD-10-CM | POA: Diagnosis present

## 2014-07-07 DIAGNOSIS — Z720 Tobacco use: Secondary | ICD-10-CM | POA: Insufficient documentation

## 2014-07-07 DIAGNOSIS — J45909 Unspecified asthma, uncomplicated: Secondary | ICD-10-CM | POA: Insufficient documentation

## 2014-07-07 DIAGNOSIS — Z79899 Other long term (current) drug therapy: Secondary | ICD-10-CM | POA: Insufficient documentation

## 2014-07-07 DIAGNOSIS — I2510 Atherosclerotic heart disease of native coronary artery without angina pectoris: Secondary | ICD-10-CM | POA: Diagnosis not present

## 2014-07-07 DIAGNOSIS — F209 Schizophrenia, unspecified: Secondary | ICD-10-CM | POA: Diagnosis not present

## 2014-07-07 DIAGNOSIS — I10 Essential (primary) hypertension: Secondary | ICD-10-CM | POA: Insufficient documentation

## 2014-07-07 DIAGNOSIS — G40909 Epilepsy, unspecified, not intractable, without status epilepticus: Secondary | ICD-10-CM | POA: Diagnosis not present

## 2014-07-07 DIAGNOSIS — F329 Major depressive disorder, single episode, unspecified: Secondary | ICD-10-CM | POA: Diagnosis not present

## 2014-07-07 DIAGNOSIS — G8929 Other chronic pain: Secondary | ICD-10-CM | POA: Diagnosis not present

## 2014-07-07 DIAGNOSIS — Z7982 Long term (current) use of aspirin: Secondary | ICD-10-CM | POA: Insufficient documentation

## 2014-07-07 DIAGNOSIS — Z9889 Other specified postprocedural states: Secondary | ICD-10-CM | POA: Insufficient documentation

## 2014-07-07 LAB — URINALYSIS, ROUTINE W REFLEX MICROSCOPIC
BILIRUBIN URINE: NEGATIVE
Glucose, UA: NEGATIVE mg/dL
Hgb urine dipstick: NEGATIVE
Ketones, ur: NEGATIVE mg/dL
Leukocytes, UA: NEGATIVE
Nitrite: NEGATIVE
PH: 7.5 (ref 5.0–8.0)
Protein, ur: NEGATIVE mg/dL
Specific Gravity, Urine: 1.004 — ABNORMAL LOW (ref 1.005–1.030)
Urobilinogen, UA: 1 mg/dL (ref 0.0–1.0)

## 2014-07-07 LAB — COMPREHENSIVE METABOLIC PANEL
ALT: 26 U/L (ref 17–63)
AST: 18 U/L (ref 15–41)
Albumin: 3 g/dL — ABNORMAL LOW (ref 3.5–5.0)
Alkaline Phosphatase: 58 U/L (ref 38–126)
Anion gap: 8 (ref 5–15)
CHLORIDE: 101 mmol/L (ref 101–111)
CO2: 29 mmol/L (ref 22–32)
Calcium: 8.3 mg/dL — ABNORMAL LOW (ref 8.9–10.3)
Creatinine, Ser: 0.83 mg/dL (ref 0.61–1.24)
GFR calc Af Amer: >60 mL/min (ref >60)
GLUCOSE: 95 mg/dL (ref 65–99)
Potassium: 4 mmol/L (ref 3.5–5.1)
SODIUM: 138 mmol/L (ref 135–145)
TOTAL PROTEIN: 5.3 g/dL — AB (ref 6.5–8.1)
Total Bilirubin: 0.6 mg/dL (ref 0.3–1.2)

## 2014-07-07 LAB — CBC
HEMATOCRIT: 31.9 % — AB (ref 39.0–52.0)
Hemoglobin: 10.9 g/dL — ABNORMAL LOW (ref 13.0–17.0)
MCH: 32 pg (ref 26.0–34.0)
MCHC: 34.2 g/dL (ref 30.0–36.0)
MCV: 93.5 fL (ref 78.0–100.0)
Platelets: 171 10*3/uL (ref 150–400)
RBC: 3.41 MIL/uL — AB (ref 4.22–5.81)
RDW: 14.6 % (ref 11.5–15.5)
WBC: 7.3 10*3/uL (ref 4.0–10.5)

## 2014-07-07 LAB — LIPASE, BLOOD: Lipase: 331 U/L — ABNORMAL HIGH (ref 22–51)

## 2014-07-07 MED ORDER — TRAMADOL HCL 50 MG PO TABS: 50.0000 mg | ORAL_TABLET | Freq: Four times a day (QID) | ORAL | Status: DC | PRN

## 2014-07-07 MED ORDER — HYDROMORPHONE HCL 1 MG/ML IJ SOLN
1.0000 mg | Freq: Once | INTRAMUSCULAR | Status: AC
  Administered 2014-07-07: 1 mg via INTRAVENOUS
  Filled 2014-07-07: qty 1

## 2014-07-07 MED ORDER — SODIUM CHLORIDE 0.9 % IV BOLUS (SEPSIS)
1000.0000 mL | Freq: Once | INTRAVENOUS | Status: AC
  Administered 2014-07-07: 1000 mL via INTRAVENOUS

## 2014-07-07 MED ORDER — HYDROCODONE-ACETAMINOPHEN 5-325 MG PO TABS
2.0000 | ORAL_TABLET | Freq: Once | ORAL | Status: AC
  Administered 2014-07-07: 2 via ORAL
  Filled 2014-07-07: qty 2

## 2014-07-07 NOTE — ED Notes (Signed)
C/o abd. Pain onset 2 weeks ago denies any nausea and vomiting . States he normally gets Vicodin for the pain pain

## 2014-07-07 NOTE — Discharge Instructions (Signed)

## 2014-07-07 NOTE — ED Provider Notes (Signed)
CSN: [id number]     Arrival date & time 07/07/14  1350 History   First MD Initiated Contact with Patient 07/07/14 1407     Chief Complaint  Patient presents with  . Abdominal Pain     (Consider location/radiation/quality/duration/timing/severity/associated sxs/prior Treatment) HPI Comments: Poor historian, hx of schizophrenia.  Recently admitted for intractable pain for pancreatitis, just discharged. No improvement in pain.  Patient is a 49 y.o. male presenting with abdominal pain. The history is provided by the patient.  Abdominal Pain Pain location:  Generalized Pain quality: aching   Pain radiates to:  Does not radiate Pain severity:  Mild Onset quality:  Gradual Timing:  Constant Progression:  Unchanged Chronicity:  New Context: not sick contacts   Relieved by:  Nothing Worsened by:  Nothing tried Associated symptoms: no cough, no fever, no shortness of breath and no vomiting     Past Medical History  Diagnosis Date  . Hypertension   . Asthma   . GERD (gastroesophageal reflux disease)   . Chronic pain   . Schizophrenia   . Alcohol abuse   . Seizures   . Pancreatitis   . Depression   . CAD (coronary artery disease)    Past Surgical History  Procedure Laterality Date  . Left heart catheterization with coronary angiogram N/A 07/06/2013    Procedure: LEFT HEART CATHETERIZATION WITH CORONARY ANGIOGRAM;  Surgeon: Clent Demark, MD;  Location: [facility] CATH LAB;  Service: Cardiovascular;  Laterality: N/A;  . Percutaneous coronary stent intervention (pci-s)  07/06/2013    Procedure: PERCUTANEOUS CORONARY STENT INTERVENTION (PCI-S);  Surgeon: Clent Demark, MD;  Location: [facility] CATH LAB;  Service: Cardiovascular;;   Family History  Problem Relation Age of Onset  . Malignant hyperthermia Mother   . Cirrhosis Father   . Alcohol abuse Father    History  Substance Use Topics  . Smoking status: Current Every Day Smoker -- 0.00 packs/day    Types: Cigarettes  . Smokeless  tobacco: Not on file  . Alcohol Use: Yes     Comment: daily heavy drinker    Review of Systems  Constitutional: Negative for fever.  Respiratory: Negative for cough and shortness of breath.   Gastrointestinal: Negative for vomiting and abdominal pain.  All other systems reviewed and are negative.     Allergies  Hctz; Hydroxyzine; Sulfonamide derivatives; and Cetirizine & related  Home Medications   Prior to Admission medications   Medication Sig Start Date End Date Taking? Authorizing Provider  aspirin 81 MG chewable tablet Chew 1 tablet (81 mg total) by mouth daily. Patient not taking: Reported on 07/01/2014 11/23/13   Kristen N Brunton, DO  atorvastatin (LIPITOR) 80 MG tablet Take 1 tablet (80 mg total) by mouth daily. Patient taking differently: Take 80 mg by mouth at bedtime.  02/13/14   Nicole Pisciotta, PA-C  benztropine (COGENTIN) 0.5 MG tablet Take 0.5 mg by mouth 2 (two) times daily.    Historical Provider, MD  clopidogrel (PLAVIX) 75 MG tablet Take 1 tablet (75 mg total) by mouth daily. 02/13/14   Nicole Pisciotta, PA-C  divalproex (DEPAKOTE) 500 MG DR tablet Take 1 tablet (500 mg total) by mouth 2 (two) times daily. 02/13/14   Nicole Pisciotta, PA-C  famotidine (PEPCID) 20 MG tablet Take 1 tablet (20 mg total) by mouth 2 (two) times daily. 02/13/14   Nicole Pisciotta, PA-C  feeding supplement, ENSURE ENLIVE, (ENSURE ENLIVE) LIQD Take 237 mLs by mouth 2 (two) times daily between meals. Patient not taking:  Reported on 07/01/2014 06/11/14   Eugenie Filler, MD  folic acid (FOLVITE) 1 MG tablet Take 1 tablet (1 mg total) by mouth daily. 06/11/14   Eugenie Filler, MD  haloperidol decanoate (HALDOL DECANOATE) 100 MG/ML injection Inject 1 mL into the muscle every 28 (twenty-eight) days. 05/24/14   Historical Provider, MD  hydrALAZINE (APRESOLINE) 10 MG tablet Take 1 tablet (10 mg total) by mouth 2 (two) times daily. 02/13/14   Nicole Pisciotta, PA-C  levETIRAcetam (KEPPRA) 250 MG tablet  Take 5 tablets (1,250 mg total) by mouth 2 (two) times daily. 06/18/14   Everlene Balls, MD  LORazepam (ATIVAN) 0.5 MG tablet Take 0.5 mg by mouth at bedtime as needed for anxiety.    Historical Provider, MD  metoprolol tartrate (LOPRESSOR) 25 MG tablet Take 0.5 tablets (12.5 mg total) by mouth 2 (two) times daily. Patient taking differently: Take 25 mg by mouth daily.  02/13/14   Nicole Pisciotta, PA-C  Multiple Vitamin (MULTIVITAMIN WITH MINERALS) TABS tablet Take 1 tablet by mouth daily. 06/11/14   Eugenie Filler, MD  QUEtiapine (SEROQUEL) 50 MG tablet Take 1 tablet (50 mg total) by mouth at bedtime. 02/13/14   Nicole Pisciotta, PA-C  thiamine 100 MG tablet Take 1 tablet (100 mg total) by mouth daily. 06/11/14   Eugenie Filler, MD  traZODone (DESYREL) 100 MG tablet Take 1 tablet by mouth at bedtime as needed for sleep.  05/13/14   Historical Provider, MD  vitamin B-12 500 MCG tablet Take 1 tablet (500 mcg total) by mouth daily. 07/02/14   Orson Eva, MD   BP 100/60 mmHg  Pulse 62  Temp(Src) 97.8 F (36.6 C) (Oral)  Resp 18  SpO2 100% Physical Exam  Constitutional: He is oriented to person, place, and time. He appears well-developed and well-nourished. No distress.  HENT:  Head: Normocephalic and atraumatic.  Mouth/Throat: No oropharyngeal exudate.  Eyes: EOM are normal. Pupils are equal, round, and reactive to light.  Neck: Normal range of motion. Neck supple.  Cardiovascular: Normal rate and regular rhythm.  Exam reveals no friction rub.   No murmur heard. Pulmonary/Chest: Effort normal and breath sounds normal. No respiratory distress. He has no wheezes. He has no rales.  Abdominal: Soft. He exhibits no distension. There is tenderness (mild, mid-lower abdomen). There is no rebound.  Musculoskeletal: Normal range of motion. He exhibits no edema.  Neurological: He is alert and oriented to person, place, and time.  Skin: No rash noted. He is not diaphoretic.  Nursing note and vitals  reviewed.   ED Course  Procedures (including critical care time) Labs Review Labs Reviewed  CBC - Abnormal; Notable for the following:    RBC 3.41 (*)    Hemoglobin 10.9 (*)    HCT 31.9 (*)    All other components within normal limits  COMPREHENSIVE METABOLIC PANEL - Abnormal; Notable for the following:    BUN <5 (*)    Calcium 8.3 (*)    Total Protein 5.3 (*)    Albumin 3.0 (*)    All other components within normal limits  LIPASE, BLOOD - Abnormal; Notable for the following:    Lipase 331 (*)    All other components within normal limits  URINALYSIS, ROUTINE W REFLEX MICROSCOPIC (NOT AT [facility]) - Abnormal; Notable for the following:    Specific Gravity, Urine 1.004 (*)    All other components within normal limits    Imaging Review No results found.   EKG Interpretation None  MDM   Final diagnoses:  Acute pancreatitis, unspecified pancreatitis type    49 year old male here with lower abdominal pain. History of pancreatitis and alcohol abuse and schizophrenia. He was just admitted for this pain and discharged 6 days ago. Patient well-appearing, no fevers. He denies vomiting or diarrhea. He is disheveled and smells bad. He is a very poor historian. We'll check labs, give pain meds and fluids. He has some lower abdominal tenderness that is mild, no focal rebound or guarding. I do not feel he warrants a CT scan.  Labs show lipase of 331. He is well appearing here and pain meds help. He sleeping comfortably after pain meds. Clinically he is well-appearing stating he has been eating and has not been vomiting.  He has history of substance abuse, will hold off on pain meds to go home. Will hold off on Tramadol also as he has hx of seizures.  Stable for discharge.  Evelina Bucy, MD 07/07/14 [phone]

## 2014-07-07 NOTE — ED Notes (Signed)
Pt. Stated, I've had stomach pain for a couple of days , just stomach pain.  Pt. Denied any other symptoms.

## 2014-07-08 ENCOUNTER — Encounter (HOSPITAL_COMMUNITY): Payer: Self-pay | Admitting: Emergency Medicine

## 2014-07-08 ENCOUNTER — Emergency Department (HOSPITAL_COMMUNITY)
Admission: EM | Admit: 2014-07-08 | Discharge: 2014-07-08 | Disposition: A | Discharge status: Home / Self Care | Payer: Medicare Other | Attending: Emergency Medicine | Admitting: Emergency Medicine

## 2014-07-08 DIAGNOSIS — Z7902 Long term (current) use of antithrombotics/antiplatelets: Secondary | ICD-10-CM | POA: Insufficient documentation

## 2014-07-08 DIAGNOSIS — I10 Essential (primary) hypertension: Secondary | ICD-10-CM | POA: Insufficient documentation

## 2014-07-08 DIAGNOSIS — Z9861 Coronary angioplasty status: Secondary | ICD-10-CM | POA: Diagnosis not present

## 2014-07-08 DIAGNOSIS — F209 Schizophrenia, unspecified: Secondary | ICD-10-CM | POA: Diagnosis not present

## 2014-07-08 DIAGNOSIS — F329 Major depressive disorder, single episode, unspecified: Secondary | ICD-10-CM | POA: Insufficient documentation

## 2014-07-08 DIAGNOSIS — K861 Other chronic pancreatitis: Secondary | ICD-10-CM | POA: Insufficient documentation

## 2014-07-08 DIAGNOSIS — Z7982 Long term (current) use of aspirin: Secondary | ICD-10-CM | POA: Insufficient documentation

## 2014-07-08 DIAGNOSIS — J45909 Unspecified asthma, uncomplicated: Secondary | ICD-10-CM | POA: Insufficient documentation

## 2014-07-08 DIAGNOSIS — R109 Unspecified abdominal pain: Secondary | ICD-10-CM | POA: Diagnosis present

## 2014-07-08 DIAGNOSIS — Z9889 Other specified postprocedural states: Secondary | ICD-10-CM | POA: Diagnosis not present

## 2014-07-08 DIAGNOSIS — K219 Gastro-esophageal reflux disease without esophagitis: Secondary | ICD-10-CM | POA: Diagnosis not present

## 2014-07-08 DIAGNOSIS — I2510 Atherosclerotic heart disease of native coronary artery without angina pectoris: Secondary | ICD-10-CM | POA: Diagnosis not present

## 2014-07-08 DIAGNOSIS — Z720 Tobacco use: Secondary | ICD-10-CM | POA: Diagnosis not present

## 2014-07-08 DIAGNOSIS — Z79899 Other long term (current) drug therapy: Secondary | ICD-10-CM | POA: Diagnosis not present

## 2014-07-08 DIAGNOSIS — G40909 Epilepsy, unspecified, not intractable, without status epilepticus: Secondary | ICD-10-CM | POA: Diagnosis not present

## 2014-07-08 LAB — RAPID URINE DRUG SCREEN, HOSP PERFORMED
AMPHETAMINES: NOT DETECTED
Barbiturates: NOT DETECTED
Benzodiazepines: NOT DETECTED
Cocaine: NOT DETECTED
OPIATES: NOT DETECTED
Tetrahydrocannabinol: POSITIVE — AB

## 2014-07-08 MED ORDER — ACETAMINOPHEN 325 MG PO TABS
650.0000 mg | ORAL_TABLET | Freq: Once | ORAL | Status: AC
  Administered 2014-07-08: 650 mg via ORAL
  Filled 2014-07-08: qty 2

## 2014-07-08 NOTE — ED Notes (Signed)
Pt is in stable condition upon d/c and ambulates from ED with staff.

## 2014-07-08 NOTE — ED Notes (Signed)
Pt states he walked here from the [facility]. Pt has been to ED multiple times with c/o abd pain and was seen yesterday by Dr. Mingo Amber.

## 2014-07-08 NOTE — ED Provider Notes (Addendum)
CSN: [id number]     Arrival date & time 07/08/14  1401 History   First MD Initiated Contact with Patient 07/08/14 1502     Chief Complaint  Patient presents with  . Abdominal Pain     (Consider location/radiation/quality/duration/timing/severity/associated sxs/prior Treatment) HPI   Chase Blackwell is a 49 y.o. male who presents for evaluation of  abdominal pain, which is ongoing, for several days. He stated that he has not been evaluated or treated for it. Using the ED, yesterday, and discharged from the hospital 4 days ago for the same problem. He has had a waxing and waning lipase elevation during this period of time. He has a history of chronic pancreatitis. He walked here 12 miles from the [place], be evaluated. States that he is hungry now. Denies fever, cough, back pain or weakness. States he is taking all of his medications, as directed. There are no other known modifying factors.   Past Medical History  Diagnosis Date  . Hypertension   . Asthma   . GERD (gastroesophageal reflux disease)   . Chronic pain   . Schizophrenia   . Alcohol abuse   . Seizures   . Pancreatitis   . Depression   . CAD (coronary artery disease)    Past Surgical History  Procedure Laterality Date  . Left heart catheterization with coronary angiogram N/A 07/06/2013    Procedure: LEFT HEART CATHETERIZATION WITH CORONARY ANGIOGRAM;  Surgeon: Clent Demark, MD;  Location: [facility] CATH LAB;  Service: Cardiovascular;  Laterality: N/A;  . Percutaneous coronary stent intervention (pci-s)  07/06/2013    Procedure: PERCUTANEOUS CORONARY STENT INTERVENTION (PCI-S);  Surgeon: Clent Demark, MD;  Location: [facility] CATH LAB;  Service: Cardiovascular;;   Family History  Problem Relation Age of Onset  . Malignant hyperthermia Mother   . Cirrhosis Father   . Alcohol abuse Father    History  Substance Use Topics  . Smoking status: Current Every Day Smoker -- 0.00 packs/day    Types: Cigarettes  . Smokeless tobacco: Not  on file  . Alcohol Use: Yes     Comment: daily heavy drinker    Review of Systems  All other systems reviewed and are negative.     Allergies  Hctz; Hydroxyzine; Sulfonamide derivatives; and Cetirizine & related  Home Medications   Prior to Admission medications   Medication Sig Start Date End Date Taking? Authorizing Provider  atorvastatin (LIPITOR) 80 MG tablet Take 1 tablet (80 mg total) by mouth daily. Patient taking differently: Take 80 mg by mouth at bedtime.  02/13/14  Yes Nicole Pisciotta, PA-C  clopidogrel (PLAVIX) 75 MG tablet Take 1 tablet (75 mg total) by mouth daily. 02/13/14  Yes Nicole Pisciotta, PA-C  aspirin 81 MG chewable tablet Chew 1 tablet (81 mg total) by mouth daily. Patient not taking: Reported on 07/01/2014 11/23/13   Kristen N Harbor, DO  benztropine (COGENTIN) 0.5 MG tablet Take 0.5 mg by mouth 2 (two) times daily.    Historical Provider, MD  divalproex (DEPAKOTE) 500 MG DR tablet Take 1 tablet (500 mg total) by mouth 2 (two) times daily. 02/13/14   Nicole Pisciotta, PA-C  famotidine (PEPCID) 20 MG tablet Take 1 tablet (20 mg total) by mouth 2 (two) times daily. 02/13/14   Nicole Pisciotta, PA-C  feeding supplement, ENSURE ENLIVE, (ENSURE ENLIVE) LIQD Take 237 mLs by mouth 2 (two) times daily between meals. Patient not taking: Reported on 07/01/2014 06/11/14   Eugenie Filler, MD  folic acid Darnelle Catalan)  1 MG tablet Take 1 tablet (1 mg total) by mouth daily. 06/11/14   Eugenie Filler, MD  haloperidol decanoate (HALDOL DECANOATE) 100 MG/ML injection Inject 1 mL into the muscle every 28 (twenty-eight) days. 05/24/14   Historical Provider, MD  hydrALAZINE (APRESOLINE) 10 MG tablet Take 1 tablet (10 mg total) by mouth 2 (two) times daily. 02/13/14   Nicole Pisciotta, PA-C  levETIRAcetam (KEPPRA) 250 MG tablet Take 5 tablets (1,250 mg total) by mouth 2 (two) times daily. 06/18/14   Everlene Balls, MD  LORazepam (ATIVAN) 0.5 MG tablet Take 0.5 mg by mouth at bedtime as needed for  anxiety.    Historical Provider, MD  metoprolol tartrate (LOPRESSOR) 25 MG tablet Take 0.5 tablets (12.5 mg total) by mouth 2 (two) times daily. Patient taking differently: Take 25 mg by mouth daily.  02/13/14   Nicole Pisciotta, PA-C  Multiple Vitamin (MULTIVITAMIN WITH MINERALS) TABS tablet Take 1 tablet by mouth daily. 06/11/14   Eugenie Filler, MD  QUEtiapine (SEROQUEL) 50 MG tablet Take 1 tablet (50 mg total) by mouth at bedtime. 02/13/14   Nicole Pisciotta, PA-C  thiamine 100 MG tablet Take 1 tablet (100 mg total) by mouth daily. 06/11/14   Eugenie Filler, MD  traZODone (DESYREL) 100 MG tablet Take 1 tablet by mouth at bedtime as needed for sleep.  05/13/14   Historical Provider, MD  vitamin B-12 500 MCG tablet Take 1 tablet (500 mcg total) by mouth daily. 07/02/14   Orson Eva, MD   BP 92/73 mmHg  Pulse 52  Temp(Src) 98.1 F (36.7 C) (Oral)  Resp 16  Ht 6' (1.829 m)  Wt 150 lb (68.04 kg)  BMI 20.34 kg/m2  SpO2 100% Physical Exam  Constitutional: He is oriented to person, place, and time. He appears well-developed. No distress (He is nontoxic).  He appears older than stated age, and under nourished.  HENT:  Head: Normocephalic and atraumatic.  Right Ear: External ear normal.  Left Ear: External ear normal.  Eyes: Conjunctivae and EOM are normal. Pupils are equal, round, and reactive to light.  Neck: Normal range of motion and phonation normal. Neck supple.  Cardiovascular: Normal rate, regular rhythm and normal heart sounds.   Pulmonary/Chest: Effort normal and breath sounds normal. He exhibits no bony tenderness.  Abdominal: Soft. He exhibits no distension. There is no tenderness. There is no guarding.  There is no abdominal tenderness to palpation and no palpable mass.  Musculoskeletal: Normal range of motion.  Neurological: He is alert and oriented to person, place, and time. No cranial nerve deficit or sensory deficit. He exhibits normal muscle tone. Coordination normal.  Skin:  Skin is warm, dry and intact.  Psychiatric: He has a normal mood and affect. His behavior is normal. Judgment and thought content normal.  Nursing note and vitals reviewed.   ED Course  Procedures (including critical care time)  Medications  acetaminophen (TYLENOL) tablet 650 mg (650 mg Oral Given 07/08/14 1628)    Patient Vitals for the past 24 hrs:  BP Temp Temp src Pulse Resp SpO2 Height Weight  07/08/14 1545 92/73 mmHg - - (!) 52 - 100 % - -  07/08/14 1425 115/65 mmHg 98.1 F (36.7 C) Oral (!) 56 16 98 % 6' (1.829 m) 150 lb (68.04 kg)    4:28 PM Reevaluation with update and discussion. After initial assessment and treatment, an updated evaluation reveals he is tolerating food. His pain has been treated with Tylenol. Findings were discussed with  the patient, all questions were answered.Daleen Bo L    Labs Review Labs Reviewed  URINE RAPID DRUG SCREEN, HOSP PERFORMED    Imaging Review No results found.   EKG Interpretation None      MDM   Final diagnoses:  Chronic pancreatitis, unspecified pancreatitis type    Nonspecific pain with history of recurrent pancreatitis problems. He appears to have chronic pancreatitis. He frequently comes to the ED, for painful conditions as well as conditions associated with his schizophrenia. He is nontoxic in appearance, and was able to walk long distance to come to the ED. He does not have clinical evidence for pancreatitis such as abdominal pain, nausea or vomiting. There is no evidence for dehydration. He has been recently, principally evaluated. His intermittent lipase elevation is not well correlated with his symptoms. He does not appear to need admission at this time.  Nursing Notes Reviewed/ Care Coordinated, and agree without changes. Applicable Imaging Reviewed.  Interpretation of Laboratory Data incorporated into ED treatment  Daleen Bo, MD 07/08/14 1628  Daleen Bo, MD 07/08/14 [phone]

## 2014-07-08 NOTE — ED Notes (Signed)
Pt. Stated, i was here yesterday and my stomach still hurts. They didn;'t give me any medicine.

## 2014-07-08 NOTE — ED Notes (Addendum)
Pt denies N/V/D

## 2014-07-14 ENCOUNTER — Emergency Department (HOSPITAL_COMMUNITY)
Admission: EM | Admit: 2014-07-14 | Discharge: 2014-07-14 | Discharge status: Against Medical Advice | Payer: Medicare Other | Attending: Emergency Medicine | Admitting: Emergency Medicine

## 2014-07-14 ENCOUNTER — Encounter (HOSPITAL_COMMUNITY): Payer: Self-pay | Admitting: *Deleted

## 2014-07-14 DIAGNOSIS — Z720 Tobacco use: Secondary | ICD-10-CM | POA: Diagnosis not present

## 2014-07-14 DIAGNOSIS — F1099 Alcohol use, unspecified with unspecified alcohol-induced disorder: Secondary | ICD-10-CM | POA: Insufficient documentation

## 2014-07-14 DIAGNOSIS — I10 Essential (primary) hypertension: Secondary | ICD-10-CM | POA: Diagnosis not present

## 2014-07-14 DIAGNOSIS — G8929 Other chronic pain: Secondary | ICD-10-CM | POA: Diagnosis not present

## 2014-07-14 DIAGNOSIS — R1084 Generalized abdominal pain: Secondary | ICD-10-CM | POA: Insufficient documentation

## 2014-07-14 DIAGNOSIS — J45909 Unspecified asthma, uncomplicated: Secondary | ICD-10-CM | POA: Diagnosis not present

## 2014-07-14 DIAGNOSIS — I2510 Atherosclerotic heart disease of native coronary artery without angina pectoris: Secondary | ICD-10-CM | POA: Insufficient documentation

## 2014-07-14 NOTE — ED Notes (Signed)
Pt reports having generalized abd pain since Friday. Denies n/v/d. Pt reports etoh use, but last drank Friday. No acute distress noted.

## 2014-07-14 NOTE — ED Notes (Signed)
Patient stated that he was leaving and that he now felt fine

## 2014-07-15 ENCOUNTER — Emergency Department (HOSPITAL_COMMUNITY)
Admission: EM | Admit: 2014-07-15 | Discharge: 2014-07-15 | Disposition: A | Discharge status: Home / Self Care | Payer: Medicare Other | Source: Home / Self Care | Attending: Emergency Medicine | Admitting: Emergency Medicine

## 2014-07-15 ENCOUNTER — Encounter (HOSPITAL_COMMUNITY): Payer: Self-pay | Admitting: Nurse Practitioner

## 2014-07-15 DIAGNOSIS — Z7902 Long term (current) use of antithrombotics/antiplatelets: Secondary | ICD-10-CM

## 2014-07-15 DIAGNOSIS — R1013 Epigastric pain: Secondary | ICD-10-CM

## 2014-07-15 DIAGNOSIS — R748 Abnormal levels of other serum enzymes: Secondary | ICD-10-CM | POA: Insufficient documentation

## 2014-07-15 DIAGNOSIS — F329 Major depressive disorder, single episode, unspecified: Secondary | ICD-10-CM

## 2014-07-15 DIAGNOSIS — E780 Pure hypercholesterolemia: Secondary | ICD-10-CM | POA: Diagnosis present

## 2014-07-15 DIAGNOSIS — I2510 Atherosclerotic heart disease of native coronary artery without angina pectoris: Secondary | ICD-10-CM

## 2014-07-15 DIAGNOSIS — G931 Anoxic brain damage, not elsewhere classified: Secondary | ICD-10-CM | POA: Diagnosis present

## 2014-07-15 DIAGNOSIS — F209 Schizophrenia, unspecified: Secondary | ICD-10-CM | POA: Insufficient documentation

## 2014-07-15 DIAGNOSIS — G8929 Other chronic pain: Secondary | ICD-10-CM

## 2014-07-15 DIAGNOSIS — E785 Hyperlipidemia, unspecified: Secondary | ICD-10-CM | POA: Diagnosis present

## 2014-07-15 DIAGNOSIS — K8051 Calculus of bile duct without cholangitis or cholecystitis with obstruction: Principal | ICD-10-CM | POA: Diagnosis present

## 2014-07-15 DIAGNOSIS — Z7982 Long term (current) use of aspirin: Secondary | ICD-10-CM | POA: Insufficient documentation

## 2014-07-15 DIAGNOSIS — Z720 Tobacco use: Secondary | ICD-10-CM

## 2014-07-15 DIAGNOSIS — Z79899 Other long term (current) drug therapy: Secondary | ICD-10-CM | POA: Insufficient documentation

## 2014-07-15 DIAGNOSIS — J45909 Unspecified asthma, uncomplicated: Secondary | ICD-10-CM | POA: Insufficient documentation

## 2014-07-15 DIAGNOSIS — B852 Pediculosis, unspecified: Secondary | ICD-10-CM | POA: Diagnosis present

## 2014-07-15 DIAGNOSIS — K219 Gastro-esophageal reflux disease without esophagitis: Secondary | ICD-10-CM | POA: Diagnosis present

## 2014-07-15 DIAGNOSIS — I10 Essential (primary) hypertension: Secondary | ICD-10-CM | POA: Diagnosis present

## 2014-07-15 DIAGNOSIS — Z882 Allergy status to sulfonamides status: Secondary | ICD-10-CM

## 2014-07-15 DIAGNOSIS — G40909 Epilepsy, unspecified, not intractable, without status epilepticus: Secondary | ICD-10-CM

## 2014-07-15 DIAGNOSIS — Z955 Presence of coronary angioplasty implant and graft: Secondary | ICD-10-CM

## 2014-07-15 DIAGNOSIS — Z888 Allergy status to other drugs, medicaments and biological substances status: Secondary | ICD-10-CM

## 2014-07-15 DIAGNOSIS — E860 Dehydration: Secondary | ICD-10-CM | POA: Diagnosis present

## 2014-07-15 DIAGNOSIS — F1010 Alcohol abuse, uncomplicated: Secondary | ICD-10-CM | POA: Diagnosis present

## 2014-07-15 DIAGNOSIS — F17210 Nicotine dependence, cigarettes, uncomplicated: Secondary | ICD-10-CM | POA: Diagnosis present

## 2014-07-15 LAB — ETHANOL

## 2014-07-15 LAB — COMPREHENSIVE METABOLIC PANEL
ALT: 181 U/L — ABNORMAL HIGH (ref 17–63)
AST: 98 U/L — ABNORMAL HIGH (ref 15–41)
Albumin: 3.9 g/dL (ref 3.5–5.0)
Alkaline Phosphatase: 249 U/L — ABNORMAL HIGH (ref 38–126)
Anion gap: 9 (ref 5–15)
BILIRUBIN TOTAL: 0.4 mg/dL (ref 0.3–1.2)
BUN: 9 mg/dL (ref 6–20)
CALCIUM: 9.4 mg/dL (ref 8.9–10.3)
CO2: 31 mmol/L (ref 22–32)
Chloride: 99 mmol/L — ABNORMAL LOW (ref 101–111)
Creatinine, Ser: 0.93 mg/dL (ref 0.61–1.24)
GFR calc Af Amer: >60 mL/min (ref >60)
GLUCOSE: 103 mg/dL — AB (ref 65–99)
Potassium: 4.2 mmol/L (ref 3.5–5.1)
SODIUM: 139 mmol/L (ref 135–145)
Total Protein: 7.3 g/dL (ref 6.5–8.1)

## 2014-07-15 LAB — CBC
HCT: 40.2 % (ref 39.0–52.0)
Hemoglobin: 13.5 g/dL (ref 13.0–17.0)
MCH: 31.5 pg (ref 26.0–34.0)
MCHC: 33.6 g/dL (ref 30.0–36.0)
MCV: 93.9 fL (ref 78.0–100.0)
PLATELETS: 309 10*3/uL (ref 150–400)
RBC: 4.28 MIL/uL (ref 4.22–5.81)
RDW: 15.1 % (ref 11.5–15.5)
WBC: 6.9 10*3/uL (ref 4.0–10.5)

## 2014-07-15 LAB — LIPASE, BLOOD: Lipase: 213 U/L — ABNORMAL HIGH (ref 22–51)

## 2014-07-15 MED ORDER — KETOROLAC TROMETHAMINE 30 MG/ML IJ SOLN
30.0000 mg | Freq: Once | INTRAMUSCULAR | Status: AC
  Administered 2014-07-15: 30 mg via INTRAVENOUS
  Filled 2014-07-15: qty 1

## 2014-07-15 MED ORDER — SODIUM CHLORIDE 0.9 % IV BOLUS (SEPSIS)
1000.0000 mL | Freq: Once | INTRAVENOUS | Status: AC
  Administered 2014-07-15: 1000 mL via INTRAVENOUS

## 2014-07-15 NOTE — Discharge Instructions (Signed)
As discussed, your abdominal pain is likely due to your history of alcohol intake.  It is very important that he stay well-hydrated, monitor your condition carefully, and follow-up with your primary care physician in the next 2 or 3 days to ensure that your condition resolves.  Equal important is that you minimize alcohol intake.  Return here for concerning changes.

## 2014-07-15 NOTE — ED Notes (Signed)
Pt denies ETOH for the past 10 days.

## 2014-07-15 NOTE — ED Provider Notes (Signed)
CSN: [id number]     Arrival date & time 07/15/14  1817 History   First MD Initiated Contact with Patient 07/15/14 1958     Chief Complaint  Patient presents with  . Abdominal Pain     (Consider location/radiation/quality/duration/timing/severity/associated sxs/prior Treatment) HPI  patient presents with concern of ongoing abdominal pain.  has been present for some time, and the patient cannot specify when this episode became worse, though it may have been about 10 days ago.  since about that time patient has had upper abdominal pain, diffuse, sore per Patient takes no medication for this.  he gives an unclear account if he is still drinking or not.  he is smoking cigarettes.  he denies vomiting, diarrhea, fever, chills.  Past Medical History  Diagnosis Date  . Hypertension   . Asthma   . GERD (gastroesophageal reflux disease)   . Chronic pain   . Schizophrenia   . Alcohol abuse   . Seizures   . Pancreatitis   . Depression   . CAD (coronary artery disease)    Past Surgical History  Procedure Laterality Date  . Left heart catheterization with coronary angiogram N/A 07/06/2013    Procedure: LEFT HEART CATHETERIZATION WITH CORONARY ANGIOGRAM;  Surgeon: Clent Demark, MD;  Location: [facility] CATH LAB;  Service: Cardiovascular;  Laterality: N/A;  . Percutaneous coronary stent intervention (pci-s)  07/06/2013    Procedure: PERCUTANEOUS CORONARY STENT INTERVENTION (PCI-S);  Surgeon: Clent Demark, MD;  Location: [facility] CATH LAB;  Service: Cardiovascular;;   Family History  Problem Relation Age of Onset  . Malignant hyperthermia Mother   . Cirrhosis Father   . Alcohol abuse Father    History  Substance Use Topics  . Smoking status: Current Every Day Smoker -- 0.00 packs/day    Types: Cigarettes  . Smokeless tobacco: Not on file  . Alcohol Use: Yes     Comment: daily heavy drinker    Review of Systems  Constitutional:       Per HPI, otherwise negative  HENT:       Per HPI,  otherwise negative  Respiratory:       Per HPI, otherwise negative  Cardiovascular:       Per HPI, otherwise negative  Gastrointestinal: Negative for vomiting.  Endocrine:       Negative aside from HPI  Genitourinary:       Neg aside from HPI   Musculoskeletal:       Per HPI, otherwise negative  Skin: Negative.   Neurological: Negative for syncope.      Allergies  Hctz; Hydroxyzine; Sulfonamide derivatives; and Cetirizine & related  Home Medications   Prior to Admission medications   Medication Sig Start Date End Date Taking? Authorizing Provider  aspirin 81 MG chewable tablet Chew 1 tablet (81 mg total) by mouth daily. 11/23/13  Yes Kristen N Faust, DO  atorvastatin (LIPITOR) 80 MG tablet Take 1 tablet (80 mg total) by mouth daily. Patient taking differently: Take 80 mg by mouth at bedtime.  02/13/14  Yes Nicole Pisciotta, PA-C  benztropine (COGENTIN) 0.5 MG tablet Take 0.5 mg by mouth 2 (two) times daily.   Yes Historical Provider, MD  clopidogrel (PLAVIX) 75 MG tablet Take 1 tablet (75 mg total) by mouth daily. 02/13/14  Yes Nicole Pisciotta, PA-C  divalproex (DEPAKOTE) 500 MG DR tablet Take 1 tablet (500 mg total) by mouth 2 (two) times daily. 02/13/14  Yes Nicole Pisciotta, PA-C  famotidine (PEPCID) 20 MG tablet Take 1  tablet (20 mg total) by mouth 2 (two) times daily. 02/13/14  Yes Nicole Pisciotta, PA-C  feeding supplement, ENSURE ENLIVE, (ENSURE ENLIVE) LIQD Take 237 mLs by mouth 2 (two) times daily between meals. 06/11/14  Yes Eugenie Filler, MD  folic acid (FOLVITE) 1 MG tablet Take 1 tablet (1 mg total) by mouth daily. 06/11/14  Yes Eugenie Filler, MD  haloperidol decanoate (HALDOL DECANOATE) 100 MG/ML injection Inject 1 mL into the muscle every 28 (twenty-eight) days. 05/24/14  Yes Historical Provider, MD  hydrALAZINE (APRESOLINE) 10 MG tablet Take 1 tablet (10 mg total) by mouth 2 (two) times daily. 02/13/14  Yes Nicole Pisciotta, PA-C  levETIRAcetam (KEPPRA) 250 MG tablet  Take 5 tablets (1,250 mg total) by mouth 2 (two) times daily. 06/18/14  Yes Everlene Balls, MD  LORazepam (ATIVAN) 0.5 MG tablet Take 0.5 mg by mouth at bedtime as needed for anxiety.   Yes Historical Provider, MD  metoprolol tartrate (LOPRESSOR) 25 MG tablet Take 0.5 tablets (12.5 mg total) by mouth 2 (two) times daily. Patient taking differently: Take 25 mg by mouth daily.  02/13/14  Yes Nicole Pisciotta, PA-C  Multiple Vitamin (MULTIVITAMIN WITH MINERALS) TABS tablet Take 1 tablet by mouth daily. 06/11/14  Yes Eugenie Filler, MD  QUEtiapine (SEROQUEL) 50 MG tablet Take 1 tablet (50 mg total) by mouth at bedtime. 02/13/14  Yes Nicole Pisciotta, PA-C  thiamine 100 MG tablet Take 1 tablet (100 mg total) by mouth daily. 06/11/14  Yes Eugenie Filler, MD  traZODone (DESYREL) 100 MG tablet Take 1 tablet by mouth at bedtime as needed for sleep.  05/13/14  Yes Historical Provider, MD  vitamin B-12 500 MCG tablet Take 1 tablet (500 mcg total) by mouth daily. 07/02/14  Yes David Tat, MD   BP 122/93 mmHg  Pulse 87  Temp(Src) 98.4 F (36.9 C) (Oral)  Resp 18  Wt 144 lb (65.318 kg)  SpO2 98% Physical Exam  Constitutional: He is oriented to person, place, and time. He appears cachectic. He has a sickly appearance. No distress.  HENT:  Head: Normocephalic and atraumatic.  Eyes: Conjunctivae and EOM are normal.  Cardiovascular: Normal rate and regular rhythm.   Pulmonary/Chest: Effort normal. No stridor. No respiratory distress.  Abdominal: He exhibits no distension.    Musculoskeletal: He exhibits no edema.  Neurological: He is alert and oriented to person, place, and time.  Speech is slow but clear. MAES   Skin: Skin is warm and dry.  Psychiatric: His speech is delayed. He is slowed and withdrawn.  Nursing note and vitals reviewed.   ED Course  Procedures (including critical care time) Labs Review Labs Reviewed  LIPASE, BLOOD - Abnormal; Notable for the following:    Lipase 213 (*)    All  other components within normal limits  COMPREHENSIVE METABOLIC PANEL - Abnormal; Notable for the following:    Chloride 99 (*)    Glucose, Bld 103 (*)    AST 98 (*)    ALT 181 (*)    Alkaline Phosphatase 249 (*)    All other components within normal limits  CBC  ETHANOL  URINALYSIS, ROUTINE W REFLEX MICROSCOPIC (NOT AT [facility])    Chart review demonstrates 17 ED visits in 42m.   On repeat exam the patient appears calm. He states that he wants to leave to smoke a cigarette. Patient left for additional evaluation. MDM  Patient presents with ongoing abdominal pain. Patient has a history of acute on chronic pancreatitis, and  today he is awake, alert, hemodynamically stable, with no peritoneal findings. Patient's labs do suggest ongoing pancreatitis though his lipase is diminished since last week. Liver enzymes elevated suggesting ongoing alcohol use, though no evidence for acute intoxication currently. Patient left to smoke a cigarette prior to completion of his evaluation.  Carmin Muskrat, MD 07/15/14 2138

## 2014-07-15 NOTE — ED Notes (Addendum)
He c/o severe lowe rabd pain constant x 3 days. He states nothing increases or decreases the pain. He denies n/v/bowel/bladder changes. He is A&Ox4, resp e/u

## 2014-07-15 NOTE — ED Notes (Signed)
Pt arrived to room

## 2014-07-16 ENCOUNTER — Encounter (HOSPITAL_COMMUNITY): Payer: Self-pay | Admitting: Neurology

## 2014-07-16 ENCOUNTER — Emergency Department (HOSPITAL_COMMUNITY)
Admission: EM | Admit: 2014-07-16 | Discharge: 2014-07-16 | Discharge status: Against Medical Advice | Payer: Medicare Other | Attending: Dermatology | Admitting: Dermatology

## 2014-07-16 DIAGNOSIS — I10 Essential (primary) hypertension: Secondary | ICD-10-CM | POA: Insufficient documentation

## 2014-07-16 DIAGNOSIS — G8929 Other chronic pain: Secondary | ICD-10-CM | POA: Insufficient documentation

## 2014-07-16 DIAGNOSIS — J45909 Unspecified asthma, uncomplicated: Secondary | ICD-10-CM | POA: Insufficient documentation

## 2014-07-16 DIAGNOSIS — I2510 Atherosclerotic heart disease of native coronary artery without angina pectoris: Secondary | ICD-10-CM | POA: Insufficient documentation

## 2014-07-16 DIAGNOSIS — Z720 Tobacco use: Secondary | ICD-10-CM | POA: Insufficient documentation

## 2014-07-16 DIAGNOSIS — R1030 Lower abdominal pain, unspecified: Secondary | ICD-10-CM | POA: Insufficient documentation

## 2014-07-16 LAB — CBC
HEMATOCRIT: 39.1 % (ref 39.0–52.0)
Hemoglobin: 13.2 g/dL (ref 13.0–17.0)
MCH: 31.7 pg (ref 26.0–34.0)
MCHC: 33.8 g/dL (ref 30.0–36.0)
MCV: 93.8 fL (ref 78.0–100.0)
Platelets: 331 10*3/uL (ref 150–400)
RBC: 4.17 MIL/uL — AB (ref 4.22–5.81)
RDW: 14.9 % (ref 11.5–15.5)
WBC: 7.3 10*3/uL (ref 4.0–10.5)

## 2014-07-16 LAB — COMPREHENSIVE METABOLIC PANEL
ALK PHOS: 354 U/L — AB (ref 38–126)
ALT: 325 U/L — ABNORMAL HIGH (ref 17–63)
AST: 182 U/L — ABNORMAL HIGH (ref 15–41)
Albumin: 3.8 g/dL (ref 3.5–5.0)
Anion gap: 8 (ref 5–15)
BUN: 11 mg/dL (ref 6–20)
CHLORIDE: 98 mmol/L — AB (ref 101–111)
CO2: 29 mmol/L (ref 22–32)
Calcium: 9.1 mg/dL (ref 8.9–10.3)
Creatinine, Ser: 0.83 mg/dL (ref 0.61–1.24)
GFR calc non Af Amer: >60 mL/min (ref >60)
Glucose, Bld: 89 mg/dL (ref 65–99)
POTASSIUM: 4.7 mmol/L (ref 3.5–5.1)
Sodium: 135 mmol/L (ref 135–145)
Total Bilirubin: 0.7 mg/dL (ref 0.3–1.2)
Total Protein: 7.2 g/dL (ref 6.5–8.1)

## 2014-07-16 LAB — LIPASE, BLOOD: LIPASE: 196 U/L — AB (ref 22–51)

## 2014-07-16 LAB — ETHANOL

## 2014-07-16 NOTE — ED Notes (Signed)
Pt reports lower abd pain for 10 days. Denies n/v/d.

## 2014-07-16 NOTE — ED Notes (Signed)
The patient said he was not going to wait.  The patient had only been waiting for 49 minutes and I advised him that the longest wait had been here for three hours.  He advised me he would come back at a later time.

## 2014-07-18 ENCOUNTER — Emergency Department (HOSPITAL_COMMUNITY): Payer: Medicare Other

## 2014-07-18 ENCOUNTER — Inpatient Hospital Stay (HOSPITAL_COMMUNITY)
Admission: EM | Admit: 2014-07-18 | Discharge: 2014-07-18 | DRG: 445 | Discharge status: Against Medical Advice | Payer: Medicare Other | Attending: Family Medicine | Admitting: Family Medicine

## 2014-07-18 ENCOUNTER — Encounter (HOSPITAL_COMMUNITY): Payer: Self-pay | Admitting: Neurology

## 2014-07-18 DIAGNOSIS — J45909 Unspecified asthma, uncomplicated: Secondary | ICD-10-CM | POA: Diagnosis present

## 2014-07-18 DIAGNOSIS — F329 Major depressive disorder, single episode, unspecified: Secondary | ICD-10-CM | POA: Diagnosis present

## 2014-07-18 DIAGNOSIS — R7989 Other specified abnormal findings of blood chemistry: Secondary | ICD-10-CM

## 2014-07-18 DIAGNOSIS — K8051 Calculus of bile duct without cholangitis or cholecystitis with obstruction: Secondary | ICD-10-CM | POA: Diagnosis present

## 2014-07-18 DIAGNOSIS — Z888 Allergy status to other drugs, medicaments and biological substances status: Secondary | ICD-10-CM | POA: Diagnosis not present

## 2014-07-18 DIAGNOSIS — F209 Schizophrenia, unspecified: Secondary | ICD-10-CM | POA: Diagnosis present

## 2014-07-18 DIAGNOSIS — G40909 Epilepsy, unspecified, not intractable, without status epilepticus: Secondary | ICD-10-CM | POA: Diagnosis present

## 2014-07-18 DIAGNOSIS — E785 Hyperlipidemia, unspecified: Secondary | ICD-10-CM | POA: Diagnosis present

## 2014-07-18 DIAGNOSIS — E7800 Pure hypercholesterolemia, unspecified: Secondary | ICD-10-CM | POA: Diagnosis present

## 2014-07-18 DIAGNOSIS — F17210 Nicotine dependence, cigarettes, uncomplicated: Secondary | ICD-10-CM | POA: Diagnosis present

## 2014-07-18 DIAGNOSIS — I10 Essential (primary) hypertension: Secondary | ICD-10-CM | POA: Diagnosis present

## 2014-07-18 DIAGNOSIS — K852 Alcohol induced acute pancreatitis without necrosis or infection: Secondary | ICD-10-CM | POA: Insufficient documentation

## 2014-07-18 DIAGNOSIS — K831 Obstruction of bile duct: Secondary | ICD-10-CM | POA: Diagnosis not present

## 2014-07-18 DIAGNOSIS — G931 Anoxic brain damage, not elsewhere classified: Secondary | ICD-10-CM | POA: Diagnosis present

## 2014-07-18 DIAGNOSIS — F202 Catatonic schizophrenia: Secondary | ICD-10-CM

## 2014-07-18 DIAGNOSIS — B852 Pediculosis, unspecified: Secondary | ICD-10-CM

## 2014-07-18 DIAGNOSIS — Z720 Tobacco use: Secondary | ICD-10-CM

## 2014-07-18 DIAGNOSIS — E860 Dehydration: Secondary | ICD-10-CM | POA: Diagnosis present

## 2014-07-18 DIAGNOSIS — K219 Gastro-esophageal reflux disease without esophagitis: Secondary | ICD-10-CM | POA: Diagnosis present

## 2014-07-18 DIAGNOSIS — Z79899 Other long term (current) drug therapy: Secondary | ICD-10-CM | POA: Diagnosis not present

## 2014-07-18 DIAGNOSIS — Z882 Allergy status to sulfonamides status: Secondary | ICD-10-CM | POA: Diagnosis not present

## 2014-07-18 DIAGNOSIS — R945 Abnormal results of liver function studies: Secondary | ICD-10-CM

## 2014-07-18 DIAGNOSIS — Z7982 Long term (current) use of aspirin: Secondary | ICD-10-CM | POA: Diagnosis not present

## 2014-07-18 DIAGNOSIS — G8929 Other chronic pain: Secondary | ICD-10-CM | POA: Diagnosis present

## 2014-07-18 DIAGNOSIS — F1010 Alcohol abuse, uncomplicated: Secondary | ICD-10-CM | POA: Diagnosis present

## 2014-07-18 DIAGNOSIS — Z955 Presence of coronary angioplasty implant and graft: Secondary | ICD-10-CM | POA: Diagnosis not present

## 2014-07-18 DIAGNOSIS — I2510 Atherosclerotic heart disease of native coronary artery without angina pectoris: Secondary | ICD-10-CM | POA: Diagnosis present

## 2014-07-18 LAB — COMPREHENSIVE METABOLIC PANEL
ALK PHOS: 453 U/L — AB (ref 38–126)
ALT: 326 U/L — AB (ref 17–63)
ANION GAP: 12 (ref 5–15)
AST: 241 U/L — ABNORMAL HIGH (ref 15–41)
Albumin: 3.8 g/dL (ref 3.5–5.0)
BUN: 10 mg/dL (ref 6–20)
CO2: 25 mmol/L (ref 22–32)
Calcium: 9.2 mg/dL (ref 8.9–10.3)
Chloride: 99 mmol/L — ABNORMAL LOW (ref 101–111)
Creatinine, Ser: 0.74 mg/dL (ref 0.61–1.24)
GFR calc Af Amer: >60 mL/min (ref >60)
GFR calc non Af Amer: >60 mL/min (ref >60)
Glucose, Bld: 153 mg/dL — ABNORMAL HIGH (ref 65–99)
POTASSIUM: 4 mmol/L (ref 3.5–5.1)
Sodium: 136 mmol/L (ref 135–145)
Total Bilirubin: 0.6 mg/dL (ref 0.3–1.2)
Total Protein: 6.8 g/dL (ref 6.5–8.1)

## 2014-07-18 LAB — ETHANOL: Alcohol, Ethyl (B): <5 mg/dL (ref <5)

## 2014-07-18 LAB — URINALYSIS, ROUTINE W REFLEX MICROSCOPIC
Bilirubin Urine: NEGATIVE
Glucose, UA: NEGATIVE mg/dL
HGB URINE DIPSTICK: NEGATIVE
KETONES UR: NEGATIVE mg/dL
Leukocytes, UA: NEGATIVE
Nitrite: NEGATIVE
PROTEIN: NEGATIVE mg/dL
Specific Gravity, Urine: 1.009 (ref 1.005–1.030)
UROBILINOGEN UA: 1 mg/dL (ref 0.0–1.0)
pH: 6.5 (ref 5.0–8.0)

## 2014-07-18 LAB — RAPID URINE DRUG SCREEN, HOSP PERFORMED
Amphetamines: NOT DETECTED
BARBITURATES: NOT DETECTED
Benzodiazepines: NOT DETECTED
COCAINE: NOT DETECTED
Opiates: NOT DETECTED
Tetrahydrocannabinol: NOT DETECTED

## 2014-07-18 LAB — CBC
HEMATOCRIT: 38.8 % — AB (ref 39.0–52.0)
Hemoglobin: 13.3 g/dL (ref 13.0–17.0)
MCH: 32 pg (ref 26.0–34.0)
MCHC: 34.3 g/dL (ref 30.0–36.0)
MCV: 93.3 fL (ref 78.0–100.0)
Platelets: 317 10*3/uL (ref 150–400)
RBC: 4.16 MIL/uL — ABNORMAL LOW (ref 4.22–5.81)
RDW: 14.9 % (ref 11.5–15.5)
WBC: 8 10*3/uL (ref 4.0–10.5)

## 2014-07-18 LAB — AMMONIA: AMMONIA: 52 umol/L — AB (ref 9–35)

## 2014-07-18 LAB — I-STAT CG4 LACTIC ACID, ED
LACTIC ACID, VENOUS: 1.16 mmol/L (ref 0.5–2.0)
LACTIC ACID, VENOUS: 0.58 mmol/L (ref 0.5–2.0)

## 2014-07-18 LAB — TROPONIN I: TROPONIN I: 0.06 ng/mL — AB (ref <0.031)

## 2014-07-18 LAB — LIPASE, BLOOD: Lipase: 387 U/L — ABNORMAL HIGH (ref 22–51)

## 2014-07-18 MED ORDER — LORAZEPAM 1 MG PO TABS: 1.0000 mg | ORAL_TABLET | Freq: Four times a day (QID) | ORAL | Status: DC | PRN

## 2014-07-18 MED ORDER — SODIUM CHLORIDE 0.9 % IV BOLUS (SEPSIS): 1000.0000 mL | INTRAVENOUS | Status: DC | PRN

## 2014-07-18 MED ORDER — ALUM & MAG HYDROXIDE-SIMETH 200-200-20 MG/5ML PO SUSP: 30.0000 mL | Freq: Four times a day (QID) | ORAL | Status: DC | PRN

## 2014-07-18 MED ORDER — ONDANSETRON HCL 4 MG PO TABS
4.0000 mg | ORAL_TABLET | Freq: Four times a day (QID) | ORAL | Status: DC | PRN
Start: 2014-07-18 — End: 2014-07-18

## 2014-07-18 MED ORDER — NICOTINE 21 MG/24HR TD PT24
21.0000 mg | MEDICATED_PATCH | Freq: Every day | TRANSDERMAL | Status: DC
  Filled 2014-07-18: qty 1

## 2014-07-18 MED ORDER — LEVETIRACETAM 750 MG PO TABS
1250.0000 mg | ORAL_TABLET | Freq: Two times a day (BID) | ORAL | Status: DC
  Filled 2014-07-18: qty 1

## 2014-07-18 MED ORDER — DIVALPROEX SODIUM 500 MG PO DR TAB
500.0000 mg | DELAYED_RELEASE_TABLET | Freq: Two times a day (BID) | ORAL | Status: DC
  Filled 2014-07-18: qty 1

## 2014-07-18 MED ORDER — FENTANYL CITRATE (PF) 100 MCG/2ML IJ SOLN
50.0000 ug | Freq: Once | INTRAMUSCULAR | Status: AC
  Administered 2014-07-18: 50 ug via INTRAVENOUS
  Filled 2014-07-18: qty 2

## 2014-07-18 MED ORDER — PERMETHRIN 5 % EX CREA
TOPICAL_CREAM | Freq: Once | CUTANEOUS | Status: DC
  Filled 2014-07-18: qty 60

## 2014-07-18 MED ORDER — SODIUM CHLORIDE 0.9 % IV SOLN: INTRAVENOUS | Status: DC

## 2014-07-18 MED ORDER — HALOPERIDOL DECANOATE 100 MG/ML IM SOLN: 100.0000 mg | INTRAMUSCULAR | Status: DC

## 2014-07-18 MED ORDER — PERMETHRIN 0.25 % LIQD
Freq: Once | Status: DC
  Filled 2014-07-18: qty 147.86

## 2014-07-18 MED ORDER — M.V.I. ADULT IV INJ
INJECTION | Freq: Once | INTRAVENOUS | Status: AC
  Administered 2014-07-18: 16:00:00 via INTRAVENOUS
  Filled 2014-07-18 (×2): qty 1000

## 2014-07-18 MED ORDER — VITAMIN B-1 100 MG PO TABS
100.0000 mg | ORAL_TABLET | Freq: Every day | ORAL | Status: DC
  Filled 2014-07-18: qty 1

## 2014-07-18 MED ORDER — QUETIAPINE FUMARATE 50 MG PO TABS: 50.0000 mg | ORAL_TABLET | Freq: Every day | ORAL | Status: DC

## 2014-07-18 MED ORDER — ENOXAPARIN SODIUM 40 MG/0.4ML ~~LOC~~ SOLN
40.0000 mg | SUBCUTANEOUS | Status: DC
  Filled 2014-07-18: qty 0.4

## 2014-07-18 MED ORDER — FOLIC ACID 1 MG PO TABS
1.0000 mg | ORAL_TABLET | Freq: Every day | ORAL | Status: DC
  Filled 2014-07-18: qty 1

## 2014-07-18 MED ORDER — ONDANSETRON HCL 4 MG/2ML IJ SOLN: 4.0000 mg | Freq: Four times a day (QID) | INTRAMUSCULAR | Status: DC | PRN

## 2014-07-18 MED ORDER — FAMOTIDINE 20 MG PO TABS
20.0000 mg | ORAL_TABLET | Freq: Two times a day (BID) | ORAL | Status: DC
Start: 2014-07-18 — End: 2014-07-18

## 2014-07-18 MED ORDER — SODIUM CHLORIDE 0.9 % IV SOLN
INTRAVENOUS | Status: DC
  Administered 2014-07-18: 15:00:00 via INTRAVENOUS

## 2014-07-18 MED ORDER — HYDRALAZINE HCL 10 MG PO TABS: 10.0000 mg | ORAL_TABLET | Freq: Two times a day (BID) | ORAL | Status: DC

## 2014-07-18 MED ORDER — TRAZODONE HCL 100 MG PO TABS
100.0000 mg | ORAL_TABLET | Freq: Every evening | ORAL | Status: DC | PRN
Start: 2014-07-18 — End: 2014-07-18

## 2014-07-18 MED ORDER — HYDROMORPHONE HCL 1 MG/ML IJ SOLN
0.5000 mg | INTRAMUSCULAR | Status: DC | PRN
  Filled 2014-07-18: qty 1

## 2014-07-18 MED ORDER — LORAZEPAM 2 MG/ML IJ SOLN: 1.0000 mg | Freq: Four times a day (QID) | INTRAMUSCULAR | Status: DC | PRN

## 2014-07-18 MED ORDER — ACETAMINOPHEN 650 MG RE SUPP: 650.0000 mg | Freq: Four times a day (QID) | RECTAL | Status: DC | PRN

## 2014-07-18 MED ORDER — SENNOSIDES-DOCUSATE SODIUM 8.6-50 MG PO TABS
1.0000 | ORAL_TABLET | Freq: Every evening | ORAL | Status: DC | PRN
Start: 2014-07-18 — End: 2014-07-18

## 2014-07-18 MED ORDER — FOLIC ACID 1 MG PO TABS
1.0000 mg | ORAL_TABLET | Freq: Every day | ORAL | Status: DC
  Administered 2014-07-18: 1 mg via ORAL
  Filled 2014-07-18: qty 1

## 2014-07-18 MED ORDER — SODIUM CHLORIDE 0.9 % IV BOLUS (SEPSIS)
1000.0000 mL | Freq: Once | INTRAVENOUS | Status: AC
  Administered 2014-07-18: 1000 mL via INTRAVENOUS

## 2014-07-18 MED ORDER — LORAZEPAM 0.5 MG PO TABS: 0.5000 mg | ORAL_TABLET | Freq: Every evening | ORAL | Status: DC | PRN

## 2014-07-18 MED ORDER — THIAMINE HCL 100 MG/ML IJ SOLN
100.0000 mg | Freq: Every day | INTRAMUSCULAR | Status: DC
Start: 2014-07-18 — End: 2014-07-18

## 2014-07-18 MED ORDER — ADULT MULTIVITAMIN W/MINERALS CH: 1.0000 | ORAL_TABLET | Freq: Every day | ORAL | Status: DC

## 2014-07-18 MED ORDER — ACETAMINOPHEN 325 MG PO TABS: 650.0000 mg | ORAL_TABLET | Freq: Four times a day (QID) | ORAL | Status: DC | PRN

## 2014-07-18 MED ORDER — LACTULOSE 10 GM/15ML PO SOLN
20.0000 g | Freq: Two times a day (BID) | ORAL | Status: DC
  Filled 2014-07-18: qty 30

## 2014-07-18 MED ORDER — ONDANSETRON HCL 4 MG/2ML IJ SOLN: 4.0000 mg | Freq: Three times a day (TID) | INTRAMUSCULAR | Status: DC | PRN

## 2014-07-18 MED ORDER — BENZTROPINE MESYLATE 0.5 MG PO TABS
0.5000 mg | ORAL_TABLET | Freq: Two times a day (BID) | ORAL | Status: DC
  Filled 2014-07-18: qty 1

## 2014-07-18 MED ORDER — ASPIRIN 81 MG PO CHEW: 81.0000 mg | CHEWABLE_TABLET | Freq: Every day | ORAL | Status: DC

## 2014-07-18 MED ORDER — VITAMIN B-1 100 MG PO TABS
100.0000 mg | ORAL_TABLET | Freq: Every day | ORAL | Status: DC
  Administered 2014-07-18: 100 mg via ORAL
  Filled 2014-07-18: qty 1

## 2014-07-18 NOTE — Progress Notes (Signed)
Patient assisting on leaving AMA. Says he will use his bus pass and take the bus home. I educated pt and encouraged him to stay. Pt verbalizes understanding, but continues to want to leave. Pt alert and oriented x4. Steady on his feet. Lamar Blinks NP notified.

## 2014-07-18 NOTE — ED Provider Notes (Signed)
CSN: [id number]     Arrival date & time 07/18/14  6629 History   First MD Initiated Contact with Patient 07/18/14 [phone]     Chief Complaint  Patient presents with  . Abdominal Pain     (Consider location/radiation/quality/duration/timing/severity/associated sxs/prior Treatment) The history is provided by the patient and medical records.     Pt with hx coronary artery disease, hypertension, schizophrenia, alcohol dependence, seizure disorder, chronic pancreatitis with recent admission for pancreatitis (6/26-6/29) and two recent ED visits for abdominal pain  (7/4, 7/11) p/w upper abdominal pain, sharp, worse with palpation.  Pt also notes chest pain.  He is unable to give many details about his symptoms.  Denies fevers, chills, SOB, N/V/D.  States he has not had any alcohol in 10 days.  [place] in [place] ED today.  Denies any hx abdominal pain.   Level V caveat for acuity of condition vs psychiatric disorder   Past Medical History  Diagnosis Date  . Hypertension   . Asthma   . GERD (gastroesophageal reflux disease)   . Chronic pain   . Schizophrenia   . Alcohol abuse   . Seizures   . Pancreatitis   . Depression   . CAD (coronary artery disease)    Past Surgical History  Procedure Laterality Date  . Left heart catheterization with coronary angiogram N/A 07/06/2013    Procedure: LEFT HEART CATHETERIZATION WITH CORONARY ANGIOGRAM;  Surgeon: Clent Demark, MD;  Location: [facility] CATH LAB;  Service: Cardiovascular;  Laterality: N/A;  . Percutaneous coronary stent intervention (pci-s)  07/06/2013    Procedure: PERCUTANEOUS CORONARY STENT INTERVENTION (PCI-S);  Surgeon: Clent Demark, MD;  Location: [facility] CATH LAB;  Service: Cardiovascular;;   Family History  Problem Relation Age of Onset  . Malignant hyperthermia Mother   . Cirrhosis Father   . Alcohol abuse Father    History  Substance Use Topics  . Smoking status: Current Every Day  Smoker -- 0.00 packs/day    Types: Cigarettes  . Smokeless tobacco: Not on file  . Alcohol Use: Yes     Comment: daily heavy drinker    Review of Systems  Unable to perform ROS: Acuity of condition  Constitutional: Negative for fever and chills.  Respiratory: Negative for shortness of breath.   Cardiovascular: Positive for chest pain.  Gastrointestinal: Positive for abdominal pain. Negative for nausea, vomiting and diarrhea.  Neurological: Negative for weakness and numbness.  Psychiatric/Behavioral: Negative for self-injury.      Allergies  Hctz; Hydroxyzine; Sulfonamide derivatives; and Cetirizine & related  Home Medications   Prior to Admission medications   Medication Sig Start Date End Date Taking? Authorizing Provider  aspirin 81 MG chewable tablet Chew 1 tablet (81 mg total) by mouth daily. 11/23/13   Kristen N Mullane, DO  atorvastatin (LIPITOR) 80 MG tablet Take 1 tablet (80 mg total) by mouth daily. Patient taking differently: Take 80 mg by mouth at bedtime.  02/13/14   Nicole Pisciotta, PA-C  benztropine (COGENTIN) 0.5 MG tablet Take 0.5 mg by mouth 2 (two) times daily.    Historical Provider, MD  divalproex (DEPAKOTE) 500 MG DR tablet Take 1 tablet (500 mg total) by mouth 2 (two) times daily. 02/13/14   Nicole Pisciotta, PA-C  famotidine (PEPCID) 20 MG tablet Take 1 tablet (20 mg total) by mouth 2 (two) times daily. 02/13/14   Nicole Pisciotta, PA-C  feeding supplement, ENSURE ENLIVE, (ENSURE ENLIVE) LIQD Take  237 mLs by mouth 2 (two) times daily between meals. 06/11/14   Eugenie Filler, MD  folic acid (FOLVITE) 1 MG tablet Take 1 tablet (1 mg total) by mouth daily. 06/11/14   Eugenie Filler, MD  haloperidol decanoate (HALDOL DECANOATE) 100 MG/ML injection Inject 1 mL into the muscle every 28 (twenty-eight) days. 05/24/14   Historical Provider, MD  hydrALAZINE (APRESOLINE) 10 MG tablet Take 1 tablet (10 mg total) by mouth 2 (two) times daily. 02/13/14   Nicole Pisciotta, PA-C   levETIRAcetam (KEPPRA) 250 MG tablet Take 5 tablets (1,250 mg total) by mouth 2 (two) times daily. 06/18/14   Everlene Balls, MD  LORazepam (ATIVAN) 0.5 MG tablet Take 0.5 mg by mouth at bedtime as needed for anxiety.    Historical Provider, MD  metoprolol tartrate (LOPRESSOR) 25 MG tablet Take 0.5 tablets (12.5 mg total) by mouth 2 (two) times daily. Patient taking differently: Take 25 mg by mouth daily.  02/13/14   Nicole Pisciotta, PA-C  Multiple Vitamin (MULTIVITAMIN WITH MINERALS) TABS tablet Take 1 tablet by mouth daily. 06/11/14   Eugenie Filler, MD  QUEtiapine (SEROQUEL) 50 MG tablet Take 1 tablet (50 mg total) by mouth at bedtime. 02/13/14   Nicole Pisciotta, PA-C  thiamine 100 MG tablet Take 1 tablet (100 mg total) by mouth daily. 06/11/14   Eugenie Filler, MD  traZODone (DESYREL) 100 MG tablet Take 1 tablet by mouth at bedtime as needed for sleep.  05/13/14   Historical Provider, MD  vitamin B-12 500 MCG tablet Take 1 tablet (500 mcg total) by mouth daily. 07/02/14   Orson Eva, MD   BP 118/74 mmHg  Pulse 94  Temp(Src) 98.3 F (36.8 C) (Oral)  Resp 17  SpO2 96% Physical Exam  Constitutional: He appears well-developed and well-nourished. No distress.  Awake, alert, not able to give much detail in answer to questions  HENT:  Head: Normocephalic and atraumatic.  Neck: Normal range of motion. Neck supple.  Cardiovascular: Normal rate and regular rhythm.   Pulmonary/Chest: Effort normal and breath sounds normal. No respiratory distress. He has no wheezes. He has no rales.  Abdominal: Soft. He exhibits no distension and no mass. There is tenderness in the right upper quadrant and epigastric area. There is no rebound and no guarding.  Neurological: He is alert. He exhibits normal muscle tone.  Skin: He is not diaphoretic.  Psychiatric: He is slowed.  Flat affect  Nursing note and vitals reviewed.   ED Course  Procedures (including critical care time) Labs Review Labs Reviewed   LIPASE, BLOOD - Abnormal; Notable for the following:    Lipase 387 (*)    All other components within normal limits  COMPREHENSIVE METABOLIC PANEL - Abnormal; Notable for the following:    Chloride 99 (*)    Glucose, Bld 153 (*)    AST 241 (*)    ALT 326 (*)    Alkaline Phosphatase 453 (*)    All other components within normal limits  CBC - Abnormal; Notable for the following:    RBC 4.16 (*)    HCT 38.8 (*)    All other components within normal limits  URINALYSIS, ROUTINE W REFLEX MICROSCOPIC (NOT AT [facility]) - Abnormal; Notable for the following:    APPearance CLOUDY (*)    All other components within normal limits  AMMONIA - Abnormal; Notable for the following:    Ammonia 52 (*)    All other components within normal limits  ETHANOL  URINE RAPID DRUG SCREEN, HOSP PERFORMED  I-STAT CG4 LACTIC ACID, ED  I-STAT CG4 LACTIC ACID, ED    Imaging Review Dg Chest 2 View  07/18/2014   CLINICAL DATA:  Mid chest pain  EXAM: CHEST - 2 VIEW  COMPARISON:  None.  FINDINGS: The heart size and mediastinal contours are within normal limits. Both lungs are clear. The visualized skeletal structures are unremarkable.  IMPRESSION: No active disease.   Electronically Signed   By: Inez Catalina M.D.   On: 07/18/2014 12:26   US Abdomen Complete  07/18/2014   CLINICAL DATA:  Chronic right upper quadrant abdominal pain.  EXAM: ULTRASOUND ABDOMEN COMPLETE  COMPARISON:  CT scan of January 12, 2014.  FINDINGS: Gallbladder: No wall thickening visualized. No sonographic Murphy sign noted. Non shadowing non mobile echogenic focus measuring 5 mm is noted near gallbladder fundus concerning for adherent stone or polyp. Mild amount of sludge is noted.  Common bile duct: Diameter: 18 mm proximally concerning for distal obstruction. No definite calculus is noted in the visualized portion of the duct.  Liver: Mild intrahepatic ductal dilatation is noted. No other focal abnormality is noted. Echogenicity of parenchyma is within  normal limits.  IVC: No abnormality visualized.  Pancreas: Visualized portion appears normal. Pancreatic tail not visualized due to overlying bowel gas.  Spleen: Size and appearance within normal limits.  Right Kidney: Length: 10.4 cm. Echogenicity within normal limits. No mass or hydronephrosis visualized.  Left Kidney: Length: 12.4 cm. Echogenicity within normal limits. No mass or hydronephrosis visualized.  Abdominal aorta: No aneurysm visualized.  Other findings: None.  IMPRESSION: Small adherent gallstone or polyp is noted toward the fundus of the gallbladder. Minimal amount of sludge is noted within the gallbladder lumen. No gallbladder wall thickening, pericholecystic fluid or sonographic Murphy's sign is noted.  Severely dilated common bile duct is noted as well as mild intrahepatic biliary ductal dilatation. This is concerning for distal common bile duct obstruction and further evaluation with MRCP is recommended.   Electronically Signed   By: Marijo Conception, M.D.   On: 07/18/2014 12:48     EKG Interpretation   Date/Time:  Thursday July 18 2014 11:05:28 EDT Ventricular Rate:  62 PR Interval:  211 QRS Duration: 91 QT Interval:  416 QTC Calculation: 422 R Axis:   80 Text Interpretation:  Sinus rhythm Prolonged PR interval ST elev, probable  normal early repol pattern Confirmed by HARRISON  MD, FORREST ([id number]) on  07/18/2014 11:14:29 AM     12:38 PM Dr Aline Brochure made aware of the patient.   1:44 PM [facility] to see patient in consult, requests hospitalist admission.    2:02 PM I spoke with PA York, [facility] who agrees to admit patient.     MDM   Final diagnoses:  Common bile duct obstruction    Afebrile nontoxic pt with hx alcoholism, chronic pancreatitis with upper abdominal pain.  Elevated lipase, elevated LFTs.  US abdomen with severely dilated common bile duct concerning for obstruction.  Admitted to [facility], [facility] to consult.      Clayton Bibles,  PA-C 07/18/14 [place], MD 07/19/14 [phone]

## 2014-07-18 NOTE — ED Notes (Signed)
MRI reports that pt can have MRI at this time due to Scabies. PA Jessica from GI notified. She informed that pt would have the MRI tomorrow. She is to speak with the MRI department.

## 2014-07-18 NOTE — ED Notes (Signed)
Pt reports abd pain intermittent for several weeks, has been seen this week for same. Denies n/v/d.

## 2014-07-18 NOTE — Progress Notes (Signed)
First 0.5 mg of hydromorphone left over in the ampule wasted with Charito in needle dispenser at Emergency cart.Not given to the patient becouse patient was verbalizing he wanted to go on AMA.Patient as per assessment not in pain.

## 2014-07-18 NOTE — Progress Notes (Signed)
Admitted patient patient from Emergency with diagnosis of pancreatitis secondary to alcohol abuse and suspicious of bed bugs.Placed immediately on isolation,personal belongings place on double up plastics bags with wrap around tape on it.Explained to patient the purpose of it and placing him on isolation,patient agreed.

## 2014-07-18 NOTE — Consult Note (Signed)
Referring Provider: No ref. provider found Primary Care Physician:  [facility] PA Primary Gastroenterologist:  Dr. Deatra Ina  Reason for Consultation:  Elevated LFT's, dilated CBD  HPI: Chase Blackwell is a 49 y.o. male with PMH of HTN, schizophrenia, chronic pain, ? Chronic vs recurrent pancreatitis, CAD, ETOH abuse, seizure disorder.  He has been to [facility] ED several times recently with complaints of abdominal pain.  He is not a great historian but reports pain in his upper abdomen for the past 10 days.  He was just discharged from the hospital at the end of June after an episode of pancreatitis.  LFT's elevated with AST 241, ALT 326, ALP 453.  Total bili is normal.  LFT's were all normal just 8 days ago.  Ultrasound shows the following:  IMPRESSION: Small adherent gallstone or polyp is noted toward the fundus of the gallbladder. Minimal amount of sludge is noted within the gallbladder lumen. No gallbladder wall thickening, pericholecystic fluid or sonographic Murphy's sign is noted.  Severely dilated common bile duct (18 mm) is noted as well as mild intrahepatic biliary ductal dilatation. This is concerning for distal common bile duct obstruction and further evaluation with MRCP is recommended.   Past Medical History  Diagnosis Date  . Hypertension   . Asthma   . GERD (gastroesophageal reflux disease)   . Chronic pain   . Schizophrenia   . Alcohol abuse   . Seizures   . Pancreatitis   . Depression   . CAD (coronary artery disease)     Past Surgical History  Procedure Laterality Date  . Left heart catheterization with coronary angiogram N/A 07/06/2013    Procedure: LEFT HEART CATHETERIZATION WITH CORONARY ANGIOGRAM;  Surgeon: Clent Demark, MD;  Location: [facility] CATH LAB;  Service: Cardiovascular;  Laterality: N/A;  . Percutaneous coronary stent intervention (pci-s)  07/06/2013    Procedure: PERCUTANEOUS CORONARY STENT INTERVENTION (PCI-S);  Surgeon: Clent Demark, MD;   Location: [facility] CATH LAB;  Service: Cardiovascular;;    Prior to Admission medications   Medication Sig Start Date End Date Taking? Authorizing Provider  aspirin 81 MG chewable tablet Chew 1 tablet (81 mg total) by mouth daily. 11/23/13   Kristen N Brod, DO  atorvastatin (LIPITOR) 80 MG tablet Take 1 tablet (80 mg total) by mouth daily. Patient taking differently: Take 80 mg by mouth at bedtime.  02/13/14   Nicole Pisciotta, PA-C  benztropine (COGENTIN) 0.5 MG tablet Take 0.5 mg by mouth 2 (two) times daily.    Historical Provider, MD  divalproex (DEPAKOTE) 500 MG DR tablet Take 1 tablet (500 mg total) by mouth 2 (two) times daily. 02/13/14   Nicole Pisciotta, PA-C  famotidine (PEPCID) 20 MG tablet Take 1 tablet (20 mg total) by mouth 2 (two) times daily. 02/13/14   Nicole Pisciotta, PA-C  feeding supplement, ENSURE ENLIVE, (ENSURE ENLIVE) LIQD Take 237 mLs by mouth 2 (two) times daily between meals. 06/11/14   Eugenie Filler, MD  folic acid (FOLVITE) 1 MG tablet Take 1 tablet (1 mg total) by mouth daily. 06/11/14   Eugenie Filler, MD  haloperidol decanoate (HALDOL DECANOATE) 100 MG/ML injection Inject 1 mL into the muscle every 28 (twenty-eight) days. 05/24/14   Historical Provider, MD  hydrALAZINE (APRESOLINE) 10 MG tablet Take 1 tablet (10 mg total) by mouth 2 (two) times daily. 02/13/14   Nicole Pisciotta, PA-C  levETIRAcetam (KEPPRA) 250 MG tablet Take 5 tablets (1,250 mg total) by mouth 2 (two) times daily.  06/18/14   Everlene Balls, MD  LORazepam (ATIVAN) 0.5 MG tablet Take 0.5 mg by mouth at bedtime as needed for anxiety.    Historical Provider, MD  metoprolol tartrate (LOPRESSOR) 25 MG tablet Take 0.5 tablets (12.5 mg total) by mouth 2 (two) times daily. Patient taking differently: Take 25 mg by mouth daily.  02/13/14   Nicole Pisciotta, PA-C  Multiple Vitamin (MULTIVITAMIN WITH MINERALS) TABS tablet Take 1 tablet by mouth daily. 06/11/14   Eugenie Filler, MD  QUEtiapine (SEROQUEL) 50 MG tablet  Take 1 tablet (50 mg total) by mouth at bedtime. 02/13/14   Nicole Pisciotta, PA-C  thiamine 100 MG tablet Take 1 tablet (100 mg total) by mouth daily. 06/11/14   Eugenie Filler, MD  traZODone (DESYREL) 100 MG tablet Take 1 tablet by mouth at bedtime as needed for sleep.  05/13/14   Historical Provider, MD  vitamin B-12 500 MCG tablet Take 1 tablet (500 mcg total) by mouth daily. 07/02/14   Orson Eva, MD    Current Facility-Administered Medications  Medication Dose Route Frequency Provider Last Rate Last Dose  . fentaNYL (SUBLIMAZE) injection 50 mcg  50 mcg Intravenous Once Clayton Bibles, PA-C       Current Outpatient Prescriptions  Medication Sig Dispense Refill  . aspirin 81 MG chewable tablet Chew 1 tablet (81 mg total) by mouth daily. 30 tablet 1  . atorvastatin (LIPITOR) 80 MG tablet Take 1 tablet (80 mg total) by mouth daily. (Patient taking differently: Take 80 mg by mouth at bedtime. ) 30 tablet 0  . benztropine (COGENTIN) 0.5 MG tablet Take 0.5 mg by mouth 2 (two) times daily.    . divalproex (DEPAKOTE) 500 MG DR tablet Take 1 tablet (500 mg total) by mouth 2 (two) times daily. 60 tablet 0  . famotidine (PEPCID) 20 MG tablet Take 1 tablet (20 mg total) by mouth 2 (two) times daily. 60 tablet 0  . feeding supplement, ENSURE ENLIVE, (ENSURE ENLIVE) LIQD Take 237 mLs by mouth 2 (two) times daily between meals. 270 mL 12  . folic acid (FOLVITE) 1 MG tablet Take 1 tablet (1 mg total) by mouth daily.    . haloperidol decanoate (HALDOL DECANOATE) 100 MG/ML injection Inject 1 mL into the muscle every 28 (twenty-eight) days.    . hydrALAZINE (APRESOLINE) 10 MG tablet Take 1 tablet (10 mg total) by mouth 2 (two) times daily. 60 tablet 0  . levETIRAcetam (KEPPRA) 250 MG tablet Take 5 tablets (1,250 mg total) by mouth 2 (two) times daily. 30 tablet 0  . LORazepam (ATIVAN) 0.5 MG tablet Take 0.5 mg by mouth at bedtime as needed for anxiety.    . metoprolol tartrate (LOPRESSOR) 25 MG tablet Take 0.5  tablets (12.5 mg total) by mouth 2 (two) times daily. (Patient taking differently: Take 25 mg by mouth daily. ) 30 tablet 0  . Multiple Vitamin (MULTIVITAMIN WITH MINERALS) TABS tablet Take 1 tablet by mouth daily.    . QUEtiapine (SEROQUEL) 50 MG tablet Take 1 tablet (50 mg total) by mouth at bedtime. 30 tablet 0  . thiamine 100 MG tablet Take 1 tablet (100 mg total) by mouth daily.    . traZODone (DESYREL) 100 MG tablet Take 1 tablet by mouth at bedtime as needed for sleep.   2  . vitamin B-12 500 MCG tablet Take 1 tablet (500 mcg total) by mouth daily. 30 tablet 1  . [DISCONTINUED] modafinil (PROVIGIL) 100 MG tablet Take one tablet by mouth every  morning 30 tablet 5  . [DISCONTINUED] promethazine (PHENERGAN) 25 MG tablet Take 1 tablet (25 mg total) by mouth every 6 (six) hours as needed for nausea. 15 tablet 0  . [DISCONTINUED] zolpidem (AMBIEN) 5 MG tablet Take 1 tablet (5 mg total) by mouth at bedtime as needed for sleep. 30 tablet 5    Allergies as of 07/18/2014 - Review Complete 07/18/2014  Allergen Reaction Noted  . Hctz [hydrochlorothiazide] Other (See Comments) 02/01/2013  . Hydroxyzine Hives 05/09/2011  . Sulfonamide derivatives Hives   . Cetirizine & related Other (See Comments) 05/09/2011    Family History  Problem Relation Age of Onset  . Malignant hyperthermia Mother   . Cirrhosis Father   . Alcohol abuse Father     History   Social History  . Marital Status: Single    Spouse Name: N/A  . Number of Children: N/A  . Years of Education: N/A   Occupational History  . Not on file.   Social History Main Topics  . Smoking status: Current Every Day Smoker -- 0.00 packs/day    Types: Cigarettes  . Smokeless tobacco: Not on file  . Alcohol Use: Yes     Comment: daily heavy drinker  . Drug Use: Yes    Special: Marijuana  . Sexual Activity: Not on file   Other Topics Concern  . Not on file   Social History Narrative    Review of Systems: Ten point ROS is O/W  negative except as mentioned in HPI.  Physical Exam: Vital signs in last 24 hours: Temp:  [98.3 F (36.8 C)] 98.3 F (36.8 C) (07/14 0919) Pulse Rate:  [61-94] 61 (07/14 1130) Resp:  [13-17] 13 (07/14 1130) BP: (103-118)/(63-90) 118/73 mmHg (07/14 1130) SpO2:  [96 %-100 %] 99 % (07/14 1130)   General:  Alert but sleepy.  Dirty and unkept.  In NAD Head:  Normocephalic and atraumatic. Eyes:  Sclera clear, no icterus.  Conjunctiva pink. Ears:  Normal auditory acuity. Mouth:  No deformity or lesions.   Lungs:  Clear throughout to auscultation.  No wheezes, crackles, or rhonchi.  Heart:  Regular rate and rhythm; no murmurs, clicks, rubs, or gallops. Abdomen:  Soft, non-distended.  BS present.  Mild epigastric TTP without R/R/G  Rectal:  Deferred  Msk:  Symmetrical without gross deformities. Pulses:  Normal pulses noted. Extremities:  Without clubbing or edema. Neurologic:  Alert and  oriented x4;  grossly normal neurologically. Skin:  Intact without significant lesions or rashes. Psych:  Alert and cooperative. Flat affect.  Intake/Output this shift: Total I/O In: 1000 [I.V.:1000] Out: 330 [Urine:330]  Lab Results:  Recent Labs  07/15/14 1849 07/16/14 1748 07/18/14 0930  WBC 6.9 7.3 8.0  HGB 13.5 13.2 13.3  HCT 40.2 39.1 38.8*  PLT 309 331 317   BMET  Recent Labs  07/15/14 1849 07/16/14 1748 07/18/14 0930  NA 139 135 136  K 4.2 4.7 4.0  CL 99* 98* 99*  CO2 31 29 25   GLUCOSE 103* 89 153*  BUN 9 11 10   CREATININE 0.93 0.83 0.74  CALCIUM 9.4 9.1 9.2   LFT  Recent Labs  07/18/14 0930  PROT 6.8  ALBUMIN 3.8  AST 241*  ALT 326*  ALKPHOS 453*  BILITOT 0.6   Studies/Results: Dg Chest 2 View  07/18/2014   CLINICAL DATA:  Mid chest pain  EXAM: CHEST - 2 VIEW  COMPARISON:  None.  FINDINGS: The heart size and mediastinal contours are within normal limits. Both  lungs are clear. The visualized skeletal structures are unremarkable.  IMPRESSION: No active disease.    Electronically Signed   By: Inez Catalina M.D.   On: 07/18/2014 12:26   US Abdomen Complete  07/18/2014   CLINICAL DATA:  Chronic right upper quadrant abdominal pain.  EXAM: ULTRASOUND ABDOMEN COMPLETE  COMPARISON:  CT scan of January 12, 2014.  FINDINGS: Gallbladder: No wall thickening visualized. No sonographic Murphy sign noted. Non shadowing non mobile echogenic focus measuring 5 mm is noted near gallbladder fundus concerning for adherent stone or polyp. Mild amount of sludge is noted.  Common bile duct: Diameter: 18 mm proximally concerning for distal obstruction. No definite calculus is noted in the visualized portion of the duct.  Liver: Mild intrahepatic ductal dilatation is noted. No other focal abnormality is noted. Echogenicity of parenchyma is within normal limits.  IVC: No abnormality visualized.  Pancreas: Visualized portion appears normal. Pancreatic tail not visualized due to overlying bowel gas.  Spleen: Size and appearance within normal limits.  Right Kidney: Length: 10.4 cm. Echogenicity within normal limits. No mass or hydronephrosis visualized.  Left Kidney: Length: 12.4 cm. Echogenicity within normal limits. No mass or hydronephrosis visualized.  Abdominal aorta: No aneurysm visualized.  Other findings: None.  IMPRESSION: Small adherent gallstone or polyp is noted toward the fundus of the gallbladder. Minimal amount of sludge is noted within the gallbladder lumen. No gallbladder wall thickening, pericholecystic fluid or sonographic Murphy's sign is noted.  Severely dilated common bile duct is noted as well as mild intrahepatic biliary ductal dilatation. This is concerning for distal common bile duct obstruction and further evaluation with MRCP is recommended.   Electronically Signed   By: Marijo Conception, M.D.   On: 07/18/2014 12:48    IMPRESSION:  -Biliary obstruction:  CBD 18 mm on ultrasound.  LFT's elevated, but total bili normal.  ? From a gallstone vs stricture from  recurrent/chronic pancreatitis vs pancreatic inflammation causing duct compression, etc.   -Pancreatitis:  ? Chronic or recurrent.  Lipase 387 today. -History of ETOH abuse:  Says no ETOH in 10 days.  PLAN: -Will check MRCP/MR abdomen. -Monitor/trend LFT's. -Bowel rest (can have clear liquids), IVF's, pain control. -Will need to continue to avoid ETOH.  Brees Hounshell D.  07/18/2014, 1:49 PM  Pager number [phone]

## 2014-07-18 NOTE — Progress Notes (Signed)
Remaining 0.5mg  of hydromorphone wasted with Genelle Bal at the pyxis sink.

## 2014-07-18 NOTE — H&P (Signed)
[facility] History and Physical                                                                                    Chase Blackwell, is a 49 y.o. male  MRN: [id number]   DOB - 1965/11/01  Admit Date - 07/18/2014  Outpatient Primary MD for the patient is [facility]  Referring Physician:  Clayton Bibles, PA-C  Chief Complaint:   Chief Complaint  Patient presents with  . Abdominal Pain     HPI  Chase Blackwell  is a 49 y.o. male, with an anoxic brain injury, hypertension, chronic pain, coronary artery disease, seizures, history of alcohol abuse. Who was recently admitted in the beginning of June with seizure and at the end of June with pancreatitis. He presents today with abdominal pain and is found to have instructed choledocholithiasis. On my exam the patient is very lethargic having received 2 doses of fentanyl in the ER. He gives little history but states that he has had abdominal pain for a long time. He denies vomiting and diarrhea. He reports that he has not had an alcoholic drink in 10 days. He tells me he was at home with his brother.  Other than that I am unable to get much history.  Workup in the ER shows elevated LFTs alkaline phosphatase 453, AST 241, ALT 326.  His lipase is mildly elevated at 387.  Abdominal ultrasound shows a severely dilated common bile duct (18 mm) as well as intrahepatic ductal dilation (double duct sign?).  [facility] has been consulted.  Review of Systems   Unable to obtain review of systems other than what is listed above in history of present illness. Patient is lethargic.  Past Medical History  Past Medical History  Diagnosis Date  . Hypertension   . Asthma   . GERD (gastroesophageal reflux disease)   . Chronic pain   . Schizophrenia   . Alcohol abuse   . Seizures   . Pancreatitis   . Depression   . CAD (coronary artery disease)     Past Surgical History  Procedure Laterality Date  . Left heart catheterization with coronary angiogram  N/A 07/06/2013    Procedure: LEFT HEART CATHETERIZATION WITH CORONARY ANGIOGRAM;  Surgeon: Clent Demark, MD;  Location: [facility] CATH LAB;  Service: Cardiovascular;  Laterality: N/A;  . Percutaneous coronary stent intervention (pci-s)  07/06/2013    Procedure: PERCUTANEOUS CORONARY STENT INTERVENTION (PCI-S);  Surgeon: Clent Demark, MD;  Location: [facility] CATH LAB;  Service: Cardiovascular;;      Social History History  Substance Use Topics  . Smoking status: Current Every Day Smoker -- 0.00 packs/day    Types: Cigarettes  . Smokeless tobacco: Not on file  . Alcohol Use: Yes     Comment: daily heavy drinker  Patient reports he was at home with his brother Ron. He states he has had no alcohol in 10 days.  Family History Family History  Problem Relation Age of Onset  . Malignant hyperthermia Mother   . Cirrhosis Father   . Alcohol abuse Father     Prior to Admission medications   Medication Sig Start Date  End Date Taking? Authorizing Provider  aspirin 81 MG chewable tablet Chew 1 tablet (81 mg total) by mouth daily. 11/23/13   Kristen N Vu, DO  atorvastatin (LIPITOR) 80 MG tablet Take 1 tablet (80 mg total) by mouth daily. Patient taking differently: Take 80 mg by mouth at bedtime.  02/13/14   Nicole Pisciotta, PA-C  benztropine (COGENTIN) 0.5 MG tablet Take 0.5 mg by mouth 2 (two) times daily.    Historical Provider, MD  divalproex (DEPAKOTE) 500 MG DR tablet Take 1 tablet (500 mg total) by mouth 2 (two) times daily. 02/13/14   Nicole Pisciotta, PA-C  famotidine (PEPCID) 20 MG tablet Take 1 tablet (20 mg total) by mouth 2 (two) times daily. 02/13/14   Nicole Pisciotta, PA-C  feeding supplement, ENSURE ENLIVE, (ENSURE ENLIVE) LIQD Take 237 mLs by mouth 2 (two) times daily between meals. 06/11/14   Eugenie Filler, MD  folic acid (FOLVITE) 1 MG tablet Take 1 tablet (1 mg total) by mouth daily. 06/11/14   Eugenie Filler, MD  haloperidol decanoate (HALDOL DECANOATE) 100 MG/ML injection Inject 1  mL into the muscle every 28 (twenty-eight) days. 05/24/14   Historical Provider, MD  hydrALAZINE (APRESOLINE) 10 MG tablet Take 1 tablet (10 mg total) by mouth 2 (two) times daily. 02/13/14   Nicole Pisciotta, PA-C  levETIRAcetam (KEPPRA) 250 MG tablet Take 5 tablets (1,250 mg total) by mouth 2 (two) times daily. 06/18/14   Everlene Balls, MD  LORazepam (ATIVAN) 0.5 MG tablet Take 0.5 mg by mouth at bedtime as needed for anxiety.    Historical Provider, MD  metoprolol tartrate (LOPRESSOR) 25 MG tablet Take 0.5 tablets (12.5 mg total) by mouth 2 (two) times daily. Patient taking differently: Take 25 mg by mouth daily.  02/13/14   Nicole Pisciotta, PA-C  Multiple Vitamin (MULTIVITAMIN WITH MINERALS) TABS tablet Take 1 tablet by mouth daily. 06/11/14   Eugenie Filler, MD  QUEtiapine (SEROQUEL) 50 MG tablet Take 1 tablet (50 mg total) by mouth at bedtime. 02/13/14   Nicole Pisciotta, PA-C  thiamine 100 MG tablet Take 1 tablet (100 mg total) by mouth daily. 06/11/14   Eugenie Filler, MD  traZODone (DESYREL) 100 MG tablet Take 1 tablet by mouth at bedtime as needed for sleep.  05/13/14   Historical Provider, MD  vitamin B-12 500 MCG tablet Take 1 tablet (500 mcg total) by mouth daily. 07/02/14   Orson Eva, MD    Allergies  Allergen Reactions  . Hctz [Hydrochlorothiazide] Other (See Comments)    Dizzy spells  . Hydroxyzine Hives  . Sulfonamide Derivatives Hives  . Cetirizine & Related Other (See Comments)    Brothers were not aware of this allergy    Physical Exam  Vitals  Blood pressure 108/59, pulse 50, temperature 98.3 F (36.8 C), temperature source Oral, resp. rate 16, SpO2 99 %.   General: Ill-appearing well-developed male lying in bed in lethargic. He appears drawn  Psych:  Lethargic, awake but drifts off to sleep quickly. Tangential.  Neuro:   Lethargic appears to be able to move all extremities.  ENT: Eyes are anicteric, mucous membranes are dry.  Neck:  Supple, No lymphadenopathy  appreciated  Respiratory:  Symmetrical chest wall movement, Good air movement bilaterally, CTAB.  Cardiac:  RRR, No Murmurs, no LE edema noted, no JVD.    Abdomen:  Nontender to palpation. Positive bowel sounds. Soft. No frank masses.  Skin:  No Cyanosis, Normal Skin Turgor, No Skin Rash or Bruise., No  jaundice.  Extremities:  Able to move all 4. 5/5 strength in each,  no effusions.  Data Review  CBC  Recent Labs Lab 07/15/14 1849 07/16/14 1748 07/18/14 0930  WBC 6.9 7.3 8.0  HGB 13.5 13.2 13.3  HCT 40.2 39.1 38.8*  PLT 309 331 317  MCV 93.9 93.8 93.3  MCH 31.5 31.7 32.0  MCHC 33.6 33.8 34.3  RDW 15.1 14.9 14.9    Chemistries   Recent Labs Lab 07/15/14 1849 07/16/14 1748 07/18/14 0930  NA 139 135 136  K 4.2 4.7 4.0  CL 99* 98* 99*  CO2 31 29 25   GLUCOSE 103* 89 153*  BUN 9 11 10   CREATININE 0.93 0.83 0.74  CALCIUM 9.4 9.1 9.2  AST 98* 182* 241*  ALT 181* 325* 326*  ALKPHOS 249* 354* 453*  BILITOT 0.4 0.7 0.6     Urinalysis    Component Value Date/Time   COLORURINE YELLOW 07/18/2014 1041   APPEARANCEUR CLOUDY* 07/18/2014 1041   LABSPEC 1.009 07/18/2014 1041   PHURINE 6.5 07/18/2014 1041   GLUCOSEU NEGATIVE 07/18/2014 1041   HGBUR NEGATIVE 07/18/2014 1041   BILIRUBINUR NEGATIVE 07/18/2014 [place] 07/18/2014 1041   PROTEINUR NEGATIVE 07/18/2014 1041   UROBILINOGEN 1.0 07/18/2014 1041   NITRITE NEGATIVE 07/18/2014 1041   LEUKOCYTESUR NEGATIVE 07/18/2014 1041    Imaging results:   Dg Chest 2 View  07/18/2014   CLINICAL DATA:  Mid chest pain  EXAM: CHEST - 2 VIEW  COMPARISON:  None.  FINDINGS: The heart size and mediastinal contours are within normal limits. Both lungs are clear. The visualized skeletal structures are unremarkable.  IMPRESSION: No active disease.   Electronically Signed   By: Inez Catalina M.D.   On: 07/18/2014 12:26   US Abdomen Complete  07/18/2014   CLINICAL DATA:  Chronic right upper quadrant abdominal pain.   EXAM: ULTRASOUND ABDOMEN COMPLETE  COMPARISON:  CT scan of January 12, 2014.  FINDINGS: Gallbladder: No wall thickening visualized. No sonographic Murphy sign noted. Non shadowing non mobile echogenic focus measuring 5 mm is noted near gallbladder fundus concerning for adherent stone or polyp. Mild amount of sludge is noted.  Common bile duct: Diameter: 18 mm proximally concerning for distal obstruction. No definite calculus is noted in the visualized portion of the duct.  Liver: Mild intrahepatic ductal dilatation is noted. No other focal abnormality is noted. Echogenicity of parenchyma is within normal limits.  IVC: No abnormality visualized.  Pancreas: Visualized portion appears normal. Pancreatic tail not visualized due to overlying bowel gas.  Spleen: Size and appearance within normal limits.  Right Kidney: Length: 10.4 cm. Echogenicity within normal limits. No mass or hydronephrosis visualized.  Left Kidney: Length: 12.4 cm. Echogenicity within normal limits. No mass or hydronephrosis visualized.  Abdominal aorta: No aneurysm visualized.  Other findings: None.  IMPRESSION: Small adherent gallstone or polyp is noted toward the fundus of the gallbladder. Minimal amount of sludge is noted within the gallbladder lumen. No gallbladder wall thickening, pericholecystic fluid or sonographic Murphy's sign is noted.  Severely dilated common bile duct is noted as well as mild intrahepatic biliary ductal dilatation. This is concerning for distal common bile duct obstruction and further evaluation with MRCP is recommended.   Electronically Signed   By: Marijo Conception, M.D.   On: 07/18/2014 12:48    My personal review of EKG: Normal sinus rhythm. Mild ST elevation in leads 2, 3, and V5. QT is not prolonged.   Assessment &  Plan  Principal Problem:   Choledocholithiasis with obstruction Active Problems:   HYPERCHOLESTEROLEMIA   Anoxic brain injury   Tobacco abuse   Schizophrenia   Seizure disorder   Common  bile duct obstruction   Lice infestation   Dehydration  Dilated CBD with biliary obstruction Without frank cholecystitis. Possible Choledocholithiasis vs tumor.  CBD is 18 mm on ultrasound. Gastroenterology has been consulted. Anticipate ERCP vs MRCP. Will give IV hydration. Nothing by mouth except sips with meds until seen by gastroenterology. At the appropriate time patient will need cholecystectomy.  Altered mental status Patient has history of anoxic brain injury and schizophrenia. He has also received 2 doses of fentanyl in the ER. I am uncertain of his baseline. He does not appear to have an acute infection. His ammonia level is normal. His serum alcohol is less than 5.  Ongoing tobacco abuse. Nicotine patch. Nicotine cessation counseling.  Lice Infestation Permethrin ordered.  Seizure Disorder Appears stable. Continue Keppra and Depakote  Schizophrenia Appears stable. Receives Haldol decanoate monthly. We'll continue Seroquel, trazodone, Cogentin.  Hyperlipidemia Hold statin secondary to elevated LFTs  Coronary artery disease Patient does not describe chest pain. Continue 81 mg aspirin.  Recent history of alcohol abuse Patient reports no alcohol in 10 days. Serum alcohol level is less than 5 Will place him on CIWA protocol. Continue thiamine and folate.  Abnormal EKG Denies Chest pain.    Will cycle troponins Recheck EKG in am.   Continue 81 mg aspirin   Consultants Called:  [facility].  Family Communication:   Attempted to call patient's mother. Unfortunately phone number was disconnected.  Code Status:  Full code  Condition:  Guarded  Potential Disposition: To home in approximately 72 hours pending workup results.  Time spent in minutes : [place],  PA-C on 07/18/2014 at 2:58 PM Between 7am to 7pm - Pager - [phone] After 7pm go to [email or web address] - password [email or web address] And look for the night coverage person covering me after  hours  [facility]

## 2014-07-19 DIAGNOSIS — E860 Dehydration: Secondary | ICD-10-CM

## 2014-07-19 NOTE — Progress Notes (Signed)
Shift event note:  Notified that Chase Blackwell left AMA d/t being upset regarding NPO status. He informed the RN that he was leaving so he could take the bus home. RN reported at the time he was leaving he was ambulatory w/o assistance and VSS.   Chase [place], NP-C [facility] Pager [phone]

## 2014-07-19 NOTE — Discharge Summary (Signed)
Physician Discharge Summary  Chase Blackwell ZYY:[id number] DOB: 17-Aug-1965 DOA: 07/18/2014  [facility]P: [facility]  Admit date: 07/18/2014 Discharge date: 07/19/2014  Time spent: 15 minutes  Recommendations for Outpatient Follow-up:  1. Patient left AMA at 1 am in the morning. 2. He has a severely dilated CBD and needs and MRCP with continued GI work up.   Discharge Diagnoses:  Principal Problem:   Choledocholithiasis with obstruction Active Problems:   HYPERCHOLESTEROLEMIA   Anoxic brain injury   Tobacco abuse   Schizophrenia   Seizure disorder   Common bile duct obstruction   Lice infestation   Dehydration   Elevated LFTs   Alcohol-induced acute pancreatitis   Discharge Condition: presumed stable.  Patient not examined by this practitioner at the time of discharge.  There were no vitals filed for this visit.  History of present illness:  Chase Blackwell is a 49 y.o. male, with an anoxic brain injury, hypertension, chronic pain, coronary artery disease, seizures, history of alcohol abuse. Who was recently admitted in the beginning of June with seizure and at the end of June with pancreatitis. He presented 07/18/2014 with abdominal pain and is found to have elevated LFTs due to an obstructed and severely dilated CBD.  Hospital Course:  He was admitted.  [facility] consulted and recommended and MRCP.  MRCP was temporarily on hold as the patient needed to be de-loused.  Patient left AMA due to NPO status.     Consultations:   Gastroenterology  Discharge Exam: Filed Vitals:   07/18/14 1706  BP:   Pulse:   Temp: 98.3 F (36.8 C)  Resp: 18    Patient left AMA and was not examined at the time of discharge.  RN reported at the time he was leaving he was ambulatory w/o assistance and VSS.   Discharge Instructions    Discharge Medication List as of 07/18/2014  8:21 PM    CONTINUE these medications which have NOT CHANGED   Details  aspirin 81 MG chewable tablet  Chew 1 tablet (81 mg total) by mouth daily., Starting 11/23/2013, Until Discontinued, Print    atorvastatin (LIPITOR) 80 MG tablet Take 1 tablet (80 mg total) by mouth daily., Starting 02/13/2014, Until Discontinued, Print    benztropine (COGENTIN) 0.5 MG tablet Take 0.5 mg by mouth 2 (two) times daily., Until Discontinued, Historical Med    divalproex (DEPAKOTE) 500 MG DR tablet Take 1 tablet (500 mg total) by mouth 2 (two) times daily., Starting 02/13/2014, Until Discontinued, Print    famotidine (PEPCID) 20 MG tablet Take 1 tablet (20 mg total) by mouth 2 (two) times daily., Starting 02/13/2014, Until Discontinued, Print    feeding supplement, ENSURE ENLIVE, (ENSURE ENLIVE) LIQD Take 237 mLs by mouth 2 (two) times daily between meals., Starting 06/11/2014, Until Discontinued, No Print    folic acid (FOLVITE) 1 MG tablet Take 1 tablet (1 mg total) by mouth daily., Starting 06/11/2014, Until Discontinued, No Print    haloperidol decanoate (HALDOL DECANOATE) 100 MG/ML injection Inject 1 mL into the muscle every 28 (twenty-eight) days., Starting 05/24/2014, Until Discontinued, Historical Med    hydrALAZINE (APRESOLINE) 10 MG tablet Take 1 tablet (10 mg total) by mouth 2 (two) times daily., Starting 02/13/2014, Until Discontinued, Print    levETIRAcetam (KEPPRA) 250 MG tablet Take 5 tablets (1,250 mg total) by mouth 2 (two) times daily., Starting 06/18/2014, Until Discontinued, Print    LORazepam (ATIVAN) 0.5 MG tablet Take 0.5 mg by mouth at bedtime as needed for  anxiety., Until Discontinued, Historical Med    metoprolol tartrate (LOPRESSOR) 25 MG tablet Take 0.5 tablets (12.5 mg total) by mouth 2 (two) times daily., Starting 02/13/2014, Until Discontinued, Print    Multiple Vitamin (MULTIVITAMIN WITH MINERALS) TABS tablet Take 1 tablet by mouth daily., Starting 06/11/2014, Until Discontinued, OTC    QUEtiapine (SEROQUEL) 50 MG tablet Take 1 tablet (50 mg total) by mouth at bedtime., Starting 02/13/2014,  Until Discontinued, Print    thiamine 100 MG tablet Take 1 tablet (100 mg total) by mouth daily., Starting 06/11/2014, Until Discontinued, OTC    traZODone (DESYREL) 100 MG tablet Take 1 tablet by mouth at bedtime as needed for sleep. , Starting 05/13/2014, Until Discontinued, Historical Med    vitamin B-12 500 MCG tablet Take 1 tablet (500 mcg total) by mouth daily., Starting 07/02/2014, Until Discontinued, Normal       Allergies  Allergen Reactions  . Hctz [Hydrochlorothiazide] Other (See Comments)    Dizzy spells  . Hydroxyzine Hives  . Sulfonamide Derivatives Hives  . Cetirizine & Related Other (See Comments)    Brothers were not aware of this allergy   Follow-up Information    Follow up with [facility].   Specialty:  Internal Medicine   Contact information:   [address] [place] [address] [phone]       Follow up with [facility].   Specialty:  Internal Medicine   Contact information:   [address] [place] [address] [phone]        The results of significant diagnostics from this hospitalization (including imaging, microbiology, ancillary and laboratory) are listed below for reference.    Significant Diagnostic Studies: Dg Chest 2 View  07/18/2014   CLINICAL DATA:  Mid chest pain  EXAM: CHEST - 2 VIEW  COMPARISON:  None.  FINDINGS: The heart size and mediastinal contours are within normal limits. Both lungs are clear. The visualized skeletal structures are unremarkable.  IMPRESSION: No active disease.   Electronically Signed   By: Inez Catalina M.D.   On: 07/18/2014 12:26   US Abdomen Complete  07/18/2014   CLINICAL DATA:  Chronic right upper quadrant abdominal pain.  EXAM: ULTRASOUND ABDOMEN COMPLETE  COMPARISON:  CT scan of January 12, 2014.  FINDINGS: Gallbladder: No wall thickening visualized. No sonographic Murphy sign noted. Non shadowing non mobile echogenic focus measuring 5 mm is noted near gallbladder fundus concerning for  adherent stone or polyp. Mild amount of sludge is noted.  Common bile duct: Diameter: 18 mm proximally concerning for distal obstruction. No definite calculus is noted in the visualized portion of the duct.  Liver: Mild intrahepatic ductal dilatation is noted. No other focal abnormality is noted. Echogenicity of parenchyma is within normal limits.  IVC: No abnormality visualized.  Pancreas: Visualized portion appears normal. Pancreatic tail not visualized due to overlying bowel gas.  Spleen: Size and appearance within normal limits.  Right Kidney: Length: 10.4 cm. Echogenicity within normal limits. No mass or hydronephrosis visualized.  Left Kidney: Length: 12.4 cm. Echogenicity within normal limits. No mass or hydronephrosis visualized.  Abdominal aorta: No aneurysm visualized.  Other findings: None.  IMPRESSION: Small adherent gallstone or polyp is noted toward the fundus of the gallbladder. Minimal amount of sludge is noted within the gallbladder lumen. No gallbladder wall thickening, pericholecystic fluid or sonographic Murphy's sign is noted.  Severely dilated common bile duct is noted as well as mild intrahepatic biliary ductal dilatation. This is concerning for distal common bile  duct obstruction and further evaluation with MRCP is recommended.   Electronically Signed   By: Marijo Conception, M.D.   On: 07/18/2014 12:48    Labs: Basic Metabolic Panel:  Recent Labs Lab 07/15/14 1849 07/16/14 1748 07/18/14 0930  NA 139 135 136  K 4.2 4.7 4.0  CL 99* 98* 99*  CO2 31 29 25   GLUCOSE 103* 89 153*  BUN 9 11 10   CREATININE 0.93 0.83 0.74  CALCIUM 9.4 9.1 9.2   Liver Function Tests:  Recent Labs Lab 07/15/14 1849 07/16/14 1748 07/18/14 0930  AST 98* 182* 241*  ALT 181* 325* 326*  ALKPHOS 249* 354* 453*  BILITOT 0.4 0.7 0.6  PROT 7.3 7.2 6.8  ALBUMIN 3.9 3.8 3.8    Recent Labs Lab 07/15/14 1849 07/16/14 1748 07/18/14 0930  LIPASE 213* 196* 387*    Recent Labs Lab 07/18/14 1110   AMMONIA 52*   CBC:  Recent Labs Lab 07/15/14 1849 07/16/14 1748 07/18/14 0930  WBC 6.9 7.3 8.0  HGB 13.5 13.2 13.3  HCT 40.2 39.1 38.8*  MCV 93.9 93.8 93.3  PLT 309 331 317   Cardiac Enzymes:  Recent Labs Lab 07/18/14 1726  TROPONINI 0.06*   BNP: BNP (last 3 results)  Recent Labs  05/01/14 2153 06/06/14 2206  BNP 21.0 28.5     Signed:  Karen Kitchens [phone]  [facility] 07/19/2014, 8:55 AM

## 2014-07-22 ENCOUNTER — Inpatient Hospital Stay (HOSPITAL_COMMUNITY)
Admission: EM | Admit: 2014-07-22 | Discharge: 2014-07-25 | DRG: 439 | Discharge status: Against Medical Advice | Payer: Medicare Other | Attending: Internal Medicine | Admitting: Internal Medicine

## 2014-07-22 ENCOUNTER — Encounter (HOSPITAL_COMMUNITY): Payer: Self-pay | Admitting: *Deleted

## 2014-07-22 DIAGNOSIS — Z888 Allergy status to other drugs, medicaments and biological substances status: Secondary | ICD-10-CM | POA: Diagnosis not present

## 2014-07-22 DIAGNOSIS — K219 Gastro-esophageal reflux disease without esophagitis: Secondary | ICD-10-CM | POA: Diagnosis present

## 2014-07-22 DIAGNOSIS — Z7982 Long term (current) use of aspirin: Secondary | ICD-10-CM

## 2014-07-22 DIAGNOSIS — F1010 Alcohol abuse, uncomplicated: Secondary | ICD-10-CM | POA: Diagnosis not present

## 2014-07-22 DIAGNOSIS — F209 Schizophrenia, unspecified: Secondary | ICD-10-CM | POA: Diagnosis present

## 2014-07-22 DIAGNOSIS — K8021 Calculus of gallbladder without cholecystitis with obstruction: Secondary | ICD-10-CM | POA: Diagnosis present

## 2014-07-22 DIAGNOSIS — F17210 Nicotine dependence, cigarettes, uncomplicated: Secondary | ICD-10-CM | POA: Diagnosis present

## 2014-07-22 DIAGNOSIS — B852 Pediculosis, unspecified: Secondary | ICD-10-CM | POA: Diagnosis present

## 2014-07-22 DIAGNOSIS — I2510 Atherosclerotic heart disease of native coronary artery without angina pectoris: Secondary | ICD-10-CM | POA: Diagnosis present

## 2014-07-22 DIAGNOSIS — K831 Obstruction of bile duct: Secondary | ICD-10-CM | POA: Diagnosis not present

## 2014-07-22 DIAGNOSIS — G40909 Epilepsy, unspecified, not intractable, without status epilepticus: Secondary | ICD-10-CM | POA: Diagnosis present

## 2014-07-22 DIAGNOSIS — Z881 Allergy status to other antibiotic agents status: Secondary | ICD-10-CM | POA: Diagnosis not present

## 2014-07-22 DIAGNOSIS — Z79899 Other long term (current) drug therapy: Secondary | ICD-10-CM

## 2014-07-22 DIAGNOSIS — K851 Biliary acute pancreatitis: Secondary | ICD-10-CM | POA: Diagnosis not present

## 2014-07-22 DIAGNOSIS — B888 Other specified infestations: Secondary | ICD-10-CM

## 2014-07-22 DIAGNOSIS — R001 Bradycardia, unspecified: Secondary | ICD-10-CM | POA: Diagnosis present

## 2014-07-22 DIAGNOSIS — J45909 Unspecified asthma, uncomplicated: Secondary | ICD-10-CM | POA: Diagnosis present

## 2014-07-22 DIAGNOSIS — I5030 Unspecified diastolic (congestive) heart failure: Secondary | ICD-10-CM | POA: Diagnosis present

## 2014-07-22 DIAGNOSIS — K852 Alcohol induced acute pancreatitis: Secondary | ICD-10-CM | POA: Diagnosis not present

## 2014-07-22 DIAGNOSIS — K858 Other acute pancreatitis: Secondary | ICD-10-CM | POA: Diagnosis not present

## 2014-07-22 DIAGNOSIS — G8929 Other chronic pain: Secondary | ICD-10-CM | POA: Diagnosis present

## 2014-07-22 DIAGNOSIS — R740 Nonspecific elevation of levels of transaminase and lactic acid dehydrogenase [LDH]: Secondary | ICD-10-CM | POA: Diagnosis not present

## 2014-07-22 DIAGNOSIS — Z882 Allergy status to sulfonamides status: Secondary | ICD-10-CM | POA: Diagnosis not present

## 2014-07-22 DIAGNOSIS — K859 Acute pancreatitis without necrosis or infection, unspecified: Secondary | ICD-10-CM

## 2014-07-22 DIAGNOSIS — Z87820 Personal history of traumatic brain injury: Secondary | ICD-10-CM

## 2014-07-22 DIAGNOSIS — K7010 Alcoholic hepatitis without ascites: Secondary | ICD-10-CM | POA: Diagnosis present

## 2014-07-22 DIAGNOSIS — F329 Major depressive disorder, single episode, unspecified: Secondary | ICD-10-CM | POA: Diagnosis present

## 2014-07-22 DIAGNOSIS — Z811 Family history of alcohol abuse and dependence: Secondary | ICD-10-CM

## 2014-07-22 DIAGNOSIS — I10 Essential (primary) hypertension: Secondary | ICD-10-CM | POA: Diagnosis present

## 2014-07-22 DIAGNOSIS — Z955 Presence of coronary angioplasty implant and graft: Secondary | ICD-10-CM | POA: Diagnosis not present

## 2014-07-22 LAB — URINALYSIS, ROUTINE W REFLEX MICROSCOPIC
Bilirubin Urine: NEGATIVE
GLUCOSE, UA: NEGATIVE mg/dL
Hgb urine dipstick: NEGATIVE
Ketones, ur: NEGATIVE mg/dL
Leukocytes, UA: NEGATIVE
Nitrite: NEGATIVE
PH: 7 (ref 5.0–8.0)
PROTEIN: NEGATIVE mg/dL
Specific Gravity, Urine: 1.003 — ABNORMAL LOW (ref 1.005–1.030)
UROBILINOGEN UA: 0.2 mg/dL (ref 0.0–1.0)

## 2014-07-22 LAB — COMPREHENSIVE METABOLIC PANEL
ALBUMIN: 3.8 g/dL (ref 3.5–5.0)
ALK PHOS: 332 U/L — AB (ref 38–126)
ALT: 136 U/L — ABNORMAL HIGH (ref 17–63)
AST: 47 U/L — AB (ref 15–41)
Anion gap: 11 (ref 5–15)
BILIRUBIN TOTAL: 0.6 mg/dL (ref 0.3–1.2)
BUN: 8 mg/dL (ref 6–20)
CO2: 26 mmol/L (ref 22–32)
CREATININE: 0.69 mg/dL (ref 0.61–1.24)
Calcium: 9.3 mg/dL (ref 8.9–10.3)
Chloride: 99 mmol/L — ABNORMAL LOW (ref 101–111)
GFR calc Af Amer: >60 mL/min (ref >60)
GFR calc non Af Amer: >60 mL/min (ref >60)
Glucose, Bld: 124 mg/dL — ABNORMAL HIGH (ref 65–99)
Potassium: 4.3 mmol/L (ref 3.5–5.1)
SODIUM: 136 mmol/L (ref 135–145)
Total Protein: 6.8 g/dL (ref 6.5–8.1)

## 2014-07-22 LAB — ETHANOL: Alcohol, Ethyl (B): <5 mg/dL (ref <5)

## 2014-07-22 LAB — RAPID URINE DRUG SCREEN, HOSP PERFORMED
AMPHETAMINES: NOT DETECTED
BARBITURATES: NOT DETECTED
Benzodiazepines: NOT DETECTED
Cocaine: NOT DETECTED
OPIATES: NOT DETECTED
Tetrahydrocannabinol: NOT DETECTED

## 2014-07-22 LAB — CBC
HEMATOCRIT: 36.7 % — AB (ref 39.0–52.0)
Hemoglobin: 12.6 g/dL — ABNORMAL LOW (ref 13.0–17.0)
MCH: 32 pg (ref 26.0–34.0)
MCHC: 34.3 g/dL (ref 30.0–36.0)
MCV: 93.1 fL (ref 78.0–100.0)
Platelets: 326 10*3/uL (ref 150–400)
RBC: 3.94 MIL/uL — ABNORMAL LOW (ref 4.22–5.81)
RDW: 14.3 % (ref 11.5–15.5)
WBC: 7.6 10*3/uL (ref 4.0–10.5)

## 2014-07-22 LAB — LIPASE, BLOOD: Lipase: 337 U/L — ABNORMAL HIGH (ref 22–51)

## 2014-07-22 MED ORDER — FOLIC ACID 1 MG PO TABS
1.0000 mg | ORAL_TABLET | Freq: Every day | ORAL | Status: DC
  Administered 2014-07-22 – 2014-07-25 (×3): 1 mg via ORAL
  Filled 2014-07-22 (×4): qty 1

## 2014-07-22 MED ORDER — ONDANSETRON HCL 4 MG/2ML IJ SOLN: 4.0000 mg | Freq: Four times a day (QID) | INTRAMUSCULAR | Status: DC | PRN

## 2014-07-22 MED ORDER — THIAMINE HCL 100 MG/ML IJ SOLN
100.0000 mg | Freq: Every day | INTRAMUSCULAR | Status: DC
  Administered 2014-07-24: 100 mg via INTRAVENOUS
  Filled 2014-07-22 (×3): qty 1

## 2014-07-22 MED ORDER — VITAMIN B-1 100 MG PO TABS
100.0000 mg | ORAL_TABLET | Freq: Every day | ORAL | Status: DC
  Administered 2014-07-22 – 2014-07-25 (×3): 100 mg via ORAL
  Filled 2014-07-22 (×4): qty 1

## 2014-07-22 MED ORDER — LEVETIRACETAM 750 MG PO TABS
1250.0000 mg | ORAL_TABLET | Freq: Two times a day (BID) | ORAL | Status: DC
Start: 2014-07-22 — End: 2014-07-24
  Administered 2014-07-22 – 2014-07-24 (×4): 1250 mg via ORAL
  Filled 2014-07-22 (×6): qty 1

## 2014-07-22 MED ORDER — DIVALPROEX SODIUM 500 MG PO DR TAB
500.0000 mg | DELAYED_RELEASE_TABLET | Freq: Two times a day (BID) | ORAL | Status: DC
  Administered 2014-07-22 – 2014-07-25 (×6): 500 mg via ORAL
  Filled 2014-07-22 (×7): qty 1

## 2014-07-22 MED ORDER — HYDROMORPHONE HCL 1 MG/ML IJ SOLN
1.0000 mg | INTRAMUSCULAR | Status: DC | PRN
  Administered 2014-07-22: 1 mg via INTRAVENOUS
  Filled 2014-07-22: qty 1

## 2014-07-22 MED ORDER — BENZTROPINE MESYLATE 0.5 MG PO TABS
0.5000 mg | ORAL_TABLET | Freq: Two times a day (BID) | ORAL | Status: DC
  Administered 2014-07-22 – 2014-07-25 (×6): 0.5 mg via ORAL
  Filled 2014-07-22 (×10): qty 1

## 2014-07-22 MED ORDER — MORPHINE SULFATE 2 MG/ML IJ SOLN
1.0000 mg | INTRAMUSCULAR | Status: DC | PRN
  Administered 2014-07-22 – 2014-07-24 (×9): 1 mg via INTRAVENOUS
  Filled 2014-07-22 (×9): qty 1

## 2014-07-22 MED ORDER — TRAZODONE HCL 100 MG PO TABS
100.0000 mg | ORAL_TABLET | Freq: Every evening | ORAL | Status: DC | PRN
  Administered 2014-07-24: 100 mg via ORAL
  Filled 2014-07-22: qty 1

## 2014-07-22 MED ORDER — THIAMINE HCL 100 MG/ML IJ SOLN: 100.0000 mg | Freq: Every day | INTRAMUSCULAR | Status: DC

## 2014-07-22 MED ORDER — SODIUM CHLORIDE 0.9 % IJ SOLN
3.0000 mL | Freq: Two times a day (BID) | INTRAMUSCULAR | Status: DC
  Administered 2014-07-24 – 2014-07-25 (×3): 3 mL via INTRAVENOUS

## 2014-07-22 MED ORDER — FOLIC ACID 5 MG/ML IJ SOLN
1.0000 mg | Freq: Every day | INTRAMUSCULAR | Status: DC
  Administered 2014-07-24: 1 mg via INTRAVENOUS
  Filled 2014-07-22 (×4): qty 0.2

## 2014-07-22 MED ORDER — LORAZEPAM 2 MG/ML IJ SOLN: 1.0000 mg | Freq: Four times a day (QID) | INTRAMUSCULAR | Status: DC | PRN

## 2014-07-22 MED ORDER — SODIUM CHLORIDE 0.9 % IV BOLUS (SEPSIS)
1000.0000 mL | Freq: Once | INTRAVENOUS | Status: AC
  Administered 2014-07-22: 1000 mL via INTRAVENOUS

## 2014-07-22 MED ORDER — ADULT MULTIVITAMIN W/MINERALS CH
1.0000 | ORAL_TABLET | Freq: Every day | ORAL | Status: DC
  Administered 2014-07-22 – 2014-07-25 (×3): 1 via ORAL
  Filled 2014-07-22 (×4): qty 1

## 2014-07-22 MED ORDER — FOLIC ACID 1 MG PO TABS: 1.0000 mg | ORAL_TABLET | Freq: Every day | ORAL | Status: DC

## 2014-07-22 MED ORDER — FOLIC ACID 5 MG/ML IJ SOLN
1.0000 mg | Freq: Every day | INTRAMUSCULAR | Status: DC
  Filled 2014-07-22: qty 0.2

## 2014-07-22 MED ORDER — ONDANSETRON HCL 4 MG PO TABS: 4.0000 mg | ORAL_TABLET | Freq: Four times a day (QID) | ORAL | Status: DC | PRN

## 2014-07-22 MED ORDER — SODIUM CHLORIDE 0.9 % IV SOLN
INTRAVENOUS | Status: DC
  Administered 2014-07-22 – 2014-07-23 (×3): via INTRAVENOUS
  Administered 2014-07-24: 1000 mL via INTRAVENOUS
  Administered 2014-07-24 – 2014-07-25 (×2): via INTRAVENOUS

## 2014-07-22 MED ORDER — LORAZEPAM 1 MG PO TABS: 1.0000 mg | ORAL_TABLET | Freq: Four times a day (QID) | ORAL | Status: DC | PRN

## 2014-07-22 MED ORDER — ENOXAPARIN SODIUM 40 MG/0.4ML ~~LOC~~ SOLN
40.0000 mg | SUBCUTANEOUS | Status: DC
  Administered 2014-07-22 – 2014-07-24 (×3): 40 mg via SUBCUTANEOUS
  Filled 2014-07-22 (×5): qty 0.4

## 2014-07-22 NOTE — Progress Notes (Deleted)
0800 and 1200 blood sugars not taken by [employer] [facility] due to patient being in dialysis during this time, blood sugar taken when patient was received back on the floor from dialysis by [employer] [facility].

## 2014-07-22 NOTE — Progress Notes (Addendum)
NURSING PROGRESS NOTE  Chase Blackwell [id number] Admission Data: 07/22/2014 6:49 PM Attending Provider: Theodis Blaze, MD VAP:[facility] Code Status: Full  Allergies:  Hctz; Hydroxyzine; Sulfonamide derivatives; and Cetirizine & related Past Medical History:   has a past medical history of Hypertension; Asthma; GERD (gastroesophageal reflux disease); Chronic pain; Schizophrenia; Alcohol abuse; Seizures; Pancreatitis; Depression; and CAD (coronary artery disease). Past Surgical History:   has past surgical history that includes left heart catheterization with coronary angiogram (N/A, 07/06/2013) and percutaneous coronary stent intervention (pci-s) (07/06/2013). Social History:   reports that he has been smoking Cigarettes.  He has been smoking about 0.00 packs per day. He does not have any smokeless tobacco history on file. He reports that he drinks alcohol. He reports that he uses illicit drugs (Marijuana).  Chase Blackwell is a 49 y.o. male patient admitted from ED:   Last Documented Vital Signs: Blood pressure 101/56, pulse 48, temperature 97.3 F (36.3 C), temperature source Oral, resp. rate 20, SpO2 97 %.  Cardiac Monitoring:  5W#11 Telemetry Box, running SB.  IV Fluids:  IV in place, occlusive dsg intact without redness, IV cath antecubital right, condition patent and no redness. NS IV fluids.  Skin: WDL  Patient/Family orientated to room. Information packet given to patient/family. Admission inpatient armband information verified with patient/family to include name and date of birth and placed on patient arm. Side rails up x 2, fall assessment and education completed with patient/family. Patient/family able to verbalize understanding of risk associated with falls and verbalized understanding to call for assistance before getting out of bed. Call light within reach. Patient/family able to voice and demonstrate understanding of unit orientation instructions.    Will continue to  evaluate and treat per MD orders.   Hendricks Limes RN, BS, BSN

## 2014-07-22 NOTE — Progress Notes (Signed)
Radiology called and requested pt need to be on NPO except for meds with sip of water. NPO signed placed on door, pt informed.

## 2014-07-22 NOTE — Progress Notes (Signed)
MD Doyle Askew paged of patient's admission to [facility] floor.

## 2014-07-22 NOTE — Progress Notes (Signed)
Report ED RN Abigail Butts.
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
## 2014-07-22 NOTE — Progress Notes (Signed)
Patient ID: Chase Blackwell, male   DOB: 08/01/1965, 49 y.o.   MRN: [id number]  [facility] History and Physical  Chase Blackwell XBJ:[id number] DOB: [birth year] DOA: 07/22/2014  Referring physician: ED physician, Dr. Joya Blackwell  PCP: Chase Blackwell   Chief Complaint: abd pain with N/V  HPI:  Pt is 49 yo male with known history of alcohol use, seizures, anoxic brain injury, recently hospitalized for abd pain (studies notable for dilated bile duct and MRCP recommended but pt left AMA), now presenting with same concern. Please note pt is somewhat poor historian and slow to respond. Pt reports upper quadrants abd pain, mostly left side, constant and 7/10 in severity, occasionally radiating to the back area, associated with nausea and non bloody vomiting, no specific alleviating or aggravating factors, similar to the pain he had on last admission when he left AMA. Pt states he is willing to stay here now as he is in more pain. He denies fevers, chills, chest pain. He reports he had bed bugs.   In ED, pt noted to be stable with VS notable for HR in 40's but otherwise unremarkable, blood work notable for lipase > 300, LFT's elevated but much better compared to the values on 7/14 (see below). [facility] asked to admit for further evaluation.   Assessment and Plan: Acute pancreatitis - admit to telemetry bed due to bradycardia - provide supportive care with IVF, analgesia, antiemetics as needed - also keep NPO for now - repeat lipase level in AM  Transaminitis - secondary to alcohol induced hepatitis - LFT's much better compared to 7/14 values - repeat LFT's in AM  CBD obstruction - recent abd [place] 7/14 with small adherent gallstone or polyp, severely dilated CBD, ? distal CBD obstruction and further  - MRCP recommended but since pt had bed bugs, MRCP put on hold - will repeat abd [place] for re evaluation   Alcohol abuse - discuss cessation once more medically stable - place on CIWA  Seizures  -  continue home medical regimen   Bradycardia - pt asymptomatic - stop home regimen metoprolol   DVT prophylaxis - Lovenox SQ  Radiological Exams on Admission: No results found.   Code Status: Full Family Communication: Pt at bedside Disposition Plan: Admit for further evaluation    Chase Blackwell Chase Blackwell [phone]   Review of Systems:  Constitutional: Negative for fever, chills. Negative for diaphoresis.  HENT: Negative for hearing loss, ear pain, nosebleeds, congestion, sore throat, neck pain, tinnitus and ear discharge.   Eyes: Negative for blurred vision, double vision, photophobia, pain, discharge and redness.  Respiratory: Negative for cough, hemoptysis, sputum production, shortness of breath, wheezing and stridor.   Cardiovascular: Negative for chest pain, palpitations, orthopnea, claudication and leg swelling.  Gastrointestinal: Per HPI  Genitourinary: Negative for dysuria, urgency, frequency, hematuria and flank pain.  Musculoskeletal: Negative for myalgias, back pain, joint pain and falls.  Skin: Negative for itching and rash.  Neurological: Negative for dizziness and weakness Endo/Heme/Allergies: Negative for environmental allergies and polydipsia. Does not bruise/bleed easily.  Psychiatric/Behavioral: Negative for suicidal ideas. The patient is not nervous/anxious.      Past Medical History  Diagnosis Date  . Hypertension   . Asthma   . GERD (gastroesophageal reflux disease)   . Chronic pain   . Schizophrenia   . Alcohol abuse   . Seizures   . Pancreatitis   . Depression   . CAD (coronary artery disease)     Past Surgical History  Procedure  Laterality Date  . Left heart catheterization with coronary angiogram N/A 07/06/2013    Procedure: LEFT HEART CATHETERIZATION WITH CORONARY ANGIOGRAM;  Surgeon: Chase Demark, MD;  Location: [facility] CATH LAB;  Service: Cardiovascular;  Laterality: N/A;  . Percutaneous coronary stent intervention (pci-s)  07/06/2013    Procedure:  PERCUTANEOUS CORONARY STENT INTERVENTION (PCI-S);  Surgeon: Chase Demark, MD;  Location: [facility] CATH LAB;  Service: Cardiovascular;;    Social History:  reports that he has been smoking Cigarettes.  He has been smoking about 0.00 packs per day. He does not have any smokeless tobacco history on file. He reports that he drinks alcohol. He reports that he uses illicit drugs (Marijuana).  Allergies  Allergen Reactions  . Hctz [Hydrochlorothiazide] Other (See Comments)    Dizzy spells  . Hydroxyzine Hives  . Sulfonamide Derivatives Hives  . Cetirizine & Related Other (See Comments)    Brothers were not aware of this allergy    Family History  Problem Relation Age of Onset  . Malignant hyperthermia Mother   . Cirrhosis Father   . Alcohol abuse Father     Prior to Admission medications   Medication Sig Start Date End Date Taking? Authorizing Provider  aspirin 81 MG chewable tablet Chew 1 tablet (81 mg total) by mouth daily. 11/23/13   Chase N Hinch, DO  atorvastatin (LIPITOR) 80 MG tablet Take 1 tablet (80 mg total) by mouth daily. Patient taking differently: Take 80 mg by mouth at bedtime.  02/13/14   Chase Pisciotta, Blackwell-C  benztropine (COGENTIN) 0.5 MG tablet Take 0.5 mg by mouth 2 (two) times daily.    Historical Provider, MD  divalproex (DEPAKOTE) 500 MG DR tablet Take 1 tablet (500 mg total) by mouth 2 (two) times daily. 02/13/14   Chase Pisciotta, Blackwell-C  famotidine (PEPCID) 20 MG tablet Take 1 tablet (20 mg total) by mouth 2 (two) times daily. 02/13/14   Chase Pisciotta, Blackwell-C  feeding supplement, ENSURE ENLIVE, (ENSURE ENLIVE) LIQD Take 237 mLs by mouth 2 (two) times daily between meals. 06/11/14   Chase Filler, MD  folic acid (FOLVITE) 1 MG tablet Take 1 tablet (1 mg total) by mouth daily. 06/11/14   Chase Filler, MD  haloperidol decanoate (HALDOL DECANOATE) 100 MG/ML injection Inject 1 mL into the muscle every 28 (twenty-eight) days. 05/24/14   Historical Provider, MD   hydrALAZINE (APRESOLINE) 10 MG tablet Take 1 tablet (10 mg total) by mouth 2 (two) times daily. 02/13/14   Chase Pisciotta, Blackwell-C  levETIRAcetam (KEPPRA) 250 MG tablet Take 5 tablets (1,250 mg total) by mouth 2 (two) times daily. 06/18/14   Everlene Balls, MD  LORazepam (ATIVAN) 0.5 MG tablet Take 0.5 mg by mouth at bedtime as needed for anxiety.    Historical Provider, MD  metoprolol tartrate (LOPRESSOR) 25 MG tablet Take 0.5 tablets (12.5 mg total) by mouth 2 (two) times daily. Patient taking differently: Take 25 mg by mouth daily.  02/13/14   Chase Pisciotta, Blackwell-C  Multiple Vitamin (MULTIVITAMIN WITH MINERALS) TABS tablet Take 1 tablet by mouth daily. 06/11/14   Chase Filler, MD  QUEtiapine (SEROQUEL) 50 MG tablet Take 1 tablet (50 mg total) by mouth at bedtime. 02/13/14   Chase Pisciotta, Blackwell-C  thiamine 100 MG tablet Take 1 tablet (100 mg total) by mouth daily. 06/11/14   Chase Filler, MD  traZODone (DESYREL) 100 MG tablet Take 1 tablet by mouth at bedtime as needed for sleep.  05/13/14   Historical Provider,  MD  vitamin B-12 500 MCG tablet Take 1 tablet (500 mcg total) by mouth daily. 07/02/14   Orson Eva, MD    Physical Exam: Filed Vitals:   07/22/14 1503 07/22/14 1615 07/22/14 1645 07/22/14 1745  BP: 139/84   104/48  Pulse: 74 48 49 50  Temp: 97.4 F (36.3 C)     TempSrc: Oral     Resp: 16     SpO2: 98% 100% 98% 98%    Physical Exam  Constitutional: Appears sleepy, very slow to respond, NAD HENT: Normocephalic. External right and left ear normal. Dry MM Eyes: Conjunctivae and EOM are normal. PERRLA, no scleral icterus.  Neck: Normal ROM. Neck supple. No JVD. No tracheal deviation. No thyromegaly.  CVS: Regular rhythm, bradycardic, S1/S2 +, no murmurs, no gallops, no carotid bruit.  Pulmonary: Effort and breath sounds normal, no stridor, rhonchi, wheezes, rales.  Abdominal: Soft. BS +,  no distension, tenderness in upper abd quadrants, no rebound or guarding.  Musculoskeletal:  Normal range of motion.  Lymphadenopathy: No lymphadenopathy noted, cervical, inguinal. Neuro:  No cranial nerve deficit. Skin: Skin is warm and dry. No rash noted. Psychiatric: difficult to assess as pt is sleepy and difficult to answer to queastion   Labs on Admission:  Basic Metabolic Panel:  Recent Labs Lab 07/15/14 1849 07/16/14 1748 07/18/14 0930 07/22/14 1517  NA 139 135 136 136  K 4.2 4.7 4.0 4.3  CL 99* 98* 99* 99*  CO2 31 29 25 26   GLUCOSE 103* 89 153* 124*  BUN 9 11 10 8   CREATININE 0.93 0.83 0.74 0.69  CALCIUM 9.4 9.1 9.2 9.3   Liver Function Tests:  Recent Labs Lab 07/15/14 1849 07/16/14 1748 07/18/14 0930 07/22/14 1517  AST 98* 182* 241* 47*  ALT 181* 325* 326* 136*  ALKPHOS 249* 354* 453* 332*  BILITOT 0.4 0.7 0.6 0.6  PROT 7.3 7.2 6.8 6.8  ALBUMIN 3.9 3.8 3.8 3.8    Recent Labs Lab 07/15/14 1849 07/16/14 1748 07/18/14 0930 07/22/14 1517  LIPASE 213* 196* 387* 337*    Recent Labs Lab 07/18/14 1110  AMMONIA 52*   CBC:  Recent Labs Lab 07/15/14 1849 07/16/14 1748 07/18/14 0930 07/22/14 1517  WBC 6.9 7.3 8.0 7.6  HGB 13.5 13.2 13.3 12.6*  HCT 40.2 39.1 38.8* 36.7*  MCV 93.9 93.8 93.3 93.1  PLT 309 331 317 326   Cardiac Enzymes:  Recent Labs Lab 07/18/14 1726  TROPONINI 0.06*   EKG: pending   If 7PM-7AM, please contact night-coverage [email or web address] Password [facility] 07/22/2014, 6:35 PM

## 2014-07-22 NOTE — ED Notes (Signed)
Called pt to go back to a room with no answer x1

## 2014-07-23 DIAGNOSIS — K831 Obstruction of bile duct: Secondary | ICD-10-CM

## 2014-07-23 DIAGNOSIS — K852 Alcohol induced acute pancreatitis: Principal | ICD-10-CM

## 2014-07-23 DIAGNOSIS — K858 Other acute pancreatitis: Secondary | ICD-10-CM

## 2014-07-23 LAB — COMPREHENSIVE METABOLIC PANEL
ALT: 100 U/L — AB (ref 17–63)
ANION GAP: 6 (ref 5–15)
AST: 30 U/L (ref 15–41)
Albumin: 3.1 g/dL — ABNORMAL LOW (ref 3.5–5.0)
Alkaline Phosphatase: 280 U/L — ABNORMAL HIGH (ref 38–126)
BUN: 6 mg/dL (ref 6–20)
CO2: 27 mmol/L (ref 22–32)
Calcium: 8.6 mg/dL — ABNORMAL LOW (ref 8.9–10.3)
Chloride: 106 mmol/L (ref 101–111)
Creatinine, Ser: 0.56 mg/dL — ABNORMAL LOW (ref 0.61–1.24)
GFR calc non Af Amer: >60 mL/min (ref >60)
Glucose, Bld: 82 mg/dL (ref 65–99)
Potassium: 4.1 mmol/L (ref 3.5–5.1)
Sodium: 139 mmol/L (ref 135–145)
Total Bilirubin: 0.6 mg/dL (ref 0.3–1.2)
Total Protein: 6 g/dL — ABNORMAL LOW (ref 6.5–8.1)

## 2014-07-23 LAB — LIPASE, BLOOD: Lipase: 181 U/L — ABNORMAL HIGH (ref 22–51)

## 2014-07-23 LAB — CBC
HEMATOCRIT: 34 % — AB (ref 39.0–52.0)
HEMOGLOBIN: 11.3 g/dL — AB (ref 13.0–17.0)
MCH: 31.7 pg (ref 26.0–34.0)
MCHC: 33.2 g/dL (ref 30.0–36.0)
MCV: 95.2 fL (ref 78.0–100.0)
Platelets: 274 10*3/uL (ref 150–400)
RBC: 3.57 MIL/uL — ABNORMAL LOW (ref 4.22–5.81)
RDW: 14.7 % (ref 11.5–15.5)
WBC: 6.2 10*3/uL (ref 4.0–10.5)

## 2014-07-23 MED ORDER — PERMETHRIN 1 % EX LOTN
TOPICAL_LOTION | Freq: Once | CUTANEOUS | Status: AC
  Administered 2014-07-23: 1 via TOPICAL
  Filled 2014-07-23: qty 59
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
## 2014-07-23 NOTE — Care Management Note (Signed)
Case Management Note  Patient Details  Name: ABSHIR PAOLINI MRN: [id number] Date of Birth: 06-26-1965  Subjective/Objective:       Patient llives with brother, pta indep,  NCM will cont to follow for dc needs.             Action/Plan:   Expected Discharge Date:                  Expected Discharge Plan:  Home/Self Care  In-House Referral:     Discharge planning Services  CM Consult  Post Acute Care Choice:    Choice offered to:     DME Arranged:    DME Agency:     HH Arranged:    HH Agency:     Status of Service:  In process, will continue to follow  Medicare Important Message Given:    Date Medicare IM Given:    Medicare IM give by:    Date Additional Medicare IM Given:    Additional Medicare Important Message give by:     If discussed at [place] of Stay Meetings, dates discussed:    Additional Comments:  Zenon Mayo, RN 07/23/2014, 12:18 PM

## 2014-07-23 NOTE — Progress Notes (Signed)
Pt's HR been running in the 40's. RN assessed pt, asymptomatic, resting comfortably. Schorr NP paged and notified.

## 2014-07-23 NOTE — Consult Note (Signed)
                                                                           [facility] Consult: 10:34 AM 07/23/2014  LOS: 1 day    Referring Provider:  Dr Hernandez.  Primary Care Physician:  [facility] Primary Gastroenterologist:  Unassigned.    Reason for Consultation:  Pancreatitis.    HPI: Chase Blackwell is a 49 y.o. male.  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  Seizures. Bed bugs vs scabies 07/2014. Diastolic heart failure, schizophrenia.  Most of his many hospitalizations end with pt leaving AMA.  IR placed G tube 07/2013.  01/2014 acute ETOH pancreatitis. CT with acute pancreatitis, mild PD dilation. Ultrasound with GB polyp vs sludge ball, 8 mm CBD without stone, but incompletely visualized.  Left AMA. Admissions for seizure and ruled in for non Q MI early 06/2014.   Pancreatitis, attributed to ETOH, 07/02/2014, left AMA.  Admission 7/14 -7/15 with pancreatitis, Lipase 387, AST/ALT 241/326, alk phos 453, t bili 0.6.   Ultrasound with 18mm CBD, mild intrahepatic ductal dilation, GB sludge and stone vs polyp, but no wall thickening, no CBD stone. Dr Pyrtle recommended MRCP but it was delayed as the patient needed to be de-loused and in meantime he left AMA.   Now returns with same abd pain. Lipase 337, today 181. AST/ALT 30/100, alk phos 280, t bili 0.6. MRCP planned one bed bugs/?scabies dealt with. Pt denies n/v.     Past Medical History  Diagnosis Date  . Hypertension   . Asthma   . GERD (gastroesophageal reflux disease)   . Chronic pain   . Schizophrenia   . Alcohol abuse   . Seizures   . Pancreatitis   . Depression   . CAD (coronary artery disease)     Past Surgical History  Procedure Laterality Date  . Left heart catheterization with coronary angiogram N/A 07/06/2013    Procedure: LEFT  HEART CATHETERIZATION WITH CORONARY ANGIOGRAM;  Surgeon: Mohan N Harwani, MD;  Location: [facility] CATH [facility];  Service: Cardiovascular;  Laterality: N/A;  . Percutaneous coronary stent intervention (pci-s)  07/06/2013    Procedure: PERCUTANEOUS CORONARY STENT INTERVENTION (PCI-S);  Surgeon: Mohan N Harwani, MD;  Location: [facility] CATH [facility];  Service: Cardiovascular;;    Prior to Admission medications   Medication Sig Start Date End Date Taking? Authorizing Provider  atorvastatin (LIPITOR) 80 MG tablet Take 1 tablet (80 mg total) by mouth daily. Patient taking differently: Take 80 mg by mouth at bedtime.  02/13/14  Yes Nicole Pisciotta, PA-C  benztropine (COGENTIN) 0.5 MG tablet Take 0.5 mg by mouth 2 (two) times daily.   Yes Historical Provider, MD  clopidogrel (PLAVIX) 75 MG tablet Take 75 mg by mouth daily. 06/11/14  Yes Historical Provider, MD  divalproex (DEPAKOTE) 500 MG DR tablet Take 1 tablet (500 mg total) by mouth 2 (two) times daily. 02/13/14  Yes Nicole Pisciotta, PA-C  furosemide (LASIX) 40 MG tablet Take 40 mg by mouth daily. 07/18/14  Yes Historical Provider, MD  hydrALAZINE (APRESOLINE) 10 MG tablet Take 1 tablet (10 mg total) by mouth 2 (two) times daily. 02/13/14    Yes Nicole Pisciotta, PA-C  levETIRAcetam (KEPPRA) 1000 MG tablet Take 1,000 mg by mouth 2 (two) times daily.   Yes Historical Provider, MD  lisinopril (PRINIVIL,ZESTRIL) 2.5 MG tablet Take 2.5 mg by mouth daily.   Yes Historical Provider, MD  LORazepam (ATIVAN) 0.5 MG tablet Take 0.5 mg by mouth at bedtime as needed for anxiety.   Yes Historical Provider, MD  meloxicam (MOBIC) 15 MG tablet Take 15 mg by mouth daily.   Yes Historical Provider, MD  metoprolol tartrate (LOPRESSOR) 25 MG tablet Take 0.5 tablets (12.5 mg total) by mouth 2 (two) times daily. Patient taking differently: Take 25 mg by mouth daily.  02/13/14  Yes Nicole Pisciotta, PA-C  oxyCODONE (OXY IR/ROXICODONE) 5 MG immediate release tablet Take 5 mg by mouth daily as needed.  07/15/14  Yes Historical Provider, MD  QUEtiapine (SEROQUEL) 50 MG tablet Take 1 tablet (50 mg total) by mouth at bedtime. 02/13/14  Yes Nicole Pisciotta, PA-C  vitamin B-12 500 MCG tablet Take 1 tablet (500 mcg total) by mouth daily. 07/02/14  Yes Orson Eva, MD  aspirin 81 MG chewable tablet Chew 1 tablet (81 mg total) by mouth daily. 11/23/13   Kristen N Shaikh, DO  famotidine (PEPCID) 20 MG tablet Take 1 tablet (20 mg total) by mouth 2 (two) times daily. Patient not taking: Reported on 07/22/2014 02/13/14   Elmyra Ricks Pisciotta, PA-C  feeding supplement, ENSURE ENLIVE, (ENSURE ENLIVE) LIQD Take 237 mLs by mouth 2 (two) times daily between meals. 06/11/14   Eugenie Filler, MD  folic acid (FOLVITE) 1 MG tablet Take 1 tablet (1 mg total) by mouth daily. 06/11/14   Eugenie Filler, MD  haloperidol decanoate (HALDOL DECANOATE) 100 MG/ML injection Inject 1 mL into the muscle every 28 (twenty-eight) days. 05/24/14   Historical Provider, MD  levETIRAcetam (KEPPRA) 250 MG tablet Take 5 tablets (1,250 mg total) by mouth 2 (two) times daily. Patient not taking: Reported on 07/22/2014 06/18/14   Everlene Balls, MD  Multiple Vitamin (MULTIVITAMIN WITH MINERALS) TABS tablet Take 1 tablet by mouth daily. 06/11/14   Eugenie Filler, MD  thiamine 100 MG tablet Take 1 tablet (100 mg total) by mouth daily. 06/11/14   Eugenie Filler, MD  traZODone (DESYREL) 100 MG tablet Take 1 tablet by mouth at bedtime as needed for sleep.  05/13/14   Historical Provider, MD    Scheduled Meds: . benztropine  0.5 mg Oral BID  . divalproex  500 mg Oral BID  . enoxaparin (LOVENOX) injection  40 mg Subcutaneous Q24H  . folic acid  1 mg Oral Daily   Or  . folic acid  1 mg Intravenous Daily  . levETIRAcetam  1,250 mg Oral BID  . multivitamin with minerals  1 tablet Oral Daily  . sodium chloride  3 mL Intravenous Q12H  . thiamine  100 mg Oral Daily   Or  . thiamine  100 mg Intravenous Daily   Infusions: . sodium chloride 100 mL/hr at  07/23/14 0536   PRN Meds: LORazepam **OR** LORazepam, morphine injection, ondansetron **OR** ondansetron (ZOFRAN) IV, traZODone   Allergies as of 07/22/2014 - Review Complete 07/22/2014  Allergen Reaction Noted  . Hctz [hydrochlorothiazide] Other (See Comments) 02/01/2013  . Hydroxyzine Hives 05/09/2011  . Sulfonamide derivatives Hives   . Cetirizine & related Other (See Comments) 05/09/2011    Family History  Problem Relation Age of Onset  . Malignant hyperthermia Mother   . Cirrhosis Father   . Alcohol abuse Father  History   Social History  . Marital Status: Single    Spouse Name: N/A  . Number of Children: N/A  . Years of Education: N/A   Occupational History  . Not on file.   Social History Main Topics  . Smoking status: Current Every Day Smoker -- 0.00 packs/day    Types: Cigarettes  . Smokeless tobacco: Not on file  . Alcohol Use: Yes     Comment: daily heavy drinker  . Drug Use: Yes    Special: Marijuana  . Sexual Activity: Not on file   Other Topics Concern  . Not on file   Social History Narrative    REVIEW OF SYSTEMS: Constitutional:  5 # weight loss in 8 days.  ENT:  No nose bleeds Pulm:  Denies SOB, cough CV:  No palpitations, no LE edema.  GU:  No hematuria, no frequency GI:  No dyaphagia, no nausea.  Heme:  No bleeding/bruising.    Transfusions:  Denies.  Neuro:  No headaches, no peripheral tingling or numbness Derm:  No itching, no rash or sores.  Endocrine:  No sweats or chills.  No polyuria or dysuria Immunization:  Not queried Travel:  None beyond local counties in last few months.    PHYSICAL EXAM: Vital signs in last 24 hours: Filed Vitals:   07/23/14 0610  BP: 90/59  Pulse: 42  Temp: 97.7 F (36.5 C)  Resp: 16   Wt Readings from Last 3 Encounters:  07/23/14 139 lb 15.9 oz (63.5 kg)  07/16/14 146 lb 11.2 oz (66.543 kg)  07/15/14 144 lb (65.318 kg)    General: thin, looks unhealthy but not toxic, vacant stare, not  very verbal Head:  No swelling or asymmetry  Eyes:  No icterus Ears:  Not HOH  Nose:  No discharge or congestion Mouth:  Clear and moist.  Neck:  No mass or JVD Lungs:  Clear bil.  No cough or dyspnea Heart: RRR.  No mrg.  S1/s2 audible Abdomen:  Soft, NT, ND.  No mass.  No HSM.   Rectal: deferred   Musc/Skeltl: no joint swelling or deformity Extremities:  No CCE  Neurologic:  Only oriented to himself and to cone, not to year.   Skin:  Small punctate petechiae on legs Tattoos:  On upper right deltoid region.  Nodes:  No cervical adenopathy   Psych:  Bland affect c/w "negative" schizophrenia sxs. Laconic.  Not currently agitated.  Moves all 4 limbs.   Intake/Output from previous day: 07/18 0701 - 07/19 0700 In: 0  Out: 900 [Urine:900] Intake/Output this shift: Total I/O In: 1345 [P.O.:120; I.V.:1225] Out: 0   LAB RESULTS:  Recent Labs  07/22/14 1517 07/23/14 0530  WBC 7.6 6.2  HGB 12.6* 11.3*  HCT 36.7* 34.0*  PLT 326 274   BMET Lab Results  Component Value Date   NA 139 07/23/2014   NA 136 07/22/2014   NA 136 07/18/2014   K 4.1 07/23/2014   K 4.3 07/22/2014   K 4.0 07/18/2014   CL 106 07/23/2014   CL 99* 07/22/2014   CL 99* 07/18/2014   CO2 27 07/23/2014   CO2 26 07/22/2014   CO2 25 07/18/2014   GLUCOSE 82 07/23/2014   GLUCOSE 124* 07/22/2014   GLUCOSE 153* 07/18/2014   BUN 6 07/23/2014   BUN 8 07/22/2014   BUN 10 07/18/2014   CREATININE 0.56* 07/23/2014   CREATININE 0.69 07/22/2014   CREATININE 0.74 07/18/2014   CALCIUM 8.6* 07/23/2014     CALCIUM 9.3 07/22/2014   CALCIUM 9.2 07/18/2014   LFT  Recent Labs  07/22/14 1517 07/23/14 0530  PROT 6.8 6.0*  ALBUMIN 3.8 3.1*  AST 47* 30  ALT 136* 100*  ALKPHOS 332* 280*  BILITOT 0.6 0.6   PT/INR Lab Results  Component Value Date   INR 1.13 06/18/2014   INR 1.14 06/10/2014   INR 1.09 06/06/2014   Hepatitis Panel No results for input(s): HEPBSAG, HCVAB, HEPAIGM, HEPBIGM in the last 72  hours. C-Diff No components found for: CDIFF Lipase     Component Value Date/Time   LIPASE 181* 07/23/2014 0530    Drugs of Abuse     Component Value Date/Time   LABOPIA NONE DETECTED 07/22/2014 1717   LABOPIA NEGATIVE 07/06/2013 2352   COCAINSCRNUR NONE DETECTED 07/22/2014 1717   COCAINSCRNUR NEGATIVE 07/06/2013 2352   LABBENZ NONE DETECTED 07/22/2014 1717   LABBENZ POSITIVE* 07/06/2013 2352   AMPHETMU NONE DETECTED 07/22/2014 1717   AMPHETMU NEGATIVE 07/06/2013 2352   THCU NONE DETECTED 07/22/2014 1717   LABBARB NONE DETECTED 07/22/2014 1717     RADIOLOGY STUDIES: No results found.  ENDOSCOPIC STUDIES: none  IMPRESSION:   *  Pancreatitis with CBD obstruction/dilation.  Rule out choledocholithiasis, rule out CBD stricture from his recurrent episodes of pancreatitis. Marland Kitchen   *  Schizophrenia.  *  Alcoholism.      PLAN:     *  MRCP once deloused, I ordered this for tomorrow. Does not need another ultrasound, so I cancelled this  *  Keep him well sedated and in pain meds if needed to try to keep him inpt long enough to work up his issues.    Azucena Freed  07/23/2014, 10:34 AM Pager: [phone] Attending MD note:   I have taken a history, examined the patient, and reviewed the chart. I agree with the Advanced Practitioner's impression and recommendations. He seems to be in lot of pain but no acute changes on imaging studies. Dilated CBD 19 mm likely due to chronic pancreatitis/possible stricture. Needs MRCP to see if stenting indicated.  [facility] Pager # [phone]

## 2014-07-24 ENCOUNTER — Inpatient Hospital Stay (HOSPITAL_COMMUNITY): Payer: Medicare Other

## 2014-07-24 DIAGNOSIS — R001 Bradycardia, unspecified: Secondary | ICD-10-CM

## 2014-07-24 DIAGNOSIS — K859 Acute pancreatitis, unspecified: Secondary | ICD-10-CM

## 2014-07-24 DIAGNOSIS — K831 Obstruction of bile duct: Secondary | ICD-10-CM | POA: Insufficient documentation

## 2014-07-24 DIAGNOSIS — R740 Nonspecific elevation of levels of transaminase and lactic acid dehydrogenase [LDH]: Secondary | ICD-10-CM

## 2014-07-24 LAB — COMPREHENSIVE METABOLIC PANEL
ALBUMIN: 2.9 g/dL — AB (ref 3.5–5.0)
ALT: 74 U/L — AB (ref 17–63)
AST: 22 U/L (ref 15–41)
Alkaline Phosphatase: 272 U/L — ABNORMAL HIGH (ref 38–126)
Anion gap: 8 (ref 5–15)
BUN: <5 mg/dL — ABNORMAL LOW (ref 6–20)
CALCIUM: 8.4 mg/dL — AB (ref 8.9–10.3)
CO2: 26 mmol/L (ref 22–32)
CREATININE: 0.6 mg/dL — AB (ref 0.61–1.24)
Chloride: 106 mmol/L (ref 101–111)
Glucose, Bld: 85 mg/dL (ref 65–99)
POTASSIUM: 3.9 mmol/L (ref 3.5–5.1)
Sodium: 140 mmol/L (ref 135–145)
Total Bilirubin: 0.7 mg/dL (ref 0.3–1.2)
Total Protein: 5.6 g/dL — ABNORMAL LOW (ref 6.5–8.1)

## 2014-07-24 LAB — CBC
HEMATOCRIT: 34 % — AB (ref 39.0–52.0)
Hemoglobin: 11.2 g/dL — ABNORMAL LOW (ref 13.0–17.0)
MCH: 31.6 pg (ref 26.0–34.0)
MCHC: 32.9 g/dL (ref 30.0–36.0)
MCV: 96 fL (ref 78.0–100.0)
Platelets: 253 10*3/uL (ref 150–400)
RBC: 3.54 MIL/uL — ABNORMAL LOW (ref 4.22–5.81)
RDW: 14.7 % (ref 11.5–15.5)
WBC: 5.3 10*3/uL (ref 4.0–10.5)

## 2014-07-24 LAB — SURGICAL PCR SCREEN
MRSA, PCR: NEGATIVE
Staphylococcus aureus: POSITIVE — AB

## 2014-07-24 MED ORDER — MORPHINE SULFATE 2 MG/ML IJ SOLN
2.0000 mg | INTRAMUSCULAR | Status: DC | PRN
  Administered 2014-07-24 – 2014-07-25 (×4): 2 mg via INTRAVENOUS
  Filled 2014-07-24 (×4): qty 1

## 2014-07-24 MED ORDER — MORPHINE SULFATE 2 MG/ML IJ SOLN
2.0000 mg | INTRAMUSCULAR | Status: AC | PRN
  Administered 2014-07-24 (×3): 2 mg via INTRAVENOUS
  Filled 2014-07-24 (×4): qty 1

## 2014-07-24 MED ORDER — LEVETIRACETAM 500 MG PO TABS
1000.0000 mg | ORAL_TABLET | Freq: Two times a day (BID) | ORAL | Status: DC
  Administered 2014-07-24 – 2014-07-25 (×2): 1000 mg via ORAL
  Filled 2014-07-24 (×3): qty 2

## 2014-07-24 MED ORDER — MORPHINE SULFATE 2 MG/ML IJ SOLN
2.0000 mg | Freq: Once | INTRAMUSCULAR | Status: AC
  Administered 2014-07-24: 2 mg via INTRAVENOUS

## 2014-07-24 MED ORDER — GADOBENATE DIMEGLUMINE 529 MG/ML IV SOLN
15.0000 mL | Freq: Once | INTRAVENOUS | Status: AC | PRN
  Administered 2014-07-24: 15 mL via INTRAVENOUS

## 2014-07-24 NOTE — Consult Note (Signed)
   [facility] CM Inpatient Consult   07/24/2014  Kreed Kauffman Warr [birth year] [id number] Referral received. Patient evaluated for [facility] services. Patient is not eligible for [facility] Care Management services because unfortunately after double checking, this patient is not noted in the Medicare delegation for services with [facility]. Contacted patient's primary MD office, [facility] and found that the patient has just recently re-started follow up with the clinic.  For questions, please contact:  Natividad Brood, RN BSN [facility] Liaison  [phone] business mobile phone

## 2014-07-24 NOTE — Progress Notes (Signed)
Daily Rounding Note  07/24/2014, 4:32 PM  LOS: 2 days   SUBJECTIVE:       Upper abdominal pain 10 out of 10  OBJECTIVE:  Pt appears comfortable ,laying in bed. Abdomen mildly tender, no rebound, normal bowl sounds.           Vital signs in last 24 hours:    Temp:  [97.7 F (36.5 C)-98.4 F (36.9 C)] 97.7 F (36.5 C) (07/20 1332) Pulse Rate:  [47-56] 56 (07/20 1332) Resp:  [16] 16 (07/20 0635) BP: (102-139)/(65-67) 139/65 mmHg (07/20 1332) SpO2:  [98 %-100 %] 100 % (07/20 1332) Weight:  [142 lb 13.7 oz (64.8 kg)] 142 lb 13.7 oz (64.8 kg) (07/20 0500) Last BM Date: 07/21/14 Filed Weights   07/22/14 2100 07/23/14 0421 07/24/14 0500  Weight: 142 lb 3.2 oz (64.5 kg) 139 lb 15.9 oz (63.5 kg) 142 lb 13.7 oz (64.8 kg)     Intake/Output from previous day: 07/19 0701 - 07/20 0700 In: 1585 [P.O.:360; I.V.:1225] Out: 2085 [Urine:2085]  Intake/Output this shift: Total I/O In: 0  Out: 600 [Urine:600]  Lab Results:  Recent Labs  07/22/14 1517 07/23/14 0530 07/24/14 0455  WBC 7.6 6.2 5.3  HGB 12.6* 11.3* 11.2*  HCT 36.7* 34.0* 34.0*  PLT 326 274 253   BMET  Recent Labs  07/22/14 1517 07/23/14 0530 07/24/14 0455  NA 136 139 140  K 4.3 4.1 3.9  CL 99* 106 106  CO2 26 27 26   GLUCOSE 124* 82 85  BUN 8 6 <5*  CREATININE 0.69 0.56* 0.60*  CALCIUM 9.3 8.6* 8.4*   LFT  Recent Labs  07/22/14 1517 07/23/14 0530 07/24/14 0455  PROT 6.8 6.0* 5.6*  ALBUMIN 3.8 3.1* 2.9*  AST 47* 30 22  ALT 136* 100* 74*  ALKPHOS 332* 280* 272*  BILITOT 0.6 0.6 0.7   PT/INR No results for input(s): LABPROT, INR in the last 72 hours. Hepatitis Panel No results for input(s): HEPBSAG, HCVAB, HEPAIGM, HEPBIGM in the last 72 hours.  Studies/Results: Mr 3d Recon At Scanner Mr Abd W/wo Cm/mrcp 07/24/2014   CLINICAL DATA:  Abdominal pain, acute pancreatitis with transaminitis, dilated CBD on ultrasound.  EXAM: MRI ABDOMEN  WITHOUT AND WITH CONTRAST (INCLUDING MRCP)  TECHNIQUE: Multiplanar multisequence MR imaging of the abdomen was performed both before and after the administration of intravenous contrast. Heavily T2-weighted images of the biliary and pancreatic ducts were obtained, and three-dimensional MRCP images were rendered by post processing.  CONTRAST:  38mL MULTIHANCE GADOBENATE DIMEGLUMINE 529 MG/ML IV SOLN  COMPARISON:  Abdominal ultrasound dated 07/18/2014. CT abdomen pelvis dated 01/12/2014.  FINDINGS: Motion degraded images.  Lower chest:  Lung bases are clear.  Hepatobiliary: Liver is within normal limits. No suspicious/enhancing hepatic lesions. No hepatic steatosis.  Mildly distended gallbladder with layering tiny gallstones/sludge (series 11/ image 33). No associated inflammatory changes.  Mild central intrahepatic ductal prominence. Common duct is dilated, measuring 20 mm, and abruptly tapers at the ampulla. No choledocholithiasis is seen.  Pancreas: Mild peripancreatic inflammatory changes with fluid/stranding along the distal pancreatic body/tail. No drainable fluid collection/pseudocyst.  Irregular dilatation of the pancreatic duct which abruptly tapers in the region of the pancreatic head (double duct sign). No definite pancreatic head mass is visualized, noting motion degradation.  Spleen: Within normal limits.  Adrenals/Urinary Tract: Adrenal glands are within normal limits.  Kidneys are within normal limits.  No hydronephrosis.  Stomach/Bowel: Stomach and visualized bowel are grossly unremarkable.  Vascular/Lymphatic: No evidence of abdominal aortic aneurysm.  No suspicious abdominal lymphadenopathy.  Other: Small volume fluid in the left mid abdomen.  Musculoskeletal: No focal osseous lesions.  IMPRESSION: Motion degraded images.  Acute pancreatitis with fluid/stranding along the distal pancreatic body/tail. No drainable fluid collection/pseudocyst.  Dilated common duct, measuring 20 mm, abruptly tapering at  the ampulla. No choledocholithiasis is seen.  Associated irregular dilatation of the pancreatic duct which abruptly tapers in the region of the pancreatic head (double duct sign). While no definite pancreatic head mass is visualized on the current study, follow-up EUS or repeat MRI abdomen with/without contrast in 3-4 weeks to exclude an occult pancreatic head neoplasm.  Cholelithiasis, without associated inflammatory changes to suggest acute cholecystitis.   Electronically Signed   By: Julian Hy M.D.   On: 07/24/2014 16:17    ASSESMENT:   * Pancreatitis with CBD obstruction/dilation. Bile ducts and PD dilated.  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    * Schizophrenia.  * Alcoholism.     PLAN   *  Will discuss case with Dr Ardis Hughs upon his return next week. Pt may need tertiary center referral for comprehensive biliary surgical and endoscopic care.   *  Continue supportive care.     Chase Blackwell  07/24/2014, 4:32 PM Pager: [phone]  Attending MD note:   I have taken a history, examined the patient, and reviewed the chart. I agree with the Advanced Practitioner's impression and recommendations. Chronic pancreatitis ,suspect  Distal CBD stricture secondary to chronic pancreatitis. Consider EUS as an outpatient if he complies with the recommendations, eventually may benefit from  A biliary stent if EUS confirms benign stricture. For now treat with bowl rest, pain control.  [facility] Pager # [phone]

## 2014-07-24 NOTE — Care Management Note (Signed)
Case Management Note  Patient Details  Name: Chase Blackwell MRN: [id number] Date of Birth: [birth year]  Subjective/Objective:      Patient for MRCP today, GI following, NCM will continue to follow for dc needs.              Action/Plan:   Expected Discharge Date:                  Expected Discharge Plan:  Home/Self Care  In-House Referral:     Discharge planning Services  CM Consult  Post Acute Care Choice:    Choice offered to:     DME Arranged:    DME Agency:     HH Arranged:    HH Agency:     Status of Service:  In process, will continue to follow  Medicare Important Message Given:    Date Medicare IM Given:    Medicare IM give by:    Date Additional Medicare IM Given:    Additional Medicare Important Message give by:     If discussed at [place] of Stay Meetings, dates discussed:    Additional Comments:  Zenon Mayo, RN 07/24/2014, 11:14 AM

## 2014-07-24 NOTE — Care Management Important Message (Signed)
Important Message  Patient Details  Name: Chase Blackwell MRN: [id number] Date of Birth: Feb 08, 1965   Medicare Important Message Given:  Yes-second notification given    [name] May 07/24/2014, 11:26 AM

## 2014-07-24 NOTE — Progress Notes (Addendum)
[facility] PROGRESS NOTE  Chase Blackwell Chase Blackwell:[id number] DOB: May 15, 1965 DOA: 07/22/2014 PCP: [facility]  Brief narrative 49 year old male with history of alcohol abuse, seizure disorder, anoxic brain injury who was recently hospitalist for abdominal pain with imaging showing dilated CBD and MRCP recommended but patient left and may return back to the hospital with similar symptoms. Patient found to have acute pancreatitis with transaminitis. [facility] following.  Assessment/Plan: Acute alcoholic pancreatitis with transaminitis and CBD dilatation Continue supportive care with pain control and antiemetics. Symptoms appear to be better. LFTs and lipase improving. MRCP ordered. GI following.  Alcohol abuse Monitor on CIWA. Continue thiamine, folate and multivitamin. Counseled on cessation. Social worker consult.  Seizure disorder Resume home dose Keppra.  Asymptomatic bradycardia Stopped metoprolol. Monitor on telemetry.  Protein calorie malnutrition Nutrition consult.  DVT prophylaxis: Subcutaneous Lovenox  Code Status: Full code Family Communication: None at bedside Disposition Plan: Currently inpatient   Consultants:  [facility]  Procedures:  MRCP (pending)  Antibiotics:  None  HPI/Subjective: Patient seen and examined. Appears sleepy. Asked for pain meds all night . No nausea or vomiting.  Objective: Filed Vitals:   07/24/14 1200  BP:   Pulse: 47  Temp:   Resp:     Intake/Output Summary (Last 24 hours) at 07/24/14 1315 Last data filed at 07/24/14 1056  Gross per 24 hour  Intake    240 ml  Output   2035 ml  Net  -1795 ml   Filed Weights   07/22/14 2100 07/23/14 0421 07/24/14 0500  Weight: 64.5 kg (142 lb 3.2 oz) 63.5 kg (139 lb 15.9 oz) 64.8 kg (142 lb 13.7 oz)    Exam:   General:  Middle aged male in no acute distress, sleepy   HEENT: No pallor, moist oral mucosa, temporal wasting, supple neck  Chest: Clear to auscultation  bilaterally, no added sounds  CVS: Normal S1 and S2, no murmurs rub or gallop  GI: Soft, nondistended, mild diffuse tenderness, bowel sounds present  musculoskeletal: Warm, no edema  CNS: sleepy but arousable,   Data Reviewed: Basic Metabolic Panel:  Recent Labs Lab 07/18/14 0930 07/22/14 1517 07/23/14 0530 07/24/14 0455  NA 136 136 139 140  K 4.0 4.3 4.1 3.9  CL 99* 99* 106 106  CO2 25 26 27 26   GLUCOSE 153* 124* 82 85  BUN 10 8 6  <5*  CREATININE 0.74 0.69 0.56* 0.60*  CALCIUM 9.2 9.3 8.6* 8.4*   Liver Function Tests:  Recent Labs Lab 07/18/14 0930 07/22/14 1517 07/23/14 0530 07/24/14 0455  AST 241* 47* 30 22  ALT 326* 136* 100* 74*  ALKPHOS 453* 332* 280* 272*  BILITOT 0.6 0.6 0.6 0.7  PROT 6.8 6.8 6.0* 5.6*  ALBUMIN 3.8 3.8 3.1* 2.9*    Recent Labs Lab 07/18/14 0930 07/22/14 1517 07/23/14 0530  LIPASE 387* 337* 181*    Recent Labs Lab 07/18/14 1110  AMMONIA 52*   CBC:  Recent Labs Lab 07/18/14 0930 07/22/14 1517 07/23/14 0530 07/24/14 0455  WBC 8.0 7.6 6.2 5.3  HGB 13.3 12.6* 11.3* 11.2*  HCT 38.8* 36.7* 34.0* 34.0*  MCV 93.3 93.1 95.2 96.0  PLT 317 326 274 253   Cardiac Enzymes:  Recent Labs Lab 07/18/14 1726  TROPONINI 0.06*   BNP (last 3 results)  Recent Labs  05/01/14 2153 06/06/14 2206  BNP 21.0 28.5    ProBNP (last 3 results) No results for input(s): PROBNP in the last 8760 hours.  CBG: No results for  input(s): GLUCAP in the last 168 hours.  Recent Results (from the past 240 hour(s))  Surgical pcr screen     Status: Abnormal   Collection Time: 07/23/14 10:20 PM  Result Value Ref Range Status   MRSA, PCR NEGATIVE NEGATIVE Final   Staphylococcus aureus POSITIVE (A) NEGATIVE Final    Comment:        The Xpert SA Assay (FDA approved for NASAL specimens in patients over 13 years of age), is one component of a comprehensive surveillance program.  Test performance has been validated by [facility] for  patients greater than or equal to 42 year old. It is not intended to diagnose infection nor to guide or monitor treatment.      Studies: No results found.  Scheduled Meds: . benztropine  0.5 mg Oral BID  . divalproex  500 mg Oral BID  . enoxaparin (LOVENOX) injection  40 mg Subcutaneous Q24H  . folic acid  1 mg Oral Daily   Or  . folic acid  1 mg Intravenous Daily  . levETIRAcetam  1,000 mg Oral BID  . multivitamin with minerals  1 tablet Oral Daily  . sodium chloride  3 mL Intravenous Q12H  . thiamine  100 mg Oral Daily   Or  . thiamine  100 mg Intravenous Daily   Continuous Infusions: . sodium chloride 1,000 mL (07/24/14 1216)      Time spent: 25 minutes    Chase Blackwell, [place]  [facility] Pager [phone] If 7PM-7AM, please contact night-coverage at [email or web address], password [facility] 07/24/2014, 1:15 PM  LOS: 2 days

## 2014-07-24 NOTE — Clinical Social Work Note (Signed)
CSW has received consults for homelessness and substance abuse on patient. The patient states that he is not homeless and lives with his brother on [address]. The patient states that he is not interested in discussing substance abuse resources. He claims that he has stopped drinking and will not drink again. CSW signing off at this time.   Liz Beach MSW, [place], [place], [phone]

## 2014-07-25 DIAGNOSIS — K851 Biliary acute pancreatitis: Secondary | ICD-10-CM

## 2014-07-25 DIAGNOSIS — F1010 Alcohol abuse, uncomplicated: Secondary | ICD-10-CM

## 2014-07-25 LAB — COMPREHENSIVE METABOLIC PANEL
ALT: 51 U/L (ref 17–63)
AST: 15 U/L (ref 15–41)
Albumin: 2.9 g/dL — ABNORMAL LOW (ref 3.5–5.0)
Alkaline Phosphatase: 212 U/L — ABNORMAL HIGH (ref 38–126)
Anion gap: 9 (ref 5–15)
BUN: <5 mg/dL — ABNORMAL LOW (ref 6–20)
CALCIUM: 8.5 mg/dL — AB (ref 8.9–10.3)
CHLORIDE: 106 mmol/L (ref 101–111)
CO2: 25 mmol/L (ref 22–32)
Creatinine, Ser: 0.55 mg/dL — ABNORMAL LOW (ref 0.61–1.24)
GFR calc non Af Amer: >60 mL/min (ref >60)
GLUCOSE: 75 mg/dL (ref 65–99)
Potassium: 3.8 mmol/L (ref 3.5–5.1)
Sodium: 140 mmol/L (ref 135–145)
TOTAL PROTEIN: 5.5 g/dL — AB (ref 6.5–8.1)
Total Bilirubin: 0.5 mg/dL (ref 0.3–1.2)

## 2014-07-25 NOTE — Progress Notes (Signed)
07/25/14 Patient went AMA. Physician aware of leaving, IV removed and telemetry removed , and he was dressed . States he had shoes coming to hospital, but none could be found.

## 2014-07-25 NOTE — Progress Notes (Signed)
Daily Rounding Note  07/25/2014, 8:43 AM  LOS: 3 days   SUBJECTIVE:       Pt pain free.  Is in process of signing out AMA  OBJECTIVE:         Vital signs in last 24 hours:    Temp:  [97.7 F (36.5 C)-98.4 F (36.9 C)] 98.4 F (36.9 C) (07/21 0448) Pulse Rate:  [47-58] 56 (07/21 0448) Resp:  [16-18] 16 (07/21 0448) BP: (105-139)/(59-71) 105/59 mmHg (07/21 0448) SpO2:  [98 %-100 %] 98 % (07/21 0448) Weight:  [140 lb (63.504 kg)] 140 lb (63.504 kg) (07/21 0448) Last BM Date: 07/21/14 Filed Weights   07/23/14 0421 07/24/14 0500 07/25/14 0448  Weight: 139 lb 15.9 oz (63.5 kg) 142 lb 13.7 oz (64.8 kg) 140 lb (63.504 kg)   General: somewhat dishevelled   Heart: RRR Chest: clear.  No dyspnea or cough Abdomen: NT, ND.  Active BS  Extremities: no CCE   Intake/Output from previous day: 07/20 0701 - 07/21 0700 In: 360 [P.O.:360] Out: 1200 [Urine:1200]  Intake/Output this shift: Total I/O In: 75 [I.V.:75] Out: -   Lab Results:  Recent Labs  07/22/14 1517 07/23/14 0530 07/24/14 0455  WBC 7.6 6.2 5.3  HGB 12.6* 11.3* 11.2*  HCT 36.7* 34.0* 34.0*  PLT 326 274 253   BMET  Recent Labs  07/22/14 1517 07/23/14 0530 07/24/14 0455  NA 136 139 140  K 4.3 4.1 3.9  CL 99* 106 106  CO2 26 27 26   GLUCOSE 124* 82 85  BUN 8 6 <5*  CREATININE 0.69 0.56* 0.60*  CALCIUM 9.3 8.6* 8.4*   LFT  Recent Labs  07/22/14 1517 07/23/14 0530 07/24/14 0455  PROT 6.8 6.0* 5.6*  ALBUMIN 3.8 3.1* 2.9*  AST 47* 30 22  ALT 136* 100* 74*  ALKPHOS 332* 280* 272*  BILITOT 0.6 0.6 0.7   PT/INR No results for input(s): LABPROT, INR in the last 72 hours. Hepatitis Panel No results for input(s): HEPBSAG, HCVAB, HEPAIGM, HEPBIGM in the last 72 hours.  Studies/Results: Mr 3d Recon At Scanner Mr Abd W/wo Cm/mrcp  07/24/2014   CLINICAL DATA:  Abdominal pain, acute pancreatitis with transaminitis, dilated CBD on ultrasound.   EXAM: MRI ABDOMEN WITHOUT AND WITH CONTRAST (INCLUDING MRCP)  TECHNIQUE: Multiplanar multisequence MR imaging of the abdomen was performed both before and after the administration of intravenous contrast. Heavily T2-weighted images of the biliary and pancreatic ducts were obtained, and three-dimensional MRCP images were rendered by post processing.  CONTRAST:  54mL MULTIHANCE GADOBENATE DIMEGLUMINE 529 MG/ML IV SOLN  COMPARISON:  Abdominal ultrasound dated 07/18/2014. CT abdomen pelvis dated 01/12/2014.  FINDINGS: Motion degraded images.  Lower chest:  Lung bases are clear.  Hepatobiliary: Liver is within normal limits. No suspicious/enhancing hepatic lesions. No hepatic steatosis.  Mildly distended gallbladder with layering tiny gallstones/sludge (series 11/ image 33). No associated inflammatory changes.  Mild central intrahepatic ductal prominence. Common duct is dilated, measuring 20 mm, and abruptly tapers at the ampulla. No choledocholithiasis is seen.  Pancreas: Mild peripancreatic inflammatory changes with fluid/stranding along the distal pancreatic body/tail. No drainable fluid collection/pseudocyst.  Irregular dilatation of the pancreatic duct which abruptly tapers in the region of the pancreatic head (double duct sign). No definite pancreatic head mass is visualized, noting motion degradation.  Spleen: Within normal limits.  Adrenals/Urinary Tract: Adrenal glands are within normal limits.  Kidneys are within normal limits.  No hydronephrosis.  Stomach/Bowel: Stomach and  visualized bowel are grossly unremarkable.  Vascular/Lymphatic: No evidence of abdominal aortic aneurysm.  No suspicious abdominal lymphadenopathy.  Other: Small volume fluid in the left mid abdomen.  Musculoskeletal: No focal osseous lesions.  IMPRESSION: Motion degraded images.  Acute pancreatitis with fluid/stranding along the distal pancreatic body/tail. No drainable fluid collection/pseudocyst.  Dilated common duct, measuring 20 mm,  abruptly tapering at the ampulla. No choledocholithiasis is seen.  Associated irregular dilatation of the pancreatic duct which abruptly tapers in the region of the pancreatic head (double duct sign). While no definite pancreatic head mass is visualized on the current study, follow-up EUS or repeat MRI abdomen with/without contrast in 3-4 weeks to exclude an occult pancreatic head neoplasm.  Cholelithiasis, without associated inflammatory changes to suggest acute cholecystitis.   Electronically Signed   By: Julian Hy M.D.   On: 07/24/2014 16:17    ASSESMENT:   * Pancreatitis with CBD obstruction/dilation. Bile ducts and PD dilated. 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  LFTs improving.  Abdominal pain resolved.   * Schizophrenia.  * Alcoholism.     PLAN   *  Will need EUS if he can comply with folllow up. Wonder if surgical biliary bypass might not be better option given stents often need to be replaced and he is not reliable.   *  Pt now signing out AMA.  Not going to be reliable for outpt follow up. May be on his nest inpt go-round an EUS can be accomplished.     Azucena Freed  07/25/2014, 8:43 AM Pager: [phone]

## 2014-07-25 NOTE — Care Management Important Message (Addendum)
Important Message  Patient Details  Name: Chase Blackwell MRN: [id number] Date of Birth: 05-Aug-1965   Medicare Important Message Given:  Yes-second notification given    Zenon Mayo, RN 07/25/2014, 9:58 AM Important Message  Patient Details  Name: Chase Blackwell MRN: [id number] Date of Birth: 1965/03/13   Medicare Important Message Given:  Yes-second notification given    Zenon Mayo, RN 07/25/2014, 9:58 AM

## 2014-07-25 NOTE — Discharge Summary (Addendum)
Physician Discharge Summary  Chase Blackwell Maxim CNO:[id number] DOB: [birth year] DOA: 07/22/2014  PCP: [facility]  Admit date: 07/22/2014 Discharge date: 07/25/2014  Time spent: 20 minutes  Recommendations for Outpatient Follow-up:  1. Patient signed out AMA. Recommend outpatient follow-up  Discharge Diagnoses:  Active Problems:   Acute pancreatitis   Common bile duct obstruction   Common bile duct (CBD) obstruction   Alcohol abuse Seizure disorder Asymmetric bradycardia Protein calorie malnutrition   Discharge Condition: Left AMA    Filed Weights   07/23/14 0421 07/24/14 0500 07/25/14 0448  Weight: 63.5 kg (139 lb 15.9 oz) 64.8 kg (142 lb 13.7 oz) 63.504 kg (140 lb)    History of present illness:  49 year old male with history of alcohol abuse, seizure disorder, anoxic brain injury who was recently hospitalist for abdominal pain with imaging showing dilated CBD and MRCP recommended but patient left and may return back to the hospital with similar symptoms. Patient found to have acute pancreatitis with transaminitis. [facility] following.  Hospital Course:  Acute alcoholic pancreatitis with transaminitis and CBD dilatation Continue supportive care with pain control and antiemetics. Symptoms  Improving. LFTs and lipase improving. MRCP showed CBD dilatation along with pancreatic duct dilatation suspicious of a benign stricture that's causing recurrent acute pancreatitis. GI recommends EUS as outpatient. However patient wished to go home early this afternoon. I discussed in detail that he was not stable enough to be discharged home at this time but patient did not wish to have further conversation and left AMA.  Patient has been admitted with recurrent pancreatitis and signing out AMA during prior hospitalizations. He appeared oriented during my exam today and did not wait to have a long conversation. If he gets readmitted again, which he likely will, he will need a psychiatric  evaluation to assess capacity.  Alcohol abuse Monitored on CIWA. No Signs of withdrawal.  Social worker was consulted  Seizure disorder Resume home dose Keppra.  Asymptomatic bradycardia Stopped metoprolol. Was stable on telemetry  Protein calorie malnutrition     Code Status: Full code    Consultants:  [facility]  Procedures:  MRCP   Antibiotics:  None    Discharge Exam: Filed Vitals:   07/25/14 0448  BP: 105/59  Pulse: 56  Temp: 98.4 F (36.9 C)  Resp: 16    General: Middle aged thin built male in no acute distress HEENT: No pallor, moist mucosa Chest: Clear to auscultation bilaterally CVS: Normal S1 and S2, no murmurs GI: Soft, nondistended, nontender, bowel sounds present Musculoskeletal musculoskeletal: Warm, no edema CNS: Alert and oriented, no tremors   Discharge Instructions    Discharge Medication List as of 07/25/2014  2:17 PM    CONTINUE these medications which have NOT CHANGED   Details  atorvastatin (LIPITOR) 80 MG tablet Take 1 tablet (80 mg total) by mouth daily., Starting 02/13/2014, Until Discontinued, Print    benztropine (COGENTIN) 0.5 MG tablet Take 0.5 mg by mouth 2 (two) times daily., Until Discontinued, Historical Med    clopidogrel (PLAVIX) 75 MG tablet Take 75 mg by mouth daily., Starting 06/11/2014, Until Discontinued, Historical Med    divalproex (DEPAKOTE) 500 MG DR tablet Take 1 tablet (500 mg total) by mouth 2 (two) times daily., Starting 02/13/2014, Until Discontinued, Print    furosemide (LASIX) 40 MG tablet Take 40 mg by mouth daily., Starting 07/18/2014, Until Discontinued, Historical Med    hydrALAZINE (APRESOLINE) 10 MG tablet Take 1 tablet (10 mg total) by mouth 2 (two) times  daily., Starting 02/13/2014, Until Discontinued, Print    !! levETIRAcetam (KEPPRA) 1000 MG tablet Take 1,000 mg by mouth 2 (two) times daily., Until Discontinued, Historical Med    lisinopril (PRINIVIL,ZESTRIL) 2.5 MG tablet Take 2.5 mg by  mouth daily., Until Discontinued, Historical Med    LORazepam (ATIVAN) 0.5 MG tablet Take 0.5 mg by mouth at bedtime as needed for anxiety., Until Discontinued, Historical Med    meloxicam (MOBIC) 15 MG tablet Take 15 mg by mouth daily., Until Discontinued, Historical Med    metoprolol tartrate (LOPRESSOR) 25 MG tablet Take 0.5 tablets (12.5 mg total) by mouth 2 (two) times daily., Starting 02/13/2014, Until Discontinued, Print    oxyCODONE (OXY IR/ROXICODONE) 5 MG immediate release tablet Take 5 mg by mouth daily as needed., Starting 07/15/2014, Until Discontinued, Historical Med    QUEtiapine (SEROQUEL) 50 MG tablet Take 1 tablet (50 mg total) by mouth at bedtime., Starting 02/13/2014, Until Discontinued, Print    vitamin B-12 500 MCG tablet Take 1 tablet (500 mcg total) by mouth daily., Starting 07/02/2014, Until Discontinued, Normal    aspirin 81 MG chewable tablet Chew 1 tablet (81 mg total) by mouth daily., Starting 11/23/2013, Until Discontinued, Print    famotidine (PEPCID) 20 MG tablet Take 1 tablet (20 mg total) by mouth 2 (two) times daily., Starting 02/13/2014, Until Discontinued, Print    feeding supplement, ENSURE ENLIVE, (ENSURE ENLIVE) LIQD Take 237 mLs by mouth 2 (two) times daily between meals., Starting 06/11/2014, Until Discontinued, No Print    folic acid (FOLVITE) 1 MG tablet Take 1 tablet (1 mg total) by mouth daily., Starting 06/11/2014, Until Discontinued, No Print    haloperidol decanoate (HALDOL DECANOATE) 100 MG/ML injection Inject 1 mL into the muscle every 28 (twenty-eight) days., Starting 05/24/2014, Until Discontinued, Historical Med    !! levETIRAcetam (KEPPRA) 250 MG tablet Take 5 tablets (1,250 mg total) by mouth 2 (two) times daily., Starting 06/18/2014, Until Discontinued, Print    Multiple Vitamin (MULTIVITAMIN WITH MINERALS) TABS tablet Take 1 tablet by mouth daily., Starting 06/11/2014, Until Discontinued, OTC    thiamine 100 MG tablet Take 1 tablet (100 mg  total) by mouth daily., Starting 06/11/2014, Until Discontinued, OTC    traZODone (DESYREL) 100 MG tablet Take 1 tablet by mouth at bedtime as needed for sleep. , Starting 05/13/2014, Until Discontinued, Historical Med     !! - Potential duplicate medications found. Please discuss with provider.     Allergies  Allergen Reactions  . Hctz [Hydrochlorothiazide] Other (See Comments)    Dizzy spells  . Hydroxyzine Hives  . Sulfonamide Derivatives Hives  . Cetirizine & Related Other (See Comments)    Brothers were not aware of this allergy      The results of significant diagnostics from this hospitalization (including imaging, microbiology, ancillary and laboratory) are listed below for reference.    Significant Diagnostic Studies: Dg Chest 2 View  07/18/2014   CLINICAL DATA:  Mid chest pain  EXAM: CHEST - 2 VIEW  COMPARISON:  None.  FINDINGS: The heart size and mediastinal contours are within normal limits. Both lungs are clear. The visualized skeletal structures are unremarkable.  IMPRESSION: No active disease.   Electronically Signed   By: Inez Catalina M.D.   On: 07/18/2014 12:26   US Abdomen Complete  07/18/2014   CLINICAL DATA:  Chronic right upper quadrant abdominal pain.  EXAM: ULTRASOUND ABDOMEN COMPLETE  COMPARISON:  CT scan of January 12, 2014.  FINDINGS: Gallbladder: No wall thickening visualized.  No sonographic Murphy sign noted. Non shadowing non mobile echogenic focus measuring 5 mm is noted near gallbladder fundus concerning for adherent stone or polyp. Mild amount of sludge is noted.  Common bile duct: Diameter: 18 mm proximally concerning for distal obstruction. No definite calculus is noted in the visualized portion of the duct.  Liver: Mild intrahepatic ductal dilatation is noted. No other focal abnormality is noted. Echogenicity of parenchyma is within normal limits.  IVC: No abnormality visualized.  Pancreas: Visualized portion appears normal. Pancreatic tail not visualized due  to overlying bowel gas.  Spleen: Size and appearance within normal limits.  Right Kidney: Length: 10.4 cm. Echogenicity within normal limits. No mass or hydronephrosis visualized.  Left Kidney: Length: 12.4 cm. Echogenicity within normal limits. No mass or hydronephrosis visualized.  Abdominal aorta: No aneurysm visualized.  Other findings: None.  IMPRESSION: Small adherent gallstone or polyp is noted toward the fundus of the gallbladder. Minimal amount of sludge is noted within the gallbladder lumen. No gallbladder wall thickening, pericholecystic fluid or sonographic Murphy's sign is noted.  Severely dilated common bile duct is noted as well as mild intrahepatic biliary ductal dilatation. This is concerning for distal common bile duct obstruction and further evaluation with MRCP is recommended.   Electronically Signed   By: Marijo Conception, M.D.   On: 07/18/2014 12:48   Mr 3d Recon At Scanner  07/24/2014   CLINICAL DATA:  Abdominal pain, acute pancreatitis with transaminitis, dilated CBD on ultrasound.  EXAM: MRI ABDOMEN WITHOUT AND WITH CONTRAST (INCLUDING MRCP)  TECHNIQUE: Multiplanar multisequence MR imaging of the abdomen was performed both before and after the administration of intravenous contrast. Heavily T2-weighted images of the biliary and pancreatic ducts were obtained, and three-dimensional MRCP images were rendered by post processing.  CONTRAST:  17mL MULTIHANCE GADOBENATE DIMEGLUMINE 529 MG/ML IV SOLN  COMPARISON:  Abdominal ultrasound dated 07/18/2014. CT abdomen pelvis dated 01/12/2014.  FINDINGS: Motion degraded images.  Lower chest:  Lung bases are clear.  Hepatobiliary: Liver is within normal limits. No suspicious/enhancing hepatic lesions. No hepatic steatosis.  Mildly distended gallbladder with layering tiny gallstones/sludge (series 11/ image 33). No associated inflammatory changes.  Mild central intrahepatic ductal prominence. Common duct is dilated, measuring 20 mm, and abruptly tapers  at the ampulla. No choledocholithiasis is seen.  Pancreas: Mild peripancreatic inflammatory changes with fluid/stranding along the distal pancreatic body/tail. No drainable fluid collection/pseudocyst.  Irregular dilatation of the pancreatic duct which abruptly tapers in the region of the pancreatic head (double duct sign). No definite pancreatic head mass is visualized, noting motion degradation.  Spleen: Within normal limits.  Adrenals/Urinary Tract: Adrenal glands are within normal limits.  Kidneys are within normal limits.  No hydronephrosis.  Stomach/Bowel: Stomach and visualized bowel are grossly unremarkable.  Vascular/Lymphatic: No evidence of abdominal aortic aneurysm.  No suspicious abdominal lymphadenopathy.  Other: Small volume fluid in the left mid abdomen.  Musculoskeletal: No focal osseous lesions.  IMPRESSION: Motion degraded images.  Acute pancreatitis with fluid/stranding along the distal pancreatic body/tail. No drainable fluid collection/pseudocyst.  Dilated common duct, measuring 20 mm, abruptly tapering at the ampulla. No choledocholithiasis is seen.  Associated irregular dilatation of the pancreatic duct which abruptly tapers in the region of the pancreatic head (double duct sign). While no definite pancreatic head mass is visualized on the current study, follow-up EUS or repeat MRI abdomen with/without contrast in 3-4 weeks to exclude an occult pancreatic head neoplasm.  Cholelithiasis, without associated inflammatory changes to suggest acute cholecystitis.  Electronically Signed   By: Julian Hy M.D.   On: 07/24/2014 16:17   Mr Jeananne Rama W/wo Cm/mrcp  07/24/2014   CLINICAL DATA:  Abdominal pain, acute pancreatitis with transaminitis, dilated CBD on ultrasound.  EXAM: MRI ABDOMEN WITHOUT AND WITH CONTRAST (INCLUDING MRCP)  TECHNIQUE: Multiplanar multisequence MR imaging of the abdomen was performed both before and after the administration of intravenous contrast. Heavily T2-weighted  images of the biliary and pancreatic ducts were obtained, and three-dimensional MRCP images were rendered by post processing.  CONTRAST:  67mL MULTIHANCE GADOBENATE DIMEGLUMINE 529 MG/ML IV SOLN  COMPARISON:  Abdominal ultrasound dated 07/18/2014. CT abdomen pelvis dated 01/12/2014.  FINDINGS: Motion degraded images.  Lower chest:  Lung bases are clear.  Hepatobiliary: Liver is within normal limits. No suspicious/enhancing hepatic lesions. No hepatic steatosis.  Mildly distended gallbladder with layering tiny gallstones/sludge (series 11/ image 33). No associated inflammatory changes.  Mild central intrahepatic ductal prominence. Common duct is dilated, measuring 20 mm, and abruptly tapers at the ampulla. No choledocholithiasis is seen.  Pancreas: Mild peripancreatic inflammatory changes with fluid/stranding along the distal pancreatic body/tail. No drainable fluid collection/pseudocyst.  Irregular dilatation of the pancreatic duct which abruptly tapers in the region of the pancreatic head (double duct sign). No definite pancreatic head mass is visualized, noting motion degradation.  Spleen: Within normal limits.  Adrenals/Urinary Tract: Adrenal glands are within normal limits.  Kidneys are within normal limits.  No hydronephrosis.  Stomach/Bowel: Stomach and visualized bowel are grossly unremarkable.  Vascular/Lymphatic: No evidence of abdominal aortic aneurysm.  No suspicious abdominal lymphadenopathy.  Other: Small volume fluid in the left mid abdomen.  Musculoskeletal: No focal osseous lesions.  IMPRESSION: Motion degraded images.  Acute pancreatitis with fluid/stranding along the distal pancreatic body/tail. No drainable fluid collection/pseudocyst.  Dilated common duct, measuring 20 mm, abruptly tapering at the ampulla. No choledocholithiasis is seen.  Associated irregular dilatation of the pancreatic duct which abruptly tapers in the region of the pancreatic head (double duct sign). While no definite  pancreatic head mass is visualized on the current study, follow-up EUS or repeat MRI abdomen with/without contrast in 3-4 weeks to exclude an occult pancreatic head neoplasm.  Cholelithiasis, without associated inflammatory changes to suggest acute cholecystitis.   Electronically Signed   By: Julian Hy M.D.   On: 07/24/2014 16:17    Microbiology: Recent Results (from the past 240 hour(s))  Surgical pcr screen     Status: Abnormal   Collection Time: 07/23/14 10:20 PM  Result Value Ref Range Status   MRSA, PCR NEGATIVE NEGATIVE Final   Staphylococcus aureus POSITIVE (A) NEGATIVE Final    Comment:        The Xpert SA Assay (FDA approved for NASAL specimens in patients over 8 years of age), is one component of a comprehensive surveillance program.  Test performance has been validated by [facility] for patients greater than or equal to 19 year old. It is not intended to diagnose infection nor to guide or monitor treatment.      Labs: Basic Metabolic Panel:  Recent Labs Lab 07/22/14 1517 07/23/14 0530 07/24/14 0455 07/25/14 0753  NA 136 139 140 140  K 4.3 4.1 3.9 3.8  CL 99* 106 106 106  CO2 26 27 26 25   GLUCOSE 124* 82 85 75  BUN 8 6 <5* <5*  CREATININE 0.69 0.56* 0.60* 0.55*  CALCIUM 9.3 8.6* 8.4* 8.5*   Liver Function Tests:  Recent Labs Lab 07/22/14 1517 07/23/14 0530 07/24/14 0455 07/25/14 0753  AST 47* 30 22 15   ALT 136* 100* 74* 51  ALKPHOS 332* 280* 272* 212*  BILITOT 0.6 0.6 0.7 0.5  PROT 6.8 6.0* 5.6* 5.5*  ALBUMIN 3.8 3.1* 2.9* 2.9*    Recent Labs Lab 07/22/14 1517 07/23/14 0530  LIPASE 337* 181*   No results for input(s): AMMONIA in the last 168 hours. CBC:  Recent Labs Lab 07/22/14 1517 07/23/14 0530 07/24/14 0455  WBC 7.6 6.2 5.3  HGB 12.6* 11.3* 11.2*  HCT 36.7* 34.0* 34.0*  MCV 93.1 95.2 96.0  PLT 326 274 253   Cardiac Enzymes:  Recent Labs Lab 07/18/14 1726  TROPONINI 0.06*   BNP: BNP (last 3  results)  Recent Labs  05/01/14 2153 06/06/14 2206  BNP 21.0 28.5    ProBNP (last 3 results) No results for input(s): PROBNP in the last 8760 hours.  CBG: No results for input(s): GLUCAP in the last 168 hours.     SignedLouellen Molder  [facility] 07/25/2014, 4:47 PM

## 2014-07-27 ENCOUNTER — Emergency Department (HOSPITAL_COMMUNITY)
Admission: EM | Admit: 2014-07-27 | Discharge: 2014-07-27 | Disposition: A | Discharge status: Home / Self Care | Payer: Medicare Other

## 2014-07-27 NOTE — ED Notes (Signed)
Pt asked tech first if he could go outside to smoke, and pt was advised that if he doesn't answer when his name is called, triage will move on to the next pt. Pt verbalized understanding and stated that he would like to leave because he has "been living with the pain anyway."

## 2014-07-30 ENCOUNTER — Encounter (HOSPITAL_COMMUNITY): Payer: Self-pay | Admitting: Emergency Medicine

## 2014-07-30 ENCOUNTER — Inpatient Hospital Stay (HOSPITAL_COMMUNITY)
Admission: EM | Admit: 2014-07-30 | Discharge: 2014-08-01 | DRG: 438 | Disposition: A | Discharge status: Home / Self Care | Payer: Medicare Other | Attending: Internal Medicine | Admitting: Internal Medicine

## 2014-07-30 DIAGNOSIS — F1010 Alcohol abuse, uncomplicated: Secondary | ICD-10-CM | POA: Diagnosis present

## 2014-07-30 DIAGNOSIS — I2510 Atherosclerotic heart disease of native coronary artery without angina pectoris: Secondary | ICD-10-CM | POA: Diagnosis present

## 2014-07-30 DIAGNOSIS — R1013 Epigastric pain: Secondary | ICD-10-CM | POA: Diagnosis not present

## 2014-07-30 DIAGNOSIS — G8929 Other chronic pain: Secondary | ICD-10-CM | POA: Diagnosis present

## 2014-07-30 DIAGNOSIS — Z7902 Long term (current) use of antithrombotics/antiplatelets: Secondary | ICD-10-CM

## 2014-07-30 DIAGNOSIS — K852 Alcohol induced acute pancreatitis: Secondary | ICD-10-CM | POA: Diagnosis not present

## 2014-07-30 DIAGNOSIS — K831 Obstruction of bile duct: Secondary | ICD-10-CM | POA: Diagnosis present

## 2014-07-30 DIAGNOSIS — G40909 Epilepsy, unspecified, not intractable, without status epilepticus: Secondary | ICD-10-CM

## 2014-07-30 DIAGNOSIS — I10 Essential (primary) hypertension: Secondary | ICD-10-CM | POA: Diagnosis present

## 2014-07-30 DIAGNOSIS — R001 Bradycardia, unspecified: Secondary | ICD-10-CM | POA: Diagnosis present

## 2014-07-30 DIAGNOSIS — F17210 Nicotine dependence, cigarettes, uncomplicated: Secondary | ICD-10-CM | POA: Diagnosis present

## 2014-07-30 DIAGNOSIS — I509 Heart failure, unspecified: Secondary | ICD-10-CM

## 2014-07-30 DIAGNOSIS — B888 Other specified infestations: Secondary | ICD-10-CM | POA: Diagnosis present

## 2014-07-30 DIAGNOSIS — I252 Old myocardial infarction: Secondary | ICD-10-CM

## 2014-07-30 DIAGNOSIS — Z7982 Long term (current) use of aspirin: Secondary | ICD-10-CM

## 2014-07-30 DIAGNOSIS — F1020 Alcohol dependence, uncomplicated: Secondary | ICD-10-CM | POA: Diagnosis present

## 2014-07-30 DIAGNOSIS — K861 Other chronic pancreatitis: Secondary | ICD-10-CM | POA: Diagnosis present

## 2014-07-30 DIAGNOSIS — I5030 Unspecified diastolic (congestive) heart failure: Secondary | ICD-10-CM | POA: Diagnosis present

## 2014-07-30 DIAGNOSIS — Z791 Long term (current) use of non-steroidal anti-inflammatories (NSAID): Secondary | ICD-10-CM

## 2014-07-30 DIAGNOSIS — R109 Unspecified abdominal pain: Secondary | ICD-10-CM

## 2014-07-30 DIAGNOSIS — Z955 Presence of coronary angioplasty implant and graft: Secondary | ICD-10-CM

## 2014-07-30 DIAGNOSIS — K859 Acute pancreatitis without necrosis or infection, unspecified: Secondary | ICD-10-CM | POA: Diagnosis present

## 2014-07-30 DIAGNOSIS — R7989 Other specified abnormal findings of blood chemistry: Secondary | ICD-10-CM | POA: Diagnosis present

## 2014-07-30 DIAGNOSIS — R945 Abnormal results of liver function studies: Secondary | ICD-10-CM

## 2014-07-30 DIAGNOSIS — F319 Bipolar disorder, unspecified: Secondary | ICD-10-CM | POA: Diagnosis present

## 2014-07-30 DIAGNOSIS — F200 Paranoid schizophrenia: Secondary | ICD-10-CM | POA: Diagnosis present

## 2014-07-30 HISTORY — DX: Cardiac arrest, cause unspecified: I46.9

## 2014-07-30 HISTORY — DX: Unspecified protein-calorie malnutrition: E46

## 2014-07-30 LAB — COMPREHENSIVE METABOLIC PANEL
ALT: 50 U/L (ref 17–63)
AST: 26 U/L (ref 15–41)
Albumin: 3.6 g/dL (ref 3.5–5.0)
Alkaline Phosphatase: 226 U/L — ABNORMAL HIGH (ref 38–126)
Anion gap: 12 (ref 5–15)
CHLORIDE: 98 mmol/L — AB (ref 101–111)
CO2: 28 mmol/L (ref 22–32)
Calcium: 9 mg/dL (ref 8.9–10.3)
Creatinine, Ser: 0.82 mg/dL (ref 0.61–1.24)
GFR calc Af Amer: >60 mL/min (ref >60)
Glucose, Bld: 133 mg/dL — ABNORMAL HIGH (ref 65–99)
Potassium: 3.6 mmol/L (ref 3.5–5.1)
Sodium: 138 mmol/L (ref 135–145)
TOTAL PROTEIN: 6.6 g/dL (ref 6.5–8.1)
Total Bilirubin: 0.5 mg/dL (ref 0.3–1.2)

## 2014-07-30 LAB — CBC
HEMATOCRIT: 36.3 % — AB (ref 39.0–52.0)
Hemoglobin: 12.3 g/dL — ABNORMAL LOW (ref 13.0–17.0)
MCH: 32 pg (ref 26.0–34.0)
MCHC: 33.9 g/dL (ref 30.0–36.0)
MCV: 94.5 fL (ref 78.0–100.0)
PLATELETS: 306 10*3/uL (ref 150–400)
RBC: 3.84 MIL/uL — AB (ref 4.22–5.81)
RDW: 14 % (ref 11.5–15.5)
WBC: 7.4 10*3/uL (ref 4.0–10.5)

## 2014-07-30 LAB — URINALYSIS, ROUTINE W REFLEX MICROSCOPIC
Bilirubin Urine: NEGATIVE
GLUCOSE, UA: NEGATIVE mg/dL
HGB URINE DIPSTICK: NEGATIVE
Ketones, ur: NEGATIVE mg/dL
LEUKOCYTES UA: NEGATIVE
Nitrite: NEGATIVE
Protein, ur: NEGATIVE mg/dL
Specific Gravity, Urine: 1.009 (ref 1.005–1.030)
UROBILINOGEN UA: 1 mg/dL (ref 0.0–1.0)
pH: 6.5 (ref 5.0–8.0)

## 2014-07-30 LAB — LIPASE, BLOOD: Lipase: 644 U/L — ABNORMAL HIGH (ref 22–51)

## 2014-07-30 MED ORDER — HYDROMORPHONE HCL 1 MG/ML IJ SOLN
1.0000 mg | Freq: Once | INTRAMUSCULAR | Status: AC
  Administered 2014-07-31: 1 mg via INTRAVENOUS
  Filled 2014-07-30: qty 1

## 2014-07-30 MED ORDER — OXYCODONE-ACETAMINOPHEN 5-325 MG PO TABS
ORAL_TABLET | ORAL | Status: AC
  Filled 2014-07-30: qty 1

## 2014-07-30 MED ORDER — ONDANSETRON HCL 4 MG/2ML IJ SOLN
4.0000 mg | Freq: Once | INTRAMUSCULAR | Status: AC
  Administered 2014-07-31: 4 mg via INTRAVENOUS
  Filled 2014-07-30: qty 2

## 2014-07-30 MED ORDER — OXYCODONE-ACETAMINOPHEN 5-325 MG PO TABS
1.0000 | ORAL_TABLET | Freq: Once | ORAL | Status: AC
  Administered 2014-07-30: 1 via ORAL

## 2014-07-30 MED ORDER — SODIUM CHLORIDE 0.9 % IV BOLUS (SEPSIS)
1000.0000 mL | Freq: Once | INTRAVENOUS | Status: AC
  Administered 2014-07-31: 1000 mL via INTRAVENOUS

## 2014-07-30 MED ORDER — SODIUM CHLORIDE 0.9 % IV SOLN
Freq: Once | INTRAVENOUS | Status: AC
  Administered 2014-07-31: 01:00:00 via INTRAVENOUS

## 2014-07-30 NOTE — ED Notes (Signed)
Pt. reports persistent right abdominal pain onset this week , denies emesis or diarrhea . No fever or chills.

## 2014-07-30 NOTE — ED Provider Notes (Signed)
Patient seen/examined in the Emergency Department in conjunction with Midlevel Provider  Patient reports recurrent epigastric abdominal pain Exam : awake/alert, epigastric tenderness Plan: pt signed out AMA on last admission, will need admission for GI evaluation    Ripley Fraise, MD 07/31/14 0001

## 2014-07-31 ENCOUNTER — Encounter (HOSPITAL_COMMUNITY): Payer: Self-pay | Admitting: Internal Medicine

## 2014-07-31 ENCOUNTER — Inpatient Hospital Stay (HOSPITAL_COMMUNITY): Payer: Medicare Other

## 2014-07-31 DIAGNOSIS — F319 Bipolar disorder, unspecified: Secondary | ICD-10-CM | POA: Diagnosis present

## 2014-07-31 DIAGNOSIS — G40909 Epilepsy, unspecified, not intractable, without status epilepticus: Secondary | ICD-10-CM

## 2014-07-31 DIAGNOSIS — R7989 Other specified abnormal findings of blood chemistry: Secondary | ICD-10-CM | POA: Diagnosis not present

## 2014-07-31 DIAGNOSIS — K831 Obstruction of bile duct: Secondary | ICD-10-CM

## 2014-07-31 DIAGNOSIS — F1010 Alcohol abuse, uncomplicated: Secondary | ICD-10-CM

## 2014-07-31 DIAGNOSIS — K859 Acute pancreatitis, unspecified: Secondary | ICD-10-CM

## 2014-07-31 DIAGNOSIS — R1013 Epigastric pain: Secondary | ICD-10-CM | POA: Diagnosis present

## 2014-07-31 DIAGNOSIS — R932 Abnormal findings on diagnostic imaging of liver and biliary tract: Secondary | ICD-10-CM

## 2014-07-31 DIAGNOSIS — Z955 Presence of coronary angioplasty implant and graft: Secondary | ICD-10-CM | POA: Diagnosis not present

## 2014-07-31 DIAGNOSIS — I2510 Atherosclerotic heart disease of native coronary artery without angina pectoris: Secondary | ICD-10-CM | POA: Diagnosis present

## 2014-07-31 DIAGNOSIS — F200 Paranoid schizophrenia: Secondary | ICD-10-CM | POA: Diagnosis present

## 2014-07-31 DIAGNOSIS — K852 Alcohol induced acute pancreatitis: Principal | ICD-10-CM

## 2014-07-31 DIAGNOSIS — Z791 Long term (current) use of non-steroidal anti-inflammatories (NSAID): Secondary | ICD-10-CM | POA: Diagnosis not present

## 2014-07-31 DIAGNOSIS — I5030 Unspecified diastolic (congestive) heart failure: Secondary | ICD-10-CM | POA: Diagnosis present

## 2014-07-31 DIAGNOSIS — R001 Bradycardia, unspecified: Secondary | ICD-10-CM | POA: Diagnosis present

## 2014-07-31 DIAGNOSIS — K861 Other chronic pancreatitis: Secondary | ICD-10-CM | POA: Diagnosis present

## 2014-07-31 DIAGNOSIS — I252 Old myocardial infarction: Secondary | ICD-10-CM | POA: Diagnosis not present

## 2014-07-31 DIAGNOSIS — G8929 Other chronic pain: Secondary | ICD-10-CM | POA: Diagnosis present

## 2014-07-31 DIAGNOSIS — Z7902 Long term (current) use of antithrombotics/antiplatelets: Secondary | ICD-10-CM | POA: Diagnosis not present

## 2014-07-31 DIAGNOSIS — I10 Essential (primary) hypertension: Secondary | ICD-10-CM | POA: Diagnosis present

## 2014-07-31 DIAGNOSIS — F17210 Nicotine dependence, cigarettes, uncomplicated: Secondary | ICD-10-CM | POA: Diagnosis present

## 2014-07-31 DIAGNOSIS — B888 Other specified infestations: Secondary | ICD-10-CM | POA: Diagnosis present

## 2014-07-31 DIAGNOSIS — F1020 Alcohol dependence, uncomplicated: Secondary | ICD-10-CM | POA: Diagnosis present

## 2014-07-31 DIAGNOSIS — Z7982 Long term (current) use of aspirin: Secondary | ICD-10-CM | POA: Diagnosis not present

## 2014-07-31 LAB — HEPATIC FUNCTION PANEL
ALBUMIN: 3.5 g/dL (ref 3.5–5.0)
ALT: 44 U/L (ref 17–63)
AST: 23 U/L (ref 15–41)
Alkaline Phosphatase: 210 U/L — ABNORMAL HIGH (ref 38–126)
BILIRUBIN DIRECT: 0.1 mg/dL (ref 0.1–0.5)
BILIRUBIN INDIRECT: 0.3 mg/dL (ref 0.3–0.9)
BILIRUBIN TOTAL: 0.4 mg/dL (ref 0.3–1.2)
TOTAL PROTEIN: 6.5 g/dL (ref 6.5–8.1)

## 2014-07-31 LAB — CREATININE, SERUM
Creatinine, Ser: 0.61 mg/dL (ref 0.61–1.24)
GFR calc non Af Amer: >60 mL/min (ref >60)

## 2014-07-31 LAB — ETHANOL

## 2014-07-31 LAB — CBC
HCT: 35.3 % — ABNORMAL LOW (ref 39.0–52.0)
Hemoglobin: 11.7 g/dL — ABNORMAL LOW (ref 13.0–17.0)
MCH: 31.7 pg (ref 26.0–34.0)
MCHC: 33.1 g/dL (ref 30.0–36.0)
MCV: 95.7 fL (ref 78.0–100.0)
PLATELETS: 264 10*3/uL (ref 150–400)
RBC: 3.69 MIL/uL — ABNORMAL LOW (ref 4.22–5.81)
RDW: 14 % (ref 11.5–15.5)
WBC: 6.6 10*3/uL (ref 4.0–10.5)

## 2014-07-31 LAB — GLUCOSE, CAPILLARY
GLUCOSE-CAPILLARY: 76 mg/dL (ref 65–99)
Glucose-Capillary: 79 mg/dL (ref 65–99)
Glucose-Capillary: 83 mg/dL (ref 65–99)
Glucose-Capillary: 114 mg/dL — ABNORMAL HIGH (ref 65–99)
Glucose-Capillary: 68 mg/dL (ref 65–99)

## 2014-07-31 LAB — LACTIC ACID, PLASMA: LACTIC ACID, VENOUS: 0.5 mmol/L (ref 0.5–2.0)

## 2014-07-31 LAB — TROPONIN I
Troponin I: <0.03 ng/mL (ref <0.031)
Troponin I: <0.03 ng/mL (ref <0.031)

## 2014-07-31 LAB — AMMONIA: AMMONIA: 27 umol/L (ref 9–35)

## 2014-07-31 LAB — LIPASE, BLOOD: LIPASE: 397 U/L — AB (ref 22–51)

## 2014-07-31 LAB — VALPROIC ACID LEVEL: VALPROIC ACID LVL: 28 ug/mL — AB (ref 50.0–100.0)

## 2014-07-31 MED ORDER — LORAZEPAM 1 MG PO TABS
1.0000 mg | ORAL_TABLET | Freq: Four times a day (QID) | ORAL | Status: DC | PRN
  Administered 2014-07-31 – 2014-08-01 (×2): 1 mg via ORAL
  Filled 2014-07-31 (×2): qty 1

## 2014-07-31 MED ORDER — VITAMIN B-1 100 MG PO TABS
100.0000 mg | ORAL_TABLET | Freq: Every day | ORAL | Status: DC
  Administered 2014-07-31 – 2014-08-01 (×2): 100 mg via ORAL
  Filled 2014-07-31 (×2): qty 1

## 2014-07-31 MED ORDER — CYANOCOBALAMIN 500 MCG PO TABS
500.0000 ug | ORAL_TABLET | Freq: Every day | ORAL | Status: DC
  Administered 2014-07-31 – 2014-08-01 (×2): 500 ug via ORAL
  Filled 2014-07-31 (×2): qty 1

## 2014-07-31 MED ORDER — HYDRALAZINE HCL 20 MG/ML IJ SOLN: 10.0000 mg | INTRAMUSCULAR | Status: DC | PRN

## 2014-07-31 MED ORDER — LORAZEPAM 2 MG/ML IJ SOLN: 1.0000 mg | Freq: Four times a day (QID) | INTRAMUSCULAR | Status: DC | PRN

## 2014-07-31 MED ORDER — SODIUM CHLORIDE 0.9 % IV SOLN
INTRAVENOUS | Status: DC
Start: 2014-07-31 — End: 2014-07-31

## 2014-07-31 MED ORDER — DIVALPROEX SODIUM 500 MG PO DR TAB
500.0000 mg | DELAYED_RELEASE_TABLET | Freq: Two times a day (BID) | ORAL | Status: DC
  Administered 2014-07-31 – 2014-08-01 (×3): 500 mg via ORAL
  Filled 2014-07-31 (×4): qty 1

## 2014-07-31 MED ORDER — QUETIAPINE FUMARATE 50 MG PO TABS
50.0000 mg | ORAL_TABLET | Freq: Every day | ORAL | Status: DC
  Administered 2014-07-31: 50 mg via ORAL
  Filled 2014-07-31 (×2): qty 1

## 2014-07-31 MED ORDER — OXYCODONE HCL 5 MG PO TABS: 5.0000 mg | ORAL_TABLET | ORAL | Status: DC | PRN

## 2014-07-31 MED ORDER — HALOPERIDOL DECANOATE 100 MG/ML IM SOLN: 100.0000 mg | INTRAMUSCULAR | Status: DC

## 2014-07-31 MED ORDER — ONDANSETRON HCL 4 MG/2ML IJ SOLN: 4.0000 mg | Freq: Three times a day (TID) | INTRAMUSCULAR | Status: AC | PRN

## 2014-07-31 MED ORDER — TRAZODONE HCL 100 MG PO TABS: 100.0000 mg | ORAL_TABLET | Freq: Every evening | ORAL | Status: DC | PRN

## 2014-07-31 MED ORDER — SODIUM CHLORIDE 0.9 % IV SOLN
INTRAVENOUS | Status: AC
  Administered 2014-07-31: 23:00:00 via INTRAVENOUS

## 2014-07-31 MED ORDER — ATORVASTATIN CALCIUM 80 MG PO TABS
80.0000 mg | ORAL_TABLET | Freq: Every day | ORAL | Status: DC
  Administered 2014-07-31 – 2014-08-01 (×2): 80 mg via ORAL
  Filled 2014-07-31 (×2): qty 1

## 2014-07-31 MED ORDER — ASPIRIN 81 MG PO CHEW
81.0000 mg | CHEWABLE_TABLET | Freq: Every day | ORAL | Status: DC
  Administered 2014-07-31 – 2014-08-01 (×2): 81 mg via ORAL
  Filled 2014-07-31 (×2): qty 1

## 2014-07-31 MED ORDER — MORPHINE SULFATE 2 MG/ML IJ SOLN
2.0000 mg | INTRAMUSCULAR | Status: DC | PRN
  Administered 2014-07-31 – 2014-08-01 (×4): 2 mg via INTRAVENOUS
  Filled 2014-07-31 (×4): qty 1

## 2014-07-31 MED ORDER — ACETAMINOPHEN 650 MG RE SUPP: 650.0000 mg | Freq: Four times a day (QID) | RECTAL | Status: DC | PRN

## 2014-07-31 MED ORDER — MORPHINE SULFATE 2 MG/ML IJ SOLN
1.0000 mg | INTRAMUSCULAR | Status: DC | PRN
  Administered 2014-07-31 (×2): 1 mg via INTRAVENOUS
  Filled 2014-07-31 (×2): qty 1

## 2014-07-31 MED ORDER — ACETAMINOPHEN 325 MG PO TABS: 650.0000 mg | ORAL_TABLET | Freq: Four times a day (QID) | ORAL | Status: DC | PRN

## 2014-07-31 MED ORDER — FOLIC ACID 1 MG PO TABS
1.0000 mg | ORAL_TABLET | Freq: Every day | ORAL | Status: DC
  Administered 2014-07-31 – 2014-08-01 (×2): 1 mg via ORAL
  Filled 2014-07-31 (×2): qty 1

## 2014-07-31 MED ORDER — FOLIC ACID 1 MG PO TABS: 1.0000 mg | ORAL_TABLET | Freq: Every day | ORAL | Status: DC

## 2014-07-31 MED ORDER — THIAMINE HCL 100 MG/ML IJ SOLN: 100.0000 mg | Freq: Every day | INTRAMUSCULAR | Status: DC

## 2014-07-31 MED ORDER — SODIUM CHLORIDE 0.9 % IV BOLUS (SEPSIS)
500.0000 mL | Freq: Once | INTRAVENOUS | Status: AC
  Administered 2014-07-31: 500 mL via INTRAVENOUS

## 2014-07-31 MED ORDER — HYDROMORPHONE HCL 1 MG/ML IJ SOLN: 1.0000 mg | INTRAMUSCULAR | Status: DC | PRN

## 2014-07-31 MED ORDER — BENZTROPINE MESYLATE 0.5 MG PO TABS
0.5000 mg | ORAL_TABLET | Freq: Two times a day (BID) | ORAL | Status: DC
  Administered 2014-07-31 – 2014-08-01 (×3): 0.5 mg via ORAL
  Filled 2014-07-31 (×4): qty 1

## 2014-07-31 MED ORDER — ONDANSETRON HCL 4 MG/2ML IJ SOLN: 4.0000 mg | Freq: Four times a day (QID) | INTRAMUSCULAR | Status: DC | PRN

## 2014-07-31 MED ORDER — ENOXAPARIN SODIUM 40 MG/0.4ML ~~LOC~~ SOLN
40.0000 mg | Freq: Every day | SUBCUTANEOUS | Status: DC
  Administered 2014-07-31 – 2014-08-01 (×2): 40 mg via SUBCUTANEOUS
  Filled 2014-07-31 (×2): qty 0.4

## 2014-07-31 MED ORDER — ONDANSETRON HCL 4 MG PO TABS: 4.0000 mg | ORAL_TABLET | Freq: Four times a day (QID) | ORAL | Status: DC | PRN

## 2014-07-31 MED ORDER — NICOTINE 21 MG/24HR TD PT24
21.0000 mg | MEDICATED_PATCH | Freq: Every day | TRANSDERMAL | Status: DC
  Administered 2014-07-31 – 2014-08-01 (×2): 21 mg via TRANSDERMAL
  Filled 2014-07-31 (×2): qty 1

## 2014-07-31 MED ORDER — LEVETIRACETAM 500 MG PO TABS
1000.0000 mg | ORAL_TABLET | Freq: Two times a day (BID) | ORAL | Status: DC
  Administered 2014-07-31 – 2014-08-01 (×3): 1000 mg via ORAL
  Filled 2014-07-31 (×3): qty 2

## 2014-07-31 MED ORDER — CLOPIDOGREL BISULFATE 75 MG PO TABS
75.0000 mg | ORAL_TABLET | Freq: Every day | ORAL | Status: DC
  Administered 2014-07-31 – 2014-08-01 (×2): 75 mg via ORAL
  Filled 2014-07-31 (×2): qty 1

## 2014-07-31 MED ORDER — ADULT MULTIVITAMIN W/MINERALS CH
1.0000 | ORAL_TABLET | Freq: Every day | ORAL | Status: DC
  Administered 2014-07-31 – 2014-08-01 (×2): 1 via ORAL
  Filled 2014-07-31 (×2): qty 1

## 2014-07-31 MED ORDER — ENSURE ENLIVE PO LIQD
237.0000 mL | Freq: Two times a day (BID) | ORAL | Status: DC
  Administered 2014-07-31 – 2014-08-01 (×3): 237 mL via ORAL

## 2014-07-31 NOTE — Progress Notes (Signed)
I have seen and assessed patient and agree with Dr. Cathlean Marseilles assessment and plan. Patient has been seen in consultation by GI and patient needs a EUS/ERCP for further evaluation when his acute pancreatitis episodes resolve. Patient still complaining of abdominal pain. Will make patient nothing by mouth. IV fluids. Pain management. Supportive care. Patient threatening to leave AMA. Patient has been leaving AMA and coming back with same complaints and a such will have psychiatric assessed patient for capacity. Discussed with patient's brother who is in agreement that patient needs to stay to be evaluated.

## 2014-07-31 NOTE — Progress Notes (Signed)
Utilization Review completed. Sharnise Blough RN BSN CM 

## 2014-07-31 NOTE — Progress Notes (Signed)
LOW CBG  Cbg: 68   Time: 7034  Intervention: RN walked into room and saw that patient was already consuming Ensure, though NPO at this time  CBG recheck: 76  Objective: asymptomatic, asking for pain medication at that time  Notes: Will contact MD if CBG does not improve or drops again.

## 2014-07-31 NOTE — Progress Notes (Signed)
Patient was ambulated in his room due to being on contact precautions for bed bugs. His heart rate increased to 74 while ambulating.  Paging Md because patient is requesting to be discharge or leave against medical advice.

## 2014-07-31 NOTE — Consult Note (Addendum)
[facility] Consult: 10:08 AM 07/31/2014  LOS: 0 days    Referring Provider: Dr Grandville Silos  Primary Care Physician:  Hauula PA Primary Gastroenterologist:  unassigned     Reason for Consultation:  Recurrent pancreatitis.     HPI: Chase Blackwell is a 49 y.o. male. 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  On Plavix though not clear which meds he actually takes.  Seizures. Bed bugs vs scabies 07/2014. Diastolic heart failure, schizophrenia. Most of his many hospitalizations end with pt leaving AMA.  IR placed G tube 07/2013.  01/2014 acute ETOH pancreatitis. CT with acute pancreatitis, mild PD dilation. Ultrasound with GB polyp vs sludge ball, 8 mm CBD without stone, but incompletely visualized. Left AMA. Admissions for seizure and ruled in for non Q MI early 06/2014.  Pancreatitis, attributed to ETOH, 07/02/2014, left AMA.  Admission 7/14 -7/15 with pancreatitis, Lipase 387, AST/ALT 241/326, alk phos 453, t bili 0.6.  Ultrasound with 49mm CBD, mild intrahepatic ductal dilation, GB sludge and stone vs polyp, but no wall thickening, no CBD stone. Dr Hilarie Fredrickson recommended MRCP but it was delayed as the patient needed to be de-loused and in meantime he left AMA.   Admission on 7/19 and left AMA on 7/21 with recurrent pancreatitis.  Lipase 337. AST/ALT 30/100, alk phos 280, t bili 0.6. MRCP (after he was deloused) showed acute pancreatitis, no drainable fluid collection, irregular/dilated PD, 20 mm CBD. No pancreatic mass and ductal dilatation felt likely due to benign stricturing, not cancer. Would need EUS but not done as pt left AMA and no EUS available last week.   Now back with usual pain, no vomiting or diarrhea and pancreatitis.  Lipase 644. LFTs improved. Appetite poor.  Again on contact precautions due to bugs found after socks removed.     Past Medical History  Diagnosis Date  . Hypertension   . Asthma   . GERD (gastroesophageal reflux disease)   . Chronic pain   . Schizophrenia   . Alcohol abuse   . Seizures   . Pancreatitis   . Depression   . CAD (coronary artery disease)     Past Surgical History  Procedure Laterality Date  . Left heart catheterization with coronary angiogram N/A 07/06/2013    Procedure: LEFT HEART CATHETERIZATION WITH CORONARY ANGIOGRAM;  Surgeon: Clent Demark, MD;  Location: [facility] CATH LAB;  Service: Cardiovascular;  Laterality: N/A;  . Percutaneous coronary stent intervention (pci-s)  07/06/2013    Procedure: PERCUTANEOUS CORONARY STENT INTERVENTION (PCI-S);  Surgeon: Clent Demark, MD;  Location: [facility] CATH LAB;  Service: Cardiovascular;;    Prior to Admission medications   Medication Sig Start Date End Date Taking? Authorizing Provider  aspirin 81 MG chewable tablet Chew 1 tablet (81 mg total) by mouth daily. 11/23/13  Yes Kristen N Bollier, DO  atorvastatin (LIPITOR) 80 MG tablet Take 1 tablet (80 mg total) by mouth daily. Patient taking differently: Take 80 mg by mouth at bedtime.  02/13/14  Yes Monico Blitz, PA-C  benztropine (COGENTIN) 0.5 MG tablet Take 0.5 mg by mouth 2 (two) times daily.   Yes Historical Provider, MD  clopidogrel (PLAVIX) 75 MG tablet Take 75 mg by mouth daily. 06/11/14  Yes Historical Provider, MD  divalproex (DEPAKOTE) 500 MG DR tablet Take 1 tablet (500 mg total) by mouth 2 (two) times daily. 02/13/14  Yes Nicole Pisciotta, PA-C  feeding supplement, ENSURE ENLIVE, (ENSURE ENLIVE) LIQD Take 237 mLs by mouth 2 (two) times daily between meals. 06/11/14  Yes Eugenie Filler, MD  folic acid (FOLVITE) 1 MG tablet Take 1 tablet (1 mg total) by mouth daily. 06/11/14  Yes Eugenie Filler, MD  furosemide (LASIX) 40 MG tablet Take 40 mg by mouth daily. 07/18/14  Yes  Historical Provider, MD  haloperidol decanoate (HALDOL DECANOATE) 100 MG/ML injection Inject 1 mL into the muscle every 28 (twenty-eight) days. 05/24/14  Yes Historical Provider, MD  hydrALAZINE (APRESOLINE) 10 MG tablet Take 1 tablet (10 mg total) by mouth 2 (two) times daily. 02/13/14  Yes Nicole Pisciotta, PA-C  levETIRAcetam (KEPPRA) 1000 MG tablet Take 1,000 mg by mouth 2 (two) times daily.   Yes Historical Provider, MD  lisinopril (PRINIVIL,ZESTRIL) 2.5 MG tablet Take 2.5 mg by mouth daily.   Yes Historical Provider, MD  LORazepam (ATIVAN) 0.5 MG tablet Take 0.5 mg by mouth at bedtime as needed for anxiety.   Yes Historical Provider, MD  meloxicam (MOBIC) 15 MG tablet Take 15 mg by mouth daily.   Yes Historical Provider, MD  metoprolol tartrate (LOPRESSOR) 25 MG tablet Take 0.5 tablets (12.5 mg total) by mouth 2 (two) times daily. Patient taking differently: Take 25 mg by mouth daily.  02/13/14  Yes Nicole Pisciotta, PA-C  Multiple Vitamin (MULTIVITAMIN WITH MINERALS) TABS tablet Take 1 tablet by mouth daily. 06/11/14  Yes Eugenie Filler, MD  QUEtiapine (SEROQUEL) 50 MG tablet Take 1 tablet (50 mg total) by mouth at bedtime. 02/13/14  Yes Nicole Pisciotta, PA-C  thiamine 100 MG tablet Take 1 tablet (100 mg total) by mouth daily. 06/11/14  Yes Eugenie Filler, MD  traZODone (DESYREL) 100 MG tablet Take 1 tablet by mouth at bedtime as needed for sleep.  05/13/14  Yes Historical Provider, MD  vitamin B-12 500 MCG tablet Take 1 tablet (500 mcg total) by mouth daily. 07/02/14  Yes Orson Eva, MD  levETIRAcetam (KEPPRA) 250 MG tablet Take 5 tablets (1,250 mg total) by mouth 2 (two) times daily. Patient not taking: Reported on 07/22/2014 06/18/14   Everlene Balls, MD    Scheduled Meds: . aspirin  81 mg Oral Daily  . atorvastatin  80 mg Oral Daily  . benztropine  0.5 mg Oral BID  . clopidogrel  75 mg Oral Daily  . vitamin B-12  500 mcg Oral Daily  . divalproex  500 mg Oral BID  . enoxaparin (LOVENOX)  injection  40 mg Subcutaneous Daily  . feeding supplement (ENSURE ENLIVE)  237 mL Oral BID BM  . folic acid  1 mg Oral Daily  . [START ON 08/27/2014] haloperidol decanoate  100 mg Intramuscular Q28 days  . levETIRAcetam  1,000 mg Oral BID  . multivitamin with minerals  1 tablet Oral Daily  . QUEtiapine  50 mg Oral QHS  . thiamine  100 mg Oral Daily   Or  . thiamine  100 mg Intravenous Daily   Infusions: . sodium chloride 125 mL/hr at 07/31/14 0346   PRN Meds: acetaminophen **OR** acetaminophen, hydrALAZINE, LORazepam **OR** LORazepam, morphine injection,  ondansetron (ZOFRAN) IV, ondansetron **OR** ondansetron (ZOFRAN) IV, traZODone   Allergies as of 07/30/2014 - Review Complete 07/30/2014  Allergen Reaction Noted  . Hctz [hydrochlorothiazide] Other (See Comments) 02/01/2013  . Hydroxyzine Hives 05/09/2011  . Sulfonamide derivatives Hives   . Cetirizine & related Other (See Comments) 05/09/2011    Family History  Problem Relation Age of Onset  . Malignant hyperthermia Mother   . Cirrhosis Father   . Alcohol abuse Father     History   Social History  . Marital Status: Single    Spouse Name: N/A  . Number of Children: N/A  . Years of Education: N/A   Occupational History  . Not on file.   Social History Main Topics  . Smoking status: Current Every Day Smoker -- 0.00 packs/day    Types: Cigarettes  . Smokeless tobacco: Not on file  . Alcohol Use: Yes     Comment: daily heavy drinker  . Drug Use: Yes    Special: Marijuana  . Sexual Activity: Not on file   Other Topics Concern  . Not on file   Social History Narrative    REVIEW OF SYSTEMS: Constitutional: 5 # weight loss in 8 days.  ENT: No nose bleeds Pulm: Denies SOB, cough CV: No palpitations, no LE edema.  GU: No hematuria, no frequency GI: No dyaphagia, no nausea.  Heme: No bleeding/bruising.  Transfusions: Denies.  Neuro: No headaches, no peripheral tingling or numbness Derm: No  itching, no rash or sores.  Endocrine: No sweats or chills. No polyuria or dysuria Immunization: Not queried Travel: None beyond local counties in last few months.    PHYSICAL EXAM: Vital signs in last 24 hours: Filed Vitals:   07/31/14 0628  BP: 117/67  Pulse: 54  Temp: 97.7 F (36.5 C)  Resp: 20   Wt Readings from Last 3 Encounters:  07/31/14 140 lb 3.4 oz (63.6 kg)  07/25/14 140 lb (63.504 kg)  07/16/14 146 lb 11.2 oz (66.543 kg)   General: thin, looks unhealthy but not toxic, vacant stare, not very verbal Head: No swelling or asymmetry  Eyes: No icterus Ears: Not HOH  Nose: No discharge or congestion Mouth: Clear and moist.  Neck: No mass or JVD Lungs: Clear bil. No cough or dyspnea Heart: RRR. No mrg. S1/s2 audible Abdomen: Soft, NT, ND. No mass. No HSM.  Rectal: deferred  Musc/Skeltl: no joint swelling or deformity Extremities: No CCE  Neurologic: Only oriented to himself and to cone, not to year.  Skin: Small punctate petechiae on legs Tattoos: On upper right deltoid region.  Nodes: No cervical adenopathy  Psych: Bland affect c/w "negative" schizophrenia sxs. Laconic but alert. Not currently agitated. Moves all 4 limbs.    Intake/Output from previous day: 07/26 0701 - 07/27 0700 In: -  Out: 775 [Urine:775] Intake/Output this shift:    LAB RESULTS:  Recent Labs  07/30/14 1938 07/31/14 0245  WBC 7.4 6.6  HGB 12.3* 11.7*  HCT 36.3* 35.3*  PLT 306 264   BMET Lab Results  Component Value Date   NA 138 07/30/2014   NA 140 07/25/2014   NA 140 07/24/2014   K 3.6 07/30/2014   K 3.8 07/25/2014   K 3.9 07/24/2014   CL 98* 07/30/2014   CL 106 07/25/2014   CL 106 07/24/2014   CO2 28 07/30/2014   CO2 25 07/25/2014   CO2 26 07/24/2014   GLUCOSE 133* 07/30/2014   GLUCOSE 75 07/25/2014   GLUCOSE 85 07/24/2014  BUN <5* 07/30/2014   BUN <5* 07/25/2014   BUN <5* 07/24/2014   CREATININE 0.61 07/31/2014    CREATININE 0.82 07/30/2014   CREATININE 0.55* 07/25/2014   CALCIUM 9.0 07/30/2014   CALCIUM 8.5* 07/25/2014   CALCIUM 8.4* 07/24/2014   LFT  Recent Labs  07/30/14 1938 07/31/14 0245  PROT 6.6 6.5  ALBUMIN 3.6 3.5  AST 26 23  ALT 50 44  ALKPHOS 226* 210*  BILITOT 0.5 0.4  BILIDIR  --  0.1  IBILI  --  0.3   PT/INR Lab Results  Component Value Date   INR 1.13 06/18/2014   INR 1.14 06/10/2014   INR 1.09 06/06/2014   Hepatitis Panel No results for input(s): HEPBSAG, HCVAB, HEPAIGM, HEPBIGM in the last 72 hours. C-Diff No components found for: CDIFF Lipase     Component Value Date/Time   LIPASE 397* 07/31/2014 0245    Drugs of Abuse     Component Value Date/Time   LABOPIA NONE DETECTED 07/22/2014 1717   LABOPIA NEGATIVE 07/06/2013 2352   COCAINSCRNUR NONE DETECTED 07/22/2014 1717   COCAINSCRNUR NEGATIVE 07/06/2013 2352   LABBENZ NONE DETECTED 07/22/2014 1717   LABBENZ POSITIVE* 07/06/2013 2352   AMPHETMU NONE DETECTED 07/22/2014 1717   AMPHETMU NEGATIVE 07/06/2013 2352   THCU NONE DETECTED 07/22/2014 1717   LABBARB NONE DETECTED 07/22/2014 1717     RADIOLOGY STUDIES: Dg Chest Port 1 View  07/31/2014   CLINICAL DATA:  Abdominal pain and hypotension  EXAM: PORTABLE CHEST - 1 VIEW  COMPARISON:  None.  FINDINGS: The heart size and mediastinal contours are within normal limits. Both lungs are clear. The visualized skeletal structures are unremarkable.  IMPRESSION: No active disease.   Electronically Signed   By: Andreas Newport M.D.   On: 07/31/2014 02:18   Dg Abd Portable 1v  07/31/2014   CLINICAL DATA:  Abdominal pain.  Hypotension.  EXAM: PORTABLE ABDOMEN - 1 VIEW  COMPARISON:  None.  FINDINGS: There is a generous volume of stool and air throughout the colon. There is no radiographic evidence of bowel obstruction or perforation. No biliary or urinary calculi are evident.  IMPRESSION: Generous volume colonic stool and gas without evidence of obstruction or  perforation.   Electronically Signed   By: Andreas Newport M.D.   On: 07/31/2014 02:17    ENDOSCOPIC STUDIES: none  IMPRESSION:   *  Recurrent, ongoing acute pancreatitis.  PD dilation and stricture and CBD dilation on MRCP last week.  *  Schizophrenia.  Leaves AMA all the time. Wonder if he is taking his psych meds or any of his other meds?   *  Alcoholism. ETOH level < 5. tox screen negative. Hep B and C, HIV negative.     PLAN:     *  Try to arrange EUS if he can stay long enough to accomplish this.  Requesting to leave AMA.  Needs competency hearing and perhaps to be kept inpt against his will so we can accomplish proper care.  Did get EUS/ercp consent signed.   *  Wonder about the use of ongoing Plavix in this completely unreliable pt.    Azucena Freed  07/31/2014, 10:08 AM Pager: [phone]      Attending physician's note   I have taken a history, examined the patient and reviewed the chart. I agree with the Advanced Practitioner's note, impression and recommendations. Recurrent vs unresolved acute pancreatitis. PD dilation, PD stricture and CBD dilation all likely due to chronic pancreatitis but a  panc mass and other disorders need to be excluded. Needs bowel rest and pain control. He has left AMA twice already this month. Consider psych evaluation/competency evaluation. Consider EUS/ERCP to further evaluate when acute pancreatitis resolves if patient will stay in hospital.   Ladene Artist, MD [facility]

## 2014-07-31 NOTE — Care Management Note (Signed)
Case Management Note  Patient Details  Name: Chase Blackwell MRN: [id number] Date of Birth: [birth year]  Subjective/Objective:                 Patient admitted with recurrent abdominal pain from acute pancreatitis. Patient noncompliant and has several recent admissions. Patient left AMA 07-25-14 and  07-19-14, and was dc'd 6/28 with pancreatitis.  Patient has a history of Schizophrenia, brain injury, and DM per RN report. Patient lives with brother who states he is POA (no paperwork at this time.)    Action/Plan:  Will continue to follow for home health needs.   Expected Discharge Date:                  Expected Discharge Plan:  Home/Self Care  In-House Referral:     Discharge planning Services  CM Consult  Post Acute Care Choice:    Choice offered to:     DME Arranged:    DME Agency:     HH Arranged:    HH Agency:     Status of Service:  In process, will continue to follow  Medicare Important Message Given:    Date Medicare IM Given:    Medicare IM give by:    Date Additional Medicare IM Given:    Additional Medicare Important Message give by:     If discussed at [place] of Stay Meetings, dates discussed:    Additional Comments:  Carles Collet, RN 07/31/2014, 2:45 PM

## 2014-07-31 NOTE — ED Provider Notes (Signed)
CSN: [id number]     Arrival date & time 07/30/14  1911 History   First MD Initiated Contact with Patient 07/30/14 2335     Chief Complaint  Patient presents with  . Abdominal Pain     (Consider location/radiation/quality/duration/timing/severity/associated sxs/prior Treatment) HPI Chase Blackwell is a 49 y.o. male with hx of HTN, asthma, GERD, schizophrenia, alcohol abuse, pancreatitis, CAD, presents to ED with complaint of abdominal pain, nausea, vomiting. Patient was recently admitted for the same. He left AMA 5 days ago. At that time was diagnosed with pancreatitis and common bile duct obstruction. There was a question of what was the next step given patients poor compliance and follow-up. Patient states that he left because he was feeling better. After he got home, patient started having recurrent abdominal pain that is now worse than before. He reports nausea vomiting. He states he is able to keep liquids down but unable to eat any solid food. He denies any diarrhea. No hematemesis or bloody or black stools. He has been taking all of his medications as prescribed. He states he is no longer drinking, however said his last drink was yesterday. He denies any drugs.  Past Medical History  Diagnosis Date  . Hypertension   . Asthma   . GERD (gastroesophageal reflux disease)   . Chronic pain   . Schizophrenia   . Alcohol abuse   . Seizures   . Pancreatitis   . Depression   . CAD (coronary artery disease)    Past Surgical History  Procedure Laterality Date  . Left heart catheterization with coronary angiogram N/A 07/06/2013    Procedure: LEFT HEART CATHETERIZATION WITH CORONARY ANGIOGRAM;  Surgeon: Clent Demark, MD;  Location: [facility] CATH LAB;  Service: Cardiovascular;  Laterality: N/A;  . Percutaneous coronary stent intervention (pci-s)  07/06/2013    Procedure: PERCUTANEOUS CORONARY STENT INTERVENTION (PCI-S);  Surgeon: Clent Demark, MD;  Location: [facility] CATH LAB;  Service: Cardiovascular;;    Family History  Problem Relation Age of Onset  . Malignant hyperthermia Mother   . Cirrhosis Father   . Alcohol abuse Father    History  Substance Use Topics  . Smoking status: Current Every Day Smoker -- 0.00 packs/day    Types: Cigarettes  . Smokeless tobacco: Not on file  . Alcohol Use: Yes     Comment: daily heavy drinker    Review of Systems  Constitutional: Positive for appetite change and fatigue. Negative for fever and chills.  Respiratory: Negative for cough, chest tightness and shortness of breath.   Cardiovascular: Negative for chest pain, palpitations and leg swelling.  Gastrointestinal: Positive for nausea, vomiting and abdominal pain. Negative for diarrhea and abdominal distention.  Genitourinary: Negative for dysuria, urgency, frequency and hematuria.  Musculoskeletal: Negative for myalgias, arthralgias, neck pain and neck stiffness.  Skin: Negative for rash.  Allergic/Immunologic: Negative for immunocompromised state.  Neurological: Positive for weakness. Negative for dizziness, light-headedness, numbness and headaches.  All other systems reviewed and are negative.     Allergies  Hctz; Hydroxyzine; Sulfonamide derivatives; and Cetirizine & related  Home Medications   Prior to Admission medications   Medication Sig Start Date End Date Taking? Authorizing Provider  aspirin 81 MG chewable tablet Chew 1 tablet (81 mg total) by mouth daily. 11/23/13  Yes Kristen N Holstine, DO  atorvastatin (LIPITOR) 80 MG tablet Take 1 tablet (80 mg total) by mouth daily. Patient taking differently: Take 80 mg by mouth at bedtime.  02/13/14  Yes Nicole Pisciotta,  PA-C  benztropine (COGENTIN) 0.5 MG tablet Take 0.5 mg by mouth 2 (two) times daily.   Yes Historical Provider, MD  clopidogrel (PLAVIX) 75 MG tablet Take 75 mg by mouth daily. 06/11/14  Yes Historical Provider, MD  divalproex (DEPAKOTE) 500 MG DR tablet Take 1 tablet (500 mg total) by mouth 2 (two) times daily. 02/13/14   Yes Nicole Pisciotta, PA-C  feeding supplement, ENSURE ENLIVE, (ENSURE ENLIVE) LIQD Take 237 mLs by mouth 2 (two) times daily between meals. 06/11/14  Yes Eugenie Filler, MD  folic acid (FOLVITE) 1 MG tablet Take 1 tablet (1 mg total) by mouth daily. 06/11/14  Yes Eugenie Filler, MD  furosemide (LASIX) 40 MG tablet Take 40 mg by mouth daily. 07/18/14  Yes Historical Provider, MD  haloperidol decanoate (HALDOL DECANOATE) 100 MG/ML injection Inject 1 mL into the muscle every 28 (twenty-eight) days. 05/24/14  Yes Historical Provider, MD  hydrALAZINE (APRESOLINE) 10 MG tablet Take 1 tablet (10 mg total) by mouth 2 (two) times daily. 02/13/14  Yes Nicole Pisciotta, PA-C  levETIRAcetam (KEPPRA) 1000 MG tablet Take 1,000 mg by mouth 2 (two) times daily.   Yes Historical Provider, MD  lisinopril (PRINIVIL,ZESTRIL) 2.5 MG tablet Take 2.5 mg by mouth daily.   Yes Historical Provider, MD  LORazepam (ATIVAN) 0.5 MG tablet Take 0.5 mg by mouth at bedtime as needed for anxiety.   Yes Historical Provider, MD  meloxicam (MOBIC) 15 MG tablet Take 15 mg by mouth daily.   Yes Historical Provider, MD  metoprolol tartrate (LOPRESSOR) 25 MG tablet Take 0.5 tablets (12.5 mg total) by mouth 2 (two) times daily. Patient taking differently: Take 25 mg by mouth daily.  02/13/14  Yes Nicole Pisciotta, PA-C  Multiple Vitamin (MULTIVITAMIN WITH MINERALS) TABS tablet Take 1 tablet by mouth daily. 06/11/14  Yes Eugenie Filler, MD  QUEtiapine (SEROQUEL) 50 MG tablet Take 1 tablet (50 mg total) by mouth at bedtime. 02/13/14  Yes Nicole Pisciotta, PA-C  thiamine 100 MG tablet Take 1 tablet (100 mg total) by mouth daily. 06/11/14  Yes Eugenie Filler, MD  traZODone (DESYREL) 100 MG tablet Take 1 tablet by mouth at bedtime as needed for sleep.  05/13/14  Yes Historical Provider, MD  vitamin B-12 500 MCG tablet Take 1 tablet (500 mcg total) by mouth daily. 07/02/14  Yes Orson Eva, MD  levETIRAcetam (KEPPRA) 250 MG tablet Take 5 tablets  (1,250 mg total) by mouth 2 (two) times daily. Patient not taking: Reported on 07/22/2014 06/18/14   Everlene Balls, MD   BP 98/66 mmHg  Pulse 65  Temp(Src) 97.7 F (36.5 C) (Oral)  Resp 20  Ht 6' (1.829 m)  Wt 143 lb (64.864 kg)  BMI 19.39 kg/m2  SpO2 99% Physical Exam  Constitutional: He appears well-developed and well-nourished. No distress.  HENT:  Head: Normocephalic and atraumatic.  Eyes: Conjunctivae are normal.  Neck: Neck supple.  Cardiovascular: Normal rate, regular rhythm and normal heart sounds.   Pulmonary/Chest: Effort normal and breath sounds normal. No respiratory distress. He has no wheezes. He has no rales.  Abdominal: Soft. Bowel sounds are normal. He exhibits no distension. There is tenderness. There is no rebound.  Diffuse tenderness, worse in RUQ and LUQ  Musculoskeletal: He exhibits no edema.  Neurological: He is alert.  Skin: Skin is warm and dry.  Nursing note and vitals reviewed.   ED Course  Procedures (including critical care time) Labs Review Labs Reviewed  LIPASE, BLOOD - Abnormal; Notable for  the following:    Lipase 644 (*)    All other components within normal limits  COMPREHENSIVE METABOLIC PANEL - Abnormal; Notable for the following:    Chloride 98 (*)    Glucose, Bld 133 (*)    BUN <5 (*)    Alkaline Phosphatase 226 (*)    All other components within normal limits  CBC - Abnormal; Notable for the following:    RBC 3.84 (*)    Hemoglobin 12.3 (*)    HCT 36.3 (*)    All other components within normal limits  URINALYSIS, ROUTINE W REFLEX MICROSCOPIC (NOT AT [facility])  ETHANOL    Imaging Review Dg Chest Port 1 View  07/31/2014   CLINICAL DATA:  Abdominal pain and hypotension  EXAM: PORTABLE CHEST - 1 VIEW  COMPARISON:  None.  FINDINGS: The heart size and mediastinal contours are within normal limits. Both lungs are clear. The visualized skeletal structures are unremarkable.  IMPRESSION: No active disease.   Electronically Signed   By: Andreas Newport M.D.   On: 07/31/2014 02:18   Dg Abd Portable 1v  07/31/2014   CLINICAL DATA:  Abdominal pain.  Hypotension.  EXAM: PORTABLE ABDOMEN - 1 VIEW  COMPARISON:  None.  FINDINGS: There is a generous volume of stool and air throughout the colon. There is no radiographic evidence of bowel obstruction or perforation. No biliary or urinary calculi are evident.  IMPRESSION: Generous volume colonic stool and gas without evidence of obstruction or perforation.   Electronically Signed   By: Andreas Newport M.D.   On: 07/31/2014 02:17     EKG Interpretation None      MDM   Final diagnoses:  Acute pancreatitis, unspecified pancreatitis type    Patients with recurrent pancreatitis, labs already obtained by triage nurse, lipase is 644. The patient with multiple admissions and visits to the ER for the same. Was Found to have common bile duct obstruction, followed by GI while in the hospital, definitive treatment plan is not in place given patient's poor compliance. Will start IV fluids, pain medications and Zofran ordered. Will admit patient for further treatment.   12:48 AM Spoke with triad hospitalist, will admit. Recurrent pancreatitis, intractable symptoms. Vital signs are normal. Filed Vitals:   07/30/14 1929 07/30/14 1930 07/30/14 2241  BP:  133/82 98/66  Pulse:  87 65  Temp:  97.2 F (36.2 C) 97.7 F (36.5 C)  TempSrc:  Oral Oral  Resp:  16 20  Height: 6' (1.829 m)    Weight: 143 lb (64.864 kg)    SpO2:  100% 99%        Jeannett Senior, PA-C 08/01/14 0015  Ripley Fraise, MD 08/01/14 [phone]

## 2014-07-31 NOTE — H&P (Signed)
[facility] History and Physical  Levaughn Puccinelli Arif NLZ:[id number] DOB: November 18, 1965 DOA: 07/30/2014  Referring physician: Ms. Dan Europe. PCP: [facility]  Specialists: None.  Chief Complaint: Abdominal pain.  HPI: MISHON BLUBAUGH is a 49 y.o. male with history of pancreatitis, alcoholism, hypertension diastolic dysfunction and seizures who was admitted last week for acute pancreatitis and MRCP had shown dilation of the common bile duct with abrupt narrowing at the ampulla and EUS was recommended but patient left AMA presents to the ER with worsening abdominal pain. Pain is across his abdomen denies any associated nausea vomiting diarrhea. Patient states he had not been eating well. Denies any chest pain shortness of breath or fever chills. Lipase is found to be elevated with chest x-ray and KUB not showing anything acute except for large amount of stools.   Review of Systems: As presented in the history of presenting illness, rest negative.  Past Medical History  Diagnosis Date  . Hypertension   . Asthma   . GERD (gastroesophageal reflux disease)   . Chronic pain   . Schizophrenia   . Alcohol abuse   . Seizures   . Pancreatitis   . Depression   . CAD (coronary artery disease)    Past Surgical History  Procedure Laterality Date  . Left heart catheterization with coronary angiogram N/A 07/06/2013    Procedure: LEFT HEART CATHETERIZATION WITH CORONARY ANGIOGRAM;  Surgeon: Clent Demark, MD;  Location: [facility] CATH LAB;  Service: Cardiovascular;  Laterality: N/A;  . Percutaneous coronary stent intervention (pci-s)  07/06/2013    Procedure: PERCUTANEOUS CORONARY STENT INTERVENTION (PCI-S);  Surgeon: Clent Demark, MD;  Location: [facility] CATH LAB;  Service: Cardiovascular;;   Social History:  reports that he has been smoking Cigarettes.  He has been smoking about 0.00 packs per day. He does not have any smokeless tobacco history on file. He reports that he drinks alcohol. He reports that he  uses illicit drugs (Marijuana). Where does patient live home. Can patient participate in ADLs? Yes.  Allergies  Allergen Reactions  . Hctz [Hydrochlorothiazide] Other (See Comments)    Dizzy spells  . Hydroxyzine Hives  . Sulfonamide Derivatives Hives  . Cetirizine & Related Other (See Comments)    Brothers were not aware of this allergy    Family History:  Family History  Problem Relation Age of Onset  . Malignant hyperthermia Mother   . Cirrhosis Father   . Alcohol abuse Father       Prior to Admission medications   Medication Sig Start Date End Date Taking? Authorizing Provider  aspirin 81 MG chewable tablet Chew 1 tablet (81 mg total) by mouth daily. 11/23/13  Yes Kristen N Brach, DO  atorvastatin (LIPITOR) 80 MG tablet Take 1 tablet (80 mg total) by mouth daily. Patient taking differently: Take 80 mg by mouth at bedtime.  02/13/14  Yes Nicole Pisciotta, PA-C  benztropine (COGENTIN) 0.5 MG tablet Take 0.5 mg by mouth 2 (two) times daily.   Yes Historical Provider, MD  clopidogrel (PLAVIX) 75 MG tablet Take 75 mg by mouth daily. 06/11/14  Yes Historical Provider, MD  divalproex (DEPAKOTE) 500 MG DR tablet Take 1 tablet (500 mg total) by mouth 2 (two) times daily. 02/13/14  Yes Nicole Pisciotta, PA-C  feeding supplement, ENSURE ENLIVE, (ENSURE ENLIVE) LIQD Take 237 mLs by mouth 2 (two) times daily between meals. 06/11/14  Yes Eugenie Filler, MD  folic acid (FOLVITE) 1 MG tablet Take 1 tablet (1 mg total) by  mouth daily. 06/11/14  Yes Eugenie Filler, MD  furosemide (LASIX) 40 MG tablet Take 40 mg by mouth daily. 07/18/14  Yes Historical Provider, MD  haloperidol decanoate (HALDOL DECANOATE) 100 MG/ML injection Inject 1 mL into the muscle every 28 (twenty-eight) days. 05/24/14  Yes Historical Provider, MD  hydrALAZINE (APRESOLINE) 10 MG tablet Take 1 tablet (10 mg total) by mouth 2 (two) times daily. 02/13/14  Yes Nicole Pisciotta, PA-C  levETIRAcetam (KEPPRA) 1000 MG tablet Take 1,000  mg by mouth 2 (two) times daily.   Yes Historical Provider, MD  lisinopril (PRINIVIL,ZESTRIL) 2.5 MG tablet Take 2.5 mg by mouth daily.   Yes Historical Provider, MD  LORazepam (ATIVAN) 0.5 MG tablet Take 0.5 mg by mouth at bedtime as needed for anxiety.   Yes Historical Provider, MD  meloxicam (MOBIC) 15 MG tablet Take 15 mg by mouth daily.   Yes Historical Provider, MD  metoprolol tartrate (LOPRESSOR) 25 MG tablet Take 0.5 tablets (12.5 mg total) by mouth 2 (two) times daily. Patient taking differently: Take 25 mg by mouth daily.  02/13/14  Yes Nicole Pisciotta, PA-C  Multiple Vitamin (MULTIVITAMIN WITH MINERALS) TABS tablet Take 1 tablet by mouth daily. 06/11/14  Yes Eugenie Filler, MD  QUEtiapine (SEROQUEL) 50 MG tablet Take 1 tablet (50 mg total) by mouth at bedtime. 02/13/14  Yes Nicole Pisciotta, PA-C  thiamine 100 MG tablet Take 1 tablet (100 mg total) by mouth daily. 06/11/14  Yes Eugenie Filler, MD  traZODone (DESYREL) 100 MG tablet Take 1 tablet by mouth at bedtime as needed for sleep.  05/13/14  Yes Historical Provider, MD  vitamin B-12 500 MCG tablet Take 1 tablet (500 mcg total) by mouth daily. 07/02/14  Yes Orson Eva, MD  levETIRAcetam (KEPPRA) 250 MG tablet Take 5 tablets (1,250 mg total) by mouth 2 (two) times daily. Patient not taking: Reported on 07/22/2014 06/18/14   Everlene Balls, MD    Physical Exam: Filed Vitals:   07/31/14 0115 07/31/14 0228 07/31/14 0232 07/31/14 0242  BP: 93/49  111/60   Pulse: 46  60   Temp:   97.6 F (36.4 C)   TempSrc:   Oral   Resp: 13  16   Height:  6' (1.829 m)    Weight:    64.7 kg (142 lb 10.2 oz)  SpO2: 99%  100%      General:  Moderately built and poorly nourished.  Eyes: Anicteric no pallor.  ENT: No discharge from the ears eyes nose or mouth.  Neck: No mass felt.  Cardiovascular: S1 and S2 heard.  Respiratory: No rhonchi or crepitations.  Abdomen: Soft nontender bowel sounds present.  Skin: No rash.  Musculoskeletal: No  edema.  Psychiatric: Patient is mildly drowsy.  Neurologic: Mildly drowsy but moves all extremities and answers questions.  Labs on Admission:  Basic Metabolic Panel:  Recent Labs Lab 07/24/14 0455 07/25/14 0753 07/30/14 1938  NA 140 140 138  K 3.9 3.8 3.6  CL 106 106 98*  CO2 26 25 28   GLUCOSE 85 75 133*  BUN <5* <5* <5*  CREATININE 0.60* 0.55* 0.82  CALCIUM 8.4* 8.5* 9.0   Liver Function Tests:  Recent Labs Lab 07/24/14 0455 07/25/14 0753 07/30/14 1938  AST 22 15 26   ALT 74* 51 50  ALKPHOS 272* 212* 226*  BILITOT 0.7 0.5 0.5  PROT 5.6* 5.5* 6.6  ALBUMIN 2.9* 2.9* 3.6    Recent Labs Lab 07/30/14 1938  LIPASE 644*    Recent  Labs Lab 07/31/14 0012  AMMONIA 27   CBC:  Recent Labs Lab 07/24/14 0455 07/30/14 1938  WBC 5.3 7.4  HGB 11.2* 12.3*  HCT 34.0* 36.3*  MCV 96.0 94.5  PLT 253 306   Cardiac Enzymes:  Recent Labs Lab 07/31/14 0012  TROPONINI <0.03    BNP (last 3 results)  Recent Labs  05/01/14 2153 06/06/14 2206  BNP 21.0 28.5    ProBNP (last 3 results) No results for input(s): PROBNP in the last 8760 hours.  CBG: No results for input(s): GLUCAP in the last 168 hours.  Radiological Exams on Admission: Dg Chest Port 1 View  07/31/2014   CLINICAL DATA:  Abdominal pain and hypotension  EXAM: PORTABLE CHEST - 1 VIEW  COMPARISON:  None.  FINDINGS: The heart size and mediastinal contours are within normal limits. Both lungs are clear. The visualized skeletal structures are unremarkable.  IMPRESSION: No active disease.   Electronically Signed   By: Andreas Newport M.D.   On: 07/31/2014 02:18   Dg Abd Portable 1v  07/31/2014   CLINICAL DATA:  Abdominal pain.  Hypotension.  EXAM: PORTABLE ABDOMEN - 1 VIEW  COMPARISON:  None.  FINDINGS: There is a generous volume of stool and air throughout the colon. There is no radiographic evidence of bowel obstruction or perforation. No biliary or urinary calculi are evident.  IMPRESSION: Generous  volume colonic stool and gas without evidence of obstruction or perforation.   Electronically Signed   By: Andreas Newport M.D.   On: 07/31/2014 02:17    EKG: Independently reviewed. Sinus bradycardia with early repolarization changes and ST T changes comparable to old EKG.  Assessment/Plan Active Problems:   Alcohol abuse   Acute pancreatitis   CHF (congestive heart failure)   Seizure disorder   Elevated LFTs   Common bile duct (CBD) obstruction   1. Acute pancreatitis - probably alcohol related but patient also has CBD dilation with upper narrowing as per the recent MRI. I have placed patient on clear liquid diet. Pain relief medications consult GI in a.m. 2. Alcohol abuse - patient states his last drink was yesterday. Patient has been placed on CIWA. 3. Bed bug infestation - patient is placed on contacts. 4. Schizophrenia and bipolar disorder - continue present medications. 5. History of CAD - check troponin. Continue Plavix and statins. 6. Bradycardia - holding off beta blockers. Closely monitor in telemetry. 7. History of hypertension patient is mildly hypotensive at this time so holding all antihypertensives and kept patient on when necessary IV hydralazine. 8. History of seizures on Keppra. 9. History of diastolic dysfunction EF measured in June 2016 was 55-60% with grade 1 diastolic dysfunction. Holding off Lasix due to low blood pressure. Close to follow respiratory status.  I have reviewed patient's old charts and labs. I have personally reviewed patient's chest x-ray and EKG.   DVT Prophylaxis Lovenox.  Code Status: Full code.  Family Communication: Discussed with patient.  Disposition Plan: Admit to inpatient.    KAKRAKANDY,ARSHAD N. [facility] Pager [phone].  If 7PM-7AM, please contact night-coverage [email or web address] Password [facility] 07/31/2014, 3:35 AM

## 2014-07-31 NOTE — Progress Notes (Signed)
Pt arrived to unit. Pt was oriented to room and call bell system. Explained to pt how to use room phone to call nurse. Paged provider to inform of patient's arrival to unit. Pt in no apparent distress at this time. Will continue to monitor pt.

## 2014-07-31 NOTE — Progress Notes (Signed)
Pt with heart rate dropping to 30s with lowest of 35 and asymptomatic. Paged provider Hal Hope. Patient to get a STAT EKG.

## 2014-08-01 ENCOUNTER — Encounter (HOSPITAL_COMMUNITY): Payer: Self-pay | Admitting: Physician Assistant

## 2014-08-01 DIAGNOSIS — F200 Paranoid schizophrenia: Secondary | ICD-10-CM

## 2014-08-01 LAB — GLUCOSE, CAPILLARY
GLUCOSE-CAPILLARY: 82 mg/dL (ref 65–99)
GLUCOSE-CAPILLARY: 83 mg/dL (ref 65–99)
Glucose-Capillary: 87 mg/dL (ref 65–99)
Glucose-Capillary: 92 mg/dL (ref 65–99)

## 2014-08-01 LAB — CBC
HCT: 34.6 % — ABNORMAL LOW (ref 39.0–52.0)
Hemoglobin: 11.5 g/dL — ABNORMAL LOW (ref 13.0–17.0)
MCH: 31.9 pg (ref 26.0–34.0)
MCHC: 33.2 g/dL (ref 30.0–36.0)
MCV: 96.1 fL (ref 78.0–100.0)
Platelets: 233 10*3/uL (ref 150–400)
RBC: 3.6 MIL/uL — ABNORMAL LOW (ref 4.22–5.81)
RDW: 13.9 % (ref 11.5–15.5)
WBC: 4.6 10*3/uL (ref 4.0–10.5)

## 2014-08-01 LAB — COMPREHENSIVE METABOLIC PANEL
ALK PHOS: 213 U/L — AB (ref 38–126)
ALT: 42 U/L (ref 17–63)
AST: 25 U/L (ref 15–41)
Albumin: 3 g/dL — ABNORMAL LOW (ref 3.5–5.0)
Anion gap: 6 (ref 5–15)
BUN: <5 mg/dL — ABNORMAL LOW (ref 6–20)
CO2: 26 mmol/L (ref 22–32)
Calcium: 8.5 mg/dL — ABNORMAL LOW (ref 8.9–10.3)
Chloride: 110 mmol/L (ref 101–111)
Creatinine, Ser: 0.55 mg/dL — ABNORMAL LOW (ref 0.61–1.24)
GFR calc Af Amer: >60 mL/min (ref >60)
Glucose, Bld: 79 mg/dL (ref 65–99)
Potassium: 3.8 mmol/L (ref 3.5–5.1)
Sodium: 142 mmol/L (ref 135–145)
Total Bilirubin: 0.5 mg/dL (ref 0.3–1.2)
Total Protein: 5.5 g/dL — ABNORMAL LOW (ref 6.5–8.1)

## 2014-08-01 LAB — LIPASE, BLOOD: LIPASE: 162 U/L — AB (ref 22–51)

## 2014-08-01 MED ORDER — SODIUM CHLORIDE 0.9 % IV SOLN
INTRAVENOUS | Status: DC
  Administered 2014-08-01: 09:00:00 via INTRAVENOUS

## 2014-08-01 NOTE — Progress Notes (Signed)
Daily Rounding Note  08/01/2014, 10:45 AM  LOS: 1 day   SUBJECTIVE:       Pain better, not resolved, with Morphine.  Lipase declining.  Npo except chip/sips. No nausea.  Asking for Ensure.  No BMs  OBJECTIVE:         Vital signs in last 24 hours:    Temp:  [97.6 F (36.4 C)-98.4 F (36.9 C)] 97.6 F (36.4 C) (07/28 0502) Pulse Rate:  [56-76] 71 (07/28 0502) Resp:  [16-20] 20 (07/28 0502) BP: (107-150)/(72-88) 132/88 mmHg (07/28 0502) SpO2:  [98 %-100 %] 98 % (07/28 0040) Weight:  [138 lb 0.1 oz (62.6 kg)] 138 lb 0.1 oz (62.6 kg) (07/28 0502) Last BM Date:  (PTA) Filed Weights   07/31/14 0242 07/31/14 0624 08/01/14 0502  Weight: 142 lb 10.2 oz (64.7 kg) 140 lb 3.4 oz (63.6 kg) 138 lb 0.1 oz (62.6 kg)   General: does not look ill.  Comfortable.  alert   Heart: RRR Chest: clear bil.  BS reduced on right.  Abdomen: soft, ND.  Mild tenderness in upper abdomen, no guard or rebound.  Active BS  Extremities: no CCE Neuro/Psych:  Appropriate.  Follows commands.   Intake/Output from previous day: 07/27 0701 - 07/28 0700 In: 240 [P.O.:240] Out: -   Intake/Output this shift:    Lab Results:  Recent Labs  07/30/14 1938 07/31/14 0245 08/01/14 0515  WBC 7.4 6.6 4.6  HGB 12.3* 11.7* 11.5*  HCT 36.3* 35.3* 34.6*  PLT 306 264 233   BMET  Recent Labs  07/30/14 1938 07/31/14 0245 08/01/14 0515  NA 138  --  142  K 3.6  --  3.8  CL 98*  --  110  CO2 28  --  26  GLUCOSE 133*  --  79  BUN <5*  --  <5*  CREATININE 0.82 0.61 0.55*  CALCIUM 9.0  --  8.5*   LFT  Recent Labs  07/30/14 1938 07/31/14 0245 08/01/14 0515  PROT 6.6 6.5 5.5*  ALBUMIN 3.6 3.5 3.0*  AST 26 23 25   ALT 50 44 42  ALKPHOS 226* 210* 213*  BILITOT 0.5 0.4 0.5  BILIDIR  --  0.1  --   IBILI  --  0.3  --    PT/INR No results for input(s): LABPROT, INR in the last 72 hours. Hepatitis Panel No results for input(s): HEPBSAG, HCVAB,  HEPAIGM, HEPBIGM in the last 72 hours.  Studies/Results: Dg Chest Port 1 View  07/31/2014   CLINICAL DATA:  Abdominal pain and hypotension  EXAM: PORTABLE CHEST - 1 VIEW  COMPARISON:  None.  FINDINGS: The heart size and mediastinal contours are within normal limits. Both lungs are clear. The visualized skeletal structures are unremarkable.  IMPRESSION: No active disease.   Electronically Signed   By: Andreas Newport M.D.   On: 07/31/2014 02:18   Dg Abd Portable 1v  07/31/2014   CLINICAL DATA:  Abdominal pain.  Hypotension.  EXAM: PORTABLE ABDOMEN - 1 VIEW  COMPARISON:  None.  FINDINGS: There is a generous volume of stool and air throughout the colon. There is no radiographic evidence of bowel obstruction or perforation. No biliary or urinary calculi are evident.  IMPRESSION: Generous volume colonic stool and gas without evidence of obstruction or perforation.   Electronically Signed   By: Andreas Newport M.D.   On: 07/31/2014 02:17   Scheduled Meds: . aspirin  81 mg Oral Daily  .  atorvastatin  80 mg Oral Daily  . benztropine  0.5 mg Oral BID  . clopidogrel  75 mg Oral Daily  . vitamin B-12  500 mcg Oral Daily  . divalproex  500 mg Oral BID  . enoxaparin (LOVENOX) injection  40 mg Subcutaneous Daily  . feeding supplement (ENSURE ENLIVE)  237 mL Oral BID BM  . folic acid  1 mg Oral Daily  . [START ON 08/27/2014] haloperidol decanoate  100 mg Intramuscular Q28 days  . levETIRAcetam  1,000 mg Oral BID  . multivitamin with minerals  1 tablet Oral Daily  . nicotine  21 mg Transdermal Daily  . QUEtiapine  50 mg Oral QHS  . thiamine  100 mg Oral Daily   Or  . thiamine  100 mg Intravenous Daily   Continuous Infusions: . sodium chloride 125 mL/hr at 08/01/14 0919   PRN Meds:.acetaminophen **OR** acetaminophen, hydrALAZINE, LORazepam **OR** LORazepam, morphine injection, ondansetron **OR** ondansetron (ZOFRAN) IV, oxyCODONE, traZODone   ASSESMENT:   * Recurrent, ongoing acute  pancreatitis. PD dilation and stricture and CBD dilation on MRCP last week.  * Schizophrenia. Leaves AMA all the time. Wonder if he is taking his psych meds or any of his other meds?   * Alcoholism. ETOH level < 5. tox screen negative. Hep B and C, HIV negative.   *  Chronic plavix.  Not on hold.  Is this for the 07/2013 cardiac stent?  If so seems it could be discontinued. Dr Terrence Dupont did procedure, no op note but op record indicates Xience stent.    PLAN   *  EUS/ERCP after pancreatitis resolved for at least 2 to 3 weeks. No plans set pt up for outpt follow up at our office as he will not show up. If/when he returns to his PMD for and repeat CT demonstrates resolution of acute pancreatitis, can be referred to GI covering unassigned care. Most likely he will end up back at hospital before that.   *  Needs to abstain from ETOH.    *  ? Need for plavix? This needs to be held for any procedures, also has been 12 months since DES placed.   *  Tolerated fulls for lunch, I advanced to low fat diet.     Azucena Freed  08/01/2014, 10:45 AM Pager: [phone]     Attending physician's note   I have taken an interval history, reviewed the chart and examined the patient. I agree with the Advanced Practitioner's note, impression and recommendations.   Pricilla Riffle. Fuller Plan, MD [facility]

## 2014-08-01 NOTE — Progress Notes (Signed)
Pt became more and more insistent on leaving AMA despite efforts to persuade him to stay and to outline health consequences for leaving. MD paged twice with text notifications just ahead of pt leaving, and brother was called. Pt was escorted to the [place] entrance from which he planned to "walk home". Brother has been notified that the pt left. Pt sign AMA form before leaving.

## 2014-08-01 NOTE — Consult Note (Signed)
[facility] Face-to-Face Psychiatry Consult   Reason for Consult:  Capacity evaluation Referring Physician:  Dr. Grandville Silos  Patient Identification: Chase Blackwell MRN:  [id number] Principal Diagnosis: Paranoid schizophrenia, chronic condition Diagnosis:   Patient Active Problem List   Diagnosis Date Noted  . Common bile duct (CBD) obstruction [K83.1]   . Common bile duct obstruction [K83.1] 07/18/2014  . Lice infestation [N56.2] 07/18/2014  . Choledocholithiasis with obstruction [K80.51] 07/18/2014  . Dehydration [E86.0] 07/18/2014  . Elevated LFTs [R79.89]   . Alcohol-induced acute pancreatitis [K85.2]   . Convulsion [R56.9]   . Abdominal pain, epigastric [R10.13] 07/01/2014  . Anemia [D64.9] 07/01/2014  . Maxillary sinusitis, acute [J01.00] 06/11/2014  . Acute non-Q-wave MI <8 weeks prior, subsequent admission after initial [I22.9, I21.4] 06/11/2014  . Sinusitis, acute maxillary [J01.00] 06/10/2014  . Seizure disorder [G40.909] 06/09/2014  . Hyponatremia [E87.1]   . Chest pain [R07.9] 06/07/2014  . Protein calorie malnutrition [E46] 06/07/2014  . Pain in the chest [R07.9]   . Schizophrenia [F20.9]   . Pancreatitis [K85.9] 01/22/2014  . CHF (congestive heart failure) [I50.9] 01/17/2014  . Tobacco abuse [Z72.0] 01/17/2014  . 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 CAD (coronary artery disease) [I25.10] 01/16/2014  . Acute pancreatitis [K85.9] 01/15/2014  . Anoxic brain injury [G93.1] 09/01/2013  . Seizures [R56.9] 09/01/2013  . Acute tracheobronchitis [J00] 09/01/2013  . PEG (percutaneous endoscopic gastrostomy) status [Z93.1] 09/01/2013  . Altered mental status [R41.82] 07/09/2013  . Iatrogenic pneumothorax [J95.811] 07/07/2013  . Cardiac arrest [I46.9] 07/06/2013  . ST elevation myocardial infarction (STEMI) of inferior wall, initial episode of care [I21.19] 07/06/2013  . Acute respiratory failure [J96.00] 07/06/2013  . HYPERCHOLESTEROLEMIA  [E78.0] 06/05/2008  . 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 Alcohol abuse [F10.10] 06/05/2008  . CANNABIS ABUSE [F12.10] 06/05/2008  . DEPRESSION [F32.9] 06/05/2008  . Essential hypertension [I10] 06/05/2008  . BRONCHITIS [J40] 06/05/2008  . RECTAL BLEEDING [K62.5] 06/05/2008    Total Time spent with patient: 1 hour  Subjective:   Chase Blackwell is a 49 y.o. male patient admitted with acute pancreatitis.   HPI:  Chase Blackwell is a 49 y.o. male seen face-to-face for psychiatric consultation and evaluation of capacity because patient has been having a hard time understanding his current medical problems and needed treatment. Patient has a chronic alcohol abuse versus dependence and also Alcoholic pancreatitis. Patient has significant cognitive deficits and unable to respond to most of the questions related orientation, concentration, immediate and delayed memory and language functions. Most of the questions see onset I do not know. Patient knows that he is in the hospital and problems with the gallbladder and recommended treatment is surgery. Patient does not have capacity to understand comprehensive treatment care needed. Patient denied symptoms of depression, anxiety, mania, psychosis and suicidal/homicidal ideation, intention or plans. Patient valproic acid is less than therapeutic and questionable compliance with his medication management for medical conditions and mental health conditions. Patient has no family members at bedside. Patient family will be contacted by social service if needed collateral information.   Past Medical History:  Past Medical History  Diagnosis Date  . Hypertension   . Asthma   . GERD (gastroesophageal reflux disease)   . Chronic pain   . Schizophrenia   . Alcohol abuse   . Seizures   . Pancreatitis   . Depression   . CAD (coronary artery disease)     Past Surgical History  Procedure Laterality Date  .  Left heart catheterization with  coronary angiogram N/A 07/06/2013    Procedure: LEFT HEART CATHETERIZATION WITH CORONARY ANGIOGRAM;  Surgeon: Clent Demark, MD;  Location: [facility] CATH LAB;  Service: Cardiovascular;  Laterality: N/A;  . Percutaneous coronary stent intervention (pci-s)  07/06/2013    Procedure: PERCUTANEOUS CORONARY STENT INTERVENTION (PCI-S);  Surgeon: Clent Demark, MD;  Location: [facility] CATH LAB;  Service: Cardiovascular;;   Family History:  Family History  Problem Relation Age of Onset  . Malignant hyperthermia Mother   . Cirrhosis Father   . Alcohol abuse Father    Social History:  History  Alcohol Use  . Yes    Comment: daily heavy drinker     History  Drug Use  . Yes  . Special: Marijuana    History   Social History  . Marital Status: Single    Spouse Name: N/A  . Number of Children: N/A  . Years of Education: N/A   Social History Main Topics  . Smoking status: Current Every Day Smoker -- 0.00 packs/day    Types: Cigarettes  . Smokeless tobacco: Not on file  . Alcohol Use: Yes     Comment: daily heavy drinker  . Drug Use: Yes    Special: Marijuana  . Sexual Activity: Not on file   Other Topics Concern  . None   Social History Narrative   Additional Social History:                          Allergies:   Allergies  Allergen Reactions  . Hctz [Hydrochlorothiazide] Other (See Comments)    Dizzy spells  . Hydroxyzine Hives  . Sulfonamide Derivatives Hives  . Cetirizine & Related Other (See Comments)    Brothers were not aware of this allergy    Labs:  Results for orders placed or performed during the hospital encounter of 07/30/14 (from the past 48 hour(s))  Lipase, blood     Status: Abnormal   Collection Time: 07/30/14  7:38 PM  Result Value Ref Range   Lipase 644 (H) 22 - 51 U/L    Comment: RESULTS CONFIRMED BY MANUAL DILUTION  Comprehensive metabolic panel     Status: Abnormal   Collection Time: 07/30/14  7:38 PM  Result Value Ref Range   Sodium 138 135 -  145 mmol/L   Potassium 3.6 3.5 - 5.1 mmol/L   Chloride 98 (L) 101 - 111 mmol/L   CO2 28 22 - 32 mmol/L   Glucose, Bld 133 (H) 65 - 99 mg/dL   BUN <5 (L) 6 - 20 mg/dL   Creatinine, Ser 0.82 0.61 - 1.24 mg/dL   Calcium 9.0 8.9 - 10.3 mg/dL   Total Protein 6.6 6.5 - 8.1 g/dL   Albumin 3.6 3.5 - 5.0 g/dL   AST 26 15 - 41 U/L   ALT 50 17 - 63 U/L   Alkaline Phosphatase 226 (H) 38 - 126 U/L   Total Bilirubin 0.5 0.3 - 1.2 mg/dL   GFR calc non Af Amer >60 >60 mL/min   GFR calc Af Amer >60 >60 mL/min    Comment: (NOTE) The eGFR has been calculated using the CKD EPI equation. This calculation has not been validated in all clinical situations. eGFR's persistently <60 mL/min signify possible Chronic Kidney Disease.    Anion gap 12 5 - 15  CBC     Status: Abnormal   Collection Time: 07/30/14  7:38 PM  Result Value Ref  Range   WBC 7.4 4.0 - 10.5 K/uL   RBC 3.84 (L) 4.22 - 5.81 MIL/uL   Hemoglobin 12.3 (L) 13.0 - 17.0 g/dL   HCT 36.3 (L) 39.0 - 52.0 %   MCV 94.5 78.0 - 100.0 fL   MCH 32.0 26.0 - 34.0 pg   MCHC 33.9 30.0 - 36.0 g/dL   RDW 14.0 11.5 - 15.5 %   Platelets 306 150 - 400 K/uL  Urinalysis, Routine w reflex microscopic (not at [facility])     Status: None   Collection Time: 07/30/14  7:38 PM  Result Value Ref Range   Color, Urine YELLOW YELLOW   APPearance CLEAR CLEAR   Specific Gravity, Urine 1.009 1.005 - 1.030   pH 6.5 5.0 - 8.0   Glucose, UA NEGATIVE NEGATIVE mg/dL   Hgb urine dipstick NEGATIVE NEGATIVE   Bilirubin Urine NEGATIVE NEGATIVE   Ketones, ur NEGATIVE NEGATIVE mg/dL   Protein, ur NEGATIVE NEGATIVE mg/dL   Urobilinogen, UA 1.0 0.0 - 1.0 mg/dL   Nitrite NEGATIVE NEGATIVE   Leukocytes, UA NEGATIVE NEGATIVE    Comment: MICROSCOPIC NOT DONE ON URINES WITH NEGATIVE PROTEIN, BLOOD, LEUKOCYTES, NITRITE, OR GLUCOSE <1000 mg/dL.  Ammonia     Status: None   Collection Time: 07/31/14 12:12 AM  Result Value Ref Range   Ammonia 27 9 - 35 umol/L  Troponin I     Status: None    Collection Time: 07/31/14 12:12 AM  Result Value Ref Range   Troponin I <0.03 <0.031 ng/mL    Comment:        NO INDICATION OF MYOCARDIAL INJURY.   Ethanol     Status: None   Collection Time: 07/31/14  2:45 AM  Result Value Ref Range   Alcohol, Ethyl (B) <5 <5 mg/dL    Comment:        LOWEST DETECTABLE LIMIT FOR SERUM ALCOHOL IS 5 mg/dL FOR MEDICAL PURPOSES ONLY   Hepatic function panel     Status: Abnormal   Collection Time: 07/31/14  2:45 AM  Result Value Ref Range   Total Protein 6.5 6.5 - 8.1 g/dL   Albumin 3.5 3.5 - 5.0 g/dL   AST 23 15 - 41 U/L   ALT 44 17 - 63 U/L   Alkaline Phosphatase 210 (H) 38 - 126 U/L   Total Bilirubin 0.4 0.3 - 1.2 mg/dL   Bilirubin, Direct 0.1 0.1 - 0.5 mg/dL   Indirect Bilirubin 0.3 0.3 - 0.9 mg/dL  Lipase, blood     Status: Abnormal   Collection Time: 07/31/14  2:45 AM  Result Value Ref Range   Lipase 397 (H) 22 - 51 U/L  Troponin I     Status: None   Collection Time: 07/31/14  2:45 AM  Result Value Ref Range   Troponin I <0.03 <0.031 ng/mL    Comment:        NO INDICATION OF MYOCARDIAL INJURY.   CBC     Status: Abnormal   Collection Time: 07/31/14  2:45 AM  Result Value Ref Range   WBC 6.6 4.0 - 10.5 K/uL   RBC 3.69 (L) 4.22 - 5.81 MIL/uL   Hemoglobin 11.7 (L) 13.0 - 17.0 g/dL   HCT 35.3 (L) 39.0 - 52.0 %   MCV 95.7 78.0 - 100.0 fL   MCH 31.7 26.0 - 34.0 pg   MCHC 33.1 30.0 - 36.0 g/dL   RDW 14.0 11.5 - 15.5 %   Platelets 264 150 - 400 K/uL  Creatinine,  serum     Status: None   Collection Time: 07/31/14  2:45 AM  Result Value Ref Range   Creatinine, Ser 0.61 0.61 - 1.24 mg/dL   GFR calc non Af Amer >60 >60 mL/min   GFR calc Af Amer >60 >60 mL/min    Comment: (NOTE) The eGFR has been calculated using the CKD EPI equation. This calculation has not been validated in all clinical situations. eGFR's persistently <60 mL/min signify possible Chronic Kidney Disease.   Valproic acid level     Status: Abnormal   Collection  Time: 07/31/14  2:45 AM  Result Value Ref Range   Valproic Acid Lvl 28 (L) 50.0 - 100.0 ug/mL  Lactic acid, plasma     Status: None   Collection Time: 07/31/14  4:03 AM  Result Value Ref Range   Lactic Acid, Venous 0.5 0.5 - 2.0 mmol/L  Glucose, capillary     Status: None   Collection Time: 07/31/14  6:46 AM  Result Value Ref Range   Glucose-Capillary 79 65 - 99 mg/dL  Glucose, capillary     Status: None   Collection Time: 07/31/14  9:16 AM  Result Value Ref Range   Glucose-Capillary 83 65 - 99 mg/dL  Glucose, capillary     Status: Abnormal   Collection Time: 07/31/14 12:49 PM  Result Value Ref Range   Glucose-Capillary 114 (H) 65 - 99 mg/dL  Glucose, capillary     Status: None   Collection Time: 07/31/14  5:23 PM  Result Value Ref Range   Glucose-Capillary 68 65 - 99 mg/dL  Glucose, capillary     Status: None   Collection Time: 07/31/14  5:50 PM  Result Value Ref Range   Glucose-Capillary 76 65 - 99 mg/dL  Glucose, capillary     Status: None   Collection Time: 08/01/14 12:39 AM  Result Value Ref Range   Glucose-Capillary 87 65 - 99 mg/dL  CBC     Status: Abnormal   Collection Time: 08/01/14  5:15 AM  Result Value Ref Range   WBC 4.6 4.0 - 10.5 K/uL   RBC 3.60 (L) 4.22 - 5.81 MIL/uL   Hemoglobin 11.5 (L) 13.0 - 17.0 g/dL   HCT 34.6 (L) 39.0 - 52.0 %   MCV 96.1 78.0 - 100.0 fL   MCH 31.9 26.0 - 34.0 pg   MCHC 33.2 30.0 - 36.0 g/dL   RDW 13.9 11.5 - 15.5 %   Platelets 233 150 - 400 K/uL  Comprehensive metabolic panel     Status: Abnormal   Collection Time: 08/01/14  5:15 AM  Result Value Ref Range   Sodium 142 135 - 145 mmol/L   Potassium 3.8 3.5 - 5.1 mmol/L   Chloride 110 101 - 111 mmol/L   CO2 26 22 - 32 mmol/L   Glucose, Bld 79 65 - 99 mg/dL   BUN <5 (L) 6 - 20 mg/dL   Creatinine, Ser 0.55 (L) 0.61 - 1.24 mg/dL   Calcium 8.5 (L) 8.9 - 10.3 mg/dL   Total Protein 5.5 (L) 6.5 - 8.1 g/dL   Albumin 3.0 (L) 3.5 - 5.0 g/dL   AST 25 15 - 41 U/L   ALT 42 17 - 63 U/L    Alkaline Phosphatase 213 (H) 38 - 126 U/L   Total Bilirubin 0.5 0.3 - 1.2 mg/dL   GFR calc non Af Amer >60 >60 mL/min   GFR calc Af Amer >60 >60 mL/min    Comment: (NOTE) The eGFR has  been calculated using the CKD EPI equation. This calculation has not been validated in all clinical situations. eGFR's persistently <60 mL/min signify possible Chronic Kidney Disease.    Anion gap 6 5 - 15  Lipase, blood     Status: Abnormal   Collection Time: 08/01/14  5:15 AM  Result Value Ref Range   Lipase 162 (H) 22 - 51 U/L  Glucose, capillary     Status: None   Collection Time: 08/01/14  6:03 AM  Result Value Ref Range   Glucose-Capillary 92 65 - 99 mg/dL  Glucose, capillary     Status: None   Collection Time: 08/01/14  7:55 AM  Result Value Ref Range   Glucose-Capillary 82 65 - 99 mg/dL    Vitals: Blood pressure 132/88, pulse 71, temperature 97.6 F (36.4 C), temperature source Oral, resp. rate 20, height 6' (1.829 m), weight 62.6 kg (138 lb 0.1 oz), SpO2 98 %.  Risk to Self: Is patient at risk for suicide?: No Risk to Others:   Prior Inpatient Therapy:   Prior Outpatient Therapy:    Current Facility-Administered Medications  Medication Dose Route Frequency Provider Last Rate Last Dose  . 0.9 %  sodium chloride infusion   Intravenous Continuous Eugenie Filler, MD 125 mL/hr at 08/01/14 0919    . acetaminophen (TYLENOL) tablet 650 mg  650 mg Oral Q6H PRN Rise Patience, MD       Or  . acetaminophen (TYLENOL) suppository 650 mg  650 mg Rectal Q6H PRN Rise Patience, MD      . aspirin chewable tablet 81 mg  81 mg Oral Daily Rise Patience, MD   81 mg at 07/31/14 0949  . atorvastatin (LIPITOR) tablet 80 mg  80 mg Oral Daily Rise Patience, MD   80 mg at 07/31/14 0949  . benztropine (COGENTIN) tablet 0.5 mg  0.5 mg Oral BID Rise Patience, MD   0.5 mg at 07/31/14 2239  . clopidogrel (PLAVIX) tablet 75 mg  75 mg Oral Daily Rise Patience, MD   75 mg at  07/31/14 0949  . cyanocobalamin tablet 500 mcg  500 mcg Oral Daily Rise Patience, MD   500 mcg at 07/31/14 0949  . divalproex (DEPAKOTE) DR tablet 500 mg  500 mg Oral BID Rise Patience, MD   500 mg at 07/31/14 2239  . enoxaparin (LOVENOX) injection 40 mg  40 mg Subcutaneous Daily Rise Patience, MD   40 mg at 07/31/14 0949  . feeding supplement (ENSURE ENLIVE) (ENSURE ENLIVE) liquid 237 mL  237 mL Oral BID BM Rise Patience, MD   237 mL at 07/31/14 1500  . folic acid (FOLVITE) tablet 1 mg  1 mg Oral Daily Rise Patience, MD   1 mg at 07/31/14 0949  . [START ON 08/27/2014] haloperidol decanoate (HALDOL DECANOATE) 100 MG/ML injection 100 mg  100 mg Intramuscular Q28 days Rise Patience, MD      . hydrALAZINE (APRESOLINE) injection 10 mg  10 mg Intravenous Q4H PRN Rise Patience, MD      . levETIRAcetam (KEPPRA) tablet 1,000 mg  1,000 mg Oral BID Rise Patience, MD   1,000 mg at 07/31/14 2238  . LORazepam (ATIVAN) tablet 1 mg  1 mg Oral Q6H PRN Rise Patience, MD   1 mg at 07/31/14 1259   Or  . LORazepam (ATIVAN) injection 1 mg  1 mg Intravenous Q6H PRN Rise Patience, MD      .  morphine 2 MG/ML injection 2 mg  2 mg Intravenous Q4H PRN Eugenie Filler, MD   2 mg at 08/01/14 0919  . multivitamin with minerals tablet 1 tablet  1 tablet Oral Daily Rise Patience, MD   1 tablet at 07/31/14 0949  . nicotine (NICODERM CQ - dosed in mg/24 hours) patch 21 mg  21 mg Transdermal Daily Eugenie Filler, MD   21 mg at 07/31/14 1749  . ondansetron (ZOFRAN) tablet 4 mg  4 mg Oral Q6H PRN Rise Patience, MD       Or  . ondansetron [facility]) injection 4 mg  4 mg Intravenous Q6H PRN Rise Patience, MD      . oxyCODONE (Oxy IR/ROXICODONE) immediate release tablet 5 mg  5 mg Oral Q4H PRN Eugenie Filler, MD      . QUEtiapine (SEROQUEL) tablet 50 mg  50 mg Oral QHS Rise Patience, MD   50 mg at 07/31/14 2239  . thiamine (VITAMIN B-1) tablet 100  mg  100 mg Oral Daily Rise Patience, MD   100 mg at 07/31/14 7846   Or  . thiamine (B-1) injection 100 mg  100 mg Intravenous Daily Rise Patience, MD      . traZODone (DESYREL) tablet 100 mg  100 mg Oral QHS PRN Rise Patience, MD        Musculoskeletal: Strength & Muscle Tone: decreased Gait & Station: normal Patient leans: N/A  Psychiatric Specialty Exam: Physical Examas per history and physical  ROS abdominal pain, forgetfulness, memory difficulties, denied nausea, vomiting, chest pain and shortness of breath or dizziness. No Fever-chills, No Headache, No changes with Vision or hearing, reports vertigo No problems swallowing food or Liquids, No Chest pain, Cough or Shortness of Breath, No Abdominal pain, No Nausea or Vommitting, Bowel movements are regular, No Blood in stool or Urine, No dysuria, No new skin rashes or bruises, No new joints pains-aches,  No new weakness, tingling, numbness in any extremity, No recent weight gain or loss, No polyuria, polydypsia or polyphagia,   A full 10 point Review of Systems was done, except as stated above, all other Review of Systems were negative.  Blood pressure 132/88, pulse 71, temperature 97.6 F (36.4 C), temperature source Oral, resp. rate 20, height 6' (1.829 m), weight 62.6 kg (138 lb 0.1 oz), SpO2 98 %.Body mass index is 18.71 kg/(m^2).  General Appearance: Disheveled and Guarded  Eye Contact::  Good  Speech:  Blocked and Slow  Volume:  Decreased  Mood:  Depressed  Affect:  Blunt and Depressed  Thought Process:  Irrelevant and Loose  Orientation:  Other:  Oriented to his name and knows he is in the hospital  Thought Content:  Rumination  Suicidal Thoughts:  No  Homicidal Thoughts:  No  Memory:  Immediate;   Poor Recent;   Poor  Judgement:  Impaired  Insight:  Lacking  Psychomotor Activity:  Decreased  Concentration:  Poor  Recall:  Poor  Fund of Knowledge:Poor  Language: Fair  Akathisia:  Negative   Handed:  Right  AIMS (if indicated):     Assets:  Communication Skills Desire for Improvement Leisure Time Resilience  ADL's:  Intact  Cognition: Impaired,  Moderate  Sleep:      Medical Decision Making: Review of Psycho-Social Stressors (1), Review or order clinical lab tests (1), Established Problem, Worsening (2), Review of Last Therapy Session (1), Review or order medicine tests (1), Review of Medication Regimen &  Side Effects (2) and Review of New Medication or Change in Dosage (2)  Treatment Plan Summary: Daily contact with patient to assess and evaluate symptoms and progress in treatment and Medication management  Plan: Patient does not meet criteria for psychiatric inpatient admission. Supportive therapy provided about ongoing stressors.  Patient does not meet criteria for capacity to make his own medical decisions and as he cannot understand complicated medical treatment and procedures secondary to significant deterioration in his cognitive function as per my evaluation today  Disposition: Patient will be referred to the psychiatric social service if needed medical care power of attorney.  Nyriah Coote,JANARDHAHA R. 08/01/2014 9:48 AM

## 2014-08-01 NOTE — Discharge Summary (Signed)
Physician Discharge Summary  Chase Blackwell OMV:[id number] DOB: December 31, 1965 DOA: 07/30/2014  PCP: [facility]  Admit date: 07/30/2014 Discharge date: 08/01/2014                             PATIENT LEFT AMA Time spent: 60 minutes  Recommendations for Outpatient Follow-up:  1. Patient left AGAINST MEDICAL ADVICE. Hopefully patient will follow-up with PCP as outpatient. If patient follows up with PCP as outpatient and has improved clinically in terms of his acute pancreatitis he would need a repeat CT scan to demonstrate resolution of acute pancreatitis per GI recommendations. Patient will also need referral to outpatient GI for further evaluation and possible EUS/ERCP 2-3 weeks post resolution of pancreatitis.  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 Pain was across his abdomen denied any associated nausea, vomiting, diarrhea. Patient stated he had not been eating well. Denied any chest pain shortness of breath or fever chills. Lipase was found to be elevated with chest x-ray and KUB not showing anything acute except for large amount of  stools.    Hospital Course:  #1 acute pancreatitis Patient was admitted with acute pancreatitis which had been ongoing since last admission when patient left AMA. MRCP done was done at that time showed pancreatic ductal dilatation and stricture as well as, bile duct dilatation. Patient was admitted placed on bowel rest placed on IV fluids, pain management and supportive care. GI consultation was obtained and it was recommended that patient's acute pancreatitis be resolved and patient subsequently will likely need a EUS/ERCP for further evaluation and management. Patient improved clinically and his diet was subsequently advanced to a full liquid diet which he tolerated. Patient was seen by GI who further advance his diet to a low-fat diet to observe. Patient however left AMA prior to his diet being advanced to a low-fat diet. Per GIs note it was recommended that patient will likely need a EUS/ERCP at least 2-3 weeks post resolution of pancreatitis. It was also felt that patient will need a repeat CT of his abdomen and pelvis to demonstrate resolution of acute pancreatitis which could be done as outpatient with further referral to GI for further management. Patient however left AGAINST MEDICAL ADVICE prior to his diabetes and advanced to a low-fat diet and further management of his pancreatitis. Hopefully patient will follow-up with PCP as outpatient.  #2 alcohol abuse Alcohol cessation was stressed to the patient. Patient was placed on the Ativan withdrawal protocol throughout the hospitalization. Patient did not experience any DVTs. Patient left AGAINST MEDICAL ADVICE.  #3  bed bug infestation Patient was placed on contact precautions.  #4 schizophrenia bipolar disorder Patient was maintained on his home regimen of medications. Patient was seen in consultation by psychiatry and felt patient lacked capacity.  #5 bradycardia Beta blockers were held during the hospitalization. Patient remained  asymptomatic. Patient was monitored on telemetry.  #6 history of seizures Patient was maintained on home regimen of Keppra. Patient did not have any seizures during the hospitalization. Outpatient follow-up.  Procedures:  Abdominal x-ray 07/31/2014  Chest x-ray 07/31/2014  Consultations:  Gastroenterology: Dr. Fuller Plan 07/31/2014  Psychiatry: Dr. Louretta Shorten 08/01/2014  Discharge Exam: Filed Vitals:   08/01/14 1255  BP: 147/84  Pulse: 54  Temp: 97.6 F (36.4 C)  Resp: 16    General: NAD Cardiovascular: RRR Respiratory: CTAB  Discharge Instructions    Discharge Medication List as of 08/01/2014  4:00 PM    CONTINUE these medications which have NOT CHANGED   Details  aspirin 81 MG chewable tablet Chew 1 tablet (81 mg total) by mouth daily., Starting 11/23/2013, Until Discontinued, Print    atorvastatin (LIPITOR) 80 MG tablet Take 1 tablet (80 mg total) by mouth daily., Starting 02/13/2014, Until Discontinued, Print    benztropine (COGENTIN) 0.5 MG tablet Take 0.5 mg by mouth 2 (two) times daily., Until Discontinued, Historical Med    clopidogrel (PLAVIX) 75 MG tablet Take 75 mg by mouth daily., Starting 06/11/2014, Until Discontinued, Historical Med    divalproex (DEPAKOTE) 500 MG DR tablet Take 1 tablet (500 mg total) by mouth 2 (two) times daily., Starting 02/13/2014, Until Discontinued, Print    feeding supplement, ENSURE ENLIVE, (ENSURE ENLIVE) LIQD Take 237 mLs by mouth 2 (two) times daily between meals., Starting 06/11/2014, Until Discontinued, No Print    folic acid (FOLVITE) 1 MG tablet Take 1 tablet (1 mg total) by mouth daily., Starting 06/11/2014, Until Discontinued, No Print    furosemide (LASIX) 40 MG tablet Take 40 mg by mouth daily., Starting 07/18/2014, Until Discontinued, Historical Med    haloperidol decanoate (HALDOL DECANOATE) 100 MG/ML injection Inject 1 mL into the muscle every 28 (twenty-eight) days., Starting 05/24/2014, Until Discontinued, Historical  Med    hydrALAZINE (APRESOLINE) 10 MG tablet Take 1 tablet (10 mg total) by mouth 2 (two) times daily., Starting 02/13/2014, Until Discontinued, Print    !! levETIRAcetam (KEPPRA) 1000 MG tablet Take 1,000 mg by mouth 2 (two) times daily., Until Discontinued, Historical Med    lisinopril (PRINIVIL,ZESTRIL) 2.5 MG tablet Take 2.5 mg by mouth daily., Until Discontinued, Historical Med    LORazepam (ATIVAN) 0.5 MG tablet Take 0.5 mg by mouth at bedtime as needed for anxiety., Until Discontinued, Historical Med    meloxicam (MOBIC) 15 MG tablet Take 15 mg by mouth daily., Until Discontinued, Historical Med    metoprolol tartrate (LOPRESSOR) 25 MG tablet Take 0.5 tablets (12.5 mg total) by mouth 2 (two) times daily., Starting 02/13/2014, Until Discontinued, Print    Multiple Vitamin (MULTIVITAMIN WITH MINERALS) TABS tablet Take 1 tablet by mouth daily., Starting 06/11/2014, Until Discontinued, OTC    QUEtiapine (SEROQUEL) 50 MG tablet Take 1 tablet (50 mg total) by mouth at bedtime., Starting 02/13/2014, Until Discontinued, Print    thiamine 100 MG tablet Take 1 tablet (100 mg total) by mouth daily., Starting 06/11/2014, Until Discontinued, OTC    traZODone (DESYREL) 100 MG tablet Take 1 tablet by mouth at bedtime as needed for sleep. , Starting 05/13/2014, Until Discontinued, Historical Med    vitamin B-12 500 MCG tablet Take  1 tablet (500 mcg total) by mouth daily., Starting 07/02/2014, Until Discontinued, Normal    !! levETIRAcetam (KEPPRA) 250 MG tablet Take 5 tablets (1,250 mg total) by mouth 2 (two) times daily., Starting 06/18/2014, Until Discontinued, Print     !! - Potential duplicate medications found. Please discuss with provider.     Allergies  Allergen Reactions  . Hctz [Hydrochlorothiazide] Other (See Comments)    Dizzy spells  . Hydroxyzine Hives  . Sulfonamide Derivatives Hives  . Cetirizine & Related Other (See Comments)    Brothers were not aware of this allergy      The  results of significant diagnostics from this hospitalization (including imaging, microbiology, ancillary and laboratory) are listed below for reference.    Significant Diagnostic Studies: Dg Chest 2 View  07/18/2014   CLINICAL DATA:  Mid chest pain  EXAM: CHEST - 2 VIEW  COMPARISON:  None.  FINDINGS: The heart size and mediastinal contours are within normal limits. Both lungs are clear. The visualized skeletal structures are unremarkable.  IMPRESSION: No active disease.   Electronically Signed   By: Inez Catalina M.D.   On: 07/18/2014 12:26   US Abdomen Complete  07/18/2014   CLINICAL DATA:  Chronic right upper quadrant abdominal pain.  EXAM: ULTRASOUND ABDOMEN COMPLETE  COMPARISON:  CT scan of January 12, 2014.  FINDINGS: Gallbladder: No wall thickening visualized. No sonographic Murphy sign noted. Non shadowing non mobile echogenic focus measuring 5 mm is noted near gallbladder fundus concerning for adherent stone or polyp. Mild amount of sludge is noted.  Common bile duct: Diameter: 18 mm proximally concerning for distal obstruction. No definite calculus is noted in the visualized portion of the duct.  Liver: Mild intrahepatic ductal dilatation is noted. No other focal abnormality is noted. Echogenicity of parenchyma is within normal limits.  IVC: No abnormality visualized.  Pancreas: Visualized portion appears normal. Pancreatic tail not visualized due to overlying bowel gas.  Spleen: Size and appearance within normal limits.  Right Kidney: Length: 10.4 cm. Echogenicity within normal limits. No mass or hydronephrosis visualized.  Left Kidney: Length: 12.4 cm. Echogenicity within normal limits. No mass or hydronephrosis visualized.  Abdominal aorta: No aneurysm visualized.  Other findings: None.  IMPRESSION: Small adherent gallstone or polyp is noted toward the fundus of the gallbladder. Minimal amount of sludge is noted within the gallbladder lumen. No gallbladder wall thickening, pericholecystic fluid or  sonographic Murphy's sign is noted.  Severely dilated common bile duct is noted as well as mild intrahepatic biliary ductal dilatation. This is concerning for distal common bile duct obstruction and further evaluation with MRCP is recommended.   Electronically Signed   By: Marijo Conception, M.D.   On: 07/18/2014 12:48   Mr 3d Recon At Scanner  07/24/2014   CLINICAL DATA:  Abdominal pain, acute pancreatitis with transaminitis, dilated CBD on ultrasound.  EXAM: MRI ABDOMEN WITHOUT AND WITH CONTRAST (INCLUDING MRCP)  TECHNIQUE: Multiplanar multisequence MR imaging of the abdomen was performed both before and after the administration of intravenous contrast. Heavily T2-weighted images of the biliary and pancreatic ducts were obtained, and three-dimensional MRCP images were rendered by post processing.  CONTRAST:  40mL MULTIHANCE GADOBENATE DIMEGLUMINE 529 MG/ML IV SOLN  COMPARISON:  Abdominal ultrasound dated 07/18/2014. CT abdomen pelvis dated 01/12/2014.  FINDINGS: Motion degraded images.  Lower chest:  Lung bases are clear.  Hepatobiliary: Liver is within normal limits. No suspicious/enhancing hepatic lesions. No hepatic steatosis.  Mildly distended gallbladder with layering tiny gallstones/sludge (  series 11/ image 33). No associated inflammatory changes.  Mild central intrahepatic ductal prominence. Common duct is dilated, measuring 20 mm, and abruptly tapers at the ampulla. No choledocholithiasis is seen.  Pancreas: Mild peripancreatic inflammatory changes with fluid/stranding along the distal pancreatic body/tail. No drainable fluid collection/pseudocyst.  Irregular dilatation of the pancreatic duct which abruptly tapers in the region of the pancreatic head (double duct sign). No definite pancreatic head mass is visualized, noting motion degradation.  Spleen: Within normal limits.  Adrenals/Urinary Tract: Adrenal glands are within normal limits.  Kidneys are within normal limits.  No hydronephrosis.   Stomach/Bowel: Stomach and visualized bowel are grossly unremarkable.  Vascular/Lymphatic: No evidence of abdominal aortic aneurysm.  No suspicious abdominal lymphadenopathy.  Other: Small volume fluid in the left mid abdomen.  Musculoskeletal: No focal osseous lesions.  IMPRESSION: Motion degraded images.  Acute pancreatitis with fluid/stranding along the distal pancreatic body/tail. No drainable fluid collection/pseudocyst.  Dilated common duct, measuring 20 mm, abruptly tapering at the ampulla. No choledocholithiasis is seen.  Associated irregular dilatation of the pancreatic duct which abruptly tapers in the region of the pancreatic head (double duct sign). While no definite pancreatic head mass is visualized on the current study, follow-up EUS or repeat MRI abdomen with/without contrast in 3-4 weeks to exclude an occult pancreatic head neoplasm.  Cholelithiasis, without associated inflammatory changes to suggest acute cholecystitis.   Electronically Signed   By: Julian Hy M.D.   On: 07/24/2014 16:17   Dg Chest Port 1 View  07/31/2014   CLINICAL DATA:  Abdominal pain and hypotension  EXAM: PORTABLE CHEST - 1 VIEW  COMPARISON:  None.  FINDINGS: The heart size and mediastinal contours are within normal limits. Both lungs are clear. The visualized skeletal structures are unremarkable.  IMPRESSION: No active disease.   Electronically Signed   By: Andreas Newport M.D.   On: 07/31/2014 02:18   Dg Abd Portable 1v  07/31/2014   CLINICAL DATA:  Abdominal pain.  Hypotension.  EXAM: PORTABLE ABDOMEN - 1 VIEW  COMPARISON:  None.  FINDINGS: There is a generous volume of stool and air throughout the colon. There is no radiographic evidence of bowel obstruction or perforation. No biliary or urinary calculi are evident.  IMPRESSION: Generous volume colonic stool and gas without evidence of obstruction or perforation.   Electronically Signed   By: Andreas Newport M.D.   On: 07/31/2014 02:17   Mr Jeananne Rama W/wo  Cm/mrcp  07/24/2014   CLINICAL DATA:  Abdominal pain, acute pancreatitis with transaminitis, dilated CBD on ultrasound.  EXAM: MRI ABDOMEN WITHOUT AND WITH CONTRAST (INCLUDING MRCP)  TECHNIQUE: Multiplanar multisequence MR imaging of the abdomen was performed both before and after the administration of intravenous contrast. Heavily T2-weighted images of the biliary and pancreatic ducts were obtained, and three-dimensional MRCP images were rendered by post processing.  CONTRAST:  46mL MULTIHANCE GADOBENATE DIMEGLUMINE 529 MG/ML IV SOLN  COMPARISON:  Abdominal ultrasound dated 07/18/2014. CT abdomen pelvis dated 01/12/2014.  FINDINGS: Motion degraded images.  Lower chest:  Lung bases are clear.  Hepatobiliary: Liver is within normal limits. No suspicious/enhancing hepatic lesions. No hepatic steatosis.  Mildly distended gallbladder with layering tiny gallstones/sludge (series 11/ image 33). No associated inflammatory changes.  Mild central intrahepatic ductal prominence. Common duct is dilated, measuring 20 mm, and abruptly tapers at the ampulla. No choledocholithiasis is seen.  Pancreas: Mild peripancreatic inflammatory changes with fluid/stranding along the distal pancreatic body/tail. No drainable fluid collection/pseudocyst.  Irregular dilatation of the pancreatic duct  which abruptly tapers in the region of the pancreatic head (double duct sign). No definite pancreatic head mass is visualized, noting motion degradation.  Spleen: Within normal limits.  Adrenals/Urinary Tract: Adrenal glands are within normal limits.  Kidneys are within normal limits.  No hydronephrosis.  Stomach/Bowel: Stomach and visualized bowel are grossly unremarkable.  Vascular/Lymphatic: No evidence of abdominal aortic aneurysm.  No suspicious abdominal lymphadenopathy.  Other: Small volume fluid in the left mid abdomen.  Musculoskeletal: No focal osseous lesions.  IMPRESSION: Motion degraded images.  Acute pancreatitis with fluid/stranding  along the distal pancreatic body/tail. No drainable fluid collection/pseudocyst.  Dilated common duct, measuring 20 mm, abruptly tapering at the ampulla. No choledocholithiasis is seen.  Associated irregular dilatation of the pancreatic duct which abruptly tapers in the region of the pancreatic head (double duct sign). While no definite pancreatic head mass is visualized on the current study, follow-up EUS or repeat MRI abdomen with/without contrast in 3-4 weeks to exclude an occult pancreatic head neoplasm.  Cholelithiasis, without associated inflammatory changes to suggest acute cholecystitis.   Electronically Signed   By: Julian Hy M.D.   On: 07/24/2014 16:17    Microbiology: Recent Results (from the past 240 hour(s))  Surgical pcr screen     Status: Abnormal   Collection Time: 07/23/14 10:20 PM  Result Value Ref Range Status   MRSA, PCR NEGATIVE NEGATIVE Final   Staphylococcus aureus POSITIVE (A) NEGATIVE Final    Comment:        The Xpert SA Assay (FDA approved for NASAL specimens in patients over 87 years of age), is one component of a comprehensive surveillance program.  Test performance has been validated by [facility] for patients greater than or equal to 83 year old. It is not intended to diagnose infection nor to guide or monitor treatment.      Labs: Basic Metabolic Panel:  Recent Labs Lab 07/30/14 1938 07/31/14 0245 08/01/14 0515  NA 138  --  142  K 3.6  --  3.8  CL 98*  --  110  CO2 28  --  26  GLUCOSE 133*  --  79  BUN <5*  --  <5*  CREATININE 0.82 0.61 0.55*  CALCIUM 9.0  --  8.5*   Liver Function Tests:  Recent Labs Lab 07/30/14 1938 07/31/14 0245 08/01/14 0515  AST 26 23 25   ALT 50 44 42  ALKPHOS 226* 210* 213*  BILITOT 0.5 0.4 0.5  PROT 6.6 6.5 5.5*  ALBUMIN 3.6 3.5 3.0*    Recent Labs Lab 07/30/14 1938 07/31/14 0245 08/01/14 0515  LIPASE 644* 397* 162*    Recent Labs Lab 07/31/14 0012  AMMONIA 27   CBC:  Recent  Labs Lab 07/30/14 1938 07/31/14 0245 08/01/14 0515  WBC 7.4 6.6 4.6  HGB 12.3* 11.7* 11.5*  HCT 36.3* 35.3* 34.6*  MCV 94.5 95.7 96.1  PLT 306 264 233   Cardiac Enzymes:  Recent Labs Lab 07/31/14 0012 07/31/14 0245  TROPONINI <0.03 <0.03   BNP: BNP (last 3 results)  Recent Labs  05/01/14 2153 06/06/14 2206  BNP 21.0 28.5    ProBNP (last 3 results) No results for input(s): PROBNP in the last 8760 hours.  CBG:  Recent Labs Lab 07/31/14 1723 07/31/14 1750 08/01/14 0039 08/01/14 0603 08/01/14 0755  GLUCAP 68 76 87 92 82       Signed:  Clarece Drzewiecki MD [facility] 08/01/2014, 4:11 PM

## 2014-08-02 ENCOUNTER — Encounter (HOSPITAL_COMMUNITY): Payer: Self-pay | Admitting: Emergency Medicine

## 2014-08-02 ENCOUNTER — Emergency Department (HOSPITAL_COMMUNITY)
Admission: EM | Admit: 2014-08-02 | Discharge: 2014-08-02 | Disposition: A | Discharge status: Home / Self Care | Payer: Medicare Other

## 2014-08-02 ENCOUNTER — Inpatient Hospital Stay (HOSPITAL_COMMUNITY)
Admission: EM | Admit: 2014-08-02 | Discharge: 2014-08-09 | DRG: 438 | Disposition: A | Discharge status: Skilled Nursing Facility | Payer: Medicare Other | Attending: Family Medicine | Admitting: Family Medicine

## 2014-08-02 ENCOUNTER — Emergency Department (HOSPITAL_COMMUNITY): Payer: Medicare Other

## 2014-08-02 DIAGNOSIS — F209 Schizophrenia, unspecified: Secondary | ICD-10-CM | POA: Diagnosis present

## 2014-08-02 DIAGNOSIS — K852 Alcohol induced acute pancreatitis without necrosis or infection: Secondary | ICD-10-CM

## 2014-08-02 DIAGNOSIS — Z955 Presence of coronary angioplasty implant and graft: Secondary | ICD-10-CM

## 2014-08-02 DIAGNOSIS — Z8674 Personal history of sudden cardiac arrest: Secondary | ICD-10-CM

## 2014-08-02 DIAGNOSIS — G894 Chronic pain syndrome: Secondary | ICD-10-CM | POA: Diagnosis present

## 2014-08-02 DIAGNOSIS — K219 Gastro-esophageal reflux disease without esophagitis: Secondary | ICD-10-CM | POA: Diagnosis present

## 2014-08-02 DIAGNOSIS — F17210 Nicotine dependence, cigarettes, uncomplicated: Secondary | ICD-10-CM | POA: Diagnosis present

## 2014-08-02 DIAGNOSIS — J45909 Unspecified asthma, uncomplicated: Secondary | ICD-10-CM | POA: Diagnosis present

## 2014-08-02 DIAGNOSIS — F1010 Alcohol abuse, uncomplicated: Secondary | ICD-10-CM | POA: Diagnosis present

## 2014-08-02 DIAGNOSIS — R109 Unspecified abdominal pain: Secondary | ICD-10-CM | POA: Diagnosis not present

## 2014-08-02 DIAGNOSIS — K831 Obstruction of bile duct: Secondary | ICD-10-CM | POA: Diagnosis present

## 2014-08-02 DIAGNOSIS — G40909 Epilepsy, unspecified, not intractable, without status epilepticus: Secondary | ICD-10-CM | POA: Diagnosis present

## 2014-08-02 DIAGNOSIS — I10 Essential (primary) hypertension: Secondary | ICD-10-CM | POA: Diagnosis present

## 2014-08-02 DIAGNOSIS — Z811 Family history of alcohol abuse and dependence: Secondary | ICD-10-CM

## 2014-08-02 DIAGNOSIS — K859 Acute pancreatitis without necrosis or infection, unspecified: Secondary | ICD-10-CM | POA: Diagnosis present

## 2014-08-02 DIAGNOSIS — I25810 Atherosclerosis of coronary artery bypass graft(s) without angina pectoris: Secondary | ICD-10-CM | POA: Diagnosis present

## 2014-08-02 DIAGNOSIS — I252 Old myocardial infarction: Secondary | ICD-10-CM

## 2014-08-02 DIAGNOSIS — Z681 Body mass index (BMI) 19 or less, adult: Secondary | ICD-10-CM

## 2014-08-02 DIAGNOSIS — E43 Unspecified severe protein-calorie malnutrition: Secondary | ICD-10-CM | POA: Diagnosis present

## 2014-08-02 DIAGNOSIS — R001 Bradycardia, unspecified: Secondary | ICD-10-CM | POA: Insufficient documentation

## 2014-08-02 DIAGNOSIS — R1030 Lower abdominal pain, unspecified: Secondary | ICD-10-CM

## 2014-08-02 DIAGNOSIS — I952 Hypotension due to drugs: Secondary | ICD-10-CM | POA: Insufficient documentation

## 2014-08-02 LAB — URINALYSIS, ROUTINE W REFLEX MICROSCOPIC
Bilirubin Urine: NEGATIVE
GLUCOSE, UA: NEGATIVE mg/dL
Hgb urine dipstick: NEGATIVE
KETONES UR: NEGATIVE mg/dL
Leukocytes, UA: NEGATIVE
Nitrite: NEGATIVE
PROTEIN: NEGATIVE mg/dL
Specific Gravity, Urine: 1.003 — ABNORMAL LOW (ref 1.005–1.030)
Urobilinogen, UA: 0.2 mg/dL (ref 0.0–1.0)
pH: 7 (ref 5.0–8.0)

## 2014-08-02 LAB — COMPREHENSIVE METABOLIC PANEL
ALK PHOS: 383 U/L — AB (ref 38–126)
ALT: 104 U/L — AB (ref 17–63)
AST: 72 U/L — AB (ref 15–41)
Albumin: 3.9 g/dL (ref 3.5–5.0)
Anion gap: 10 (ref 5–15)
BILIRUBIN TOTAL: 0.3 mg/dL (ref 0.3–1.2)
CO2: 25 mmol/L (ref 22–32)
Calcium: 9.1 mg/dL (ref 8.9–10.3)
Chloride: 105 mmol/L (ref 101–111)
Creatinine, Ser: 0.62 mg/dL (ref 0.61–1.24)
GFR calc Af Amer: >60 mL/min (ref >60)
GFR calc non Af Amer: >60 mL/min (ref >60)
Glucose, Bld: 89 mg/dL (ref 65–99)
Potassium: 3.6 mmol/L (ref 3.5–5.1)
Sodium: 140 mmol/L (ref 135–145)
Total Protein: 7.3 g/dL (ref 6.5–8.1)

## 2014-08-02 LAB — CBC WITH DIFFERENTIAL/PLATELET
Basophils Absolute: 0 10*3/uL (ref 0.0–0.1)
Basophils Relative: 1 % (ref 0–1)
Eosinophils Absolute: 0.2 10*3/uL (ref 0.0–0.7)
Eosinophils Relative: 4 % (ref 0–5)
HEMATOCRIT: 36.8 % — AB (ref 39.0–52.0)
Hemoglobin: 12.3 g/dL — ABNORMAL LOW (ref 13.0–17.0)
Lymphocytes Relative: 38 % (ref 12–46)
Lymphs Abs: 2 10*3/uL (ref 0.7–4.0)
MCH: 32 pg (ref 26.0–34.0)
MCHC: 33.4 g/dL (ref 30.0–36.0)
MCV: 95.8 fL (ref 78.0–100.0)
Monocytes Absolute: 0.4 10*3/uL (ref 0.1–1.0)
Monocytes Relative: 7 % (ref 3–12)
Neutro Abs: 2.6 10*3/uL (ref 1.7–7.7)
Neutrophils Relative %: 50 % (ref 43–77)
Platelets: 293 10*3/uL (ref 150–400)
RBC: 3.84 MIL/uL — ABNORMAL LOW (ref 4.22–5.81)
RDW: 13.9 % (ref 11.5–15.5)
WBC: 5.1 10*3/uL (ref 4.0–10.5)

## 2014-08-02 LAB — LIPASE, BLOOD: LIPASE: 197 U/L — AB (ref 22–51)

## 2014-08-02 LAB — ETHANOL: ALCOHOL ETHYL (B): 45 mg/dL — AB (ref <5)

## 2014-08-02 MED ORDER — SODIUM CHLORIDE 0.9 % IV BOLUS (SEPSIS)
500.0000 mL | Freq: Once | INTRAVENOUS | Status: AC
  Administered 2014-08-02: 500 mL via INTRAVENOUS

## 2014-08-02 NOTE — ED Provider Notes (Signed)
CSN: [id number]     Arrival date & time 08/02/14  2042 History   First MD Initiated Contact with Patient 08/02/14 2134     Chief Complaint  Patient presents with  . Abdominal Pain     (Consider location/radiation/quality/duration/timing/severity/associated sxs/prior Treatment) HPI Comments: Patient presents with abdominal pain. He has a history of pancreatitis alcohol abuse seizures and hypertension. He was admitted from 7/18-7/21 for acute pancreatitis. He ends up leaving AMA. He had an ultrasound on July 14 showing a gallstone or polyp in the fundus of the gallbladder without evidence of cholecystitis. He had an MRCP during the hospitalization which showed common bile duct dilatation with abrupt narrowing at the ampulla. He subsequently came back on July 26 and was admitted for the same pancreatitis until July 28 which was yesterday when he left AMA again. Gastroenterology has seen the patient during this hospitalization and recommended an EUS to be done after the acute pancreatitis has resolved. Patient has ongoing pain throughout his abdomen. He states he's had some vomiting. He denies any fevers. He reports some loose stools. He does drink alcohol daily and does state that he was drinking today. He denies any drug use. He denies any blood in the stool or emesis.  Patient is a 49 y.o. male presenting with abdominal pain.  Abdominal Pain Associated symptoms: diarrhea, nausea and vomiting   Associated symptoms: no chest pain, no chills, no cough, no fatigue, no fever, no hematuria and no shortness of breath     Past Medical History  Diagnosis Date  . Hypertension   . Asthma   . GERD (gastroesophageal reflux disease)   . Chronic pain   . Schizophrenia   . Alcohol abuse   . Seizures 07/2013    seizure like activity following cardiac/resp arrest.   . 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 Depression   . CAD (coronary artery disease)   . Cardiac arrest 07/2013    with acute MI  and secondary respiratory arrest  . Protein calorie malnutrition 07/2013   Past Surgical History  Procedure Laterality Date  . Left heart catheterization with coronary angiogram N/A 07/06/2013    Procedure: LEFT HEART CATHETERIZATION WITH CORONARY ANGIOGRAM;  Surgeon: Clent Demark, MD;  Location: [facility] CATH LAB;  Service: Cardiovascular;  Laterality: N/A;  . Percutaneous coronary stent intervention (pci-s)  07/06/2013    Procedure: PERCUTANEOUS CORONARY STENT INTERVENTION (PCI-S);  Surgeon: Clent Demark, MD;  Location: [facility] CATH LAB;  Service: Cardiovascular;;  . Tracheostomy  07/2013   Family History  Problem Relation Age of Onset  . Malignant hyperthermia Mother   . Cirrhosis Father   . Alcohol abuse Father    History  Substance Use Topics  . Smoking status: Current Every Day Smoker -- 0.00 packs/day    Types: Cigarettes  . Smokeless tobacco: Not on file  . Alcohol Use: Yes     Comment: daily heavy drinker    Review of Systems  Constitutional: Negative for fever, chills, diaphoresis and fatigue.  HENT: Negative for congestion, rhinorrhea and sneezing.   Eyes: Negative.   Respiratory: Negative for cough, chest tightness and shortness of breath.   Cardiovascular: Negative for chest pain and leg swelling.  Gastrointestinal: Positive for nausea, vomiting, abdominal pain and diarrhea. Negative for blood in stool.  Genitourinary: Negative for frequency, hematuria, flank pain and difficulty urinating.  Musculoskeletal: Negative for back pain and arthralgias.  Skin: Negative for rash.  Neurological: Negative for dizziness, speech difficulty, weakness, numbness  and headaches.      Allergies  Hctz; Hydroxyzine; Sulfonamide derivatives; and Cetirizine & related  Home Medications   Prior to Admission medications   Medication Sig Start Date End Date Taking? Authorizing Provider  aspirin 81 MG chewable tablet Chew 1 tablet (81 mg total) by mouth daily. 11/23/13  Yes Kristen N Ranum, DO   atorvastatin (LIPITOR) 80 MG tablet Take 1 tablet (80 mg total) by mouth daily. Patient taking differently: Take 80 mg by mouth at bedtime.  02/13/14  Yes Nicole Pisciotta, PA-C  benztropine (COGENTIN) 0.5 MG tablet Take 0.5 mg by mouth 2 (two) times daily.   Yes Historical Provider, MD  clopidogrel (PLAVIX) 75 MG tablet Take 75 mg by mouth daily. 06/11/14  Yes Historical Provider, MD  divalproex (DEPAKOTE) 500 MG DR tablet Take 1 tablet (500 mg total) by mouth 2 (two) times daily. 02/13/14  Yes Nicole Pisciotta, PA-C  feeding supplement, ENSURE ENLIVE, (ENSURE ENLIVE) LIQD Take 237 mLs by mouth 2 (two) times daily between meals. 06/11/14  Yes Eugenie Filler, MD  folic acid (FOLVITE) 1 MG tablet Take 1 tablet (1 mg total) by mouth daily. 06/11/14  Yes Eugenie Filler, MD  furosemide (LASIX) 40 MG tablet Take 40 mg by mouth daily. 07/18/14  Yes Historical Provider, MD  haloperidol decanoate (HALDOL DECANOATE) 100 MG/ML injection Inject 1 mL into the muscle every 28 (twenty-eight) days. 05/24/14  Yes Historical Provider, MD  hydrALAZINE (APRESOLINE) 10 MG tablet Take 1 tablet (10 mg total) by mouth 2 (two) times daily. 02/13/14  Yes Nicole Pisciotta, PA-C  levETIRAcetam (KEPPRA) 1000 MG tablet Take 1,000 mg by mouth 2 (two) times daily.   Yes Historical Provider, MD  lisinopril (PRINIVIL,ZESTRIL) 2.5 MG tablet Take 2.5 mg by mouth daily.   Yes Historical Provider, MD  LORazepam (ATIVAN) 0.5 MG tablet Take 0.5 mg by mouth at bedtime as needed for anxiety.   Yes Historical Provider, MD  meloxicam (MOBIC) 15 MG tablet Take 15 mg by mouth daily.   Yes Historical Provider, MD  metoprolol tartrate (LOPRESSOR) 25 MG tablet Take 0.5 tablets (12.5 mg total) by mouth 2 (two) times daily. Patient taking differently: Take 25 mg by mouth daily.  02/13/14  Yes Nicole Pisciotta, PA-C  Multiple Vitamin (MULTIVITAMIN WITH MINERALS) TABS tablet Take 1 tablet by mouth daily. 06/11/14  Yes Eugenie Filler, MD  QUEtiapine  (SEROQUEL) 50 MG tablet Take 1 tablet (50 mg total) by mouth at bedtime. 02/13/14  Yes Nicole Pisciotta, PA-C  thiamine 100 MG tablet Take 1 tablet (100 mg total) by mouth daily. 06/11/14  Yes Eugenie Filler, MD  traZODone (DESYREL) 100 MG tablet Take 1 tablet by mouth at bedtime as needed for sleep.  05/13/14  Yes Historical Provider, MD  vitamin B-12 500 MCG tablet Take 1 tablet (500 mcg total) by mouth daily. 07/02/14  Yes Orson Eva, MD  levETIRAcetam (KEPPRA) 250 MG tablet Take 5 tablets (1,250 mg total) by mouth 2 (two) times daily. Patient not taking: Reported on 07/22/2014 06/18/14   Everlene Balls, MD   BP 112/70 mmHg  Pulse 84  Temp(Src) 98.3 F (36.8 C) (Oral)  Resp 18  Ht 6' (1.829 m)  Wt 138 lb 12.8 oz (62.959 kg)  BMI 18.82 kg/m2  SpO2 97% Physical Exam  Constitutional: He is oriented to person, place, and time. He appears well-developed and well-nourished.  HENT:  Head: Normocephalic and atraumatic.  Eyes: Pupils are equal, round, and reactive to light.  Neck:  Normal range of motion. Neck supple.  Cardiovascular: Normal rate, regular rhythm and normal heart sounds.   Pulmonary/Chest: Effort normal and breath sounds normal. No respiratory distress. He has no wheezes. He has no rales. He exhibits no tenderness.  Abdominal: Soft. Bowel sounds are normal. There is tenderness (moderate diffuse tenderness throughout the abdomen). There is no rebound and no guarding.  Musculoskeletal: Normal range of motion. He exhibits no edema.  Lymphadenopathy:    He has no cervical adenopathy.  Neurological: He is alert and oriented to person, place, and time.  Skin: Skin is warm and dry. No rash noted.  Patient has multiple little black bugs crawling all over his clothes  Psychiatric: He has a normal mood and affect.    ED Course  Procedures (including critical care time) Labs Review Labs Reviewed  CBC WITH DIFFERENTIAL/PLATELET - Abnormal; Notable for the following:    RBC 3.84 (*)     Hemoglobin 12.3 (*)    HCT 36.8 (*)    All other components within normal limits  COMPREHENSIVE METABOLIC PANEL - Abnormal; Notable for the following:    BUN <5 (*)    AST 72 (*)    ALT 104 (*)    Alkaline Phosphatase 383 (*)    All other components within normal limits  LIPASE, BLOOD - Abnormal; Notable for the following:    Lipase 197 (*)    All other components within normal limits  URINALYSIS, ROUTINE W REFLEX MICROSCOPIC (NOT AT [facility]) - Abnormal; Notable for the following:    Specific Gravity, Urine 1.003 (*)    All other components within normal limits  ETHANOL - Abnormal; Notable for the following:    Alcohol, Ethyl (B) 45 (*)    All other components within normal limits    Imaging Review Dg Abd Acute W/chest  08/02/2014   CLINICAL DATA:  Lower quadrant abdominal pain bilaterally.  EXAM: DG ABDOMEN ACUTE W/ 1V CHEST  COMPARISON:  07/31/2014  FINDINGS: There is air throughout slightly prominent small bowel, nonobstructive. Stool and air is present throughout the colon. There is no free intraperitoneal air. The upright view of the chest is negative for acute abnormality.  IMPRESSION: Negative abdominal radiographs.  No acute cardiopulmonary disease.   Electronically Signed   By: Andreas Newport M.D.   On: 08/02/2014 22:33     EKG Interpretation None      MDM   Final diagnoses:  Alcohol-induced acute pancreatitis    Patient with pancreatitis. He has recent findings of an enlarged common bile duct with obstruction at the ampulla. He is amenable to admission. He has left AMA the last 2 admissions but he states he will stay this time. I will consult the hospitalist for admission.    Malvin Johns, MD 08/02/14 [phone]

## 2014-08-02 NOTE — ED Notes (Signed)
Patient stated "he wanted to go" and "didnt want to be seen".

## 2014-08-02 NOTE — ED Notes (Signed)
Pt arrived to the ED via EMS with a complaint of abdominal pain.  Pt states pain is located on the right lower abdomen.  Pt states it is gall stones.  Pt was recently discharged from hospital for pancreatitis.  Pt states he has ETOH on board.  Pt states he needs pain medication.

## 2014-08-03 ENCOUNTER — Emergency Department (HOSPITAL_COMMUNITY): Payer: Medicare Other

## 2014-08-03 ENCOUNTER — Encounter (HOSPITAL_COMMUNITY): Payer: Self-pay | Admitting: Internal Medicine

## 2014-08-03 DIAGNOSIS — R1030 Lower abdominal pain, unspecified: Secondary | ICD-10-CM | POA: Diagnosis not present

## 2014-08-03 DIAGNOSIS — Z681 Body mass index (BMI) 19 or less, adult: Secondary | ICD-10-CM | POA: Diagnosis not present

## 2014-08-03 DIAGNOSIS — R001 Bradycardia, unspecified: Secondary | ICD-10-CM | POA: Diagnosis present

## 2014-08-03 DIAGNOSIS — F17210 Nicotine dependence, cigarettes, uncomplicated: Secondary | ICD-10-CM | POA: Diagnosis present

## 2014-08-03 DIAGNOSIS — K219 Gastro-esophageal reflux disease without esophagitis: Secondary | ICD-10-CM | POA: Diagnosis present

## 2014-08-03 DIAGNOSIS — K852 Alcohol induced acute pancreatitis: Secondary | ICD-10-CM | POA: Diagnosis not present

## 2014-08-03 DIAGNOSIS — F209 Schizophrenia, unspecified: Secondary | ICD-10-CM | POA: Diagnosis present

## 2014-08-03 DIAGNOSIS — J45909 Unspecified asthma, uncomplicated: Secondary | ICD-10-CM | POA: Diagnosis present

## 2014-08-03 DIAGNOSIS — I25810 Atherosclerosis of coronary artery bypass graft(s) without angina pectoris: Secondary | ICD-10-CM | POA: Diagnosis present

## 2014-08-03 DIAGNOSIS — K859 Acute pancreatitis, unspecified: Secondary | ICD-10-CM | POA: Diagnosis not present

## 2014-08-03 DIAGNOSIS — I10 Essential (primary) hypertension: Secondary | ICD-10-CM | POA: Diagnosis not present

## 2014-08-03 DIAGNOSIS — K851 Biliary acute pancreatitis: Secondary | ICD-10-CM | POA: Diagnosis not present

## 2014-08-03 DIAGNOSIS — G40909 Epilepsy, unspecified, not intractable, without status epilepticus: Secondary | ICD-10-CM | POA: Diagnosis present

## 2014-08-03 DIAGNOSIS — R413 Other amnesia: Secondary | ICD-10-CM | POA: Diagnosis not present

## 2014-08-03 DIAGNOSIS — F1010 Alcohol abuse, uncomplicated: Secondary | ICD-10-CM | POA: Diagnosis present

## 2014-08-03 DIAGNOSIS — I252 Old myocardial infarction: Secondary | ICD-10-CM | POA: Diagnosis not present

## 2014-08-03 DIAGNOSIS — Z8674 Personal history of sudden cardiac arrest: Secondary | ICD-10-CM | POA: Diagnosis not present

## 2014-08-03 DIAGNOSIS — G894 Chronic pain syndrome: Secondary | ICD-10-CM | POA: Diagnosis present

## 2014-08-03 DIAGNOSIS — I952 Hypotension due to drugs: Secondary | ICD-10-CM | POA: Diagnosis present

## 2014-08-03 DIAGNOSIS — Z811 Family history of alcohol abuse and dependence: Secondary | ICD-10-CM | POA: Diagnosis not present

## 2014-08-03 DIAGNOSIS — Z955 Presence of coronary angioplasty implant and graft: Secondary | ICD-10-CM | POA: Diagnosis not present

## 2014-08-03 DIAGNOSIS — R109 Unspecified abdominal pain: Secondary | ICD-10-CM | POA: Diagnosis present

## 2014-08-03 DIAGNOSIS — K831 Obstruction of bile duct: Secondary | ICD-10-CM | POA: Diagnosis not present

## 2014-08-03 DIAGNOSIS — E43 Unspecified severe protein-calorie malnutrition: Secondary | ICD-10-CM | POA: Diagnosis not present

## 2014-08-03 LAB — COMPREHENSIVE METABOLIC PANEL
ALK PHOS: 387 U/L — AB (ref 38–126)
ALT: 95 U/L — ABNORMAL HIGH (ref 17–63)
AST: 72 U/L — AB (ref 15–41)
Albumin: 3.3 g/dL — ABNORMAL LOW (ref 3.5–5.0)
Anion gap: 5 (ref 5–15)
BILIRUBIN TOTAL: 0.7 mg/dL (ref 0.3–1.2)
BUN: 6 mg/dL (ref 6–20)
CALCIUM: 8.2 mg/dL — AB (ref 8.9–10.3)
CO2: 26 mmol/L (ref 22–32)
CREATININE: 0.57 mg/dL — AB (ref 0.61–1.24)
Chloride: 110 mmol/L (ref 101–111)
GFR calc Af Amer: >60 mL/min (ref >60)
GFR calc non Af Amer: >60 mL/min (ref >60)
Glucose, Bld: 78 mg/dL (ref 65–99)
Potassium: 3.9 mmol/L (ref 3.5–5.1)
Sodium: 141 mmol/L (ref 135–145)
TOTAL PROTEIN: 6.1 g/dL — AB (ref 6.5–8.1)

## 2014-08-03 LAB — MAGNESIUM: MAGNESIUM: 1.9 mg/dL (ref 1.7–2.4)

## 2014-08-03 LAB — MRSA PCR SCREENING: MRSA by PCR: NEGATIVE

## 2014-08-03 LAB — CBC
HCT: 33.6 % — ABNORMAL LOW (ref 39.0–52.0)
Hemoglobin: 11 g/dL — ABNORMAL LOW (ref 13.0–17.0)
MCH: 31.6 pg (ref 26.0–34.0)
MCHC: 32.7 g/dL (ref 30.0–36.0)
MCV: 96.6 fL (ref 78.0–100.0)
Platelets: 248 10*3/uL (ref 150–400)
RBC: 3.48 MIL/uL — AB (ref 4.22–5.81)
RDW: 14 % (ref 11.5–15.5)
WBC: 4.5 10*3/uL (ref 4.0–10.5)

## 2014-08-03 LAB — TSH: TSH: 1.51 u[IU]/mL (ref 0.350–4.500)

## 2014-08-03 LAB — PREALBUMIN: Prealbumin: 20 mg/dL (ref 18–38)

## 2014-08-03 LAB — PHOSPHORUS: PHOSPHORUS: 3.8 mg/dL (ref 2.5–4.6)

## 2014-08-03 LAB — LIPASE, BLOOD: Lipase: 124 U/L — ABNORMAL HIGH (ref 22–51)

## 2014-08-03 MED ORDER — ACETAMINOPHEN 650 MG RE SUPP: 650.0000 mg | Freq: Four times a day (QID) | RECTAL | Status: DC | PRN

## 2014-08-03 MED ORDER — THIAMINE HCL 100 MG/ML IJ SOLN
100.0000 mg | Freq: Every day | INTRAMUSCULAR | Status: DC
  Filled 2014-08-03 (×5): qty 1

## 2014-08-03 MED ORDER — ACETAMINOPHEN 325 MG PO TABS
650.0000 mg | ORAL_TABLET | Freq: Four times a day (QID) | ORAL | Status: DC | PRN
  Administered 2014-08-03 – 2014-08-09 (×5): 650 mg via ORAL
  Filled 2014-08-03 (×5): qty 2

## 2014-08-03 MED ORDER — M.V.I. ADULT IV INJ
INJECTION | Freq: Once | INTRAVENOUS | Status: AC
  Administered 2014-08-03: 03:00:00 via INTRAVENOUS
  Filled 2014-08-03: qty 1000

## 2014-08-03 MED ORDER — IOHEXOL 300 MG/ML  SOLN
100.0000 mL | Freq: Once | INTRAMUSCULAR | Status: AC | PRN
Start: 2014-08-03 — End: 2014-08-03
  Administered 2014-08-03: 100 mL via INTRAVENOUS

## 2014-08-03 MED ORDER — HYDRALAZINE HCL 10 MG PO TABS
10.0000 mg | ORAL_TABLET | Freq: Two times a day (BID) | ORAL | Status: DC
  Administered 2014-08-03 – 2014-08-04 (×2): 10 mg via ORAL
  Filled 2014-08-03 (×6): qty 1

## 2014-08-03 MED ORDER — TRAZODONE HCL 50 MG PO TABS
100.0000 mg | ORAL_TABLET | Freq: Every evening | ORAL | Status: DC | PRN
  Administered 2014-08-03: 100 mg via ORAL
  Filled 2014-08-03: qty 2

## 2014-08-03 MED ORDER — ASPIRIN 81 MG PO CHEW
81.0000 mg | CHEWABLE_TABLET | Freq: Every day | ORAL | Status: DC
  Administered 2014-08-03 – 2014-08-09 (×7): 81 mg via ORAL
  Filled 2014-08-03 (×7): qty 1

## 2014-08-03 MED ORDER — HYDROCODONE-ACETAMINOPHEN 5-325 MG PO TABS
1.0000 | ORAL_TABLET | ORAL | Status: DC | PRN
  Administered 2014-08-03: 2 via ORAL
  Filled 2014-08-03: qty 2

## 2014-08-03 MED ORDER — LORAZEPAM 1 MG PO TABS: 1.0000 mg | ORAL_TABLET | Freq: Four times a day (QID) | ORAL | Status: DC | PRN

## 2014-08-03 MED ORDER — LEVETIRACETAM 500 MG PO TABS
1000.0000 mg | ORAL_TABLET | Freq: Two times a day (BID) | ORAL | Status: DC
  Administered 2014-08-03 – 2014-08-09 (×13): 1000 mg via ORAL
  Filled 2014-08-03 (×16): qty 2

## 2014-08-03 MED ORDER — CLOPIDOGREL BISULFATE 75 MG PO TABS
75.0000 mg | ORAL_TABLET | Freq: Every day | ORAL | Status: DC
  Administered 2014-08-03 – 2014-08-09 (×7): 75 mg via ORAL
  Filled 2014-08-03 (×7): qty 1

## 2014-08-03 MED ORDER — SODIUM CHLORIDE 0.9 % IV SOLN: INTRAVENOUS | Status: AC

## 2014-08-03 MED ORDER — ONDANSETRON HCL 4 MG/2ML IJ SOLN: 4.0000 mg | Freq: Four times a day (QID) | INTRAMUSCULAR | Status: DC | PRN

## 2014-08-03 MED ORDER — BENZTROPINE MESYLATE 0.5 MG PO TABS
0.5000 mg | ORAL_TABLET | Freq: Two times a day (BID) | ORAL | Status: DC
Start: 2014-08-03 — End: 2014-08-09
  Administered 2014-08-03 – 2014-08-09 (×13): 0.5 mg via ORAL
  Filled 2014-08-03 (×15): qty 1

## 2014-08-03 MED ORDER — LISINOPRIL 2.5 MG PO TABS
2.5000 mg | ORAL_TABLET | Freq: Every day | ORAL | Status: DC
  Filled 2014-08-03: qty 1

## 2014-08-03 MED ORDER — LORAZEPAM 2 MG/ML IJ SOLN
0.0000 mg | Freq: Four times a day (QID) | INTRAMUSCULAR | Status: DC
  Filled 2014-08-03: qty 1

## 2014-08-03 MED ORDER — LORAZEPAM 2 MG/ML IJ SOLN: 0.0000 mg | Freq: Two times a day (BID) | INTRAMUSCULAR | Status: DC

## 2014-08-03 MED ORDER — LEVETIRACETAM 750 MG PO TABS: 1250.0000 mg | ORAL_TABLET | Freq: Two times a day (BID) | ORAL | Status: DC

## 2014-08-03 MED ORDER — DIVALPROEX SODIUM 500 MG PO DR TAB
500.0000 mg | DELAYED_RELEASE_TABLET | Freq: Two times a day (BID) | ORAL | Status: DC
Start: 2014-08-03 — End: 2014-08-09
  Administered 2014-08-03 – 2014-08-09 (×13): 500 mg via ORAL
  Filled 2014-08-03 (×15): qty 1

## 2014-08-03 MED ORDER — VITAMIN B-1 100 MG PO TABS
100.0000 mg | ORAL_TABLET | Freq: Every day | ORAL | Status: DC
  Administered 2014-08-03 – 2014-08-09 (×7): 100 mg via ORAL
  Filled 2014-08-03 (×7): qty 1

## 2014-08-03 MED ORDER — ADULT MULTIVITAMIN W/MINERALS CH
1.0000 | ORAL_TABLET | Freq: Every day | ORAL | Status: DC
  Administered 2014-08-03 – 2014-08-09 (×7): 1 via ORAL
  Filled 2014-08-03 (×7): qty 1

## 2014-08-03 MED ORDER — IOHEXOL 300 MG/ML  SOLN
50.0000 mL | Freq: Once | INTRAMUSCULAR | Status: AC | PRN
  Administered 2014-08-03: 50 mL via ORAL

## 2014-08-03 MED ORDER — METOPROLOL TARTRATE 25 MG PO TABS: 25.0000 mg | ORAL_TABLET | Freq: Every day | ORAL | Status: DC

## 2014-08-03 MED ORDER — QUETIAPINE FUMARATE 50 MG PO TABS
50.0000 mg | ORAL_TABLET | Freq: Every day | ORAL | Status: DC
  Administered 2014-08-03 – 2014-08-08 (×6): 50 mg via ORAL
  Filled 2014-08-03 (×7): qty 1

## 2014-08-03 MED ORDER — MORPHINE SULFATE 2 MG/ML IJ SOLN
2.0000 mg | Freq: Once | INTRAMUSCULAR | Status: AC
  Administered 2014-08-03: 2 mg via INTRAVENOUS
  Filled 2014-08-03: qty 1

## 2014-08-03 MED ORDER — ONDANSETRON HCL 4 MG PO TABS: 4.0000 mg | ORAL_TABLET | Freq: Four times a day (QID) | ORAL | Status: DC | PRN

## 2014-08-03 MED ORDER — FOLIC ACID 1 MG PO TABS
1.0000 mg | ORAL_TABLET | Freq: Every day | ORAL | Status: DC
  Administered 2014-08-03 – 2014-08-09 (×7): 1 mg via ORAL
  Filled 2014-08-03 (×7): qty 1

## 2014-08-03 MED ORDER — LORAZEPAM 2 MG/ML IJ SOLN: 1.0000 mg | Freq: Four times a day (QID) | INTRAMUSCULAR | Status: DC | PRN

## 2014-08-03 MED ORDER — SODIUM CHLORIDE 0.9 % IJ SOLN
3.0000 mL | Freq: Two times a day (BID) | INTRAMUSCULAR | Status: DC
  Administered 2014-08-03: 3 mL via INTRAVENOUS

## 2014-08-03 NOTE — H&P (Signed)
PCP: [facility]  [facility]  Referring provider [place]   Chief Complaint:  Abdominal Pain   HPI: Chase Blackwell is a 49 y.o. male   has a past medical history of Hypertension; Asthma; GERD (gastroesophageal reflux disease); Chronic pain; Schizophrenia; Alcohol abuse; Seizures (07/2013); Pancreatitis (01/2014); Depression; CAD (coronary artery disease); Cardiac arrest (07/2013); and Protein calorie malnutrition (07/2013).   Presented with abdominal pain. Patient have been seen repeatedly recently for abdominal pain and was diagnosed with pancreatitis and admitted on 14 for July. MRCP at that time showed dilatation of common bile duct and a broken narrowing of the ampulla. The plan was initially for patient to undergo EUS but he left AMA. Patient presented back on 25th of July. The same abdominal pain he was admitted again. Patient improved after rehydration his diet was advanced and he was seen by GI and their recommendations was for him to undergo repeat CT scan after resolution of pancreatitis as well as still under go outpatient EUS/ERCP  in 2-3 weeks after pancreatitis has resolved. Unfortunately patient left AMA again.  Less than 24 hours patient presented back to emerge department with abdominal pain. On arrival to emerge department patient was found to have elevated lipase up to 197, alcohol level 45, AST up to 72 ALT 104 alkaline phosphatase 383 total bilirubin of 0.3  He has history of schizophrenia has been seen in the past by psychiatry. Per psychiatry note from 28th of July patient does not have capacity to make his own medical decisions and he cannot understand complicated medical treatments and procedures. Patient has history of alcohol abuse but during his prior hospitalization did not show any evidence of DTs He has history of seizure disorder and has been continued on his Keppra.   Of note patient has history of bed bug infestation which is severe during his prior  hospitalizations she was put on contact precautions.  Hospitalist was called for admission for  persistent pancreatitis with history of biliary duct dilatation as well as alcohol abuse  Review of Systems:    Pertinent positives include:  abdominal pain, nausea,  Constitutional:  No weight loss, night sweats, Fevers, chills, fatigue, weight loss  HEENT:  No headaches, Difficulty swallowing,Tooth/dental problems,Sore throat,  No sneezing, itching, ear ache, nasal congestion, post nasal drip,  Cardio-vascular:  No chest pain, Orthopnea, PND, anasarca, dizziness, palpitations.no Bilateral lower extremity swelling  GI:  No heartburn, indigestion,  vomiting, diarrhea, change in bowel habits, loss of appetite, melena, blood in stool, hematemesis Resp:  no shortness of breath at rest. No dyspnea on exertion, No excess mucus, no productive cough, No non-productive cough, No coughing up of blood.No change in color of mucus.No wheezing. Skin:  no rash or lesions. No jaundice GU:  no dysuria, change in color of urine, no urgency or frequency. No straining to urinate.  No flank pain.  Musculoskeletal:  No joint pain or no joint swelling. No decreased range of motion. No back pain.  Psych:  No change in mood or affect. No depression or anxiety. No memory loss.  Neuro: no localizing neurological complaints, no tingling, no weakness, no double vision, no gait abnormality, no slurred speech, no confusion  Otherwise ROS are negative except for above, 10 systems were reviewed  Past Medical History: Past Medical History  Diagnosis Date  . Hypertension   . Asthma   . GERD (gastroesophageal reflux disease)   . Chronic pain   . Schizophrenia   . Alcohol abuse   .  Seizures 07/2013    seizure like activity following cardiac/resp arrest.   . Pancreatitis 0/2585    alcoholic, recurrent  . Depression   . CAD (coronary artery disease)   . Cardiac arrest 07/2013    with acute MI and secondary  respiratory arrest  . Protein calorie malnutrition 07/2013   Past Surgical History  Procedure Laterality Date  . Left heart catheterization with coronary angiogram N/A 07/06/2013    Procedure: LEFT HEART CATHETERIZATION WITH CORONARY ANGIOGRAM;  Surgeon: Clent Demark, MD;  Location: [facility] CATH LAB;  Service: Cardiovascular;  Laterality: N/A;  . Percutaneous coronary stent intervention (pci-s)  07/06/2013    Procedure: PERCUTANEOUS CORONARY STENT INTERVENTION (PCI-S);  Surgeon: Clent Demark, MD;  Location: [facility] CATH LAB;  Service: Cardiovascular;;  . Tracheostomy  07/2013     Medications: Prior to Admission medications   Medication Sig Start Date End Date Taking? Authorizing Provider  aspirin 81 MG chewable tablet Chew 1 tablet (81 mg total) by mouth daily. 11/23/13  Yes Kristen N Archibeque, DO  atorvastatin (LIPITOR) 80 MG tablet Take 1 tablet (80 mg total) by mouth daily. Patient taking differently: Take 80 mg by mouth at bedtime.  02/13/14  Yes Nicole Pisciotta, PA-C  benztropine (COGENTIN) 0.5 MG tablet Take 0.5 mg by mouth 2 (two) times daily.   Yes Historical Provider, MD  clopidogrel (PLAVIX) 75 MG tablet Take 75 mg by mouth daily. 06/11/14  Yes Historical Provider, MD  divalproex (DEPAKOTE) 500 MG DR tablet Take 1 tablet (500 mg total) by mouth 2 (two) times daily. 02/13/14  Yes Nicole Pisciotta, PA-C  feeding supplement, ENSURE ENLIVE, (ENSURE ENLIVE) LIQD Take 237 mLs by mouth 2 (two) times daily between meals. 06/11/14  Yes Eugenie Filler, MD  folic acid (FOLVITE) 1 MG tablet Take 1 tablet (1 mg total) by mouth daily. 06/11/14  Yes Eugenie Filler, MD  furosemide (LASIX) 40 MG tablet Take 40 mg by mouth daily. 07/18/14  Yes Historical Provider, MD  haloperidol decanoate (HALDOL DECANOATE) 100 MG/ML injection Inject 1 mL into the muscle every 28 (twenty-eight) days. 05/24/14  Yes Historical Provider, MD  hydrALAZINE (APRESOLINE) 10 MG tablet Take 1 tablet (10 mg total) by mouth 2 (two) times  daily. 02/13/14  Yes Nicole Pisciotta, PA-C  levETIRAcetam (KEPPRA) 1000 MG tablet Take 1,000 mg by mouth 2 (two) times daily.   Yes Historical Provider, MD  lisinopril (PRINIVIL,ZESTRIL) 2.5 MG tablet Take 2.5 mg by mouth daily.   Yes Historical Provider, MD  LORazepam (ATIVAN) 0.5 MG tablet Take 0.5 mg by mouth at bedtime as needed for anxiety.   Yes Historical Provider, MD  meloxicam (MOBIC) 15 MG tablet Take 15 mg by mouth daily.   Yes Historical Provider, MD  metoprolol tartrate (LOPRESSOR) 25 MG tablet Take 0.5 tablets (12.5 mg total) by mouth 2 (two) times daily. Patient taking differently: Take 25 mg by mouth daily.  02/13/14  Yes Nicole Pisciotta, PA-C  Multiple Vitamin (MULTIVITAMIN WITH MINERALS) TABS tablet Take 1 tablet by mouth daily. 06/11/14  Yes Eugenie Filler, MD  QUEtiapine (SEROQUEL) 50 MG tablet Take 1 tablet (50 mg total) by mouth at bedtime. 02/13/14  Yes Nicole Pisciotta, PA-C  thiamine 100 MG tablet Take 1 tablet (100 mg total) by mouth daily. 06/11/14  Yes Eugenie Filler, MD  traZODone (DESYREL) 100 MG tablet Take 1 tablet by mouth at bedtime as needed for sleep.  05/13/14  Yes Historical Provider, MD  vitamin B-12 500 MCG tablet  Take 1 tablet (500 mcg total) by mouth daily. 07/02/14  Yes Orson Eva, MD  levETIRAcetam (KEPPRA) 250 MG tablet Take 5 tablets (1,250 mg total) by mouth 2 (two) times daily. Patient not taking: Reported on 07/22/2014 06/18/14   Everlene Balls, MD    Allergies:   Allergies  Allergen Reactions  . Hctz [Hydrochlorothiazide] Other (See Comments)    Dizzy spells  . Hydroxyzine Hives  . Sulfonamide Derivatives Hives  . Cetirizine & Related Other (See Comments)    Brothers were not aware of this allergy    Social History:  Ambulatory independently      reports that he has been smoking Cigarettes.  He has been smoking about 0.00 packs per day. He does not have any smokeless tobacco history on file. He reports that he drinks alcohol. He reports that  he uses illicit drugs (Marijuana).    Family History: family history includes Alcohol abuse in his father; Cirrhosis in his father; Malignant hyperthermia in his mother.    Physical Exam: Patient Vitals for the past 24 hrs:  BP Temp Temp src Pulse Resp SpO2 Height Weight  08/02/14 2343 112/70 mmHg - - 84 18 97 % - -  08/02/14 2046 110/77 mmHg 98.3 F (36.8 C) Oral 87 20 97 % 6' (1.829 m) 62.959 kg (138 lb 12.8 oz)  08/02/14 2043 - - - - - 96 % - -    1. General:  in No Acute distress 2. Psychological: Alert and  Oriented 3. Head/ENT:    Dry Mucous Membranes                          Head Non traumatic, neck supple                            Poor Dentition 4. SKIN:  decreased Skin turgor,  Skin clean Dry and intact,    Rash present,bed bug infestation noted 5. Heart: Regular rate and rhythm no Murmur, Rub or gallop 6. Lungs: Clear to auscultation bilaterally, no wheezes or crackles   7. Abdomen: Soft,  tender, Non distended 8. Lower extremities: no clubbing, cyanosis, or edema 9. Neurologically Grossly intact, moving all 4 extremities equally 10. MSK: Normal range of motion  body mass index is 18.82 kg/(m^2).   Labs on Admission:   Results for orders placed or performed during the hospital encounter of 08/02/14 (from the past 24 hour(s))  CBC with Differential     Status: Abnormal   Collection Time: 08/02/14 10:02 PM  Result Value Ref Range   WBC 5.1 4.0 - 10.5 K/uL   RBC 3.84 (L) 4.22 - 5.81 MIL/uL   Hemoglobin 12.3 (L) 13.0 - 17.0 g/dL   HCT 36.8 (L) 39.0 - 52.0 %   MCV 95.8 78.0 - 100.0 fL   MCH 32.0 26.0 - 34.0 pg   MCHC 33.4 30.0 - 36.0 g/dL   RDW 13.9 11.5 - 15.5 %   Platelets 293 150 - 400 K/uL   Neutrophils Relative % 50 43 - 77 %   Neutro Abs 2.6 1.7 - 7.7 K/uL   Lymphocytes Relative 38 12 - 46 %   Lymphs Abs 2.0 0.7 - 4.0 K/uL   Monocytes Relative 7 3 - 12 %   Monocytes Absolute 0.4 0.1 - 1.0 K/uL   Eosinophils Relative 4 0 - 5 %   Eosinophils Absolute  0.2 0.0 - 0.7 K/uL  Basophils Relative 1 0 - 1 %   Basophils Absolute 0.0 0.0 - 0.1 K/uL  Comprehensive metabolic panel     Status: Abnormal   Collection Time: 08/02/14 10:02 PM  Result Value Ref Range   Sodium 140 135 - 145 mmol/L   Potassium 3.6 3.5 - 5.1 mmol/L   Chloride 105 101 - 111 mmol/L   CO2 25 22 - 32 mmol/L   Glucose, Bld 89 65 - 99 mg/dL   BUN <5 (L) 6 - 20 mg/dL   Creatinine, Ser 0.62 0.61 - 1.24 mg/dL   Calcium 9.1 8.9 - 10.3 mg/dL   Total Protein 7.3 6.5 - 8.1 g/dL   Albumin 3.9 3.5 - 5.0 g/dL   AST 72 (H) 15 - 41 U/L   ALT 104 (H) 17 - 63 U/L   Alkaline Phosphatase 383 (H) 38 - 126 U/L   Total Bilirubin 0.3 0.3 - 1.2 mg/dL   GFR calc non Af Amer >60 >60 mL/min   GFR calc Af Amer >60 >60 mL/min   Anion gap 10 5 - 15  Lipase, blood     Status: Abnormal   Collection Time: 08/02/14 10:02 PM  Result Value Ref Range   Lipase 197 (H) 22 - 51 U/L  Ethanol     Status: Abnormal   Collection Time: 08/02/14 10:18 PM  Result Value Ref Range   Alcohol, Ethyl (B) 45 (H) <5 mg/dL  Urinalysis, Routine w reflex microscopic (not at [facility])     Status: Abnormal   Collection Time: 08/02/14 11:40 PM  Result Value Ref Range   Color, Urine YELLOW YELLOW   APPearance CLEAR CLEAR   Specific Gravity, Urine 1.003 (L) 1.005 - 1.030   pH 7.0 5.0 - 8.0   Glucose, UA NEGATIVE NEGATIVE mg/dL   Hgb urine dipstick NEGATIVE NEGATIVE   Bilirubin Urine NEGATIVE NEGATIVE   Ketones, ur NEGATIVE NEGATIVE mg/dL   Protein, ur NEGATIVE NEGATIVE mg/dL   Urobilinogen, UA 0.2 0.0 - 1.0 mg/dL   Nitrite NEGATIVE NEGATIVE   Leukocytes, UA NEGATIVE NEGATIVE    UA  no evidence of UTI  No results found for: HGBA1C  Estimated Creatinine Clearance: 100.6 mL/min (by C-G formula based on Cr of 0.62).  BNP (last 3 results) No results for input(s): PROBNP in the last 8760 hours.  Other results:  I have pearsonaly reviewed this: ECG REPORT  not obtained today   Filed Weights   08/02/14 2046    Weight: 62.959 kg (138 lb 12.8 oz)     Cultures:    Component Value Date/Time   SDES TRACHEAL ASPIRATE 07/23/2013 0309   SPECREQUEST NONE 07/23/2013 0309   CULT  07/23/2013 0309    MODERATE PSEUDOMONAS AERUGINOSA MODERATE KLEBSIELLA PNEUMONIAE Performed at [place] 07/26/2013 FINAL 07/23/2013 0309     Radiological Exams on Admission: Dg Abd Acute W/chest  08/02/2014   CLINICAL DATA:  Lower quadrant abdominal pain bilaterally.  EXAM: DG ABDOMEN ACUTE W/ 1V CHEST  COMPARISON:  07/31/2014  FINDINGS: There is air throughout slightly prominent small bowel, nonobstructive. Stool and air is present throughout the colon. There is no free intraperitoneal air. The upright view of the chest is negative for acute abnormality.  IMPRESSION: Negative abdominal radiographs.  No acute cardiopulmonary disease.   Electronically Signed   By: Andreas Newport M.D.   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 Alcohol abuse - CIWA protocol watch for withdrawal  . Essential hypertension - holding lisinopril given evidence of pancreatitis monitor blood pressure  . Pancreatitis - continues to have elevated lipase. Of note patient's abdominal pain is more of a lower abdominal pain will need to father evaluate  . Common bile duct (CBD) obstruction - we will need discuss further GI if patient be better served to complete workup while in house secondary to poor compliance  . Schizophrenia continue home medications  . CAD (coronary artery disease) of artery bypass graft continue Plavix holding statins given LFT of the patient's continue aspirin Lower abdominal pain - not typical with  pancreatitis. We'll obtain CT of abdomen and pelvis to evaluate this further Bed bug infestation - cntactprecaution  Prophylaxis:  SCD  CODE STATUS:  FULL CODE  Disposition:   likely will need placement for rehabilitation, and compliance with medical management. Patient appears psychiatry note on the 28th lacks capacity to make medical decisions. Unsure if he has any social support will need social work consult.                  Other plan as per orders.  I have spent a total of 55 min on this admission  Geary Rufo 08/03/2014, 12:18 AM  [facility]  Pager [phone]   after 2 AM please page floor coverage PA If 7AM-7PM, please contact the day team taking care of the patient  [email or web address]  Password [email or web address]

## 2014-08-03 NOTE — Clinical Social Work Note (Signed)
CSW received a call from RN stating that MD is requesting IVC paperwork for pt.   CSW met with MD and provided IVC paperwork for her to fill out.  CSW notarized the forms and faxed to the magistrates office.   CSW will follow up with service  .Dede Query, LCSW [facility] Clinical Social Worker - Weekend Coverage cell #: [phone]

## 2014-08-03 NOTE — ED Notes (Signed)
Pt to have CT scan done first prior to transferring to floor unit.

## 2014-08-03 NOTE — Progress Notes (Signed)
[facility]  I evaluated the patient earlier this AM. I have reviewed his records in detail. He is a re-admit for acute pancreatitis. He left AMA on 7/21 and again on 7/28 when he was admitted for the same reason. He was deemed to lack capacity to make medical decisions for himself by psychiatry on 7/28. I have done paperwork today to involuntarily commit him.    Principal Problem:   Pancreatitis - cont NPO until abd pain resolved - MRCP revealed pancreatic duct stricture and CBD dilatation- per GI, he needs EUS/ ERCP about 2-3 wks after acute pancreatitis resolves  Active Problems:   hypotension with h/o Essential hypertension - BP in 67P systolic- hold antihypertensives today  Bradycardia - HR in 40s- hold Metoprolol    CAD (coronary artery disease) of artery bypass graft s/p Stent - on plavix,  Aspirin, statin    Schizophrenia - cont psych medications  Seizure disorder - cont Keppra  ETOH abuse - will counsel him  Debbe Odea, MD

## 2014-08-04 DIAGNOSIS — K831 Obstruction of bile duct: Secondary | ICD-10-CM

## 2014-08-04 DIAGNOSIS — K859 Acute pancreatitis, unspecified: Secondary | ICD-10-CM

## 2014-08-04 LAB — COMPREHENSIVE METABOLIC PANEL
ALBUMIN: 3.1 g/dL — AB (ref 3.5–5.0)
ALT: 268 U/L — ABNORMAL HIGH (ref 17–63)
AST: 351 U/L — AB (ref 15–41)
Alkaline Phosphatase: 445 U/L — ABNORMAL HIGH (ref 38–126)
Anion gap: 7 (ref 5–15)
BUN: 6 mg/dL (ref 6–20)
CHLORIDE: 111 mmol/L (ref 101–111)
CO2: 24 mmol/L (ref 22–32)
CREATININE: 0.55 mg/dL — AB (ref 0.61–1.24)
Calcium: 8.3 mg/dL — ABNORMAL LOW (ref 8.9–10.3)
Glucose, Bld: 81 mg/dL (ref 65–99)
Potassium: 4.2 mmol/L (ref 3.5–5.1)
Sodium: 142 mmol/L (ref 135–145)
TOTAL PROTEIN: 5.9 g/dL — AB (ref 6.5–8.1)
Total Bilirubin: 0.6 mg/dL (ref 0.3–1.2)

## 2014-08-04 LAB — LIPASE, BLOOD: LIPASE: 305 U/L — AB (ref 22–51)

## 2014-08-04 MED ORDER — MORPHINE SULFATE 2 MG/ML IJ SOLN
2.0000 mg | INTRAMUSCULAR | Status: DC | PRN
Start: 2014-08-04 — End: 2014-08-06
  Administered 2014-08-04: 2 mg via INTRAVENOUS
  Administered 2014-08-04: 4 mg via INTRAVENOUS
  Administered 2014-08-04 – 2014-08-06 (×7): 2 mg via INTRAVENOUS
  Filled 2014-08-04 (×2): qty 1
  Filled 2014-08-04: qty 2
  Filled 2014-08-04 (×6): qty 1

## 2014-08-04 MED ORDER — SODIUM CHLORIDE 0.9 % IV SOLN
INTRAVENOUS | Status: DC
  Administered 2014-08-04 – 2014-08-09 (×15): via INTRAVENOUS

## 2014-08-04 MED ORDER — HYDRALAZINE HCL 10 MG PO TABS
10.0000 mg | ORAL_TABLET | Freq: Two times a day (BID) | ORAL | Status: DC
  Administered 2014-08-05 – 2014-08-08 (×8): 10 mg via ORAL
  Filled 2014-08-04 (×11): qty 1

## 2014-08-04 NOTE — Progress Notes (Signed)
[facility] Progress Note   Chase Blackwell QPY:[id number] DOB: 18-Jan-1965 DOA: 08/02/2014 PCP: [facility]  Brief narrative: Chase Blackwell is a 49 y.o. male who has had 7 admissions since January for acute pancreatitis. He has left AMA and number of times. He is a re-admit for acute pancreatitis. He left AMA on 7/21 and again on 7/28 when he was admitted for the same reason. He was deemed to lack capacity to make medical decisions for himself by psychiatry on 7/28. I have done paperwork to involuntarily commit him.   Past medical history includes a history of hypertension, asthma, schizoaffective disorder, alcohol abuse GERD and chronic pain syndrome. He was found unresponsive on on the sidewalk on 07/06/2013 and found to have a large MI which resulted in V. fib arrest. He was shocked multiple times. And taken emergently to the Cath Lab where he received an RCA stent. He was noted to have seizure-like activity in the rewarming period. He ended up with a tracheostomy and subsequently developed tracheobronchitis. He was transitioned to an LTAC. And then went home.  Subjective:  Sleepy but awakable. States he had pain again after drinking liquids. No nausea or vomiting  Hospital Course:  Acute Pancreatitis - Patient's pain had resolved yesterday and he wanted to try some clear liquids-however, lipase elevated today and patient has redeveloped abdominal pain and therefore have backed off on liquids and placed him back on nothing by mouth-will resume morphine IV for pain control - MRCP revealed pancreatic duct stricture and CBD dilatation- per GI, he needs EUS/ ERCP about 2-3 wks after acute pancreatitis resolves- may need procedure sooner if pancreatitis is not resolving- cont to follow symptoms and lipase closelt  Active Problems:  hypotension with h/o Essential hypertension - BP in 12W systolic on 5/80 - hold Metoprolol- cont Hydralazine with holding parameters  Bradycardia - HR in  40s and 7/30- held Metoprolol 12.5 milligrams twice a day- rate around 50s and 60s- continue to hold metoprolol   CAD (coronary artery disease) s/p Stent in July 2015 after cardiac arrest - on plavix, Aspirin, statin   Schizophrenia - cont psych medications  Seizure disorder - cont Keppra  ETOH abuse -Counseled   Appt with PCP: Code Status: Full code Family Communication:  Disposition Plan: Continue to follow in hospital under involuntary commitment DVT prophylaxis: SCDs, Plavix aspirin Consultants: none Procedures:   Antibiotics: Anti-infectives    None      Objective: Filed Weights   08/02/14 2046 08/03/14 0233  Weight: 62.959 kg (138 lb 12.8 oz) 61.3 kg (135 lb 2.3 oz)    Intake/Output Summary (Last 24 hours) at 08/04/14 1103 Last data filed at 08/04/14 0704  Gross per 24 hour  Intake    720 ml  Output   1025 ml  Net   -305 ml     Vitals Filed Vitals:   08/03/14 2146 08/04/14 0146 08/04/14 0507 08/04/14 0959  BP: 109/64 115/69 119/58 125/71  Pulse: 51 66 50   Temp: 97.8 F (36.6 C) 97.9 F (36.6 C) 97.8 F (36.6 C)   TempSrc: Oral Oral Oral   Resp: 14 20 16    Height:      Weight:      SpO2: 100% 99% 100%     Exam:  General:  Sleepy but wakable, not in acute distress  HEENT: No icterus, No thrush, oral mucosa moist  Cardiovascular: regular rate and rhythm, S1/S2 No murmur  Respiratory: clear to auscultation bilaterally   Abdomen:  Soft, +Bowel sounds, tender in med abdomen, non distended, no guarding  MSK: No LE edema, cyanosis or clubbing  Data Reviewed: Basic Metabolic Panel:  Recent Labs Lab 07/30/14 1938 07/31/14 0245 08/01/14 0515 08/02/14 2202 08/03/14 0530 08/04/14 0528  NA 138  --  142 140 141 142  K 3.6  --  3.8 3.6 3.9 4.2  CL 98*  --  110 105 110 111  CO2 28  --  26 25 26 24   GLUCOSE 133*  --  79 89 78 81  BUN <5*  --  <5* <5* 6 6  CREATININE 0.82 0.61 0.55* 0.62 0.57* 0.55*  CALCIUM 9.0  --  8.5* 9.1 8.2* 8.3*   MG  --   --   --   --  1.9  --   PHOS  --   --   --   --  3.8  --    Liver Function Tests:  Recent Labs Lab 07/31/14 0245 08/01/14 0515 08/02/14 2202 08/03/14 0530 08/04/14 0528  AST 23 25 72* 72* 351*  ALT 44 42 104* 95* 268*  ALKPHOS 210* 213* 383* 387* 445*  BILITOT 0.4 0.5 0.3 0.7 0.6  PROT 6.5 5.5* 7.3 6.1* 5.9*  ALBUMIN 3.5 3.0* 3.9 3.3* 3.1*    Recent Labs Lab 07/31/14 0245 08/01/14 0515 08/02/14 2202 08/03/14 0530 08/04/14 0528  LIPASE 397* 162* 197* 124* 305*    Recent Labs Lab 07/31/14 0012  AMMONIA 27   CBC:  Recent Labs Lab 07/30/14 1938 07/31/14 0245 08/01/14 0515 08/02/14 2202 08/03/14 0530  WBC 7.4 6.6 4.6 5.1 4.5  NEUTROABS  --   --   --  2.6  --   HGB 12.3* 11.7* 11.5* 12.3* 11.0*  HCT 36.3* 35.3* 34.6* 36.8* 33.6*  MCV 94.5 95.7 96.1 95.8 96.6  PLT 306 264 233 293 248   Cardiac Enzymes:  Recent Labs Lab 07/31/14 0012 07/31/14 0245  TROPONINI <0.03 <0.03   BNP (last 3 results)  Recent Labs  05/01/14 2153 06/06/14 2206  BNP 21.0 28.5    ProBNP (last 3 results) No results for input(s): PROBNP in the last 8760 hours.  CBG:  Recent Labs Lab 07/31/14 1750 08/01/14 0039 08/01/14 0603 08/01/14 0755 08/01/14 1252  GLUCAP 76 87 92 82 83    Recent Results (from the past 240 hour(s))  MRSA PCR Screening     Status: None   Collection Time: 08/03/14  3:53 AM  Result Value Ref Range Status   MRSA by PCR NEGATIVE NEGATIVE Final    Comment:        The GeneXpert MRSA Assay (FDA approved for NASAL specimens only), is one component of a comprehensive MRSA colonization surveillance program. It is not intended to diagnose MRSA infection nor to guide or monitor treatment for MRSA infections.      Studies: Ct Abdomen Pelvis W Contrast  08/03/2014   CLINICAL DATA:  Persistent lower abdominal pain.  EXAM: CT ABDOMEN AND PELVIS WITH CONTRAST  TECHNIQUE: Multidetector CT imaging of the abdomen and pelvis was performed  using the standard protocol following bolus administration of intravenous contrast.  CONTRAST:  35mL OMNIPAQUE IOHEXOL 300 MG/ML SOLN, 164mL OMNIPAQUE IOHEXOL 300 MG/ML SOLN  COMPARISON:  MRCP 08/10/2024, CT 01/12/2014  FINDINGS: The included lung bases are clear.  There is intra and extrahepatic biliary ductal dilatation. Common bile duct measures 2.2 cm. The gallbladder is distended. Findings are similar to prior exam. No focal hepatic lesion.  There is peripancreatic soft tissue  stranding about the tail. Minimal soft tissue stranding about pancreatic head. Dilated pancreatic duct, similar to prior MRCP. Focal 1 cm round hypodensity in the region of the pancreatic head has appear in ductal communication and likely represent sequela of pancreatitis. There is no peripancreatic pseudocyst. Splenic vein remains patent.  Symmetric renal enhancement. No hydronephrosis. Tiny cortical cyst in the upper right kidney. Mild thickening of the left adrenal gland, right adrenal gland is normal.  Stomach is physiologically distended. There are no dilated or thickened bowel loops. Moderate stool throughout the colon without colonic wall thickening. The appendix is normal. No free air or intra-abdominal fluid collection.  No retroperitoneal adenopathy. Abdominal aorta is normal in caliber. Mild atherosclerosis of the abdominal aorta.  Within the pelvis the bladder is distended. There is fat within both inguinal canals.  There are no acute or suspicious osseous abnormalities.  IMPRESSION: 1. Findings consistent with persistent acute pancreatitis. No drainable fluid collection or pseudocyst. 2. Persistent biliary dilatation, similar to prior MRCP. 3. No new abnormalities are seen.   Electronically Signed   By: Jeb Levering M.D.   On: 08/03/2014 02:18   Dg Abd Acute W/chest  08/02/2014   CLINICAL DATA:  Lower quadrant abdominal pain bilaterally.  EXAM: DG ABDOMEN ACUTE W/ 1V CHEST  COMPARISON:  07/31/2014  FINDINGS: There is  air throughout slightly prominent small bowel, nonobstructive. Stool and air is present throughout the colon. There is no free intraperitoneal air. The upright view of the chest is negative for acute abnormality.  IMPRESSION: Negative abdominal radiographs.  No acute cardiopulmonary disease.   Electronically Signed   By: Andreas Newport M.D.   On: 08/02/2014 22:33    Scheduled Meds:  Scheduled Meds: . aspirin  81 mg Oral Daily  . benztropine  0.5 mg Oral BID  . clopidogrel  75 mg Oral Daily  . divalproex  500 mg Oral BID  . folic acid  1 mg Oral Daily  . hydrALAZINE  10 mg Oral BID  . levETIRAcetam  1,000 mg Oral BID  . multivitamin with minerals  1 tablet Oral Daily  . QUEtiapine  50 mg Oral QHS  . sodium chloride  3 mL Intravenous Q12H  . thiamine  100 mg Oral Daily   Or  . thiamine  100 mg Intravenous Daily   Continuous Infusions: . sodium chloride 125 mL/hr at 08/04/14 1002    Time spent on care of this patient: 35 min   [place], MD 08/04/2014, 11:03 AM  LOS: 1 day   [facility] Office  [phone] Pager - Text Page per [email or web address] If 7PM-7AM, please contact night-coverage [email or web address]

## 2014-08-05 DIAGNOSIS — K851 Biliary acute pancreatitis: Secondary | ICD-10-CM

## 2014-08-05 DIAGNOSIS — E43 Unspecified severe protein-calorie malnutrition: Secondary | ICD-10-CM | POA: Diagnosis present

## 2014-08-05 LAB — COMPREHENSIVE METABOLIC PANEL
ALT: 171 U/L — ABNORMAL HIGH (ref 17–63)
ANION GAP: 6 (ref 5–15)
AST: 104 U/L — AB (ref 15–41)
Albumin: 3.1 g/dL — ABNORMAL LOW (ref 3.5–5.0)
Alkaline Phosphatase: 358 U/L — ABNORMAL HIGH (ref 38–126)
BUN: 6 mg/dL (ref 6–20)
CO2: 26 mmol/L (ref 22–32)
CREATININE: 0.55 mg/dL — AB (ref 0.61–1.24)
Calcium: 8.5 mg/dL — ABNORMAL LOW (ref 8.9–10.3)
Chloride: 111 mmol/L (ref 101–111)
GFR calc Af Amer: >60 mL/min (ref >60)
GLUCOSE: 76 mg/dL (ref 65–99)
POTASSIUM: 3.5 mmol/L (ref 3.5–5.1)
SODIUM: 143 mmol/L (ref 135–145)
Total Bilirubin: 0.6 mg/dL (ref 0.3–1.2)
Total Protein: 5.8 g/dL — ABNORMAL LOW (ref 6.5–8.1)

## 2014-08-05 LAB — LIPASE, BLOOD: Lipase: 117 U/L — ABNORMAL HIGH (ref 22–51)

## 2014-08-05 MED ORDER — ENOXAPARIN SODIUM 40 MG/0.4ML ~~LOC~~ SOLN
40.0000 mg | SUBCUTANEOUS | Status: DC
  Administered 2014-08-05 – 2014-08-09 (×5): 40 mg via SUBCUTANEOUS
  Filled 2014-08-05 (×5): qty 0.4

## 2014-08-05 NOTE — Care Management Note (Signed)
Case Management Note  Patient Details  Name: MONTI JILEK MRN: [id number] Date of Birth: [birth year]  Subjective/Objective:   49 y/o m admitted w/Pancreatitis. Readmit 7/26-7/28-Acute pancreatitis.From home.                 Action/Plan:Noted IVC.   Expected Discharge Date:                  Expected Discharge Plan:  Home/Self Care  In-House Referral:     Discharge planning Services  CM Consult  Post Acute Care Choice:    Choice offered to:     DME Arranged:    DME Agency:     HH Arranged:    HH Agency:     Status of Service:  In process, will continue to follow  Medicare Important Message Given:  Yes-second notification given Date Medicare IM Given:    Medicare IM give by:    Date Additional Medicare IM Given:    Additional Medicare Important Message give by:     If discussed at [place] of Stay Meetings, dates discussed:    Additional Comments:  Dessa Phi, RN 08/05/2014, 3:38 PM

## 2014-08-05 NOTE — Progress Notes (Signed)
Initial Nutrition Assessment  DOCUMENTATION CODES:   Severe malnutrition in context of social or environmental circumstances, Underweight  INTERVENTION:  - Will monitor for needs with diet advancement   NUTRITION DIAGNOSIS:   Inadequate oral intake related to inability to eat as evidenced by NPO status.  GOAL:   Patient will meet greater than or equal to 90% of their needs  MONITOR:   Diet advancement, Weight trends, Labs  REASON FOR ASSESSMENT:   Other (Comment) (underweight BMI)  ASSESSMENT:  49 y.o. Male has a past medical history of Hypertension; Asthma; GERD (gastroesophageal reflux disease); Chronic pain; Schizophrenia; Alcohol abuse; Seizures (07/2013); Pancreatitis (01/2014); Depression; CAD (coronary artery disease); Cardiac arrest (07/2013); and Protein calorie malnutrition (07/2013).   Presented with abdominal pain. Patient have been seen repeatedly recently for abdominal pain and was diagnosed with pancreatitis and admitted on 14 for July. MRCP at that time showed dilatation of common bile duct and a broken narrowing of the ampulla. The plan was initially for patient to undergo EUS but he left AMA. Patient presented back on 25th of July. The same abdominal pain he was admitted again. Patient improved after rehydration his diet was advanced and he was seen by GI and their recommendations was for him to undergo repeat CT scan after resolution of pancreatitis as well as still under go outpatient EUS/ERCP in 2-3 weeks after pancreatitis has resolved. Unfortunately patient left AMA again.   Pt seen for underweight BMI. Pt has been NPO since admission and unable to meet needs. Upon entering the room and introducing self to pt, he states "my sister passed away." RD offered condolences and then pt immediately asked for pain medication; informed him that RN would take care of this at scheduled time. Pt seemed slightly confused during brief interaction.   Did not perform physical  assessment but moderate fat and severe muscle wasting were visible to upper body. Per weight hx review, pt has lost 12 lbs (8% body weight) in the past month which is significant for time frame.  Medications reviewed. Labs reviewed; Ca: 8.5 mg/dL.   Diet Order:  Diet NPO time specified Except for: Sips with Meds  Skin:  Wound (see comment) (R foot wound)  Last BM:  PTA  Height:   Ht Readings from Last 1 Encounters:  08/03/14 6' (1.829 m)    Weight:   Wt Readings from Last 1 Encounters:  08/03/14 135 lb 2.3 oz (61.3 kg)    Ideal Body Weight:  80.91 kg (kg)  BMI:  Body mass index is 18.32 kg/(m^2).  Estimated Nutritional Needs:   Kcal:  2000-2200  Protein:  100-110 grams  Fluid:  2-2.2 L/day  EDUCATION NEEDS:   No education needs identified at this time    Jarome Matin, RD, LDN Inpatient Clinical Dietitian Pager # [phone] After hours/weekend pager # [phone]

## 2014-08-05 NOTE — Care Management Important Message (Signed)
Important Message  Patient Details  Name: RAINIER FEUERBORN MRN: [id number] Date of Birth: [birth year]   Medicare Important Message Given:  Yes-second notification given    Shelda Altes 08/05/2014, 2:45 [place] Message  Patient Details  Name: DELIA SITAR MRN: [id number] Date of Birth: November 17, 1965   Medicare Important Message Given:  Yes-second notification given    Shelda Altes 08/05/2014, 2:45 PM

## 2014-08-05 NOTE — Progress Notes (Addendum)
[facility] Progress Note   Chase Blackwell [id number]:[id number] DOB: [birth year] DOA: 08/02/2014 PCP: [facility]  Brief narrative: Chase Blackwell is a 49 y.o. male who has had 7 admissions since January for acute pancreatitis. He has left AMA and number of times. He is a re-admit for acute pancreatitis. He left AMA on 7/21 and again on 7/28 when he was admitted for the same reason. He was deemed to lack capacity to make medical decisions for himself by psychiatry on 7/28. I have done paperwork to involuntarily commit him.   Past medical history includes a history of hypertension, asthma, schizoaffective disorder, alcohol abuse GERD and chronic pain syndrome. He was found unresponsive on on the sidewalk on 07/06/2013 and found to have a large MI which resulted in V. fib arrest. He was shocked multiple times. And taken emergently to the Cath Lab where he received an RCA stent. He was noted to have seizure-like activity in the rewarming period. He ended up with a tracheostomy and subsequently developed tracheobronchitis. He was transitioned to an LTAC. And then went home.  Subjective:  Sitting up in a chair today. States he continues to have abdominal pain. No nausea or vomiting. Admits that he went home and drank after leaving AMA last time. He states he lives with his brother who drinks alcohol as well.  Hospital Course:  Acute Pancreatitis - Patient's pain had resolved yesterday and he wanted to try some clear liquids-however, lipase elevated today and patient has redeveloped abdominal pain and therefore have backed off on liquids and placed him back on nothing by mouth-resumed morphine IV for pain control- need to continue nothing by mouth status today - MRCP revealed pancreatic duct stricture and CBD dilatation/ obstruction - per GI, he needs EUS/ ERCP about 2-3 wks after acute pancreatitis resolves- may need procedure sooner if pancreatitis is not resolving- cont to follow symptoms and lipase   -Per Dr. Nichola Sizer note from 7/20, there are also suspecting he may have chronic pancreatitis with stricture secondary to this- we'll consider starting pancreatic enzymes when we begin food  Active Problems:  hypotension with h/o Essential hypertension - BP in 40G systolic with bradycardia on 7/30 - holding Metoprolol- cont Hydralazine with holding parameters  Bradycardia - HR in 40s on 7/30- held Metoprolol 12.5 milligrams twice a day- rate around 50s and 60s- continue to hold metoprolol   CAD (coronary artery disease) s/p Stent in July 2015 after cardiac arrest - on plavix, Aspirin, statin   Schizophrenia - cont psych medications- affect is quite flat-poor interaction-I'm worried that he may not be taking his medications appropriately-we will need to discuss in more detail and maybe also speak with brother prior to discharge  Seizure disorder - cont Keppra  ETOH abuse -Counseled  Severe protein calorie malnutrition -We'll start supplements once he is able to eat   Appt with PCP: Code Status: Full code Family Communication:  Disposition Plan: Continue to follow in hospital under involuntary commitment DVT prophylaxis: SCDs, Plavix aspirin Consultants: none Procedures:   Antibiotics: Anti-infectives    None      Objective: [facility] Weights   08/02/14 2046 08/03/14 0233  Weight: 62.959 kg (138 lb 12.8 oz) 61.3 kg (135 lb 2.3 oz)    Intake/Output Summary (Last 24 hours) at 08/05/14 1213 Last data filed at 08/05/14 0915  Gross per 24 hour  Intake 2620.83 ml  Output   2460 ml  Net 160.83 ml     Vitals Filed Vitals:  08/04/14 0959 08/04/14 1500 08/04/14 2052 08/05/14 0604  BP: 125/71 99/57 110/63 127/78  Pulse:  57 42 50  Temp:  98 F (36.7 C) 98.1 F (36.7 C) 98.4 F (36.9 C)  TempSrc:  Oral Oral Oral  Resp:  16 14 14   Height:      Weight:      SpO2:  100% 99% 98%    Exam:  General:  Sleepy but wakable, not in acute distress  HEENT: No icterus,  No thrush, oral mucosa moist  Cardiovascular: regular rate and rhythm, S1/S2 No murmur  Respiratory: clear to auscultation bilaterally   Abdomen: Soft, +Bowel sounds, tender in right upper quadrant and mid abdomen today, non distended, no guarding  MSK: No LE edema, cyanosis or clubbing  Data Reviewed: Basic Metabolic Panel:  Recent Labs Lab 08/01/14 0515 08/02/14 2202 08/03/14 0530 08/04/14 0528 08/05/14 0410  NA 142 140 141 142 143  K 3.8 3.6 3.9 4.2 3.5  CL 110 105 110 111 111  CO2 26 25 26 24 26   GLUCOSE 79 89 78 81 76  BUN <5* <5* 6 6 6   CREATININE 0.55* 0.62 0.57* 0.55* 0.55*  CALCIUM 8.5* 9.1 8.2* 8.3* 8.5*  MG  --   --  1.9  --   --   PHOS  --   --  3.8  --   --    Liver Function Tests:  Recent Labs Lab 08/01/14 0515 08/02/14 2202 08/03/14 0530 08/04/14 0528 08/05/14 0410  AST 25 72* 72* 351* 104*  ALT 42 104* 95* 268* 171*  ALKPHOS 213* 383* 387* 445* 358*  BILITOT 0.5 0.3 0.7 0.6 0.6  PROT 5.5* 7.3 6.1* 5.9* 5.8*  ALBUMIN 3.0* 3.9 3.3* 3.1* 3.1*    Recent Labs Lab 08/01/14 0515 08/02/14 2202 08/03/14 0530 08/04/14 0528 08/05/14 0410  LIPASE 162* 197* 124* 305* 117*    Recent Labs Lab 07/31/14 0012  AMMONIA 27   CBC:  Recent Labs Lab 07/30/14 1938 07/31/14 0245 08/01/14 0515 08/02/14 2202 08/03/14 0530  WBC 7.4 6.6 4.6 5.1 4.5  NEUTROABS  --   --   --  2.6  --   HGB 12.3* 11.7* 11.5* 12.3* 11.0*  HCT 36.3* 35.3* 34.6* 36.8* 33.6*  MCV 94.5 95.7 96.1 95.8 96.6  PLT 306 264 233 293 248   Cardiac Enzymes:  Recent Labs Lab 07/31/14 0012 07/31/14 0245  TROPONINI <0.03 <0.03   BNP (last 3 results)  Recent Labs  05/01/14 2153 06/06/14 2206  BNP 21.0 28.5    ProBNP (last 3 results) No results for input(s): PROBNP in the last 8760 hours.  CBG:  Recent Labs Lab 07/31/14 1750 08/01/14 0039 08/01/14 0603 08/01/14 0755 08/01/14 1252  GLUCAP 76 87 92 82 83    Recent Results (from the past 240 hour(s))  MRSA PCR  Screening     Status: None   Collection Time: 08/03/14  3:53 AM  Result Value Ref Range Status   MRSA by PCR NEGATIVE NEGATIVE Final    Comment:        The GeneXpert MRSA Assay (FDA approved for NASAL specimens only), is one component of a comprehensive MRSA colonization surveillance program. It is not intended to diagnose MRSA infection nor to guide or monitor treatment for MRSA infections.      Studies: No results found.  Scheduled Meds:  Scheduled Meds: . aspirin  81 mg Oral Daily  . benztropine  0.5 mg Oral BID  . clopidogrel  75 mg  Oral Daily  . divalproex  500 mg Oral BID  . enoxaparin (LOVENOX) injection  40 mg Subcutaneous Q24H  . folic acid  1 mg Oral Daily  . hydrALAZINE  10 mg Oral BID  . levETIRAcetam  1,000 mg Oral BID  . multivitamin with minerals  1 tablet Oral Daily  . QUEtiapine  50 mg Oral QHS  . sodium chloride  3 mL Intravenous Q12H  . thiamine  100 mg Oral Daily   Or  . thiamine  100 mg Intravenous Daily   Continuous Infusions: . sodium chloride 125 mL/hr at 08/05/14 0602    Time spent on care of this patient: 16 min   [place], MD 08/05/2014, 12:13 PM  LOS: 2 days   [facility] Office  [phone] Pager - Text Page per [email or web address] If 7PM-7AM, please contact night-coverage [email or web address]

## 2014-08-06 LAB — COMPREHENSIVE METABOLIC PANEL
ALBUMIN: 3.2 g/dL — AB (ref 3.5–5.0)
ALK PHOS: 337 U/L — AB (ref 38–126)
ALT: 119 U/L — AB (ref 17–63)
AST: 50 U/L — AB (ref 15–41)
Anion gap: 9 (ref 5–15)
BILIRUBIN TOTAL: 1.1 mg/dL (ref 0.3–1.2)
BUN: 8 mg/dL (ref 6–20)
CO2: 23 mmol/L (ref 22–32)
Calcium: 8.3 mg/dL — ABNORMAL LOW (ref 8.9–10.3)
Chloride: 110 mmol/L (ref 101–111)
Creatinine, Ser: 0.62 mg/dL (ref 0.61–1.24)
GFR calc Af Amer: >60 mL/min (ref >60)
Glucose, Bld: 59 mg/dL — ABNORMAL LOW (ref 65–99)
Potassium: 3.4 mmol/L — ABNORMAL LOW (ref 3.5–5.1)
SODIUM: 142 mmol/L (ref 135–145)
Total Protein: 5.9 g/dL — ABNORMAL LOW (ref 6.5–8.1)

## 2014-08-06 LAB — LIPASE, BLOOD: Lipase: 90 U/L — ABNORMAL HIGH (ref 22–51)

## 2014-08-06 MED ORDER — PANCRELIPASE (LIP-PROT-AMYL) 12000-38000 UNITS PO CPEP: 12000.0000 [IU] | ORAL_CAPSULE | Freq: Three times a day (TID) | ORAL | Status: DC

## 2014-08-06 MED ORDER — POTASSIUM CHLORIDE CRYS ER 20 MEQ PO TBCR
40.0000 meq | EXTENDED_RELEASE_TABLET | ORAL | Status: AC
  Administered 2014-08-06 (×2): 40 meq via ORAL
  Filled 2014-08-06 (×2): qty 2

## 2014-08-06 MED ORDER — MORPHINE SULFATE 2 MG/ML IJ SOLN
2.0000 mg | INTRAMUSCULAR | Status: DC | PRN
  Administered 2014-08-06 (×2): 4 mg via INTRAVENOUS
  Administered 2014-08-06: 2 mg via INTRAVENOUS
  Administered 2014-08-06 – 2014-08-09 (×11): 4 mg via INTRAVENOUS
  Filled 2014-08-06 (×14): qty 2

## 2014-08-06 NOTE — Progress Notes (Signed)
[facility] Progress Note   Chase Blackwell IRS:[id number] DOB: 1965/08/14 DOA: 08/02/2014 PCP: [facility]  Brief narrative: Chase Blackwell is a 49 y.o. male who has had 7 admissions since January for acute pancreatitis. He has left AMA and number of times. He is a re-admit for acute pancreatitis. He left AMA on 7/21 and again on 7/28 when he was admitted for the same reason. He was deemed to lack capacity to make medical decisions for himself by psychiatry on 7/28. I have done paperwork to involuntarily commit him.   Past medical history includes a history of hypertension, asthma, schizoaffective disorder, alcohol abuse GERD and chronic pain syndrome. He was found unresponsive on on the sidewalk on 07/06/2013 and found to have a large MI which resulted in V. fib arrest. He was shocked multiple times. And taken emergently to the Cath Lab where he received an RCA stent. He was noted to have seizure-like activity in the rewarming period. He ended up with a tracheostomy and subsequently developed tracheobronchitis. He was transitioned to an LTAC. And then went home.  Subjective:  Complains of ongoing abdominal pain -points to the middle of his stomach. No nausea vomiting or diarrhea.  Hospital Course:  Acute Pancreatitis -He wanted to try some clear liquids told us his abdominal pain had resolved-however, lipase became elevated and patient redeveloped abdominal pain and therefore have backed off on liquids and placed him back on nothing by mouth - MRCP revealed pancreatic duct stricture and CBD dilatation/ obstruction - per GI, he needs EUS/ ERCP about 2-3 wks after acute pancreatitis resolves- may need procedure sooner if pancreatitis is not resolving- cont to follow symptoms and lipase  -Per Dr. Nichola Sizer note from 7/20, there are also suspecting he may have chronic pancreatitis with stricture secondary to this- we'll consider starting pancreatic enzymes when we begin food - Will allow  water today -If lipase improved even further tomorrow, can retry clear liquids-would advise to continue to follow lipase closely and if rising again once eating, consult GI to see if any procedure needs to be done to help correct his recurrent pancreatitis  Active Problems:  hypotension with h/o Essential hypertension - BP in 00X systolic with bradycardia on 7/30 - holding Metoprolol- cont Hydralazine with holding parameters  Bradycardia - HR in 40s on 7/30- held Metoprolol 12.5 milligrams twice a day- rate around 50s and 60s- continue to hold metoprolol   CAD (coronary artery disease) s/p Stent in July 2015 after cardiac arrest - on plavix, Aspirin, statin   Schizophrenia - cont psych medications- affect is quite flat-poor interaction-I'm worried that he may not be taking his medications appropriately-we will need to discuss in more detail and maybe also speak with brother prior to discharge  Seizure disorder - cont Keppra  ETOH abuse -Counseled  Severe protein calorie malnutrition -We'll start supplements once he is able to eat   Appt with PCP: Code Status: Full code Family Communication:  Disposition Plan: Continue to follow in hospital under involuntary commitment- he was told by his brother yesterday that his sister died-monitor for worsening depression DVT prophylaxis: SCDs, Plavix aspirin Consultants: none Procedures:   Antibiotics: Anti-infectives    None      Objective: [facility] Weights   08/02/14 2046 08/03/14 0233  Weight: 62.959 kg (138 lb 12.8 oz) 61.3 kg (135 lb 2.3 oz)    Intake/Output Summary (Last 24 hours) at 08/06/14 1826 Last data filed at 08/06/14 3818  Gross per 24 hour  Intake  1664.58 ml  Output    500 ml  Net 1164.58 ml     Vitals Filed Vitals:   08/05/14 0604 08/05/14 2037 08/06/14 0504 08/06/14 1456  BP: 127/78 138/73 136/76 115/69  Pulse: 50 57 51 64  Temp: 98.4 F (36.9 C) 98.4 F (36.9 C) 97.7 F (36.5 C) 97.4 F (36.3 C)   TempSrc: Oral Oral Oral Oral  Resp: 14 17 17 16   Height:      Weight:      SpO2: 98% 100% 98% 100%    Exam:  General:  Sleepy but wakable, not in acute distress  HEENT: No icterus, No thrush, oral mucosa moist  Cardiovascular: regular rate and rhythm, S1/S2 No murmur  Respiratory: clear to auscultation bilaterally   Abdomen: Soft, +Bowel sounds, tender in right upper quadrant and mid abdomen today, non distended, no guarding  MSK: No LE edema, cyanosis or clubbing  Data Reviewed: Basic Metabolic Panel:  Recent Labs Lab 08/02/14 2202 08/03/14 0530 08/04/14 0528 08/05/14 0410 08/06/14 0345  NA 140 141 142 143 142  K 3.6 3.9 4.2 3.5 3.4*  CL 105 110 111 111 110  CO2 25 26 24 26 23   GLUCOSE 89 78 81 76 59*  BUN <5* 6 6 6 8   CREATININE 0.62 0.57* 0.55* 0.55* 0.62  CALCIUM 9.1 8.2* 8.3* 8.5* 8.3*  MG  --  1.9  --   --   --   PHOS  --  3.8  --   --   --    Liver Function Tests:  Recent Labs Lab 08/02/14 2202 08/03/14 0530 08/04/14 0528 08/05/14 0410 08/06/14 0345  AST 72* 72* 351* 104* 50*  ALT 104* 95* 268* 171* 119*  ALKPHOS 383* 387* 445* 358* 337*  BILITOT 0.3 0.7 0.6 0.6 1.1  PROT 7.3 6.1* 5.9* 5.8* 5.9*  ALBUMIN 3.9 3.3* 3.1* 3.1* 3.2*    Recent Labs Lab 08/02/14 2202 08/03/14 0530 08/04/14 0528 08/05/14 0410 08/06/14 0345  LIPASE 197* 124* 305* 117* 90*    Recent Labs Lab 07/31/14 0012  AMMONIA 27   CBC:  Recent Labs Lab 07/30/14 1938 07/31/14 0245 08/01/14 0515 08/02/14 2202 08/03/14 0530  WBC 7.4 6.6 4.6 5.1 4.5  NEUTROABS  --   --   --  2.6  --   HGB 12.3* 11.7* 11.5* 12.3* 11.0*  HCT 36.3* 35.3* 34.6* 36.8* 33.6*  MCV 94.5 95.7 96.1 95.8 96.6  PLT 306 264 233 293 248   Cardiac Enzymes:  Recent Labs Lab 07/31/14 0012 07/31/14 0245  TROPONINI <0.03 <0.03   BNP (last 3 results)  Recent Labs  05/01/14 2153 06/06/14 2206  BNP 21.0 28.5    ProBNP (last 3 results) No results for input(s): PROBNP in the last  8760 hours.  CBG:  Recent Labs Lab 07/31/14 1750 08/01/14 0039 08/01/14 0603 08/01/14 0755 08/01/14 1252  GLUCAP 76 87 92 82 83    Recent Results (from the past 240 hour(s))  MRSA PCR Screening     Status: None   Collection Time: 08/03/14  3:53 AM  Result Value Ref Range Status   MRSA by PCR NEGATIVE NEGATIVE Final    Comment:        The GeneXpert MRSA Assay (FDA approved for NASAL specimens only), is one component of a comprehensive MRSA colonization surveillance program. It is not intended to diagnose MRSA infection nor to guide or monitor treatment for MRSA infections.      Studies: No results found.  Scheduled  Meds:  Scheduled Meds: . aspirin  81 mg Oral Daily  . benztropine  0.5 mg Oral BID  . clopidogrel  75 mg Oral Daily  . divalproex  500 mg Oral BID  . enoxaparin (LOVENOX) injection  40 mg Subcutaneous Q24H  . folic acid  1 mg Oral Daily  . hydrALAZINE  10 mg Oral BID  . levETIRAcetam  1,000 mg Oral BID  . multivitamin with minerals  1 tablet Oral Daily  . QUEtiapine  50 mg Oral QHS  . sodium chloride  3 mL Intravenous Q12H  . thiamine  100 mg Oral Daily   Or  . thiamine  100 mg Intravenous Daily   Continuous Infusions: . sodium chloride 125 mL/hr at 08/06/14 1439    Time spent on care of this patient: 85 min   Forgan, MD 08/06/2014, 6:26 PM  LOS: 3 days   [facility] Office  [phone] Pager - Text Page per [email or web address] If 7PM-7AM, please contact night-coverage [email or web address]

## 2014-08-07 DIAGNOSIS — F209 Schizophrenia, unspecified: Secondary | ICD-10-CM

## 2014-08-07 DIAGNOSIS — R001 Bradycardia, unspecified: Secondary | ICD-10-CM | POA: Insufficient documentation

## 2014-08-07 DIAGNOSIS — I952 Hypotension due to drugs: Secondary | ICD-10-CM | POA: Insufficient documentation

## 2014-08-07 DIAGNOSIS — I25810 Atherosclerosis of coronary artery bypass graft(s) without angina pectoris: Secondary | ICD-10-CM

## 2014-08-07 DIAGNOSIS — I10 Essential (primary) hypertension: Secondary | ICD-10-CM

## 2014-08-07 DIAGNOSIS — F1010 Alcohol abuse, uncomplicated: Secondary | ICD-10-CM

## 2014-08-07 DIAGNOSIS — E43 Unspecified severe protein-calorie malnutrition: Secondary | ICD-10-CM

## 2014-08-07 LAB — COMPREHENSIVE METABOLIC PANEL
ALT: 113 U/L — ABNORMAL HIGH (ref 17–63)
AST: 63 U/L — AB (ref 15–41)
Albumin: 3 g/dL — ABNORMAL LOW (ref 3.5–5.0)
Alkaline Phosphatase: 381 U/L — ABNORMAL HIGH (ref 38–126)
Anion gap: 7 (ref 5–15)
BUN: 6 mg/dL (ref 6–20)
CHLORIDE: 110 mmol/L (ref 101–111)
CO2: 26 mmol/L (ref 22–32)
CREATININE: 0.61 mg/dL (ref 0.61–1.24)
Calcium: 8.5 mg/dL — ABNORMAL LOW (ref 8.9–10.3)
GFR calc non Af Amer: >60 mL/min (ref >60)
Glucose, Bld: 70 mg/dL (ref 65–99)
POTASSIUM: 3.9 mmol/L (ref 3.5–5.1)
Sodium: 143 mmol/L (ref 135–145)
Total Bilirubin: 0.6 mg/dL (ref 0.3–1.2)
Total Protein: 5.7 g/dL — ABNORMAL LOW (ref 6.5–8.1)

## 2014-08-07 LAB — LIPASE, BLOOD: Lipase: 47 U/L (ref 22–51)

## 2014-08-07 NOTE — Progress Notes (Signed)
Chase Blackwell YPP:[id number] DOB: [birth year] DOA: 08/02/2014 PCP: [facility]  Brief narrative:  49 y/o prior V. fib cardiac arrest, history of ST elevation MI in July 2015 status post PCI to proximal and mid 100% occluded RCA using 3.033 mm long  drug-eluting stent, hypertension, history of bronchial asthma, GERD, history of schizoaffective disorder, alcohol abuse, seizure disorder 2/2 to anoxic brain injury readmitted for the 7th time 08/06/14 C/o abd pain Brother states he has been drinking at home He has very poor decision making ability and psychaitry felt he DID NOT have capacity to [facility] medical decisions when he was seen on 7/28  Past medical history-As per Problem list Chart reviewed as below-   Consultants:    Procedures:    Antibiotics:     Subjective  Alert but disoriented and cannot tell me anything about where he is and cannot orient Tells me he is hungry  Objective    Interim History:   Telemetry:    Objective: Filed Vitals:   08/06/14 1456 08/06/14 2042 08/07/14 0500 08/07/14 1401  BP: 115/69 119/63 104/66 136/73  Pulse: 64 50 50 47  Temp: 97.4 F (36.3 C) 97.6 F (36.4 C) 97.9 F (36.6 C) 97.8 F (36.6 C)  TempSrc: Oral Oral Oral Oral  Resp: 16 17 17 16   Height:      Weight:      SpO2: 100% 100% 100% 100%    Intake/Output Summary (Last 24 hours) at 08/07/14 1633 Last data filed at 08/07/14 1300  Gross per 24 hour  Intake 2565.42 ml  Output   1700 ml  Net 865.42 ml    Exam:  General: eomi, ncat Cardiovascular: s1 s 2no m/r/g Respiratory: clear no added soudn Abdomen: soft nt nd no rebound Skin no le edema Neuro: intact  Data Reviewed: Basic Metabolic Panel:  Recent Labs Lab 08/03/14 0530 08/04/14 0528 08/05/14 0410 08/06/14 0345 08/07/14 0434  NA 141 142 143 142 143  K 3.9 4.2 3.5 3.4* 3.9  CL 110 111 111 110 110  CO2 26 24 26 23 26   GLUCOSE 78 81 76 59* 70  BUN 6 6 6 8 6   CREATININE 0.57* 0.55* 0.55* 0.62  0.61  CALCIUM 8.2* 8.3* 8.5* 8.3* 8.5*  MG 1.9  --   --   --   --   PHOS 3.8  --   --   --   --    Liver Function Tests:  Recent Labs Lab 08/03/14 0530 08/04/14 0528 08/05/14 0410 08/06/14 0345 08/07/14 0434  AST 72* 351* 104* 50* 63*  ALT 95* 268* 171* 119* 113*  ALKPHOS 387* 445* 358* 337* 381*  BILITOT 0.7 0.6 0.6 1.1 0.6  PROT 6.1* 5.9* 5.8* 5.9* 5.7*  ALBUMIN 3.3* 3.1* 3.1* 3.2* 3.0*    Recent Labs Lab 08/03/14 0530 08/04/14 0528 08/05/14 0410 08/06/14 0345 08/07/14 0434  LIPASE 124* 305* 117* 90* 47   No results for input(s): AMMONIA in the last 168 hours. CBC:  Recent Labs Lab 08/01/14 0515 08/02/14 2202 08/03/14 0530  WBC 4.6 5.1 4.5  NEUTROABS  --  2.6  --   HGB 11.5* 12.3* 11.0*  HCT 34.6* 36.8* 33.6*  MCV 96.1 95.8 96.6  PLT 233 293 248   Cardiac Enzymes: No results for input(s): CKTOTAL, CKMB, CKMBINDEX, TROPONINI in the last 168 hours. BNP: Invalid input(s): POCBNP CBG:  Recent Labs Lab 07/31/14 1750 08/01/14 0039 08/01/14 0603 08/01/14 0755 08/01/14 1252  GLUCAP 76 87 92 82  83    Recent Results (from the past 240 hour(s))  MRSA PCR Screening     Status: None   Collection Time: 08/03/14  3:53 AM  Result Value Ref Range Status   MRSA by PCR NEGATIVE NEGATIVE Final    Comment:        The GeneXpert MRSA Assay (FDA approved for NASAL specimens only), is one component of a comprehensive MRSA colonization surveillance program. It is not intended to diagnose MRSA infection nor to guide or monitor treatment for MRSA infections.      Studies:              All Imaging reviewed and is as per above notation   Scheduled Meds: . aspirin  81 mg Oral Daily  . benztropine  0.5 mg Oral BID  . clopidogrel  75 mg Oral Daily  . divalproex  500 mg Oral BID  . enoxaparin (LOVENOX) injection  40 mg Subcutaneous Q24H  . folic acid  1 mg Oral Daily  . hydrALAZINE  10 mg Oral BID  . levETIRAcetam  1,000 mg Oral BID  . multivitamin with  minerals  1 tablet Oral Daily  . QUEtiapine  50 mg Oral QHS  . sodium chloride  3 mL Intravenous Q12H  . thiamine  100 mg Oral Daily   Continuous Infusions: . sodium chloride 125 mL/hr at 08/07/14 1547     Assessment/Plan: Present on Admission:  .Marland Kitchen Pancreatitis, acute 2/2 cont Alcohol abuse -start soft diet -monitor for n/v/cabd pain  . Essential hypertension -continue hydralazine 10 bid  . Common bile duct (CBD) obstruction  . Schizophrenia -lack of capacity -needs convalescence -pt/ot to see in consult -continue depakote 500 bid/keppra 1000 bid, quetiapine 50 qhs  . CAD (coronary artery disease) of artery bypass graft Continue plavix 75 qd   . Protein-calorie malnutrition, severe -drinking and smoking -wouldn't supplement unless going to a facility  Appt with PCP: Requested Code Status: Full code Family Communication: d/w family Elenore Rota [hcpoe] on phone Disposition Plan: needs placement DVT prophylaxis: SCD Consultants:   Verneita Griffes, MD  [facility] Pager [phone] 08/07/2014, 4:33 PM    LOS: 4 days

## 2014-08-07 NOTE — Progress Notes (Signed)
Restring in bed w/ eyes closed, resp. even and unlabored.will monitor

## 2014-08-08 DIAGNOSIS — R413 Other amnesia: Secondary | ICD-10-CM

## 2014-08-08 DIAGNOSIS — R001 Bradycardia, unspecified: Secondary | ICD-10-CM

## 2014-08-08 DIAGNOSIS — I952 Hypotension due to drugs: Secondary | ICD-10-CM

## 2014-08-08 LAB — COMPREHENSIVE METABOLIC PANEL
ALK PHOS: 408 U/L — AB (ref 38–126)
ALT: 150 U/L — ABNORMAL HIGH (ref 17–63)
AST: 137 U/L — AB (ref 15–41)
Albumin: 2.9 g/dL — ABNORMAL LOW (ref 3.5–5.0)
Anion gap: 6 (ref 5–15)
BUN: 8 mg/dL (ref 6–20)
CALCIUM: 8.3 mg/dL — AB (ref 8.9–10.3)
CO2: 27 mmol/L (ref 22–32)
Chloride: 109 mmol/L (ref 101–111)
Creatinine, Ser: 0.54 mg/dL — ABNORMAL LOW (ref 0.61–1.24)
GFR calc non Af Amer: >60 mL/min (ref >60)
Glucose, Bld: 85 mg/dL (ref 65–99)
POTASSIUM: 3.5 mmol/L (ref 3.5–5.1)
Sodium: 142 mmol/L (ref 135–145)
TOTAL PROTEIN: 5.5 g/dL — AB (ref 6.5–8.1)
Total Bilirubin: 0.7 mg/dL (ref 0.3–1.2)

## 2014-08-08 LAB — LIPASE, BLOOD: Lipase: 53 U/L — ABNORMAL HIGH (ref 22–51)

## 2014-08-08 MED ORDER — ENSURE ENLIVE PO LIQD
237.0000 mL | Freq: Two times a day (BID) | ORAL | Status: DC
  Administered 2014-08-08 – 2014-08-09 (×4): 237 mL via ORAL

## 2014-08-08 NOTE — Care Management Important Message (Signed)
Important Message  Patient Details  Name: SHUBH CHIARA MRN: [id number] Date of Birth: 1965-03-19   Medicare Important Message Given:  Yes-third notification given    Camillo Flaming 08/08/2014, 1:39 [place] Message  Patient Details  Name: URIJAH ARKO MRN: [id number] Date of Birth: 04-Nov-1965   Medicare Important Message Given:  Yes-third notification given    Camillo Flaming 08/08/2014, 1:39 PM

## 2014-08-08 NOTE — Care Management Note (Signed)
Case Management Note  Patient Details  Name: Chase Blackwell MRN: [id number] Date of Birth: 10/23/1965  Subjective/Objective:  IVC-unable to make decisions. PT-no f/u. CSW following.                 Action/Plan:d/c plan home.   Expected Discharge Date:                  Expected Discharge Plan:  Home/Self Care  In-House Referral:     Discharge planning Services  CM Consult  Post Acute Care Choice:    Choice offered to:     DME Arranged:    DME Agency:     HH Arranged:    HH Agency:     Status of Service:  In process, will continue to follow  Medicare Important Message Given:  Yes-second notification given Date Medicare IM Given:    Medicare IM give by:    Date Additional Medicare IM Given:    Additional Medicare Important Message give by:     If discussed at [place] of Stay Meetings, dates discussed:    Additional Comments:  Dessa Phi, RN 08/08/2014, 1:12 PM

## 2014-08-08 NOTE — Clinical Social Work Placement (Signed)
CSW spoke with patient's brother, Elenore Rota (cell#: [phone]) re: discharge planning. CSW faxed information out to [place] from [place] to come out and assess patient in the morning. Dr. Verlon Au aware. CSW will follow-up in the morning.     Raynaldo Opitz, [facility] Clinical Social Worker cell #: [phone]    CLINICAL SOCIAL WORK PLACEMENT  NOTE  Date:  08/08/2014  Patient Details  Name: Chase Blackwell MRN: [id number] Date of Birth: Sep 29, 1965  Clinical Social Work is seeking post-discharge placement for this patient at the [place] level of care (*CSW will initial, date and re-position this form in  chart as items are completed):  Yes   Patient/family provided with [place] Work Department's list of facilities offering this level of care within the geographic area requested by the patient (or if unable, by the patient's family).  Yes   Patient/family informed of their freedom to choose among providers that offer the needed level of care, that participate in Medicare, Medicaid or managed care program needed by the patient, have an available bed and are willing to accept the patient.  Yes   Patient/family informed of [facility]'s ownership interest in [facility] and [facility], as well as of the fact that they are under no obligation to receive care at these facilities.  PASRR submitted to EDS on 08/08/14     PASRR number received on       Existing PASRR number confirmed on       FL2 transmitted to all facilities in geographic area requested by pt/family on 08/08/14     FL2 transmitted to all facilities within larger geographic area on 08/08/14     Patient informed that his/her managed care company has contracts with or will negotiate with certain facilities, including the following:            Patient/family informed of bed offers received.  Patient chooses  bed at       Physician recommends and patient chooses bed at      Patient to be transferred to   on  .  Patient to be transferred to facility by       Patient family notified on   of transfer.  Name of family member notified:        PHYSICIAN       Additional Comment:    _______________________________________________ Standley Brooking, LCSW 08/08/2014, 4:58 PM

## 2014-08-08 NOTE — Consult Note (Signed)
[facility] Face-to-Face Psychiatry Consult   Reason for Consult:  Capacity evaluation Referring Physician:  Dr. Verlon Au Patient Identification: Chase Blackwell MRN:  [id number] Principal Diagnosis: Pancreatitis, acute Diagnosis:   Patient Active Problem List   Diagnosis Date Noted  . Hypotension due to drugs [I95.2]   . Bradycardia [R00.1]   . Protein-calorie malnutrition, severe [E43] 08/05/2014  . Lower abdominal pain [R10.30]   . Common bile duct (CBD) obstruction [K83.1]   . Common bile duct obstruction [K83.1] 07/18/2014  . Lice infestation [Y40.3] 07/18/2014  . Choledocholithiasis with obstruction [K80.51] 07/18/2014  . Dehydration [E86.0] 07/18/2014  . Elevated LFTs [R79.89]   . Alcohol-induced acute pancreatitis [K85.2]   . Convulsion [R56.9]   . Abdominal pain, epigastric [R10.13] 07/01/2014  . Anemia [D64.9] 07/01/2014  . Maxillary sinusitis, acute [J01.00] 06/11/2014  . Acute non-Q-wave MI <8 weeks prior, subsequent admission after initial [I22.9, I21.4] 06/11/2014  . Sinusitis, acute maxillary [J01.00] 06/10/2014  . Seizure disorder [G40.909] 06/09/2014  . Hyponatremia [E87.1]   . Chest pain [R07.9] 06/07/2014  . Protein calorie malnutrition [E46] 06/07/2014  . Pain in the chest [R07.9]   . Schizophrenia [F20.9]   . Pancreatitis, acute [K85.9] 01/22/2014  . CHF (congestive heart failure) [I50.9] 01/17/2014  . Tobacco abuse [Z72.0] 01/17/2014  . 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 CAD (coronary artery disease) [I25.10] 01/16/2014  . Acute pancreatitis [K85.9] 01/15/2014  . Anoxic brain injury [G93.1] 09/01/2013  . Seizures [R56.9] 09/01/2013  . Acute tracheobronchitis [J00] 09/01/2013  . PEG (percutaneous endoscopic gastrostomy) status [Z93.1] 09/01/2013  . Altered mental status [R41.82] 07/09/2013  . Iatrogenic pneumothorax [J95.811] 07/07/2013  . Cardiac arrest [I46.9] 07/06/2013  . ST elevation myocardial infarction (STEMI)  of inferior wall, initial episode of care [I21.19] 07/06/2013  . Acute respiratory failure [J96.00] 07/06/2013  . HYPERCHOLESTEROLEMIA [E78.0] 06/05/2008  . 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 Alcohol abuse [F10.10] 06/05/2008  . CANNABIS ABUSE [F12.10] 06/05/2008  . DEPRESSION [F32.9] 06/05/2008  . Essential hypertension [I10] 06/05/2008  . BRONCHITIS [J40] 06/05/2008  . RECTAL BLEEDING [K62.5] 06/05/2008    Total Time spent with patient: 1 hour  Subjective:   Chase Blackwell is a 49 y.o. male patient admitted with abdominal pain.  HPI:  Chase Blackwell is a 49 y.o. male seen face-to-face for psychiatric consultation and evaluation along with psychiatric social service. Patient appeared lying down in his bed, is awake and alert. Patient stated that his sister from [facility] died about 3 days ago as per one of his brother told him. Patient also used to stay in [facility] in the past but does not tell you why he was not stating that any longer except they do not take him any longer. Patient stated he has been depressed and also tearful but denies current suicidal/homicidal ideation, intention or plans. Patient has no evidence of psychosis. Patient reported that he has been living with his brother and able to care for himself leg and bathing and eating. Patient also knows that he has been disabled but cannot explain what made him disabled except repeating himself him disabled. Patient does not remember the name of the hospital, year month or date. Patient reported he has been forgetful and not able to remember a lot of details. Reportedly has been dependent on his brother to make his medical decisions and living arrangements at this time. Will ask's psychiatric social service to contact family members regarding appropriate disposition plans for him. He has  history of schizophrenia has been seen in the past by psychiatry. Patient has no evidence of DTs, He has history of seizure disorder  and has been continued on his Keppra.   HPI Elements:   Location:  alcohol abuse and memory loss. Quality:  fair to poor. Severity:  chronic and recurrent abdominal pain]. Timing:  unknown. Duration:  few weeks. Context:  Chronic medical problems, memory deficits.  Past Medical History:  Past Medical History  Diagnosis Date  . Hypertension   . Asthma   . GERD (gastroesophageal reflux disease)   . Chronic pain   . Schizophrenia   . Alcohol abuse   . Seizures 07/2013    seizure like activity following cardiac/resp arrest.   . 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 Depression   . CAD (coronary artery disease)   . Cardiac arrest 07/2013    with acute MI and secondary respiratory arrest  . Protein calorie malnutrition 07/2013    Past Surgical History  Procedure Laterality Date  . Left heart catheterization with coronary angiogram N/A 07/06/2013    Procedure: LEFT HEART CATHETERIZATION WITH CORONARY ANGIOGRAM;  Surgeon: Mohan N Harwani, MD;  Location: [facility] CATH [facility];  Service: Cardiovascular;  Laterality: N/A;  . Percutaneous coronary stent intervention (pci-s)  07/06/2013    Procedure: PERCUTANEOUS CORONARY STENT INTERVENTION (PCI-S);  Surgeon: Mohan N Harwani, MD;  Location: [facility] CATH [facility];  Service: Cardiovascular;;  . Tracheostomy  07/2013   Family History:  Family History  Problem Relation Age of Onset  . Malignant hyperthermia Mother   . Cirrhosis Father   . Alcohol abuse Father    Social History:  History  Alcohol Use  . Yes    Comment: daily heavy drinker     History  Drug Use  . Yes  . Special: Marijuana    History   Social History  . Marital Status: Single    Spouse Name: N/A  . Number of Children: N/A  . Years of Education: N/A   Social History Main Topics  . Smoking status: Current Every Day Smoker -- 0.00 packs/day    Types: Cigarettes  . Smokeless tobacco: Not on file  . Alcohol Use: Yes     Comment: daily heavy drinker  . Drug Use: Yes     Special: Marijuana  . Sexual Activity: Not on file   Other Topics Concern  . None   Social History Narrative   Additional Social History:                          Allergies:   Allergies  Allergen Reactions  . Hctz [Hydrochlorothiazide] Other (See Comments)    Dizzy spells  . Hydroxyzine Hives  . Sulfonamide Derivatives Hives  . Cetirizine & Related Other (See Comments)    Brothers were not aware of this allergy    Labs:  Results for orders placed or performed during the hospital encounter of 08/02/14 (from the past 48 hour(s))  Lipase, blood     Status: None   Collection Time: 08/07/14  4:34 AM  Result Value Ref Range   Lipase 47 22 - 51 U/L  Comprehensive metabolic panel     Status: Abnormal   Collection Time: 08/07/14  4:34 AM  Result Value Ref Range   Sodium 143 135 - 145 mmol/L   Potassium 3.9 3.5 - 5.1 mmol/L   Chloride 110 101 - 111 mmol/L   CO2 26 22 - 32 mmol/L     Glucose, Bld 70 65 - 99 mg/dL   BUN 6 6 - 20 mg/dL   Creatinine, Ser 0.61 0.61 - 1.24 mg/dL   Calcium 8.5 (L) 8.9 - 10.3 mg/dL   Total Protein 5.7 (L) 6.5 - 8.1 g/dL   Albumin 3.0 (L) 3.5 - 5.0 g/dL   AST 63 (H) 15 - 41 U/L   ALT 113 (H) 17 - 63 U/L   Alkaline Phosphatase 381 (H) 38 - 126 U/L   Total Bilirubin 0.6 0.3 - 1.2 mg/dL   GFR calc non Af Amer >60 >60 mL/min   GFR calc Af Amer >60 >60 mL/min    Comment: (NOTE) The eGFR has been calculated using the CKD EPI equation. This calculation has not been validated in all clinical situations. eGFR's persistently <60 mL/min signify possible Chronic Kidney Disease.    Anion gap 7 5 - 15  Lipase, blood     Status: Abnormal   Collection Time: 08/08/14  3:32 AM  Result Value Ref Range   Lipase 53 (H) 22 - 51 U/L  Comprehensive metabolic panel     Status: Abnormal   Collection Time: 08/08/14  3:32 AM  Result Value Ref Range   Sodium 142 135 - 145 mmol/L   Potassium 3.5 3.5 - 5.1 mmol/L   Chloride 109 101 - 111 mmol/L   CO2 27 22 - 32  mmol/L   Glucose, Bld 85 65 - 99 mg/dL   BUN 8 6 - 20 mg/dL   Creatinine, Ser 0.54 (L) 0.61 - 1.24 mg/dL   Calcium 8.3 (L) 8.9 - 10.3 mg/dL   Total Protein 5.5 (L) 6.5 - 8.1 g/dL   Albumin 2.9 (L) 3.5 - 5.0 g/dL   AST 137 (H) 15 - 41 U/L   ALT 150 (H) 17 - 63 U/L   Alkaline Phosphatase 408 (H) 38 - 126 U/L   Total Bilirubin 0.7 0.3 - 1.2 mg/dL   GFR calc non Af Amer >60 >60 mL/min   GFR calc Af Amer >60 >60 mL/min    Comment: (NOTE) The eGFR has been calculated using the CKD EPI equation. This calculation has not been validated in all clinical situations. eGFR's persistently <60 mL/min signify possible Chronic Kidney Disease.    Anion gap 6 5 - 15    Vitals: Blood pressure 132/76, pulse 50, temperature 97.7 F (36.5 C), temperature source Oral, resp. rate 16, height 6' (1.829 m), weight 61.3 kg (135 lb 2.3 oz), SpO2 100 %.  Risk to Self: Is patient at risk for suicide?: No Risk to Others:   Prior Inpatient Therapy:   Prior Outpatient Therapy:    Current Facility-Administered Medications  Medication Dose Route Frequency Provider Last Rate Last Dose  . 0.9 %  sodium chloride infusion   Intravenous Continuous Saima Rizwan, MD 125 mL/hr at 08/08/14 0627    . acetaminophen (TYLENOL) tablet 650 mg  650 mg Oral Q6H PRN Anastassia Doutova, MD   650 mg at 08/03/14 2230   Or  . acetaminophen (TYLENOL) suppository 650 mg  650 mg Rectal Q6H PRN Anastassia Doutova, MD      . aspirin chewable tablet 81 mg  81 mg Oral Daily Anastassia Doutova, MD   81 mg at 08/08/14 0957  . benztropine (COGENTIN) tablet 0.5 mg  0.5 mg Oral BID Anastassia Doutova, MD   0.5 mg at 08/08/14 0957  . clopidogrel (PLAVIX) tablet 75 mg  75 mg Oral Daily Anastassia Doutova, MD   75 mg at 08/08/14 0750  .   divalproex (DEPAKOTE) DR tablet 500 mg  500 mg Oral BID Anastassia Doutova, MD   500 mg at 08/08/14 0957  . enoxaparin (LOVENOX) injection 40 mg  40 mg Subcutaneous Q24H Saima Rizwan, MD   40 mg at 08/08/14 0957  .  feeding supplement (ENSURE ENLIVE) (ENSURE ENLIVE) liquid 237 mL  237 mL Oral BID BM Jessica M Ostheim, RD      . folic acid (FOLVITE) tablet 1 mg  1 mg Oral Daily Anastassia Doutova, MD   1 mg at 08/08/14 0957  . hydrALAZINE (APRESOLINE) tablet 10 mg  10 mg Oral BID Saima Rizwan, MD   10 mg at 08/08/14 0957  . levETIRAcetam (KEPPRA) tablet 1,000 mg  1,000 mg Oral BID Anastassia Doutova, MD   1,000 mg at 08/08/14 0957  . morphine 2 MG/ML injection 2-4 mg  2-4 mg Intravenous Q4H PRN Saima Rizwan, MD   4 mg at 08/08/14 1016  . multivitamin with minerals tablet 1 tablet  1 tablet Oral Daily Anastassia Doutova, MD   1 tablet at 08/08/14 0957  . ondansetron (ZOFRAN) tablet 4 mg  4 mg Oral Q6H PRN Anastassia Doutova, MD       Or  . ondansetron (ZOFRAN) injection 4 mg  4 mg Intravenous Q6H PRN Anastassia Doutova, MD      . QUEtiapine (SEROQUEL) tablet 50 mg  50 mg Oral QHS Anastassia Doutova, MD   50 mg at 08/07/14 2045  . sodium chloride 0.9 % injection 3 mL  3 mL Intravenous Q12H Anastassia Doutova, MD   3 mL at 08/03/14 0228  . thiamine (VITAMIN B-1) tablet 100 mg  100 mg Oral Daily Anastassia Doutova, MD   100 mg at 08/08/14 0957    Musculoskeletal: Strength & Muscle Tone: decreased Gait & Station: unable to stand Patient leans: N/A  Psychiatric Specialty Exam: Physical Exam as per history and physical   ROS complaints about abdominal pain, memory loss but denied nausea or vomiting, chest pain and shortness of breath No Fever-chills, No Headache, No changes with Vision or hearing, reports vertigo No problems swallowing food or Liquids, No Chest pain, Cough or Shortness of Breath, No Abdominal pain, No Nausea or Vommitting, Bowel movements are regular, No Blood in stool or Urine, No dysuria, No new skin rashes or bruises, No new joints pains-aches,  No new weakness, tingling, numbness in any extremity, No recent weight gain or loss, No polyuria, polydypsia or polyphagia,   A full 10  point Review of Systems was done, except as stated above, all other Review of Systems were negative.  Blood pressure 132/76, pulse 50, temperature 97.7 F (36.5 C), temperature source Oral, resp. rate 16, height 6' (1.829 m), weight 61.3 kg (135 lb 2.3 oz), SpO2 100 %.Body mass index is 18.32 kg/(m^2).  General Appearance: Disheveled and Guarded  Eye Contact::  Fair  Speech:  Blocked and Clear and Coherent  Volume:  Decreased  Mood:  Anxious and Depressed  Affect:  Constricted, Depressed and Tearful  Thought Process:  Coherent  Orientation:  Full (Time, Place, and Person)  Thought Content:  Rumination  Suicidal Thoughts:  No  Homicidal Thoughts:  No  Memory:  Immediate;   Fair Recent;   Poor  Judgement:  Impaired  Insight:  Lacking  Psychomotor Activity:  Decreased  Concentration:  Fair  Recall:  Poor  Fund of Knowledge:Fair  Language: Fair  Akathisia:  Negative  Handed:  Right  AIMS (if indicated):     Assets:  Communication   Skills Desire for Improvement Financial Resources/Insurance Housing Intimacy Leisure Time Resilience Social Support  ADL's:  Intact  Cognition: Impaired,  Moderate  Sleep:      Medical Decision Making: Review of Psycho-Social Stressors (1), Review or order clinical lab tests (1), Established Problem, Worsening (2), Review of Last Therapy Session (1), Review or order medicine tests (1), Review of Medication Regimen & Side Effects (2) and Review of New Medication or Change in Dosage (2)  Treatment Plan Summary: Patient has been suffering with alcoholic chronic pancreatitis currently sober from drinking alcohol and continue to have significant memory deficits and recurrent physical sickness. Patient denies suicidal/homicidal ideation, intent intention or plans. Patient does not appear to be responding to internal stimuli. Daily contact with patient to assess and evaluate symptoms and progress in treatment and Medication management  Plan:  Patient does  not meet criteria for capacity to make his own medical decisions or living arrangements. Patient Brother had medical care power of attorney and should contact him regarding aftercare plans Recommended no changes in psych appropriate medication at this time Patient does not meet criteria for psychiatric inpatient admission. Supportive therapy provided about ongoing stressors.  Appreciate psychiatric consultation and sign off at this time Please contact [phone] or [phone] if needs further assistance   Disposition: Benefit from memory unit of skilled nursing facility if family was not able to care for him  Clever Geraldo,JANARDHAHA R. 08/08/2014 10:31 AM

## 2014-08-08 NOTE — Progress Notes (Signed)
Nutrition Follow-up  DOCUMENTATION CODES:   Severe malnutrition in context of social or environmental circumstances, Underweight  INTERVENTION:  - Will order Ensure Enlive BID, each supplement provides 350 kcal and 20 grams of protein - RD will continue to monitor for needs  NUTRITION DIAGNOSIS:   Inadequate oral intake related to inability to eat as evidenced by NPO status. -resolving with diet advancement and 100% breakfast completion this AM  GOAL:   Patient will meet greater than or equal to 90% of their needs -unmet currently  MONITOR:   Diet advancement, Weight trends, Labs  ASSESSMENT:  49 y.o. Male has a past medical history of Hypertension; Asthma; GERD (gastroesophageal reflux disease); Chronic pain; Schizophrenia; Alcohol abuse; Seizures (07/2013); Pancreatitis (01/2014); Depression; CAD (coronary artery disease); Cardiac arrest (07/2013); and Protein calorie malnutrition (07/2013).   Presented with abdominal pain. Patient have been seen repeatedly recently for abdominal pain and was diagnosed with pancreatitis and admitted on 14 for July. MRCP at that time showed dilatation of common bile duct and a broken narrowing of the ampulla. The plan was initially for patient to undergo EUS but he left AMA. Patient presented back on 25th of July. The same abdominal pain he was admitted again. Patient improved after rehydration his diet was advanced and he was seen by GI and their recommendations was for him to undergo repeat CT scan after resolution of pancreatitis as well as still under go outpatient EUS/ERCP in 2-3 weeks after pancreatitis has resolved. Unfortunately patient left AMA again.   Pt's diet advanced from NPO to Soft yesterday at 1346 and to Regular at 0735 today. No documented intakes from yesterday but documented 100% completion of breakfast this AM. Pt sleeping at time of visit; RD did not feel it was necessary to awake him at this time. Per rounds yesterday, pt with some  forgetfulness noted.  Will order Ensure Enlive BID to supplement given recent diet advancement and malnutrition. Not meeting needs due to recent diet advancement. Medications reviewed. Labs reviewed; creatinine low, Ca: 8.3 mg/dL, LFTs elevated.  Diet Order:  Diet regular Room service appropriate?: Yes; Fluid consistency:: Thin  Skin:  Wound (see comment) (R foot wound)  Last BM:  PTA  Height:   Ht Readings from Last 1 Encounters:  08/03/14 6' (1.829 m)    Weight:   Wt Readings from Last 1 Encounters:  08/03/14 135 lb 2.3 oz (61.3 kg)    Ideal Body Weight:  80.91 kg (kg)  BMI:  Body mass index is 18.32 kg/(m^2).  Estimated Nutritional Needs:   Kcal:  2000-2200  Protein:  100-110 grams  Fluid:  2-2.2 L/day  EDUCATION NEEDS:   No education needs identified at this time     Jarome Matin, RD, LDN Inpatient Clinical Dietitian Pager # [phone] After hours/weekend pager # [phone]

## 2014-08-08 NOTE — Evaluation (Signed)
Physical Therapy Evaluation Patient Details Name: CHESLEY VEASEY MRN: [id number] DOB: 10-16-1965 Today's Date: 08/08/2014   History of Present Illness  49 yo male admitted with pancreatits.   Clinical Impression  On eval, pt was Mod Ind with mobility-walked ~350. No LOB during session. Do not feel pt has any acute PT needs. Will sign off.     Follow Up Recommendations No PT follow up    Equipment Recommendations  None recommended by PT    Recommendations for Other Services       Precautions / Restrictions Precautions Precautions: None Restrictions Weight Bearing Restrictions: No      Mobility  Bed Mobility Overal bed mobility: Independent                Transfers Overall transfer level: Independent                  Ambulation/Gait Ambulation/Gait assistance: Modified independent (Device/Increase time) Ambulation Distance (Feet): 350 Feet Assistive device: None Gait Pattern/deviations: Step-through pattern;Decreased stride length     General Gait Details: No LOB. slow but steady gait speed..   Stairs            Wheelchair Mobility    Modified Rankin (Stroke Patients Only)       Balance Overall balance assessment: No apparent balance deficits (not formally assessed)                                           Pertinent Vitals/Pain Pain Assessment: Faces Faces Pain Scale: Hurts little more Pain Location: abdomen Pain Descriptors / Indicators: Sore Pain Intervention(s): Monitored during session    Home Living Family/patient expects to be discharged to:: Private residence Living Arrangements: Other relatives               Additional Comments: no family present during session- unsure if pt lives with his brother or he is homeless?    Prior Function Level of Independence: Independent with assistive device(s)               Hand Dominance        Extremity/Trunk Assessment   Upper Extremity Assessment:  Overall WFL for tasks assessed           Lower Extremity Assessment: Overall WFL for tasks assessed      Cervical / Trunk Assessment: Normal  Communication   Communication: No difficulties  Cognition Arousal/Alertness: Awake/alert Behavior During Therapy: WFL for tasks assessed/performed Overall Cognitive Status: Within Functional Limits for tasks assessed                      General Comments      Exercises        Assessment/Plan    PT Assessment Patent does not need any further PT services  PT Diagnosis     PT Problem List    PT Treatment Interventions     PT Goals (Current goals can be found in the Care Plan section) Acute Rehab PT Goals Patient Stated Goal: none stated PT Goal Formulation: All assessment and education complete, DC therapy    Frequency     Barriers to discharge        Co-evaluation               End of Session Equipment Utilized During Treatment: Gait belt Activity Tolerance: Patient tolerated treatment well Patient left: in  bed;with call bell/phone within reach;with bed alarm set           Time: 1026-1040 PT Time Calculation (min) (ACUTE ONLY): 14 min   Charges:   PT Evaluation $Initial PT Evaluation Tier I: 1 Procedure     PT G Codes:        Weston Anna, MPT Pager: [phone]

## 2014-08-08 NOTE — Progress Notes (Signed)
Pt states increasing abdominal pain when eating.  MD made aware, will continue to monitor closely.

## 2014-08-08 NOTE — Clinical Social Work Psych Assess (Signed)
Clinical Social Work Nature conservation officer  Clinical Social Worker:  Boone Master, [place] Date/Time:  08/08/2014, 3:24 PM Referred By:  Physician Date Referred:  08/08/14 Reason for Referral:  Competency/Guardianship   Presenting Symptoms/Problems  Presenting Symptoms/Problems(in person's/family's own words):  Psych consulted to evaluate for capacity.    Abuse/Neglect/Trauma History  Abuse/Neglect/Trauma History:  Denies History Abuse/Neglect/Trauma History Comments (indicate dates):  N/A   Psychiatric History  Psychiatric History:  Outpatient Treatment Psychiatric Medication:  Cogentin, Seroquel, Depakote   Current Mental Health Hospitalizations/Previous Mental Health History:  Patient has a previous diagnosis of schizophrenia.   Current Provider:  Unknown at this time Place and Date:  N/A  Current Medications:   Scheduled Meds: . aspirin  81 mg Oral Daily  . benztropine  0.5 mg Oral BID  . clopidogrel  75 mg Oral Daily  . divalproex  500 mg Oral BID  . enoxaparin (LOVENOX) injection  40 mg Subcutaneous Q24H  . feeding supplement (ENSURE ENLIVE)  237 mL Oral BID BM  . folic acid  1 mg Oral Daily  . hydrALAZINE  10 mg Oral BID  . levETIRAcetam  1,000 mg Oral BID  . multivitamin with minerals  1 tablet Oral Daily  . QUEtiapine  50 mg Oral QHS  . sodium chloride  3 mL Intravenous Q12H  . thiamine  100 mg Oral Daily   Continuous Infusions: . sodium chloride 125 mL/hr at 08/08/14 1518   PRN Meds:.acetaminophen **OR** acetaminophen, morphine injection, ondansetron **OR** ondansetron (ZOFRAN) IV     Previous Inpatient Admission/Date/Reason:  None reported   Emotional Health/Current Symptoms  Suicide/Self Harm: None Reported Suicide Attempt in Past (date/description):  Patient denies SI or HI. Patient denies any previous attempts.  Other Harmful Behavior (ex. homicidal ideation) (describe):  N/A   Psychotic/Dissociative  Symptoms  Psychotic/Dissociative Symptoms: None Reported Other Psychotic/Dissociative Symptoms:  N/A   Attention/Behavioral Symptoms  Attention/Behavioral Symptoms: Inattentive Other Attention/Behavioral Symptoms:  Patient confused and disoriented.   Cognitive Impairment  Cognitive Impairment:  Orientation - Self Other Cognitive Impairment:  Patient only oriented to self at this time.   Mood and Adjustment  Mood and Adjustment:  Flat   Stress, Anxiety, Trauma, Any Recent Loss/Stressor  Stress, Anxiety, Trauma, Any Recent Loss/Stressor: None Reported Anxiety (frequency):  N/A  Phobia (specify):  N/A  Compulsive Behavior (specify):  N/A  Obsessive Behavior (specify):  N/A  Other Stress, Anxiety, Trauma, Any Recent Loss/Stressor:  N/A   Substance Abuse/Use  Substance Abuse/Use: Current Substance Use SBIRT Completed (please refer for detailed history): No Self-reported Substance Use (last use and frequency):  Patient denies any current alcohol use but per chart review, patient has chronic alcohol use.  Urinary Drug Screen Completed: Yes Alcohol Level:  45   Environment/Housing/Living Arrangement  Environmental/Housing/Living Arrangement: Stable Housing Who is in the Home:  Brother  Emergency Contact:  Firefighter  Financial: Medicare, Medicaid   Patient's Strengths and Goals  Patient's Strengths and Goals (patient's own words):  Patient reports supportive family.   Clinical Social Worker's Interpretive Summary  Clinical Social Workers Interpretive Summary:    CSW received referral to complete psychosocial assessment. CSW reviewed chart and met with patient at bedside with psych MD. Patient reports that he currently lives at home with brother Chase Blackwell). Patient reports he is sad because his sister passed away 3 days ago. Psych MD evaluated capacity and determined that patient does not have capacity to make decisions. Patient is unaware of  where he is,  the date or year, and does not understand his medical problems. Patient reports he stayed at [facility]) in the past but is currently staying with brother who assists him as needed. Patient is agreeable to return to SNF if needed.   Patient denies any SI or HI and contracts for safety. Patient denies any current SA. Unit CSW can assist with placement if family desires.    Disposition  Disposition: Recommend Psych CSW Continuing To Support While In [facility] [place]

## 2014-08-08 NOTE — Progress Notes (Signed)
Chase Blackwell RFF:[id number] DOB: 1965/04/07 DOA: 08/02/2014 PCP: [facility]  Brief narrative:  49 y/o prior V. fib cardiac arrest, history of ST elevation MI in July 2015 status post PCI to proximal and mid 100% occluded RCA using 3.033 mm long  drug-eluting stent, hypertension, history of bronchial asthma, GERD, history of schizoaffective disorder, alcohol abuse, seizure disorder 2/2 to anoxic brain injury readmitted for the 7th time 08/06/14 C/o abd pain Brother states he has been drinking at home He has very poor decision making ability and psychaitry felt he DID NOT have capacity to make medical decisions when he was seen on 7/28  Past medical history-As per Problem list Chart reviewed as below-   Consultants:    Procedures:    Antibiotics:     Subjective   Eating well No cp No abd pain No n/v   Objective    Interim History:   Telemetry:    Objective: Filed Vitals:   08/07/14 1401 08/07/14 2106 08/08/14 0455 08/08/14 1326  BP: 136/73 127/71 132/76 138/78  Pulse: 47 49 50 45  Temp: 97.8 F (36.6 C) 97.8 F (36.6 C) 97.7 F (36.5 C) 97.5 F (36.4 C)  TempSrc: Oral Oral Oral Oral  Resp: 16 20 16 16   Height:      Weight:      SpO2: 100% 100% 100% 100%    Intake/Output Summary (Last 24 hours) at 08/08/14 1412 Last data filed at 08/08/14 1203  Gross per 24 hour  Intake 4337.08 ml  Output   1725 ml  Net 2612.08 ml    Exam:  General: eomi, ncat Cardiovascular: s1 s 2no m/r/g Respiratory: clear no added sound Abdomen: soft nt nd no rebound Skin no le edema Neuro: intact  Data Reviewed: Basic Metabolic Panel:  Recent Labs Lab 08/03/14 0530 08/04/14 0528 08/05/14 0410 08/06/14 0345 08/07/14 0434 08/08/14 0332  NA 141 142 143 142 143 142  K 3.9 4.2 3.5 3.4* 3.9 3.5  CL 110 111 111 110 110 109  CO2 26 24 26 23 26 27   GLUCOSE 78 81 76 59* 70 85  BUN 6 6 6 8 6 8   CREATININE 0.57* 0.55* 0.55* 0.62 0.61 0.54*  CALCIUM 8.2* 8.3*  8.5* 8.3* 8.5* 8.3*  MG 1.9  --   --   --   --   --   PHOS 3.8  --   --   --   --   --    Liver Function Tests:  Recent Labs Lab 08/04/14 0528 08/05/14 0410 08/06/14 0345 08/07/14 0434 08/08/14 0332  AST 351* 104* 50* 63* 137*  ALT 268* 171* 119* 113* 150*  ALKPHOS 445* 358* 337* 381* 408*  BILITOT 0.6 0.6 1.1 0.6 0.7  PROT 5.9* 5.8* 5.9* 5.7* 5.5*  ALBUMIN 3.1* 3.1* 3.2* 3.0* 2.9*    Recent Labs Lab 08/04/14 0528 08/05/14 0410 08/06/14 0345 08/07/14 0434 08/08/14 0332  LIPASE 305* 117* 90* 47 53*   No results for input(s): AMMONIA in the last 168 hours. CBC:  Recent Labs Lab 08/02/14 2202 08/03/14 0530  WBC 5.1 4.5  NEUTROABS 2.6  --   HGB 12.3* 11.0*  HCT 36.8* 33.6*  MCV 95.8 96.6  PLT 293 248   Cardiac Enzymes: No results for input(s): CKTOTAL, CKMB, CKMBINDEX, TROPONINI in the last 168 hours. BNP: Invalid input(s): POCBNP CBG: No results for input(s): GLUCAP in the last 168 hours.  Recent Results (from the past 240 hour(s))  MRSA PCR Screening  Status: None   Collection Time: 08/03/14  3:53 AM  Result Value Ref Range Status   MRSA by PCR NEGATIVE NEGATIVE Final    Comment:        The GeneXpert MRSA Assay (FDA approved for NASAL specimens only), is one component of a comprehensive MRSA colonization surveillance program. It is not intended to diagnose MRSA infection nor to guide or monitor treatment for MRSA infections.      Studies:              All Imaging reviewed and is as per above notation   Scheduled Meds: . aspirin  81 mg Oral Daily  . benztropine  0.5 mg Oral BID  . clopidogrel  75 mg Oral Daily  . divalproex  500 mg Oral BID  . enoxaparin (LOVENOX) injection  40 mg Subcutaneous Q24H  . feeding supplement (ENSURE ENLIVE)  237 mL Oral BID BM  . folic acid  1 mg Oral Daily  . hydrALAZINE  10 mg Oral BID  . levETIRAcetam  1,000 mg Oral BID  . multivitamin with minerals  1 tablet Oral Daily  . QUEtiapine  50 mg Oral QHS  .  sodium chloride  3 mL Intravenous Q12H  . thiamine  100 mg Oral Daily   Continuous Infusions: . sodium chloride 125 mL/hr at 08/08/14 4628     Assessment/Plan: Present on Admission:  .Pancreatitis, acute 2/2 cont Alcohol abuse -continue diet -monitor for n/v/cabd pain  . Essential hypertension -continue hydralazine 10 bid  . Common bile duct (CBD) obstruction  . Schizophrenia -lack of capacity -needs convalescence -pt/ot to see in consult -continue depakote 500 bid/keppra 1000 bid, quetiapine 50 qhs  . CAD (coronary artery disease) of artery bypass graft Continue plavix 75 qd   . Protein-calorie malnutrition, severe -drinking and smoking -wouldn't supplement unless going to a facility  Appt with PCP: Requested Code Status: Full code Family Communication: no family + currently Disposition Plan: needs placement? DVT prophylaxis: SCD Consultants: Psychiatry  Verneita Griffes, MD  [facility] Pager [phone] 08/08/2014, 2:12 PM    LOS: 5 days

## 2014-08-09 DIAGNOSIS — F205 Residual schizophrenia: Secondary | ICD-10-CM

## 2014-08-09 LAB — COMPREHENSIVE METABOLIC PANEL
ALBUMIN: 3.1 g/dL — AB (ref 3.5–5.0)
ALT: 276 U/L — ABNORMAL HIGH (ref 17–63)
AST: 230 U/L — AB (ref 15–41)
Alkaline Phosphatase: 438 U/L — ABNORMAL HIGH (ref 38–126)
Anion gap: 6 (ref 5–15)
BUN: 6 mg/dL (ref 6–20)
CALCIUM: 8.3 mg/dL — AB (ref 8.9–10.3)
CO2: 27 mmol/L (ref 22–32)
CREATININE: 0.64 mg/dL (ref 0.61–1.24)
Chloride: 111 mmol/L (ref 101–111)
GLUCOSE: 88 mg/dL (ref 65–99)
Potassium: 3.6 mmol/L (ref 3.5–5.1)
Sodium: 144 mmol/L (ref 135–145)
TOTAL PROTEIN: 5.5 g/dL — AB (ref 6.5–8.1)
Total Bilirubin: 0.9 mg/dL (ref 0.3–1.2)

## 2014-08-09 LAB — LIPASE, BLOOD: Lipase: 117 U/L — ABNORMAL HIGH (ref 22–51)

## 2014-08-09 LAB — VALPROIC ACID LEVEL: VALPROIC ACID LVL: 46 ug/mL — AB (ref 50.0–100.0)

## 2014-08-09 MED ORDER — PANCRELIPASE (LIP-PROT-AMYL) 24000-76000 UNITS PO CPEP
1.0000 | ORAL_CAPSULE | Freq: Three times a day (TID) | ORAL | Status: DC
Start: 2014-08-09 — End: 2014-09-06

## 2014-08-09 MED ORDER — TRAZODONE HCL 100 MG PO TABS: 100.0000 mg | ORAL_TABLET | Freq: Every day | ORAL | Status: DC

## 2014-08-09 MED ORDER — HALOPERIDOL DECANOATE 100 MG/ML IM SOLN: 100.0000 mg | INTRAMUSCULAR | Status: AC

## 2014-08-09 MED ORDER — LEVETIRACETAM 1000 MG PO TABS: 1000.0000 mg | ORAL_TABLET | Freq: Two times a day (BID) | ORAL | Status: DC

## 2014-08-09 MED ORDER — BENZTROPINE MESYLATE 0.5 MG PO TABS: 0.5000 mg | ORAL_TABLET | Freq: Two times a day (BID) | ORAL | Status: DC

## 2014-08-09 MED ORDER — QUETIAPINE FUMARATE 50 MG PO TABS: 50.0000 mg | ORAL_TABLET | Freq: Every day | ORAL | Status: AC

## 2014-08-09 MED ORDER — DIVALPROEX SODIUM 500 MG PO DR TAB: 500.0000 mg | DELAYED_RELEASE_TABLET | Freq: Two times a day (BID) | ORAL | Status: DC

## 2014-08-09 NOTE — Discharge Summary (Signed)
Physician Discharge Summary  Chase Blackwell GGY:[id number] DOB: 1965-06-24 DOA: 08/02/2014  PCP: [facility]  Admit date: 08/02/2014 Discharge date: 08/09/2014  Time spent: 35 minutes  Recommendations for Outpatient Follow-up:  1. Need to have depakote levels checked as an OP-please follow  to meds as below Modify on Discharge  - benztropine (COGENTIN) 0.5 MG tablet - divalproex (DEPAKOTE) 500 MG DR table - haloperidol decanoate (HALDOL DECANOATE) 100 MG/ML injection - levETIRAcetam (KEPPRA) 1000 MG tablet - QUEtiapine (SEROQUEL) 50 MG tablet  - traZODone (DESYREL) 100 MG tablet -Creon 1 tab tid  2. Going to [facility]  Discharge Diagnoses:  Principal Problem:   Pancreatitis, acute Active Problems:   Alcohol abuse   Essential hypertension   CAD (coronary artery disease) of artery bypass graft   Schizophrenia   Common bile duct (CBD) obstruction   Protein-calorie malnutrition, severe   Hypotension due to drugs   Bradycardia   Discharge Condition: fair-guarded if continues to drink  Diet recommendation: soft fat controlled  Filed Weights   08/02/14 2046 08/03/14 0233  Weight: 62.959 kg (138 lb 12.8 oz) 61.3 kg (135 lb 2.3 oz)    History of present illness:  49 y/o prior V. 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 Essential hypertension -continue hydralazine 10 bid  . Common bile duct (CBD) obstruction  . Schizophrenia -lack of capacity -needs convalescence -pt/ot to see in consult -continue depakote 500 bid/keppra 1000 bid, quetiapine 50 qhs, trazadone, IM haldol and Cogentin  . CAD (coronary artery disease) of artery bypass graft Continue plavix 75 qd   . Protein-calorie malnutrition, severe -drinking and smoking -wouldn't supplement unless going to a facility   Psychiatry consulted who confirmed patient didn't have medical decision making ability  Discharge Exam: Filed Vitals:   08/09/14 1030  BP: 115/66  Pulse: 57  Temp:   Resp: 16    General: eomi ncat Cardiovascular: s1 s2 no m/r/g Respiratory: clear no added sound  Discharge Instructions   Discharge Instructions    Diet - low sodium heart healthy    Complete by:  As directed      Discharge instructions    Complete by:  As directed      Increase activity slowly    Complete by:  As directed           Current Discharge Medication List    START taking these medications   Details  Pancrelipase, Lip-Prot-Amyl, 24000 UNITS CPEP Take 1 capsule (24,000 Units total) by mouth 3 (three) times daily. Qty: 180 capsule, Refills: 0      CONTINUE these medications which have CHANGED   Details  benztropine (COGENTIN) 0.5 MG tablet Take 1 tablet (0.5 mg total) by mouth 2 (  two) times daily. Qty: 60 tablet, Refills: 0    divalproex (DEPAKOTE) 500 MG DR tablet Take 1 tablet (500 mg total) by mouth 2 (two) times daily. Qty: 60 tablet, Refills: 0    haloperidol decanoate (HALDOL DECANOATE) 100 MG/ML injection Inject 1 mL (100 mg total) into the muscle every 28 (twenty-eight) days. Qty: 1 mL, Refills: 0    levETIRAcetam (KEPPRA) 1000 MG tablet Take 1 tablet (1,000 mg total) by mouth 2 (two) times daily. Qty:  60 tablet, Refills: 0    QUEtiapine (SEROQUEL) 50 MG tablet Take 1 tablet (50 mg total) by mouth at bedtime. Qty: 30 tablet, Refills: 0    traZODone (DESYREL) 100 MG tablet Take 1 tablet (100 mg total) by mouth at bedtime. Qty: 30 tablet, Refills: 0      CONTINUE these medications which have NOT CHANGED   Details  aspirin 81 MG chewable tablet Chew 1 tablet (81 mg total) by mouth daily. Qty: 30 tablet, Refills: 1    atorvastatin (LIPITOR) 80 MG tablet Take 1 tablet (80 mg total) by mouth daily. Qty: 30 tablet, Refills: 0    clopidogrel (PLAVIX) 75 MG tablet Take 75 mg by mouth daily.    feeding supplement, ENSURE ENLIVE, (ENSURE ENLIVE) LIQD Take 237 mLs by mouth 2 (two) times daily between meals. Qty: 237 mL, Refills: 12    folic acid (FOLVITE) 1 MG tablet Take 1 tablet (1 mg total) by mouth daily.    furosemide (LASIX) 40 MG tablet Take 40 mg by mouth daily.    hydrALAZINE (APRESOLINE) 10 MG tablet Take 1 tablet (10 mg total) by mouth 2 (two) times daily. Qty: 60 tablet, Refills: 0    lisinopril (PRINIVIL,ZESTRIL) 2.5 MG tablet Take 2.5 mg by mouth daily.    meloxicam (MOBIC) 15 MG tablet Take 15 mg by mouth daily.    metoprolol tartrate (LOPRESSOR) 25 MG tablet Take 0.5 tablets (12.5 mg total) by mouth 2 (two) times daily. Qty: 30 tablet, Refills: 0    thiamine 100 MG tablet Take 1 tablet (100 mg total) by mouth daily.    vitamin B-12 500 MCG tablet Take 1 tablet (500 mcg total) by mouth daily. Qty: 30 tablet, Refills: 1      STOP taking these medications     LORazepam (ATIVAN) 0.5 MG tablet      Multiple Vitamin (MULTIVITAMIN WITH MINERALS) TABS tablet        Allergies  Allergen Reactions  . Hctz [Hydrochlorothiazide] Other (See Comments)    Dizzy spells  . Hydroxyzine Hives  . Sulfonamide Derivatives Hives  . Cetirizine & Related Other (See Comments)    Brothers were not aware of this allergy      The results of significant diagnostics from this  hospitalization (including imaging, microbiology, ancillary and laboratory) are listed below for reference.    Significant Diagnostic Studies: Dg Chest 2 View  07/18/2014   CLINICAL DATA:  Mid chest pain  EXAM: CHEST - 2 VIEW  COMPARISON:  None.  FINDINGS: The heart size and mediastinal contours are within normal limits. Both lungs are clear. The visualized skeletal structures are unremarkable.  IMPRESSION: No active disease.   Electronically Signed   By: Inez Catalina M.D.   On: 07/18/2014 12:26   US Abdomen Complete  07/18/2014   CLINICAL DATA:  Chronic right upper quadrant abdominal pain.  EXAM: ULTRASOUND ABDOMEN COMPLETE  COMPARISON:  CT scan of January 12, 2014.  FINDINGS: Gallbladder: No wall thickening visualized. No sonographic Percell Miller  sign noted. Non shadowing non mobile echogenic focus measuring 5 mm is noted near gallbladder fundus concerning for adherent stone or polyp. Mild amount of sludge is noted.  Common bile duct: Diameter: 18 mm proximally concerning for distal obstruction. No definite calculus is noted in the visualized portion of the duct.  Liver: Mild intrahepatic ductal dilatation is noted. No other focal abnormality is noted. Echogenicity of parenchyma is within normal limits.  IVC: No abnormality visualized.  Pancreas: Visualized portion appears normal. Pancreatic tail not visualized due to overlying bowel gas.  Spleen: Size and appearance within normal limits.  Right Kidney: Length: 10.4 cm. Echogenicity within normal limits. No mass or hydronephrosis visualized.  Left Kidney: Length: 12.4 cm. Echogenicity within normal limits. No mass or hydronephrosis visualized.  Abdominal aorta: No aneurysm visualized.  Other findings: None.  IMPRESSION: Small adherent gallstone or polyp is noted toward the fundus of the gallbladder. Minimal amount of sludge is noted within the gallbladder lumen. No gallbladder wall thickening, pericholecystic fluid or sonographic Murphy's sign is noted.  Severely  dilated common bile duct is noted as well as mild intrahepatic biliary ductal dilatation. This is concerning for distal common bile duct obstruction and further evaluation with MRCP is recommended.   Electronically Signed   By: Marijo Conception, M.D.   On: 07/18/2014 12:48   Ct Abdomen Pelvis W Contrast  08/03/2014   CLINICAL DATA:  Persistent lower abdominal pain.  EXAM: CT ABDOMEN AND PELVIS WITH CONTRAST  TECHNIQUE: Multidetector CT imaging of the abdomen and pelvis was performed using the standard protocol following bolus administration of intravenous contrast.  CONTRAST:  47mL OMNIPAQUE IOHEXOL 300 MG/ML SOLN, 176mL OMNIPAQUE IOHEXOL 300 MG/ML SOLN  COMPARISON:  MRCP 08/10/2024, CT 01/12/2014  FINDINGS: The included lung bases are clear.  There is intra and extrahepatic biliary ductal dilatation. Common bile duct measures 2.2 cm. The gallbladder is distended. Findings are similar to prior exam. No focal hepatic lesion.  There is peripancreatic soft tissue stranding about the tail. Minimal soft tissue stranding about pancreatic head. Dilated pancreatic duct, similar to prior MRCP. Focal 1 cm round hypodensity in the region of the pancreatic head has appear in ductal communication and likely represent sequela of pancreatitis. There is no peripancreatic pseudocyst. Splenic vein remains patent.  Symmetric renal enhancement. No hydronephrosis. Tiny cortical cyst in the upper right kidney. Mild thickening of the left adrenal gland, right adrenal gland is normal.  Stomach is physiologically distended. There are no dilated or thickened bowel loops. Moderate stool throughout the colon without colonic wall thickening. The appendix is normal. No free air or intra-abdominal fluid collection.  No retroperitoneal adenopathy. Abdominal aorta is normal in caliber. Mild atherosclerosis of the abdominal aorta.  Within the pelvis the bladder is distended. There is fat within both inguinal canals.  There are no acute or  suspicious osseous abnormalities.  IMPRESSION: 1. Findings consistent with persistent acute pancreatitis. No drainable fluid collection or pseudocyst. 2. Persistent biliary dilatation, similar to prior MRCP. 3. No new abnormalities are seen.   Electronically Signed   By: Jeb Levering M.D.   On: 08/03/2014 02:18   Mr 3d Recon At Scanner  07/24/2014   CLINICAL DATA:  Abdominal pain, acute pancreatitis with transaminitis, dilated CBD on ultrasound.  EXAM: MRI ABDOMEN WITHOUT AND WITH CONTRAST (INCLUDING MRCP)  TECHNIQUE: Multiplanar multisequence MR imaging of the abdomen was performed both before and after the administration of intravenous contrast. Heavily T2-weighted images of the biliary and pancreatic ducts were  obtained, and three-dimensional MRCP images were rendered by post processing.  CONTRAST:  57mL MULTIHANCE GADOBENATE DIMEGLUMINE 529 MG/ML IV SOLN  COMPARISON:  Abdominal ultrasound dated 07/18/2014. CT abdomen pelvis dated 01/12/2014.  FINDINGS: Motion degraded images.  Lower chest:  Lung bases are clear.  Hepatobiliary: Liver is within normal limits. No suspicious/enhancing hepatic lesions. No hepatic steatosis.  Mildly distended gallbladder with layering tiny gallstones/sludge (series 11/ image 33). No associated inflammatory changes.  Mild central intrahepatic ductal prominence. Common duct is dilated, measuring 20 mm, and abruptly tapers at the ampulla. No choledocholithiasis is seen.  Pancreas: Mild peripancreatic inflammatory changes with fluid/stranding along the distal pancreatic body/tail. No drainable fluid collection/pseudocyst.  Irregular dilatation of the pancreatic duct which abruptly tapers in the region of the pancreatic head (double duct sign). No definite pancreatic head mass is visualized, noting motion degradation.  Spleen: Within normal limits.  Adrenals/Urinary Tract: Adrenal glands are within normal limits.  Kidneys are within normal limits.  No hydronephrosis.   Stomach/Bowel: Stomach and visualized bowel are grossly unremarkable.  Vascular/Lymphatic: No evidence of abdominal aortic aneurysm.  No suspicious abdominal lymphadenopathy.  Other: Small volume fluid in the left mid abdomen.  Musculoskeletal: No focal osseous lesions.  IMPRESSION: Motion degraded images.  Acute pancreatitis with fluid/stranding along the distal pancreatic body/tail. No drainable fluid collection/pseudocyst.  Dilated common duct, measuring 20 mm, abruptly tapering at the ampulla. No choledocholithiasis is seen.  Associated irregular dilatation of the pancreatic duct which abruptly tapers in the region of the pancreatic head (double duct sign). While no definite pancreatic head mass is visualized on the current study, follow-up EUS or repeat MRI abdomen with/without contrast in 3-4 weeks to exclude an occult pancreatic head neoplasm.  Cholelithiasis, without associated inflammatory changes to suggest acute cholecystitis.   Electronically Signed   By: Julian Hy M.D.   On: 07/24/2014 16:17   Dg Chest Port 1 View  07/31/2014   CLINICAL DATA:  Abdominal pain and hypotension  EXAM: PORTABLE CHEST - 1 VIEW  COMPARISON:  None.  FINDINGS: The heart size and mediastinal contours are within normal limits. Both lungs are clear. The visualized skeletal structures are unremarkable.  IMPRESSION: No active disease.   Electronically Signed   By: Andreas Newport M.D.   On: 07/31/2014 02:18   Dg Abd Acute W/chest  08/02/2014   CLINICAL DATA:  Lower quadrant abdominal pain bilaterally.  EXAM: DG ABDOMEN ACUTE W/ 1V CHEST  COMPARISON:  07/31/2014  FINDINGS: There is air throughout slightly prominent small bowel, nonobstructive. Stool and air is present throughout the colon. There is no free intraperitoneal air. The upright view of the chest is negative for acute abnormality.  IMPRESSION: Negative abdominal radiographs.  No acute cardiopulmonary disease.   Electronically Signed   By: Andreas Newport  M.D.   On: 08/02/2014 22:33   Dg Abd Portable 1v  07/31/2014   CLINICAL DATA:  Abdominal pain.  Hypotension.  EXAM: PORTABLE ABDOMEN - 1 VIEW  COMPARISON:  None.  FINDINGS: There is a generous volume of stool and air throughout the colon. There is no radiographic evidence of bowel obstruction or perforation. No biliary or urinary calculi are evident.  IMPRESSION: Generous volume colonic stool and gas without evidence of obstruction or perforation.   Electronically Signed   By: Andreas Newport M.D.   On: 07/31/2014 02:17   Mr Jeananne Rama W/wo Cm/mrcp  07/24/2014   CLINICAL DATA:  Abdominal pain, acute pancreatitis with transaminitis, dilated CBD on ultrasound.  EXAM: MRI  ABDOMEN WITHOUT AND WITH CONTRAST (INCLUDING MRCP)  TECHNIQUE: Multiplanar multisequence MR imaging of the abdomen was performed both before and after the administration of intravenous contrast. Heavily T2-weighted images of the biliary and pancreatic ducts were obtained, and three-dimensional MRCP images were rendered by post processing.  CONTRAST:  41mL MULTIHANCE GADOBENATE DIMEGLUMINE 529 MG/ML IV SOLN  COMPARISON:  Abdominal ultrasound dated 07/18/2014. CT abdomen pelvis dated 01/12/2014.  FINDINGS: Motion degraded images.  Lower chest:  Lung bases are clear.  Hepatobiliary: Liver is within normal limits. No suspicious/enhancing hepatic lesions. No hepatic steatosis.  Mildly distended gallbladder with layering tiny gallstones/sludge (series 11/ image 33). No associated inflammatory changes.  Mild central intrahepatic ductal prominence. Common duct is dilated, measuring 20 mm, and abruptly tapers at the ampulla. No choledocholithiasis is seen.  Pancreas: Mild peripancreatic inflammatory changes with fluid/stranding along the distal pancreatic body/tail. No drainable fluid collection/pseudocyst.  Irregular dilatation of the pancreatic duct which abruptly tapers in the region of the pancreatic head (double duct sign). No definite pancreatic head  mass is visualized, noting motion degradation.  Spleen: Within normal limits.  Adrenals/Urinary Tract: Adrenal glands are within normal limits.  Kidneys are within normal limits.  No hydronephrosis.  Stomach/Bowel: Stomach and visualized bowel are grossly unremarkable.  Vascular/Lymphatic: No evidence of abdominal aortic aneurysm.  No suspicious abdominal lymphadenopathy.  Other: Small volume fluid in the left mid abdomen.  Musculoskeletal: No focal osseous lesions.  IMPRESSION: Motion degraded images.  Acute pancreatitis with fluid/stranding along the distal pancreatic body/tail. No drainable fluid collection/pseudocyst.  Dilated common duct, measuring 20 mm, abruptly tapering at the ampulla. No choledocholithiasis is seen.  Associated irregular dilatation of the pancreatic duct which abruptly tapers in the region of the pancreatic head (double duct sign). While no definite pancreatic head mass is visualized on the current study, follow-up EUS or repeat MRI abdomen with/without contrast in 3-4 weeks to exclude an occult pancreatic head neoplasm.  Cholelithiasis, without associated inflammatory changes to suggest acute cholecystitis.   Electronically Signed   By: Julian Hy M.D.   On: 07/24/2014 16:17    Microbiology: Recent Results (from the past 240 hour(s))  MRSA PCR Screening     Status: None   Collection Time: 08/03/14  3:53 AM  Result Value Ref Range Status   MRSA by PCR NEGATIVE NEGATIVE Final    Comment:        The GeneXpert MRSA Assay (FDA approved for NASAL specimens only), is one component of a comprehensive MRSA colonization surveillance program. It is not intended to diagnose MRSA infection nor to guide or monitor treatment for MRSA infections.      Labs: Basic Metabolic Panel:  Recent Labs Lab 08/03/14 0530  08/05/14 0410 08/06/14 0345 08/07/14 0434 08/08/14 0332 08/09/14 0515  NA 141  < > 143 142 143 142 144  K 3.9  < > 3.5 3.4* 3.9 3.5 3.6  CL 110  < > 111  110 110 109 111  CO2 26  < > 26 23 26 27 27   GLUCOSE 78  < > 76 59* 70 85 88  BUN 6  < > 6 8 6 8 6   CREATININE 0.57*  < > 0.55* 0.62 0.61 0.54* 0.64  CALCIUM 8.2*  < > 8.5* 8.3* 8.5* 8.3* 8.3*  MG 1.9  --   --   --   --   --   --   PHOS 3.8  --   --   --   --   --   --   < > =  values in this interval not displayed. Liver Function Tests:  Recent Labs Lab 08/05/14 0410 08/06/14 0345 08/07/14 0434 08/08/14 0332 08/09/14 0515  AST 104* 50* 63* 137* 230*  ALT 171* 119* 113* 150* 276*  ALKPHOS 358* 337* 381* 408* 438*  BILITOT 0.6 1.1 0.6 0.7 0.9  PROT 5.8* 5.9* 5.7* 5.5* 5.5*  ALBUMIN 3.1* 3.2* 3.0* 2.9* 3.1*    Recent Labs Lab 08/05/14 0410 08/06/14 0345 08/07/14 0434 08/08/14 0332 08/09/14 0515  LIPASE 117* 90* 47 53* 117*   No results for input(s): AMMONIA in the last 168 hours. CBC:  Recent Labs Lab 08/02/14 2202 08/03/14 0530  WBC 5.1 4.5  NEUTROABS 2.6  --   HGB 12.3* 11.0*  HCT 36.8* 33.6*  MCV 95.8 96.6  PLT 293 248   Cardiac Enzymes: No results for input(s): CKTOTAL, CKMB, CKMBINDEX, TROPONINI in the last 168 hours. BNP: BNP (last 3 results)  Recent Labs  05/01/14 2153 06/06/14 2206  BNP 21.0 28.5    ProBNP (last 3 results) No results for input(s): PROBNP in the last 8760 hours.  CBG: No results for input(s): GLUCAP in the last 168 hours.     SignedNita Sells  [facility] 08/09/2014, 11:17 AM

## 2014-08-09 NOTE — Progress Notes (Signed)
Patient was picked by his brother, Jayro Mcmath and left the unit via w/c at [facility].

## 2014-08-09 NOTE — Care Management Note (Signed)
Case Management Note  Patient Details  Name: Chase Blackwell MRN: [id number] Date of Birth: 1965-03-03  Subjective/Objective:                    Action/Plan:d/c SNF.   Expected Discharge Date:                  Expected Discharge Plan:  [place]  In-House Referral:     Discharge planning Services  CM Consult  Post Acute Care Choice:    Choice offered to:     DME Arranged:    DME Agency:     HH Arranged:    [place] Agency:     Status of Service:  Completed, signed off  Medicare Important Message Given:  Yes-third notification given Date Medicare IM Given:    Medicare IM give by:    Date Additional Medicare IM Given:    Additional Medicare Important Message give by:     If discussed at [place] of Stay Meetings, dates discussed:    Additional Comments:  Dessa Phi, RN 08/09/2014, 11:25 AM

## 2014-08-09 NOTE — Clinical Social Work Placement (Signed)
Patient is set to discharge to [facility] today. Patient & brother, Donnie aware. Discharge packet given to RN, [facility]. Patient's brother, Donnie to transport to SNF around 2:00pm.     Raynaldo Opitz, [place] Social Worker cell #: [phone]    CLINICAL SOCIAL WORK PLACEMENT  NOTE  Date:  08/09/2014  Patient Details  Name: Chase Blackwell MRN: [id number] Date of Birth: March 01, 1965  Clinical Social Work is seeking post-discharge placement for this patient at the [place] level of care (*CSW will initial, date and re-position this form in  chart as items are completed):  Yes   Patient/family provided with [place] Work Department's list of facilities offering this level of care within the geographic area requested by the patient (or if unable, by the patient's family).  Yes   Patient/family informed of their freedom to choose among providers that offer the needed level of care, that participate in Medicare, Medicaid or managed care program needed by the patient, have an available bed and are willing to accept the patient.  Yes   Patient/family informed of [facility]'s ownership interest in [facility] and [facility], as well as of the fact that they are under no obligation to receive care at these facilities.  PASRR submitted to EDS on 08/08/14     PASRR number received on       Existing PASRR number confirmed on       FL2 transmitted to all facilities in geographic area requested by pt/family on 08/08/14     FL2 transmitted to all facilities within larger geographic area on 08/08/14     Patient informed that his/her managed care company has contracts with or will negotiate with certain facilities, including the following:        Yes   Patient/family informed of bed offers received.  Patient chooses bed at [facility]     Physician recommends and patient chooses bed at       Patient to be transferred to [facility] at [place] on 08/09/14.  Patient to be transferred to facility by patient's brother, [name]     Patient family notified on 08/09/14 of transfer.  Name of family member notified:  patient's brother, Donnie via phone     PHYSICIAN       Additional Comment:    _______________________________________________ Standley Brooking, LCSW 08/09/2014, 11:00 AM

## 2014-08-09 NOTE — Progress Notes (Signed)
Clinical Social Work  Unit CSW reports that patient to DC to SNF today. MD signed Notice of Commitment Change to rescind IVC. CSW is signing off but available if needed.  [place], [place] [phone]

## 2014-08-09 NOTE — Progress Notes (Signed)
08/09/14  1250  Called report to [facility] [phone] spoke with [facility] and [facility].

## 2014-08-26 ENCOUNTER — Encounter: Payer: Self-pay | Admitting: Internal Medicine

## 2014-08-31 ENCOUNTER — Ambulatory Visit (HOSPITAL_COMMUNITY)
Admission: RE | Admit: 2014-08-31 | Discharge: 2014-08-31 | Disposition: A | Discharge status: Home / Self Care | Payer: Medicare Other | Source: Ambulatory Visit | Attending: Internal Medicine | Admitting: Internal Medicine

## 2014-08-31 ENCOUNTER — Other Ambulatory Visit (HOSPITAL_COMMUNITY): Payer: Self-pay | Admitting: Internal Medicine

## 2014-08-31 DIAGNOSIS — I2510 Atherosclerotic heart disease of native coronary artery without angina pectoris: Secondary | ICD-10-CM | POA: Insufficient documentation

## 2014-08-31 DIAGNOSIS — R1010 Upper abdominal pain, unspecified: Secondary | ICD-10-CM | POA: Insufficient documentation

## 2014-08-31 DIAGNOSIS — R822 Biliuria: Secondary | ICD-10-CM | POA: Insufficient documentation

## 2014-08-31 DIAGNOSIS — K859 Acute pancreatitis, unspecified: Secondary | ICD-10-CM | POA: Insufficient documentation

## 2014-08-31 DIAGNOSIS — K838 Other specified diseases of biliary tract: Secondary | ICD-10-CM | POA: Insufficient documentation

## 2014-08-31 MED ORDER — IOHEXOL 300 MG/ML  SOLN
100.0000 mL | Freq: Once | INTRAMUSCULAR | Status: AC | PRN
  Administered 2014-08-31: 100 mL via INTRAVENOUS

## 2014-09-06 ENCOUNTER — Ambulatory Visit (INDEPENDENT_AMBULATORY_CARE_PROVIDER_SITE_OTHER): Payer: Medicare Other | Admitting: Gastroenterology

## 2014-09-06 ENCOUNTER — Encounter: Payer: Self-pay | Admitting: Gastroenterology

## 2014-09-06 ENCOUNTER — Other Ambulatory Visit: Payer: Self-pay

## 2014-09-06 VITALS — BP 101/71 | HR 79 | Temp 97.5°F | Ht 72.0 in | Wt 142.0 lb

## 2014-09-06 DIAGNOSIS — F1010 Alcohol abuse, uncomplicated: Secondary | ICD-10-CM

## 2014-09-06 DIAGNOSIS — R1013 Epigastric pain: Secondary | ICD-10-CM

## 2014-09-06 DIAGNOSIS — K838 Other specified diseases of biliary tract: Secondary | ICD-10-CM

## 2014-09-06 DIAGNOSIS — K831 Obstruction of bile duct: Secondary | ICD-10-CM

## 2014-09-06 DIAGNOSIS — R7989 Other specified abnormal findings of blood chemistry: Secondary | ICD-10-CM

## 2014-09-06 DIAGNOSIS — R932 Abnormal findings on diagnostic imaging of liver and biliary tract: Secondary | ICD-10-CM

## 2014-09-06 DIAGNOSIS — R945 Abnormal results of liver function studies: Secondary | ICD-10-CM

## 2014-09-06 LAB — COMPREHENSIVE METABOLIC PANEL
ALK PHOS: 870 U/L — AB (ref 40–115)
ALT: 84 U/L — AB (ref 9–46)
AST: 69 U/L — AB (ref 10–40)
Albumin: 3.9 g/dL (ref 3.6–5.1)
BILIRUBIN TOTAL: 7.9 mg/dL — AB (ref 0.2–1.2)
BUN: 16 mg/dL (ref 7–25)
CO2: 29 mmol/L (ref 20–31)
CREATININE: 0.93 mg/dL (ref 0.60–1.35)
Calcium: 9.2 mg/dL (ref 8.6–10.3)
Chloride: 93 mmol/L — ABNORMAL LOW (ref 98–110)
GLUCOSE: 90 mg/dL (ref 65–99)
Potassium: 4.9 mmol/L (ref 3.5–5.3)
SODIUM: 132 mmol/L — AB (ref 135–146)
TOTAL PROTEIN: 6.9 g/dL (ref 6.1–8.1)

## 2014-09-06 LAB — CBC WITH DIFFERENTIAL/PLATELET
BASOS ABS: 0.1 10*3/uL (ref 0.0–0.1)
Basophils Relative: 1 % (ref 0–1)
EOS ABS: 0.9 10*3/uL — AB (ref 0.0–0.7)
EOS PCT: 9 % — AB (ref 0–5)
HCT: 32.4 % — ABNORMAL LOW (ref 39.0–52.0)
Hemoglobin: 11.5 g/dL — ABNORMAL LOW (ref 13.0–17.0)
LYMPHS ABS: 1.9 10*3/uL (ref 0.7–4.0)
Lymphocytes Relative: 19 % (ref 12–46)
MCH: 31.3 pg (ref 26.0–34.0)
MCHC: 35.5 g/dL (ref 30.0–36.0)
MCV: 88.3 fL (ref 78.0–100.0)
MPV: 9.9 fL (ref 8.6–12.4)
Monocytes Absolute: 0.8 10*3/uL (ref 0.1–1.0)
Monocytes Relative: 8 % (ref 3–12)
NEUTROS PCT: 63 % (ref 43–77)
Neutro Abs: 6.2 10*3/uL (ref 1.7–7.7)
PLATELETS: 450 10*3/uL — AB (ref 150–400)
RBC: 3.67 MIL/uL — AB (ref 4.22–5.81)
RDW: 14.9 % (ref 11.5–15.5)
WBC: 9.9 10*3/uL (ref 4.0–10.5)

## 2014-09-06 LAB — PROTIME-INR
INR: 0.95 (ref <1.50)
Prothrombin Time: 12.8 seconds (ref 11.6–15.2)

## 2014-09-06 LAB — LIPASE: LIPASE: 18 U/L (ref 7–60)

## 2014-09-06 MED ORDER — PANCRELIPASE (LIP-PROT-AMYL) 24000-76000 UNITS PO CPEP: ORAL_CAPSULE | ORAL | Status: AC

## 2014-09-06 NOTE — Assessment & Plan Note (Signed)
49 year old gentleman who presents today as a new patient consultation for common bile duct obstruction and pancreatitis. He has been extensively evaluated as detailed above but unfortunately due to noncompliance his evaluation has never been completed. He has since been deemed inappropriate to make his own medical decisions and was placed at a skilled nursing facility. Since January's initial CT scan suggestive of acute pancreatitis he has developed progressive intrahepatic/extrahepatic biliary dilation as well as pancreatic duct abnormality (double duct sign). No definite pancreatic head mass seen on MRI/MRCP in July however there was some motion degradation noted.  Patient presents today with clinical jaundice. Bilirubin was normal on 08/14/2014. Would be very concerned about malignancy given this clinical scenario.   Discussed at length with Dr. Oneida Alar. Plan on stat labs today and hopefully expedite EUS/ERCP with Dr. Ardis Hughs next week. Dr. Oneida Alar has touched base with Dr. Fuller Plan who is familiar with patient. Per joint recommendations, no plans on empiric antibiotic therapy however if patient develops fever or any other signs of cholangitis, should initiate antibiotic therapy. If worsening abdominal pain, inability to maintain oral intake, then patient would require admission.

## 2014-09-06 NOTE — Patient Instructions (Signed)
1. STAT labs today. 2. Increase pancreatic enzymes. See new RX. 48,000 units with meals (TID) and 24,000 units with snacks (BID).  3. We will make arrangements for you to have EUS/ERCP as soon as possible.  4. If you develop fever/chills, worsening pain, cannot maintain hydration, go to the ER.

## 2014-09-06 NOTE — Progress Notes (Signed)
SPOKE TO April AT [facility]. AWARE PT HAS T BILI 7. INSTRUCTED HER THAT PT SHOULD GO TO [facility] IF HE DEVELOPS FEVER, AND/OR WORSENING ABDOMINAL PAIN, NAUSEA, VOMITING. IF PT APPEARS UNSTABLE A VISIT TO [facility] ED WITH TRANSFER WOULD BE APPROPRIATE. SHE VOICED HER UNDERSTANDING. SHE WILL CALL TUES AM IF SHE HASN'T HEARD FROM  GI(DRs. JACOBS/STARK) BY TUES SEP 6.

## 2014-09-06 NOTE — Progress Notes (Signed)
REVIEWED. PT NEEDS STAT CMP/PT/INR, CBC TODAY. REVIEWED CASE WITH DR. Fuller Plan. HE WILL ARRANGE FOR PT TO BE SEEN IN [place] FOR EUS/ERCP NEXT WEEK. HE HAS DISCUSSED CASE WITH DR. Ardis Hughs BEFORE. AGREES NO ABX UNLESS PT HAS S/Sx OF CHOLANGITIS. H IS AWARE PT DOES NOT HAVE THE CAPACITY TO REFUSE APPROPRIATE MEDICAL CARE. HE HAS PH# FOR [facility] TO CONTACT FACILITY TO ARRANGE APPT.

## 2014-09-06 NOTE — Progress Notes (Signed)
Primary Care Physician:  [name], MD  Primary Gastroenterologist:  Garfield Cornea, MD   Chief Complaint  Patient presents with  . Abdominal Pain  . Pancreatitis    HPI:  Chase Blackwell is a 49 y.o. male here at the request of Dr. Maudie Mercury from [facility] for further evaluation of biliary obstruction/pancreatitis. Patient has an extensive history as detailed below. Essentially has a history of alcohol abuse, schizophrenia, seizures status post anoxic brain injury following cardiac/respiratory arrest last year at time of acute MI. Multiple hospitalizations this year for abdominal pain felt to be related to pancreatitis. Presentation started around January 2016. Initial CT scan suggestive of acute pancreatitis, LFTs normal at that time. Over the course of the last 9 months, patient has been hospitalized 7 total times, leaving AMA at least 4-5 times. Extensively evaluated and noted to have progressive intra-next or hepatic biliary dilation and progressive LFTs, now presenting with clinical jaundice. His bilirubin was normal on 08/14/2014.  Patient complains of constant upper abdominal pain. States it's 10 out of 10 currently but he does not look uncomfortable. States he is able to eat anyways. He is a very poor historian. States he has vomited at least a couple of times per week. Bowel movements are infrequent, stating he has been in a couple of weeks. Occasional bright red blood per rectum. Denies melena. Long history of alcohol abuse. States he was drinking at least 80 ounces of beer every other day prior to moving into the nursing home. Denies heartburn or dysphagia. He notes a significant weight loss but really is unable to quantify how much. States he used to weigh over 200 pounds years ago. Looking through the records his weight does tend to fluctuate but he is down at least 10-20 pounds in the last several months. States he has significant memory loss.  Recent CT abdomen pelvis  on 08/31/2014 showing worsening biliary ductal dilation, both intrahepatic and extrahepatic. Gallbladder distended similar to prior study. Pancreatic duct dilated. Area of low density within the pancreatic head. This could reflect edema from pancreatitis but difficult to exclude pancreatic head lesion. Large stool burden.  Recent outpatient labs done at the nursing home on 08/14/2014 Total bilirubin 0.8, alkaline phosphatase 422, AST 109, ALT 217, creatinine 0.58, white blood cell count 5900, hemoglobin 11.3, hematocrit 36, platelets 202,000, lipase 26.   Admission 08/02/2014 through 08/09/2014. CT abdomen pelvis with contrast showing intra-and extrahepatic biliary ductal dilation, and CBD 2.2 cm. Gallbladder distended. Pancreatic soft tissue stranding around the tail of the pancreas. Minimal soft tissue stranding around the pancreatic head. Focal 1 cm round hypodensity in the region of the pancreatic head, questionable ductal communication from previous pancreatitis. Dilated pancreatic duct stable. -Lipase up to 305 at the highest, 117 at discharge. -Total bilirubin normal, alkaline phosphatase as high as 438, AST as high as 230, ALT as high as 276. -Ethanol 45 on admission  Admission 07/30/2014 through 08/01/2014, patient left AMA -Alkaline phosphatase in the 200 range, total bilirubin and transaminases normal -Lipase as high as 644  Admission 07/22/2014 through 07/25/2014, patient left AMA MRI/MRCP on 07/24/2014 mildly distended gallbladder with layering tiny gallstones/sludge. Mild central intrahepatic ductal prominence. Common duct dilated measuring 20 mm in abruptly tapering at the ampulla. No choledocholithiasis. Mild inflammatory changes along the distal pancreatic body/tail. Irregular dilation of the pancreatic duct which abruptly tapers in the region of the pancreatic head (double duct sign). No definite pancreatic head mass visualized however motion degradation was noted.  -  Total  bilirubin normal, alkaline phosphatase up to 332, AST up to 47, ALT of 36, lipase up to 337.  Admission 07/18/2014 through 07/19/2014, patient left AMA Abdominal ultrasound showed non-shadowing nonmobile echogenic focus measuring 5 mm near the gallbladder fundus, minimal amount of sludge. Common bile duct 18 mm proximally concerning for distal obstruction. No definite calculus seen. Mild intrahepatic ductal dilation.  -Total bilirubin normal, alkaline phosphatase 453, AST 241, ALT 326, lipase 387.  Admission 06/30/2014 through 07/03/2014 -LFTs normal -Lipase 716 -No imaging  Admission 01/21/2014 through 01/24/2014, patient left AMA  -Lipase 515 -LFTs normal  Admission 01/16/2014 through 01/18/2014, patient left AMA  -Lipase 210 -LFTs normal  CT abdomen pelvis with contrast on 01/12/2014 Acute pancreatitis.   Current Outpatient Prescriptions  Medication Sig Dispense Refill  . aspirin 81 MG chewable tablet Chew 1 tablet (81 mg total) by mouth daily. 30 tablet 1  . atorvastatin (LIPITOR) 80 MG tablet Take 1 tablet (80 mg total) by mouth daily. (Patient taking differently: Take 80 mg by mouth at bedtime. ) 30 tablet 0  . benztropine (COGENTIN) 0.5 MG tablet Take 1 tablet (0.5 mg total) by mouth 2 (two) times daily. 60 tablet 0  . clopidogrel (PLAVIX) 75 MG tablet Take 75 mg by mouth daily.    . divalproex (DEPAKOTE) 500 MG DR tablet Take 1 tablet (500 mg total) by mouth 2 (two) times daily. 60 tablet 0  . feeding supplement, ENSURE ENLIVE, (ENSURE ENLIVE) LIQD Take 237 mLs by mouth 2 (two) times daily between meals. 824 mL 12  . folic acid (FOLVITE) 1 MG tablet Take 1 tablet (1 mg total) by mouth daily.    . furosemide (LASIX) 40 MG tablet Take 40 mg by mouth daily.    . haloperidol decanoate (HALDOL DECANOATE) 100 MG/ML injection Inject 1 mL (100 mg total) into the muscle every 28 (twenty-eight) days. 1 mL 0  . hydrALAZINE (APRESOLINE) 10 MG tablet Take 1 tablet (10 mg total) by mouth  2 (two) times daily. 60 tablet 0  . levETIRAcetam (KEPPRA) 1000 MG tablet Take 1 tablet (1,000 mg total) by mouth 2 (two) times daily. 60 tablet 0  . lisinopril (PRINIVIL,ZESTRIL) 2.5 MG tablet Take 2.5 mg by mouth daily.    Marland Kitchen LORazepam (ATIVAN) 0.5 MG tablet TAKE 1 TABLET BY MOUTH EVERY EVENING AS NEEDED  2  . meloxicam (MOBIC) 15 MG tablet Take 15 mg by mouth daily.    . metoprolol tartrate (LOPRESSOR) 25 MG tablet Take 0.5 tablets (12.5 mg total) by mouth 2 (two) times daily. (Patient taking differently: Take 25 mg by mouth daily. ) 30 tablet 0  . oxyCODONE (OXY IR/ROXICODONE) 5 MG immediate release tablet one every four hours prn  0  . Pancrelipase, Lip-Prot-Amyl, 24000 UNITS CPEP Take 1 capsule (24,000 Units total) by mouth 3 (three) times daily. 180 capsule 0  . polyethylene glycol (MIRALAX / GLYCOLAX) packet Take 17 g by mouth daily.    . QUEtiapine (SEROQUEL) 50 MG tablet Take 1 tablet (50 mg total) by mouth at bedtime. 30 tablet 0  . thiamine 100 MG tablet Take 1 tablet (100 mg total) by mouth daily.    . traZODone (DESYREL) 100 MG tablet Take 1 tablet (100 mg total) by mouth at bedtime. 30 tablet 0  . vitamin B-12 500 MCG tablet Take 1 tablet (500 mcg total) by mouth daily. 30 tablet 1  . [DISCONTINUED] modafinil (PROVIGIL) 100 MG tablet Take one tablet by mouth every morning 30 tablet 5  . [  DISCONTINUED] promethazine (PHENERGAN) 25 MG tablet Take 1 tablet (25 mg total) by mouth every 6 (six) hours as needed for nausea. 15 tablet 0  . [DISCONTINUED] zolpidem (AMBIEN) 5 MG tablet Take 1 tablet (5 mg total) by mouth at bedtime as needed for sleep. 30 tablet 5   No current facility-administered medications for this visit.    Allergies as of 09/06/2014 - Review Complete 09/06/2014  Allergen Reaction Noted  . Hctz [hydrochlorothiazide] Other (See Comments) 02/01/2013  . Hydroxyzine Hives 05/09/2011  . Sulfonamide derivatives Hives   . Cetirizine & related Other (See Comments) 05/09/2011     Past Medical History  Diagnosis Date  . Hypertension   . Asthma   . GERD (gastroesophageal reflux disease)   . Chronic pain   . Schizophrenia   . Alcohol abuse   . Seizures 07/2013    seizure like activity following cardiac/resp arrest.   . 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 Depression   . CAD (coronary artery disease)   . Cardiac arrest 07/2013    with acute MI and secondary respiratory arrest  . Protein calorie malnutrition 07/2013    Past Surgical History  Procedure Laterality Date  . Left heart catheterization with coronary angiogram N/A 07/06/2013    Procedure: LEFT HEART CATHETERIZATION WITH CORONARY ANGIOGRAM;  Surgeon: Clent Demark, MD;  Location: [facility] CATH LAB;  Service: Cardiovascular;  Laterality: N/A;  . Percutaneous coronary stent intervention (pci-s)  07/06/2013    Procedure: PERCUTANEOUS CORONARY STENT INTERVENTION (PCI-S);  Surgeon: Clent Demark, MD;  Location: [facility] CATH LAB;  Service: Cardiovascular;;  . Tracheostomy  07/2013    Family History  Problem Relation Age of Onset  . Malignant hyperthermia Mother   . Cirrhosis Father   . Alcohol abuse Father     Social History   Social History  . Marital Status: Single    Spouse Name: N/A  . Number of Children: 0  . Years of Education: N/A   Occupational History  . Not on file.   Social History Main Topics  . Smoking status: Current Every Day Smoker -- 2.00 packs/day    Types: Cigarettes  . Smokeless tobacco: Not on file  . Alcohol Use: 0.0 oz/week    0 Standard drinks or equivalent per week     Comment: daily heavy drinker, 80 ounces every 1-2 days. for years  . Drug Use: Yes    Special: Marijuana  . Sexual Activity: Not on file   Other Topics Concern  . Not on file   Social History Narrative      ROS:  General: Negative for anorexia,   fever, chills, fatigue, weakness. Positive weight loss Eyes: Negative for vision changes.  ENT: Negative for hoarseness, difficulty swallowing  , nasal congestion. CV: Negative for chest pain, angina, palpitations, dyspnea on exertion, peripheral edema.  Respiratory: Negative for dyspnea at rest, dyspnea on exertion, cough, sputum, wheezing.  GI: See history of present illness. GU:  Negative for dysuria, hematuria, urinary incontinence, urinary frequency, nocturnal urination.  MS: Negative for joint pain, low back pain.  Derm: Negative for rash or itching.  Neuro: Negative for weakness, abnormal sensation, frequent headaches. Positive memory loss and seizures.  Psych: Negative for anxiety, depression, suicidal ideation, hallucinations.  Endo: See history of present illness  Heme: Negative for bruising or bleeding. Allergy: Negative for rash or hives.    Physical Examination:  BP 101/71 mmHg  Pulse 79  Temp(Src) 97.5 F (36.4 C)  Ht  6' (1.829 m)  Wt 142 lb (64.411 kg)  BMI 19.25 kg/m2   General: Chronically ill-appearing, appears older than stated age.  Head: Normocephalic, atraumatic.   Eyes: Conjunctiva pink, positive scleral icterus  Mouth: Oropharyngeal mucosa moist and pink , no lesions erythema or exudate. Poor dentition tension Neck: Supple without thyromegaly, masses, or lymphadenopathy.  Lungs: Clear to auscultation bilaterally.  Heart: Regular rate and rhythm, no murmurs rubs or gallops.  Abdomen: Bowel sounds are normal, moderate epigastric tenderness, nondistended, no hepatosplenomegaly or masses, no abdominal bruits or    hernia , no rebound or guarding.   Rectal: Not performed Extremities: No lower extremity edema. No clubbing or deformities.  Neuro: Alert and oriented x 4 , grossly normal neurologically.  Skin: Warm and dry, no rash. Positive jaundice.   Psych: Alert and cooperative, normal mood and affect.  Labs: See above  Imaging Studies: Ct Abdomen Pelvis W Contrast  08/31/2014   CLINICAL DATA:  Upper abdominal pain. Increase bilirubin. Recent hospitalization for pancreatitis.  EXAM: CT ABDOMEN  AND PELVIS WITH CONTRAST  TECHNIQUE: Multidetector CT imaging of the abdomen and pelvis was performed using the standard protocol following bolus administration of intravenous contrast.  CONTRAST:  115mL OMNIPAQUE IOHEXOL 300 MG/ML  SOLN  COMPARISON:  08/03/2014.  MRI 07/24/2014  FINDINGS: Lung bases are clear. No effusions. Heart is normal size. Coronary artery calcifications noted in the visualized right coronary artery.  There is worsening biliary ductal dilatation, both intrahepatic and extrahepatic. Gallbladder is distended, similar to prior study. Pancreatic duct is dilated. Area of low density within the pancreatic head. This could reflect edema related pancreatitis, but it is difficult to completely exclude a pancreatic head lesion given the degree of biliary and pancreatic ductal dilatation.  Large stool burden throughout the colon. Views gaseous distention of bowel may reflect mild ileus. Aorta and iliac vessels are calcified, non aneurysmal.  Spleen, adrenals and kidneys are unremarkable. No free fluid, free air or adenopathy.  No acute bony abnormality or focal bone lesion.  IMPRESSION: Increasing biliary ductal dilatation within the liver and in the common bile duct. Continued pancreatic ductal dilatation. There is an area of ill-defined low-density in the pancreatic head which could reflect edema related to acute pancreatitis, but I cannot exclude pancreatic head lesion. After acute symptoms resolve, MRI of the abdomen would be beneficial to further evaluate. Followup MRI was also recommended on prior MRI of the abdomen.  Large stool burden throughout the colon. Mild diffuse gaseous distention of bowel may reflect ileus.  Right coronary artery calcifications.   Electronically Signed   By: Rolm Baptise M.D.   On: 08/31/2014 21:56

## 2014-09-09 ENCOUNTER — Encounter (HOSPITAL_COMMUNITY): Payer: Self-pay | Admitting: *Deleted

## 2014-09-09 ENCOUNTER — Inpatient Hospital Stay (HOSPITAL_COMMUNITY)
Admission: EM | Admit: 2014-09-09 | Discharge: 2014-09-14 | DRG: 439 | Disposition: A | Discharge status: Skilled Nursing Facility | Payer: Medicare Other | Attending: Internal Medicine | Admitting: Internal Medicine

## 2014-09-09 DIAGNOSIS — Z7982 Long term (current) use of aspirin: Secondary | ICD-10-CM

## 2014-09-09 DIAGNOSIS — Z888 Allergy status to other drugs, medicaments and biological substances status: Secondary | ICD-10-CM

## 2014-09-09 DIAGNOSIS — E785 Hyperlipidemia, unspecified: Secondary | ICD-10-CM | POA: Diagnosis present

## 2014-09-09 DIAGNOSIS — I2510 Atherosclerotic heart disease of native coronary artery without angina pectoris: Secondary | ICD-10-CM | POA: Diagnosis present

## 2014-09-09 DIAGNOSIS — K859 Acute pancreatitis without necrosis or infection, unspecified: Secondary | ICD-10-CM

## 2014-09-09 DIAGNOSIS — IMO0002 Reserved for concepts with insufficient information to code with codable children: Secondary | ICD-10-CM | POA: Insufficient documentation

## 2014-09-09 DIAGNOSIS — E7800 Pure hypercholesterolemia, unspecified: Secondary | ICD-10-CM | POA: Diagnosis present

## 2014-09-09 DIAGNOSIS — K219 Gastro-esophageal reflux disease without esophagitis: Secondary | ICD-10-CM | POA: Diagnosis present

## 2014-09-09 DIAGNOSIS — G40909 Epilepsy, unspecified, not intractable, without status epilepticus: Secondary | ICD-10-CM | POA: Diagnosis present

## 2014-09-09 DIAGNOSIS — I5032 Chronic diastolic (congestive) heart failure: Secondary | ICD-10-CM | POA: Diagnosis present

## 2014-09-09 DIAGNOSIS — K860 Alcohol-induced chronic pancreatitis: Secondary | ICD-10-CM | POA: Diagnosis not present

## 2014-09-09 DIAGNOSIS — R109 Unspecified abdominal pain: Secondary | ICD-10-CM

## 2014-09-09 DIAGNOSIS — F329 Major depressive disorder, single episode, unspecified: Secondary | ICD-10-CM | POA: Diagnosis present

## 2014-09-09 DIAGNOSIS — D649 Anemia, unspecified: Secondary | ICD-10-CM | POA: Diagnosis present

## 2014-09-09 DIAGNOSIS — K861 Other chronic pancreatitis: Secondary | ICD-10-CM | POA: Diagnosis not present

## 2014-09-09 DIAGNOSIS — Z882 Allergy status to sulfonamides status: Secondary | ICD-10-CM

## 2014-09-09 DIAGNOSIS — F209 Schizophrenia, unspecified: Secondary | ICD-10-CM | POA: Diagnosis present

## 2014-09-09 DIAGNOSIS — K831 Obstruction of bile duct: Secondary | ICD-10-CM

## 2014-09-09 DIAGNOSIS — E871 Hypo-osmolality and hyponatremia: Secondary | ICD-10-CM | POA: Diagnosis present

## 2014-09-09 DIAGNOSIS — Z7902 Long term (current) use of antithrombotics/antiplatelets: Secondary | ICD-10-CM

## 2014-09-09 DIAGNOSIS — R569 Unspecified convulsions: Secondary | ICD-10-CM

## 2014-09-09 DIAGNOSIS — G8929 Other chronic pain: Secondary | ICD-10-CM | POA: Diagnosis present

## 2014-09-09 DIAGNOSIS — F17210 Nicotine dependence, cigarettes, uncomplicated: Secondary | ICD-10-CM | POA: Diagnosis present

## 2014-09-09 DIAGNOSIS — J45909 Unspecified asthma, uncomplicated: Secondary | ICD-10-CM | POA: Diagnosis present

## 2014-09-09 DIAGNOSIS — I509 Heart failure, unspecified: Secondary | ICD-10-CM

## 2014-09-09 DIAGNOSIS — Z8674 Personal history of sudden cardiac arrest: Secondary | ICD-10-CM

## 2014-09-09 DIAGNOSIS — F1010 Alcohol abuse, uncomplicated: Secondary | ICD-10-CM | POA: Diagnosis present

## 2014-09-09 DIAGNOSIS — K709 Alcoholic liver disease, unspecified: Secondary | ICD-10-CM | POA: Diagnosis present

## 2014-09-09 DIAGNOSIS — Z955 Presence of coronary angioplasty implant and graft: Secondary | ICD-10-CM

## 2014-09-09 DIAGNOSIS — Z79899 Other long term (current) drug therapy: Secondary | ICD-10-CM

## 2014-09-09 DIAGNOSIS — I10 Essential (primary) hypertension: Secondary | ICD-10-CM | POA: Diagnosis present

## 2014-09-09 DIAGNOSIS — I252 Old myocardial infarction: Secondary | ICD-10-CM

## 2014-09-09 DIAGNOSIS — Z79891 Long term (current) use of opiate analgesic: Secondary | ICD-10-CM

## 2014-09-09 DIAGNOSIS — K8689 Other specified diseases of pancreas: Secondary | ICD-10-CM | POA: Diagnosis present

## 2014-09-09 LAB — DIFFERENTIAL
BASOS ABS: 0 10*3/uL (ref 0.0–0.1)
Basophils Relative: 0 % (ref 0–1)
EOS ABS: 0.8 10*3/uL — AB (ref 0.0–0.7)
Eosinophils Relative: 9 % — ABNORMAL HIGH (ref 0–5)
LYMPHS ABS: 1.9 10*3/uL (ref 0.7–4.0)
Lymphocytes Relative: 22 % (ref 12–46)
MONOS PCT: 7 % (ref 3–12)
Monocytes Absolute: 0.6 10*3/uL (ref 0.1–1.0)
Neutro Abs: 5.5 10*3/uL (ref 1.7–7.7)
Neutrophils Relative %: 62 % (ref 43–77)

## 2014-09-09 LAB — CBC
HEMATOCRIT: 30.8 % — AB (ref 39.0–52.0)
HEMOGLOBIN: 10.6 g/dL — AB (ref 13.0–17.0)
MCH: 31 pg (ref 26.0–34.0)
MCHC: 34.4 g/dL (ref 30.0–36.0)
MCV: 90.1 fL (ref 78.0–100.0)
Platelets: 370 10*3/uL (ref 150–400)
RBC: 3.42 MIL/uL — ABNORMAL LOW (ref 4.22–5.81)
RDW: 15.7 % — ABNORMAL HIGH (ref 11.5–15.5)
WBC: 8.9 10*3/uL (ref 4.0–10.5)

## 2014-09-09 LAB — COMPREHENSIVE METABOLIC PANEL
ALBUMIN: 3 g/dL — AB (ref 3.5–5.0)
ALT: 75 U/L — ABNORMAL HIGH (ref 17–63)
ANION GAP: 9 (ref 5–15)
AST: 66 U/L — ABNORMAL HIGH (ref 15–41)
Alkaline Phosphatase: 859 U/L — ABNORMAL HIGH (ref 38–126)
BILIRUBIN TOTAL: 7.7 mg/dL — AB (ref 0.3–1.2)
BUN: 19 mg/dL (ref 6–20)
CHLORIDE: 93 mmol/L — AB (ref 101–111)
CO2: 27 mmol/L (ref 22–32)
Calcium: 9 mg/dL (ref 8.9–10.3)
Creatinine, Ser: 0.93 mg/dL (ref 0.61–1.24)
GFR calc Af Amer: >60 mL/min (ref >60)
GFR calc non Af Amer: >60 mL/min (ref >60)
GLUCOSE: 109 mg/dL — AB (ref 65–99)
POTASSIUM: 4.6 mmol/L (ref 3.5–5.1)
SODIUM: 129 mmol/L — AB (ref 135–145)
TOTAL PROTEIN: 6.6 g/dL (ref 6.5–8.1)

## 2014-09-09 LAB — LIPASE, BLOOD: LIPASE: 62 U/L — AB (ref 22–51)

## 2014-09-09 MED ORDER — MORPHINE SULFATE (PF) 4 MG/ML IV SOLN
4.0000 mg | Freq: Once | INTRAVENOUS | Status: AC
  Administered 2014-09-09: 4 mg via INTRAVENOUS
  Filled 2014-09-09: qty 1

## 2014-09-09 MED ORDER — SODIUM CHLORIDE 0.9 % IV SOLN
1000.0000 mL | Freq: Once | INTRAVENOUS | Status: AC
Start: 2014-09-09 — End: 2014-09-09
  Administered 2014-09-09: 1000 mL via INTRAVENOUS

## 2014-09-09 MED ORDER — SODIUM CHLORIDE 0.9 % IV SOLN
1000.0000 mL | INTRAVENOUS | Status: DC
  Administered 2014-09-09: 1000 mL via INTRAVENOUS

## 2014-09-09 NOTE — ED Notes (Signed)
Pt c/o generalized abd pain. Rates it a 10/10 and states that morphine helps it.

## 2014-09-09 NOTE — ED Provider Notes (Signed)
CSN: [id number]     Arrival date & time 09/09/14  2126 History   None    Chief Complaint  Patient presents with  . Abdominal Pain     (Consider location/radiation/quality/duration/timing/severity/associated sxs/prior Treatment) Patient is a 49 y.o. male presenting with abdominal pain. The history is provided by the patient.  Abdominal Pain He complains of diffuse abdominal pain for the last 2 weeks which is getting worse. He rates pain at 10/10. There is no radiation of pain to the back, chest, shoulder. He denies nausea or vomiting and denies fever or chills. He denies ethanol consumption. He has been taking acetaminophen for pain without relief. He states that morphine and hydrocodone have been the only things that help his pain.  Past Medical History  Diagnosis Date  . Hypertension   . Asthma   . GERD (gastroesophageal reflux disease)   . Chronic pain   . Schizophrenia   . Alcohol abuse   . Seizures 07/2013    seizure like activity following cardiac/resp arrest.   . 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 Depression   . CAD (coronary artery disease)   . Cardiac arrest 07/2013    with acute MI and secondary respiratory arrest  . Protein calorie malnutrition 07/2013   Past Surgical History  Procedure Laterality Date  . Left heart catheterization with coronary angiogram N/A 07/06/2013    Procedure: LEFT HEART CATHETERIZATION WITH CORONARY ANGIOGRAM;  Surgeon: Clent Demark, MD;  Location: [facility] CATH LAB;  Service: Cardiovascular;  Laterality: N/A;  . Percutaneous coronary stent intervention (pci-s)  07/06/2013    Procedure: PERCUTANEOUS CORONARY STENT INTERVENTION (PCI-S);  Surgeon: Clent Demark, MD;  Location: [facility] CATH LAB;  Service: Cardiovascular;;  . Tracheostomy  07/2013   Family History  Problem Relation Age of Onset  . Malignant hyperthermia Mother   . Cirrhosis Father   . Alcohol abuse Father    Social History  Substance Use Topics  . Smoking status: Current  Every Day Smoker -- 2.00 packs/day    Types: Cigarettes  . Smokeless tobacco: Not on file  . Alcohol Use: 0.0 oz/week    0 Standard drinks or equivalent per week     Comment: daily heavy drinker, 80 ounces every 1-2 days. for years    Review of Systems  Gastrointestinal: Positive for abdominal pain.  All other systems reviewed and are negative.     Allergies  Hctz; Hydroxyzine; Sulfonamide derivatives; and Cetirizine & related  Home Medications   Prior to Admission medications   Medication Sig Start Date End Date Taking? Authorizing Provider  aspirin 81 MG chewable tablet Chew 1 tablet (81 mg total) by mouth daily. 11/23/13   Kristen N Mcauliff, DO  atorvastatin (LIPITOR) 80 MG tablet Take 1 tablet (80 mg total) by mouth daily. Patient taking differently: Take 80 mg by mouth at bedtime.  02/13/14   Nicole Pisciotta, PA-C  benztropine (COGENTIN) 0.5 MG tablet Take 1 tablet (0.5 mg total) by mouth 2 (two) times daily. 08/09/14   Nita Sells, MD  clopidogrel (PLAVIX) 75 MG tablet Take 75 mg by mouth daily. 06/11/14   Historical Provider, MD  divalproex (DEPAKOTE) 500 MG DR tablet Take 1 tablet (500 mg total) by mouth 2 (two) times daily. 08/09/14   Nita Sells, MD  feeding supplement, ENSURE ENLIVE, (ENSURE ENLIVE) LIQD Take 237 mLs by mouth 2 (two) times daily between meals. 06/11/14   Eugenie Filler, MD  folic acid (FOLVITE) 1 MG tablet Take  1 tablet (1 mg total) by mouth daily. 06/11/14   Eugenie Filler, MD  furosemide (LASIX) 40 MG tablet Take 40 mg by mouth daily. 07/18/14   Historical Provider, MD  haloperidol decanoate (HALDOL DECANOATE) 100 MG/ML injection Inject 1 mL (100 mg total) into the muscle every 28 (twenty-eight) days. 08/09/14   Nita Sells, MD  hydrALAZINE (APRESOLINE) 10 MG tablet Take 1 tablet (10 mg total) by mouth 2 (two) times daily. 02/13/14   Nicole Pisciotta, PA-C  levETIRAcetam (KEPPRA) 1000 MG tablet Take 1 tablet (1,000 mg total) by mouth 2 (two)  times daily. 08/09/14   Nita Sells, MD  lisinopril (PRINIVIL,ZESTRIL) 2.5 MG tablet Take 2.5 mg by mouth daily.    Historical Provider, MD  LORazepam (ATIVAN) 0.5 MG tablet TAKE 1 TABLET BY MOUTH EVERY EVENING AS NEEDED 08/09/14   Historical Provider, MD  meloxicam (MOBIC) 15 MG tablet Take 15 mg by mouth daily.    Historical Provider, MD  metoprolol tartrate (LOPRESSOR) 25 MG tablet Take 0.5 tablets (12.5 mg total) by mouth 2 (two) times daily. Patient taking differently: Take 25 mg by mouth daily.  02/13/14   Elmyra Ricks Pisciotta, PA-C  oxyCODONE (OXY IR/ROXICODONE) 5 MG immediate release tablet one every four hours prn 08/20/14   Historical Provider, MD  Pancrelipase, Lip-Prot-Amyl, 24000 UNITS CPEP Two PO with meals (TID) and one PO with snacks (BID). Total of 8 daily. 09/06/14   Mahala Menghini, PA-C  polyethylene glycol (MIRALAX / GLYCOLAX) packet Take 17 g by mouth daily.    Historical Provider, MD  QUEtiapine (SEROQUEL) 50 MG tablet Take 1 tablet (50 mg total) by mouth at bedtime. 08/09/14   Nita Sells, MD  thiamine 100 MG tablet Take 1 tablet (100 mg total) by mouth daily. 06/11/14   Eugenie Filler, MD  traZODone (DESYREL) 100 MG tablet Take 1 tablet (100 mg total) by mouth at bedtime. 08/09/14   Nita Sells, MD  vitamin B-12 500 MCG tablet Take 1 tablet (500 mcg total) by mouth daily. 07/02/14   Orson Eva, MD   BP 110/64 mmHg  Pulse 73  Temp(Src) 98.5 F (36.9 C) (Oral)  Resp 18  Ht 6' (1.829 m)  Wt 142 lb (64.411 kg)  BMI 19.25 kg/m2  SpO2 99% Physical Exam  Nursing note and vitals reviewed.  49 year old male, resting comfortably and in no acute distress. Vital signs are normal. Oxygen saturation is 99%, which is normal. Head is normocephalic and atraumatic. PERRLA, EOMI. Scleral icterus is present. Oropharynx is clear. Neck is nontender and supple without adenopathy or JVD. Back is nontender and there is no CVA tenderness. Lungs are clear without rales, wheezes, or  rhonchi. Chest is nontender. Heart has regular rate and rhythm without murmur. Abdomen is soft, flat, with diffuse tenderness. There is no rebound or guarding. There are no masses or hepatosplenomegaly and peristalsis is normoactive. Extremities have no cyanosis or edema, full range of motion is present. Skin is warm and dry without rash. Neurologic: Mental status is normal except for flat affect, cranial nerves are intact, there are no motor or sensory deficits.   ED Course  Procedures (including critical care time) Labs Review Results for orders placed or performed during the hospital encounter of 09/09/14  Lipase, blood  Result Value Ref Range   Lipase 62 (H) 22 - 51 U/L  Comprehensive metabolic panel  Result Value Ref Range   Sodium 129 (L) 135 - 145 mmol/L   Potassium 4.6 3.5 -  5.1 mmol/L   Chloride 93 (L) 101 - 111 mmol/L   CO2 27 22 - 32 mmol/L   Glucose, Bld 109 (H) 65 - 99 mg/dL   BUN 19 6 - 20 mg/dL   Creatinine, Ser 0.93 0.61 - 1.24 mg/dL   Calcium 9.0 8.9 - 10.3 mg/dL   Total Protein 6.6 6.5 - 8.1 g/dL   Albumin 3.0 (L) 3.5 - 5.0 g/dL   AST 66 (H) 15 - 41 U/L   ALT 75 (H) 17 - 63 U/L   Alkaline Phosphatase 859 (H) 38 - 126 U/L   Total Bilirubin 7.7 (H) 0.3 - 1.2 mg/dL   GFR calc non Af Amer >60 >60 mL/min   GFR calc Af Amer >60 >60 mL/min   Anion gap 9 5 - 15  CBC  Result Value Ref Range   WBC 8.9 4.0 - 10.5 K/uL   RBC 3.42 (L) 4.22 - 5.81 MIL/uL   Hemoglobin 10.6 (L) 13.0 - 17.0 g/dL   HCT 30.8 (L) 39.0 - 52.0 %   MCV 90.1 78.0 - 100.0 fL   MCH 31.0 26.0 - 34.0 pg   MCHC 34.4 30.0 - 36.0 g/dL   RDW 15.7 (H) 11.5 - 15.5 %   Platelets 370 150 - 400 K/uL  Differential  Result Value Ref Range   Neutrophils Relative % 62 43 - 77 %   Neutro Abs 5.5 1.7 - 7.7 K/uL   Lymphocytes Relative 22 12 - 46 %   Lymphs Abs 1.9 0.7 - 4.0 K/uL   Monocytes Relative 7 3 - 12 %   Monocytes Absolute 0.6 0.1 - 1.0 K/uL   Eosinophils Relative 9 (H) 0 - 5 %   Eosinophils  Absolute 0.8 (H) 0.0 - 0.7 K/uL   Basophils Relative 0 0 - 1 %   Basophils Absolute 0.0 0.0 - 0.1 K/uL   I have personally reviewed and evaluated these lab results as part of my medical decision-making.   MDM   Final diagnoses:  Recurrent pancreatitis  Obstructive jaundice  Hyponatremia  Normochromic normocytic anemia    Generalized abdominal pain. Old records reviewed and he has several recent hospitalizations for pancreatitis. It is also noted that he saw a gastroenterologist recently because of elevated bilirubin and is supposed to be sent for ERCP and endoscopic ultrasound. CT scan showed progressive dilatation of biliary and pancreatic ducts. Labs will be repeated today and is given IV fluids and morphine for pain.  He had modest relief of pain with morphine. Laboratory workup shows persistent obstructive jaundice with mild elevation of transaminases. Lipase had been normal and is now elevated. Hemoglobin has dropped slightly as has sodium. Case is discussed with Dr. Humphrey Rolls of [facility] who agrees to admit the patient.  Delora Fuel, MD 70/01/74 [id number]

## 2014-09-10 ENCOUNTER — Encounter (HOSPITAL_COMMUNITY): Payer: Self-pay | Admitting: General Practice

## 2014-09-10 ENCOUNTER — Inpatient Hospital Stay (HOSPITAL_COMMUNITY): Payer: Medicare Other

## 2014-09-10 DIAGNOSIS — R569 Unspecified convulsions: Secondary | ICD-10-CM

## 2014-09-10 DIAGNOSIS — Z7982 Long term (current) use of aspirin: Secondary | ICD-10-CM | POA: Diagnosis not present

## 2014-09-10 DIAGNOSIS — K709 Alcoholic liver disease, unspecified: Secondary | ICD-10-CM | POA: Diagnosis not present

## 2014-09-10 DIAGNOSIS — Z79891 Long term (current) use of opiate analgesic: Secondary | ICD-10-CM | POA: Diagnosis not present

## 2014-09-10 DIAGNOSIS — K831 Obstruction of bile duct: Secondary | ICD-10-CM | POA: Diagnosis not present

## 2014-09-10 DIAGNOSIS — Z882 Allergy status to sulfonamides status: Secondary | ICD-10-CM | POA: Diagnosis not present

## 2014-09-10 DIAGNOSIS — K838 Other specified diseases of biliary tract: Secondary | ICD-10-CM | POA: Diagnosis not present

## 2014-09-10 DIAGNOSIS — F1010 Alcohol abuse, uncomplicated: Secondary | ICD-10-CM | POA: Diagnosis not present

## 2014-09-10 DIAGNOSIS — F209 Schizophrenia, unspecified: Secondary | ICD-10-CM | POA: Diagnosis not present

## 2014-09-10 DIAGNOSIS — E871 Hypo-osmolality and hyponatremia: Secondary | ICD-10-CM | POA: Diagnosis present

## 2014-09-10 DIAGNOSIS — G40909 Epilepsy, unspecified, not intractable, without status epilepticus: Secondary | ICD-10-CM | POA: Diagnosis not present

## 2014-09-10 DIAGNOSIS — I2510 Atherosclerotic heart disease of native coronary artery without angina pectoris: Secondary | ICD-10-CM | POA: Diagnosis not present

## 2014-09-10 DIAGNOSIS — G8929 Other chronic pain: Secondary | ICD-10-CM | POA: Diagnosis not present

## 2014-09-10 DIAGNOSIS — E780 Pure hypercholesterolemia: Secondary | ICD-10-CM | POA: Diagnosis not present

## 2014-09-10 DIAGNOSIS — K860 Alcohol-induced chronic pancreatitis: Secondary | ICD-10-CM | POA: Diagnosis present

## 2014-09-10 DIAGNOSIS — F329 Major depressive disorder, single episode, unspecified: Secondary | ICD-10-CM | POA: Diagnosis not present

## 2014-09-10 DIAGNOSIS — K861 Other chronic pancreatitis: Secondary | ICD-10-CM | POA: Diagnosis present

## 2014-09-10 DIAGNOSIS — I10 Essential (primary) hypertension: Secondary | ICD-10-CM | POA: Diagnosis not present

## 2014-09-10 DIAGNOSIS — D649 Anemia, unspecified: Secondary | ICD-10-CM | POA: Diagnosis not present

## 2014-09-10 DIAGNOSIS — R1013 Epigastric pain: Secondary | ICD-10-CM | POA: Diagnosis not present

## 2014-09-10 DIAGNOSIS — Z888 Allergy status to other drugs, medicaments and biological substances status: Secondary | ICD-10-CM | POA: Diagnosis not present

## 2014-09-10 DIAGNOSIS — Z7902 Long term (current) use of antithrombotics/antiplatelets: Secondary | ICD-10-CM | POA: Diagnosis not present

## 2014-09-10 DIAGNOSIS — J45909 Unspecified asthma, uncomplicated: Secondary | ICD-10-CM | POA: Diagnosis not present

## 2014-09-10 DIAGNOSIS — Z8674 Personal history of sudden cardiac arrest: Secondary | ICD-10-CM | POA: Diagnosis not present

## 2014-09-10 DIAGNOSIS — R1084 Generalized abdominal pain: Secondary | ICD-10-CM | POA: Diagnosis not present

## 2014-09-10 DIAGNOSIS — I252 Old myocardial infarction: Secondary | ICD-10-CM | POA: Diagnosis not present

## 2014-09-10 DIAGNOSIS — Z955 Presence of coronary angioplasty implant and graft: Secondary | ICD-10-CM | POA: Diagnosis not present

## 2014-09-10 DIAGNOSIS — I5032 Chronic diastolic (congestive) heart failure: Secondary | ICD-10-CM

## 2014-09-10 DIAGNOSIS — K219 Gastro-esophageal reflux disease without esophagitis: Secondary | ICD-10-CM | POA: Diagnosis not present

## 2014-09-10 DIAGNOSIS — Z79899 Other long term (current) drug therapy: Secondary | ICD-10-CM | POA: Diagnosis not present

## 2014-09-10 DIAGNOSIS — R109 Unspecified abdominal pain: Secondary | ICD-10-CM | POA: Diagnosis not present

## 2014-09-10 DIAGNOSIS — F17210 Nicotine dependence, cigarettes, uncomplicated: Secondary | ICD-10-CM | POA: Diagnosis not present

## 2014-09-10 DIAGNOSIS — E785 Hyperlipidemia, unspecified: Secondary | ICD-10-CM | POA: Diagnosis not present

## 2014-09-10 LAB — COMPREHENSIVE METABOLIC PANEL
ALK PHOS: 819 U/L — AB (ref 38–126)
ALT: 71 U/L — ABNORMAL HIGH (ref 17–63)
AST: 67 U/L — AB (ref 15–41)
Albumin: 2.7 g/dL — ABNORMAL LOW (ref 3.5–5.0)
Anion gap: 8 (ref 5–15)
BILIRUBIN TOTAL: 7.1 mg/dL — AB (ref 0.3–1.2)
BUN: 18 mg/dL (ref 6–20)
CALCIUM: 8.8 mg/dL — AB (ref 8.9–10.3)
CO2: 24 mmol/L (ref 22–32)
CREATININE: 0.76 mg/dL (ref 0.61–1.24)
Chloride: 100 mmol/L — ABNORMAL LOW (ref 101–111)
GFR calc Af Amer: >60 mL/min (ref >60)
Glucose, Bld: 77 mg/dL (ref 65–99)
POTASSIUM: 4.5 mmol/L (ref 3.5–5.1)
Sodium: 132 mmol/L — ABNORMAL LOW (ref 135–145)
TOTAL PROTEIN: 5.9 g/dL — AB (ref 6.5–8.1)

## 2014-09-10 LAB — URINALYSIS, ROUTINE W REFLEX MICROSCOPIC
Glucose, UA: NEGATIVE mg/dL
Hgb urine dipstick: NEGATIVE
Ketones, ur: NEGATIVE mg/dL
Leukocytes, UA: NEGATIVE
NITRITE: NEGATIVE
PROTEIN: NEGATIVE mg/dL
SPECIFIC GRAVITY, URINE: 1.022 (ref 1.005–1.030)
UROBILINOGEN UA: 0.2 mg/dL (ref 0.0–1.0)
pH: 7 (ref 5.0–8.0)

## 2014-09-10 LAB — CBC
HEMATOCRIT: 29.9 % — AB (ref 39.0–52.0)
Hemoglobin: 10.1 g/dL — ABNORMAL LOW (ref 13.0–17.0)
MCH: 30.5 pg (ref 26.0–34.0)
MCHC: 33.8 g/dL (ref 30.0–36.0)
MCV: 90.3 fL (ref 78.0–100.0)
Platelets: 335 10*3/uL (ref 150–400)
RBC: 3.31 MIL/uL — ABNORMAL LOW (ref 4.22–5.81)
RDW: 15.7 % — AB (ref 11.5–15.5)
WBC: 7.2 10*3/uL (ref 4.0–10.5)

## 2014-09-10 LAB — APTT: aPTT: 33 seconds (ref 24–37)

## 2014-09-10 LAB — AMMONIA: Ammonia: 44 umol/L — ABNORMAL HIGH (ref 9–35)

## 2014-09-10 LAB — PROTIME-INR
INR: 1.08 (ref 0.00–1.49)
PROTHROMBIN TIME: 14.2 s (ref 11.6–15.2)

## 2014-09-10 LAB — TSH: TSH: 5.329 u[IU]/mL — AB (ref 0.350–4.500)

## 2014-09-10 LAB — GLUCOSE, CAPILLARY: Glucose-Capillary: 79 mg/dL (ref 65–99)

## 2014-09-10 MED ORDER — HYDRALAZINE HCL 10 MG PO TABS
10.0000 mg | ORAL_TABLET | Freq: Two times a day (BID) | ORAL | Status: DC
  Administered 2014-09-10 – 2014-09-12 (×6): 10 mg via ORAL
  Filled 2014-09-10 (×6): qty 1

## 2014-09-10 MED ORDER — IOHEXOL 350 MG/ML SOLN
100.0000 mL | Freq: Once | INTRAVENOUS | Status: AC | PRN
  Administered 2014-09-10: 100 mL via INTRAVENOUS

## 2014-09-10 MED ORDER — TRAZODONE HCL 100 MG PO TABS
100.0000 mg | ORAL_TABLET | Freq: Every day | ORAL | Status: DC
  Administered 2014-09-10 – 2014-09-13 (×5): 100 mg via ORAL
  Filled 2014-09-10 (×5): qty 1

## 2014-09-10 MED ORDER — FOLIC ACID 1 MG PO TABS
1.0000 mg | ORAL_TABLET | Freq: Every day | ORAL | Status: DC
  Administered 2014-09-10 – 2014-09-14 (×5): 1 mg via ORAL
  Filled 2014-09-10 (×6): qty 1

## 2014-09-10 MED ORDER — LORAZEPAM 0.5 MG PO TABS
0.5000 mg | ORAL_TABLET | Freq: Every evening | ORAL | Status: DC | PRN
  Administered 2014-09-10 – 2014-09-11 (×3): 0.5 mg via ORAL
  Filled 2014-09-10 (×4): qty 1

## 2014-09-10 MED ORDER — ENSURE ENLIVE PO LIQD
237.0000 mL | Freq: Two times a day (BID) | ORAL | Status: DC
  Administered 2014-09-10 – 2014-09-14 (×9): 237 mL via ORAL

## 2014-09-10 MED ORDER — LACTULOSE 10 GM/15ML PO SOLN
20.0000 g | Freq: Two times a day (BID) | ORAL | Status: DC
  Administered 2014-09-10 – 2014-09-14 (×9): 20 g via ORAL
  Filled 2014-09-10 (×9): qty 30

## 2014-09-10 MED ORDER — VITAMIN B-1 100 MG PO TABS: 100.0000 mg | ORAL_TABLET | Freq: Every day | ORAL | Status: DC

## 2014-09-10 MED ORDER — VITAMIN B-12 100 MCG PO TABS
500.0000 ug | ORAL_TABLET | Freq: Every day | ORAL | Status: DC
  Administered 2014-09-10 – 2014-09-14 (×5): 500 ug via ORAL
  Filled 2014-09-10 (×6): qty 5

## 2014-09-10 MED ORDER — FOLIC ACID 1 MG PO TABS: 1.0000 mg | ORAL_TABLET | Freq: Every day | ORAL | Status: DC

## 2014-09-10 MED ORDER — FUROSEMIDE 40 MG PO TABS
40.0000 mg | ORAL_TABLET | Freq: Every day | ORAL | Status: DC
  Filled 2014-09-10: qty 1

## 2014-09-10 MED ORDER — ONDANSETRON HCL 4 MG PO TABS: 4.0000 mg | ORAL_TABLET | Freq: Four times a day (QID) | ORAL | Status: DC | PRN

## 2014-09-10 MED ORDER — LEVETIRACETAM 500 MG PO TABS
1000.0000 mg | ORAL_TABLET | Freq: Two times a day (BID) | ORAL | Status: DC
  Administered 2014-09-10 – 2014-09-14 (×9): 1000 mg via ORAL
  Filled 2014-09-10 (×9): qty 2

## 2014-09-10 MED ORDER — ASPIRIN 81 MG PO CHEW
81.0000 mg | CHEWABLE_TABLET | Freq: Every day | ORAL | Status: DC
  Administered 2014-09-10 – 2014-09-14 (×5): 81 mg via ORAL
  Filled 2014-09-10 (×6): qty 1

## 2014-09-10 MED ORDER — VITAMIN B-1 100 MG PO TABS
100.0000 mg | ORAL_TABLET | Freq: Every day | ORAL | Status: DC
  Administered 2014-09-10 – 2014-09-14 (×5): 100 mg via ORAL
  Filled 2014-09-10 (×6): qty 1

## 2014-09-10 MED ORDER — QUETIAPINE FUMARATE 50 MG PO TABS
50.0000 mg | ORAL_TABLET | Freq: Every day | ORAL | Status: DC
  Administered 2014-09-10 – 2014-09-13 (×5): 50 mg via ORAL
  Filled 2014-09-10 (×5): qty 1

## 2014-09-10 MED ORDER — METOPROLOL TARTRATE 25 MG PO TABS
25.0000 mg | ORAL_TABLET | Freq: Every day | ORAL | Status: DC
  Filled 2014-09-10: qty 1

## 2014-09-10 MED ORDER — HALOPERIDOL DECANOATE 100 MG/ML IM SOLN: 100.0000 mg | INTRAMUSCULAR | Status: DC

## 2014-09-10 MED ORDER — POLYETHYLENE GLYCOL 3350 17 G PO PACK
17.0000 g | PACK | Freq: Every day | ORAL | Status: DC
  Administered 2014-09-10 – 2014-09-14 (×4): 17 g via ORAL
  Filled 2014-09-10 (×5): qty 1

## 2014-09-10 MED ORDER — NICOTINE 21 MG/24HR TD PT24
21.0000 mg | MEDICATED_PATCH | Freq: Every day | TRANSDERMAL | Status: DC
  Administered 2014-09-10 – 2014-09-14 (×5): 21 mg via TRANSDERMAL
  Filled 2014-09-10 (×5): qty 1

## 2014-09-10 MED ORDER — MAGNESIUM CITRATE PO SOLN
1.0000 | Freq: Once | ORAL | Status: AC
  Administered 2014-09-10: 1 via ORAL
  Filled 2014-09-10: qty 296

## 2014-09-10 MED ORDER — MELOXICAM 15 MG PO TABS
15.0000 mg | ORAL_TABLET | Freq: Every day | ORAL | Status: DC
Start: 2014-09-10 — End: 2014-09-10
  Filled 2014-09-10: qty 1

## 2014-09-10 MED ORDER — CLOPIDOGREL BISULFATE 75 MG PO TABS: 75.0000 mg | ORAL_TABLET | Freq: Every day | ORAL | Status: DC

## 2014-09-10 MED ORDER — DIVALPROEX SODIUM 500 MG PO DR TAB
500.0000 mg | DELAYED_RELEASE_TABLET | Freq: Two times a day (BID) | ORAL | Status: DC
  Administered 2014-09-10 – 2014-09-14 (×9): 500 mg via ORAL
  Filled 2014-09-10 (×12): qty 1

## 2014-09-10 MED ORDER — SODIUM CHLORIDE 0.9 % IJ SOLN
3.0000 mL | Freq: Two times a day (BID) | INTRAMUSCULAR | Status: DC
  Administered 2014-09-10: 13:00:00 via INTRAVENOUS
  Administered 2014-09-11: 3 mL via INTRAVENOUS

## 2014-09-10 MED ORDER — OXYCODONE HCL 5 MG PO TABS
5.0000 mg | ORAL_TABLET | ORAL | Status: DC | PRN
  Administered 2014-09-10 – 2014-09-14 (×17): 5 mg via ORAL
  Filled 2014-09-10 (×17): qty 1

## 2014-09-10 MED ORDER — ONDANSETRON HCL 4 MG/2ML IJ SOLN
4.0000 mg | Freq: Four times a day (QID) | INTRAMUSCULAR | Status: DC | PRN
  Administered 2014-09-13: 4 mg via INTRAVENOUS
  Filled 2014-09-10: qty 2

## 2014-09-10 MED ORDER — INFLUENZA VAC SPLIT QUAD 0.5 ML IM SUSY
0.5000 mL | PREFILLED_SYRINGE | INTRAMUSCULAR | Status: AC
  Administered 2014-09-11: 0.5 mL via INTRAMUSCULAR
  Filled 2014-09-10: qty 0.5

## 2014-09-10 MED ORDER — ADULT MULTIVITAMIN W/MINERALS CH
1.0000 | ORAL_TABLET | Freq: Every day | ORAL | Status: DC
  Administered 2014-09-10 – 2014-09-14 (×5): 1 via ORAL
  Filled 2014-09-10 (×6): qty 1

## 2014-09-10 MED ORDER — LISINOPRIL 5 MG PO TABS
2.5000 mg | ORAL_TABLET | Freq: Every day | ORAL | Status: DC
  Filled 2014-09-10: qty 1

## 2014-09-10 MED ORDER — SODIUM CHLORIDE 0.9 % IV BOLUS (SEPSIS)
1000.0000 mL | Freq: Once | INTRAVENOUS | Status: AC
  Administered 2014-09-10: 1000 mL via INTRAVENOUS

## 2014-09-10 MED ORDER — BENZTROPINE MESYLATE 0.5 MG PO TABS
0.5000 mg | ORAL_TABLET | Freq: Two times a day (BID) | ORAL | Status: DC
  Administered 2014-09-10 – 2014-09-14 (×9): 0.5 mg via ORAL
  Filled 2014-09-10 (×13): qty 1

## 2014-09-10 MED ORDER — PANCRELIPASE (LIP-PROT-AMYL) 12000-38000 UNITS PO CPEP
12000.0000 [IU] | ORAL_CAPSULE | Freq: Three times a day (TID) | ORAL | Status: DC
  Administered 2014-09-10 – 2014-09-14 (×11): 12000 [IU] via ORAL
  Filled 2014-09-10 (×12): qty 1

## 2014-09-10 MED ORDER — HEPARIN SODIUM (PORCINE) 5000 UNIT/ML IJ SOLN
5000.0000 [IU] | Freq: Three times a day (TID) | INTRAMUSCULAR | Status: AC
  Administered 2014-09-10 – 2014-09-12 (×8): 5000 [IU] via SUBCUTANEOUS
  Filled 2014-09-10 (×8): qty 1

## 2014-09-10 MED ORDER — SODIUM CHLORIDE 0.9 % IV SOLN
INTRAVENOUS | Status: DC
  Administered 2014-09-11 – 2014-09-14 (×3): via INTRAVENOUS

## 2014-09-10 NOTE — Progress Notes (Signed)
Initial Nutrition Assessment  DOCUMENTATION CODES:   Severe malnutrition in context of chronic illness  INTERVENTION:   Continue Ensure Enlive po BID, each supplement provides 350 kcal and 20 grams of protein.  Encourage adequate PO intake.   NUTRITION DIAGNOSIS:   Malnutrition related to chronic illness as evidenced by percent weight loss, severe depletion of body fat, severe depletion of muscle mass.  GOAL:   Patient will meet greater than or equal to 90% of their needs  MONITOR:   PO intake, Supplement acceptance, Weight trends, Labs, I & O's  REASON FOR ASSESSMENT:   Malnutrition Screening Tool    ASSESSMENT:   49 y.o. male with prior history of hypercholesterolemia pancreatitis HTN GERD schizophrenia ETOH abuse Seizure disorder presents with abdominal pain.  Pt reports having a decreased appetite which has been ongoing over the past 2 weeks. Pt reports he has only been able to consume 1 meal a day. Pt with weight loss. Per Epic weight records, pt with a 8% weight loss in 2 months. Pt has just been advanced to be diet. Ensure has been ordered and pt has been consuming them.   Nutrition-Focused physical exam completed. Findings are severe fat depletion, severe muscle depletion, and no edema.   Labs: Low sodium, chloride, and calcium. High ALT, AST, alkaline phosphatase, total bilirubin.   Diet Order:  Diet Heart Room service appropriate?: Yes; Fluid consistency:: Thin  Skin:  Reviewed, no issues  Last BM:  PTA  Height:   Ht Readings from Last 1 Encounters:  09/10/14 6' (1.829 m)    Weight:   Wt Readings from Last 1 Encounters:  09/10/14 138 lb 14.2 oz (63 kg)    Ideal Body Weight:  80.9 kg  BMI:  Body mass index is 18.83 kg/(m^2).  Estimated Nutritional Needs:   Kcal:  2050-2250  Protein:  100-110 grams  Fluid:  2-2.2 L/day  EDUCATION NEEDS:   No education needs identified at this time  Corrin Parker, MS, RD, LDN Pager # [phone] After  hours/ weekend pager # [phone]

## 2014-09-10 NOTE — Consult Note (Signed)
[facility] Consult: 12:11 PM 09/10/2014  LOS: 0 days    Referring Provider: Dr Waldron Labs.   Primary Care Physician:  [name], MD nursing home attending M.D.  Previously followed at [facility] in [place].  Primary Gastroenterologist:  Dr. Barney Drain in office recently, Dr. Fuller Plan in hospital recently    Reason for Consultation:  Jaundice, abnormal CT scan   HPI: Chase Blackwell is a 49 y.o. male.  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 Required trach, PEG, cardiac stent.  Ruled in for nonQ MI 06/2014. On Plavix.   01/2014 acute ETOH pancreatitis. CT with acute pancreatitis, mild PD dilation. Ultrasound with GB polyp vs sludge ball, 8 mm CBD without stone, but incompletely visualized. Left AMA. Initial CTs suggestive of acute pancreatitis, lipase 515 but LFTs normal. He did been admitted to total of 7 times, leaving AMA on most of these admissions.  Lipase 257. in early June, within 3 weeks it was up to 716. It was up to 644 on 07/30/14 but had steadily normalized back to 18 on 09/06/14. Initially LFTs were normal but since August they have been elevated, currently they are in a declining trend. Total bilirubin maximum 7.9, AST maximum 230, ALT maximum 276. Alkaline phosphatase max 870. Imaging studies of pancreas delayed on at least 3 admissions by his requiring tx for  Bed bugs/scabies.   MRCP 7/20.  Acute pancreatitis, no fluid collections. CBD 20 mm.  No choledocholithiasis.  Pancreatic duct irregular with abrupt tapering in the region of the pancreatic head) double duct sign) no obvious pancreatic head mass but follow-up EUS or repeat MRI within 3-4 weeks suggested to exclude occult  pancreatic head neoplasm. Cholelithiasis without cholecystitis. Repeat CT scan 08/31/14 showed progressive biliary ductal dilatation with an intra-and extrahepatic ducts. Gallbladder distended but stable. Pancreatic duct dilated. There was an region of low density at the pancreatic head, possibly reflecting edema from pancreatitis but unable to exclude mass lesion.  During his hospitalization ending 08/01/14, Dr. Lenoard Aden recommendation was for EUS/ERCP in 2-3 weeks following resolution of his pancreatitis.  Seen at Dr. Oneida Alar office 09/06/14, as his nursing home is in [place]. Dr. Oneida Alar plan was to refer him to [facility] for endoscopic ultrasound and ERCP to evaluate the head of the pancreas.  In the interim he has developed acutely worsening left upper quandrant abdominal pain and was readmitted to [place] yesterday. No nausea.  Says that pain never completely subsided but did accelerate PTA. He's had another CT abdomen pelvis today shows a 2.6 x 2.4 cm mass in the pancreatic head which is highly concerning for pancreatic adenocarcinoma. The mass abuts the proximal SMV but does not appear to invade the vessel. Part of the mass also adjacent to the gastroduodenal artery. Incidental finding of variant vascular anatomy of the liver noted.  Small possibly malignant peripancreatic lymph nodes. Gallbladder wall thickened with suspected sludge.   As noted above lipase is 62.  Alk phos has risen. Transaminases  are slightly improved compared with a few days ago. Total bilirubin at 7.1 is slightly diminished from 4 days ago.     Past Medical History  Diagnosis Date  . Hypertension   . Asthma   . GERD (gastroesophageal reflux disease)   . Chronic pain   . Schizophrenia   . Alcohol abuse   . Seizures 07/2013    seizure like activity following cardiac/resp arrest.   . 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 Depression   . CAD (coronary artery disease)   . Cardiac arrest 07/2013     with acute MI and secondary respiratory arrest  . Protein calorie malnutrition 07/2013    Past Surgical History  Procedure Laterality Date  . Left heart catheterization with coronary angiogram N/A 07/06/2013    Procedure: LEFT HEART CATHETERIZATION WITH CORONARY ANGIOGRAM;  Surgeon: Clent Demark, MD;  Location: [facility] CATH LAB;  Service: Cardiovascular;  Laterality: N/A;  . Percutaneous coronary stent intervention (pci-s)  07/06/2013    Procedure: PERCUTANEOUS CORONARY STENT INTERVENTION (PCI-S);  Surgeon: Clent Demark, MD;  Location: [facility] CATH LAB;  Service: Cardiovascular;;  . Tracheostomy  07/2013    Prior to Admission medications   Medication Sig Start Date End Date Taking? Authorizing Provider  aspirin 81 MG chewable tablet Chew 1 tablet (81 mg total) by mouth daily. 11/23/13  Yes Kristen N Mailloux, DO  benztropine (COGENTIN) 0.5 MG tablet Take 1 tablet (0.5 mg total) by mouth 2 (two) times daily. 08/09/14  Yes Nita Sells, MD  clopidogrel (PLAVIX) 75 MG tablet Take 75 mg by mouth daily. 06/11/14  Yes Historical Provider, MD  feeding supplement, ENSURE ENLIVE, (ENSURE ENLIVE) LIQD Take 237 mLs by mouth 2 (two) times daily between meals. 06/11/14  Yes Eugenie Filler, MD  folic acid (FOLVITE) 1 MG tablet Take 1 tablet (1 mg total) by mouth daily. 06/11/14  Yes Eugenie Filler, MD  furosemide (LASIX) 40 MG tablet Take 40 mg by mouth daily. 07/18/14  Yes Historical Provider, MD  haloperidol decanoate (HALDOL DECANOATE) 100 MG/ML injection Inject 1 mL (100 mg total) into the muscle every 28 (twenty-eight) days. 08/09/14  Yes Nita Sells, MD  hydrALAZINE (APRESOLINE) 10 MG tablet Take 1 tablet (10 mg total) by mouth 2 (two) times daily. 02/13/14  Yes Nicole Pisciotta, PA-C  levETIRAcetam (KEPPRA) 1000 MG tablet Take 1 tablet (1,000 mg total) by mouth 2 (two) times daily. 08/09/14  Yes Nita Sells, MD  lisinopril (PRINIVIL,ZESTRIL) 2.5 MG tablet Take 2.5 mg by mouth daily.   Yes  Historical Provider, MD  LORazepam (ATIVAN) 0.5 MG tablet TAKE 1 TABLET BY MOUTH EVERY EVENING AS NEEDED 08/09/14  Yes Historical Provider, MD  meloxicam (MOBIC) 15 MG tablet Take 15 mg by mouth daily.   Yes Historical Provider, MD  metoprolol tartrate (LOPRESSOR) 25 MG tablet Take 0.5 tablets (12.5 mg total) by mouth 2 (two) times daily. Patient taking differently: Take 25 mg by mouth daily.  02/13/14  Yes Nicole Pisciotta, PA-C  oxyCODONE (OXY IR/ROXICODONE) 5 MG immediate release tablet one every four hours prn 08/20/14  Yes Historical Provider, MD  Pancrelipase, Lip-Prot-Amyl, 24000 UNITS CPEP Two PO with meals (TID) and one PO with snacks (BID). Total of 8 daily. 09/06/14  Yes Mahala Menghini, PA-C  polyethylene glycol (MIRALAX / GLYCOLAX) packet Take 17 g by mouth daily.   Yes Historical Provider, MD  QUEtiapine (SEROQUEL) 50 MG tablet Take 1 tablet (50 mg total) by mouth at bedtime. 08/09/14  Yes Nita Sells, MD  thiamine 100 MG tablet Take 1 tablet (100 mg total) by mouth daily. 06/11/14  Yes Eugenie Filler, MD  traZODone (DESYREL) 100 MG tablet Take 1 tablet (100 mg total) by mouth at bedtime. 08/09/14  Yes Nita Sells, MD  vitamin B-12 500 MCG tablet Take 1 tablet (500 mcg total) by mouth daily. 07/02/14  Yes Orson Eva, MD  atorvastatin (LIPITOR) 80 MG tablet Take 1 tablet (80 mg total) by mouth daily. Patient not taking: Reported on 09/10/2014 02/13/14   Elmyra Ricks Pisciotta, PA-C    Scheduled Meds: . aspirin  81 mg Oral Daily  . benztropine  0.5 mg Oral BID  . clopidogrel  75 mg Oral Daily  . divalproex  500 mg Oral BID  . feeding supplement (ENSURE ENLIVE)  237 mL Oral BID BM  . folic acid  1 mg Oral Daily  . furosemide  40 mg Oral Daily  . hydrALAZINE  10 mg Oral BID  . [START ON 09/11/2014] Influenza vac split quadrivalent PF  0.5 mL Intramuscular Tomorrow-1000  . levETIRAcetam  1,000 mg Oral BID  . lipase/protease/amylase  12,000 Units Oral TID AC  . lisinopril  2.5 mg Oral  Daily  . magnesium citrate  1 Bottle Oral Once  . meloxicam  15 mg Oral Daily  . metoprolol tartrate  25 mg Oral Daily  . multivitamin with minerals  1 tablet Oral Daily  . polyethylene glycol  17 g Oral Daily  . QUEtiapine  50 mg Oral QHS  . sodium chloride  3 mL Intravenous Q12H  . thiamine  100 mg Oral Daily  . traZODone  100 mg Oral QHS  . vitamin B-12  500 mcg Oral Daily   Infusions: . sodium chloride 1,000 mL (09/09/14 2213)  . sodium chloride 50 mL/hr at 09/10/14 0126   PRN Meds: LORazepam, ondansetron **OR** ondansetron (ZOFRAN) IV, oxyCODONE   Allergies as of 09/09/2014 - Review Complete 09/09/2014  Allergen Reaction Noted  . Hctz [hydrochlorothiazide] Other (See Comments) 02/01/2013  . Hydroxyzine Hives 05/09/2011  . Sulfonamide derivatives Hives   . Cetirizine & related Other (See Comments) 05/09/2011    Family History  Problem Relation Age of Onset  . Malignant hyperthermia Mother   . Cirrhosis Father   . Alcohol abuse Father     Social History   Social History  . Marital Status: Single    Spouse Name: N/A  . Number of Children: 0  . Years of Education: N/A   Occupational History  . Not on file.   Social History Main Topics  . Smoking status: Current Every Day Smoker -- 2.00 packs/day    Types: Cigarettes  . Smokeless tobacco: Not on file  . Alcohol Use: 0.0 oz/week    0 Standard drinks or equivalent per week     Comment: daily heavy drinker, 80 ounces every 1-2 days. for years  . Drug Use: Yes    Special: Marijuana  . Sexual Activity: Not on file   Other Topics Concern  . Not on file   Social History Narrative    REVIEW OF SYSTEMS: Constitutional:  No weight loss since the end of July. Unable to assess his ambulatory status at his sniff but the nurse says that since he arrived to the floor, after receiving multiple psych and narcotic meds in the ED, he has been quite sleepy. ENT:  No nose bleeds Pulm:  Denies shortness of breath or  cough. CV:  No palpitations, no LE  edema.  No chest pain GU:  No hematuria, no frequency GI:  Per HPI Heme:  No excessive bleeding or bruising.   Transfusions:  No previous transfusions Neuro:  No headaches, no peripheral tingling or numbness Derm:  No itching, no rash or sores.  Endocrine:  No sweats or chills.  No polyuria or dysuria Immunization:  Did not inquire, patient not aware of his immunizations. Travel:  None beyond local counties in last few months.    PHYSICAL EXAM: Vital signs in last 24 hours: Filed Vitals:   09/10/14 0911  BP: 100/75  Pulse: 69  Temp: 97.6 F (36.4 C)  Resp: 18   Wt Readings from Last 3 Encounters:  09/10/14 138 lb 14.2 oz (63 kg)  09/06/14 142 lb (64.411 kg)  08/03/14 135 lb 2.3 oz (61.3 kg)    General:  Although he hasn't technically lost weight, Mr. Elkhatib looks thinner than when I have seen him in past months. He is jaundiced.  He is comfortable. Head:  No swelling, no asymmetry, no signs of head trauma.  Eyes:  Scleral icterus. Conjunctiva pink. Ears:  Not hard of hearing.  Nose:  No congestion or discharge Mouth:  Clear, moist. Also teeth with lots of caries and broken off teeth, most of his teeth are gone. Mucosa is moist and clear. Neck:  No JVD, no TMG, no masses. Lungs:  No labored breathing or cough. Breath sounds are clear with somewhat decreased sounds due to poor inspiratory effort. Heart:  RRR. No MRG. S1/S2 audible. Abdomen:  Soft, bowel sounds hypoactive.  ND.  NT.  Do not appreciate any masses..   Rectal:  Deferred   Musc/Skeltl:  No joint contractures, swelling or redness. Extremities:  No CCE.  Neurologic:  Oriented to self and year. Quite somnolent and falls back to sleep easily. Moves all fours. No tremor. Limb strength not tested. Skin:  Jaundiced Tattoos:  In deltoid region of right arm. Nodes:  No inguinal or cervical adenopathy.   Psych:  Somnolent, bland affect consistent with "negative" schizophrenic  symptoms.  Intake/Output from previous day: 09/05 0701 - 09/06 0700 In: -  Out: 1425 [Urine:1425] Intake/Output this shift: Total I/O In: 0  Out: 2400 [Urine:2400]  LAB RESULTS:  Recent Labs  09/09/14 2153 09/10/14 0518  WBC 8.9 7.2  HGB 10.6* 10.1*  HCT 30.8* 29.9*  PLT 370 335   BMET Lab Results  Component Value Date   NA 132* 09/10/2014   NA 129* 09/09/2014   NA 132* 09/06/2014   K 4.5 09/10/2014   K 4.6 09/09/2014   K 4.9 09/06/2014   CL 100* 09/10/2014   CL 93* 09/09/2014   CL 93* 09/06/2014   CO2 24 09/10/2014   CO2 27 09/09/2014   CO2 29 09/06/2014   GLUCOSE 77 09/10/2014   GLUCOSE 109* 09/09/2014   GLUCOSE 90 09/06/2014   BUN 18 09/10/2014   BUN 19 09/09/2014   BUN 16 09/06/2014   CREATININE 0.76 09/10/2014   CREATININE 0.93 09/09/2014   CREATININE 0.93 09/06/2014   CALCIUM 8.8* 09/10/2014   CALCIUM 9.0 09/09/2014   CALCIUM 9.2 09/06/2014   LFT  Recent Labs  09/09/14 2153 09/10/14 0518  PROT 6.6 5.9*  ALBUMIN 3.0* 2.7*  AST 66* 67*  ALT 75* 71*  ALKPHOS 859* 819*  BILITOT 7.7* 7.1*   PT/INR Lab Results  Component Value Date   INR 1.08 09/10/2014   INR 0.95 09/06/2014   INR 1.13 06/18/2014   Hepatitis Panel  No results for input(s): HEPBSAG, HCVAB, HEPAIGM, HEPBIGM in the last 72 hours. C-Diff No components found for: CDIFF Lipase     Component Value Date/Time   LIPASE 62* 09/09/2014 2153    RADIOLOGY STUDIES: Ct Abdomen Pelvis W Wo Contrast  09/10/2014   CLINICAL DATA:  Progressive biliary dilatation, with hypodense pancreatic lesion concerning for possible malignancy on prior CT.  EXAM: CT ABDOMEN AND PELVIS WITHOUT AND WITH CONTRAST  TECHNIQUE: Multidetector CT imaging of the abdomen and pelvis was performed following the standard protocol before and following the bolus administration of intravenous contrast.  CONTRAST:  149m OMNIPAQUE IOHEXOL 350 MG/ML SOLN  COMPARISON:  08/31/2014; 07/24/2014  FINDINGS: Lower chest:  Posterior basal segment subsegmental atelectasis bilaterally.  Hepatobiliary: Considerable intrahepatic and extrahepatic biliary dilatation, new compared to 01/12/14, extrahepatic bile duct measuring up to 2 cm diameter. Fairly abrupt tapering of the common bile duct in the vicinity of the pancreatic head where there is some faint enhancement along the distal visualize margin of the CBD. This tapering seems to be about a cm proximal to the expected location of the ampulla.  Mild wall thickening of the gallbladder, with mildly complex fluid within the gallbladder.  Pancreas: Dilated and somewhat beaded dorsal pancreatic duct. This terminates in the vicinity of the pancreatic head where there appears to be a 2.6 by 2.4 cm hypoenhancing mass on the arterial phase images. When I compared back to 01/12/2014, this mass appears to be new.  Spleen: Unremarkable  Adrenals/Urinary Tract: Peripheral scarring of the right kidney lower pole.  Stomach/Bowel: Prominent stool throughout the colon favors constipation. Scattered mildly dilated loops of upper abdominal small bowel. Appendix normal.  Vascular/Lymphatic: Upper peripancreatic lymph node 1.2 cm in short axis, image 48 series 2. 1.1 cm right porta hepatis lymph node, image 45 series 2. Additional smaller porta hepatis lymph nodes are present.  Variant anatomy of the hepatic arteries, with the lateral segment left hepatic artery arising from a trifurcation of the celiac trunk, and the right hepatic artery and medial segment left hepatic artery arising from the proper hepatic artery.  With regard to vascular involvement by the tumor, there appears to be an intact fat plane between the SMA and the pancreatic head hypodense mass. The gastroduodenal artery is near the margin of the mass with some indistinct fat planes but is not definitely surrounded by tumor. The tumor does appear to flatten the upper portion of the SMV on images 51-55 of series 2, without definite invasion of  the vein.  Small retroperitoneal lymph nodes are not pathologically enlarged by size criteria.  Aortoiliac atherosclerotic vascular disease.  Reproductive: The mildly heterogeneous central zone of the prostate gland likely from mild benign prostatic hypertrophy.  Other: No supplemental non-categorized findings.  Musculoskeletal: Unremarkable  IMPRESSION: 1. 2.6 by 2.4 cm hypoenhancing mass in the pancreatic head associated with obstruction of the distal common bile duct and of the dorsal pancreatic duct. Appearance highly concerning for pancreatic adenocarcinoma. Tissue diagnosis recommended. 2. The mass minimally abuts the proximal SMV, without obvious invasion. Part of the mass is adjacent to the gastroduodenal artery. 3. Variant vascular anatomy of the liver in which the arterial supply to the lateral segment left hepatic lobe comes directly off the celiac trunk at a distal trifurcation. 4. Small but possibly malignant peripancreatic lymph nodes. 5. Gallbladder wall thickening, with suspected sludge in the gallbladder causing a higher than fluid density appearance. 6. No focal hepatic metastatic lesion is are identified.   Electronically Signed  By: Van Clines M.D.   On: 09/10/2014 12:01    ENDOSCOPIC STUDIES: No previous endoscopic evaluations.  IMPRESSION:   *  History of recurrent alcoholic hepatitis with dilated CBD and pancreatic duct on MRCP.  Now with area of low density in the pancreatic head, unable to rule out pancreatic head mass.   PLAN:     *  Per Dr. Ardis Hughs.  question timing of ERCP/EUS?  On Plavix (last dose this AM) so setting up for Friday. I stopped the Plavix.    *  Patient is hungry, I am inclined to allow po, ordered heart diet.   *  Wonder if we should stop the Mobic? Left in place for now.   *  Ca 19-9 in AM.    Azucena Freed  09/10/2014, 12:11 PM Pager: [phone]  ________________________________________________________________________  Velora Heckler GI MD  note:  I personally examined the patient, reviewed the data and agree with the assessment and plan described above.  He has obstructive jaundice from what appears to be a mass in the head of pancreas. He needs biliary decompression (ERCP) and tissue diagnosis of the mass (EUS FNA +/- ERCP brushing cytology).  I recommend we proceed with these, but we need to allow some plavix washout. Currently we have him scheduled for this Friday.   He has a long history of signing out AMA.  We need to establish his ability to make his own decisions, sign informed consent etc.  If he can sign his own consent, I assume he is fit to sign out AMA (which he has done many times).  However an office note from Dr. Oneida Alar last week stated that he "does not have the capacity to refuse appropriate medical care" which leads me to think he cannot sign for informed consent.  That may not be correct but obviously his ability to make his own decisions needs to be clarified, I will leave that to primary team.   Owens Loffler, MD [facility] Pager [phone]

## 2014-09-10 NOTE — Progress Notes (Addendum)
Patient Demographics  Chase Blackwell, is a 49 y.o. male, DOB - October 12, 1965, BBC:[id number]  Admit date - 09/09/2014   Admitting Physician Allyne Gee, MD  Outpatient Primary MD for the patient is [name], MD  LOS - 0   Chief Complaint  Patient presents with  . 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 Complaints of abdominal pain.  Assessment & Plan    Principal Problem:   Abdominal pain Active Problems:   HYPERCHOLESTEROLEMIA   Alcohol abuse   Essential hypertension   Seizures   CHF (congestive heart failure)   Hyponatremia  Elevated LFTs, abdominal pain, with CBD and pancreatic duct dilation secondary to obstructive pancreatic mass. - GI consult greatly appreciated, plan is for ERCP/EUS this coming Friday as patient is on Plavix ( currently stopped) - Monitor LFTs closely - when necessary pain medication - Follow on CA 19-9  Hyperlipidemia - Continue to hold statin  Chronic alcoholic pancreatitis - Continue with pancrelipase  Elevated LFT -related to alcoholic liver disease, mainly significant elevation of Alk Phos which is indicative of billiary obstruction. - Was elevated ammonia level, will start on lactulose  Hypertension - Blood pressure on the lower side, hold lisinopril and metoprolol  Seizures - Continue with Depakote and Keppra  CHF - Appears to be euvolemic, will hold Lasix  Schizophrenia - Continue with Seroquel, continue with Tylenol as  needed - Patient does not have ability to make decisions for herself as previous evaluation by psychiatric  Coronary artery disease - on  aspirin, will hold Plavix given plan for EUS/ERCP - Denies any chest pain or shortness of breath   Code Status: Full  Family Communication: None at bedside  Disposition Plan: Pending further workup   Procedures  None   Consults   GI   Medications  Scheduled Meds: . aspirin  81 mg Oral Daily  . benztropine  0.5 mg Oral BID  . divalproex  500 mg Oral BID  . feeding supplement (ENSURE ENLIVE)  237 mL Oral BID BM  . folic acid  1 mg Oral Daily  . furosemide  40 mg Oral Daily  . hydrALAZINE  10 mg Oral BID  . [START ON 09/11/2014] Influenza vac split quadrivalent PF  0.5 mL Intramuscular Tomorrow-1000  . lactulose  20 g Oral BID  . levETIRAcetam  1,000 mg Oral BID  . lipase/protease/amylase  12,000 Units Oral TID AC  . lisinopril  2.5 mg Oral Daily  . magnesium citrate  1 Bottle Oral Once  . metoprolol tartrate  25 mg Oral Daily  . multivitamin with minerals  1 tablet Oral Daily  . polyethylene glycol  17 g Oral Daily  . QUEtiapine  50 mg Oral QHS  . sodium chloride  3 mL Intravenous Q12H  . thiamine  100 mg Oral Daily  . traZODone  100 mg Oral QHS  .  vitamin B-12  500 mcg Oral Daily   Continuous Infusions: . sodium chloride 1,000 mL (09/09/14 2213)  . sodium chloride 50 mL/hr at 09/10/14 0126   PRN Meds:.LORazepam, ondansetron **OR** ondansetron (ZOFRAN) IV, oxyCODONE  DVT Prophylaxis  Heparin - SCDs   Lab Results  Component Value Date   PLT 335 09/10/2014    Antibiotics   Anti-infectives    None          Objective:   Filed Vitals:   09/10/14 0530 09/10/14 0558 09/10/14 0709 09/10/14 0911  BP: _0 100/75  Pulse: 48 45 54 69  Temp: 97.8 F (36.6 C)   97.6 F (36.4 C)  TempSrc:    Oral  Resp: 17   18  Height:      Weight:      SpO2: 100%   100%    Wt Readings from Last 3 Encounters:    09/10/14 63 kg (138 lb 14.2 oz)  09/06/14 64.411 kg (142 lb)  08/03/14 61.3 kg (135 lb 2.3 oz)     Intake/Output Summary (Last 24 hours) at 09/10/14 1310 Last data filed at 09/10/14 1152  Gross per 24 hour  Intake      0 ml  Output   3825 ml  Net  -3825 ml     Physical Exam  Awake Alert, jaundiced [place].AT,PERRAL Supple Neck,No JVD,  Symmetrical Chest wall movement, Good air movement bilaterally,  RRR,No Gallops,Rubs or new Murmurs, No Parasternal Heave +ve B.Sounds, Abd Soft, No tenderness, no ascites appreciated No Cyanosis, Clubbing or edema, No new Rash or bruise    Data Review   Micro Results No results found for this or any previous visit (from the past 240 hour(s)).  Radiology Reports Ct Abdomen Pelvis W Wo Contrast  09/10/2014   CLINICAL DATA:  Progressive biliary dilatation, with hypodense pancreatic lesion concerning for possible malignancy on prior CT.  EXAM: CT ABDOMEN AND PELVIS WITHOUT AND WITH CONTRAST  TECHNIQUE: Multidetector CT imaging of the abdomen and pelvis was performed following the standard protocol before and following the bolus administration of intravenous contrast.  CONTRAST:  166m OMNIPAQUE IOHEXOL 350 MG/ML SOLN  COMPARISON:  08/31/2014; 07/24/2014  FINDINGS: Lower chest: Posterior basal segment subsegmental atelectasis bilaterally.  Hepatobiliary: Considerable intrahepatic and extrahepatic biliary dilatation, new compared to 01/12/14, extrahepatic bile duct measuring up to 2 cm diameter. Fairly abrupt tapering of the common bile duct in the vicinity of the pancreatic head where there is some faint enhancement along the distal visualize margin of the CBD. This tapering seems to be about a cm proximal to the expected location of the ampulla.  Mild wall thickening of the gallbladder, with mildly complex fluid within the gallbladder.  Pancreas: Dilated and somewhat beaded dorsal pancreatic duct. This terminates in the vicinity of the pancreatic head where  there appears to be a 2.6 by 2.4 cm hypoenhancing mass on the arterial phase images. When I compared back to 01/12/2014, this mass appears to be new.  Spleen: Unremarkable  Adrenals/Urinary Tract: Peripheral scarring of the right kidney lower pole.  Stomach/Bowel: Prominent stool throughout the colon favors constipation. Scattered mildly dilated loops of upper abdominal small bowel. Appendix normal.  Vascular/Lymphatic: Upper peripancreatic lymph node 1.2 cm in short axis, image 48 series 2. 1.1 cm right porta hepatis lymph node, image 45 series 2. Additional smaller porta hepatis lymph nodes are present.  Variant anatomy of the hepatic arteries, with the lateral segment left hepatic artery arising from a trifurcation of  the celiac trunk, and the right hepatic artery and medial segment left hepatic artery arising from the proper hepatic artery.  With regard to vascular involvement by the tumor, there appears to be an intact fat plane between the SMA and the pancreatic head hypodense mass. The gastroduodenal artery is near the margin of the mass with some indistinct fat planes but is not definitely surrounded by tumor. The tumor does appear to flatten the upper portion of the SMV on images 51-55 of series 2, without definite invasion of the vein.  Small retroperitoneal lymph nodes are not pathologically enlarged by size criteria.  Aortoiliac atherosclerotic vascular disease.  Reproductive: The mildly heterogeneous central zone of the prostate gland likely from mild benign prostatic hypertrophy.  Other: No supplemental non-categorized findings.  Musculoskeletal: Unremarkable  IMPRESSION: 1. 2.6 by 2.4 cm hypoenhancing mass in the pancreatic head associated with obstruction of the distal common bile duct and of the dorsal pancreatic duct. Appearance highly concerning for pancreatic adenocarcinoma. Tissue diagnosis recommended. 2. The mass minimally abuts the proximal SMV, without obvious invasion. Part of the mass is  adjacent to the gastroduodenal artery. 3. Variant vascular anatomy of the liver in which the arterial supply to the lateral segment left hepatic lobe comes directly off the celiac trunk at a distal trifurcation. 4. Small but possibly malignant peripancreatic lymph nodes. 5. Gallbladder wall thickening, with suspected sludge in the gallbladder causing a higher than fluid density appearance. 6. No focal hepatic metastatic lesion is are identified.   Electronically Signed   By: Van Clines M.D.   On: 09/10/2014 12:01   Ct Abdomen Pelvis W Contrast  08/31/2014   CLINICAL DATA:  Upper abdominal pain. Increase bilirubin. Recent hospitalization for pancreatitis.  EXAM: CT ABDOMEN AND PELVIS WITH CONTRAST  TECHNIQUE: Multidetector CT imaging of the abdomen and pelvis was performed using the standard protocol following bolus administration of intravenous contrast.  CONTRAST:  152m OMNIPAQUE IOHEXOL 300 MG/ML  SOLN  COMPARISON:  08/03/2014.  MRI 07/24/2014  FINDINGS: Lung bases are clear. No effusions. Heart is normal size. Coronary artery calcifications noted in the visualized right coronary artery.  There is worsening biliary ductal dilatation, both intrahepatic and extrahepatic. Gallbladder is distended, similar to prior study. Pancreatic duct is dilated. Area of low density within the pancreatic head. This could reflect edema related pancreatitis, but it is difficult to completely exclude a pancreatic head lesion given the degree of biliary and pancreatic ductal dilatation.  Large stool burden throughout the colon. Views gaseous distention of bowel may reflect mild ileus. Aorta and iliac vessels are calcified, non aneurysmal.  Spleen, adrenals and kidneys are unremarkable. No free fluid, free air or adenopathy.  No acute bony abnormality or focal bone lesion.  IMPRESSION: Increasing biliary ductal dilatation within the liver and in the common bile duct. Continued pancreatic ductal dilatation. There is an area  of ill-defined low-density in the pancreatic head which could reflect edema related to acute pancreatitis, but I cannot exclude pancreatic head lesion. After acute symptoms resolve, MRI of the abdomen would be beneficial to further evaluate. Followup MRI was also recommended on prior MRI of the abdomen.  Large stool burden throughout the colon. Mild diffuse gaseous distention of bowel may reflect ileus.  Right coronary artery calcifications.   Electronically Signed   By: KRolm BaptiseM.D.   On: 08/31/2014 21:56     CBC  Recent Labs Lab 09/06/14 1043 09/09/14 2153 09/10/14 0518  WBC 9.9 8.9 7.2  HGB 11.5* 10.6*  10.1*  HCT 32.4* 30.8* 29.9*  PLT 450* 370 335  MCV 88.3 90.1 90.3  MCH 31.3 31.0 30.5  MCHC 35.5 34.4 33.8  RDW 14.9 15.7* 15.7*  LYMPHSABS 1.9 1.9  --   MONOABS 0.8 0.6  --   EOSABS 0.9* 0.8*  --   BASOSABS 0.1 0.0  --     Chemistries   Recent Labs Lab 09/06/14 1043 09/09/14 2153 09/10/14 0518  NA 132* 129* 132*  K 4.9 4.6 4.5  CL 93* 93* 100*  CO2 _0 GLUCOSE 90 109* 77  BUN _1 CREATININE 0.93 0.93 0.76  CALCIUM 9.2 9.0 8.8*  AST 69* 66* 67*  ALT 84* 75* 71*  ALKPHOS 870* 859* 819*  BILITOT 7.9* 7.7* 7.1*   ------------------------------------------------------------------------------------------------------------------ estimated creatinine clearance is 100.6 mL/min (by C-G formula based on Cr of 0.76). ------------------------------------------------------------------------------------------------------------------ No results for input(s): HGBA1C in the last 72 hours. ------------------------------------------------------------------------------------------------------------------ No results for input(s): CHOL, HDL, LDLCALC, TRIG, CHOLHDL, LDLDIRECT in the last 72 hours. ------------------------------------------------------------------------------------------------------------------  Recent Labs  09/10/14 0518  TSH 5.329*    ------------------------------------------------------------------------------------------------------------------ No results for input(s): VITAMINB12, FOLATE, FERRITIN, TIBC, IRON, RETICCTPCT in the last 72 hours.  Coagulation profile  Recent Labs Lab 09/06/14 1043 09/10/14 0518  INR 0.95 1.08    No results for input(s): DDIMER in the last 72 hours.  Cardiac Enzymes No results for input(s): CKMB, TROPONINI, MYOGLOBIN in the last 168 hours.  Invalid input(s): CK ------------------------------------------------------------------------------------------------------------------ Invalid input(s): POCBNP     Time Spent in minutes   30 minutes  Zunaira Lamy M.D on 09/10/2014 at 1:10 PM  Between 7am to 7pm - Pager - [phone]  After 7pm go to [email or web address] - password [facility]  [facility]   Office  [phone]

## 2014-09-10 NOTE — Progress Notes (Signed)
Patient stating that he is leaving. Spoke with Dr. Darlyn Chamber who indicated that patient is not competent to make medical decisions. Patient states " You can't keep me here". Dr. Darlyn Chamber informed RN to notify SW for IVC. SW Crawford Givens made aware. Blanchie Zeleznik, Bryn Gulling

## 2014-09-10 NOTE — Progress Notes (Signed)
cc'ed to pcp °

## 2014-09-10 NOTE — H&P (Signed)
[facility] History and Physical  Chase Blackwell GYF:[id number] DOB: Dec 13, 1965 DOA: 09/09/2014  Referring physician: Delora Fuel, MD PCP: [name], MD   Chief Complaint: Abdominal Pain  HPI: Chase Blackwell is a 49 y.o. male with prior history of hypercholesterolemia pancreatitis HTN GERD schizophrenia ETOH abuse Seizure disorder presents with abdominal pain. Patient states the pain has been going on for about 2 weeks. He states he is a resident of a SNF. Patient apparently has been seen by GI on 9/2 and had been complaining of 10/10 abdominal pain at that time. He has also been having vomiting in the past week but states currently he is not vomiting. Patient states that he is currently not drinking. Patient was supposed to have possible EUS/ERCP however has not had this scheduled that he is aware of. He now presents to the ED with pain again. He has had a increase in his lipase and a CT scan done 8/27 which shows increased dilatation of the common ducts. According to office notes instructions were given to come to the ED if he had fevers or worsening abdominal pain.    Review of Systems:  12 point ROS attempted patient is not able to provide as he is a not a reliable historian   Past Medical History  Diagnosis Date  . Hypertension   . Asthma   . GERD (gastroesophageal reflux disease)   . Chronic pain   . Schizophrenia   . Alcohol abuse   . Seizures 07/2013    seizure like activity following cardiac/resp arrest.   . Pancreatitis 09/1636    alcoholic, recurrent  . Depression   . CAD (coronary artery disease)   . Cardiac arrest 07/2013    with acute MI and secondary respiratory arrest  . Protein calorie malnutrition 07/2013   Past Surgical History  Procedure Laterality Date  . Left heart catheterization with coronary angiogram N/A 07/06/2013    Procedure: LEFT HEART CATHETERIZATION WITH CORONARY ANGIOGRAM;  Surgeon: Clent Demark, MD;  Location: [facility] CATH LAB;  Service:  Cardiovascular;  Laterality: N/A;  . Percutaneous coronary stent intervention (pci-s)  07/06/2013    Procedure: PERCUTANEOUS CORONARY STENT INTERVENTION (PCI-S);  Surgeon: Clent Demark, MD;  Location: [facility] CATH LAB;  Service: Cardiovascular;;  . Tracheostomy  07/2013   Social History:  reports that he has been smoking Cigarettes.  He has been smoking about 2.00 packs per day. He does not have any smokeless tobacco history on file. He reports that he drinks alcohol. He reports that he uses illicit drugs (Marijuana).  Allergies  Allergen Reactions  . Hctz [Hydrochlorothiazide] Other (See Comments)    Dizzy spells  . Hydroxyzine Hives  . Sulfonamide Derivatives Hives  . Cetirizine & Related Other (See Comments)    Brothers were not aware of this allergy    Family History  Problem Relation Age of Onset  . Malignant hyperthermia Mother   . Cirrhosis Father   . Alcohol abuse Father      Prior to Admission medications   Medication Sig Start Date End Date Taking? Authorizing Provider  aspirin 81 MG chewable tablet Chew 1 tablet (81 mg total) by mouth daily. 11/23/13   Kristen N Grime, DO  atorvastatin (LIPITOR) 80 MG tablet Take 1 tablet (80 mg total) by mouth daily. Patient taking differently: Take 80 mg by mouth at bedtime.  02/13/14   Nicole Pisciotta, PA-C  benztropine (COGENTIN) 0.5 MG tablet Take 1 tablet (0.5 mg total) by mouth 2 (two)  times daily. 08/09/14   Nita Sells, MD  clopidogrel (PLAVIX) 75 MG tablet Take 75 mg by mouth daily. 06/11/14   Historical Provider, MD  divalproex (DEPAKOTE) 500 MG DR tablet Take 1 tablet (500 mg total) by mouth 2 (two) times daily. 08/09/14   Nita Sells, MD  feeding supplement, ENSURE ENLIVE, (ENSURE ENLIVE) LIQD Take 237 mLs by mouth 2 (two) times daily between meals. 06/11/14   Eugenie Filler, MD  folic acid (FOLVITE) 1 MG tablet Take 1 tablet (1 mg total) by mouth daily. 06/11/14   Eugenie Filler, MD  furosemide (LASIX) 40 MG tablet  Take 40 mg by mouth daily. 07/18/14   Historical Provider, MD  haloperidol decanoate (HALDOL DECANOATE) 100 MG/ML injection Inject 1 mL (100 mg total) into the muscle every 28 (twenty-eight) days. 08/09/14   Nita Sells, MD  hydrALAZINE (APRESOLINE) 10 MG tablet Take 1 tablet (10 mg total) by mouth 2 (two) times daily. 02/13/14   Nicole Pisciotta, PA-C  levETIRAcetam (KEPPRA) 1000 MG tablet Take 1 tablet (1,000 mg total) by mouth 2 (two) times daily. 08/09/14   Nita Sells, MD  lisinopril (PRINIVIL,ZESTRIL) 2.5 MG tablet Take 2.5 mg by mouth daily.    Historical Provider, MD  LORazepam (ATIVAN) 0.5 MG tablet TAKE 1 TABLET BY MOUTH EVERY EVENING AS NEEDED 08/09/14   Historical Provider, MD  meloxicam (MOBIC) 15 MG tablet Take 15 mg by mouth daily.    Historical Provider, MD  metoprolol tartrate (LOPRESSOR) 25 MG tablet Take 0.5 tablets (12.5 mg total) by mouth 2 (two) times daily. Patient taking differently: Take 25 mg by mouth daily.  02/13/14   Elmyra Ricks Pisciotta, PA-C  oxyCODONE (OXY IR/ROXICODONE) 5 MG immediate release tablet one every four hours prn 08/20/14   Historical Provider, MD  Pancrelipase, Lip-Prot-Amyl, 24000 UNITS CPEP Two PO with meals (TID) and one PO with snacks (BID). Total of 8 daily. 09/06/14   Mahala Menghini, PA-C  polyethylene glycol (MIRALAX / GLYCOLAX) packet Take 17 g by mouth daily.    Historical Provider, MD  QUEtiapine (SEROQUEL) 50 MG tablet Take 1 tablet (50 mg total) by mouth at bedtime. 08/09/14   Nita Sells, MD  thiamine 100 MG tablet Take 1 tablet (100 mg total) by mouth daily. 06/11/14   Eugenie Filler, MD  traZODone (DESYREL) 100 MG tablet Take 1 tablet (100 mg total) by mouth at bedtime. 08/09/14   Nita Sells, MD  vitamin B-12 500 MCG tablet Take 1 tablet (500 mcg total) by mouth daily. 07/02/14   Orson Eva, MD   Physical Exam: Filed Vitals:   09/09/14 2136  BP: 110/64  Pulse: 73  Temp: 98.5 F (36.9 C)  TempSrc: Oral  Resp: 18  Height:  6' (1.829 m)  Weight: 64.411 kg (142 lb)  SpO2: 99%    Wt Readings from Last 3 Encounters:  09/09/14 64.411 kg (142 lb)  09/06/14 64.411 kg (142 lb)  08/03/14 61.3 kg (135 lb 2.3 oz)    General:  Appears calm and comfortable Eyes: PERRL, normal lids, irises & conjunctiva ENT: grossly normal hearing, lips & tongue Neck: no LAD, masses or thyromegaly Cardiovascular: RRR, no m/r/g. No LE edema Respiratory: CTA bilaterally, no w/r/r. Normal respiratory effort. Abdomen: soft, ntnd Skin: +jaundice Musculoskeletal: grossly normal tone BUE/BLE Psychiatric: grossly normal mood and affect Neurologic: grossly non-focal.          Labs on Admission:  Basic Metabolic Panel:  Recent Labs Lab 09/06/14 1043 09/09/14 2153  NA  132* 129*  K 4.9 4.6  CL 93* 93*  CO2 29 27  GLUCOSE 90 109*  BUN 16 19  CREATININE 0.93 0.93  CALCIUM 9.2 9.0   Liver Function Tests:  Recent Labs Lab 09/06/14 1043 09/09/14 2153  AST 69* 66*  ALT 84* 75*  ALKPHOS 870* 859*  BILITOT 7.9* 7.7*  PROT 6.9 6.6  ALBUMIN 3.9 3.0*    Recent Labs Lab 09/06/14 1043 09/09/14 2153  LIPASE 18 62*   No results for input(s): AMMONIA in the last 168 hours. CBC:  Recent Labs Lab 09/06/14 1043 09/09/14 2153  WBC 9.9 8.9  NEUTROABS 6.2 5.5  HGB 11.5* 10.6*  HCT 32.4* 30.8*  MCV 88.3 90.1  PLT 450* 370   Cardiac Enzymes: No results for input(s): CKTOTAL, CKMB, CKMBINDEX, TROPONINI in the last 168 hours.  BNP (last 3 results)  Recent Labs  05/01/14 2153 06/06/14 2206  BNP 21.0 28.5    ProBNP (last 3 results) No results for input(s): PROBNP in the last 8760 hours.  CBG: No results for input(s): GLUCAP in the last 168 hours.  Radiological Exams on Admission: No results found.   Assessment/Plan Principal Problem:   Abdominal pain Active Problems:   HYPERCHOLESTEROLEMIA   Alcohol abuse   Essential hypertension   Seizures   CHF (congestive heart failure)    Hyponatremia   1. Abdominal Pain/Possible Pancreatitis or Mass -patient has had multiple admissions for abdominal pain now he has increasing lipase and also has increased dilatation of the biliary ducts with an area of low density in the pancreatic head based on a CT scan from 8/27 raising a concern for pancreatic malignancy. Patient was to have a work up as an outpatient but his pain has increased and is being admitted for pain control and further GI workup. -will admit for pain control -will start on IVF for hydration and hyponatremia -will need GI consult called in am -repeat CT scan of the abdomen  2. Hypercholesterolemia -will hold statins at this time  3. Essential hypertension -monitor pressures -will continue with lopressor ACE inhibitors  4. Seizures -will continue with depakote -will continue with keppra  5. CHF -will continue with lasix ACE inhibitors -EF 55-60% on 06/10/14  6. Hyponatremia -will start IVF NS -will follow up labs  7. Schizophrenia -will continue with haldol as needed -continue with seroquel -patient does not have ability to make decisions for self  8. CAD -on plavix ASA -currently no complaints of chest pain   Code Status: full code (must indicate code status--if unknown or must be presumed, indicate so) DVT Prophylaxis:SCD Family Communication: none (indicate person spoken with, if applicable, with phone number if by telephone) Disposition Plan: SNF (indicate anticipated LOS)    [facility] Pager [phone]

## 2014-09-10 NOTE — Progress Notes (Signed)
Utilization review completed. Sherrilynn Gudgel, RN, BSN. 

## 2014-09-10 NOTE — Progress Notes (Signed)
Pt opens eyes to touch. Oriented x 4 with delayed responses and quickly falls back to sleep. BP is 74/40 P 48 R 17 O2 saturation 100% via RA. Rapid response notified. Lamar Blinks NP notified. Orders received.

## 2014-09-11 DIAGNOSIS — F1010 Alcohol abuse, uncomplicated: Secondary | ICD-10-CM

## 2014-09-11 DIAGNOSIS — K860 Alcohol-induced chronic pancreatitis: Secondary | ICD-10-CM | POA: Diagnosis not present

## 2014-09-11 LAB — COMPREHENSIVE METABOLIC PANEL
ALK PHOS: 736 U/L — AB (ref 38–126)
ALT: 63 U/L (ref 17–63)
ANION GAP: 8 (ref 5–15)
AST: 53 U/L — AB (ref 15–41)
Albumin: 2.7 g/dL — ABNORMAL LOW (ref 3.5–5.0)
BUN: 11 mg/dL (ref 6–20)
CO2: 25 mmol/L (ref 22–32)
Calcium: 8.7 mg/dL — ABNORMAL LOW (ref 8.9–10.3)
Chloride: 100 mmol/L — ABNORMAL LOW (ref 101–111)
Creatinine, Ser: 0.67 mg/dL (ref 0.61–1.24)
GLUCOSE: 77 mg/dL (ref 65–99)
POTASSIUM: 4.1 mmol/L (ref 3.5–5.1)
SODIUM: 133 mmol/L — AB (ref 135–145)
TOTAL PROTEIN: 6.2 g/dL — AB (ref 6.5–8.1)
Total Bilirubin: 7.2 mg/dL — ABNORMAL HIGH (ref 0.3–1.2)

## 2014-09-11 LAB — GLUCOSE, CAPILLARY: GLUCOSE-CAPILLARY: 72 mg/dL (ref 65–99)

## 2014-09-11 LAB — CBC
HCT: 29.3 % — ABNORMAL LOW (ref 39.0–52.0)
HEMOGLOBIN: 9.9 g/dL — AB (ref 13.0–17.0)
MCH: 30.7 pg (ref 26.0–34.0)
MCHC: 33.8 g/dL (ref 30.0–36.0)
MCV: 91 fL (ref 78.0–100.0)
PLATELETS: 314 10*3/uL (ref 150–400)
RBC: 3.22 MIL/uL — AB (ref 4.22–5.81)
RDW: 15.7 % — ABNORMAL HIGH (ref 11.5–15.5)
WBC: 6 10*3/uL (ref 4.0–10.5)

## 2014-09-11 LAB — AMMONIA: AMMONIA: 65 umol/L — AB (ref 9–35)

## 2014-09-11 LAB — HEMOGLOBIN A1C
Hgb A1c MFr Bld: 5.1 % (ref 4.8–5.6)
Mean Plasma Glucose: 100 mg/dL

## 2014-09-11 MED ORDER — HALOPERIDOL DECANOATE 100 MG/ML IM SOLN: 100.0000 mg | INTRAMUSCULAR | Status: DC

## 2014-09-11 NOTE — Progress Notes (Signed)
[facility] Progress Note    Since last GI note: Sleepy, arousable. Asking for morphine for abd pain.  Was talking about leaving AMA this AM however he was officially involuntarily committed (IVC).  He ate small amount for BF this AM per RN.    Objective: Vital signs in last 24 hours: Temp:  [97.4 F (36.3 C)-98.1 F (36.7 C)] 97.9 F (36.6 C) (09/07 0757) Pulse Rate:  [65-82] 65 (09/07 0757) Resp:  [17-21] 21 (09/07 0757) BP: (107-141)/(65-87) 108/68 mmHg (09/07 0757) SpO2:  [99 %-100 %] 100 % (09/07 0757) Weight:  [139 lb 5.3 oz (63.2 kg)] 139 lb 5.3 oz (63.2 kg) (09/06 2024)   General: alert and oriented times 3, jaundiced Heart: regular rate and rythm Abdomen: soft, mildly tender, non-distended, normal bowel sounds   Lab Results:  Recent Labs  09/09/14 2153 09/10/14 0518 09/11/14 0548  WBC 8.9 7.2 6.0  HGB 10.6* 10.1* 9.9*  PLT 370 335 314  MCV 90.1 90.3 91.0    Recent Labs  09/09/14 2153 09/10/14 0518 09/11/14 0548  NA 129* 132* 133*  K 4.6 4.5 4.1  CL 93* 100* 100*  CO2 27 24 25   GLUCOSE 109* 77 77  BUN 19 18 11   CREATININE 0.93 0.76 0.67  CALCIUM 9.0 8.8* 8.7*    Recent Labs  09/09/14 2153 09/10/14 0518 09/11/14 0548  PROT 6.6 5.9* 6.2*  ALBUMIN 3.0* 2.7* 2.7*  AST 66* 67* 53*  ALT 75* 71* 63  ALKPHOS 859* 819* 736*  BILITOT 7.7* 7.1* 7.2*    Recent Labs  09/10/14 0518  INR 1.08     Studies/Results: Ct Abdomen Pelvis W Wo Contrast  09/10/2014   CLINICAL DATA:  Progressive biliary dilatation, with hypodense pancreatic lesion concerning for possible malignancy on prior CT.  EXAM: CT ABDOMEN AND PELVIS WITHOUT AND WITH CONTRAST  TECHNIQUE: Multidetector CT imaging of the abdomen and pelvis was performed following the standard protocol before and following the bolus administration of intravenous contrast.  CONTRAST:  163mL OMNIPAQUE IOHEXOL 350 MG/ML SOLN  COMPARISON:  08/31/2014; 07/24/2014  FINDINGS: Lower chest: Posterior  basal segment subsegmental atelectasis bilaterally.  Hepatobiliary: Considerable intrahepatic and extrahepatic biliary dilatation, new compared to 01/12/14, extrahepatic bile duct measuring up to 2 cm diameter. Fairly abrupt tapering of the common bile duct in the vicinity of the pancreatic head where there is some faint enhancement along the distal visualize margin of the CBD. This tapering seems to be about a cm proximal to the expected location of the ampulla.  Mild wall thickening of the gallbladder, with mildly complex fluid within the gallbladder.  Pancreas: Dilated and somewhat beaded dorsal pancreatic duct. This terminates in the vicinity of the pancreatic head where there appears to be a 2.6 by 2.4 cm hypoenhancing mass on the arterial phase images. When I compared back to 01/12/2014, this mass appears to be new.  Spleen: Unremarkable  Adrenals/Urinary Tract: Peripheral scarring of the right kidney lower pole.  Stomach/Bowel: Prominent stool throughout the colon favors constipation. Scattered mildly dilated loops of upper abdominal small bowel. Appendix normal.  Vascular/Lymphatic: Upper peripancreatic lymph node 1.2 cm in short axis, image 48 series 2. 1.1 cm right porta hepatis lymph node, image 45 series 2. Additional smaller porta hepatis lymph nodes are present.  Variant anatomy of the hepatic arteries, with the lateral segment left hepatic artery arising from a trifurcation of the celiac trunk, and the right hepatic artery and medial segment left hepatic artery arising from the proper hepatic  artery.  With regard to vascular involvement by the tumor, there appears to be an intact fat plane between the SMA and the pancreatic head hypodense mass. The gastroduodenal artery is near the margin of the mass with some indistinct fat planes but is not definitely surrounded by tumor. The tumor does appear to flatten the upper portion of the SMV on images 51-55 of series 2, without definite invasion of the vein.   Small retroperitoneal lymph nodes are not pathologically enlarged by size criteria.  Aortoiliac atherosclerotic vascular disease.  Reproductive: The mildly heterogeneous central zone of the prostate gland likely from mild benign prostatic hypertrophy.  Other: No supplemental non-categorized findings.  Musculoskeletal: Unremarkable  IMPRESSION: 1. 2.6 by 2.4 cm hypoenhancing mass in the pancreatic head associated with obstruction of the distal common bile duct and of the dorsal pancreatic duct. Appearance highly concerning for pancreatic adenocarcinoma. Tissue diagnosis recommended. 2. The mass minimally abuts the proximal SMV, without obvious invasion. Part of the mass is adjacent to the gastroduodenal artery. 3. Variant vascular anatomy of the liver in which the arterial supply to the lateral segment left hepatic lobe comes directly off the celiac trunk at a distal trifurcation. 4. Small but possibly malignant peripancreatic lymph nodes. 5. Gallbladder wall thickening, with suspected sludge in the gallbladder causing a higher than fluid density appearance. 6. No focal hepatic metastatic lesion is are identified.   Electronically Signed   By: Van Clines M.D.   On: 09/10/2014 12:01     Medications: Scheduled Meds: . aspirin  81 mg Oral Daily  . benztropine  0.5 mg Oral BID  . divalproex  500 mg Oral BID  . feeding supplement (ENSURE ENLIVE)  237 mL Oral BID BM  . folic acid  1 mg Oral Daily  . [START ON 09/29/2014] haloperidol decanoate  100 mg Intramuscular Q28 days  . heparin subcutaneous  5,000 Units Subcutaneous 3 times per day  . hydrALAZINE  10 mg Oral BID  . Influenza vac split quadrivalent PF  0.5 mL Intramuscular Tomorrow-1000  . lactulose  20 g Oral BID  . levETIRAcetam  1,000 mg Oral BID  . lipase/protease/amylase  12,000 Units Oral TID AC  . multivitamin with minerals  1 tablet Oral Daily  . nicotine  21 mg Transdermal Daily  . polyethylene glycol  17 g Oral Daily  . QUEtiapine   50 mg Oral QHS  . sodium chloride  3 mL Intravenous Q12H  . thiamine  100 mg Oral Daily  . traZODone  100 mg Oral QHS  . vitamin B-12  500 mcg Oral Daily   Continuous Infusions: . sodium chloride 1,000 mL (09/09/14 2213)  . sodium chloride 50 mL/hr at 09/11/14 0445   PRN Meds:.LORazepam, ondansetron **OR** ondansetron (ZOFRAN) IV, oxyCODONE    Assessment/Plan: 49 y.o. male with obstructive jaundice (pancreatic mass vs chronic pancreatitis).  Planning for EUS/ERCP on Friday to allow a bit more time for plavix washout.  We believe his brother is his closest relative, will need informed consent from him.  Will return tomorrow to make sure all is set for the procedures the following day; will need to stop heparin injections tomorrow evening.  If all goes well with stenting he will be safe for d/c on Friday likely.   Milus Banister, MD  09/11/2014, 9:40 AM [facility] Pager [phone]

## 2014-09-11 NOTE — Progress Notes (Signed)
Quick Note:  Patient inpatient. ______

## 2014-09-11 NOTE — Progress Notes (Addendum)
Patient Demographics  Chase Blackwell, is a 49 y.o. male, DOB - 10-Jun-1965, WIO:[id number]  Admit date - 09/09/2014   Admitting Physician Allyne Gee, MD  Outpatient Primary MD for the patient is [name], MD  LOS - 1   Chief Complaint  Patient presents with  . 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 -with elevated LFTs, abdominal pain, with CBD and pancreatic duct dilation  - GI consult greatly appreciated, plan is for ERCP/EUS this Friday as patient was on Plavix ( currently held) - LFTS improving - CA 19-9 pending  Hyperlipidemia -held statin  Chronic alcoholic pancreatitis - Continue with pancrealipase  Abnormal LFTS -related to alcoholic liver disease, mainly significant elevation of Alk Phos which is indicative of billiary obstruction from #1 -Elevated ammonia level, started on lactulose  Hypertension - Blood pressure on the lower side, held lisinopril and metoprolol  Seizures - Continue with Depakote and Keppra  CHF - Appears to be euvolemic, Lasix on hold  Schizophrenia - Continue with Seroquel, continue with Tylenol as needed - Patient does not have ability to make decisions for herself as previously evaluated by psychiatry -Brother Kasandra Knudsen makes decisions per patient  Coronary artery disease - on  aspirin, held Plavix, for EUS/ERCP - stable  DVT Prophylaxis  Heparin - SCDs   Code Status: Full  Family Communication: None at  bedside  Disposition Plan: Pending further workup, back to SNF   Procedures  None   Consults   GI   Medications  Scheduled Meds: . aspirin  81 mg Oral Daily  . benztropine  0.5 mg Oral BID  . divalproex  500 mg Oral BID  . feeding supplement (ENSURE ENLIVE)  237 mL Oral BID BM  . folic acid  1 mg Oral Daily  . [START ON 09/29/2014] haloperidol decanoate  100 mg Intramuscular Q28 days  . heparin subcutaneous  5,000 Units Subcutaneous 3 times per day  . hydrALAZINE  10 mg Oral BID  . lactulose  20 g Oral BID  . levETIRAcetam  1,000 mg Oral BID  . lipase/protease/amylase  12,000 Units Oral TID AC  . multivitamin with minerals  1 tablet Oral Daily  . nicotine  21 mg Transdermal Daily  . polyethylene glycol  17 g Oral Daily  . QUEtiapine  50 mg Oral QHS  . sodium chloride  3 mL Intravenous Q12H  . thiamine  100 mg Oral Daily  . traZODone  100 mg Oral QHS  . vitamin B-12  500 mcg Oral Daily   Continuous Infusions: . sodium chloride 1,000 mL (09/09/14 2213)  . sodium chloride 50 mL/hr at 09/11/14 0445   PRN Meds:.LORazepam, ondansetron **OR** ondansetron (ZOFRAN) IV, oxyCODONE  Lab Results  Component Value Date   PLT 314 09/11/2014    Antibiotics   Anti-infectives    None  Objective:   Filed Vitals:   09/10/14 1708 09/10/14 2024 09/11/14 0429 09/11/14 0757  BP: 107/65 141/87 116/72 108/68  Pulse: 72 82 82 65  Temp: 97.5 F (36.4 C) 98.1 F (36.7 C) 97.4 F (36.3 C) 97.9 F (36.6 C)  TempSrc: Oral Oral Oral Oral  Resp: _0 Height:      Weight:  63.2 kg (139 lb 5.3 oz)    SpO2: 99% 100% 100% 100%    Wt Readings from Last 3 Encounters:  09/10/14 63.2 kg (139 lb 5.3 oz)  09/06/14 64.411 kg (142 lb)  08/03/14 61.3 kg (135 lb 2.3 oz)     Intake/Output Summary (Last 24 hours) at 09/11/14 1400 Last data filed at 09/11/14 1312  Gross per 24 hour  Intake   2065 ml  Output   1850 ml  Net    215 ml     Physical Exam  Awake  Alert, jaundiced, no distress [place].AT,PERRAL Supple Neck,No JVD,  Symmetrical Chest wall movement, Good air movement bilaterally,  RRR,No Gallops,Rubs or new Murmurs, No Parasternal Heave Abd Soft, No tenderness, no ascites appreciated Ext: No Cyanosis, Clubbing or edema,   Data Review   Micro Results No results found for this or any previous visit (from the past 240 hour(s)).  Radiology Reports Ct Abdomen Pelvis W Wo Contrast  09/10/2014   CLINICAL DATA:  Progressive biliary dilatation, with hypodense pancreatic lesion concerning for possible malignancy on prior CT.  EXAM: CT ABDOMEN AND PELVIS WITHOUT AND WITH CONTRAST  TECHNIQUE: Multidetector CT imaging of the abdomen and pelvis was performed following the standard protocol before and following the bolus administration of intravenous contrast.  CONTRAST:  122m OMNIPAQUE IOHEXOL 350 MG/ML SOLN  COMPARISON:  08/31/2014; 07/24/2014  FINDINGS: Lower chest: Posterior basal segment subsegmental atelectasis bilaterally.  Hepatobiliary: Considerable intrahepatic and extrahepatic biliary dilatation, new compared to 01/12/14, extrahepatic bile duct measuring up to 2 cm diameter. Fairly abrupt tapering of the common bile duct in the vicinity of the pancreatic head where there is some faint enhancement along the distal visualize margin of the CBD. This tapering seems to be about a cm proximal to the expected location of the ampulla.  Mild wall thickening of the gallbladder, with mildly complex fluid within the gallbladder.  Pancreas: Dilated and somewhat beaded dorsal pancreatic duct. This terminates in the vicinity of the pancreatic head where there appears to be a 2.6 by 2.4 cm hypoenhancing mass on the arterial phase images. When I compared back to 01/12/2014, this mass appears to be new.  Spleen: Unremarkable  Adrenals/Urinary Tract: Peripheral scarring of the right kidney lower pole.  Stomach/Bowel: Prominent stool throughout the colon favors constipation.  Scattered mildly dilated loops of upper abdominal small bowel. Appendix normal.  Vascular/Lymphatic: Upper peripancreatic lymph node 1.2 cm in short axis, image 48 series 2. 1.1 cm right porta hepatis lymph node, image 45 series 2. Additional smaller porta hepatis lymph nodes are present.  Variant anatomy of the hepatic arteries, with the lateral segment left hepatic artery arising from a trifurcation of the celiac trunk, and the right hepatic artery and medial segment left hepatic artery arising from the proper hepatic artery.  With regard to vascular involvement by the tumor, there appears to be an intact fat plane between the SMA and the pancreatic head hypodense mass. The gastroduodenal artery is near the margin of the mass with some indistinct fat planes but is not definitely surrounded by tumor. The tumor does appear to  flatten the upper portion of the SMV on images 51-55 of series 2, without definite invasion of the vein.  Small retroperitoneal lymph nodes are not pathologically enlarged by size criteria.  Aortoiliac atherosclerotic vascular disease.  Reproductive: The mildly heterogeneous central zone of the prostate gland likely from mild benign prostatic hypertrophy.  Other: No supplemental non-categorized findings.  Musculoskeletal: Unremarkable  IMPRESSION: 1. 2.6 by 2.4 cm hypoenhancing mass in the pancreatic head associated with obstruction of the distal common bile duct and of the dorsal pancreatic duct. Appearance highly concerning for pancreatic adenocarcinoma. Tissue diagnosis recommended. 2. The mass minimally abuts the proximal SMV, without obvious invasion. Part of the mass is adjacent to the gastroduodenal artery. 3. Variant vascular anatomy of the liver in which the arterial supply to the lateral segment left hepatic lobe comes directly off the celiac trunk at a distal trifurcation. 4. Small but possibly malignant peripancreatic lymph nodes. 5. Gallbladder wall thickening, with suspected  sludge in the gallbladder causing a higher than fluid density appearance. 6. No focal hepatic metastatic lesion is are identified.   Electronically Signed   By: Van Clines M.D.   On: 09/10/2014 12:01   Ct Abdomen Pelvis W Contrast  08/31/2014   CLINICAL DATA:  Upper abdominal pain. Increase bilirubin. Recent hospitalization for pancreatitis.  EXAM: CT ABDOMEN AND PELVIS WITH CONTRAST  TECHNIQUE: Multidetector CT imaging of the abdomen and pelvis was performed using the standard protocol following bolus administration of intravenous contrast.  CONTRAST:  176mL OMNIPAQUE IOHEXOL 300 MG/ML  SOLN  COMPARISON:  08/03/2014.  MRI 07/24/2014  FINDINGS: Lung bases are clear. No effusions. Heart is normal size. Coronary artery calcifications noted in the visualized right coronary artery.  There is worsening biliary ductal dilatation, both intrahepatic and extrahepatic. Gallbladder is distended, similar to prior study. Pancreatic duct is dilated. Area of low density within the pancreatic head. This could reflect edema related pancreatitis, but it is difficult to completely exclude a pancreatic head lesion given the degree of biliary and pancreatic ductal dilatation.  Large stool burden throughout the colon. Views gaseous distention of bowel may reflect mild ileus. Aorta and iliac vessels are calcified, non aneurysmal.  Spleen, adrenals and kidneys are unremarkable. No free fluid, free air or adenopathy.  No acute bony abnormality or focal bone lesion.  IMPRESSION: Increasing biliary ductal dilatation within the liver and in the common bile duct. Continued pancreatic ductal dilatation. There is an area of ill-defined low-density in the pancreatic head which could reflect edema related to acute pancreatitis, but I cannot exclude pancreatic head lesion. After acute symptoms resolve, MRI of the abdomen would be beneficial to further evaluate. Followup MRI was also recommended on prior MRI of the abdomen.  Large stool  burden throughout the colon. Mild diffuse gaseous distention of bowel may reflect ileus.  Right coronary artery calcifications.   Electronically Signed   By: Rolm Baptise M.D.   On: 08/31/2014 21:56     CBC  Recent Labs Lab 09/06/14 1043 09/09/14 2153 09/10/14 0518 09/11/14 0548  WBC 9.9 8.9 7.2 6.0  HGB 11.5* 10.6* 10.1* 9.9*  HCT 32.4* 30.8* 29.9* 29.3*  PLT 450* 370 335 314  MCV 88.3 90.1 90.3 91.0  MCH 31.3 31.0 30.5 30.7  MCHC 35.5 34.4 33.8 33.8  RDW 14.9 15.7* 15.7* 15.7*  LYMPHSABS 1.9 1.9  --   --   MONOABS 0.8 0.6  --   --   EOSABS 0.9* 0.8*  --   --   BASOSABS  0.1 0.0  --   --     Chemistries   Recent Labs Lab 09/06/14 1043 09/09/14 2153 09/10/14 0518 09/11/14 0548  NA 132* 129* 132* 133*  K 4.9 4.6 4.5 4.1  CL 93* 93* 100* 100*  CO2 _0 GLUCOSE 90 109* 77 77  BUN _1 CREATININE 0.93 0.93 0.76 0.67  CALCIUM 9.2 9.0 8.8* 8.7*  AST 69* 66* 67* 53*  ALT 84* 75* 71* 63  ALKPHOS 870* 859* 819* 736*  BILITOT 7.9* 7.7* 7.1* 7.2*   ------------------------------------------------------------------------------------------------------------------ estimated creatinine clearance is 100.9 mL/min (by C-G formula based on Cr of 0.67). ------------------------------------------------------------------------------------------------------------------  Recent Labs  09/10/14 0518  HGBA1C 5.1   ------------------------------------------------------------------------------------------------------------------ No results for input(s): CHOL, HDL, LDLCALC, TRIG, CHOLHDL, LDLDIRECT in the last 72 hours. ------------------------------------------------------------------------------------------------------------------  Recent Labs  09/10/14 0518  TSH 5.329*   ------------------------------------------------------------------------------------------------------------------ No results for input(s): VITAMINB12, FOLATE, FERRITIN, TIBC, IRON, RETICCTPCT in  the last 72 hours.  Coagulation profile  Recent Labs Lab 09/06/14 1043 09/10/14 0518  INR 0.95 1.08    No results for input(s): DDIMER in the last 72 hours.  Cardiac Enzymes No results for input(s): CKMB, TROPONINI, MYOGLOBIN in the last 168 hours.  Invalid input(s): CK ------------------------------------------------------------------------------------------------------------------ Invalid input(s): POCBNP     Time Spent in minutes   78 minutes  Yashua Bracco M.D on 09/11/2014 at 2:00 PM  Between 7am to 7pm - Pager - [phone]  After 7pm go to [email or web address] - password [email or web address]  [facility]   Office  [phone]

## 2014-09-12 DIAGNOSIS — K861 Other chronic pancreatitis: Secondary | ICD-10-CM

## 2014-09-12 DIAGNOSIS — K859 Acute pancreatitis without necrosis or infection, unspecified: Secondary | ICD-10-CM | POA: Insufficient documentation

## 2014-09-12 DIAGNOSIS — R1013 Epigastric pain: Secondary | ICD-10-CM

## 2014-09-12 DIAGNOSIS — R109 Unspecified abdominal pain: Secondary | ICD-10-CM

## 2014-09-12 LAB — COMPREHENSIVE METABOLIC PANEL
ALBUMIN: 2.8 g/dL — AB (ref 3.5–5.0)
ALK PHOS: 754 U/L — AB (ref 38–126)
ALT: 61 U/L (ref 17–63)
ANION GAP: 11 (ref 5–15)
AST: 45 U/L — ABNORMAL HIGH (ref 15–41)
BUN: 6 mg/dL (ref 6–20)
CHLORIDE: 101 mmol/L (ref 101–111)
CO2: 23 mmol/L (ref 22–32)
Calcium: 8.7 mg/dL — ABNORMAL LOW (ref 8.9–10.3)
Creatinine, Ser: 0.66 mg/dL (ref 0.61–1.24)
GFR calc Af Amer: >60 mL/min (ref >60)
GFR calc non Af Amer: >60 mL/min (ref >60)
GLUCOSE: 76 mg/dL (ref 65–99)
POTASSIUM: 4.1 mmol/L (ref 3.5–5.1)
SODIUM: 135 mmol/L (ref 135–145)
Total Bilirubin: 7.4 mg/dL — ABNORMAL HIGH (ref 0.3–1.2)
Total Protein: 6.5 g/dL (ref 6.5–8.1)

## 2014-09-12 LAB — CBC
HCT: 29.5 % — ABNORMAL LOW (ref 39.0–52.0)
HEMOGLOBIN: 9.9 g/dL — AB (ref 13.0–17.0)
MCH: 30.7 pg (ref 26.0–34.0)
MCHC: 33.6 g/dL (ref 30.0–36.0)
MCV: 91.6 fL (ref 78.0–100.0)
Platelets: 299 10*3/uL (ref 150–400)
RBC: 3.22 MIL/uL — ABNORMAL LOW (ref 4.22–5.81)
RDW: 15.4 % (ref 11.5–15.5)
WBC: 4.7 10*3/uL (ref 4.0–10.5)

## 2014-09-12 LAB — GLUCOSE, CAPILLARY: Glucose-Capillary: 76 mg/dL (ref 65–99)

## 2014-09-12 LAB — CANCER ANTIGEN 19-9: CA 19 9: 35 U/mL (ref 0–35)

## 2014-09-12 MED ORDER — LORAZEPAM 0.5 MG PO TABS
0.5000 mg | ORAL_TABLET | Freq: Four times a day (QID) | ORAL | Status: DC | PRN
  Administered 2014-09-12: 0.5 mg via ORAL

## 2014-09-12 NOTE — Progress Notes (Signed)
[facility] Progress Note    Since last GI note: Doing well, no complaints.  Sitting up in chair  Objective: Vital signs in last 24 hours: Temp:  [97.9 F (36.6 C)-98.7 F (37.1 C)] 98.2 F (36.8 C) (09/08 0938) Pulse Rate:  [75-85] 85 (09/08 0938) Resp:  [17-18] 18 (09/08 0938) BP: (120-140)/(86-92) 130/91 mmHg (09/08 0938) SpO2:  [99 %-100 %] 100 % (09/08 0938) Weight:  [139 lb 5.3 oz (63.2 kg)] 139 lb 5.3 oz (63.2 kg) (09/07 2036) Last BM Date:  (PTA) General: alert and oriented times 3, jaundiced Heart: regular rate and rythm Abdomen: soft, non-tender, non-distended, normal bowel sounds   Lab Results:  Recent Labs  09/10/14 0518 09/11/14 0548 09/12/14 0547  WBC 7.2 6.0 4.7  HGB 10.1* 9.9* 9.9*  PLT 335 314 299  MCV 90.3 91.0 91.6    Recent Labs  09/10/14 0518 09/11/14 0548 09/12/14 0547  NA 132* 133* 135  K 4.5 4.1 4.1  CL 100* 100* 101  CO2 24 25 23   GLUCOSE 77 77 76  BUN 18 11 6   CREATININE 0.76 0.67 0.66  CALCIUM 8.8* 8.7* 8.7*    Recent Labs  09/10/14 0518 09/11/14 0548 09/12/14 0547  PROT 5.9* 6.2* 6.5  ALBUMIN 2.7* 2.7* 2.8*  AST 67* 53* 45*  ALT 71* 63 61  ALKPHOS 819* 736* 754*  BILITOT 7.1* 7.2* 7.4*    Recent Labs  09/10/14 0518  INR 1.08     Studies/Results: Ct Abdomen Pelvis W Wo Contrast  09/10/2014   CLINICAL DATA:  Progressive biliary dilatation, with hypodense pancreatic lesion concerning for possible malignancy on prior CT.  EXAM: CT ABDOMEN AND PELVIS WITHOUT AND WITH CONTRAST  TECHNIQUE: Multidetector CT imaging of the abdomen and pelvis was performed following the standard protocol before and following the bolus administration of intravenous contrast.  CONTRAST:  167mL OMNIPAQUE IOHEXOL 350 MG/ML SOLN  COMPARISON:  08/31/2014; 07/24/2014  FINDINGS: Lower chest: Posterior basal segment subsegmental atelectasis bilaterally.  Hepatobiliary: Considerable intrahepatic and extrahepatic biliary dilatation, new  compared to 01/12/14, extrahepatic bile duct measuring up to 2 cm diameter. Fairly abrupt tapering of the common bile duct in the vicinity of the pancreatic head where there is some faint enhancement along the distal visualize margin of the CBD. This tapering seems to be about a cm proximal to the expected location of the ampulla.  Mild wall thickening of the gallbladder, with mildly complex fluid within the gallbladder.  Pancreas: Dilated and somewhat beaded dorsal pancreatic duct. This terminates in the vicinity of the pancreatic head where there appears to be a 2.6 by 2.4 cm hypoenhancing mass on the arterial phase images. When I compared back to 01/12/2014, this mass appears to be new.  Spleen: Unremarkable  Adrenals/Urinary Tract: Peripheral scarring of the right kidney lower pole.  Stomach/Bowel: Prominent stool throughout the colon favors constipation. Scattered mildly dilated loops of upper abdominal small bowel. Appendix normal.  Vascular/Lymphatic: Upper peripancreatic lymph node 1.2 cm in short axis, image 48 series 2. 1.1 cm right porta hepatis lymph node, image 45 series 2. Additional smaller porta hepatis lymph nodes are present.  Variant anatomy of the hepatic arteries, with the lateral segment left hepatic artery arising from a trifurcation of the celiac trunk, and the right hepatic artery and medial segment left hepatic artery arising from the proper hepatic artery.  With regard to vascular involvement by the tumor, there appears to be an intact fat plane between the SMA and the pancreatic head  hypodense mass. The gastroduodenal artery is near the margin of the mass with some indistinct fat planes but is not definitely surrounded by tumor. The tumor does appear to flatten the upper portion of the SMV on images 51-55 of series 2, without definite invasion of the vein.  Small retroperitoneal lymph nodes are not pathologically enlarged by size criteria.  Aortoiliac atherosclerotic vascular disease.   Reproductive: The mildly heterogeneous central zone of the prostate gland likely from mild benign prostatic hypertrophy.  Other: No supplemental non-categorized findings.  Musculoskeletal: Unremarkable  IMPRESSION: 1. 2.6 by 2.4 cm hypoenhancing mass in the pancreatic head associated with obstruction of the distal common bile duct and of the dorsal pancreatic duct. Appearance highly concerning for pancreatic adenocarcinoma. Tissue diagnosis recommended. 2. The mass minimally abuts the proximal SMV, without obvious invasion. Part of the mass is adjacent to the gastroduodenal artery. 3. Variant vascular anatomy of the liver in which the arterial supply to the lateral segment left hepatic lobe comes directly off the celiac trunk at a distal trifurcation. 4. Small but possibly malignant peripancreatic lymph nodes. 5. Gallbladder wall thickening, with suspected sludge in the gallbladder causing a higher than fluid density appearance. 6. No focal hepatic metastatic lesion is are identified.   Electronically Signed   By: Van Clines M.D.   On: 09/10/2014 12:01     Medications: Scheduled Meds: . aspirin  81 mg Oral Daily  . benztropine  0.5 mg Oral BID  . divalproex  500 mg Oral BID  . feeding supplement (ENSURE ENLIVE)  237 mL Oral BID BM  . folic acid  1 mg Oral Daily  . [START ON 09/29/2014] haloperidol decanoate  100 mg Intramuscular Q28 days  . heparin subcutaneous  5,000 Units Subcutaneous 3 times per day  . hydrALAZINE  10 mg Oral BID  . lactulose  20 g Oral BID  . levETIRAcetam  1,000 mg Oral BID  . lipase/protease/amylase  12,000 Units Oral TID AC  . multivitamin with minerals  1 tablet Oral Daily  . nicotine  21 mg Transdermal Daily  . polyethylene glycol  17 g Oral Daily  . QUEtiapine  50 mg Oral QHS  . sodium chloride  3 mL Intravenous Q12H  . thiamine  100 mg Oral Daily  . traZODone  100 mg Oral QHS  . vitamin B-12  500 mcg Oral Daily   Continuous Infusions: . sodium chloride 50  mL/hr at 09/11/14 2250   PRN Meds:.LORazepam, ondansetron **OR** ondansetron (ZOFRAN) IV, oxyCODONE    Assessment/Plan: 49 y.o. male with biliary obstruction, abnormal pancreas, Plavix stopped 2-3 days ago  He is currently scheduled for EUS/ERCP tomorrow. Will stop his [place] heparin after the PM dose tonight. Should plan for likely d/c tomorrow as long as all goes smoothly with the procedures. He lives in care facility in [place] I believe.     Milus Banister, MD  09/12/2014, 11:12 AM [facility] Pager [phone]

## 2014-09-12 NOTE — Progress Notes (Signed)
Patient Demographics  Chase Blackwell, is a 49 y.o. male, DOB - [birth year], WUJ:[id number]  Admit date - 09/09/2014   Admitting Physician Allyne Gee, MD  Outpatient Primary MD for the patient is [name], MD  LOS - 2   Chief Complaint  Patient presents with  . 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 -with elevated LFTs, abdominal pain, with CBD and pancreatic duct dilation  - GI following, plan is for ERCP/EUS tomorrow as patient was on Plavix ( currently held) - LFTS improving - CA 19-9 35  Hyperlipidemia -held statin  Chronic alcoholic pancreatitis - Continue with pancrealipase  Abnormal LFTS -related to alcoholic liver disease, mainly significant elevation of Alk Phos which is indicative of billiary obstruction from #1 -Elevated ammonia level, started on lactulose  Hypertension - Blood pressure on the lower side, held lisinopril and metoprolol  Seizures - Continue with Depakote and Keppra  Chronic diastolic CHF - Appears to be euvolemic, Lasix on hold  Schizophrenia - Continue with Seroquel, continue with Tylenol as needed - Patient does not have ability to make decisions for herself as previously evaluated by psychiatry -Brother Kasandra Knudsen makes decisions per patient  Coronary artery disease - on  aspirin, held Plavix, for EUS/ERCP - stable  DVT Prophylaxis  Heparin - SCDs   Code Status: Full  Family Communication: None at bedside, will call  Brother Danny  Disposition Plan:  back to SNF  post ERCP if stable, 1-2days  Procedures  None   Consults   GI   Medications  Scheduled Meds: . aspirin  81 mg Oral Daily  . benztropine  0.5 mg Oral BID  . divalproex  500 mg Oral BID  . feeding supplement (ENSURE ENLIVE)  237 mL Oral BID BM  . folic acid  1 mg Oral Daily  . [START ON 09/29/2014] haloperidol decanoate  100 mg Intramuscular Q28 days  . heparin subcutaneous  5,000 Units Subcutaneous 3 times per day  . hydrALAZINE  10 mg Oral BID  . lactulose  20 g Oral BID  . levETIRAcetam  1,000 mg Oral BID  . lipase/protease/amylase  12,000 Units Oral TID AC  . multivitamin with minerals  1 tablet Oral Daily  . nicotine  21 mg Transdermal Daily  . polyethylene glycol  17 g Oral Daily  . QUEtiapine  50 mg Oral QHS  . sodium chloride  3 mL Intravenous Q12H  . thiamine  100 mg Oral Daily  . traZODone  100 mg Oral QHS  . vitamin B-12  500 mcg Oral Daily   Continuous Infusions: . sodium chloride 50 mL/hr at 09/11/14 2250   PRN Meds:.LORazepam, ondansetron **OR** ondansetron (ZOFRAN) IV, oxyCODONE  Lab Results  Component Value Date   PLT 299 09/12/2014    Antibiotics   Anti-infectives    None  Objective:   Filed Vitals:   09/11/14 1650 09/11/14 2036 09/12/14 0530 09/12/14 0938  BP: 132/92 140/86 120/87 130/91  Pulse: 75 80 81 85  Temp: 98.1 F (36.7 C) 98.7 F (37.1 C) 97.9 F (36.6 C) 98.2 F (36.8 C)  TempSrc: Oral Oral Oral Oral  Resp: $Remo'18 17 18 18  'kLAmb$ Height:      Weight:  63.2 kg (139 lb 5.3 oz)    SpO2: 100% 100% 99% 100%    Wt Readings from Last 3 Encounters:  09/11/14 63.2 kg (139 lb 5.3 oz)  09/06/14 64.411 kg (142 lb)  08/03/14 61.3 kg (135 lb 2.3 oz)     Intake/Output Summary (Last 24 hours) at 09/12/14 1114 Last data filed at 09/12/14 0939  Gross per 24 hour  Intake 3562.33 ml  Output   1150 ml  Net 2412.33 ml     Physical Exam  Awake Alert, jaundiced, no  distress [place].AT,PERRAL Supple Neck,No JVD,  Symmetrical Chest wall movement, Good air movement bilaterally,  RRR,No Gallops,Rubs or new Murmurs, No Parasternal Heave Abd Soft, No tenderness, no ascites appreciated Ext: No Cyanosis, Clubbing or edema,   Data Review   Micro Results No results found for this or any previous visit (from the past 240 hour(s)).  Radiology Reports Ct Abdomen Pelvis W Wo Contrast  09/10/2014   CLINICAL DATA:  Progressive biliary dilatation, with hypodense pancreatic lesion concerning for possible malignancy on prior CT.  EXAM: CT ABDOMEN AND PELVIS WITHOUT AND WITH CONTRAST  TECHNIQUE: Multidetector CT imaging of the abdomen and pelvis was performed following the standard protocol before and following the bolus administration of intravenous contrast.  CONTRAST:  117mL OMNIPAQUE IOHEXOL 350 MG/ML SOLN  COMPARISON:  08/31/2014; 07/24/2014  FINDINGS: Lower chest: Posterior basal segment subsegmental atelectasis bilaterally.  Hepatobiliary: Considerable intrahepatic and extrahepatic biliary dilatation, new compared to 01/12/14, extrahepatic bile duct measuring up to 2 cm diameter. Fairly abrupt tapering of the common bile duct in the vicinity of the pancreatic head where there is some faint enhancement along the distal visualize margin of the CBD. This tapering seems to be about a cm proximal to the expected location of the ampulla.  Mild wall thickening of the gallbladder, with mildly complex fluid within the gallbladder.  Pancreas: Dilated and somewhat beaded dorsal pancreatic duct. This terminates in the vicinity of the pancreatic head where there appears to be a 2.6 by 2.4 cm hypoenhancing mass on the arterial phase images. When I compared back to 01/12/2014, this mass appears to be new.  Spleen: Unremarkable  Adrenals/Urinary Tract: Peripheral scarring of the right kidney lower pole.  Stomach/Bowel: Prominent stool throughout the colon favors constipation. Scattered mildly  dilated loops of upper abdominal small bowel. Appendix normal.  Vascular/Lymphatic: Upper peripancreatic lymph node 1.2 cm in short axis, image 48 series 2. 1.1 cm right porta hepatis lymph node, image 45 series 2. Additional smaller porta hepatis lymph nodes are present.  Variant anatomy of the hepatic arteries, with the lateral segment left hepatic artery arising from a trifurcation of the celiac trunk, and the right hepatic artery and medial segment left hepatic artery arising from the proper hepatic artery.  With regard to vascular involvement by the tumor, there appears to be an intact fat plane between the SMA and the pancreatic head hypodense mass. The gastroduodenal artery is near the margin of the mass with some indistinct fat planes but is not definitely surrounded by tumor. The tumor does appear to flatten the upper portion of  the SMV on images 51-55 of series 2, without definite invasion of the vein.  Small retroperitoneal lymph nodes are not pathologically enlarged by size criteria.  Aortoiliac atherosclerotic vascular disease.  Reproductive: The mildly heterogeneous central zone of the prostate gland likely from mild benign prostatic hypertrophy.  Other: No supplemental non-categorized findings.  Musculoskeletal: Unremarkable  IMPRESSION: 1. 2.6 by 2.4 cm hypoenhancing mass in the pancreatic head associated with obstruction of the distal common bile duct and of the dorsal pancreatic duct. Appearance highly concerning for pancreatic adenocarcinoma. Tissue diagnosis recommended. 2. The mass minimally abuts the proximal SMV, without obvious invasion. Part of the mass is adjacent to the gastroduodenal artery. 3. Variant vascular anatomy of the liver in which the arterial supply to the lateral segment left hepatic lobe comes directly off the celiac trunk at a distal trifurcation. 4. Small but possibly malignant peripancreatic lymph nodes. 5. Gallbladder wall thickening, with suspected sludge in the  gallbladder causing a higher than fluid density appearance. 6. No focal hepatic metastatic lesion is are identified.   Electronically Signed   By: Gaylyn Rong M.D.   On: 09/10/2014 12:01   Ct Abdomen Pelvis W Contrast  08/31/2014   CLINICAL DATA:  Upper abdominal pain. Increase bilirubin. Recent hospitalization for pancreatitis.  EXAM: CT ABDOMEN AND PELVIS WITH CONTRAST  TECHNIQUE: Multidetector CT imaging of the abdomen and pelvis was performed using the standard protocol following bolus administration of intravenous contrast.  CONTRAST:  OMNIPAQUE IOHEXOL 300 MG/ML  SOLN  COMPARISON:  08/03/2014.  MRI 07/24/2014  FINDINGS: Lung bases are clear. No effusions. Heart is normal size. Coronary artery calcifications noted in the visualized right coronary artery.  There is worsening biliary ductal dilatation, both intrahepatic and extrahepatic. Gallbladder is distended, similar to prior study. Pancreatic duct is dilated. Area of low density within the pancreatic head. This could reflect edema related pancreatitis, but it is difficult to completely exclude a pancreatic head lesion given the degree of biliary and pancreatic ductal dilatation.  Large stool burden throughout the colon. Views gaseous distention of bowel may reflect mild ileus. Aorta and iliac vessels are calcified, non aneurysmal.  Spleen, adrenals and kidneys are unremarkable. No free fluid, free air or adenopathy.  No acute bony abnormality or focal bone lesion.  IMPRESSION: Increasing biliary ductal dilatation within the liver and in the common bile duct. Continued pancreatic ductal dilatation. There is an area of ill-defined low-density in the pancreatic head which could reflect edema related to acute pancreatitis, but I cannot exclude pancreatic head lesion. After acute symptoms resolve, MRI of the abdomen would be beneficial to further evaluate. Followup MRI was also recommended on prior MRI of the abdomen.  Large stool burden  throughout the colon. Mild diffuse gaseous distention of bowel may reflect ileus.  Right coronary artery calcifications.   Electronically Signed   By: Charlett Nose M.D.   On: 08/31/2014 21:56     CBC  Recent Labs Lab 09/06/14 1043 09/09/14 2153 09/10/14 0518 09/11/14 0548 09/12/14 0547  WBC 9.9 8.9 7.2 6.0 4.7  HGB 11.5* 10.6* 10.1* 9.9* 9.9*  HCT 32.4* 30.8* 29.9* 29.3* 29.5*  PLT 450* 370 335 314 299  MCV 88.3 90.1 90.3 91.0 91.6  MCH 31.3 31.0 30.5 30.7 30.7  MCHC 35.5 34.4 33.8 33.8 33.6  RDW 14.9 15.7* 15.7* 15.7* 15.4  LYMPHSABS 1.9 1.9  --   --   --   MONOABS 0.8 0.6  --   --   --  EOSABS 0.9* 0.8*  --   --   --   BASOSABS 0.1 0.0  --   --   --     Chemistries   Recent Labs Lab 09/06/14 1043 09/09/14 2153 09/10/14 0518 09/11/14 0548 09/12/14 0547  NA 132* 129* 132* 133* 135  K 4.9 4.6 4.5 4.1 4.1  CL 93* 93* 100* 100* 101  CO2 $Re'29 27 24 25 23  'jVa$ GLUCOSE 90 109* 77 77 76  BUN $Re'16 19 18 11 6  'zig$ CREATININE 0.93 0.93 0.76 0.67 0.66  CALCIUM 9.2 9.0 8.8* 8.7* 8.7*  AST 69* 66* 67* 53* 45*  ALT 84* 75* 71* 63 61  ALKPHOS 870* 859* 819* 736* 754*  BILITOT 7.9* 7.7* 7.1* 7.2* 7.4*   ------------------------------------------------------------------------------------------------------------------ estimated creatinine clearance is 100.9 mL/min (by C-G formula based on Cr of 0.66). ------------------------------------------------------------------------------------------------------------------  Recent Labs  09/10/14 0518  HGBA1C 5.1   ------------------------------------------------------------------------------------------------------------------ No results for input(s): CHOL, HDL, LDLCALC, TRIG, CHOLHDL, LDLDIRECT in the last 72 hours. ------------------------------------------------------------------------------------------------------------------  Recent Labs  09/10/14 0518  TSH 5.329*    ------------------------------------------------------------------------------------------------------------------ No results for input(s): VITAMINB12, FOLATE, FERRITIN, TIBC, IRON, RETICCTPCT in the last 72 hours.  Coagulation profile  Recent Labs Lab 09/06/14 1043 09/10/14 0518  INR 0.95 1.08    No results for input(s): DDIMER in the last 72 hours.  Cardiac Enzymes No results for input(s): CKMB, TROPONINI, MYOGLOBIN in the last 168 hours.  Invalid input(s): CK ------------------------------------------------------------------------------------------------------------------ Invalid input(s): POCBNP     Time Spent in minutes   54 minutes  Kelse Ploch M.D on 09/12/2014 at 11:14 AM  Between 7am to 7pm - Pager - [phone]  After 7pm go to [email or web address] - password [facility]  [facility]   Office  [phone]

## 2014-09-12 NOTE — Progress Notes (Signed)
Called pt's brother, [name] to get surgical consent over the phone. He said he would be at the hospital sometime tonight and would sign consent when he got here.   Penni Bombard, RN  09/12/2014 5:16 PM

## 2014-09-13 ENCOUNTER — Inpatient Hospital Stay (HOSPITAL_COMMUNITY): Payer: Medicare Other | Admitting: Anesthesiology

## 2014-09-13 ENCOUNTER — Encounter (HOSPITAL_COMMUNITY)
Admission: EM | Disposition: A | Discharge status: Skilled Nursing Facility | Payer: Self-pay | Source: Home / Self Care | Attending: Internal Medicine

## 2014-09-13 ENCOUNTER — Encounter (HOSPITAL_COMMUNITY): Payer: Self-pay | Admitting: *Deleted

## 2014-09-13 ENCOUNTER — Telehealth: Payer: Self-pay | Admitting: Oncology

## 2014-09-13 ENCOUNTER — Inpatient Hospital Stay (HOSPITAL_COMMUNITY): Payer: Medicare Other

## 2014-09-13 DIAGNOSIS — K869 Disease of pancreas, unspecified: Secondary | ICD-10-CM

## 2014-09-13 DIAGNOSIS — I5022 Chronic systolic (congestive) heart failure: Secondary | ICD-10-CM

## 2014-09-13 HISTORY — PX: ERCP: SHX5425

## 2014-09-13 HISTORY — PX: UPPER ESOPHAGEAL ENDOSCOPIC ULTRASOUND (EUS): SHX6562

## 2014-09-13 LAB — GLUCOSE, CAPILLARY: Glucose-Capillary: 83 mg/dL (ref 65–99)

## 2014-09-13 LAB — SURGICAL PCR SCREEN
MRSA, PCR: NEGATIVE
Staphylococcus aureus: NEGATIVE

## 2014-09-13 SURGERY — ERCP, WITH INTERVENTION IF INDICATED
Anesthesia: General

## 2014-09-13 MED ORDER — SUCCINYLCHOLINE CHLORIDE 20 MG/ML IJ SOLN
INTRAMUSCULAR | Status: DC | PRN
  Administered 2014-09-13: 100 mg via INTRAVENOUS

## 2014-09-13 MED ORDER — LIDOCAINE HCL 4 % MT SOLN
OROMUCOSAL | Status: DC | PRN
  Administered 2014-09-13: 4 mL via TOPICAL

## 2014-09-13 MED ORDER — FENTANYL CITRATE (PF) 100 MCG/2ML IJ SOLN
INTRAMUSCULAR | Status: DC | PRN
  Administered 2014-09-13 (×3): 50 ug via INTRAVENOUS

## 2014-09-13 MED ORDER — LIDOCAINE HCL (CARDIAC) 20 MG/ML IV SOLN
INTRAVENOUS | Status: DC | PRN
  Administered 2014-09-13: 60 mg via INTRAVENOUS

## 2014-09-13 MED ORDER — DIVALPROEX SODIUM 500 MG PO DR TAB: 500.0000 mg | DELAYED_RELEASE_TABLET | Freq: Two times a day (BID) | ORAL | Status: DC

## 2014-09-13 MED ORDER — LACTATED RINGERS IV SOLN
INTRAVENOUS | Status: DC
  Administered 2014-09-13: 500 mL via INTRAVENOUS

## 2014-09-13 MED ORDER — LACTATED RINGERS IV SOLN
INTRAVENOUS | Status: DC | PRN
  Administered 2014-09-13: 11:00:00 via INTRAVENOUS

## 2014-09-13 MED ORDER — PHENYLEPHRINE HCL 10 MG/ML IJ SOLN
10.0000 mg | INTRAVENOUS | Status: DC | PRN
  Administered 2014-09-13: 10 ug/min via INTRAVENOUS

## 2014-09-13 MED ORDER — SODIUM CHLORIDE 0.9 % IV SOLN
INTRAVENOUS | Status: DC | PRN
  Administered 2014-09-13: 30 mL

## 2014-09-13 MED ORDER — ARTIFICIAL TEARS OP OINT
TOPICAL_OINTMENT | OPHTHALMIC | Status: DC | PRN
  Administered 2014-09-13: 1 via OPHTHALMIC

## 2014-09-13 MED ORDER — GLUCAGON HCL RDNA (DIAGNOSTIC) 1 MG IJ SOLR
INTRAMUSCULAR | Status: AC
  Filled 2014-09-13: qty 2

## 2014-09-13 MED ORDER — NICOTINE 21 MG/24HR TD PT24: 21.0000 mg | MEDICATED_PATCH | Freq: Every day | TRANSDERMAL | Status: AC

## 2014-09-13 MED ORDER — SODIUM CHLORIDE 0.9 % IV SOLN: INTRAVENOUS | Status: DC

## 2014-09-13 MED ORDER — PROPOFOL 10 MG/ML IV BOLUS
INTRAVENOUS | Status: DC | PRN
  Administered 2014-09-13: 150 mg via INTRAVENOUS
  Administered 2014-09-13: 50 mg via INTRAVENOUS

## 2014-09-13 MED ORDER — GLUCAGON HCL RDNA (DIAGNOSTIC) 1 MG IJ SOLR
INTRAMUSCULAR | Status: DC | PRN
  Administered 2014-09-13: .5 mg via INTRAVENOUS

## 2014-09-13 NOTE — Clinical Social Work Note (Addendum)
Clinical Social Work Assessment  Patient Details  Name: Chase Blackwell MRN: [id number] Date of Birth: 06/23/1965  Date of referral:  09/13/14               Reason for consult:  Facility Placement                Permission sought to share information with:  Family Supports (Brother Edan Juday (Durable [place]) [phone].) Permission granted to share information::  Yes, Verbal Permission Granted  Name::     Larrie Kass  Agency::     Relationship::  Brother and Durable POA  Contact Information:  [phone]  Housing/Transportation Living arrangements for the past 2 months:  [place] of Information:  Patient Patient Interpreter Needed:  None Criminal Activity/Legal Involvement Pertinent to Current Situation/Hospitalization:  No - Comment as needed Significant Relationships:    Lives with:  Siblings Do you feel safe going back to the place where you live?  Yes Need for family participation in patient care:  Yes (Comment)  Care giving concerns:  None expressed by patient   Social Worker assessment / plan:  CSW talked with patient regarding discharge planning and current SNF placement. Patient advised CSW that he wants [facility] skilled facility as his mother is a patient there. CSW attempted to speak with the admissions staff at [facility] and a message left.  [facility] also contacted and message left for Dorene Sorrow in admissions that patient will be ready for discharge on Saturday.  Mr. Walker informed by CSW that if [facility] unable to accept him, he will discharge back to [facility] and Mr. Lauderback nodded understanding.  CSW attempted to reach patient's brother Hikeem Andersson at [phone] but unable to speak with him. Also could not leave a message as his VM is full.    Employment status:  Disabled (Comment on whether or not currently receiving Disability) Insurance information:  Medicare, Medicaid [place] PT Recommendations:  Not assessed at this time Information /  Referral to community resources:  [place]  Patient/Family's Response to care:  No concerns expressed.  Patient/Family's Understanding of and Emotional Response to Diagnosis, Current Treatment, and Prognosis:  Not discussed.   Emotional Assessment Appearance:  Appears older than stated age, Disheveled Attitude/Demeanor/Rapport:  Lethargic Affect (typically observed):  Other (Patient has a blank facial expression) Orientation:  Oriented to Self, Oriented to Place, Oriented to  Time, Oriented to Situation Alcohol / Substance use:  Tobacco Use, Alcohol Use, Illicit Drugs (Patient reports that he drinks alcohol and uses marijuanna) Psych involvement (Current and /or in the community):  No (Comment) (Not during this hospitalization. Patient is on psych medications for Schizophrenia)  Discharge Needs  Concerns to be addressed:  Discharge Planning Concerns, Decision making concerns (Patient wants to change skilled facilities. CSW unable to contact patient's POA) Readmission within the last 30 days:  Yes Current discharge risk:  Substance Abuse, Psychiatric Illness Barriers to Discharge:  Continued Medical Work up (Patient will discharge on Saturday)   Sable Feil, LCSW 09/13/2014, 5:29 PM

## 2014-09-13 NOTE — Op Note (Signed)
There was an error in endopro Endo writing software so this will serve as the official report:    PATIENT: Chase Blackwell, Chase Blackwell  MR#: [id number]  BIRTHDATE: 11/26/1965  GENDER: male   ENDOSCOPIST: Collene Schlichter  REFERRED BY: Barney Drain, M.D.  PROCEDURE DATE:  09/13/2014  PROCEDURE:   ERCP with stent placement   INDICATIONS:chronic pancreatitis, recent CT suggested mass in head of pancreas with double duct sign, jaundice; EUS just prior to this examination.   MEDICATIONS: Per Anesthesia   TOPICAL ANESTHETIC:   none   DESCRIPTION OF PROCEDURE:     Physical exam was performed.  Informed consent was obtained from the patient after explaining the benefits, risks, and alternatives to the procedure.  The patient was connected to the monitor and placed in the semi-prone position.  IV medicine was administered through an indwelling cannula and oxygen via endotracheal tube.  After administration of sedation, the patients esophagus was intubated and the Pentax Ercp Scope [id number]  endoscope was advanced under direct visualization to the second portion of the duodenum without detailed examination of the UGI tract.  A 44 autotome over a .035 hydrawire was used to cannulate the bile duct and contrast was injected. Cholangiogram revealed a tight, 1cm long mid CBD stricture. The most distal 1cm of the CBD was normal. The biliary tree proximal to the stricture was diffusely dilated (CBD up to 74mm).  Given the preliminary cytology + from EUS FNA, I did not perform biliary brushing.  The stricture was stented with a 6cm long, 86mm diameter fully covered metal mesh stent in good position. Following stent placement there was immediate delivery of dark bile into the duodenum through the stent.  The main pancreatic duct was cannulated with wire one and gently injected with dye once.  The scope was then completely withdrawn from the patient and the procedure terminated. images     COMPLICATIONS:    None  ENDOSCOPIC  IMPRESSION: 1cm long mid CBD stricture, stented with 5mm diameter 6cm long fully covered (removable) metal mesh stent.   RECOMMENDATIONS: Await final cytology from EUS FNA just prior to this examination.  I recommend medical oncology consultation. This can be as in patient or out  patient (however it is noted that he has signficant psychiatric conditions and cannot make his own medical decisions, has signed out [facility] previously but currently admitted IVC).  Please call or page with any further questions or concerns during this admission. Will advance diet and repeat LFTs in the morning. Ok to restart his plavix tomorrow.    _______________________________ Faythe Ghee revised   cc: Barney Drain, MD; Lucio Edward, MD

## 2014-09-13 NOTE — Op Note (Signed)
[facility] [place], [id number]   ENDOSCOPIC ULTRASOUND PROCEDURE REPORT  PATIENT: Chase Blackwell, Chase Blackwell  MR#: [id number] BIRTHDATE: [birth year]  GENDER: male ENDOSCOPIST: Milus Banister, MD REFERRED BY:  Barney Drain, M.D. PROCEDURE DATE:  09/13/2014 PROCEDURE:   Upper EUS w/FNA ASA CLASS:      Class III INDICATIONS:   1.  chronic alcoholic, chronic pancreatitis, recent CT scan shows double duct sign, ? mass in head of pancreas, jaundice. MEDICATIONS: Per Anesthesia DESCRIPTION OF PROCEDURE:   After the risks benefits and alternatives of the procedure were  explained, informed consent was obtained. The patient was then placed in the left, lateral, decubitus postion and IV sedation was administered. Throughout the procedure, the patients blood pressure, pulse and oxygen saturations were monitored continuously.  Under direct visualization, the PENTAX EUS SCOPE  endoscope was introduced through the mouth  and advanced to the    .  Water was used as necessary to provide an acoustic interface.  Upon completion of the imaging, water was removed and the patient was sent to the recovery room in satisfactory condition.  Endoscopic findings: 1. Limited views with radial and linear echoendoscopes were all normal.  EUS findings: 1. The pancreatic parenchyma was diffusely abnormal throughout the gland (lobular, more hypoechoic than usual, hyperchoic stands and foci).  The parenchyma in the region of the head of pancreas was somewhat vaguely masslike for about 3.5cm but margins are very unclear, indiscrete.  This region directly abuts the main portal vein for 1cm.  This somewhat masslike area was sampled with 3 transduodenal passes with a 25 guage EUS FNA needle. 2. The CBD was dilated into the head of pancreas, into the region of masslike parenchyma (up to 1.7cm). 3. Main pancreatic duct was dilated throughout (40mm in head, 45mm in body and dail). 4.  No peripancreatic adenopathy 5. Limited views of liver, spleen were all normal.  ENDOSCOPIC IMPRESSION: Changes of chronic pancreatitis throughout the gland. Vague, mass-like region in the head of pancreas associated with dilated biliary and pancreatic ducts. This mass-like area directly abuts portal vein along 1cm.  The mass-like region was sampled with FNA, preliminary cytology is + for neoplastic cells.  RECOMMENDATIONS: Await final cytology reading.  _______________________________ eSigned:  Milus Banister, MD 09/13/2014 12:38 PM    PATIENT NAME:  Chase Blackwell, Chase Blackwell MR#: [id number]

## 2014-09-13 NOTE — Anesthesia Preprocedure Evaluation (Addendum)
Anesthesia Evaluation  Patient identified by MRN, date of birth, ID band Patient awake    Reviewed: Allergy & Precautions, NPO status , Patient's Chart, lab work & pertinent test results  Airway Mallampati: II  TM Distance: >3 FB Neck ROM: Full    Dental  (+) Poor Dentition, Missing, Dental Advisory Given   Pulmonary asthma , Current Smoker,    breath sounds clear to auscultation       Cardiovascular hypertension, Pt. on medications and Pt. on home beta blockers + CAD, + Past MI and +CHF   Rhythm:Regular Rate:Normal     Neuro/Psych Seizures -,  Depression Schizophrenia CVA    GI/Hepatic Neg liver ROS, GERD  ,  Endo/Other  negative endocrine ROS  Renal/GU negative Renal ROS     Musculoskeletal   Abdominal   Peds  Hematology  (+) anemia ,   Anesthesia Other Findings   Reproductive/Obstetrics                            Anesthesia Physical Anesthesia Plan  ASA: III  Anesthesia Plan: General   Post-op Pain Management:    Induction: Intravenous  Airway Management Planned: Oral ETT  Additional Equipment:   Intra-op Plan:   Post-operative Plan: Extubation in OR  Informed Consent: I have reviewed the patients History and Physical, chart, labs and discussed the procedure including the risks, benefits and alternatives for the proposed anesthesia with the patient or authorized representative who has indicated his/her understanding and acceptance.   Dental advisory given  Plan Discussed with: CRNA  Anesthesia Plan Comments:         Anesthesia Quick Evaluation

## 2014-09-13 NOTE — Anesthesia Postprocedure Evaluation (Signed)
  Anesthesia Post-op Note  Patient: Chase Blackwell  Procedure(s) Performed: Procedure(s): ENDOSCOPIC RETROGRADE CHOLANGIOPANCREATOGRAPHY (ERCP) (N/A) UPPER ESOPHAGEAL ENDOSCOPIC ULTRASOUND (EUS) (N/A)  Patient Location: PACU  Anesthesia Type:General  Level of Consciousness: awake and alert   Airway and Oxygen Therapy: Patient Spontanous Breathing  Post-op Pain: none  Post-op Assessment: Post-op Vital signs reviewed              Post-op Vital Signs: Reviewed  Last Vitals:  Filed Vitals:   09/13/14 1300  BP: 114/74  Pulse:   Temp: 36.8 C  Resp: 18    Complications: No apparent anesthesia complications

## 2014-09-13 NOTE — Progress Notes (Signed)
Patient Demographics  Chase Blackwell, is a 49 y.o. male, DOB - Mar 03, 1965, IWL:[id number]  Admit date - 09/09/2014   Admitting Physician Allyne Gee, MD  Outpatient Primary MD for the patient is Chase Gravel, MD  LOS - 3   Chief Complaint  Patient presents with  . 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 - with elevated LFTs, abdominal pain, with CBD and pancreatic duct dilation  - GI following, for ERCP/EUS today, procedure delayed due to Plavix ( currently held) - LFTS improving - CA 19-9 35 - made FU with Dr.Shadad on Tuesday 9/13 at 10:30am  Hyperlipidemia -held statin  Chronic alcoholic pancreatitis - Continue with pancrealipase  Abnormal LFTS -related to alcoholic liver disease, mainly significant elevation of Alk Phos which is indicative of billiary obstruction from #1 -Elevated ammonia level, started on lactulose  Hypertension - Blood pressure on the lower side, held lisinopril and metoprolol  Seizures - Continue with Depakote and Keppra  Chronic diastolic CHF - Appears to be euvolemic, Lasix on hold  Schizophrenia - Continue with Seroquel, continue with Tylenol as needed - Patient does not have ability to make decisions for herself as previously evaluated by psychiatry -Brother Kasandra Knudsen makes decisions per patient  Coronary artery disease - on  aspirin, held Plavix, for EUS/ERCP - stable  DVT Prophylaxis  Heparin - SCDs   Code  Status: Full  Family Communication: None at bedside, d/w Brother Danny 9/8  Disposition Plan:  back to SNF tomorrow post ERCP if stable  Procedures  None   Consults   GI   Medications  Scheduled Meds: . aspirin  81 mg Oral Daily  . benztropine  0.5 mg Oral BID  . divalproex  500 mg Oral BID  . feeding supplement (ENSURE ENLIVE)  237 mL Oral BID BM  . folic acid  1 mg Oral Daily  . [START ON 09/29/2014] haloperidol decanoate  100 mg Intramuscular Q28 days  . hydrALAZINE  10 mg Oral BID  . lactulose  20 g Oral BID  . levETIRAcetam  1,000 mg Oral BID  . lipase/protease/amylase  12,000 Units Oral TID AC  . multivitamin with minerals  1 tablet Oral Daily  . nicotine  21 mg Transdermal Daily  . polyethylene glycol  17 g Oral Daily  . QUEtiapine  50 mg Oral QHS  . sodium chloride  3 mL Intravenous Q12H  . thiamine  100 mg Oral Daily  . traZODone  100 mg Oral QHS  . vitamin B-12  500 mcg Oral Daily   Continuous Infusions: . sodium chloride 50 mL/hr at 09/11/14 2250  . sodium chloride    . lactated ringers 500 mL (09/13/14 1024)   PRN Meds:.LORazepam, ondansetron **OR** ondansetron (ZOFRAN) IV, oxyCODONE  Lab Results  Component Value Date   PLT 299 09/12/2014  Antibiotics   Anti-infectives    None          Objective:   Filed Vitals:   09/13/14 1310 09/13/14 1320 09/13/14 1330 09/13/14 1400  BP: 106/66 105/68 115/65 111/72  Pulse: 71 77 74 73  Temp:    97.7 F (36.5 C)  TempSrc:    Oral  Resp: $Remo'15 26 13 16  'vfIKS$ Height:      Weight:      SpO2: 100% 99% 97% 98%    Wt Readings from Last 3 Encounters:  09/11/14 63.2 kg (139 lb 5.3 oz)  09/06/14 64.411 kg (142 lb)  08/03/14 61.3 kg (135 lb 2.3 oz)     Intake/Output Summary (Last 24 hours) at 09/13/14 1412 Last data filed at 09/13/14 1200  Gross per 24 hour  Intake   1280 ml  Output    251 ml  Net   1029 ml     Physical Exam  Awake Alert, jaundiced, no distress Danube.AT,PERRAL Supple Neck,No JVD,    Symmetrical Chest wall movement, Good air movement bilaterally,  RRR,No Gallops,Rubs or new Murmurs, No Parasternal Heave Abd Soft, No tenderness, no ascites appreciated Ext: No Cyanosis, Clubbing or edema,   Data Review   Micro Results Recent Results (from the past 240 hour(s))  Surgical pcr screen     Status: None   Collection Time: 09/13/14  7:40 AM  Result Value Ref Range Status   MRSA, PCR NEGATIVE NEGATIVE Final   Staphylococcus aureus NEGATIVE NEGATIVE Final    Comment:        The Xpert SA Assay (FDA approved for NASAL specimens in patients over 48 years of age), is one component of a comprehensive surveillance program.  Test performance has been validated by [facility] for patients greater than or equal to 39 year old. It is not intended to diagnose infection nor to guide or monitor treatment.     Radiology Reports Ct Abdomen Pelvis W Wo Contrast  09/10/2014   CLINICAL DATA:  Progressive biliary dilatation, with hypodense pancreatic lesion concerning for possible malignancy on prior CT.  EXAM: CT ABDOMEN AND PELVIS WITHOUT AND WITH CONTRAST  TECHNIQUE: Multidetector CT imaging of the abdomen and pelvis was performed following the standard protocol before and following the bolus administration of intravenous contrast.  CONTRAST:  168mL OMNIPAQUE IOHEXOL 350 MG/ML SOLN  COMPARISON:  08/31/2014; 07/24/2014  FINDINGS: Lower chest: Posterior basal segment subsegmental atelectasis bilaterally.  Hepatobiliary: Considerable intrahepatic and extrahepatic biliary dilatation, new compared to 01/12/14, extrahepatic bile duct measuring up to 2 cm diameter. Fairly abrupt tapering of the common bile duct in the vicinity of the pancreatic head where there is some faint enhancement along the distal visualize margin of the CBD. This tapering seems to be about a cm proximal to the expected location of the ampulla.  Mild wall thickening of the gallbladder, with mildly complex fluid within the  gallbladder.  Pancreas: Dilated and somewhat beaded dorsal pancreatic duct. This terminates in the vicinity of the pancreatic head where there appears to be a 2.6 by 2.4 cm hypoenhancing mass on the arterial phase images. When I compared back to 01/12/2014, this mass appears to be new.  Spleen: Unremarkable  Adrenals/Urinary Tract: Peripheral scarring of the right kidney lower pole.  Stomach/Bowel: Prominent stool throughout the colon favors constipation. Scattered mildly dilated loops of upper abdominal small bowel. Appendix normal.  Vascular/Lymphatic: Upper peripancreatic lymph node 1.2 cm in short axis, image 48 series 2. 1.1 cm right porta hepatis lymph  node, image 45 series 2. Additional smaller porta hepatis lymph nodes are present.  Variant anatomy of the hepatic arteries, with the lateral segment left hepatic artery arising from a trifurcation of the celiac trunk, and the right hepatic artery and medial segment left hepatic artery arising from the proper hepatic artery.  With regard to vascular involvement by the tumor, there appears to be an intact fat plane between the SMA and the pancreatic head hypodense mass. The gastroduodenal artery is near the margin of the mass with some indistinct fat planes but is not definitely surrounded by tumor. The tumor does appear to flatten the upper portion of the SMV on images 51-55 of series 2, without definite invasion of the vein.  Small retroperitoneal lymph nodes are not pathologically enlarged by size criteria.  Aortoiliac atherosclerotic vascular disease.  Reproductive: The mildly heterogeneous central zone of the prostate gland likely from mild benign prostatic hypertrophy.  Other: No supplemental non-categorized findings.  Musculoskeletal: Unremarkable  IMPRESSION: 1. 2.6 by 2.4 cm hypoenhancing mass in the pancreatic head associated with obstruction of the distal common bile duct and of the dorsal pancreatic duct. 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 Tissue diagnosis recommended. 2. The mass minimally abuts the proximal SMV, without obvious invasion. Part of the mass is adjacent to the gastroduodenal artery. 3. Variant vascular anatomy of the liver in which the arterial supply to the lateral segment left hepatic lobe comes directly off the celiac trunk at a distal trifurcation. 4. Small but possibly malignant peripancreatic lymph nodes. 5. Gallbladder wall thickening, with suspected sludge in the gallbladder causing a higher than fluid density appearance. 6. No focal hepatic metastatic lesion is are identified.   Electronically Signed   By: Van Clines M.D.   On: 09/10/2014 12:01   Ct Abdomen Pelvis W Contrast  08/31/2014   CLINICAL DATA:  Upper abdominal pain. Increase bilirubin. Recent hospitalization for pancreatitis.  EXAM: CT ABDOMEN AND PELVIS WITH CONTRAST  TECHNIQUE: Multidetector CT imaging of the abdomen and pelvis was performed using the standard protocol following bolus administration of intravenous contrast.  CONTRAST:  128mL OMNIPAQUE IOHEXOL 300 MG/ML  SOLN  COMPARISON:  08/03/2014.  MRI 07/24/2014  FINDINGS: Lung bases are clear. No effusions. Heart is normal size. Coronary artery calcifications noted in the visualized right coronary artery.  There is worsening biliary ductal dilatation, both intrahepatic and extrahepatic. Gallbladder is distended, similar to prior study. Pancreatic duct is dilated. Area of low density within the pancreatic head. This could reflect edema related pancreatitis, but it is difficult to completely exclude a pancreatic head lesion given the degree of biliary and pancreatic ductal dilatation.  Large stool burden throughout the colon. Views gaseous distention of bowel may reflect mild ileus. Aorta and iliac vessels are calcified, non aneurysmal.  Spleen, adrenals and kidneys are unremarkable. No free fluid, free air or adenopathy.  No acute bony abnormality or focal bone lesion.  IMPRESSION:  Increasing biliary ductal dilatation within the liver and in the common bile duct. Continued pancreatic ductal dilatation. There is an area of ill-defined low-density in the pancreatic head which could reflect edema related to acute pancreatitis, but I cannot exclude pancreatic head lesion. After acute symptoms resolve, MRI of the abdomen would be beneficial to further evaluate. Followup MRI was also recommended on prior MRI of the abdomen.  Large stool burden throughout the colon. Mild diffuse gaseous distention of bowel may reflect ileus.  Right coronary artery calcifications.   Electronically Signed   By: Lennette Bihari  Dover M.D.   On: 08/31/2014 21:56   Dg Ercp Biliary & Pancreatic Ducts  09/13/2014   CLINICAL DATA:  Obstructive jaundice  EXAM: ERCP  TECHNIQUE: Multiple spot images obtained with the fluoroscopic device and submitted for interpretation post-procedure.  FLUOROSCOPY TIME:  2 minutes 12 seconds  COMPARISON:  CT 09/10/2014  FINDINGS: Six fluoroscopic spot images from ERCP document endoscopic catheterization of the CBD and opacification. The intrahepatic bile ducts are incompletely opacified, dilated centrally. The proximal CBD is dilated. There is a high-grade stenosis in the distal CBD. Final image documents placement of a metallic stent across the distal CBD. The midportion of the stent remains partially constrained.  IMPRESSION: 1. Distal CBD obstruction, with endoscopic metallic stent placement. These images were submitted for radiologic interpretation only. Please see the procedural report for the amount of contrast and the fluoroscopy time utilized.   Electronically Signed   By: Lucrezia Europe M.D.   On: 09/13/2014 13:04     CBC  Recent Labs Lab 09/09/14 2153 09/10/14 0518 09/11/14 0548 09/12/14 0547  WBC 8.9 7.2 6.0 4.7  HGB 10.6* 10.1* 9.9* 9.9*  HCT 30.8* 29.9* 29.3* 29.5*  PLT 370 335 314 299  MCV 90.1 90.3 91.0 91.6  MCH 31.0 30.5 30.7 30.7  MCHC 34.4 33.8 33.8 33.6  RDW 15.7*  15.7* 15.7* 15.4  LYMPHSABS 1.9  --   --   --   MONOABS 0.6  --   --   --   EOSABS 0.8*  --   --   --   BASOSABS 0.0  --   --   --     Chemistries   Recent Labs Lab 09/09/14 2153 09/10/14 0518 09/11/14 0548 09/12/14 0547  NA 129* 132* 133* 135  K 4.6 4.5 4.1 4.1  CL 93* 100* 100* 101  CO2 $Re'27 24 25 23  'vcl$ GLUCOSE 109* 77 77 76  BUN $Re'19 18 11 6  'ELN$ CREATININE 0.93 0.76 0.67 0.66  CALCIUM 9.0 8.8* 8.7* 8.7*  AST 66* 67* 53* 45*  ALT 75* 71* 63 61  ALKPHOS 859* 819* 736* 754*  BILITOT 7.7* 7.1* 7.2* 7.4*   ------------------------------------------------------------------------------------------------------------------ estimated creatinine clearance is 100.9 mL/min (by C-G formula based on Cr of 0.66). ------------------------------------------------------------------------------------------------------------------ No results for input(s): HGBA1C in the last 72 hours. ------------------------------------------------------------------------------------------------------------------ No results for input(s): CHOL, HDL, LDLCALC, TRIG, CHOLHDL, LDLDIRECT in the last 72 hours. ------------------------------------------------------------------------------------------------------------------ No results for input(s): TSH, T4TOTAL, T3FREE, THYROIDAB in the last 72 hours.  Invalid input(s): FREET3 ------------------------------------------------------------------------------------------------------------------ No results for input(s): VITAMINB12, FOLATE, FERRITIN, TIBC, IRON, RETICCTPCT in the last 72 hours.  Coagulation profile  Recent Labs Lab 09/10/14 0518  INR 1.08    No results for input(s): DDIMER in the last 72 hours.  Cardiac Enzymes No results for input(s): CKMB, TROPONINI, MYOGLOBIN in the last 168 hours.  Invalid input(s): CK ------------------------------------------------------------------------------------------------------------------ Invalid input(s):  POCBNP     Time Spent in minutes   12 minutes  Sira Adsit M.D on 09/13/2014 at 2:12 PM  Between 7am to 7pm - Pager - [phone]  After 7pm go to [email or web address] - password [facility]  [facility]   Office  [phone]

## 2014-09-13 NOTE — Anesthesia Procedure Notes (Signed)
Procedure Name: Intubation Date/Time: 09/13/2014 11:20 AM Performed by: Izora Gala Pre-anesthesia Checklist: Patient identified, Emergency Drugs available, Suction available and Patient being monitored Patient Re-evaluated:Patient Re-evaluated prior to inductionOxygen Delivery Method: Circle system utilized Preoxygenation: Pre-oxygenation with 100% oxygen Intubation Type: IV induction Ventilation: Mask ventilation without difficulty Laryngoscope Size: Miller and 3 Grade View: Grade I Tube type: Oral Tube size: 7.5 mm Airway Equipment and Method: Stylet and LTA kit utilized Placement Confirmation: ETT inserted through vocal cords under direct vision,  positive ETCO2 and breath sounds checked- equal and bilateral Secured at: 22 cm Tube secured with: Tape Dental Injury: Teeth and Oropharynx as per pre-operative assessment

## 2014-09-13 NOTE — Telephone Encounter (Signed)
New patient appt-s/w patient and gave np appt for 09/13 @ 10:30 w/ Dr. Alen Blew.  Referring Dr. Broadus John Dx-mass in head of pancreas

## 2014-09-13 NOTE — Care Management Important Message (Signed)
Important Message  Patient Details  Name: Chase Blackwell MRN: [id number] Date of Birth: August 22, 1965   Medicare Important Message Given:  Yes-second notification given    Delorse Lek 09/13/2014, 11:59 AM

## 2014-09-13 NOTE — H&P (View-Only) (Signed)
[facility] Progress Note    Since last GI note: Doing well, no complaints.  Sitting up in chair  Objective: Vital signs in last 24 hours: Temp:  [97.9 F (36.6 C)-98.7 F (37.1 C)] 98.2 F (36.8 C) (09/08 0938) Pulse Rate:  [75-85] 85 (09/08 0938) Resp:  [17-18] 18 (09/08 0938) BP: (120-140)/(86-92) 130/91 mmHg (09/08 0938) SpO2:  [99 %-100 %] 100 % (09/08 0938) Weight:  [139 lb 5.3 oz (63.2 kg)] 139 lb 5.3 oz (63.2 kg) (09/07 2036) Last BM Date:  (PTA) General: alert and oriented times 3, jaundiced Heart: regular rate and rythm Abdomen: soft, non-tender, non-distended, normal bowel sounds   Lab Results:  Recent Labs  09/10/14 0518 09/11/14 0548 09/12/14 0547  WBC 7.2 6.0 4.7  HGB 10.1* 9.9* 9.9*  PLT 335 314 299  MCV 90.3 91.0 91.6    Recent Labs  09/10/14 0518 09/11/14 0548 09/12/14 0547  NA 132* 133* 135  K 4.5 4.1 4.1  CL 100* 100* 101  CO2 24 25 23   GLUCOSE 77 77 76  BUN 18 11 6   CREATININE 0.76 0.67 0.66  CALCIUM 8.8* 8.7* 8.7*    Recent Labs  09/10/14 0518 09/11/14 0548 09/12/14 0547  PROT 5.9* 6.2* 6.5  ALBUMIN 2.7* 2.7* 2.8*  AST 67* 53* 45*  ALT 71* 63 61  ALKPHOS 819* 736* 754*  BILITOT 7.1* 7.2* 7.4*    Recent Labs  09/10/14 0518  INR 1.08     Studies/Results: Ct Abdomen Pelvis W Wo Contrast  09/10/2014   CLINICAL DATA:  Progressive biliary dilatation, with hypodense pancreatic lesion concerning for possible malignancy on prior CT.  EXAM: CT ABDOMEN AND PELVIS WITHOUT AND WITH CONTRAST  TECHNIQUE: Multidetector CT imaging of the abdomen and pelvis was performed following the standard protocol before and following the bolus administration of intravenous contrast.  CONTRAST:  137mL OMNIPAQUE IOHEXOL 350 MG/ML SOLN  COMPARISON:  08/31/2014; 07/24/2014  FINDINGS: Lower chest: Posterior basal segment subsegmental atelectasis bilaterally.  Hepatobiliary: Considerable intrahepatic and extrahepatic biliary dilatation, new  compared to 01/12/14, extrahepatic bile duct measuring up to 2 cm diameter. Fairly abrupt tapering of the common bile duct in the vicinity of the pancreatic head where there is some faint enhancement along the distal visualize margin of the CBD. This tapering seems to be about a cm proximal to the expected location of the ampulla.  Mild wall thickening of the gallbladder, with mildly complex fluid within the gallbladder.  Pancreas: Dilated and somewhat beaded dorsal pancreatic duct. This terminates in the vicinity of the pancreatic head where there appears to be a 2.6 by 2.4 cm hypoenhancing mass on the arterial phase images. When I compared back to 01/12/2014, this mass appears to be new.  Spleen: Unremarkable  Adrenals/Urinary Tract: Peripheral scarring of the right kidney lower pole.  Stomach/Bowel: Prominent stool throughout the colon favors constipation. Scattered mildly dilated loops of upper abdominal small bowel. Appendix normal.  Vascular/Lymphatic: Upper peripancreatic lymph node 1.2 cm in short axis, image 48 series 2. 1.1 cm right porta hepatis lymph node, image 45 series 2. Additional smaller porta hepatis lymph nodes are present.  Variant anatomy of the hepatic arteries, with the lateral segment left hepatic artery arising from a trifurcation of the celiac trunk, and the right hepatic artery and medial segment left hepatic artery arising from the proper hepatic artery.  With regard to vascular involvement by the tumor, there appears to be an intact fat plane between the SMA and the pancreatic head  hypodense mass. The gastroduodenal artery is near the margin of the mass with some indistinct fat planes but is not definitely surrounded by tumor. The tumor does appear to flatten the upper portion of the SMV on images 51-55 of series 2, without definite invasion of the vein.  Small retroperitoneal lymph nodes are not pathologically enlarged by size criteria.  Aortoiliac atherosclerotic vascular disease.   Reproductive: The mildly heterogeneous central zone of the prostate gland likely from mild benign prostatic hypertrophy.  Other: No supplemental non-categorized findings.  Musculoskeletal: Unremarkable  IMPRESSION: 1. 2.6 by 2.4 cm hypoenhancing mass in the pancreatic head associated with obstruction of the distal common bile duct and of the dorsal pancreatic duct. Appearance highly concerning for pancreatic adenocarcinoma. Tissue diagnosis recommended. 2. The mass minimally abuts the proximal SMV, without obvious invasion. Part of the mass is adjacent to the gastroduodenal artery. 3. Variant vascular anatomy of the liver in which the arterial supply to the lateral segment left hepatic lobe comes directly off the celiac trunk at a distal trifurcation. 4. Small but possibly malignant peripancreatic lymph nodes. 5. Gallbladder wall thickening, with suspected sludge in the gallbladder causing a higher than fluid density appearance. 6. No focal hepatic metastatic lesion is are identified.   Electronically Signed   By: Van Clines M.D.   On: 09/10/2014 12:01     Medications: Scheduled Meds: . aspirin  81 mg Oral Daily  . benztropine  0.5 mg Oral BID  . divalproex  500 mg Oral BID  . feeding supplement (ENSURE ENLIVE)  237 mL Oral BID BM  . folic acid  1 mg Oral Daily  . [START ON 09/29/2014] haloperidol decanoate  100 mg Intramuscular Q28 days  . heparin subcutaneous  5,000 Units Subcutaneous 3 times per day  . hydrALAZINE  10 mg Oral BID  . lactulose  20 g Oral BID  . levETIRAcetam  1,000 mg Oral BID  . lipase/protease/amylase  12,000 Units Oral TID AC  . multivitamin with minerals  1 tablet Oral Daily  . nicotine  21 mg Transdermal Daily  . polyethylene glycol  17 g Oral Daily  . QUEtiapine  50 mg Oral QHS  . sodium chloride  3 mL Intravenous Q12H  . thiamine  100 mg Oral Daily  . traZODone  100 mg Oral QHS  . vitamin B-12  500 mcg Oral Daily   Continuous Infusions: . sodium chloride 50  mL/hr at 09/11/14 2250   PRN Meds:.LORazepam, ondansetron **OR** ondansetron (ZOFRAN) IV, oxyCODONE    Assessment/Plan: 49 y.o. male with biliary obstruction, abnormal pancreas, Plavix stopped 2-3 days ago  He is currently scheduled for EUS/ERCP tomorrow. Will stop his [place] heparin after the PM dose tonight. Should plan for likely d/c tomorrow as long as all goes smoothly with the procedures. He lives in care facility in [place] I believe.     Chase Banister, MD  09/12/2014, 11:12 AM [facility] Pager [phone]

## 2014-09-13 NOTE — Transfer of Care (Signed)
Immediate Anesthesia Transfer of Care Note  Patient: Chase Blackwell  Procedure(s) Performed: Procedure(s): ENDOSCOPIC RETROGRADE CHOLANGIOPANCREATOGRAPHY (ERCP) (N/A) UPPER ESOPHAGEAL ENDOSCOPIC ULTRASOUND (EUS) (N/A)  Patient Location: PACU  Anesthesia Type:General  Level of Consciousness: awake, alert , oriented and patient cooperative  Airway & Oxygen Therapy: Patient Spontanous Breathing and Patient connected to face mask oxygen  Post-op Assessment: Report given to RN, Post -op Vital signs reviewed and stable and Patient moving all extremities  Post vital signs: Reviewed and stable  Last Vitals:  Filed Vitals:   09/13/14 1300  BP: 114/74  Pulse:   Temp: 36.8 C  Resp: 18    Complications: No apparent anesthesia complications

## 2014-09-13 NOTE — Interval H&P Note (Signed)
History and Physical Interval Note:  09/13/2014 10:13 AM  Chase Blackwell  has presented today for surgery, with the diagnosis of Mass at the pancreatic head. History of recurrent pancreatitis.  The various methods of treatment have been discussed with the patient and family. After consideration of risks, benefits and other options for treatment, the patient has consented to  Procedure(s): ENDOSCOPIC RETROGRADE CHOLANGIOPANCREATOGRAPHY (ERCP) (N/A) UPPER ESOPHAGEAL ENDOSCOPIC ULTRASOUND (EUS) (N/A) as a surgical intervention .  The patient's history has been reviewed, patient examined, no change in status, stable for surgery.  I have reviewed the patient's chart and labs.  Questions were answered to the patient's satisfaction.     Milus Banister

## 2014-09-13 NOTE — Discharge Summary (Signed)
Physician Discharge Summary  Chase Blackwell GNS:[id number] DOB: [birth year] DOA: 09/09/2014  PCP: Chase Grippe, MD  Admit date: 09/09/2014 Discharge date: 09/14/2014  Time spent:45 minutes  Recommendations for Outpatient Follow-up:  1. 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 male with prior history of hypercholesterolemia pancreatitis HTN GERD schizophrenia ETOH abuse Seizure disorder presented with abdominal pain, which was ongoing for 2 weeks Patient was supposed to have possible EUS/ERCP however has not had this scheduled that he is aware of. He now presents to the ED with pain again. He has had a increase in his lipase and a CT scan done 8/27 which shows increased dilatation of the common ducts and ? Pancreatic mass  Hospital Course:  Pancreatic mass, noted on CT  - with elevated LFTs, abdominal pain, with CBD and pancreatic duct dilation  - GI following, underwent ERCP with stent placement and EUS and FNA on 9/8, procedure was delayed due to Plavix which was held for 5days - per Dr.Jacobs, the mass-like region was sampled with FNA, preliminary cytology is + for neoplastic cells - LFTS improving - CA 19-9 35 - made FU with Dr.Shadad on Tuesday 9/13 at  10:30am, please FU final pathology   Hyperlipidemia -held statin  Chronic alcoholic pancreatitis - Continue with pancrealipase  Abnormal LFTS -related to alcoholic liver disease, mainly significant elevation of Alk Phos which is indicative of billiary obstruction from #1 -Elevated ammonia level, started on lactulose  Hypertension - Blood pressure on the lower side, resume metoprolol  Seizures - Continue with Depakote and Keppra  Chronic diastolic CHF - Appears to be euvolemic, Lasix was on hold, can be resumed now  Schizophrenia - Continue with Seroquel, continue with Tylenol as needed - Patient does not have ability to make decisions for herself as previously evaluated by psychiatry -Brother Chase Blackwell makes decisions per patient  Coronary artery disease - on aspirin, held Plavix for EUS/ERCP - stable, resume plavix at discharge  Procedures: EUS 9/9 EUS findings: 1. The pancreatic parenchyma was diffusely abnormal throughout the gland (lobular, more hypoechoic than usual, hyperchoic stands and foci). The parenchyma in the region of the head of pancreas was somewhat vaguely masslike for about 3.5cm but margins are very unclear, indiscrete. This region directly abuts the main portal vein for 1cm. This somewhat masslike area was sampled with 3 transduodenal passes with a 25 guage EUS FNA needle. 2. The CBD was dilated into the head of pancreas, into the region of masslike parenchyma (up to 1.7cm). 3. Main pancreatic duct was dilated throughout (45mm in head, 64mm in body and dail).  ERCP with stent placement: 9/9 1cm long mid CBD stricture, stented with 15mm diameter 6cm long fully covered (removable) metal mesh stent.  Consultations:  GI  Psychiatry  Discharge Exam: Filed Vitals:   09/13/14 1533  BP: 142/88  Pulse: 105  Temp:   Resp:     General: AAOx3 Cardiovascular: S1S2/RRR Respiratory: CTAB  Discharge Instructions    Current Discharge  Medication List    START taking these medications   Details  nicotine (NICODERM CQ - DOSED IN MG/24 HOURS) 21 mg/24hr patch Place 1 patch (21 mg total) onto the skin daily. Qty: 28 patch, Refills: 0      CONTINUE these medications which have CHANGED   Details  divalproex (DEPAKOTE) 500 MG DR tablet Take 1 tablet (500 mg total) by mouth 2 (two) times daily.      CONTINUE these medications which have NOT CHANGED   Details  aspirin 81 MG chewable tablet Chew 1 tablet (81 mg total) by mouth daily. Qty: 30 tablet, Refills: 1    benztropine (COGENTIN) 0.5 MG tablet Take 1 tablet (0.5 mg total) by mouth 2 (two) times daily. Qty: 60 tablet, Refills: 0    clopidogrel (PLAVIX) 75 MG tablet Take 75 mg by mouth daily.    feeding supplement, ENSURE ENLIVE, (ENSURE ENLIVE) LIQD Take 237 mLs by mouth 2 (two) times daily between meals. Qty: 237 mL, Refills: 12    folic acid (FOLVITE) 1 MG tablet Take 1 tablet (1 mg total) by mouth daily.    furosemide (LASIX) 40 MG tablet Take 40 mg by mouth daily.    haloperidol decanoate (HALDOL DECANOATE) 100 MG/ML injection Inject 1 mL (100 mg total) into the muscle every 28 (twenty-eight) days. Qty: 1 mL, Refills: 0    hydrALAZINE (APRESOLINE) 10 MG tablet Take 1 tablet (10 mg total) by mouth 2 (two) times daily. Qty: 60 tablet, Refills: 0    levETIRAcetam (KEPPRA) 1000 MG tablet Take 1 tablet (1,000 mg total) by mouth 2 (two) times daily. Qty: 60 tablet, Refills: 0    LORazepam (ATIVAN) 0.5 MG tablet TAKE 1 TABLET BY MOUTH EVERY EVENING AS NEEDED Refills: 2    metoprolol tartrate (LOPRESSOR) 25 MG tablet Take 0.5 tablets (12.5 mg total) by mouth 2 (two) times daily. Qty: 30 tablet, Refills: 0    oxyCODONE (OXY IR/ROXICODONE) 5 MG immediate release tablet one every four hours prn Refills: 0    Pancrelipase, Lip-Prot-Amyl, 24000 UNITS CPEP Two PO with meals (TID) and one PO with snacks (BID). Total of 8 daily. Qty: 240 capsule, Refills: 3     polyethylene glycol (MIRALAX / GLYCOLAX) packet Take 17 g by mouth daily.    QUEtiapine (SEROQUEL) 50 MG tablet Take 1 tablet (50 mg total) by mouth at bedtime. Qty: 30 tablet, Refills: 0    thiamine 100 MG tablet Take 1 tablet (100 mg total) by mouth daily.    traZODone (DESYREL) 100 MG tablet Take 1 tablet (100 mg total) by mouth at bedtime. Qty: 30 tablet, Refills: 0    vitamin B-12 500 MCG tablet Take 1 tablet (500 mcg total) by mouth daily. Qty: 30 tablet, Refills: 1    atorvastatin (LIPITOR) 80 MG tablet Take 1 tablet (80 mg total) by mouth daily. Qty: 30 tablet, Refills: 0      STOP taking these medications     lisinopril (PRINIVIL,ZESTRIL) 2.5 MG tablet      meloxicam (MOBIC) 15 MG tablet        Allergies  Allergen Reactions  . Hctz [Hydrochlorothiazide] Other (See Comments)    Dizzy spells  . Hydroxyzine Hives  . Sulfonamide Derivatives Hives  .  Cetirizine & Related Other (See Comments)    Brothers were not aware of this allergy      The results of significant diagnostics from this hospitalization (including imaging, microbiology, ancillary and laboratory) are listed below for reference.    Significant Diagnostic Studies: Ct Abdomen Pelvis W Wo Contrast  09/10/2014   CLINICAL DATA:  Progressive biliary dilatation, with hypodense pancreatic lesion concerning for possible malignancy on prior CT.  EXAM: CT ABDOMEN AND PELVIS WITHOUT AND WITH CONTRAST  TECHNIQUE: Multidetector CT imaging of the abdomen and pelvis was performed following the standard protocol before and following the bolus administration of intravenous contrast.  CONTRAST:  173mL OMNIPAQUE IOHEXOL 350 MG/ML SOLN  COMPARISON:  08/31/2014; 07/24/2014  FINDINGS: Lower chest: Posterior basal segment subsegmental atelectasis bilaterally.  Hepatobiliary: Considerable intrahepatic and extrahepatic biliary dilatation, new compared to 01/12/14, extrahepatic bile duct measuring up to 2 cm diameter. Fairly abrupt  tapering of the common bile duct in the vicinity of the pancreatic head where there is some faint enhancement along the distal visualize margin of the CBD. This tapering seems to be about a cm proximal to the expected location of the ampulla.  Mild wall thickening of the gallbladder, with mildly complex fluid within the gallbladder.  Pancreas: Dilated and somewhat beaded dorsal pancreatic duct. This terminates in the vicinity of the pancreatic head where there appears to be a 2.6 by 2.4 cm hypoenhancing mass on the arterial phase images. When I compared back to 01/12/2014, this mass appears to be new.  Spleen: Unremarkable  Adrenals/Urinary Tract: Peripheral scarring of the right kidney lower pole.  Stomach/Bowel: Prominent stool throughout the colon favors constipation. Scattered mildly dilated loops of upper abdominal small bowel. Appendix normal.  Vascular/Lymphatic: Upper peripancreatic lymph node 1.2 cm in short axis, image 48 series 2. 1.1 cm right porta hepatis lymph node, image 45 series 2. Additional smaller porta hepatis lymph nodes are present.  Variant anatomy of the hepatic arteries, with the lateral segment left hepatic artery arising from a trifurcation of the celiac trunk, and the right hepatic artery and medial segment left hepatic artery arising from the proper hepatic artery.  With regard to vascular involvement by the tumor, there appears to be an intact fat plane between the SMA and the pancreatic head hypodense mass. The gastroduodenal artery is near the margin of the mass with some indistinct fat planes but is not definitely surrounded by tumor. The tumor does appear to flatten the upper portion of the SMV on images 51-55 of series 2, without definite invasion of the vein.  Small retroperitoneal lymph nodes are not pathologically enlarged by size criteria.  Aortoiliac atherosclerotic vascular disease.  Reproductive: The mildly heterogeneous central zone of the prostate gland likely from mild  benign prostatic hypertrophy.  Other: No supplemental non-categorized findings.  Musculoskeletal: Unremarkable  IMPRESSION: 1. 2.6 by 2.4 cm hypoenhancing mass in the pancreatic head associated with obstruction of the distal common bile duct and of the dorsal pancreatic duct. Appearance highly concerning for pancreatic adenocarcinoma. Tissue diagnosis recommended. 2. The mass minimally abuts the proximal SMV, without obvious invasion. Part of the mass is adjacent to the gastroduodenal artery. 3. Variant vascular anatomy of the liver in which the arterial supply to the lateral segment left hepatic lobe comes directly off the celiac trunk at a distal trifurcation. 4. Small but possibly malignant peripancreatic lymph nodes. 5. Gallbladder wall thickening, with suspected sludge in the gallbladder causing a higher than fluid density appearance. 6. No focal hepatic metastatic  lesion is are identified.   Electronically Signed   By: Van Clines M.D.   On: 09/10/2014 12:01   Ct Abdomen Pelvis W Contrast  08/31/2014   CLINICAL DATA:  Upper abdominal pain. Increase bilirubin. Recent hospitalization for pancreatitis.  EXAM: CT ABDOMEN AND PELVIS WITH CONTRAST  TECHNIQUE: Multidetector CT imaging of the abdomen and pelvis was performed using the standard protocol following bolus administration of intravenous contrast.  CONTRAST:  166mL OMNIPAQUE IOHEXOL 300 MG/ML  SOLN  COMPARISON:  08/03/2014.  MRI 07/24/2014  FINDINGS: Lung bases are clear. No effusions. Heart is normal size. Coronary artery calcifications noted in the visualized right coronary artery.  There is worsening biliary ductal dilatation, both intrahepatic and extrahepatic. Gallbladder is distended, similar to prior study. Pancreatic duct is dilated. Area of low density within the pancreatic head. This could reflect edema related pancreatitis, but it is difficult to completely exclude a pancreatic head lesion given the degree of biliary and pancreatic  ductal dilatation.  Large stool burden throughout the colon. Views gaseous distention of bowel may reflect mild ileus. Aorta and iliac vessels are calcified, non aneurysmal.  Spleen, adrenals and kidneys are unremarkable. No free fluid, free air or adenopathy.  No acute bony abnormality or focal bone lesion.  IMPRESSION: Increasing biliary ductal dilatation within the liver and in the common bile duct. Continued pancreatic ductal dilatation. There is an area of ill-defined low-density in the pancreatic head which could reflect edema related to acute pancreatitis, but I cannot exclude pancreatic head lesion. After acute symptoms resolve, MRI of the abdomen would be beneficial to further evaluate. Followup MRI was also recommended on prior MRI of the abdomen.  Large stool burden throughout the colon. Mild diffuse gaseous distention of bowel may reflect ileus.  Right coronary artery calcifications.   Electronically Signed   By: Rolm Baptise M.D.   On: 08/31/2014 21:56   Dg Ercp Biliary & Pancreatic Ducts  09/13/2014   CLINICAL DATA:  Obstructive jaundice  EXAM: ERCP  TECHNIQUE: Multiple spot images obtained with the fluoroscopic device and submitted for interpretation post-procedure.  FLUOROSCOPY TIME:  2 minutes 12 seconds  COMPARISON:  CT 09/10/2014  FINDINGS: Six fluoroscopic spot images from ERCP document endoscopic catheterization of the CBD and opacification. The intrahepatic bile ducts are incompletely opacified, dilated centrally. The proximal CBD is dilated. There is a high-grade stenosis in the distal CBD. Final image documents placement of a metallic stent across the distal CBD. The midportion of the stent remains partially constrained.  IMPRESSION: 1. Distal CBD obstruction, with endoscopic metallic stent placement. These images were submitted for radiologic interpretation only. Please see the procedural report for the amount of contrast and the fluoroscopy time utilized.   Electronically Signed   By: Lucrezia Europe M.D.   On: 09/13/2014 13:04    Microbiology: Recent Results (from the past 240 hour(s))  Surgical pcr screen     Status: None   Collection Time: 09/13/14  7:40 AM  Result Value Ref Range Status   MRSA, PCR NEGATIVE NEGATIVE Final   Staphylococcus aureus NEGATIVE NEGATIVE Final    Comment:        The Xpert SA Assay (FDA approved for NASAL specimens in patients over 47 years of age), is one component of a comprehensive surveillance program.  Test performance has been validated by [facility] for patients greater than or equal to 27 year old. It is not intended to diagnose infection nor to guide or monitor treatment.  Labs: Basic Metabolic Panel:  Recent Labs Lab 09/09/14 2153 09/10/14 0518 09/11/14 0548 09/12/14 0547  NA 129* 132* 133* 135  K 4.6 4.5 4.1 4.1  CL 93* 100* 100* 101  CO2 $Re'27 24 25 23  'IVc$ GLUCOSE 109* 77 77 76  BUN $Re'19 18 11 6  'wuD$ CREATININE 0.93 0.76 0.67 0.66  CALCIUM 9.0 8.8* 8.7* 8.7*   Liver Function Tests:  Recent Labs Lab 09/09/14 2153 09/10/14 0518 09/11/14 0548 09/12/14 0547  AST 66* 67* 53* 45*  ALT 75* 71* 63 61  ALKPHOS 859* 819* 736* 754*  BILITOT 7.7* 7.1* 7.2* 7.4*  PROT 6.6 5.9* 6.2* 6.5  ALBUMIN 3.0* 2.7* 2.7* 2.8*    Recent Labs Lab 09/09/14 2153  LIPASE 62*    Recent Labs Lab 09/10/14 0714 09/11/14 0548  AMMONIA 44* 65*   CBC:  Recent Labs Lab 09/09/14 2153 09/10/14 0518 09/11/14 0548 09/12/14 0547  WBC 8.9 7.2 6.0 4.7  NEUTROABS 5.5  --   --   --   HGB 10.6* 10.1* 9.9* 9.9*  HCT 30.8* 29.9* 29.3* 29.5*  MCV 90.1 90.3 91.0 91.6  PLT 370 335 314 299   Cardiac Enzymes: No results for input(s): CKTOTAL, CKMB, CKMBINDEX, TROPONINI in the last 168 hours. BNP: BNP (last 3 results)  Recent Labs  05/01/14 2153 06/06/14 2206  BNP 21.0 28.5    ProBNP (last 3 results) No results for input(s): PROBNP in the last 8760 hours.  CBG:  Recent Labs Lab 09/10/14 0807 09/11/14 0739 09/12/14 0743  09/13/14 0739  GLUCAP 79 72 76 83       Signed:  Cyanna Neace  [facility] 09/13/2014, 4:44 PM

## 2014-09-14 DIAGNOSIS — K8689 Other specified diseases of pancreas: Secondary | ICD-10-CM | POA: Diagnosis present

## 2014-09-14 LAB — COMPREHENSIVE METABOLIC PANEL
ALT: 63 U/L (ref 17–63)
AST: 48 U/L — AB (ref 15–41)
Albumin: 2.9 g/dL — ABNORMAL LOW (ref 3.5–5.0)
Alkaline Phosphatase: 746 U/L — ABNORMAL HIGH (ref 38–126)
Anion gap: 10 (ref 5–15)
CHLORIDE: 100 mmol/L — AB (ref 101–111)
CO2: 27 mmol/L (ref 22–32)
CREATININE: 0.6 mg/dL — AB (ref 0.61–1.24)
Calcium: 9.4 mg/dL (ref 8.9–10.3)
GFR calc Af Amer: >60 mL/min (ref >60)
Glucose, Bld: 107 mg/dL — ABNORMAL HIGH (ref 65–99)
Potassium: 3.7 mmol/L (ref 3.5–5.1)
Sodium: 137 mmol/L (ref 135–145)
Total Bilirubin: 5.5 mg/dL — ABNORMAL HIGH (ref 0.3–1.2)
Total Protein: 6.7 g/dL (ref 6.5–8.1)

## 2014-09-14 LAB — CBC
HCT: 29.5 % — ABNORMAL LOW (ref 39.0–52.0)
Hemoglobin: 10.3 g/dL — ABNORMAL LOW (ref 13.0–17.0)
MCH: 32 pg (ref 26.0–34.0)
MCHC: 34.9 g/dL (ref 30.0–36.0)
MCV: 91.6 fL (ref 78.0–100.0)
PLATELETS: 278 10*3/uL (ref 150–400)
RBC: 3.22 MIL/uL — ABNORMAL LOW (ref 4.22–5.81)
RDW: 15.2 % (ref 11.5–15.5)
WBC: 7.6 10*3/uL (ref 4.0–10.5)

## 2014-09-14 LAB — GLUCOSE, CAPILLARY: Glucose-Capillary: 102 mg/dL — ABNORMAL HIGH (ref 65–99)

## 2014-09-14 MED ORDER — LORAZEPAM 0.5 MG PO TABS: ORAL_TABLET | ORAL | Status: DC

## 2014-09-14 MED ORDER — OXYCODONE HCL 5 MG PO TABS: 5.0000 mg | ORAL_TABLET | Freq: Four times a day (QID) | ORAL | Status: DC | PRN

## 2014-09-14 NOTE — Discharge Summary (Signed)
Physician Discharge Summary  Chase Blackwell PVV:[id number] DOB: 1965-08-06 DOA: 09/09/2014  PCP: [name], MD  Admit date: 09/09/2014 Discharge date: 09/14/2014  Time spent:45 minutes  Recommendations for Outpatient Follow-up:  1. 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 male with prior history of hypercholesterolemia pancreatitis HTN GERD schizophrenia ETOH abuse Seizure disorder presented with abdominal pain, which was ongoing for 2 weeks Patient was supposed to have possible EUS/ERCP however has not had this scheduled that he is aware of. He now presents to the ED with pain again. He has had a increase in his lipase and a CT scan done 8/27 which shows increased dilatation of the common ducts and ? Pancreatic mass  Hospital Course:  Pancreatic mass, noted on CT  - with elevated LFTs, abdominal pain, with CBD and pancreatic duct dilation  - GI following, underwent ERCP with stent placement and EUS and FNA on 9/8, procedure was delayed due to Plavix which was held for 5days - per Dr.Jacobs, the mass-like region was sampled with FNA, preliminary cytology is + for neoplastic cells - LFTS improving - CA 19-9 35 - made FU with Dr.Shadad on Tuesday 9/13 at  10:30am, please FU final pathology  -plavix resumed  Hyperlipidemia -held statin  Chronic alcoholic pancreatitis - Continue with pancrealipase  Abnormal LFTS -related to alcoholic liver disease, mainly significant elevation of Alk Phos which is indicative of billiary obstruction from #1 -Elevated ammonia level, started on lactulose  Hypertension - Blood pressure was on the lower side, resumed metoprolol, Lisinopril held  Seizures - Continue with Depakote and Keppra  Chronic diastolic CHF - Appears to be euvolemic, Lasix was on hold, can be resumed now  Schizophrenia - Continue with Seroquel, continue with Tylenol as needed - Patient does not have ability to make decisions for herself as previously evaluated by psychiatry -Brother Kasandra Knudsen makes decisions per patient  Coronary artery disease - on aspirin, held Plavix for EUS/ERCP - stable, resume plavix at discharge  Procedures: EUS 9/9 EUS findings: 1. The pancreatic parenchyma was diffusely abnormal throughout the gland (lobular, more hypoechoic than usual, hyperchoic stands and foci). The parenchyma in the region of the head of pancreas was somewhat vaguely masslike for about 3.5cm but margins are very unclear, indiscrete. This region directly abuts the main portal vein for 1cm. This somewhat masslike area was sampled with 3 transduodenal passes with a 25 guage EUS FNA needle. 2. The CBD was dilated into the head of pancreas, into the region of masslike parenchyma (up to 1.7cm). 3. Main pancreatic duct was dilated throughout (73mm in head, 23mm in body and dail).  ERCP with stent placement: 9/9 1cm long mid CBD stricture, stented with 26mm diameter 6cm long fully covered (  removable) metal mesh stent.    Consultations:  GI  Psychiatry  Discharge Exam: Filed Vitals:   09/14/14 0830  BP: 140/83  Pulse: 101  Temp: 98 F (36.7 C)  Resp: 18    General: AAOx3 Cardiovascular: S1S2/RRR Respiratory:  CTAB  Discharge Instructions   Discharge Instructions    Diet - low sodium heart healthy    Complete by:  As directed      Increase activity slowly    Complete by:  As directed           Current Discharge Medication List    START taking these medications   Details  nicotine (NICODERM CQ - DOSED IN MG/24 HOURS) 21 mg/24hr patch Place 1 patch (21 mg total) onto the skin daily. Qty: 28 patch, Refills: 0      CONTINUE these medications which have CHANGED   Details  divalproex (DEPAKOTE) 500 MG DR tablet Take 1 tablet (500 mg total) by mouth 2 (two) times daily.    LORazepam (ATIVAN) 0.5 MG tablet TAKE 1 TABLET BY MOUTH EVERY EVENING AS NEEDED Qty: 20 tablet, Refills: 0    oxyCODONE (OXY IR/ROXICODONE) 5 MG immediate release tablet Take 1 tablet (5 mg total) by mouth every 6 (six) hours as needed for moderate pain or severe pain. Qty: 30 tablet, Refills: 0      CONTINUE these medications which have NOT CHANGED   Details  aspirin 81 MG chewable tablet Chew 1 tablet (81 mg total) by mouth daily. Qty: 30 tablet, Refills: 1    benztropine (COGENTIN) 0.5 MG tablet Take 1 tablet (0.5 mg total) by mouth 2 (two) times daily. Qty: 60 tablet, Refills: 0    clopidogrel (PLAVIX) 75 MG tablet Take 75 mg by mouth daily.    feeding supplement, ENSURE ENLIVE, (ENSURE ENLIVE) LIQD Take 237 mLs by mouth 2 (two) times daily between meals. Qty: 237 mL, Refills: 12    folic acid (FOLVITE) 1 MG tablet Take 1 tablet (1 mg total) by mouth daily.    furosemide (LASIX) 40 MG tablet Take 40 mg by mouth daily.    haloperidol decanoate (HALDOL DECANOATE) 100 MG/ML injection Inject 1 mL (100 mg total) into the muscle every 28 (twenty-eight) days. Qty: 1 mL, Refills: 0    hydrALAZINE (APRESOLINE) 10 MG tablet Take 1 tablet (10 mg total) by mouth 2 (two) times daily. Qty: 60 tablet, Refills: 0    levETIRAcetam (KEPPRA) 1000 MG tablet Take 1 tablet (1,000 mg total) by mouth 2 (two) times  daily. Qty: 60 tablet, Refills: 0    metoprolol tartrate (LOPRESSOR) 25 MG tablet Take 0.5 tablets (12.5 mg total) by mouth 2 (two) times daily. Qty: 30 tablet, Refills: 0    Pancrelipase, Lip-Prot-Amyl, 24000 UNITS CPEP Two PO with meals (TID) and one PO with snacks (BID). Total of 8 daily. Qty: 240 capsule, Refills: 3    polyethylene glycol (MIRALAX / GLYCOLAX) packet Take 17 g by mouth daily.    QUEtiapine (SEROQUEL) 50 MG tablet Take 1 tablet (50 mg total) by mouth at bedtime. Qty: 30 tablet, Refills: 0    thiamine 100 MG tablet Take 1 tablet (100 mg total) by mouth daily.    traZODone (DESYREL) 100 MG tablet Take 1 tablet (100 mg total) by mouth at bedtime. Qty: 30 tablet, Refills: 0    vitamin B-12 500 MCG tablet Take 1 tablet (500 mcg total) by mouth daily. Qty: 30 tablet, Refills: 1    atorvastatin (LIPITOR) 80 MG tablet  Take 1 tablet (80 mg total) by mouth daily. Qty: 30 tablet, Refills: 0      STOP taking these medications     lisinopril (PRINIVIL,ZESTRIL) 2.5 MG tablet      meloxicam (MOBIC) 15 MG tablet        Allergies  Allergen Reactions  . Hctz [Hydrochlorothiazide] Other (See Comments)    Dizzy spells  . Hydroxyzine Hives  . Sulfonamide Derivatives Hives  . Cetirizine & Related Other (See Comments)    Brothers were not aware of this allergy      The results of significant diagnostics from this hospitalization (including imaging, microbiology, ancillary and laboratory) are listed below for reference.    Significant Diagnostic Studies: Ct Abdomen Pelvis W Wo Contrast  09/10/2014   CLINICAL DATA:  Progressive biliary dilatation, with hypodense pancreatic lesion concerning for possible malignancy on prior CT.  EXAM: CT ABDOMEN AND PELVIS WITHOUT AND WITH CONTRAST  TECHNIQUE: Multidetector CT imaging of the abdomen and pelvis was performed following the standard protocol before and following the bolus administration of intravenous contrast.  CONTRAST:   187mL OMNIPAQUE IOHEXOL 350 MG/ML SOLN  COMPARISON:  08/31/2014; 07/24/2014  FINDINGS: Lower chest: Posterior basal segment subsegmental atelectasis bilaterally.  Hepatobiliary: Considerable intrahepatic and extrahepatic biliary dilatation, new compared to 01/12/14, extrahepatic bile duct measuring up to 2 cm diameter. Fairly abrupt tapering of the common bile duct in the vicinity of the pancreatic head where there is some faint enhancement along the distal visualize margin of the CBD. This tapering seems to be about a cm proximal to the expected location of the ampulla.  Mild wall thickening of the gallbladder, with mildly complex fluid within the gallbladder.  Pancreas: Dilated and somewhat beaded dorsal pancreatic duct. This terminates in the vicinity of the pancreatic head where there appears to be a 2.6 by 2.4 cm hypoenhancing mass on the arterial phase images. When I compared back to 01/12/2014, this mass appears to be new.  Spleen: Unremarkable  Adrenals/Urinary Tract: Peripheral scarring of the right kidney lower pole.  Stomach/Bowel: Prominent stool throughout the colon favors constipation. Scattered mildly dilated loops of upper abdominal small bowel. Appendix normal.  Vascular/Lymphatic: Upper peripancreatic lymph node 1.2 cm in short axis, image 48 series 2. 1.1 cm right porta hepatis lymph node, image 45 series 2. Additional smaller porta hepatis lymph nodes are present.  Variant anatomy of the hepatic arteries, with the lateral segment left hepatic artery arising from a trifurcation of the celiac trunk, and the right hepatic artery and medial segment left hepatic artery arising from the proper hepatic artery.  With regard to vascular involvement by the tumor, there appears to be an intact fat plane between the SMA and the pancreatic head hypodense mass. The gastroduodenal artery is near the margin of the mass with some indistinct fat planes but is not definitely surrounded by tumor. The tumor does appear  to flatten the upper portion of the SMV on images 51-55 of series 2, without definite invasion of the vein.  Small retroperitoneal lymph nodes are not pathologically enlarged by size criteria.  Aortoiliac atherosclerotic vascular disease.  Reproductive: The mildly heterogeneous central zone of the prostate gland likely from mild benign prostatic hypertrophy.  Other: No supplemental non-categorized findings.  Musculoskeletal: Unremarkable  IMPRESSION: 1. 2.6 by 2.4 cm hypoenhancing mass in the pancreatic head associated with obstruction of the distal common bile duct and of the dorsal pancreatic duct. Appearance highly concerning for pancreatic adenocarcinoma. Tissue diagnosis recommended. 2. The mass minimally  abuts the proximal SMV, without obvious invasion. Part of the mass is adjacent to the gastroduodenal artery. 3. Variant vascular anatomy of the liver in which the arterial supply to the lateral segment left hepatic lobe comes directly off the celiac trunk at a distal trifurcation. 4. Small but possibly malignant peripancreatic lymph nodes. 5. Gallbladder wall thickening, with suspected sludge in the gallbladder causing a higher than fluid density appearance. 6. No focal hepatic metastatic lesion is are identified.   Electronically Signed   By: Van Clines M.D.   On: 09/10/2014 12:01   Ct Abdomen Pelvis W Contrast  08/31/2014   CLINICAL DATA:  Upper abdominal pain. Increase bilirubin. Recent hospitalization for pancreatitis.  EXAM: CT ABDOMEN AND PELVIS WITH CONTRAST  TECHNIQUE: Multidetector CT imaging of the abdomen and pelvis was performed using the standard protocol following bolus administration of intravenous contrast.  CONTRAST:  115mL OMNIPAQUE IOHEXOL 300 MG/ML  SOLN  COMPARISON:  08/03/2014.  MRI 07/24/2014  FINDINGS: Lung bases are clear. No effusions. Heart is normal size. Coronary artery calcifications noted in the visualized right coronary artery.  There is worsening biliary ductal  dilatation, both intrahepatic and extrahepatic. Gallbladder is distended, similar to prior study. Pancreatic duct is dilated. Area of low density within the pancreatic head. This could reflect edema related pancreatitis, but it is difficult to completely exclude a pancreatic head lesion given the degree of biliary and pancreatic ductal dilatation.  Large stool burden throughout the colon. Views gaseous distention of bowel may reflect mild ileus. Aorta and iliac vessels are calcified, non aneurysmal.  Spleen, adrenals and kidneys are unremarkable. No free fluid, free air or adenopathy.  No acute bony abnormality or focal bone lesion.  IMPRESSION: Increasing biliary ductal dilatation within the liver and in the common bile duct. Continued pancreatic ductal dilatation. There is an area of ill-defined low-density in the pancreatic head which could reflect edema related to acute pancreatitis, but I cannot exclude pancreatic head lesion. After acute symptoms resolve, MRI of the abdomen would be beneficial to further evaluate. Followup MRI was also recommended on prior MRI of the abdomen.  Large stool burden throughout the colon. Mild diffuse gaseous distention of bowel may reflect ileus.  Right coronary artery calcifications.   Electronically Signed   By: Rolm Baptise M.D.   On: 08/31/2014 21:56   Dg Ercp Biliary & Pancreatic Ducts  09/13/2014   CLINICAL DATA:  Obstructive jaundice  EXAM: ERCP  TECHNIQUE: Multiple spot images obtained with the fluoroscopic device and submitted for interpretation post-procedure.  FLUOROSCOPY TIME:  2 minutes 12 seconds  COMPARISON:  CT 09/10/2014  FINDINGS: Six fluoroscopic spot images from ERCP document endoscopic catheterization of the CBD and opacification. The intrahepatic bile ducts are incompletely opacified, dilated centrally. The proximal CBD is dilated. There is a high-grade stenosis in the distal CBD. Final image documents placement of a metallic stent across the distal CBD.  The midportion of the stent remains partially constrained.  IMPRESSION: 1. Distal CBD obstruction, with endoscopic metallic stent placement. These images were submitted for radiologic interpretation only. Please see the procedural report for the amount of contrast and the fluoroscopy time utilized.   Electronically Signed   By: Lucrezia Europe M.D.   On: 09/13/2014 13:04    Microbiology: Recent Results (from the past 240 hour(s))  Surgical pcr screen     Status: None   Collection Time: 09/13/14  7:40 AM  Result Value Ref Range Status   MRSA, PCR NEGATIVE NEGATIVE Final  Staphylococcus aureus NEGATIVE NEGATIVE Final    Comment:        The Xpert SA Assay (FDA approved for NASAL specimens in patients over 19 years of age), is one component of a comprehensive surveillance program.  Test performance has been validated by [facility] for patients greater than or equal to 66 year old. It is not intended to diagnose infection nor to guide or monitor treatment.      Labs: Basic Metabolic Panel:  Recent Labs Lab 09/09/14 2153 09/10/14 0518 09/11/14 0548 09/12/14 0547 09/14/14 0533  NA 129* 132* 133* 135 137  K 4.6 4.5 4.1 4.1 3.7  CL 93* 100* 100* 101 100*  CO2 $Re'27 24 25 23 27  'pBz$ GLUCOSE 109* 77 77 76 107*  BUN $Re'19 18 11 6 'ygV$ <5*  CREATININE 0.93 0.76 0.67 0.66 0.60*  CALCIUM 9.0 8.8* 8.7* 8.7* 9.4   Liver Function Tests:  Recent Labs Lab 09/09/14 2153 09/10/14 0518 09/11/14 0548 09/12/14 0547 09/14/14 0533  AST 66* 67* 53* 45* 48*  ALT 75* 71* 63 61 63  ALKPHOS 859* 819* 736* 754* 746*  BILITOT 7.7* 7.1* 7.2* 7.4* 5.5*  PROT 6.6 5.9* 6.2* 6.5 6.7  ALBUMIN 3.0* 2.7* 2.7* 2.8* 2.9*    Recent Labs Lab 09/09/14 2153  LIPASE 62*    Recent Labs Lab 09/10/14 0714 09/11/14 0548  AMMONIA 44* 65*   CBC:  Recent Labs Lab 09/09/14 2153 09/10/14 0518 09/11/14 0548 09/12/14 0547 09/14/14 0533  WBC 8.9 7.2 6.0 4.7 7.6  NEUTROABS 5.5  --   --   --   --   HGB 10.6*  10.1* 9.9* 9.9* 10.3*  HCT 30.8* 29.9* 29.3* 29.5* 29.5*  MCV 90.1 90.3 91.0 91.6 91.6  PLT 370 335 314 299 278   Cardiac Enzymes: No results for input(s): CKTOTAL, CKMB, CKMBINDEX, TROPONINI in the last 168 hours. BNP: BNP (last 3 results)  Recent Labs  05/01/14 2153 06/06/14 2206  BNP 21.0 28.5    ProBNP (last 3 results) No results for input(s): PROBNP in the last 8760 hours.  CBG:  Recent Labs Lab 09/10/14 0807 09/11/14 0739 09/12/14 0743 09/13/14 0739 09/14/14 0828  GLUCAP 79 72 76 83 102*       Signed:  Zaria Taha  [facility] 09/14/2014, 9:36 AM

## 2014-09-14 NOTE — Progress Notes (Signed)
CSW (Clinical Education officer, museum) received call back from [facility] they mistakenly made bed offer and they cannot accept pt today. CSW notified pt and pt brother that plan will be for pt to return to [facility]. Facility made aware and confirmed they can accept pt. CSW faxed dc summary and updated pt medical necessity form. Pt brother requesting transport be arrange at 3pm so pt mother can visit before discharges. CSW confirmed this was okay and notified nursing. CSW signing off.  [place], LCSWA Weekend CSW [phone]

## 2014-09-14 NOTE — Progress Notes (Signed)
Chase Blackwell to be D/C'd Nursing Home per MD order. Called report to facility RN. Pt belongings addressed to facility transported with pt.    Medication List    STOP taking these medications        lisinopril 2.5 MG tablet  Commonly known as:  PRINIVIL,ZESTRIL     meloxicam 15 MG tablet  Commonly known as:  MOBIC      TAKE these medications        aspirin 81 MG chewable tablet  Chew 1 tablet (81 mg total) by mouth daily.     atorvastatin 80 MG tablet  Commonly known as:  LIPITOR  Take 1 tablet (80 mg total) by mouth daily.     benztropine 0.5 MG tablet  Commonly known as:  COGENTIN  Take 1 tablet (0.5 mg total) by mouth 2 (two) times daily.     clopidogrel 75 MG tablet  Commonly known as:  PLAVIX  Take 75 mg by mouth daily.     cyanocobalamin 500 MCG tablet  Take 1 tablet (500 mcg total) by mouth daily.     divalproex 500 MG DR tablet  Commonly known as:  DEPAKOTE  Take 1 tablet (500 mg total) by mouth 2 (two) times daily.     feeding supplement (ENSURE ENLIVE) Liqd  Take 237 mLs by mouth 2 (two) times daily between meals.     folic acid 1 MG tablet  Commonly known as:  FOLVITE  Take 1 tablet (1 mg total) by mouth daily.     furosemide 40 MG tablet  Commonly known as:  LASIX  Take 40 mg by mouth daily.     haloperidol decanoate 100 MG/ML injection  Commonly known as:  HALDOL DECANOATE  Inject 1 mL (100 mg total) into the muscle every 28 (twenty-eight) days.     hydrALAZINE 10 MG tablet  Commonly known as:  APRESOLINE  Take 1 tablet (10 mg total) by mouth 2 (two) times daily.     levETIRAcetam 1000 MG tablet  Commonly known as:  KEPPRA  Take 1 tablet (1,000 mg total) by mouth 2 (two) times daily.     LORazepam 0.5 MG tablet  Commonly known as:  ATIVAN  TAKE 1 TABLET BY MOUTH EVERY EVENING AS NEEDED     metoprolol tartrate 25 MG tablet  Commonly known as:  LOPRESSOR  Take 0.5 tablets (12.5 mg total) by mouth 2 (two) times daily.     nicotine 21  mg/24hr patch  Commonly known as:  NICODERM CQ - dosed in mg/24 hours  Place 1 patch (21 mg total) onto the skin daily.     oxyCODONE 5 MG immediate release tablet  Commonly known as:  Oxy IR/ROXICODONE  Take 1 tablet (5 mg total) by mouth every 6 (six) hours as needed for moderate pain or severe pain.     Pancrelipase (Lip-Prot-Amyl) 24000 UNITS Cpep  Two PO with meals (TID) and one PO with snacks (BID). Total of 8 daily.     polyethylene glycol packet  Commonly known as:  MIRALAX / GLYCOLAX  Take 17 g by mouth daily.     QUEtiapine 50 MG tablet  Commonly known as:  SEROQUEL  Take 1 tablet (50 mg total) by mouth at bedtime.     thiamine 100 MG tablet  Take 1 tablet (100 mg total) by mouth daily.     traZODone 100 MG tablet  Commonly known as:  DESYREL  Take 1 tablet (100 mg total) by  mouth at bedtime.        Filed Vitals:   09/14/14 0830  BP: 140/83  Pulse: 101  Temp: 98 F (36.7 C)  Resp: 18    Skin clean, dry and intact without evidence of skin break down, no evidence of skin tears noted. IV catheter discontinued intact. Site without signs and symptoms of complications. Dressing and pressure applied. Pt denies pain at this time. No complaints noted.   Patient escorted via Chase Blackwell 09/14/2014 5:20 PM

## 2014-09-14 NOTE — Progress Notes (Addendum)
CSW (Clinical Education officer, museum) spoke with [facility] admissions and confirmed they will accept pt today. They will call CSW when they are available in office to do paperwork with the patient and CSW will arrange transport.  CSW (Clinical Education officer, museum) prepared pt dc packet and placed with shadow chart. CSW will arrange non-emergent ambulance transport when facility confirms time. Pt,pt nurse, and facility informed. Per pt request, CSW tried to contact pt brother. CSW unable to reach or leave voicemail. Will continue to try. CSW signing off.  ADDENDUM: CSW was able to reach pt brother. He will be available to do paperwork on pt behalf at facility. CSW signing off.  [place], LCSWA Weekend CSW [phone]

## 2014-09-16 ENCOUNTER — Emergency Department (HOSPITAL_COMMUNITY): Payer: Medicare Other

## 2014-09-16 ENCOUNTER — Encounter (HOSPITAL_COMMUNITY): Payer: Self-pay

## 2014-09-16 ENCOUNTER — Inpatient Hospital Stay (HOSPITAL_COMMUNITY)
Admission: EM | Admit: 2014-09-16 | Discharge: 2014-09-20 | DRG: 071 | Disposition: A | Discharge status: Skilled Nursing Facility | Payer: Medicare Other | Attending: Internal Medicine | Admitting: Internal Medicine

## 2014-09-16 DIAGNOSIS — I252 Old myocardial infarction: Secondary | ICD-10-CM

## 2014-09-16 DIAGNOSIS — R400 Somnolence: Secondary | ICD-10-CM

## 2014-09-16 DIAGNOSIS — F1010 Alcohol abuse, uncomplicated: Secondary | ICD-10-CM | POA: Diagnosis present

## 2014-09-16 DIAGNOSIS — G8929 Other chronic pain: Secondary | ICD-10-CM | POA: Diagnosis present

## 2014-09-16 DIAGNOSIS — J45909 Unspecified asthma, uncomplicated: Secondary | ICD-10-CM | POA: Diagnosis present

## 2014-09-16 DIAGNOSIS — D649 Anemia, unspecified: Secondary | ICD-10-CM | POA: Diagnosis present

## 2014-09-16 DIAGNOSIS — K869 Disease of pancreas, unspecified: Secondary | ICD-10-CM | POA: Diagnosis not present

## 2014-09-16 DIAGNOSIS — R17 Unspecified jaundice: Secondary | ICD-10-CM | POA: Diagnosis present

## 2014-09-16 DIAGNOSIS — Z681 Body mass index (BMI) 19 or less, adult: Secondary | ICD-10-CM

## 2014-09-16 DIAGNOSIS — Z7982 Long term (current) use of aspirin: Secondary | ICD-10-CM

## 2014-09-16 DIAGNOSIS — C259 Malignant neoplasm of pancreas, unspecified: Secondary | ICD-10-CM | POA: Diagnosis present

## 2014-09-16 DIAGNOSIS — Z8674 Personal history of sudden cardiac arrest: Secondary | ICD-10-CM | POA: Diagnosis not present

## 2014-09-16 DIAGNOSIS — F200 Paranoid schizophrenia: Secondary | ICD-10-CM | POA: Diagnosis present

## 2014-09-16 DIAGNOSIS — F1290 Cannabis use, unspecified, uncomplicated: Secondary | ICD-10-CM | POA: Diagnosis present

## 2014-09-16 DIAGNOSIS — F329 Major depressive disorder, single episode, unspecified: Secondary | ICD-10-CM | POA: Diagnosis present

## 2014-09-16 DIAGNOSIS — K219 Gastro-esophageal reflux disease without esophagitis: Secondary | ICD-10-CM | POA: Diagnosis present

## 2014-09-16 DIAGNOSIS — R569 Unspecified convulsions: Secondary | ICD-10-CM | POA: Diagnosis present

## 2014-09-16 DIAGNOSIS — E440 Moderate protein-calorie malnutrition: Secondary | ICD-10-CM | POA: Diagnosis present

## 2014-09-16 DIAGNOSIS — Z882 Allergy status to sulfonamides status: Secondary | ICD-10-CM | POA: Diagnosis not present

## 2014-09-16 DIAGNOSIS — I2510 Atherosclerotic heart disease of native coronary artery without angina pectoris: Secondary | ICD-10-CM | POA: Diagnosis present

## 2014-09-16 DIAGNOSIS — F17210 Nicotine dependence, cigarettes, uncomplicated: Secondary | ICD-10-CM | POA: Diagnosis present

## 2014-09-16 DIAGNOSIS — R509 Fever, unspecified: Secondary | ICD-10-CM

## 2014-09-16 DIAGNOSIS — G9340 Encephalopathy, unspecified: Principal | ICD-10-CM | POA: Diagnosis present

## 2014-09-16 DIAGNOSIS — K56609 Unspecified intestinal obstruction, unspecified as to partial versus complete obstruction: Secondary | ICD-10-CM | POA: Insufficient documentation

## 2014-09-16 DIAGNOSIS — I10 Essential (primary) hypertension: Secondary | ICD-10-CM | POA: Diagnosis present

## 2014-09-16 DIAGNOSIS — K861 Other chronic pancreatitis: Secondary | ICD-10-CM | POA: Diagnosis present

## 2014-09-16 DIAGNOSIS — Z888 Allergy status to other drugs, medicaments and biological substances status: Secondary | ICD-10-CM

## 2014-09-16 DIAGNOSIS — R4182 Altered mental status, unspecified: Secondary | ICD-10-CM

## 2014-09-16 DIAGNOSIS — F940 Selective mutism: Secondary | ICD-10-CM | POA: Diagnosis present

## 2014-09-16 DIAGNOSIS — K8689 Other specified diseases of pancreas: Secondary | ICD-10-CM | POA: Diagnosis present

## 2014-09-16 DIAGNOSIS — K859 Acute pancreatitis without necrosis or infection, unspecified: Secondary | ICD-10-CM

## 2014-09-16 LAB — CBC WITH DIFFERENTIAL/PLATELET
BASOS ABS: 0 10*3/uL (ref 0.0–0.1)
BASOS PCT: 0 % (ref 0–1)
EOS PCT: 1 % (ref 0–5)
Eosinophils Absolute: 0 10*3/uL (ref 0.0–0.7)
HCT: 30.3 % — ABNORMAL LOW (ref 39.0–52.0)
Hemoglobin: 10.4 g/dL — ABNORMAL LOW (ref 13.0–17.0)
Lymphocytes Relative: 8 % — ABNORMAL LOW (ref 12–46)
Lymphs Abs: 0.5 10*3/uL — ABNORMAL LOW (ref 0.7–4.0)
MCH: 30.8 pg (ref 26.0–34.0)
MCHC: 34.3 g/dL (ref 30.0–36.0)
MCV: 89.6 fL (ref 78.0–100.0)
MONO ABS: 0.3 10*3/uL (ref 0.1–1.0)
Monocytes Relative: 6 % (ref 3–12)
Neutro Abs: 5.1 10*3/uL (ref 1.7–7.7)
Neutrophils Relative %: 85 % — ABNORMAL HIGH (ref 43–77)
PLATELETS: 247 10*3/uL (ref 150–400)
RBC: 3.38 MIL/uL — ABNORMAL LOW (ref 4.22–5.81)
RDW: 16 % — AB (ref 11.5–15.5)
WBC: 5.9 10*3/uL (ref 4.0–10.5)

## 2014-09-16 LAB — COMPREHENSIVE METABOLIC PANEL
ALBUMIN: 2.8 g/dL — AB (ref 3.5–5.0)
ALT: 125 U/L — ABNORMAL HIGH (ref 17–63)
AST: 117 U/L — AB (ref 15–41)
Alkaline Phosphatase: 782 U/L — ABNORMAL HIGH (ref 38–126)
Anion gap: 11 (ref 5–15)
BUN: 13 mg/dL (ref 6–20)
CHLORIDE: 97 mmol/L — AB (ref 101–111)
CO2: 24 mmol/L (ref 22–32)
Calcium: 9.3 mg/dL (ref 8.9–10.3)
Creatinine, Ser: 0.67 mg/dL (ref 0.61–1.24)
GFR calc Af Amer: >60 mL/min (ref >60)
Glucose, Bld: 111 mg/dL — ABNORMAL HIGH (ref 65–99)
POTASSIUM: 3.7 mmol/L (ref 3.5–5.1)
Sodium: 132 mmol/L — ABNORMAL LOW (ref 135–145)
Total Bilirubin: 6.6 mg/dL — ABNORMAL HIGH (ref 0.3–1.2)
Total Protein: 6.6 g/dL (ref 6.5–8.1)

## 2014-09-16 LAB — PROCALCITONIN: Procalcitonin: 6.68 ng/mL

## 2014-09-16 LAB — I-STAT CG4 LACTIC ACID, ED
LACTIC ACID, VENOUS: 2.13 mmol/L — AB (ref 0.5–2.0)
Lactic Acid, Venous: 0.87 mmol/L (ref 0.5–2.0)

## 2014-09-16 LAB — URINALYSIS, ROUTINE W REFLEX MICROSCOPIC
GLUCOSE, UA: NEGATIVE mg/dL
Hgb urine dipstick: NEGATIVE
KETONES UR: 15 mg/dL — AB
NITRITE: NEGATIVE
PH: 6 (ref 5.0–8.0)
Protein, ur: NEGATIVE mg/dL
Specific Gravity, Urine: 1.02 (ref 1.005–1.030)
Urobilinogen, UA: 1 mg/dL (ref 0.0–1.0)

## 2014-09-16 LAB — LIPASE, BLOOD: LIPASE: 36 U/L (ref 22–51)

## 2014-09-16 LAB — PROTIME-INR
INR: 1.27 (ref 0.00–1.49)
Prothrombin Time: 16.1 seconds — ABNORMAL HIGH (ref 11.6–15.2)

## 2014-09-16 LAB — APTT: APTT: 33 s (ref 24–37)

## 2014-09-16 LAB — CBG MONITORING, ED: GLUCOSE-CAPILLARY: 101 mg/dL — AB (ref 65–99)

## 2014-09-16 LAB — AMMONIA: AMMONIA: 38 umol/L — AB (ref 9–35)

## 2014-09-16 LAB — URINE MICROSCOPIC-ADD ON

## 2014-09-16 LAB — RAPID URINE DRUG SCREEN, HOSP PERFORMED
Amphetamines: NOT DETECTED
BARBITURATES: NOT DETECTED
Benzodiazepines: NOT DETECTED
Cocaine: NOT DETECTED
OPIATES: NOT DETECTED
TETRAHYDROCANNABINOL: NOT DETECTED

## 2014-09-16 LAB — MAGNESIUM: MAGNESIUM: 1.8 mg/dL (ref 1.7–2.4)

## 2014-09-16 LAB — VALPROIC ACID LEVEL: Valproic Acid Lvl: 59 ug/mL (ref 50.0–100.0)

## 2014-09-16 LAB — ETHANOL: Alcohol, Ethyl (B): <5 mg/dL (ref <5)

## 2014-09-16 MED ORDER — OXYCODONE HCL 5 MG PO TABS: 5.0000 mg | ORAL_TABLET | Freq: Four times a day (QID) | ORAL | Status: DC | PRN

## 2014-09-16 MED ORDER — HALOPERIDOL DECANOATE 100 MG/ML IM SOLN
100.0000 mg | INTRAMUSCULAR | Status: DC
  Filled 2014-09-16: qty 1

## 2014-09-16 MED ORDER — NICOTINE 21 MG/24HR TD PT24: 21.0000 mg | MEDICATED_PATCH | Freq: Every day | TRANSDERMAL | Status: DC

## 2014-09-16 MED ORDER — PIPERACILLIN-TAZOBACTAM 3.375 G IVPB 30 MIN
3.3750 g | Freq: Once | INTRAVENOUS | Status: AC
  Administered 2014-09-16: 3.375 g via INTRAVENOUS
  Filled 2014-09-16: qty 50

## 2014-09-16 MED ORDER — VANCOMYCIN HCL IN DEXTROSE 1-5 GM/200ML-% IV SOLN
1000.0000 mg | Freq: Three times a day (TID) | INTRAVENOUS | Status: DC
  Administered 2014-09-16 – 2014-09-17 (×3): 1000 mg via INTRAVENOUS
  Filled 2014-09-16 (×5): qty 200

## 2014-09-16 MED ORDER — DIVALPROEX SODIUM 500 MG PO DR TAB: 500.0000 mg | DELAYED_RELEASE_TABLET | Freq: Two times a day (BID) | ORAL | Status: DC

## 2014-09-16 MED ORDER — ONDANSETRON HCL 4 MG/2ML IJ SOLN
4.0000 mg | Freq: Four times a day (QID) | INTRAMUSCULAR | Status: DC | PRN
  Administered 2014-09-17 – 2014-09-18 (×2): 4 mg via INTRAVENOUS
  Filled 2014-09-16 (×3): qty 2

## 2014-09-16 MED ORDER — POLYETHYLENE GLYCOL 3350 17 G PO PACK: 17.0000 g | PACK | Freq: Every day | ORAL | Status: DC

## 2014-09-16 MED ORDER — ONDANSETRON HCL 4 MG PO TABS: 4.0000 mg | ORAL_TABLET | Freq: Four times a day (QID) | ORAL | Status: DC | PRN

## 2014-09-16 MED ORDER — PIPERACILLIN-TAZOBACTAM 3.375 G IVPB
3.3750 g | Freq: Three times a day (TID) | INTRAVENOUS | Status: DC
  Administered 2014-09-16 – 2014-09-17 (×3): 3.375 g via INTRAVENOUS
  Filled 2014-09-16 (×5): qty 50

## 2014-09-16 MED ORDER — NICOTINE 21 MG/24HR TD PT24
21.0000 mg | MEDICATED_PATCH | Freq: Every day | TRANSDERMAL | Status: DC
  Administered 2014-09-17 – 2014-09-20 (×5): 21 mg via TRANSDERMAL
  Filled 2014-09-16 (×5): qty 1

## 2014-09-16 MED ORDER — METOPROLOL TARTRATE 12.5 MG HALF TABLET: 12.5000 mg | ORAL_TABLET | Freq: Two times a day (BID) | ORAL | Status: DC

## 2014-09-16 MED ORDER — ENOXAPARIN SODIUM 40 MG/0.4ML ~~LOC~~ SOLN
40.0000 mg | Freq: Every day | SUBCUTANEOUS | Status: DC
  Administered 2014-09-16 – 2014-09-20 (×5): 40 mg via SUBCUTANEOUS
  Filled 2014-09-16 (×5): qty 0.4

## 2014-09-16 MED ORDER — KCL IN DEXTROSE-NACL 20-5-0.9 MEQ/L-%-% IV SOLN
INTRAVENOUS | Status: DC
  Administered 2014-09-16: 1000 mL via INTRAVENOUS
  Administered 2014-09-17 (×2): via INTRAVENOUS
  Administered 2014-09-18: 1000 mL via INTRAVENOUS
  Administered 2014-09-18 – 2014-09-19 (×2): via INTRAVENOUS
  Filled 2014-09-16 (×10): qty 1000

## 2014-09-16 MED ORDER — CLOPIDOGREL BISULFATE 75 MG PO TABS: 75.0000 mg | ORAL_TABLET | Freq: Every day | ORAL | Status: DC

## 2014-09-16 MED ORDER — SODIUM CHLORIDE 0.9 % IV SOLN
INTRAVENOUS | Status: DC
  Administered 2014-09-16: 04:00:00 via INTRAVENOUS

## 2014-09-16 MED ORDER — MORPHINE SULFATE (PF) 2 MG/ML IV SOLN: 2.0000 mg | INTRAVENOUS | Status: DC | PRN

## 2014-09-16 MED ORDER — SODIUM CHLORIDE 0.9 % IV BOLUS (SEPSIS)
1000.0000 mL | INTRAVENOUS | Status: AC
  Administered 2014-09-16 (×2): 1000 mL via INTRAVENOUS

## 2014-09-16 MED ORDER — FUROSEMIDE 40 MG PO TABS
40.0000 mg | ORAL_TABLET | Freq: Every day | ORAL | Status: DC
  Filled 2014-09-16: qty 1

## 2014-09-16 MED ORDER — ENSURE ENLIVE PO LIQD
237.0000 mL | Freq: Two times a day (BID) | ORAL | Status: DC
  Filled 2014-09-16 (×4): qty 237

## 2014-09-16 MED ORDER — LIDOCAINE HCL 2 % EX GEL
1.0000 "application " | Freq: Once | CUTANEOUS | Status: AC
  Administered 2014-09-16: 1 via TOPICAL
  Filled 2014-09-16: qty 20

## 2014-09-16 MED ORDER — TRAZODONE HCL 100 MG PO TABS: 100.0000 mg | ORAL_TABLET | Freq: Every day | ORAL | Status: DC

## 2014-09-16 MED ORDER — HYDRALAZINE HCL 10 MG PO TABS: 10.0000 mg | ORAL_TABLET | Freq: Two times a day (BID) | ORAL | Status: DC

## 2014-09-16 MED ORDER — VITAMIN B-1 100 MG PO TABS: 100.0000 mg | ORAL_TABLET | Freq: Every day | ORAL | Status: DC

## 2014-09-16 MED ORDER — QUETIAPINE FUMARATE 50 MG PO TABS: 50.0000 mg | ORAL_TABLET | Freq: Every day | ORAL | Status: DC

## 2014-09-16 MED ORDER — KETOROLAC TROMETHAMINE 30 MG/ML IJ SOLN
30.0000 mg | Freq: Once | INTRAMUSCULAR | Status: AC
  Administered 2014-09-16: 30 mg via INTRAVENOUS
  Filled 2014-09-16: qty 1

## 2014-09-16 MED ORDER — LEVETIRACETAM 500 MG PO TABS: 1000.0000 mg | ORAL_TABLET | Freq: Two times a day (BID) | ORAL | Status: DC

## 2014-09-16 MED ORDER — BENZTROPINE MESYLATE 0.5 MG PO TABS: 0.5000 mg | ORAL_TABLET | Freq: Two times a day (BID) | ORAL | Status: DC

## 2014-09-16 MED ORDER — FOLIC ACID 1 MG PO TABS: 1.0000 mg | ORAL_TABLET | Freq: Every day | ORAL | Status: DC

## 2014-09-16 MED ORDER — PANCRELIPASE (LIP-PROT-AMYL) 12000-38000 UNITS PO CPEP: 36000.0000 [IU] | ORAL_CAPSULE | Freq: Three times a day (TID) | ORAL | Status: DC

## 2014-09-16 MED ORDER — VANCOMYCIN HCL IN DEXTROSE 1-5 GM/200ML-% IV SOLN
1000.0000 mg | Freq: Once | INTRAVENOUS | Status: AC
  Administered 2014-09-16: 1000 mg via INTRAVENOUS
  Filled 2014-09-16: qty 200

## 2014-09-16 MED ORDER — VITAMIN B-12 100 MCG PO TABS
500.0000 ug | ORAL_TABLET | Freq: Every day | ORAL | Status: DC
  Filled 2014-09-16: qty 5

## 2014-09-16 MED ORDER — NICOTINE 21 MG/24HR TD PT24
21.0000 mg | MEDICATED_PATCH | Freq: Once | TRANSDERMAL | Status: AC
  Administered 2014-09-16: 21 mg via TRANSDERMAL
  Filled 2014-09-16: qty 1

## 2014-09-16 MED ORDER — LORAZEPAM 0.5 MG PO TABS: 0.5000 mg | ORAL_TABLET | Freq: Four times a day (QID) | ORAL | Status: DC | PRN

## 2014-09-16 NOTE — ED Notes (Signed)
Pt found to have climbed out of bed in room; pt requesting to go outside to "smoke." Pt assisted back to bed. Pt more alert at this time. MD at bedside

## 2014-09-16 NOTE — Progress Notes (Signed)
Patient admitted after midnight. Current and old Chart reviewed. Patient examined. Pt very lethargic. Briefly arouses to voice but doesn't follow commands. Moving all extremities. Will hold all po meds and keep NPO. Per Dr. Broadus John, who discharge patient a few days ago, usually ambulating, talking, trying to leave AMA, etc. UA negative. Urine drug strain blood alcohol unremarkable. Ammonia level not bad. LFTs elevated but not terribly different from last admission. White blood cell count normal. Will check Keppra level. Check pro calcitonin. On multiple sedating medications and may be related to this. Updated brother.  Time 40 minutes  Doree Barthel, MD [facility]

## 2014-09-16 NOTE — ED Notes (Signed)
Blood cultures x2 drawn prior to antibiotics.

## 2014-09-16 NOTE — ED Provider Notes (Addendum)
TIME SEEN: 3:08 AM   CHIEF COMPLAINT: Altered Mental Status   HPI: HPI Comments: Level 5 Caveat: Mental Status Change  Chase Blackwell is a 49 y.o. male with history of hypertension, schizophrenia, seizures on Depakote and Keppra, alcohol abuse brought in by ambulance, who presents to the Emergency Department from [place] for AMS and tachycardia that started today. He was recently admitted for abdominal pain and was found to have a pancreatic mass. Was also hyponatremic at that time. Patient unable to answer questions. It does not appear that he is on any new medication for his MAR. He lives at [facility] in [place].   ROS: Level V caveat for altered mental status  PAST MEDICAL HISTORY/PAST SURGICAL HISTORY:  Past Medical History  Diagnosis Date  . Hypertension   . Asthma   . GERD (gastroesophageal reflux disease)   . Chronic pain   . Schizophrenia   . Alcohol abuse   . Seizures 07/2013    seizure like activity following cardiac/resp arrest.   . 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 Depression   . CAD (coronary artery disease)   . Cardiac arrest 07/2013    with acute MI and secondary respiratory arrest  . Protein calorie malnutrition 07/2013    MEDICATIONS:  Prior to Admission medications   Medication Sig Start Date End Date Taking? Authorizing Provider  aspirin 81 MG chewable tablet Chew 1 tablet (81 mg total) by mouth daily. 11/23/13   Zyad Boomer N Melroy, DO  atorvastatin (LIPITOR) 80 MG tablet Take 1 tablet (80 mg total) by mouth daily. Patient not taking: Reported on 09/10/2014 02/13/14   Elmyra Ricks Pisciotta, PA-C  benztropine (COGENTIN) 0.5 MG tablet Take 1 tablet (0.5 mg total) by mouth 2 (two) times daily. 08/09/14   Nita Sells, MD  clopidogrel (PLAVIX) 75 MG tablet Take 75 mg by mouth daily. 06/11/14   Historical Provider, MD  divalproex (DEPAKOTE) 500 MG DR tablet Take 1 tablet (500 mg total) by mouth 2 (two) times daily. 09/13/14   Domenic Polite, MD   feeding supplement, ENSURE ENLIVE, (ENSURE ENLIVE) LIQD Take 237 mLs by mouth 2 (two) times daily between meals. 06/11/14   Eugenie Filler, MD  folic acid (FOLVITE) 1 MG tablet Take 1 tablet (1 mg total) by mouth daily. 06/11/14   Eugenie Filler, MD  furosemide (LASIX) 40 MG tablet Take 40 mg by mouth daily. 07/18/14   Historical Provider, MD  haloperidol decanoate (HALDOL DECANOATE) 100 MG/ML injection Inject 1 mL (100 mg total) into the muscle every 28 (twenty-eight) days. 08/09/14   Nita Sells, MD  hydrALAZINE (APRESOLINE) 10 MG tablet Take 1 tablet (10 mg total) by mouth 2 (two) times daily. 02/13/14   Nicole Pisciotta, PA-C  levETIRAcetam (KEPPRA) 1000 MG tablet Take 1 tablet (1,000 mg total) by mouth 2 (two) times daily. 08/09/14   Nita Sells, MD  LORazepam (ATIVAN) 0.5 MG tablet TAKE 1 TABLET BY MOUTH EVERY EVENING AS NEEDED 09/14/14   Domenic Polite, MD  metoprolol tartrate (LOPRESSOR) 25 MG tablet Take 0.5 tablets (12.5 mg total) by mouth 2 (two) times daily. Patient taking differently: Take 25 mg by mouth daily.  02/13/14   Nicole Pisciotta, PA-C  nicotine (NICODERM CQ - DOSED IN MG/24 HOURS) 21 mg/24hr patch Place 1 patch (21 mg total) onto the skin daily. 09/13/14   Domenic Polite, MD  oxyCODONE (OXY IR/ROXICODONE) 5 MG immediate release tablet Take 1 tablet (5 mg total) by mouth every 6 (  six) hours as needed for moderate pain or severe pain. 09/14/14   Domenic Polite, MD  Pancrelipase, Lip-Prot-Amyl, 24000 UNITS CPEP Two PO with meals (TID) and one PO with snacks (BID). Total of 8 daily. 09/06/14   Mahala Menghini, PA-C  polyethylene glycol (MIRALAX / GLYCOLAX) packet Take 17 g by mouth daily.    Historical Provider, MD  QUEtiapine (SEROQUEL) 50 MG tablet Take 1 tablet (50 mg total) by mouth at bedtime. 08/09/14   Nita Sells, MD  thiamine 100 MG tablet Take 1 tablet (100 mg total) by mouth daily. 06/11/14   Eugenie Filler, MD  traZODone (DESYREL) 100 MG tablet Take 1  tablet (100 mg total) by mouth at bedtime. 08/09/14   Nita Sells, MD  vitamin B-12 500 MCG tablet Take 1 tablet (500 mcg total) by mouth daily. 07/02/14   Orson Eva, MD    ALLERGIES:  Allergies  Allergen Reactions  . Hctz [Hydrochlorothiazide] Other (See Comments)    Dizzy spells  . Hydroxyzine Hives  . Sulfonamide Derivatives Hives  . Cetirizine & Related Other (See Comments)    Brothers were not aware of this allergy    SOCIAL HISTORY:  Social History  Substance Use Topics  . Smoking status: Current Every Day Smoker -- 2.00 packs/day    Types: Cigarettes  . Smokeless tobacco: Not on file  . Alcohol Use: 0.0 oz/week    0 Standard drinks or equivalent per week     Comment: daily heavy drinker, 80 ounces every 1-2 days. for years    FAMILY HISTORY: Family History  Problem Relation Age of Onset  . Malignant hyperthermia Mother   . Cirrhosis Father   . Alcohol abuse Father     EXAM: BP 116/77 mmHg  Pulse 110  Temp(Src) 99 F (37.2 C) (Axillary)  SpO2 98%   CONSTITUTIONAL: Pt arousable to painful stimuli and voice intermittently, GCS of 10 answers "yea" to all questions; chronically ill appearing, febrile HEAD: Normocephalic EYES: PERRL, scleral icterus  ENT: normal nose; no rhinorrhea; moist mucous membranes; pharynx without lesions noted NECK: Supple, no meningismus, no LAD  CARD: regular rhythm and tachycardic; S1 and S2 appreciated; no murmurs, no clicks, no rubs, no gallops RESP: Normal chest excursion without splinting or tachypnea; breath sounds clear and equal bilaterally; no wheezes, no rhonchi, no rales, no hypoxia or respiratory distress, speaking full sentences ABD/GI: Normal bowel sounds; distended with fluid wave and mildly tender to palpation diffusely with no guarding, no rebound, no peritoneal signs.  BACK:  The back appears normal and is non-tender to palpation, there is no CVA tenderness EXT: Normal ROM in all joints; non-tender to palpation; no  edema; normal capillary refill; no cyanosis, no calf tenderness or swelling    SKIN: Jaundice; warm NEURO: Moves all extremities, No asterixis  PSYCH: The patient's mood and manner are appropriate. Grooming and personal hygiene are appropriate.  MEDICAL DECISION MAKING: Patient here with altered mental status, fever and tachycardia. We'll start sepsis criteria including giving IV fluids and broad-spectrum antibiotics. Will check labs, cultures, ammonia level, chest x-ray, urine and head CT. Patient will need admission.  ED PROGRESS: 5:40 AM  Pt now more awake, talking. He has been able to ambulate. Oriented to person and year but not to place. Complains of abdominal pain. Denies headache, neck pain or neck stiffness. No meningismus on exam.  Patient's labs show slightly elevated lactate at 2.13, ammonia is also elevated at 38 but is lower than his previous visits. He  has no leukocytosis. LFTs are chronically elevated. Head CT shows no acute abnormality.  CXR shows no obvious PNA.   6:20 AM  D.w Dr. Olevia Bowens for admission to tele.  7:00 AM  Pt's urine does not show obvious infection. Lactate has improved. Patient does complain of abdominal pain and has some tenderness diffusely but no peritoneal signs. We'll likely need tap by IR.  Discussed this with hospitalist.   EKG Interpretation  Date/Time:  Monday September 16 2014 03:14:03 EDT Ventricular Rate:  113 PR Interval:  151 QRS Duration: 90 QT Interval:  352 QTC Calculation: 483 R Axis:   79 Text Interpretation:  Sinus tachycardia Probable left atrial enlargement Borderline prolonged QT interval Baseline wander in lead(s) V4 V5 Confirmed by Silberman,  DO, Niyah Mamaril ([id number]) on 09/16/2014 3:33:11 AM        CRITICAL CARE Performed by: Nyra Jabs   Total critical care time: 45 minutes  Critical care time was exclusive of separately billable procedures and treating other patients.  Critical care was necessary to treat or prevent imminent  or life-threatening deterioration.  Critical care was time spent personally by me on the following activities: development of treatment plan with patient and/or surrogate as well as nursing, discussions with consultants, evaluation of patient's response to treatment, examination of patient, obtaining history from patient or surrogate, ordering and performing treatments and interventions, ordering and review of laboratory studies, ordering and review of radiographic studies, pulse oximetry and re-evaluation of patient's condition.     I personally performed the services described in this documentation, which was scribed in my presence. The recorded information has been reviewed and is accurate.    [place], DO 09/16/14 [place], DO 09/16/14 [phone]

## 2014-09-16 NOTE — H&P (Signed)
[facility] History and Physical  Chase Blackwell QQV:[id number] DOB: 11-14-1965 DOA: 09/16/2014  Referring physician: Delice Bison Rhody, DO PCP: [name], MD   Chief Complaint: Change in mental status.  HPI: Chase Blackwell is a 49 y.o. male with past medical history of hypertension, asthma, GERD, schizophrenia, alcohol abuse, seizures, recurrent pancreatitis, pancreatic mass, depression, CAD who was sent by [facility] in [place], a nursing home facility for evaluation due to change in mental status. Patient was recently admitted with a pancreatic mass. Other than the medical records and documents sent from the nursing home, there is no further history available.   Review of Systems:  Unable to obtain due to the patient's mental status.  Past Medical History  Diagnosis Date  . Hypertension   . Asthma   . GERD (gastroesophageal reflux disease)   . Chronic pain   . Schizophrenia   . Alcohol abuse   . Seizures 07/2013    seizure like activity following cardiac/resp arrest.   . Pancreatitis 03/3293    alcoholic, recurrent  . Depression   . CAD (coronary artery disease)   . Cardiac arrest 07/2013    with acute MI and secondary respiratory arrest  . Protein calorie malnutrition 07/2013   Past Surgical History  Procedure Laterality Date  . Left heart catheterization with coronary angiogram N/A 07/06/2013    Procedure: LEFT HEART CATHETERIZATION WITH CORONARY ANGIOGRAM;  Surgeon: Clent Demark, MD;  Location: [facility] CATH LAB;  Service: Cardiovascular;  Laterality: N/A;  . Percutaneous coronary stent intervention (pci-s)  07/06/2013    Procedure: PERCUTANEOUS CORONARY STENT INTERVENTION (PCI-S);  Surgeon: Clent Demark, MD;  Location: [facility] CATH LAB;  Service: Cardiovascular;;  . Tracheostomy  07/2013   Social History:  reports that Chase Blackwell has been smoking Cigarettes.  Chase Blackwell has been smoking about 2.00 packs per day. Chase Blackwell does not have any smokeless tobacco history on file. Chase Blackwell reports that Chase Blackwell  drinks alcohol. Chase Blackwell reports that Chase Blackwell uses illicit drugs (Marijuana).  Allergies  Allergen Reactions  . Hctz [Hydrochlorothiazide] Other (See Comments)    Dizzy spells  . Hydroxyzine Hives  . Sulfonamide Derivatives Hives  . Cetirizine & Related Other (See Comments)    Brothers were not aware of this allergy    Family History  Problem Relation Age of Onset  . Malignant hyperthermia Mother   . Cirrhosis Father   . Alcohol abuse Father     Prior to Admission medications   Medication Sig Start Date End Date Taking? Authorizing Provider  benztropine (COGENTIN) 0.5 MG tablet Take 1 tablet (0.5 mg total) by mouth 2 (two) times daily. 08/09/14  Yes Nita Sells, MD  clopidogrel (PLAVIX) 75 MG tablet Take 75 mg by mouth daily. 06/11/14  Yes Historical Provider, MD  divalproex (DEPAKOTE) 500 MG DR tablet Take 1 tablet (500 mg total) by mouth 2 (two) times daily. 09/13/14  Yes Domenic Polite, MD  feeding supplement, ENSURE ENLIVE, (ENSURE ENLIVE) LIQD Take 237 mLs by mouth 2 (two) times daily between meals. 06/11/14  Yes Eugenie Filler, MD  furosemide (LASIX) 40 MG tablet Take 40 mg by mouth daily. 07/18/14  Yes Historical Provider, MD  haloperidol decanoate (HALDOL DECANOATE) 100 MG/ML injection Inject 1 mL (100 mg total) into the muscle every 28 (twenty-eight) days. 08/09/14  Yes Nita Sells, MD  hydrALAZINE (APRESOLINE) 10 MG tablet Take 1 tablet (10 mg total) by mouth 2 (two) times daily. 02/13/14  Yes Nicole Pisciotta, PA-C  levETIRAcetam (KEPPRA) 1000  MG tablet Take 1 tablet (1,000 mg total) by mouth 2 (two) times daily. 08/09/14  Yes Nita Sells, MD  metoprolol tartrate (LOPRESSOR) 25 MG tablet Take 0.5 tablets (12.5 mg total) by mouth 2 (two) times daily. 02/13/14  Yes Nicole Pisciotta, PA-C  nicotine (NICODERM CQ - DOSED IN MG/24 HOURS) 21 mg/24hr patch Place 1 patch (21 mg total) onto the skin daily. 09/13/14  Yes Domenic Polite, MD  Pancrelipase, Lip-Prot-Amyl, 24000 UNITS CPEP  Two PO with meals (TID) and one PO with snacks (BID). Total of 8 daily. 09/06/14  Yes Mahala Menghini, PA-C  polyethylene glycol (MIRALAX / GLYCOLAX) packet Take 17 g by mouth daily.   Yes Historical Provider, MD  thiamine 100 MG tablet Take 1 tablet (100 mg total) by mouth daily. 06/11/14  Yes Eugenie Filler, MD  traZODone (DESYREL) 100 MG tablet Take 1 tablet (100 mg total) by mouth at bedtime. 08/09/14  Yes Nita Sells, MD  vitamin B-12 500 MCG tablet Take 1 tablet (500 mcg total) by mouth daily. 07/02/14  Yes Orson Eva, MD  aspirin 81 MG chewable tablet Chew 1 tablet (81 mg total) by mouth daily. Patient not taking: Reported on 09/16/2014 11/23/13   Kristen N Shi, DO  atorvastatin (LIPITOR) 80 MG tablet Take 1 tablet (80 mg total) by mouth daily. Patient not taking: Reported on 09/10/2014 02/13/14   Elmyra Ricks Pisciotta, PA-C  folic acid (FOLVITE) 1 MG tablet Take 1 tablet (1 mg total) by mouth daily. Patient not taking: Reported on 09/16/2014 06/11/14   Eugenie Filler, MD  LORazepam (ATIVAN) 0.5 MG tablet TAKE 1 TABLET BY MOUTH EVERY EVENING AS NEEDED 09/14/14   Domenic Polite, MD  oxyCODONE (OXY IR/ROXICODONE) 5 MG immediate release tablet Take 1 tablet (5 mg total) by mouth every 6 (six) hours as needed for moderate pain or severe pain. 09/14/14   Domenic Polite, MD  QUEtiapine (SEROQUEL) 50 MG tablet Take 1 tablet (50 mg total) by mouth at bedtime. Patient not taking: Reported on 09/16/2014 08/09/14   Nita Sells, MD   Physical Exam: Filed Vitals:   09/16/14 0330 09/16/14 0343 09/16/14 0410 09/16/14 0517  BP: 112/65   132/89  Pulse: 111   98  Temp:  101 F (38.3 C)    TempSrc:  Rectal    Resp: 15     Weight:   63.504 kg (140 lb)   SpO2: 98%   100%    Wt Readings from Last 3 Encounters:  09/16/14 63.504 kg (140 lb)  09/13/14 63 kg (138 lb 14.2 oz)  09/06/14 64.411 kg (142 lb)    General:  Chase Blackwell is somnolent, but in no acute distress. Looks comfortable Eyes: PERRL, normal lids,  irises & conjunctiva ENT: grossly normal hearing, lips & tongue are very dry. Neck: no LAD, masses or thyromegaly Cardiovascular: Tachycardic at 104 bpm, no m/r/g. No LE edema. Telemetry: Sinus tachycardia in the low 100s. Respiratory: CTA bilaterally, no w/r/r. Normal respiratory effort. Abdomen: soft, positive epigastric tenderness, no guarding no rebound tenderness. Skin: no rash or induration seen on limited exam Musculoskeletal: grossly normal tone BUE/BLE Psychiatric: Somnolent, briefly arousable, but goes back to sleep after several seconds. Answers all questions with a yes. Neurologic: Unable to evaluate due to the patient's mental status.           Labs on Admission:  Basic Metabolic Panel:  Recent Labs Lab 09/10/14 0518 09/11/14 0548 09/12/14 0547 09/14/14 0533 09/16/14 0318  NA 132* 133* 135 137 132*  K 4.5 4.1 4.1 3.7 3.7  CL 100* 100* 101 100* 97*  CO2 24 25 23 27 24   GLUCOSE 77 77 76 107* 111*  BUN 18 11 6  <5* 13  CREATININE 0.76 0.67 0.66 0.60* 0.67  CALCIUM 8.8* 8.7* 8.7* 9.4 9.3   Liver Function Tests:  Recent Labs Lab 09/10/14 0518 09/11/14 0548 09/12/14 0547 09/14/14 0533 09/16/14 0318  AST 67* 53* 45* 48* 117*  ALT 71* 63 61 63 125*  ALKPHOS 819* 736* 754* 746* 782*  BILITOT 7.1* 7.2* 7.4* 5.5* 6.6*  PROT 5.9* 6.2* 6.5 6.7 6.6  ALBUMIN 2.7* 2.7* 2.8* 2.9* 2.8*    Recent Labs Lab 09/09/14 2153 09/16/14 0318  LIPASE 62* 36    Recent Labs Lab 09/10/14 0714 09/11/14 0548 09/16/14 0318  AMMONIA 44* 65* 38*   CBC:  Recent Labs Lab 09/09/14 2153 09/10/14 0518 09/11/14 0548 09/12/14 0547 09/14/14 0533 09/16/14 0318  WBC 8.9 7.2 6.0 4.7 7.6 5.9  NEUTROABS 5.5  --   --   --   --  5.1  HGB 10.6* 10.1* 9.9* 9.9* 10.3* 10.4*  HCT 30.8* 29.9* 29.3* 29.5* 29.5* 30.3*  MCV 90.1 90.3 91.0 91.6 91.6 89.6  PLT 370 335 314 299 278 247    BNP (last 3 results)  Recent Labs  05/01/14 2153 06/06/14 2206  BNP 21.0 28.5      CBG:  Recent Labs Lab 09/11/14 0739 09/12/14 0743 09/13/14 0739 09/14/14 0828 09/16/14 0320  GLUCAP 72 76 83 102* 101*    Radiological Exams on Admission: Dg Chest 2 View  09/16/2014   CLINICAL DATA:  Altered mental status.  Tachycardia.  Onset today.  EXAM: CHEST  2 VIEW  COMPARISON:  08/02/2014  FINDINGS: The cardiomediastinal contours are normal. Lung volumes are low. Pulmonary vasculature is normal. No consolidation, pleural effusion, or pneumothorax. No acute osseous abnormalities are seen.  IMPRESSION: No acute pulmonary process.   Electronically Signed   By: Jeb Levering M.D.   On: 09/16/2014 06:23   Ct Head Wo Contrast  09/16/2014   CLINICAL DATA:  Altered mental status, disoriented and lethargic.  EXAM: CT HEAD WITHOUT CONTRAST  TECHNIQUE: Contiguous axial images were obtained from the base of the skull through the vertex without intravenous contrast.  COMPARISON:  06/18/2014  FINDINGS: Generalized cerebral and cerebellar volume loss, unchanged.No intracranial hemorrhage, mass effect, or midline shift. No hydrocephalus. The basilar cisterns are patent. No evidence of territorial infarct. No intracranial fluid collection. Calvarium is intact. Included paranasal sinuses and mastoid air cells are well aerated.  IMPRESSION: Generalized volume loss without acute intracranial abnormality.   Electronically Signed   By: Jeb Levering M.D.   On: 09/16/2014 05:18    EKG: Independently reviewed.  Vent. rate 113 BPM PR interval 151 ms QRS duration 90 ms QT/QTc 352/483 ms P-R-T axes 82 79 49  Sinus tachycardia Probable left atrial enlargement Borderline prolonged QT interval Baseline wander in lead(s) V4 V5  Assessment/Plan Principal Problem:   Change in mental status Admit to stepdown for closer monitoring.  At this time there is no obvious source for infection, but will continue Zosyn and vancomycin which was already started by the emergency department. Follow-up  rest of the workup, blood cultures and sensitivity.  Active Problems:   PARANOID SCHIZOPHRENIA, CHRONIC Continue current medications as given by skilled nursing facility.    Alcohol abuse No signs of intoxication at this time.    Essential hypertension Continue current antihypertensive therapy and monitor blood  pressure.    Seizures Continue antiepileptic medications and seizure precautions.    CAD (coronary artery disease) Continue beta blocker and antiplatelet agents.    Anemia Monitor H&H.    Pancreatic mass It is unknown at this time what is the plan with this. Chase Blackwell just underwent ERCP for CBD stent placement. Fine-needle biopsy is preliminarily positive for neoplastic cells. The patient is supposed to follow with Dr. Alen Blew from the oncology service tomorrow at 10:30 AM. However, given his clinical condition, I doubt that Chase Blackwell will be able to make this appointment.    Code Status: Full code. DVT Prophylaxis: Lovenox SQ. Family Communication:  Disposition Plan: Admit to the stepdown for closer monitoring and IV antibiotic therapy.   Time spent: Over 70 minutes were spent during the process of this admission.  Reubin Milan, MD. [facility] Pager [phone].

## 2014-09-16 NOTE — Progress Notes (Signed)
Patient admitted from ED, confused and drowsy but arousable to speech BP is on low side, he is jaundiced will continue to monitor.

## 2014-09-16 NOTE — Progress Notes (Signed)
Patient has become impulsive and confused more so this after noon late, he is more difficult to redirect and needs a Air cabin crew, he is on the list will see if he is provided with one tonight. Will continue to monitor.

## 2014-09-16 NOTE — ED Notes (Signed)
Pt refusing in and out cath; attempted to use urinal by sitting up in bed.

## 2014-09-16 NOTE — ED Notes (Signed)
In and out cath attempted with assistance from [place], [place]. Resistance met; MD made aware

## 2014-09-16 NOTE — ED Notes (Addendum)
Pt sent from [place] facility for tachycardia and decreased LOC. Pt was recently discharged into SNF on 9/10. Pt responsive to pain. At times, pt c/o abdominal pain, worse on movement; distention noted. Pt is jaundice; able to follow some commands

## 2014-09-16 NOTE — ED Notes (Signed)
Nicotine patches removed from left and right shoulder; requesting new patch be ordered

## 2014-09-16 NOTE — Progress Notes (Signed)
Noted 2nd inpatient admit order for SDU, original bed request was for med-surg. Notified Dr. Conley Canal to clarify where patient needs to be. At this time per Dr. Conley Canal patient will remain here on tele until she comes to see him to clarify transfer to another unit (Med Tele Vs. SDU.)  Annie Main, RN aware.  Meyer Cory, RN, Market researcher of Nursing

## 2014-09-16 NOTE — ED Notes (Signed)
Pt arrived via EMS from [place].  Pt is disoriented, and lethargic follows some commands.  Jaundice, distended abdomen.

## 2014-09-16 NOTE — Progress Notes (Signed)
ANTIBIOTIC CONSULT NOTE - INITIAL  Pharmacy Consult for Vancomycin and Zosyn Indication: sepsis  Allergies  Allergen Reactions  . Hctz [Hydrochlorothiazide] Other (See Comments)    Dizzy spells  . Hydroxyzine Hives  . Sulfonamide Derivatives Hives  . Cetirizine & Related Other (See Comments)    Brothers were not aware of this allergy    Patient Measurements:   Ht: 72 in   Wt: 63 kg  Vital Signs: Temp: 101 F (38.3 C) (09/12 0343) Temp Source: Rectal (09/12 0343) BP: 100/62 mmHg (09/12 0300) Pulse Rate: 113 (09/12 0300) Intake/Output from previous day:   Intake/Output from this shift:    Labs:  Recent Labs  09/14/14 0533  WBC 7.6  HGB 10.3*  PLT 278  CREATININE 0.60*   Estimated Creatinine Clearance: 100.6 mL/min (by C-G formula based on Cr of 0.6). No results for input(s): VANCOTROUGH, VANCOPEAK, VANCORANDOM, GENTTROUGH, GENTPEAK, GENTRANDOM, TOBRATROUGH, TOBRAPEAK, TOBRARND, AMIKACINPEAK, AMIKACINTROU, AMIKACIN in the last 72 hours.   Microbiology: Recent Results (from the past 720 hour(s))  Surgical pcr screen     Status: None   Collection Time: 09/13/14  7:40 AM  Result Value Ref Range Status   MRSA, PCR NEGATIVE NEGATIVE Final   Staphylococcus aureus NEGATIVE NEGATIVE Final    Comment:        The Xpert SA Assay (FDA approved for NASAL specimens in patients over 79 years of age), is one component of a comprehensive surveillance program.  Test performance has been validated by [facility] for patients greater than or equal to 20 year old. It is not intended to diagnose infection nor to guide or monitor treatment.     Medical History: Past Medical History  Diagnosis Date  . Hypertension   . Asthma   . GERD (gastroesophageal reflux disease)   . Chronic pain   . Schizophrenia   . Alcohol abuse   . Seizures 07/2013    seizure like activity following cardiac/resp arrest.   . Pancreatitis 09/3233    alcoholic, recurrent  . Depression   . CAD  (coronary artery disease)   . Cardiac arrest 07/2013    with acute MI and secondary respiratory arrest  . Protein calorie malnutrition 07/2013    Medications:  See electronic med rec  Assessment: 49 y.o. male presents from [facility] with AMS. Noted pt just d/c home from hospital on 9/10. Tm 101. WBC wnl. Scr 0.67, est CrCl 100 ml/min. Pt to start Vancomycin and Zosyn for sepsis.  Goal of Therapy:  Vancomycin trough level 15-20 mcg/ml  Plan:  Zosyn 3.375gm IV now over 30 min then 3.375gm IV q8h - subsequent doses over 4 hours Vancomycin 1gm IV q8h Will f/u micro data, renal function, and pt's clinical condition Vanc trough prn  Sherlon Handing, PharmD, BCPS Clinical pharmacist, pager [phone] 09/16/2014,3:52 AM

## 2014-09-17 ENCOUNTER — Other Ambulatory Visit: Payer: Self-pay

## 2014-09-17 ENCOUNTER — Encounter (HOSPITAL_COMMUNITY): Payer: Self-pay | Admitting: Gastroenterology

## 2014-09-17 ENCOUNTER — Ambulatory Visit: Payer: Self-pay | Admitting: Oncology

## 2014-09-17 LAB — COMPREHENSIVE METABOLIC PANEL
ALBUMIN: 2 g/dL — AB (ref 3.5–5.0)
ALT: 73 U/L — AB (ref 17–63)
AST: 45 U/L — AB (ref 15–41)
Alkaline Phosphatase: 508 U/L — ABNORMAL HIGH (ref 38–126)
Anion gap: 8 (ref 5–15)
BUN: 10 mg/dL (ref 6–20)
CHLORIDE: 107 mmol/L (ref 101–111)
CO2: 22 mmol/L (ref 22–32)
CREATININE: 0.69 mg/dL (ref 0.61–1.24)
Calcium: 8.1 mg/dL — ABNORMAL LOW (ref 8.9–10.3)
GFR calc non Af Amer: >60 mL/min (ref >60)
GLUCOSE: 86 mg/dL (ref 65–99)
Potassium: 3.6 mmol/L (ref 3.5–5.1)
SODIUM: 137 mmol/L (ref 135–145)
Total Bilirubin: 3.8 mg/dL — ABNORMAL HIGH (ref 0.3–1.2)
Total Protein: 5.1 g/dL — ABNORMAL LOW (ref 6.5–8.1)

## 2014-09-17 LAB — CBC
HCT: 28.6 % — ABNORMAL LOW (ref 39.0–52.0)
Hemoglobin: 9.8 g/dL — ABNORMAL LOW (ref 13.0–17.0)
MCH: 30.9 pg (ref 26.0–34.0)
MCHC: 34.3 g/dL (ref 30.0–36.0)
MCV: 90.2 fL (ref 78.0–100.0)
PLATELETS: 190 10*3/uL (ref 150–400)
RBC: 3.17 MIL/uL — AB (ref 4.22–5.81)
RDW: 15.8 % — ABNORMAL HIGH (ref 11.5–15.5)
WBC: 4 10*3/uL (ref 4.0–10.5)

## 2014-09-17 MED ORDER — NICOTINE 21 MG/24HR TD PT24: 21.0000 mg | MEDICATED_PATCH | Freq: Every day | TRANSDERMAL | Status: DC

## 2014-09-17 MED ORDER — LORAZEPAM 0.5 MG PO TABS
0.5000 mg | ORAL_TABLET | Freq: Four times a day (QID) | ORAL | Status: DC | PRN
  Administered 2014-09-17: 0.5 mg via ORAL
  Filled 2014-09-17: qty 1

## 2014-09-17 MED ORDER — PANCRELIPASE (LIP-PROT-AMYL) 12000-38000 UNITS PO CPEP
36000.0000 [IU] | ORAL_CAPSULE | Freq: Three times a day (TID) | ORAL | Status: DC
  Administered 2014-09-17 – 2014-09-20 (×8): 36000 [IU] via ORAL
  Filled 2014-09-17 (×10): qty 3

## 2014-09-17 MED ORDER — LEVETIRACETAM 500 MG PO TABS
500.0000 mg | ORAL_TABLET | Freq: Two times a day (BID) | ORAL | Status: DC
  Administered 2014-09-17 (×2): 500 mg via ORAL
  Filled 2014-09-17 (×3): qty 1

## 2014-09-17 MED ORDER — FOLIC ACID 1 MG PO TABS
1.0000 mg | ORAL_TABLET | Freq: Every day | ORAL | Status: DC
  Administered 2014-09-17 – 2014-09-20 (×4): 1 mg via ORAL
  Filled 2014-09-17 (×4): qty 1

## 2014-09-17 MED ORDER — MORPHINE SULFATE (PF) 2 MG/ML IV SOLN
2.0000 mg | Freq: Once | INTRAVENOUS | Status: AC
  Administered 2014-09-17: 2 mg via INTRAVENOUS
  Filled 2014-09-17: qty 1

## 2014-09-17 MED ORDER — ENSURE ENLIVE PO LIQD
237.0000 mL | Freq: Two times a day (BID) | ORAL | Status: DC
  Administered 2014-09-17 – 2014-09-18 (×3): 237 mL via ORAL
  Filled 2014-09-17 (×6): qty 237

## 2014-09-17 MED ORDER — DIVALPROEX SODIUM 125 MG PO DR TAB
125.0000 mg | DELAYED_RELEASE_TABLET | Freq: Two times a day (BID) | ORAL | Status: DC
  Administered 2014-09-17 (×2): 125 mg via ORAL
  Filled 2014-09-17 (×4): qty 1

## 2014-09-17 MED ORDER — OXYCODONE HCL 5 MG PO TABS
5.0000 mg | ORAL_TABLET | Freq: Two times a day (BID) | ORAL | Status: DC | PRN
  Administered 2014-09-17 – 2014-09-18 (×2): 5 mg via ORAL
  Filled 2014-09-17 (×2): qty 1

## 2014-09-17 NOTE — Evaluation (Signed)
Physical Therapy Evaluation Patient Details Name: Chase Blackwell MRN: [id number] DOB: [birth year] Today's Date: 09/17/2014   History of Present Illness  49 y.o. male with past medical history of hypertension, asthma, GERD, schizophrenia, alcohol abuse, seizures, recurrent pancreatitis, pancreatic mass, depression, CAD who was sent by [facility] in [place], a nursing home facility for evaluation due to change in mental status  Clinical Impression  Patient demonstrates deficits in functional mobility as indicated below. Will need continued skilled PT to address deficits and maximize function. Will see as indicated and progress as tolerated.    Follow Up Recommendations Return to SNF     Equipment Recommendations  None recommended by PT    Recommendations for Other Services       Precautions / Restrictions Precautions Precautions: Fall      Mobility  Bed Mobility Overal bed mobility: Needs Assistance Bed Mobility: Supine to Sit;Sit to Supine     Supine to sit: Min guard Sit to supine: Min guard   General bed mobility comments: Increased time, cues required, no physical assist, guard for afety  Transfers Overall transfer level: Needs assistance Equipment used: None Transfers: Sit to/from Stand Sit to Stand: Min guard         General transfer comment: No physical assist, min guard for safety  Ambulation/Gait Ambulation/Gait assistance: Min guard;Min assist Ambulation Distance (Feet): 140 Feet Assistive device: None Gait Pattern/deviations: Step-through pattern;Decreased stride length;Drifts right/left Gait velocity: decreased Gait velocity interpretation: Below normal speed for age/gender General Gait Details: steady with gait, decreased gait speed  Stairs            Wheelchair Mobility    Modified Rankin (Stroke Patients Only)       Balance Overall balance assessment: History of Falls                                            Pertinent Vitals/Pain Pain Assessment: Faces Faces Pain Scale: Hurts little more Pain Location: head/stomach Pain Descriptors / Indicators: Discomfort Pain Intervention(s): Monitored during session    Home Living Family/patient expects to be discharged to:: Skilled nursing facility                 Additional Comments: no family present during session- unsure if pt lives with his brother or he is homeless?    Prior Function Level of Independence: Independent               Hand Dominance   Dominant Hand: Right    Extremity/Trunk Assessment   Upper Extremity Assessment: Overall WFL for tasks assessed           Lower Extremity Assessment: Generalized weakness         Communication   Communication: No difficulties  Cognition Arousal/Alertness: Awake/alert Behavior During Therapy: Flat affect Overall Cognitive Status: Impaired/Different from baseline Area of Impairment: Attention;Following commands;Safety/judgement;Awareness;Problem solving;Orientation Orientation Level: Disoriented to;Place;Situation (required multiple choice clues for orientation) Current Attention Level: Sustained   Following Commands: Follows one step commands with increased time Safety/Judgement: Decreased awareness of safety;Decreased awareness of deficits Awareness: Intellectual Problem Solving: Slow processing;Difficulty sequencing;Requires verbal cues;Requires tactile cues General Comments: Patient perseverating through various tasks. Attempted to wash hands at sink, patient required cues for task, then tried to walk away with hands still full of soap. When cued to dry hands, patient could not stop wiping with papertowels. Patient with soe apraxic  behaviors throughout session.    General Comments General comments (skin integrity, edema, etc.): unable to maintain single leg stance without UE support via rail    Exercises        Assessment/Plan    PT Assessment Patient  needs continued PT services  PT Diagnosis Difficulty walking;Altered mental status   PT Problem List Decreased strength;Decreased activity tolerance;Decreased balance;Decreased coordination;Decreased cognition;Decreased safety awareness  PT Treatment Interventions DME instruction;Gait training;Functional mobility training;Therapeutic activities;Therapeutic exercise;Balance training;Cognitive remediation;Patient/family education   PT Goals (Current goals can be found in the Care Plan section) Acute Rehab PT Goals Patient Stated Goal: none stated PT Goal Formulation: With patient Time For Goal Achievement: 10/01/14 Potential to Achieve Goals: Fair    Frequency Min 2X/week   Barriers to discharge        Co-evaluation               End of Session Equipment Utilized During Treatment: Gait belt Activity Tolerance: No increased pain Patient left: in bed;with call bell/phone within reach;with bed alarm set;with nursing/sitter in room Nurse Communication: Mobility status         Time: 0109-3235 PT Time Calculation (min) (ACUTE ONLY): 18 min   Charges:   PT Evaluation $Initial PT Evaluation Tier I: 1 Procedure     PT G CodesDuncan Dull 09-22-14, 5:28 PM Alben Deeds, [place] DPT  [phone]

## 2014-09-17 NOTE — Progress Notes (Signed)
[facility] PROGRESS NOTE  Ahnaf Caponi Thilges VFI:[id number] DOB: 18-Mar-1965 DOA: 09/16/2014 PCP: [name], MD  Assessment/Plan:  Principal Problem:   Acute encephalopathy: much improved. Suspect polypharmacy. Will resume keppra and depakote at lower dose. No fever or leukocytosis. Doubt sepsis. Will observe off abx. keppra level pending Active Problems:   PARANOID SCHIZOPHRENIA, CHRONIC   Alcohol abuse   Essential hypertension   Seizures   CAD (coronary artery disease)   Anemia   Pancreatic neoplasm: will need reschedule of onc f/u. Resume oxycodone q12 h prn. Watch for sedation  HPI/Subjective: C/o lower abd pain. Hunger. No cough, f/c  Objective: Filed Vitals:   09/17/14 0929  BP: 108/63  Pulse: 81  Temp: 97.6 F (36.4 C)  Resp: 18    Intake/Output Summary (Last 24 hours) at 09/17/14 1153 Last data filed at 09/17/14 0843  Gross per 24 hour  Intake      0 ml  Output      0 ml  Net      0 ml   Filed Weights   09/16/14 0410  Weight: 63.504 kg (140 lb)    Exam:   General:  Alert, chronically ill appearing. Cooperative  HEENT: scleral icterus  Cardiovascular: RRR without MGR  Respiratory: CTA without WRR  Abdomen: S, NT, ND  Ext: no CCE. No asterixis. No tremor  Basic Metabolic Panel:  Recent Labs Lab 09/11/14 0548 09/12/14 0547 09/14/14 0533 09/16/14 0318 09/16/14 1132 09/17/14 0708  NA 133* 135 137 132*  --  137  K 4.1 4.1 3.7 3.7  --  3.6  CL 100* 101 100* 97*  --  107  CO2 25 23 27 24   --  22  GLUCOSE 77 76 107* 111*  --  86  BUN 11 6 <5* 13  --  10  CREATININE 0.67 0.66 0.60* 0.67  --  0.69  CALCIUM 8.7* 8.7* 9.4 9.3  --  8.1*  MG  --   --   --   --  1.8  --    Liver Function Tests:  Recent Labs Lab 09/11/14 0548 09/12/14 0547 09/14/14 0533 09/16/14 0318 09/17/14 0708  AST 53* 45* 48* 117* 45*  ALT 63 61 63 125* 73*  ALKPHOS 736* 754* 746* 782* 508*  BILITOT 7.2* 7.4* 5.5* 6.6* 3.8*  PROT 6.2* 6.5 6.7 6.6 5.1*   ALBUMIN 2.7* 2.8* 2.9* 2.8* 2.0*    Recent Labs Lab 09/16/14 0318  LIPASE 36    Recent Labs Lab 09/11/14 0548 09/16/14 0318  AMMONIA 65* 38*   CBC:  Recent Labs Lab 09/11/14 0548 09/12/14 0547 09/14/14 0533 09/16/14 0318 09/17/14 0708  WBC 6.0 4.7 7.6 5.9 4.0  NEUTROABS  --   --   --  5.1  --   HGB 9.9* 9.9* 10.3* 10.4* 9.8*  HCT 29.3* 29.5* 29.5* 30.3* 28.6*  MCV 91.0 91.6 91.6 89.6 90.2  PLT 314 299 278 247 190   Cardiac Enzymes: No results for input(s): CKTOTAL, CKMB, CKMBINDEX, TROPONINI in the last 168 hours. BNP (last 3 results)  Recent Labs  05/01/14 2153 06/06/14 2206  BNP 21.0 28.5    ProBNP (last 3 results) No results for input(s): PROBNP in the last 8760 hours.  CBG:  Recent Labs Lab 09/11/14 0739 09/12/14 0743 09/13/14 0739 09/14/14 0828 09/16/14 0320  GLUCAP 72 76 83 102* 101*    Recent Results (from the past 240 hour(s))  Surgical pcr screen     Status: None   Collection  Time: 09/13/14  7:40 AM  Result Value Ref Range Status   MRSA, PCR NEGATIVE NEGATIVE Final   Staphylococcus aureus NEGATIVE NEGATIVE Final    Comment:        The Xpert SA Assay (FDA approved for NASAL specimens in patients over 48 years of age), is one component of a comprehensive surveillance program.  Test performance has been validated by [facility] for patients greater than or equal to 29 year old. It is not intended to diagnose infection nor to guide or monitor treatment.      Studies: Dg Chest 2 View  09/16/2014   CLINICAL DATA:  Altered mental status.  Tachycardia.  Onset today.  EXAM: CHEST  2 VIEW  COMPARISON:  08/02/2014  FINDINGS: The cardiomediastinal contours are normal. Lung volumes are low. Pulmonary vasculature is normal. No consolidation, pleural effusion, or pneumothorax. No acute osseous abnormalities are seen.  IMPRESSION: No acute pulmonary process.   Electronically Signed   By: Jeb Levering M.D.   On: 09/16/2014 06:23   Ct  Head Wo Contrast  09/16/2014   CLINICAL DATA:  Altered mental status, disoriented and lethargic.  EXAM: CT HEAD WITHOUT CONTRAST  TECHNIQUE: Contiguous axial images were obtained from the base of the skull through the vertex without intravenous contrast.  COMPARISON:  06/18/2014  FINDINGS: Generalized cerebral and cerebellar volume loss, unchanged.No intracranial hemorrhage, mass effect, or midline shift. No hydrocephalus. The basilar cisterns are patent. No evidence of territorial infarct. No intracranial fluid collection. Calvarium is intact. Included paranasal sinuses and mastoid air cells are well aerated.  IMPRESSION: Generalized volume loss without acute intracranial abnormality.   Electronically Signed   By: Jeb Levering M.D.   On: 09/16/2014 05:18    Scheduled Meds: . divalproex  125 mg Oral Q12H  . enoxaparin (LOVENOX) injection  40 mg Subcutaneous Daily  . feeding supplement (ENSURE ENLIVE)  237 mL Oral BID BM  . folic acid  1 mg Oral Daily  . levETIRAcetam  500 mg Oral BID  . lipase/protease/amylase  36,000 Units Oral TID AC  . nicotine  21 mg Transdermal Q0600  . nicotine  21 mg Transdermal Daily   Continuous Infusions: . dextrose 5 % and 0.9 % NaCl with KCl 20 mEq/L 100 mL/hr at 09/17/14 1106    Time spent: 25 minutes  [place] [facility] [email or web address], password [email or web address] 09/17/2014, 11:53 AM  LOS: 1 day

## 2014-09-17 NOTE — Progress Notes (Signed)
Patient is complaining of pain in abdominal region bowl sounds are present, he passing gas pain is described as greater in lower intestinal region. Patient received one time morphine around 0400 9/16 and again asking for pain medication at 0800. Will continue to monitor.

## 2014-09-17 NOTE — Clinical Social Work Note (Signed)
Clinical Social Work Assessment  Patient Details  Name: Chase Blackwell MRN: [id number] Date of Birth: 1965-11-23  Date of referral:  09/17/14               Reason for consult:  Facility Placement ([place] )                Permission sought to share information with:  Facility Sport and exercise psychologist Hilda Blades [phone])  Housing/Transportation Living arrangements for the past 2 months:  [place] of Information:  Facility Patient Interpreter Needed:  None Criminal Activity/Legal Involvement Pertinent to Current Situation/Hospitalization:  No - Comment as needed Significant Relationships:  Siblings Lives with:  Facility Resident Do you feel safe going back to the place where you live?  Yes Need for family participation in patient care:  Yes (Comment)  Care giving concerns: N/A   Social Worker assessment / plan:  CSW left a voice message for the pt's brother Elenore Rota. CSW spoke with [place] from [place] at [place]. Hilda Blades confirmed that the pt is a short-term pt who may turn long-term. Hilda Blades reported that she will accepted the pt back. CSW completed FL2 and uploaded the clinicals to [place].     Employment status:  Disabled (Comment on whether or not currently receiving Disability) Insurance information:  Medicaid In [place], [place] PT Recommendations:  [place] / Referral to community resources:   (N/A)  Patient/Family's Response to care:  N/A  Patient/Family's Understanding of and Emotional Response to Diagnosis, Current Treatment, and Prognosis: N/A  Emotional Assessment Appearance:  Appears stated age Attitude/Demeanor/Rapport:  Irrational Affect (typically observed):  Unable to Assess, Irritable Orientation:  Oriented to Place, Oriented to  Time, Oriented to Self Alcohol / Substance use:  Illicit Drugs, Alcohol Use, Tobacco Use Psych involvement (Current and /or in the community):  No (Comment)  Discharge Needs   Concerns to be addressed:  Denies Needs/Concerns at this time Readmission within the last 30 days:  Yes Current discharge risk:  Substance Abuse Barriers to Discharge:  No Barriers Identified   Nam Vossler, LCSW 09/17/2014, 11:20 AM

## 2014-09-17 NOTE — Progress Notes (Signed)
Initial Nutrition Assessment  DOCUMENTATION CODES:   Non-severe (moderate) malnutrition in context of chronic illness  INTERVENTION:  Provide Ensure Enlive po BID, each supplement provides 350 kcal and 20 grams of protein Calorie Count   NUTRITION DIAGNOSIS:   Malnutrition related to chronic illness as evidenced by severe depletion of muscle mass, moderate depletion of body fat.   GOAL:   Patient will meet greater than or equal to 90% of their needs   MONITOR:   PO intake, Supplement acceptance, Labs, Weight trends, Skin  REASON FOR ASSESSMENT:   Consult Calorie Count  ASSESSMENT:   49 y.o. male with past medical history of hypertension, asthma, GERD, schizophrenia, alcohol abuse, seizures, recurrent pancreatitis, pancreatic mass, depression, CAD who was sent by [facility] in [place], a nursing home facility for evaluation due to change in mental status.   RD familiar to nutrition team from previous admissions. Pt NPO at time of visit, now advanced to a heart healthy diet. Pt reports having a good appetite and eating well PTA. Pt states that he has continued to drink Ensure supplements twice daily while at SNF. He is unsure of usual body weight. Per weight history, pt's weight has been fairly stable for the past 2 months.   Labs: low calcium, high alkaline phosphatase, high bilirubin, low hemoglobin  Diet Order:  Diet Heart Room service appropriate?: Yes; Fluid consistency:: Thin  Skin:  Reviewed, no issues  Last BM:  9/10  Height:   Ht Readings from Last 1 Encounters:  09/10/14 6' (1.829 m)    Weight:   Wt Readings from Last 1 Encounters:  09/16/14 140 lb (63.504 kg)    Ideal Body Weight:  80.9 kg  BMI:  Body mass index is 18.98 kg/(m^2).  Estimated Nutritional Needs:   Kcal:  2000-2200  Protein:  95-105 grams  Fluid:  2-2.2 L/day  EDUCATION NEEDS:   No education needs identified at this time  [place], LDN Inpatient Clinical  Dietitian Pager: [phone] After Hours Pager: [phone]

## 2014-09-17 NOTE — Care Management Note (Signed)
Case Management Note  Patient Details  Name: Chase Blackwell MRN: [id number] Date of Birth: 22-May-1965  Subjective/Objective:                    Action/Plan: Pt admitted with a change in his mental status from [name]. CM will continue to follow for discharge needs.   Expected Discharge Date:                  Expected Discharge Plan:  [place]  In-House Referral:     Discharge planning Services     Post Acute Care Choice:    Choice offered to:     DME Arranged:    DME Agency:     HH Arranged:    [place] Agency:     Status of Service:  In process, will continue to follow  Medicare Important Message Given:    Date Medicare IM Given:    Medicare IM give by:    Date Additional Medicare IM Given:    Additional Medicare Important Message give by:     If discussed at [place] of Stay Meetings, dates discussed:    Additional Comments:  Ollen Gross, RN 09/17/2014, 3:38 PM

## 2014-09-18 ENCOUNTER — Inpatient Hospital Stay (HOSPITAL_COMMUNITY)
Admit: 2014-09-18 | Discharge: 2014-09-18 | Disposition: A | Discharge status: Home / Self Care | Payer: Medicare Other | Attending: Internal Medicine | Admitting: Internal Medicine

## 2014-09-18 LAB — PROCALCITONIN: Procalcitonin: 1.98 ng/mL

## 2014-09-18 LAB — LEVETIRACETAM LEVEL: LEVETIRACETAM: 5.8 ug/mL — AB (ref 10.0–40.0)

## 2014-09-18 MED ORDER — ENSURE ENLIVE PO LIQD
237.0000 mL | Freq: Three times a day (TID) | ORAL | Status: DC
  Administered 2014-09-18 – 2014-09-20 (×4): 237 mL via ORAL
  Filled 2014-09-18 (×10): qty 237

## 2014-09-18 MED ORDER — LORAZEPAM 0.5 MG PO TABS: 0.5000 mg | ORAL_TABLET | Freq: Every day | ORAL | Status: DC

## 2014-09-18 MED ORDER — LORAZEPAM 0.5 MG PO TABS
0.5000 mg | ORAL_TABLET | Freq: Every evening | ORAL | Status: DC | PRN
Start: 2014-09-18 — End: 2014-09-20
  Administered 2014-09-20: 0.5 mg via ORAL
  Filled 2014-09-18: qty 1

## 2014-09-18 MED ORDER — BOOST / RESOURCE BREEZE PO LIQD
1.0000 | ORAL | Status: DC
  Administered 2014-09-20: 1 via ORAL
  Filled 2014-09-18 (×3): qty 1

## 2014-09-18 MED ORDER — LORAZEPAM 2 MG/ML IJ SOLN
1.0000 mg | Freq: Once | INTRAMUSCULAR | Status: AC
  Administered 2014-09-18: 1 mg via INTRAVENOUS
  Filled 2014-09-18: qty 1

## 2014-09-18 MED ORDER — LEVETIRACETAM 250 MG PO TABS
250.0000 mg | ORAL_TABLET | Freq: Two times a day (BID) | ORAL | Status: DC
  Administered 2014-09-18 – 2014-09-20 (×5): 250 mg via ORAL
  Filled 2014-09-18 (×5): qty 1

## 2014-09-18 NOTE — Progress Notes (Signed)
EEG Completed; Results Pending  

## 2014-09-18 NOTE — Progress Notes (Addendum)
[facility] PROGRESS NOTE  Chase Blackwell EXN:[id number] DOB: 31-Jul-1965 DOA: 09/16/2014 PCP: [name], MD  Assessment/Plan:  Principal Problem:   Acute encephalopathy: improved yesterday, now lethargic again. Per RN, oriented to self only. Will d/c depakote. keppra dose already decreased. Change oxycodone (only 5 q12 prn) to ultram prn. Check EEG, MRI brain with and without contrast. Continue sitter. Has h/o previous MI with hypoxic brain injury, but per Dr. Broadus John, ambulating and conversant at baseline.  Afebrile off abx and cx remain negative. Ammonia not bad. TSH minimally elevated. Will check FT4, FT3. Recent B12, folate ok. CT brain ok. Active Problems:   PARANOID SCHIZOPHRENIA, CHRONIC   Alcohol abuse   Essential hypertension   Seizures   CAD (coronary artery disease)   Anemia   Pancreatic neoplasm: will need reschedule of onc f/u. Marland Kitchen Watch for sedation  HPI/Subjective: Unable. Per sitter, confused and was trying to get clothes on to leave and smoke a cigarette. Took a few bites of breakfast  Objective: Filed Vitals:   09/18/14 0523  BP: 106/65  Pulse: 84  Temp: 97.6 F (36.4 C)  Resp: 18    Intake/Output Summary (Last 24 hours) at 09/18/14 0943 Last data filed at 09/17/14 1750  Gross per 24 hour  Intake    900 ml  Output      0 ml  Net    900 ml   Filed Weights   09/16/14 0410  Weight: 63.504 kg (140 lb)    Exam:   General:  Asleep. Briefly arousable. Falls back asleep. Wont answer questions or follow commands.  HEENT: scleral icterus  Cardiovascular: RRR without MGR  Respiratory: CTA without WRR  Abdomen: S, NT, ND  Ext: no CCE. No asterixis. No tremor  Basic Metabolic Panel:  Recent Labs Lab 09/12/14 0547 09/14/14 0533 09/16/14 0318 09/16/14 1132 09/17/14 0708  NA 135 137 132*  --  137  K 4.1 3.7 3.7  --  3.6  CL 101 100* 97*  --  107  CO2 23 27 24   --  22  GLUCOSE 76 107* 111*  --  86  BUN 6 <5* 13  --  10  CREATININE 0.66  0.60* 0.67  --  0.69  CALCIUM 8.7* 9.4 9.3  --  8.1*  MG  --   --   --  1.8  --    Liver Function Tests:  Recent Labs Lab 09/12/14 0547 09/14/14 0533 09/16/14 0318 09/17/14 0708  AST 45* 48* 117* 45*  ALT 61 63 125* 73*  ALKPHOS 754* 746* 782* 508*  BILITOT 7.4* 5.5* 6.6* 3.8*  PROT 6.5 6.7 6.6 5.1*  ALBUMIN 2.8* 2.9* 2.8* 2.0*    Recent Labs Lab 09/16/14 0318  LIPASE 36    Recent Labs Lab 09/16/14 0318  AMMONIA 38*   CBC:  Recent Labs Lab 09/12/14 0547 09/14/14 0533 09/16/14 0318 09/17/14 0708  WBC 4.7 7.6 5.9 4.0  NEUTROABS  --   --  5.1  --   HGB 9.9* 10.3* 10.4* 9.8*  HCT 29.5* 29.5* 30.3* 28.6*  MCV 91.6 91.6 89.6 90.2  PLT 299 278 247 190   Cardiac Enzymes: No results for input(s): CKTOTAL, CKMB, CKMBINDEX, TROPONINI in the last 168 hours. BNP (last 3 results)  Recent Labs  05/01/14 2153 06/06/14 2206  BNP 21.0 28.5    ProBNP (last 3 results) No results for input(s): PROBNP in the last 8760 hours.  CBG:  Recent Labs Lab 09/12/14 [phone] 09/13/14 0739 09/14/14 5916  09/16/14 0320  GLUCAP 76 83 102* 101*    Recent Results (from the past 240 hour(s))  Surgical pcr screen     Status: None   Collection Time: 09/13/14  7:40 AM  Result Value Ref Range Status   MRSA, PCR NEGATIVE NEGATIVE Final   Staphylococcus aureus NEGATIVE NEGATIVE Final    Comment:        The Xpert SA Assay (FDA approved for NASAL specimens in patients over 32 years of age), is one component of a comprehensive surveillance program.  Test performance has been validated by [facility] for patients greater than or equal to 11 year old. It is not intended to diagnose infection nor to guide or monitor treatment.   Blood culture (routine x 2)     Status: None (Preliminary result)   Collection Time: 09/16/14  3:41 AM  Result Value Ref Range Status   Specimen Description BLOOD RIGHT ANTECUBITAL  Final   Special Requests BOTTLES DRAWN AEROBIC AND ANAEROBIC 10MLS   Final   Culture NO GROWTH 1 DAY  Final   Report Status PENDING  Incomplete  Blood culture (routine x 2)     Status: None (Preliminary result)   Collection Time: 09/16/14  4:05 AM  Result Value Ref Range Status   Specimen Description BLOOD LEFT ANTECUBITAL  Final   Special Requests BOTTLES DRAWN AEROBIC AND ANAEROBIC 2.5CC  Final   Culture NO GROWTH 1 DAY  Final   Report Status PENDING  Incomplete     Studies: No results found.  Scheduled Meds: . divalproex  125 mg Oral Q12H  . enoxaparin (LOVENOX) injection  40 mg Subcutaneous Daily  . feeding supplement (ENSURE ENLIVE)  237 mL Oral BID BM  . folic acid  1 mg Oral Daily  . levETIRAcetam  500 mg Oral BID  . lipase/protease/amylase  36,000 Units Oral TID AC  . nicotine  21 mg Transdermal Q0600   Continuous Infusions: . dextrose 5 % and 0.9 % NaCl with KCl 20 mEq/L 100 mL/hr at 09/18/14 0630    Time spent: 25 minutes  [place] [facility] [email or web address], password [facility] 09/18/2014, 9:43 AM  LOS: 2 days

## 2014-09-18 NOTE — Progress Notes (Signed)
Calorie Count Note  48 hour calorie count ordered.  Diet: Heart Healthy Supplements: Ensure Enlive BID  Breakfast: 73 kcal, 2 grams protein Lunch: 137 kcal, 4 grams protein Dinner: 158 kcal, 5 grams protein Supplements: 350 kcal, 20 grams protein  Total intake: 718 kcal (36% of minimum estimated needs)  31 grams protein (33% of minimum estimated needs)  Pt is eating 25% or less of meals. Per nurse tech, pt told family that he can't eat the food here, it doesn't taste good. Pt drank Ensure yesterday but, declined it after breakfast this morning. Pt asleep at time of visit.   Nutrition Dx: Malnutrition related to chronic illness as evidenced by severe depletion of muscle mass, moderate depletion of body fat.  Goal: Pt to meet >/= 90% of their estimated nutrition needs; Unmet  Intervention:  Recommend liberalizing diet to regular Increase Ensure Enlive toTID Provide Boost Breeze once daily Provide snacks BID  Scarlette Ar RD, LDN Inpatient Clinical Dietitian Pager: [phone] After Hours Pager: [phone]

## 2014-09-18 NOTE — Procedures (Signed)
EEG report.  Brief clinical history: 49 y.o. male with past medical history of hypertension, asthma, GERD, schizophrenia, alcohol abuse, seizures, recurrent pancreatitis, pancreatic mass, depression, CAD who was sent by [facility] in [place], a nursing home facility for evaluation due to change in mental status.  Technique: this is a 17 channel routine scalp EEG performed at the bedside with bipolar and monopolar montages arranged in accordance to the international 10/20 system of electrode placement. One channel was dedicated to EKG recording.  The study was performed predominantly during drowsiness, and stage 2 sleep. No activating procedures performed.  Description: as the study opens, patient falls rapidly into the drowsy state and subsequently the recording is dominated by normal sleep architecture without evidence of intermixed epileptiform discharges. No clinical events suggestive of seizures noted. EKG showed sinus rhythm.  Impression: this is a normal drowsy and asleep EEG. Please, be aware that a normal EEG does not exclude the possibility of epilepsy.  Clinical correlation is advised.   Dorian Pod, MD [facility]

## 2014-09-18 NOTE — Clinical Social Work Note (Signed)
CSW reviewed chart. Once pt is medically stable he will return back to [place].   [place], MSW, [place]

## 2014-09-19 ENCOUNTER — Encounter (HOSPITAL_COMMUNITY): Payer: Self-pay | Admitting: *Deleted

## 2014-09-19 ENCOUNTER — Inpatient Hospital Stay (HOSPITAL_COMMUNITY): Payer: Medicare Other

## 2014-09-19 DIAGNOSIS — I10 Essential (primary) hypertension: Secondary | ICD-10-CM

## 2014-09-19 DIAGNOSIS — G9340 Encephalopathy, unspecified: Principal | ICD-10-CM

## 2014-09-19 DIAGNOSIS — E440 Moderate protein-calorie malnutrition: Secondary | ICD-10-CM | POA: Insufficient documentation

## 2014-09-19 DIAGNOSIS — F200 Paranoid schizophrenia: Secondary | ICD-10-CM

## 2014-09-19 LAB — GAMMA GT: GGT: 220 U/L — ABNORMAL HIGH (ref 7–50)

## 2014-09-19 LAB — COMPREHENSIVE METABOLIC PANEL
ALBUMIN: 2.1 g/dL — AB (ref 3.5–5.0)
ALK PHOS: 339 U/L — AB (ref 38–126)
ALT: 48 U/L (ref 17–63)
ANION GAP: 7 (ref 5–15)
AST: 28 U/L (ref 15–41)
CALCIUM: 8.3 mg/dL — AB (ref 8.9–10.3)
CO2: 24 mmol/L (ref 22–32)
Chloride: 104 mmol/L (ref 101–111)
Creatinine, Ser: 0.42 mg/dL — ABNORMAL LOW (ref 0.61–1.24)
GFR calc Af Amer: >60 mL/min (ref >60)
GFR calc non Af Amer: >60 mL/min (ref >60)
GLUCOSE: 148 mg/dL — AB (ref 65–99)
Potassium: 3.6 mmol/L (ref 3.5–5.1)
SODIUM: 135 mmol/L (ref 135–145)
Total Bilirubin: 2.8 mg/dL — ABNORMAL HIGH (ref 0.3–1.2)
Total Protein: 5.7 g/dL — ABNORMAL LOW (ref 6.5–8.1)

## 2014-09-19 LAB — AMMONIA: Ammonia: 30 umol/L (ref 9–35)

## 2014-09-19 LAB — T4, FREE: Free T4: 1.14 ng/dL — ABNORMAL HIGH (ref 0.61–1.12)

## 2014-09-19 MED ORDER — QUETIAPINE FUMARATE 50 MG PO TABS
50.0000 mg | ORAL_TABLET | Freq: Every day | ORAL | Status: DC
  Administered 2014-09-19: 50 mg via ORAL
  Filled 2014-09-19: qty 1

## 2014-09-19 MED ORDER — TRAMADOL HCL 50 MG PO TABS
50.0000 mg | ORAL_TABLET | Freq: Once | ORAL | Status: AC
  Administered 2014-09-19: 50 mg via ORAL
  Filled 2014-09-19: qty 1

## 2014-09-19 MED ORDER — DOCUSATE SODIUM 100 MG PO CAPS
200.0000 mg | ORAL_CAPSULE | Freq: Two times a day (BID) | ORAL | Status: DC
  Administered 2014-09-19 – 2014-09-20 (×2): 200 mg via ORAL
  Filled 2014-09-19 (×2): qty 2

## 2014-09-19 MED ORDER — DICYCLOMINE HCL 10 MG PO CAPS
10.0000 mg | ORAL_CAPSULE | Freq: Three times a day (TID) | ORAL | Status: DC
  Administered 2014-09-19: 10 mg via ORAL
  Filled 2014-09-19 (×3): qty 1

## 2014-09-19 MED ORDER — LACTULOSE 10 GM/15ML PO SOLN
10.0000 g | Freq: Two times a day (BID) | ORAL | Status: DC
  Administered 2014-09-19 – 2014-09-20 (×3): 10 g via ORAL
  Filled 2014-09-19 (×3): qty 15

## 2014-09-19 MED ORDER — GADOBENATE DIMEGLUMINE 529 MG/ML IV SOLN
15.0000 mL | Freq: Once | INTRAVENOUS | Status: AC
  Administered 2014-09-19: 13 mL via INTRAVENOUS

## 2014-09-19 MED ORDER — OXYCODONE HCL 5 MG PO TABS
5.0000 mg | ORAL_TABLET | Freq: Four times a day (QID) | ORAL | Status: DC | PRN
  Administered 2014-09-19 – 2014-09-20 (×4): 5 mg via ORAL
  Filled 2014-09-19 (×5): qty 1

## 2014-09-19 NOTE — Progress Notes (Signed)
[facility] PROGRESS NOTE  Chase Blackwell XLK:[id number] DOB: 1965/06/30 DOA: 09/16/2014 PCP: [name], MD  Assessment/Plan:    Acute encephalopathy: improving -d/c depakote,  keppra dose already decreased -Change oxycodone (only 5 q12 prn) to ultram prn.  - EEG ok -MRI brain with and without contrast -d/c sitter - Has h/o previous MI with hypoxic brain injury, but per Dr. Broadus John at discharge,  ambulating and conversant at baseline.   -Afebrile off abx and cx remain negative- pro calcitonin trending down -Recent B12, folate ok. CT brain ok.    PARANOID SCHIZOPHRENIA, CHRONIC   Alcohol abuse   Essential hypertension   Seizures   CAD (coronary artery disease)   Anemia   Pancreatic neoplasm: will need reschedule of onc f/u. 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 No asterixis. No tremor  Basic Metabolic Panel:  Recent Labs Lab 09/14/14 0533 09/16/14 0318 09/16/14 1132 09/17/14 0708 09/19/14 0758  NA 137 132*  --  137 135  K 3.7 3.7  --  3.6 3.6  CL 100* 97*  --  107 104  CO2 27 24  --  22 24  GLUCOSE 107* 111*  --  86 148*  BUN <5* 13  --  10 <5*  CREATININE 0.60* 0.67  --  0.69 0.42*  CALCIUM 9.4 9.3  --  8.1* 8.3*  MG  --   --  1.8  --   --    Liver Function Tests:  Recent Labs Lab  09/14/14 0533 09/16/14 0318 09/17/14 0708 09/19/14 0758  AST 48* 117* 45* 28  ALT 63 125* 73* 48  ALKPHOS 746* 782* 508* 339*  BILITOT 5.5* 6.6* 3.8* 2.8*  PROT 6.7 6.6 5.1* 5.7*  ALBUMIN 2.9* 2.8* 2.0* 2.1*    Recent Labs Lab 09/16/14 0318  LIPASE 36    Recent Labs Lab 09/16/14 0318 09/19/14 0758  AMMONIA 38* 30   CBC:  Recent Labs Lab 09/14/14 0533 09/16/14 0318 09/17/14 0708  WBC 7.6 5.9 4.0  NEUTROABS  --  5.1  --   HGB 10.3* 10.4* 9.8*  HCT 29.5* 30.3* 28.6*  MCV 91.6 89.6 90.2  PLT 278 247 190   Cardiac Enzymes: No results for input(s): CKTOTAL, CKMB, CKMBINDEX, TROPONINI in the last 168 hours. BNP (last 3 results)  Recent Labs  05/01/14 2153 06/06/14 2206  BNP 21.0 28.5    ProBNP (last 3 results) No results for input(s): PROBNP in the last 8760 hours.  CBG:  Recent Labs Lab 09/13/14 0739 09/14/14 0828 09/16/14 0320  GLUCAP 83 102* 101*    Recent Results (from the past 240 hour(s))  Surgical pcr screen     Status: None  Collection Time: 09/13/14  7:40 AM  Result Value Ref Range Status   MRSA, PCR NEGATIVE NEGATIVE Final   Staphylococcus aureus NEGATIVE NEGATIVE Final    Comment:        The Xpert SA Assay (FDA approved for NASAL specimens in patients over 42 years of age), is one component of a comprehensive surveillance program.  Test performance has been validated by [facility] for patients greater than or equal to 56 year old. It is not intended to diagnose infection nor to guide or monitor treatment.   Blood culture (routine x 2)     Status: None (Preliminary result)   Collection Time: 09/16/14  3:41 AM  Result Value Ref Range Status   Specimen Description BLOOD RIGHT ANTECUBITAL  Final   Special Requests BOTTLES DRAWN AEROBIC AND ANAEROBIC 10MLS  Final   Culture NO GROWTH 2 DAYS  Final   Report Status PENDING  Incomplete  Blood culture (routine x 2)     Status: None (Preliminary result)   Collection Time: 09/16/14   4:05 AM  Result Value Ref Range Status   Specimen Description BLOOD LEFT ANTECUBITAL  Final   Special Requests BOTTLES DRAWN AEROBIC AND ANAEROBIC 2.5CC  Final   Culture NO GROWTH 2 DAYS  Final   Report Status PENDING  Incomplete     Studies: No results found.  Scheduled Meds: . enoxaparin (LOVENOX) injection  40 mg Subcutaneous Daily  . feeding supplement  1 Container Oral Q24H  . feeding supplement (ENSURE ENLIVE)  237 mL Oral TID BM  . folic acid  1 mg Oral Daily  . lactulose  10 g Oral BID  . levETIRAcetam  250 mg Oral BID  . lipase/protease/amylase  36,000 Units Oral TID AC  . nicotine  21 mg Transdermal Q0600   Continuous Infusions:    Time spent: 25 minutes  Chase Blackwell  [facility] [email or web address], password [facility] 09/19/2014, 10:11 AM  LOS: 3 days

## 2014-09-19 NOTE — Care Management Important Message (Signed)
Important Message  Patient Details  Name: Chase Blackwell MRN: [id number] Date of Birth: [birth year]   Medicare Important Message Given:  Yes-second notification given    Delorse Lek 09/19/2014, 8:43 AM

## 2014-09-19 NOTE — Progress Notes (Signed)
Patient's brother [name] called to request that the patient be discharged "after 3 or 4" so that he can bring his mother by to see the patient before he leaves. Will pass along. Seryna Marek, Rande Brunt, RN

## 2014-09-19 NOTE — Progress Notes (Signed)
Physical Therapy Treatment Patient Details Name: Chase Blackwell MRN: [id number] DOB: March 15, 1965 Today's Date: 09/19/2014    History of Present Illness 49 y.o. male with past medical history of hypertension, asthma, GERD, schizophrenia, alcohol abuse, seizures, recurrent pancreatitis, pancreatic mass, depression, CAD who was sent by [facility] in [place], a nursing home facility for evaluation due to change in mental status    PT Comments    Patient limited by pain during ambulation.  Demonstrated high fall risk per Dynamic Gait Index score 15/24.  Will benefit from further skilled PT in this setting and after d/c for continued balance, gait and core strengthening.  Follow Up Recommendations  SNF     Equipment Recommendations  None recommended by PT    Recommendations for Other Services       Precautions / Restrictions Precautions Precautions: Fall    Mobility  Bed Mobility Overal bed mobility: Modified Independent             General bed mobility comments: cues at end for positioning in bed  Transfers   Equipment used: None   Sit to Stand: Modified independent (Device/Increase time)            Ambulation/Gait Ambulation/Gait assistance: Supervision Ambulation Distance (Feet): 200 Feet   Gait Pattern/deviations: Step-through pattern;Decreased stride length     General Gait Details: see DGI   Stairs Stairs: Yes Stairs assistance: Min guard Stair Management: Alternating pattern;Step to pattern;Forwards Number of Stairs: 10 General stair comments: ascending with alternating pattern; caught toe on edge of step twice, once grabbing rail to prevent loss of balance; step to sequence to descend.  Assist for safety  Wheelchair Mobility    Modified Rankin (Stroke Patients Only)       Balance     09/19/14 1433  Standardized Balance Assessment  Standardized Balance Assessment  Dynamic Gait Index  Dynamic Gait Index  Level Surface 2  Change in Gait  Speed 1  Gait with Horizontal Head Turns 2  Gait with Vertical Head Turns 3  Gait and Pivot Turn 2  Step Over Obstacle 2  Step Around Obstacles 2  Steps 1  Total Score 15                                    Cognition Arousal/Alertness: Awake/alert Behavior During Therapy: WFL for tasks assessed/performed Overall Cognitive Status: No family/caregiver present to determine baseline cognitive functioning                      Exercises      General Comments        Pertinent Vitals/Pain Faces Pain Scale: Hurts even more Pain Location: stomach Pain Descriptors / Indicators: Grimacing Pain Intervention(s): Limited activity within patient's tolerance;Patient requesting pain meds-RN notified    Home Living                      Prior Function            PT Goals (current goals can now be found in the care plan section) Progress towards PT goals: Progressing toward goals    Frequency  Min 2X/week    PT Plan Current plan remains appropriate    Co-evaluation             End of Session Equipment Utilized During Treatment: Gait belt Activity Tolerance: Patient limited by pain Patient left: in bed;with  call bell/phone within reach;with nursing/sitter in room     Time: 1210-1225 PT Time Calculation (min) (ACUTE ONLY): 15 min  Charges:  $Gait Training: 8-22 mins                    G Codes:      WYNN,CYNDI 2014-10-14, 2:36 PM Magda Kiel, [place] 10-14-14

## 2014-09-19 NOTE — Progress Notes (Signed)
Calorie Count Note  48 hour calorie count ordered, now complete.   Diet: Heart Healthy Supplements: Ensure Enlive, Boost Breeze  Breakfast: 270 kcal, 24 grams of protein Lunch: 0 kcal, 0 grams protein Dinner: 98 kcal, 3 grams protein Supplements: 700 kcal, 40 grams protein  Total intake: 1068 kcal (53% of minimum estimated needs)  67 grams protein (71% of minimum estimated needs)  Pt did not eat lunch yesterday but, drank 2 bottles of Ensure instead. He drank 3 cartons of milk for breakfast but, ate very little. Only ate mashed potatoes for dinner. Energy and protein intake improved from yesterday with higher intake of nutritional supplements. Will continue supplements.   Nutrition Dx: Malnutrition related to chronic illness as evidenced by severe depletion of muscle mass, moderate depletion of body fat.  Goal: Pt to meet >/= 90% of their estimated nutrition needs   Intervention:  Recommend liberalizing diet to regular Continue Ensure Enlive po TID, each supplement provides 350 kcal and 20 grams of protein Continue Boost Breeze po once daily, each supplement provides 250 kcal and 9 grams of protein Continue snacks BID   Scarlette Ar RD, LDN Inpatient Clinical Dietitian Pager: [phone] After Hours Pager: [phone]

## 2014-09-19 NOTE — Progress Notes (Signed)
Sitter d/c at 1:30PM. Bed alarm on. Will continue to monitor

## 2014-09-20 ENCOUNTER — Encounter: Payer: Self-pay | Admitting: *Deleted

## 2014-09-20 DIAGNOSIS — F1010 Alcohol abuse, uncomplicated: Secondary | ICD-10-CM

## 2014-09-20 DIAGNOSIS — K869 Disease of pancreas, unspecified: Secondary | ICD-10-CM

## 2014-09-20 LAB — T3, FREE: T3 FREE: 2.5 pg/mL (ref 2.0–4.4)

## 2014-09-20 MED ORDER — LEVETIRACETAM 250 MG PO TABS: 250.0000 mg | ORAL_TABLET | Freq: Two times a day (BID) | ORAL | Status: AC

## 2014-09-20 MED ORDER — LACTULOSE 10 GM/15ML PO SOLN: 10.0000 g | Freq: Two times a day (BID) | ORAL | Status: AC

## 2014-09-20 MED ORDER — CLOPIDOGREL BISULFATE 75 MG PO TABS
75.0000 mg | ORAL_TABLET | Freq: Every day | ORAL | Status: DC
  Administered 2014-09-20: 75 mg via ORAL
  Filled 2014-09-20: qty 1

## 2014-09-20 MED ORDER — LORAZEPAM 0.5 MG PO TABS: ORAL_TABLET | ORAL | Status: AC

## 2014-09-20 MED ORDER — METOPROLOL TARTRATE 12.5 MG HALF TABLET
12.5000 mg | ORAL_TABLET | Freq: Two times a day (BID) | ORAL | Status: DC
  Administered 2014-09-20: 12.5 mg via ORAL
  Filled 2014-09-20: qty 1

## 2014-09-20 MED ORDER — BOOST / RESOURCE BREEZE PO LIQD: 1.0000 | ORAL | Status: AC

## 2014-09-20 MED ORDER — OXYCODONE HCL 5 MG PO TABS: 5.0000 mg | ORAL_TABLET | Freq: Four times a day (QID) | ORAL | Status: AC | PRN

## 2014-09-20 NOTE — Progress Notes (Signed)
Patient is discharged from room 5M13 at this time. Alert and in stable condition. IV site d/c'd. Report given to receiving nurse Haze Rushing, LPN at [facility]. Patient transported via stretcher by [facility] with all belongings at side.

## 2014-09-20 NOTE — Progress Notes (Signed)
Patient with significant stomach pain this evening. Pt wanting to smoke, getting dressed. Will not comply with bed alarm. [place]ocutt, Rande Brunt, RN

## 2014-09-20 NOTE — Discharge Summary (Signed)
Physician Discharge Summary  Chase Blackwell ZDG:[id number] DOB: 1965/08/23 DOA: 09/16/2014  PCP: Chase Gravel, MD  Admit date: 09/16/2014 Discharge date: 09/20/2014  Time spent: 35 minutes  Recommendations for Outpatient Follow-up:  1. To SNF 2. Outpatient follow up with Dr. Osker Blackwell: please FU Final path of pancreatic mass FNA, prelim positive for neoplastic cells 3. Watch sedating meds 4. CMP, CBC 1 week 5. Lactulose for 3 BMS/day  Discharge Diagnoses:  Principal Problem:   Acute encephalopathy Active Problems:   PARANOID SCHIZOPHRENIA, CHRONIC   Alcohol abuse   Essential hypertension   Seizures   CAD (coronary artery disease)   Anemia   Pancreatic mass   Malnutrition of moderate degree   Discharge Condition: stable  Diet recommendation: regular  Filed Weights   09/16/14 0410  Weight: 63.504 kg (140 lb)   History of present illness:  Chase Blackwell is a 49 y.o. male with past medical history of hypertension, asthma, GERD, schizophrenia, alcohol abuse, seizures, recurrent pancreatitis, pancreatic mass, depression, CAD who was sent by [facility] in [place], a nursing home facility for evaluation due to change in mental status. Patient was recently admitted with a pancreatic mass. Other than the medical records and documents sent from the nursing home, there is no further history available.   Hospital Course:  Acute encephalopathy: improving -d/c depakote, keppra dose already decreased -oxycodone PRN.  - EEG ok -MRI brain with and without contrast: 1. Progressive atrophy since the prior MRI without significant white matter disease. This can be seen in the setting of prolonged hospitalization ends and parenteral nutrition. 2. No acute or focal intracranial abnormality to explain seizures. - Has h/o previous MI with hypoxic brain injury, but per Dr. Broadus Blackwell at discharge, ambulating and conversant at baseline- back to this baseline just slower -Afebrile off abx and cx  remain negative- pro calcitonin trending down -Recent B12, folate ok. CT brain ok. -suspect will need palliative care consult- not sure he can tolerate treatment for his malignancy   PARANOID SCHIZOPHRENIA, CHRONIC  Alcohol abuse  Essential hypertension  Seizures  CAD (coronary artery disease)  Anemia  Pancreatic malignancy: will need reschedule of onc f/u. Marland Kitchen Watch for sedation -elevated LFTS-- decrease back to normal     Consultations:    Discharge Exam: Filed Vitals:   09/20/14 0800  BP: 138/84  Pulse: 100  Temp:   Resp: 20    General: slow to respond-- appears more alert in afternoon./evening   Discharge Instructions   Discharge Instructions    Diet - low sodium heart healthy    Complete by:  As directed      Discharge instructions    Complete by:  As directed   Avoid multiple sedating meds at same time     Increase activity slowly    Complete by:  As directed           Current Discharge Medication List    START taking these medications   Details  !! feeding supplement (BOOST / RESOURCE BREEZE) LIQD Take 1 Container by mouth daily. Refills: 0    lactulose (CHRONULAC) 10 GM/15ML solution Take 15 mLs (10 g total) by mouth 2 (two) times daily. Qty: 240 mL, Refills: 0     !! - Potential duplicate medications found. Please discuss with provider.    CONTINUE these medications which have CHANGED   Details  levETIRAcetam (KEPPRA) 250 MG tablet Take 1 tablet (250 mg total) by mouth 2 (two) times daily.    LORazepam (ATIVAN)  0.5 MG tablet TAKE 1 TABLET BY MOUTH EVERY EVENING AS NEEDED Qty: 20 tablet, Refills: 0    oxyCODONE (OXY IR/ROXICODONE) 5 MG immediate release tablet Take 1 tablet (5 mg total) by mouth every 6 (six) hours as needed for moderate pain or severe pain. Qty: 15 tablet, Refills: 0      CONTINUE these medications which have NOT CHANGED   Details  clopidogrel (PLAVIX) 75 MG tablet Take 75 mg by mouth daily.    !! feeding  supplement, ENSURE ENLIVE, (ENSURE ENLIVE) LIQD Take 237 mLs by mouth 2 (two) times daily between meals. Qty: 237 mL, Refills: 12    haloperidol decanoate (HALDOL DECANOATE) 100 MG/ML injection Inject 1 mL (100 mg total) into the muscle every 28 (twenty-eight) days. Qty: 1 mL, Refills: 0    metoprolol tartrate (LOPRESSOR) 25 MG tablet Take 0.5 tablets (12.5 mg total) by mouth 2 (two) times daily. Qty: 30 tablet, Refills: 0    nicotine (NICODERM CQ - DOSED IN MG/24 HOURS) 21 mg/24hr patch Place 1 patch (21 mg total) onto the skin daily. Qty: 28 patch, Refills: 0    Pancrelipase, Lip-Prot-Amyl, 24000 UNITS CPEP Two PO with meals (TID) and one PO with snacks (BID). Total of 8 daily. Qty: 240 capsule, Refills: 3    polyethylene glycol (MIRALAX / GLYCOLAX) packet Take 17 g by mouth daily.    thiamine 100 MG tablet Take 1 tablet (100 mg total) by mouth daily.    vitamin B-12 500 MCG tablet Take 1 tablet (500 mcg total) by mouth daily. Qty: 30 tablet, Refills: 1    folic acid (FOLVITE) 1 MG tablet Take 1 tablet (1 mg total) by mouth daily.    QUEtiapine (SEROQUEL) 50 MG tablet Take 1 tablet (50 mg total) by mouth at bedtime. Qty: 30 tablet, Refills: 0     !! - Potential duplicate medications found. Please discuss with provider.    STOP taking these medications     benztropine (COGENTIN) 0.5 MG tablet      divalproex (DEPAKOTE) 500 MG DR tablet      furosemide (LASIX) 40 MG tablet      hydrALAZINE (APRESOLINE) 10 MG tablet      traZODone (DESYREL) 100 MG tablet      aspirin 81 MG chewable tablet      atorvastatin (LIPITOR) 80 MG tablet        Allergies  Allergen Reactions  . Hctz [Hydrochlorothiazide] Other (See Comments)    Dizzy spells  . Hydroxyzine Hives  . Sulfonamide Derivatives Hives  . Cetirizine & Related Other (See Comments)    Brothers were not aware of this allergy   Follow-up Information    Follow up with Chase Gravel, MD In 2 weeks.   Specialty:  Internal  Medicine   Contact information:   [address] [place] [address] [phone]        The results of significant diagnostics from this hospitalization (including imaging, microbiology, ancillary and laboratory) are listed below for reference.    Significant Diagnostic Studies: Ct Abdomen Pelvis W Wo Contrast  09/10/2014   CLINICAL DATA:  Progressive biliary dilatation, with hypodense pancreatic lesion concerning for possible malignancy on prior CT.  EXAM: CT ABDOMEN AND PELVIS WITHOUT AND WITH CONTRAST  TECHNIQUE: Multidetector CT imaging of the abdomen and pelvis was performed following the standard protocol before and following the bolus administration of intravenous contrast.  CONTRAST:  144mL OMNIPAQUE IOHEXOL 350 MG/ML SOLN  COMPARISON:  08/31/2014; 07/24/2014  FINDINGS: Lower chest: Posterior basal segment subsegmental atelectasis bilaterally.  Hepatobiliary: Considerable intrahepatic and extrahepatic biliary dilatation, new compared to 01/12/14, extrahepatic bile duct measuring up to 2 cm diameter. Fairly abrupt tapering of the common bile duct in the vicinity of the pancreatic head where there is some faint enhancement along the distal visualize margin of the CBD. This tapering seems to be about a cm proximal to the expected location of the ampulla.  Mild wall thickening of the gallbladder, with mildly complex fluid within the gallbladder.  Pancreas: Dilated and somewhat beaded dorsal pancreatic duct. This terminates in the vicinity of the pancreatic head where there appears to be a 2.6 by 2.4 cm hypoenhancing mass on the arterial phase images. When I compared back to 01/12/2014, this mass appears to be new.  Spleen: Unremarkable  Adrenals/Urinary Tract: Peripheral scarring of the right kidney lower pole.  Stomach/Bowel: Prominent stool throughout the colon favors constipation. Scattered mildly dilated loops of upper abdominal small bowel. Appendix normal.  Vascular/Lymphatic:  Upper peripancreatic lymph node 1.2 cm in short axis, image 48 series 2. 1.1 cm right porta hepatis lymph node, image 45 series 2. Additional smaller porta hepatis lymph nodes are present.  Variant anatomy of the hepatic arteries, with the lateral segment left hepatic artery arising from a trifurcation of the celiac trunk, and the right hepatic artery and medial segment left hepatic artery arising from the proper hepatic artery.  With regard to vascular involvement by the tumor, there appears to be an intact fat plane between the SMA and the pancreatic head hypodense mass. The gastroduodenal artery is near the margin of the mass with some indistinct fat planes but is not definitely surrounded by tumor. The tumor does appear to flatten the upper portion of the SMV on images 51-55 of series 2, without definite invasion of the vein.  Small retroperitoneal lymph nodes are not pathologically enlarged by size criteria.  Aortoiliac atherosclerotic vascular disease.  Reproductive: The mildly heterogeneous central zone of the prostate gland likely from mild benign prostatic hypertrophy.  Other: No supplemental non-categorized findings.  Musculoskeletal: Unremarkable  IMPRESSION: 1. 2.6 by 2.4 cm hypoenhancing mass in the pancreatic head associated with obstruction of the distal common bile duct and of the dorsal pancreatic duct. Appearance highly concerning for pancreatic adenocarcinoma. Tissue diagnosis recommended. 2. The mass minimally abuts the proximal SMV, without obvious invasion. Part of the mass is adjacent to the gastroduodenal artery. 3. Variant vascular anatomy of the liver in which the arterial supply to the lateral segment left hepatic lobe comes directly off the celiac trunk at a distal trifurcation. 4. Small but possibly malignant peripancreatic lymph nodes. 5. Gallbladder wall thickening, with suspected sludge in the gallbladder causing a higher than fluid density appearance. 6. No focal hepatic metastatic  lesion is are identified.   Electronically Signed   By: Van Clines M.D.   On: 09/10/2014 12:01   Dg Chest 2 View  09/16/2014   CLINICAL DATA:  Altered mental status.  Tachycardia.  Onset today.  EXAM: CHEST  2 VIEW  COMPARISON:  08/02/2014  FINDINGS: The cardiomediastinal contours are normal. Lung volumes are low. Pulmonary vasculature is normal. No consolidation, pleural effusion, or pneumothorax. No acute osseous abnormalities are seen.  IMPRESSION: No acute pulmonary process.   Electronically Signed   By: Jeb Levering M.D.   On: 09/16/2014 06:23   Ct Head Wo Contrast  09/16/2014   CLINICAL DATA:  Altered mental status, disoriented and lethargic.  EXAM: CT HEAD  WITHOUT CONTRAST  TECHNIQUE: Contiguous axial images were obtained from the base of the skull through the vertex without intravenous contrast.  COMPARISON:  06/18/2014  FINDINGS: Generalized cerebral and cerebellar volume loss, unchanged.No intracranial hemorrhage, mass effect, or midline shift. No hydrocephalus. The basilar cisterns are patent. No evidence of territorial infarct. No intracranial fluid collection. Calvarium is intact. Included paranasal sinuses and mastoid air cells are well aerated.  IMPRESSION: Generalized volume loss without acute intracranial abnormality.   Electronically Signed   By: Jeb Levering M.D.   On: 09/16/2014 05:18   Mr Jeri Cos SL Contrast  09/19/2014   CLINICAL DATA:  Acute encephalopathy. Acute pancreatitis. Paranoid schizophrenia. Seizure. 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 Altered mental status.  EXAM: MRI HEAD WITHOUT AND WITH CONTRAST  TECHNIQUE: Multiplanar, multiecho pulse sequences of the brain and surrounding structures were obtained without and with intravenous contrast.  CONTRAST:  71mL MULTIHANCE GADOBENATE DIMEGLUMINE 529 MG/ML IV SOLN  COMPARISON:  CT head without contrast 09/16/2014 and MRI brain 07/11/2013.  FINDINGS: Mild generalized atrophy is advanced for age. There  is no significant white matter disease. No acute infarct, hemorrhage, or mass lesion is present. The ventricles are proportionate to the degree of atrophy.  Basal ganglia and brainstem are intact. The internal auditory canals are within normal limits.  Flow is present in the major intracranial arteries. The globes and orbits are intact. The paranasal sinuses and mastoid air cells are clear.  The skullbase is within normal limits. Midline structures are unremarkable.  The postcontrast images demonstrate no pathologic enhancement.  Dedicated imaging of the temporal lobes demonstrate symmetric size and signal of the hippocampal structures.  IMPRESSION: 1. Progressive atrophy since the prior MRI without significant white matter disease. This can be seen in the setting of prolonged hospitalization ends and parenteral nutrition. 2. No acute or focal intracranial abnormality to explain seizures.   Electronically Signed   By: San Morelle M.D.   On: 09/19/2014 11:51   Ct Abdomen Pelvis W Contrast  08/31/2014   CLINICAL DATA:  Upper abdominal pain. Increase bilirubin. Recent hospitalization for pancreatitis.  EXAM: CT ABDOMEN AND PELVIS WITH CONTRAST  TECHNIQUE: Multidetector CT imaging of the abdomen and pelvis was performed using the standard protocol following bolus administration of intravenous contrast.  CONTRAST:  123mL OMNIPAQUE IOHEXOL 300 MG/ML  SOLN  COMPARISON:  08/03/2014.  MRI 07/24/2014  FINDINGS: Lung bases are clear. No effusions. Heart is normal size. Coronary artery calcifications noted in the visualized right coronary artery.  There is worsening biliary ductal dilatation, both intrahepatic and extrahepatic. Gallbladder is distended, similar to prior study. Pancreatic duct is dilated. Area of low density within the pancreatic head. This could reflect edema related pancreatitis, but it is difficult to completely exclude a pancreatic head lesion given the degree of biliary and pancreatic ductal  dilatation.  Large stool burden throughout the colon. Views gaseous distention of bowel may reflect mild ileus. Aorta and iliac vessels are calcified, non aneurysmal.  Spleen, adrenals and kidneys are unremarkable. No free fluid, free air or adenopathy.  No acute bony abnormality or focal bone lesion.  IMPRESSION: Increasing biliary ductal dilatation within the liver and in the common bile duct. Continued pancreatic ductal dilatation. There is an area of ill-defined low-density in the pancreatic head which could reflect edema related to acute pancreatitis, but I cannot exclude pancreatic head lesion. After acute symptoms resolve, MRI of the abdomen would be beneficial to further evaluate. Followup MRI was also recommended on  prior MRI of the abdomen.  Large stool burden throughout the colon. Mild diffuse gaseous distention of bowel may reflect ileus.  Right coronary artery calcifications.   Electronically Signed   By: Rolm Baptise M.D.   On: 08/31/2014 21:56   Dg Ercp Biliary & Pancreatic Ducts  09/13/2014   CLINICAL DATA:  Obstructive jaundice  EXAM: ERCP  TECHNIQUE: Multiple spot images obtained with the fluoroscopic device and submitted for interpretation post-procedure.  FLUOROSCOPY TIME:  2 minutes 12 seconds  COMPARISON:  CT 09/10/2014  FINDINGS: Six fluoroscopic spot images from ERCP document endoscopic catheterization of the CBD and opacification. The intrahepatic bile ducts are incompletely opacified, dilated centrally. The proximal CBD is dilated. There is a high-grade stenosis in the distal CBD. Final image documents placement of a metallic stent across the distal CBD. The midportion of the stent remains partially constrained.  IMPRESSION: 1. Distal CBD obstruction, with endoscopic metallic stent placement. These images were submitted for radiologic interpretation only. Please see the procedural report for the amount of contrast and the fluoroscopy time utilized.   Electronically Signed   By: Lucrezia Europe  M.D.   On: 09/13/2014 13:04    Microbiology: Recent Results (from the past 240 hour(s))  Surgical pcr screen     Status: None   Collection Time: 09/13/14  7:40 AM  Result Value Ref Range Status   MRSA, PCR NEGATIVE NEGATIVE Final   Staphylococcus aureus NEGATIVE NEGATIVE Final    Comment:        The Xpert SA Assay (FDA approved for NASAL specimens in patients over 43 years of age), is one component of a comprehensive surveillance program.  Test performance has been validated by [facility] for patients greater than or equal to 82 year old. It is not intended to diagnose infection nor to guide or monitor treatment.   Blood culture (routine x 2)     Status: None (Preliminary result)   Collection Time: 09/16/14  3:41 AM  Result Value Ref Range Status   Specimen Description BLOOD RIGHT ANTECUBITAL  Final   Special Requests BOTTLES DRAWN AEROBIC AND ANAEROBIC 10MLS  Final   Culture NO GROWTH 3 DAYS  Final   Report Status PENDING  Incomplete  Blood culture (routine x 2)     Status: None (Preliminary result)   Collection Time: 09/16/14  4:05 AM  Result Value Ref Range Status   Specimen Description BLOOD LEFT ANTECUBITAL  Final   Special Requests BOTTLES DRAWN AEROBIC AND ANAEROBIC 2.5CC  Final   Culture NO GROWTH 3 DAYS  Final   Report Status PENDING  Incomplete     Labs: Basic Metabolic Panel:  Recent Labs Lab 09/14/14 0533 09/16/14 0318 09/16/14 1132 09/17/14 0708 09/19/14 0758  NA 137 132*  --  137 135  K 3.7 3.7  --  3.6 3.6  CL 100* 97*  --  107 104  CO2 27 24  --  22 24  GLUCOSE 107* 111*  --  86 148*  BUN <5* 13  --  10 <5*  CREATININE 0.60* 0.67  --  0.69 0.42*  CALCIUM 9.4 9.3  --  8.1* 8.3*  MG  --   --  1.8  --   --    Liver Function Tests:  Recent Labs Lab 09/14/14 0533 09/16/14 0318 09/17/14 0708 09/19/14 0758  AST 48* 117* 45* 28  ALT 63 125* 73* 48  ALKPHOS 746* 782* 508* 339*  BILITOT 5.5* 6.6* 3.8* 2.8*  PROT 6.7  6.6 5.1* 5.7*  ALBUMIN  2.9* 2.8* 2.0* 2.1*    Recent Labs Lab 09/16/14 0318  LIPASE 36    Recent Labs Lab 09/16/14 0318 09/19/14 0758  AMMONIA 38* 30   CBC:  Recent Labs Lab 09/14/14 0533 09/16/14 0318 09/17/14 0708  WBC 7.6 5.9 4.0  NEUTROABS  --  5.1  --   HGB 10.3* 10.4* 9.8*  HCT 29.5* 30.3* 28.6*  MCV 91.6 89.6 90.2  PLT 278 247 190   Cardiac Enzymes: No results for input(s): CKTOTAL, CKMB, CKMBINDEX, TROPONINI in the last 168 hours. BNP: BNP (last 3 results)  Recent Labs  05/01/14 2153 06/06/14 2206  BNP 21.0 28.5    ProBNP (last 3 results) No results for input(s): PROBNP in the last 8760 hours.  CBG:  Recent Labs Lab 09/14/14 0828 09/16/14 0320  GLUCAP 102* 101*       Signed:  Leonette Tischer  [facility] 09/20/2014, 10:35 AM

## 2014-09-20 NOTE — Clinical Social Work Note (Signed)
CSW spoke the pt's brother Elenore Rota. CSW informed Elenore Rota that the pt will be discharge back to [facility] [place] today. CSW contact [facility] at [phone] to schedule transport for the pt. CSW informed Debbie at [facility] regarding the pt return. CSW upload the pt's discharge summary. Bedside RN provided the number to call report.   [place], MSW, [place]

## 2014-09-21 LAB — CULTURE, BLOOD (ROUTINE X 2)
CULTURE: NO GROWTH
Culture: NO GROWTH

## 2014-09-22 ENCOUNTER — Ambulatory Visit (HOSPITAL_COMMUNITY)
Admission: RE | Admit: 2014-09-22 | Discharge: 2014-09-22 | Disposition: A | Discharge status: Home / Self Care | Payer: Medicare Other | Source: Ambulatory Visit | Attending: Internal Medicine | Admitting: Internal Medicine

## 2014-09-22 ENCOUNTER — Other Ambulatory Visit (HOSPITAL_COMMUNITY): Payer: Self-pay | Admitting: Internal Medicine

## 2014-09-22 DIAGNOSIS — R531 Weakness: Secondary | ICD-10-CM | POA: Insufficient documentation

## 2014-09-22 DIAGNOSIS — R109 Unspecified abdominal pain: Secondary | ICD-10-CM

## 2014-09-22 DIAGNOSIS — R102 Pelvic and perineal pain: Secondary | ICD-10-CM | POA: Insufficient documentation

## 2014-09-22 DIAGNOSIS — R634 Abnormal weight loss: Secondary | ICD-10-CM | POA: Insufficient documentation

## 2014-09-22 DIAGNOSIS — K868 Other specified diseases of pancreas: Secondary | ICD-10-CM | POA: Insufficient documentation

## 2014-09-24 ENCOUNTER — Telehealth: Payer: Self-pay | Admitting: *Deleted

## 2014-09-24 NOTE — Telephone Encounter (Signed)
Spoke with NP at SNF: Chase Blackwell is not mentally competent to make decisions. She is planning on contacting his brother, Chase Blackwell to discuss palliative care with oral Roxanol. She feels they could keep him comfortable. Chase Blackwell is pacing up and down the halls in pain-supposed to be NPO, but he is eating. Keeps pulling his IV out--trying to administer IV antibiotics. She would appreciate call back with update after case is presented in GI Tumor Board tomorrow. Afterwards, can decide if he needs to see oncologist in [place] or [place]. Family member has been set on a [facility] physician.

## 2014-09-25 ENCOUNTER — Telehealth: Payer: Self-pay | Admitting: *Deleted

## 2014-09-25 NOTE — Telephone Encounter (Signed)
Left VM for NP to return call regarding tumor board discussion today.

## 2014-10-09 NOTE — Progress Notes (Signed)
Quick Note:  Under care of [facility]. ______

## 2014-11-25 ENCOUNTER — Encounter (HOSPITAL_COMMUNITY): Payer: Self-pay | Admitting: Emergency Medicine

## 2014-11-25 ENCOUNTER — Emergency Department (HOSPITAL_COMMUNITY)
Admission: EM | Admit: 2014-11-25 | Discharge: 2014-11-25 | Disposition: A | Discharge status: Skilled Nursing Facility | Payer: Medicare Other | Attending: Emergency Medicine | Admitting: Emergency Medicine

## 2014-11-25 DIAGNOSIS — F329 Major depressive disorder, single episode, unspecified: Secondary | ICD-10-CM | POA: Diagnosis not present

## 2014-11-25 DIAGNOSIS — F17210 Nicotine dependence, cigarettes, uncomplicated: Secondary | ICD-10-CM | POA: Insufficient documentation

## 2014-11-25 DIAGNOSIS — Z7902 Long term (current) use of antithrombotics/antiplatelets: Secondary | ICD-10-CM | POA: Insufficient documentation

## 2014-11-25 DIAGNOSIS — F1110 Opioid abuse, uncomplicated: Secondary | ICD-10-CM | POA: Insufficient documentation

## 2014-11-25 DIAGNOSIS — I2510 Atherosclerotic heart disease of native coronary artery without angina pectoris: Secondary | ICD-10-CM | POA: Insufficient documentation

## 2014-11-25 DIAGNOSIS — R569 Unspecified convulsions: Secondary | ICD-10-CM | POA: Insufficient documentation

## 2014-11-25 DIAGNOSIS — Z8639 Personal history of other endocrine, nutritional and metabolic disease: Secondary | ICD-10-CM | POA: Diagnosis not present

## 2014-11-25 DIAGNOSIS — Z79899 Other long term (current) drug therapy: Secondary | ICD-10-CM | POA: Insufficient documentation

## 2014-11-25 DIAGNOSIS — J45909 Unspecified asthma, uncomplicated: Secondary | ICD-10-CM | POA: Diagnosis not present

## 2014-11-25 DIAGNOSIS — G8929 Other chronic pain: Secondary | ICD-10-CM | POA: Diagnosis not present

## 2014-11-25 DIAGNOSIS — R410 Disorientation, unspecified: Secondary | ICD-10-CM | POA: Insufficient documentation

## 2014-11-25 DIAGNOSIS — F209 Schizophrenia, unspecified: Secondary | ICD-10-CM | POA: Insufficient documentation

## 2014-11-25 DIAGNOSIS — I10 Essential (primary) hypertension: Secondary | ICD-10-CM | POA: Diagnosis not present

## 2014-11-25 DIAGNOSIS — Z9889 Other specified postprocedural states: Secondary | ICD-10-CM | POA: Diagnosis not present

## 2014-11-25 DIAGNOSIS — Z8719 Personal history of other diseases of the digestive system: Secondary | ICD-10-CM | POA: Diagnosis not present

## 2014-11-25 LAB — URINALYSIS, ROUTINE W REFLEX MICROSCOPIC
BILIRUBIN URINE: NEGATIVE
GLUCOSE, UA: NEGATIVE mg/dL
HGB URINE DIPSTICK: NEGATIVE
KETONES UR: NEGATIVE mg/dL
Leukocytes, UA: NEGATIVE
Nitrite: NEGATIVE
PH: 7.5 (ref 5.0–8.0)
Protein, ur: NEGATIVE mg/dL
Specific Gravity, Urine: 1.01 (ref 1.005–1.030)

## 2014-11-25 LAB — COMPREHENSIVE METABOLIC PANEL
ALBUMIN: 3.4 g/dL — AB (ref 3.5–5.0)
ALT: 22 U/L (ref 17–63)
ANION GAP: 10 (ref 5–15)
AST: 18 U/L (ref 15–41)
Alkaline Phosphatase: 94 U/L (ref 38–126)
BUN: <5 mg/dL — ABNORMAL LOW (ref 6–20)
CHLORIDE: 101 mmol/L (ref 101–111)
CO2: 27 mmol/L (ref 22–32)
Calcium: 9.2 mg/dL (ref 8.9–10.3)
Creatinine, Ser: 0.47 mg/dL — ABNORMAL LOW (ref 0.61–1.24)
GFR calc non Af Amer: >60 mL/min (ref >60)
GLUCOSE: 137 mg/dL — AB (ref 65–99)
Potassium: 3.8 mmol/L (ref 3.5–5.1)
SODIUM: 138 mmol/L (ref 135–145)
Total Bilirubin: 0.4 mg/dL (ref 0.3–1.2)
Total Protein: 7.2 g/dL (ref 6.5–8.1)

## 2014-11-25 LAB — CBC
HCT: 41.6 % (ref 39.0–52.0)
Hemoglobin: 13.9 g/dL (ref 13.0–17.0)
MCH: 28.7 pg (ref 26.0–34.0)
MCHC: 33.4 g/dL (ref 30.0–36.0)
MCV: 86 fL (ref 78.0–100.0)
PLATELETS: 268 10*3/uL (ref 150–400)
RBC: 4.84 MIL/uL (ref 4.22–5.81)
RDW: 13.5 % (ref 11.5–15.5)
WBC: 8.7 10*3/uL (ref 4.0–10.5)

## 2014-11-25 LAB — RAPID URINE DRUG SCREEN, HOSP PERFORMED
AMPHETAMINES: NOT DETECTED
BENZODIAZEPINES: NOT DETECTED
Barbiturates: NOT DETECTED
COCAINE: NOT DETECTED
OPIATES: POSITIVE — AB
TETRAHYDROCANNABINOL: NOT DETECTED

## 2014-11-25 LAB — ETHANOL: Alcohol, Ethyl (B): <5 mg/dL (ref <5)

## 2014-11-25 MED ORDER — SODIUM CHLORIDE 0.9 % IV SOLN: INTRAVENOUS | Status: DC

## 2014-11-25 NOTE — ED Provider Notes (Addendum)
CSN: [id number]     Arrival date & time 11/25/14  1106 History   By signing my name below, I, Erling Conte, attest that this documentation has been prepared under the direction and in the presence of Lajean Saver, MD. Electronically Signed: Erling Conte, ED Scribe. 11/25/2014. 12:23 PM.    Chief Complaint  Patient presents with  . Seizures    The history is provided by the patient and the EMS personnel. The history is limited by the condition of the patient. No language interpreter was used.    HPI Comments: Chase Blackwell is a 49 y.o. male who presents to the Emergency Department due to a seizure that occurred this morning. Per nursing note he has a h/o seizures and is staying at [facility] facility where he had a seizure this morning. Pt denies any pain, vomiting or diarrhea.  Pt denies headache. No neck or back pain. Denies numbness/weakness. No abd pain. No nvd. No gu c/o. States compliant w normal meds. Pt unaware of any change in meds. Pt unsure of when last seizure. Pt denies trauma, fall or injury.  No fever or chills.         Past Medical History  Diagnosis Date  . Hypertension   . Asthma   . GERD (gastroesophageal reflux disease)   . Chronic pain   . Schizophrenia ([place])   . Alcohol abuse   . Seizures ([place]) 07/2013    seizure like activity following cardiac/resp arrest.   . 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 Depression   . CAD (coronary artery disease)   . Cardiac arrest ([place]) 07/2013    with acute MI and secondary respiratory arrest  . Protein calorie malnutrition ([place]) 07/2013   Past Surgical History  Procedure Laterality Date  . Left heart catheterization with coronary angiogram N/A 07/06/2013    Procedure: LEFT HEART CATHETERIZATION WITH CORONARY ANGIOGRAM;  Surgeon: Clent Demark, MD;  Location: [facility] CATH LAB;  Service: Cardiovascular;  Laterality: N/A;  . Percutaneous coronary stent intervention (pci-s)  07/06/2013    Procedure:  PERCUTANEOUS CORONARY STENT INTERVENTION (PCI-S);  Surgeon: Clent Demark, MD;  Location: [facility] CATH LAB;  Service: Cardiovascular;;  . Tracheostomy  07/2013  . Ercp N/A 09/13/2014    Procedure: ENDOSCOPIC RETROGRADE CHOLANGIOPANCREATOGRAPHY (ERCP);  Surgeon: Milus Banister, MD;  Location: [employer] [facility];  Service: Endoscopy;  Laterality: N/A;  . Upper esophageal endoscopic ultrasound (eus) N/A 09/13/2014    Procedure: UPPER ESOPHAGEAL ENDOSCOPIC ULTRASOUND (EUS);  Surgeon: Milus Banister, MD;  Location: [place];  Service: Endoscopy;  Laterality: N/A;   Family History  Problem Relation Age of Onset  . Malignant hyperthermia Mother   . Cirrhosis Father   . Alcohol abuse Father    Social History  Substance Use Topics  . Smoking status: Current Every Day Smoker -- 2.00 packs/day    Types: Cigarettes  . Smokeless tobacco: None  . Alcohol Use: 0.0 oz/week    0 Standard drinks or equivalent per week     Comment: daily heavy drinker, 80 ounces every 1-2 days. for years    Review of Systems  Unable to perform ROS: Mental status change  Constitutional: Negative for fever and chills.  HENT: Negative for sore throat.   Eyes: Negative for pain and visual disturbance.  Respiratory: Negative for cough and shortness of breath.   Cardiovascular: Negative for chest pain, palpitations and leg swelling.  Gastrointestinal: Negative for vomiting, abdominal pain and diarrhea.  Endocrine:  Negative for polyuria.  Genitourinary: Negative for dysuria and flank pain.  Musculoskeletal: Negative for back pain and neck pain.  Skin: Negative for rash.  Neurological: Positive for seizures. Negative for headaches.  Hematological: Does not bruise/bleed easily.  Psychiatric/Behavioral:       Transiently post ictal/confused.       Allergies  Hctz; Hydroxyzine; Sulfonamide derivatives; and Cetirizine & related  Home Medications   Prior to Admission medications   Medication Sig Start Date End Date Taking?  Authorizing Provider  clopidogrel (PLAVIX) 75 MG tablet Take 75 mg by mouth daily. 06/11/14   Historical Provider, MD  feeding supplement (BOOST / RESOURCE BREEZE) LIQD Take 1 Container by mouth daily. 09/20/14   Geradine Girt, DO  feeding supplement, ENSURE ENLIVE, (ENSURE ENLIVE) LIQD Take 237 mLs by mouth 2 (two) times daily between meals. 06/11/14   Eugenie Filler, MD  folic acid (FOLVITE) 1 MG tablet Take 1 tablet (1 mg total) by mouth daily. Patient not taking: Reported on 09/16/2014 06/11/14   Eugenie Filler, MD  haloperidol decanoate (HALDOL DECANOATE) 100 MG/ML injection Inject 1 mL (100 mg total) into the muscle every 28 (twenty-eight) days. 08/09/14   Nita Sells, MD  lactulose (CHRONULAC) 10 GM/15ML solution Take 15 mLs (10 g total) by mouth 2 (two) times daily. 09/20/14   Geradine Girt, DO  levETIRAcetam (KEPPRA) 250 MG tablet Take 1 tablet (250 mg total) by mouth 2 (two) times daily. 09/20/14   Geradine Girt, DO  LORazepam (ATIVAN) 0.5 MG tablet TAKE 1 TABLET BY MOUTH EVERY EVENING AS NEEDED 09/20/14   Geradine Girt, DO  metoprolol tartrate (LOPRESSOR) 25 MG tablet Take 0.5 tablets (12.5 mg total) by mouth 2 (two) times daily. 02/13/14   Nicole Pisciotta, PA-C  nicotine (NICODERM CQ - DOSED IN MG/24 HOURS) 21 mg/24hr patch Place 1 patch (21 mg total) onto the skin daily. 09/13/14   Domenic Polite, MD  oxyCODONE (OXY IR/ROXICODONE) 5 MG immediate release tablet Take 1 tablet (5 mg total) by mouth every 6 (six) hours as needed for moderate pain or severe pain. 09/20/14   Geradine Girt, DO  Pancrelipase, Lip-Prot-Amyl, 24000 UNITS CPEP Two PO with meals (TID) and one PO with snacks (BID). Total of 8 daily. 09/06/14   Mahala Menghini, PA-C  polyethylene glycol (MIRALAX / GLYCOLAX) packet Take 17 g by mouth daily.    Historical Provider, MD  QUEtiapine (SEROQUEL) 50 MG tablet Take 1 tablet (50 mg total) by mouth at bedtime. Patient not taking: Reported on 09/16/2014 08/09/14   Nita Sells, MD  thiamine 100 MG tablet Take 1 tablet (100 mg total) by mouth daily. 06/11/14   Eugenie Filler, MD  vitamin B-12 500 MCG tablet Take 1 tablet (500 mcg total) by mouth daily. 07/02/14   Orson Eva, MD   Triage Vitals: BP 96/69 mmHg  Pulse 99  Temp(Src) 99.7 F (37.6 C) (Rectal)  Resp 25  Wt 135 lb (61.236 kg)  SpO2 100%  Physical Exam  Constitutional: He is oriented to person, place, and time. He appears well-developed and well-nourished. No distress.  HENT:  Head: Normocephalic and atraumatic.  Eyes: Conjunctivae and EOM are normal.  Neck: Neck supple. No tracheal deviation present.  Cardiovascular: Normal rate.   Pulmonary/Chest: Effort normal. No respiratory distress.  Musculoskeletal: Normal range of motion.  Neurological: He is alert and oriented to person, place, and time.  Skin: Skin is warm and dry.  Psychiatric: He has a  normal mood and affect. His behavior is normal.  Nursing note and vitals reviewed.   ED Course  Procedures (including critical care time)  DIAGNOSTIC STUDIES: Oxygen Saturation is 100% on RA, normal by my interpretation.    COORDINATION OF CARE: 12:24 PM- Will order 12 lead EKG and diagnostic lab work.  Pt advised of plan for treatment and pt agrees.   Results for orders placed or performed during the hospital encounter of 11/25/14  CBC  Result Value Ref Range   WBC 8.7 4.0 - 10.5 K/uL   RBC 4.84 4.22 - 5.81 MIL/uL   Hemoglobin 13.9 13.0 - 17.0 g/dL   HCT 41.6 39.0 - 52.0 %   MCV 86.0 78.0 - 100.0 fL   MCH 28.7 26.0 - 34.0 pg   MCHC 33.4 30.0 - 36.0 g/dL   RDW 13.5 11.5 - 15.5 %   Platelets 268 150 - 400 K/uL  Comprehensive metabolic panel  Result Value Ref Range   Sodium 138 135 - 145 mmol/L   Potassium 3.8 3.5 - 5.1 mmol/L   Chloride 101 101 - 111 mmol/L   CO2 27 22 - 32 mmol/L   Glucose, Bld 137 (H) 65 - 99 mg/dL   BUN <5 (L) 6 - 20 mg/dL   Creatinine, Ser 0.47 (L) 0.61 - 1.24 mg/dL   Calcium 9.2 8.9 - 10.3 mg/dL    Total Protein 7.2 6.5 - 8.1 g/dL   Albumin 3.4 (L) 3.5 - 5.0 g/dL   AST 18 15 - 41 U/L   ALT 22 17 - 63 U/L   Alkaline Phosphatase 94 38 - 126 U/L   Total Bilirubin 0.4 0.3 - 1.2 mg/dL   GFR calc non Af Amer >60 >60 mL/min   GFR calc Af Amer >60 >60 mL/min   Anion gap 10 5 - 15  Ethanol  Result Value Ref Range   Alcohol, Ethyl (B) <5 <5 mg/dL  Urine rapid drug screen (hosp performed)  Result Value Ref Range   Opiates POSITIVE (A) NONE DETECTED   Cocaine NONE DETECTED NONE DETECTED   Benzodiazepines NONE DETECTED NONE DETECTED   Amphetamines NONE DETECTED NONE DETECTED   Tetrahydrocannabinol NONE DETECTED NONE DETECTED   Barbiturates NONE DETECTED NONE DETECTED  Urinalysis, Routine w reflex microscopic (not at [facility])  Result Value Ref Range   Color, Urine YELLOW YELLOW   APPearance CLEAR CLEAR   Specific Gravity, Urine 1.010 1.005 - 1.030   pH 7.5 5.0 - 8.0   Glucose, UA NEGATIVE NEGATIVE mg/dL   Hgb urine dipstick NEGATIVE NEGATIVE   Bilirubin Urine NEGATIVE NEGATIVE   Ketones, ur NEGATIVE NEGATIVE mg/dL   Protein, ur NEGATIVE NEGATIVE mg/dL   Nitrite NEGATIVE NEGATIVE   Leukocytes, UA NEGATIVE NEGATIVE       I have personally reviewed and evaluated these images and lab results as part of my medical decision-making.   EKG Interpretation   Date/Time:  Monday November 25 2014 11:14:58 EST Ventricular Rate:  98 PR Interval:  156 QRS Duration: 93 QT Interval:  362 QTC Calculation: 462 R Axis:   78 Text Interpretation:  Sinus rhythm No significant change since last  tracing Confirmed by Ashok Cordia  MD, Lennette Bihari ([id number]) on 11/25/2014 11:18:52 AM      MDM   I personally performed the services described in this documentation, which was scribed in my presence. The recorded information has been reviewed and considered. Lajean Saver, MD   Reviewed nursing notes and prior charts for additional history.  Labs.   Seizure precautions.   Continuous pulse ox and  monitor.  Po fluids. Ambulate.  Recheck, pt denies any new c/o, denies pain. abd soft nt.   Pt requests d/c to e[facility].     Lajean Saver, MD 11/25/14 [phone]

## 2014-11-25 NOTE — ED Notes (Signed)
EMS called at this time for convalesent transport back to [facility] (S[facility]) [facility].

## 2014-11-25 NOTE — Discharge Instructions (Signed)
It was our pleasure to provide your ER care today - we hope that you feel better.  Take your seizure medication as prescribed.  Follow up with your doctor/neurologist in 1 week.  Return to ER if worse, new symptoms, recurrent seizures, fevers, persistent vomiting, trouble breathing, medical emergency, other concern.      Seizure, Adult A seizure is abnormal electrical activity in the brain. Seizures usually last from 30 seconds to 2 minutes. There are various types of seizures. Before a seizure, you may have a warning sensation (aura) that a seizure is about to occur. An aura may include the following symptoms:   Fear or anxiety.  Nausea.  Feeling like the room is spinning (vertigo).  Vision changes, such as seeing flashing lights or spots. Common symptoms during a seizure include:  A change in attention or behavior (altered mental status).  Convulsions with rhythmic jerking movements.  Drooling.  Rapid eye movements.  Grunting.  Loss of bladder and bowel control.  Bitter taste in the mouth.  Tongue biting. After a seizure, you may feel confused and sleepy. You may also have an injury resulting from convulsions during the seizure. HOME CARE INSTRUCTIONS   If you are given medicines, take them exactly as prescribed by your health care provider.  Keep all follow-up appointments as directed by your health care provider.  Do not swim or drive or engage in risky activity during which a seizure could cause further injury to you or others until your health care provider says it is OK.  Get adequate rest.  Teach friends and family what to do if you have a seizure. They should:  Lay you on the ground to prevent a fall.  Put a cushion under your head.  Loosen any tight clothing around your neck.  Turn you on your side. If vomiting occurs, this helps keep your airway clear.  Stay with you until you recover.  Know whether or not you need emergency care. SEEK  IMMEDIATE MEDICAL CARE IF:  The seizure lasts longer than 5 minutes.  The seizure is severe or you do not wake up immediately after the seizure.  You have an altered mental status after the seizure.  You are having more frequent or worsening seizures. Someone should drive you to the emergency department or call local emergency services (911 in [place] MAKE SURE YOU:  Understand these instructions.  Will watch your condition.  Will get help right away if you are not doing well or get worse.   This information is not intended to replace advice given to you by your health care provider. Make sure you discuss any questions you have with your health care provider.   Document Released: 12/19/1999 Document Revised: 01/11/2014 Document Reviewed: 08/02/2012 Elsevier Interactive Patient Education [employer].

## 2014-11-25 NOTE — ED Notes (Signed)
Patient from [place] witnessed seizure outside in smoking area. H/o same. H/o pancreatic CA.

## 2014-11-25 NOTE — ED Notes (Signed)
Patient moved from room 10 to room 16 for visualization from nurse's station due to post-ictal confusion/agitation. Patient has made multiple attempts to get out of bed.

## 2014-11-25 NOTE — ED Notes (Signed)
EMS transferred pt back to [facility] at this time.

## 2014-11-25 NOTE — ED Notes (Signed)
PT urinated 200cc with urinal in bed at this time.

## 2015-02-10 IMAGING — CR DG ABD PORTABLE 1V
1 series · 1 of 1 positions shown · non-contrast
Comparison: 07/20/2013 radiographs.

CLINICAL DATA: NG tube placement

EXAM:
PORTABLE ABDOMEN - 1 VIEW

[AP]
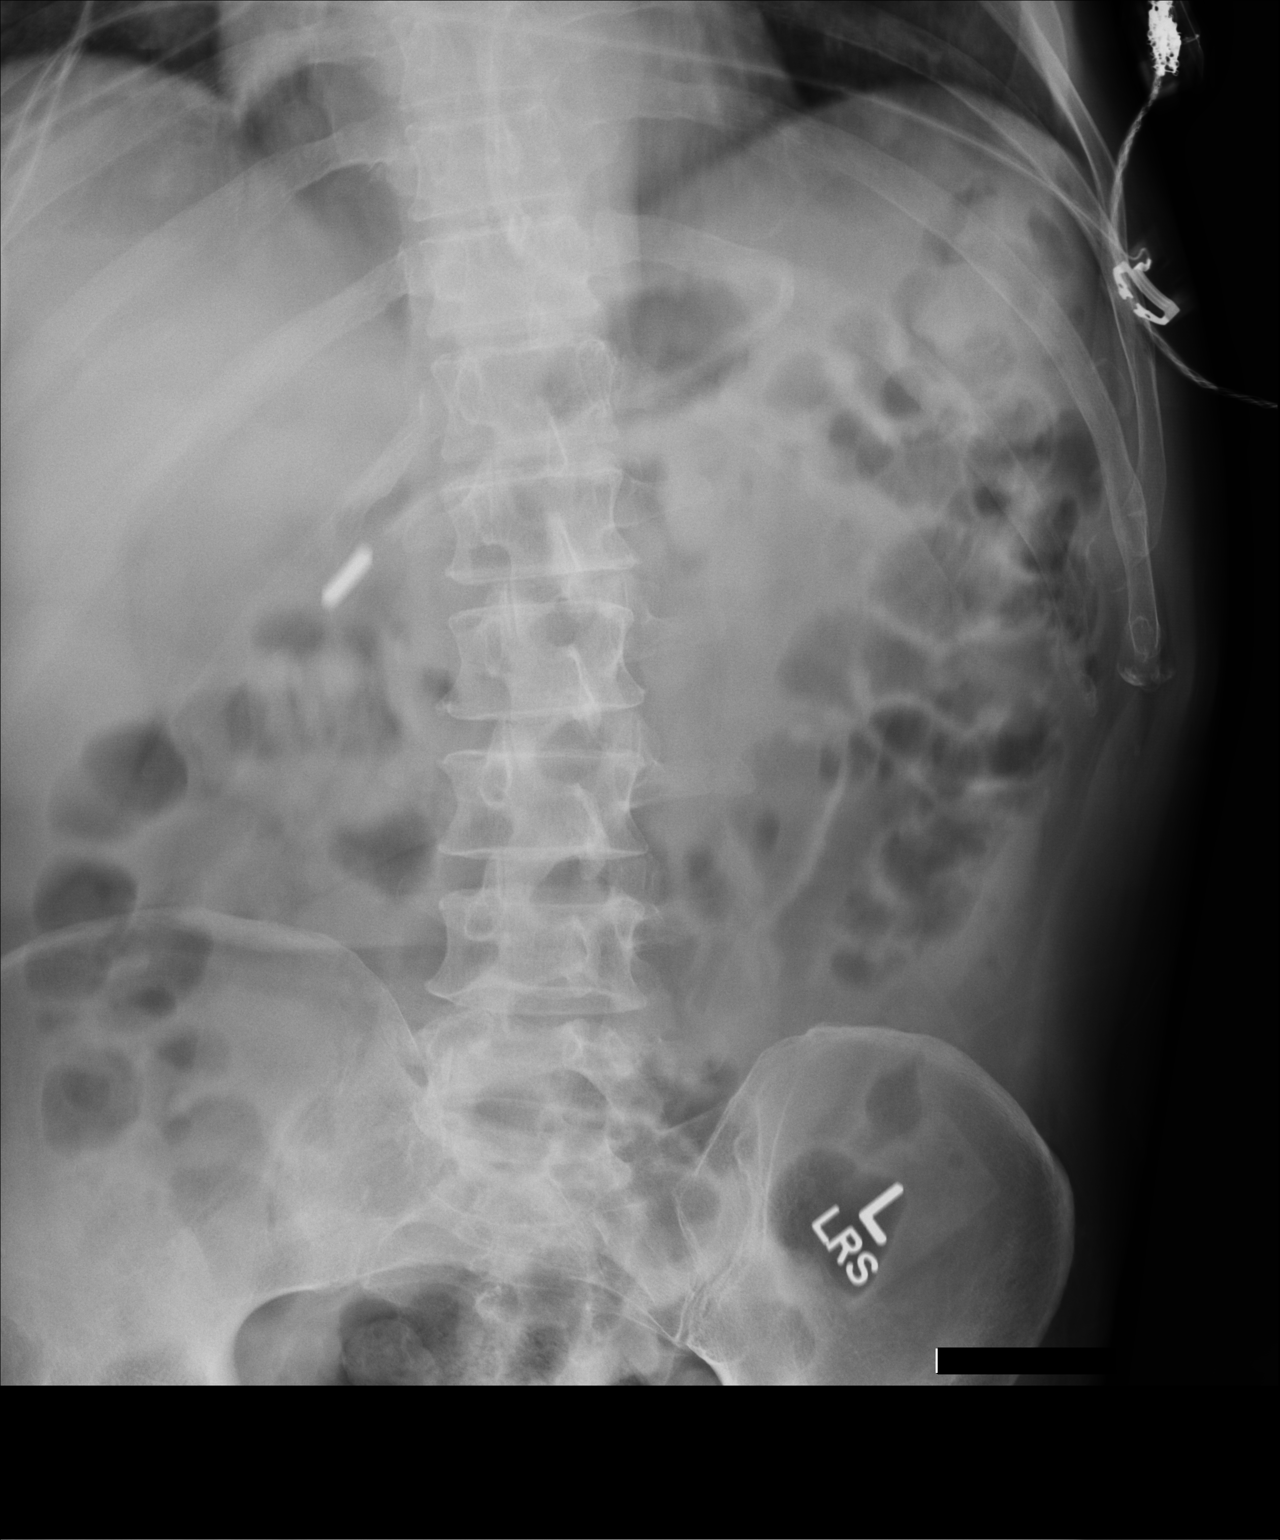

[1 of 1 positions shown; findings below may reference images not displayed]

FINDINGS: 3933 hr. Examination is motion degraded. No nasogastric tube is
visualized. A feeding tube projects into the distal stomach or
proximal duodenum. The visualized bowel gas pattern is
nonobstructive.
IMPRESSION: Feeding tube has been advanced into the distal stomach or proximal
duodenum. No visible nasogastric tube.

## 2015-02-10 IMAGING — CR DG CHEST 1V PORT
1 series · 1 of 1 positions shown · non-contrast
Comparison: Chest x-ray 07/21/2013.

CLINICAL DATA: Respiratory failure.

EXAM:
PORTABLE CHEST - 1 VIEW

[AP]
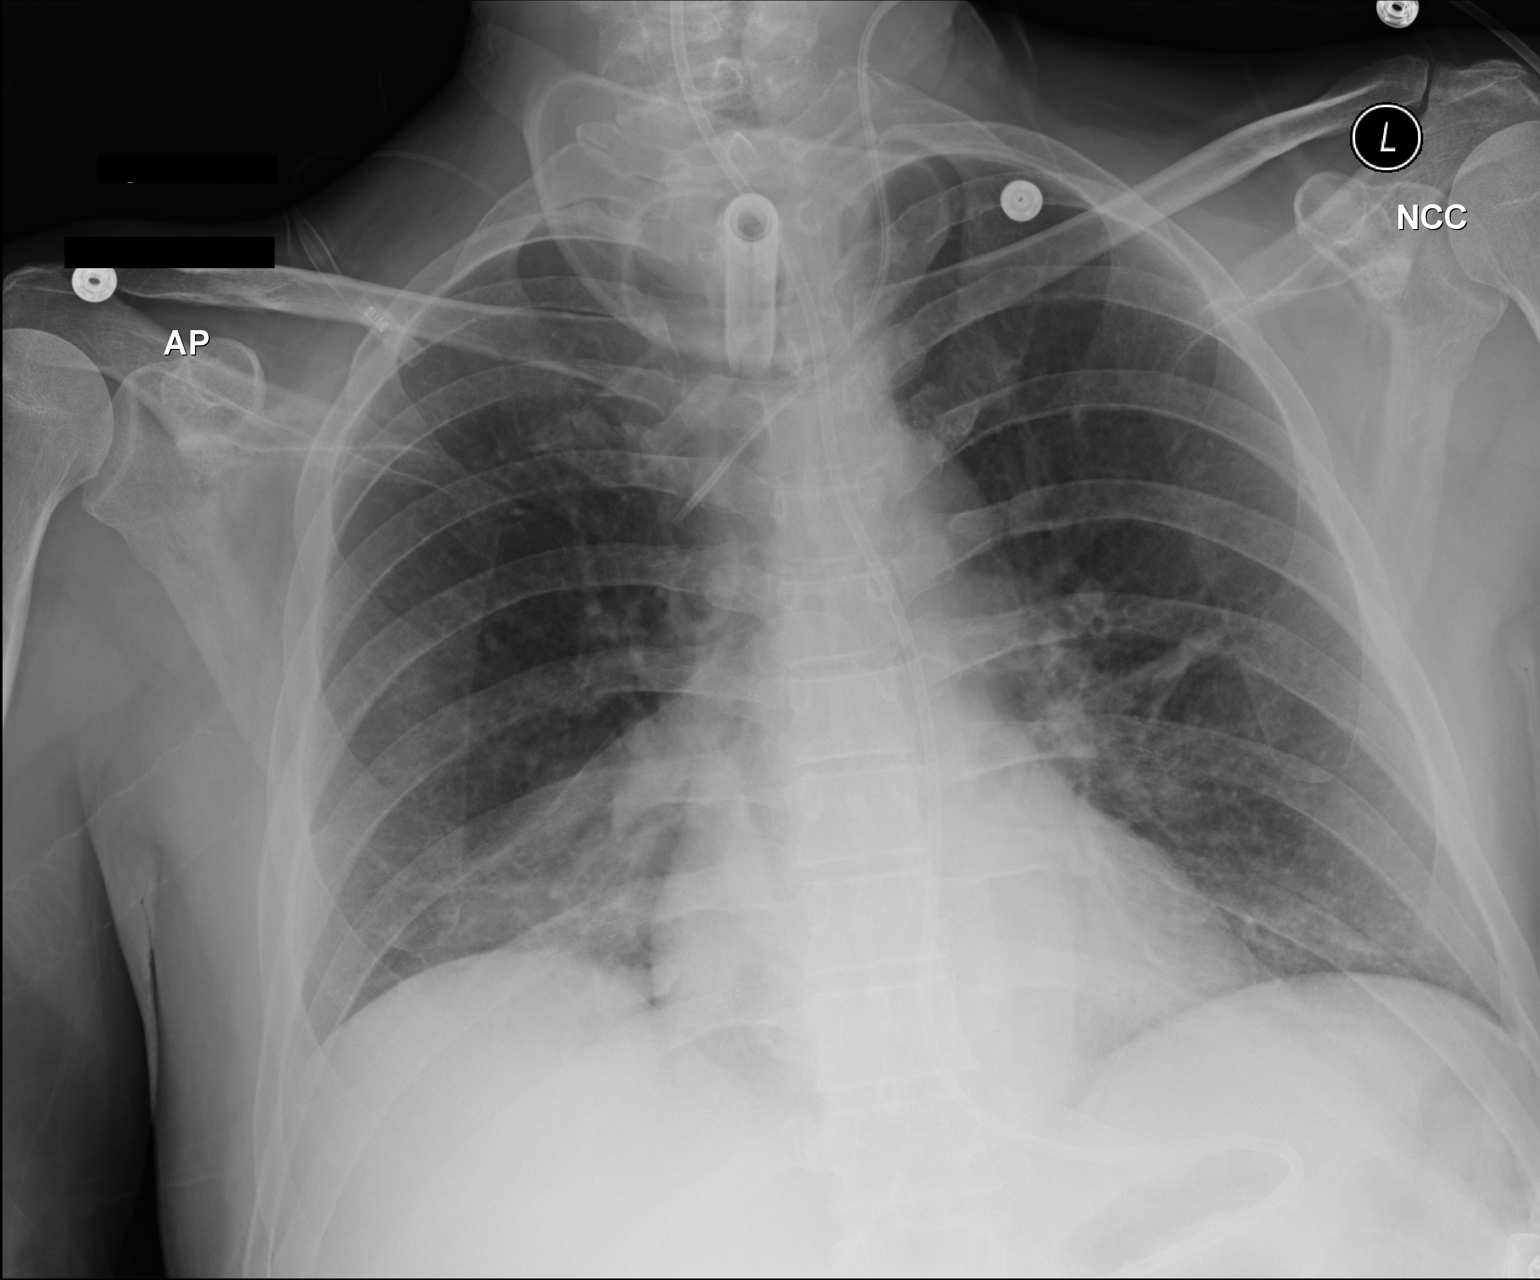

[1 of 1 positions shown; findings below may reference images not displayed]

FINDINGS: Tracheostomy tube remains in position with tip terminating
approximately 6.2 cm above the carina. There is a left-sided
internal jugular central venous catheter with tip terminating in the
proximal superior vena cava. A feeding tube is seen extending into
the abdomen, however, the tip of the feeding tube extends below the
lower margin of the image. Lung volumes are low. The continues to be
an opacity in the medial aspect of the lower right hemithorax,
favored to reflect partial collapse and consolidation of the right
lower lobe. Aeration appears slightly worsened when compared to the
recent prior study. Patchy perihilar opacities are also noted in the
left lung, particularly in the left lung base. No pleural effusions.
No evidence of pulmonary edema. Heart size is normal. Upper
mediastinal contours are within normal limits.
IMPRESSION: 1. Worsening atelectasis and/or consolidation in the right lower
lobe. Otherwise, the radiographic appearance the chest is very
similar to the prior study, as above.

## 2015-02-11 IMAGING — CR DG ABD PORTABLE 1V
1 series · 1 of 1 positions shown · non-contrast
Comparison: Abdomen series [DATE].

CLINICAL DATA: NG tube placement.

EXAM:
PORTABLE ABDOMEN - 1 VIEW

[supine ap]
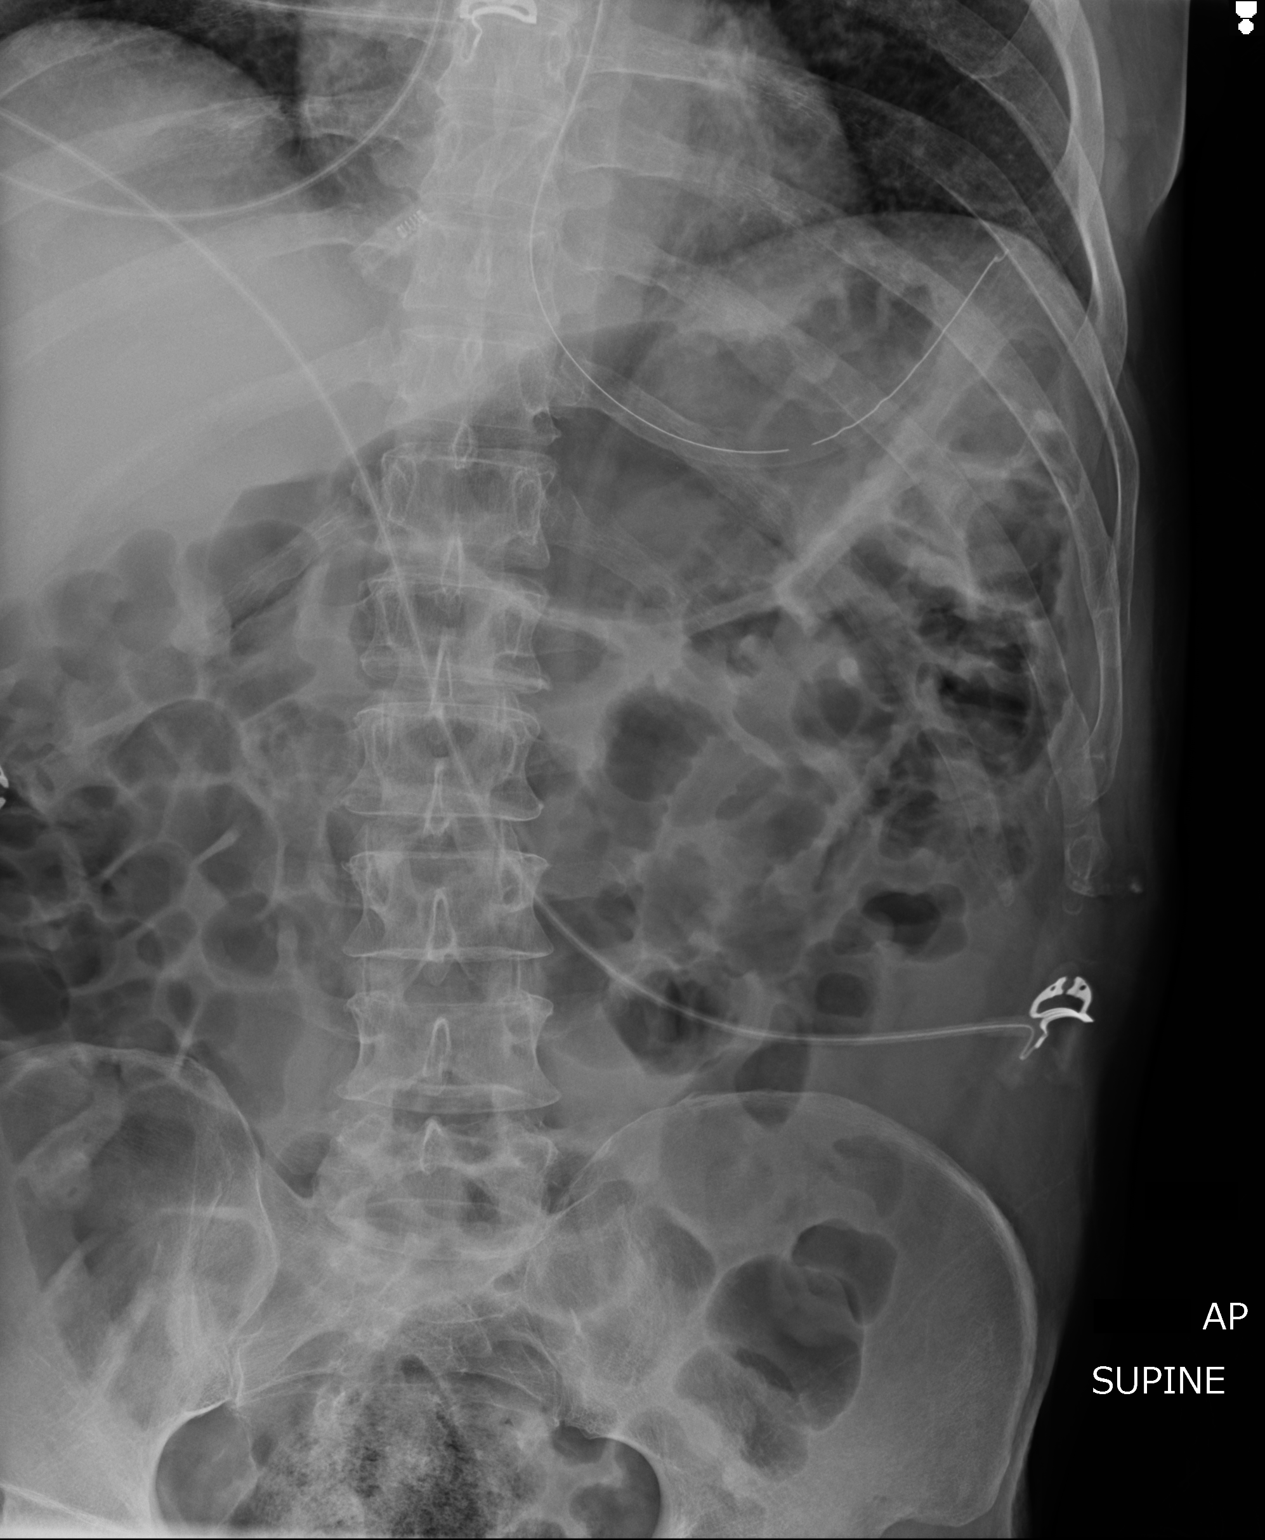

[1 of 1 positions shown; findings below may reference images not displayed]

FINDINGS: Interim removal of Dobbhoff tube and placement of NG tube. NG tube
noted projected over stomach. Dilated loops of small and large bowel
noted. These findings are most consistent with adynamic ileus. No
free air identified.
IMPRESSION: NG tube noted projected over the stomach. Slightly dilated loops of
small and large bowel noted consistent with adynamic ileus.

## 2015-02-14 IMAGING — XA IR PERC PLACEMENT GASTROSTOMY
1 series · 7 of 7 positions shown · non-contrast
Comparison: none

CLINICAL DATA: Anoxic brain injury, dysphagia with high risk for
aspiration. Gastrostomy tube required for aspiration prevention.
TECHNIQUE: Informed consent was obtained from the patient following explanation
of the procedure, risks, benefits and alternatives. The patient
understands, agrees and consents for the procedure. All questions
were addressed. A time out was performed.

[Series 1: run · 7 of 7 slices shown]
[im 1/7]
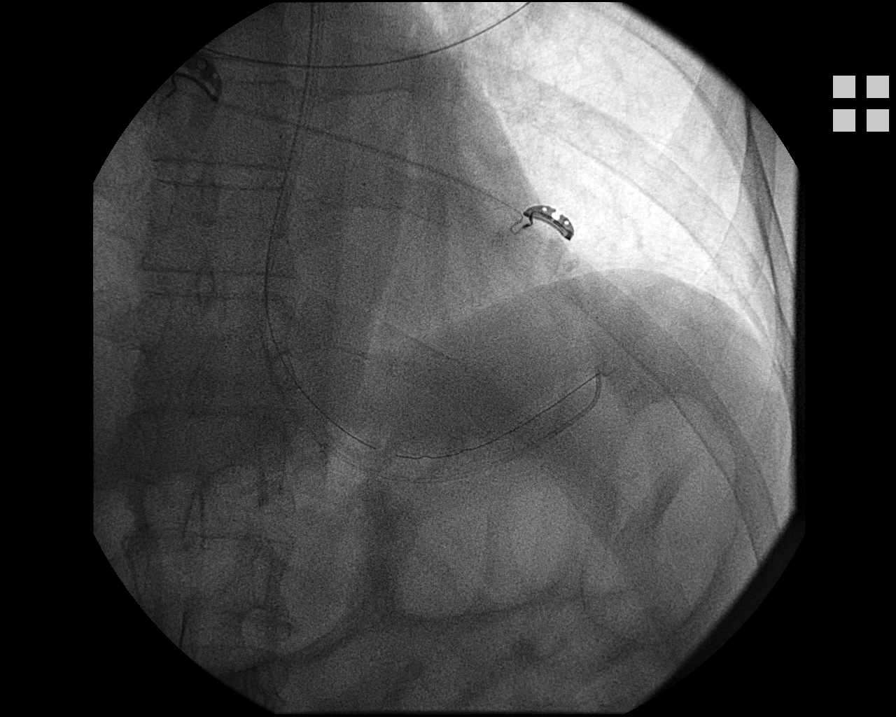
[im 2/7]
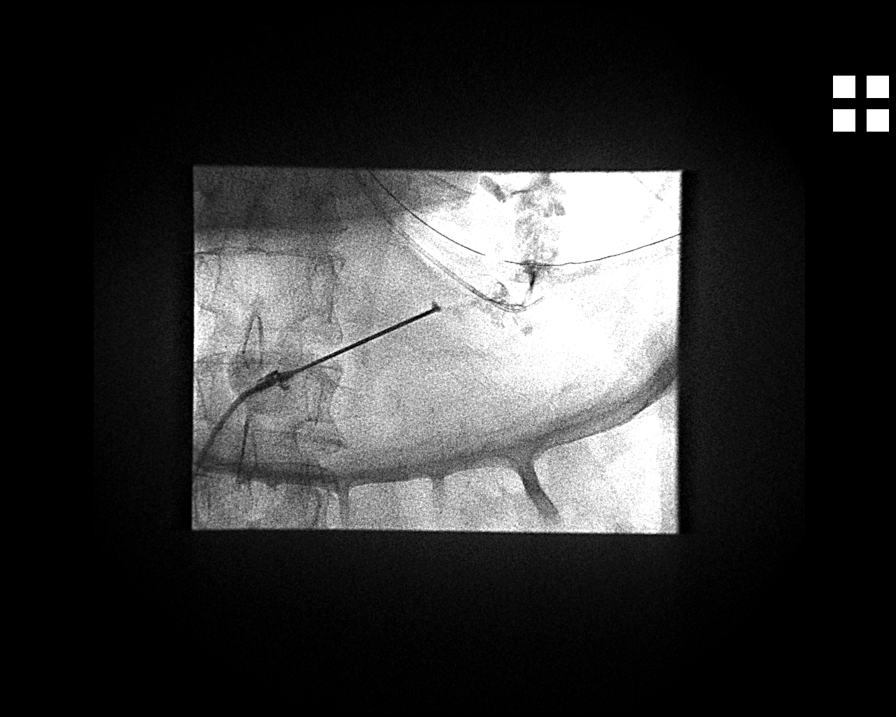
[im 3/7]
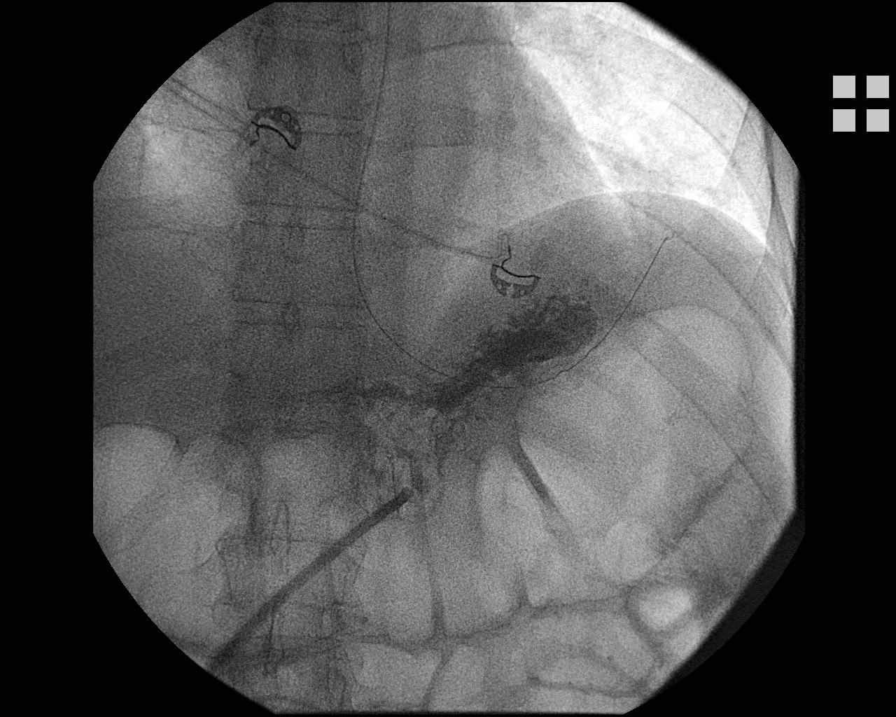
[im 4/7]
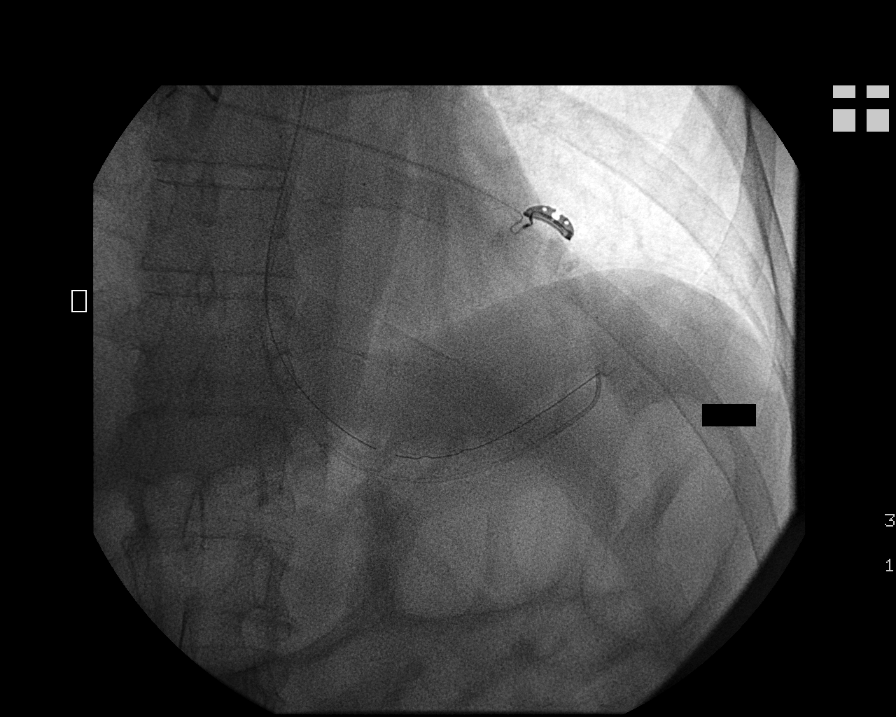
[im 5/7]
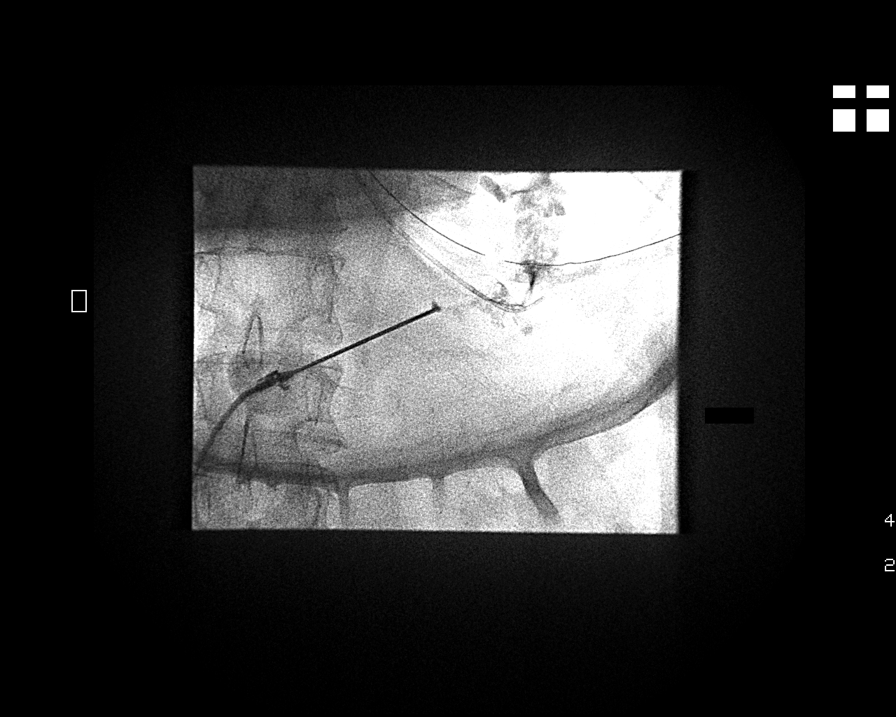
[im 6/7]
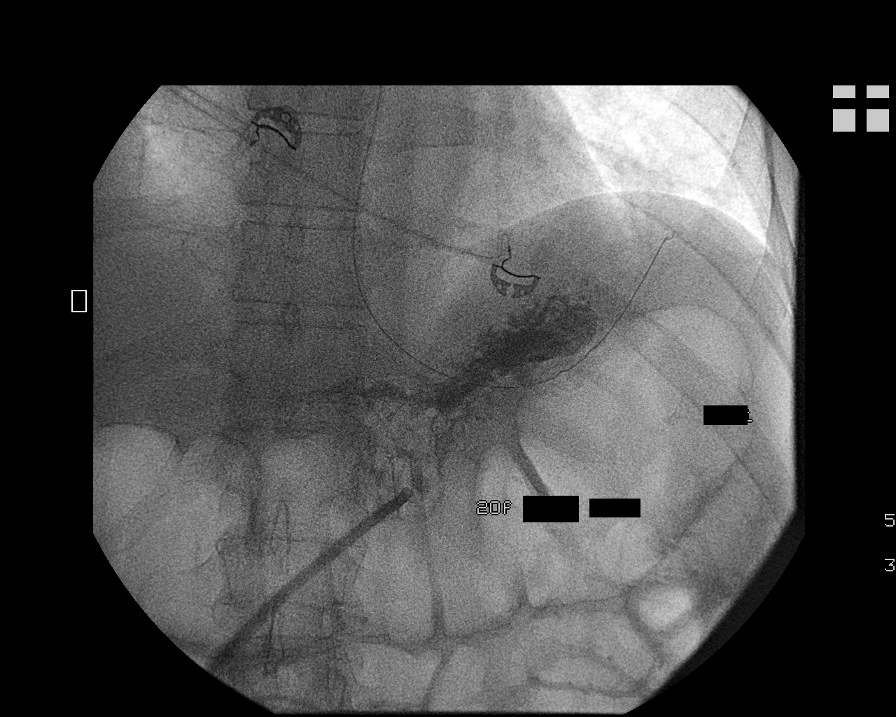
[im 7/7]
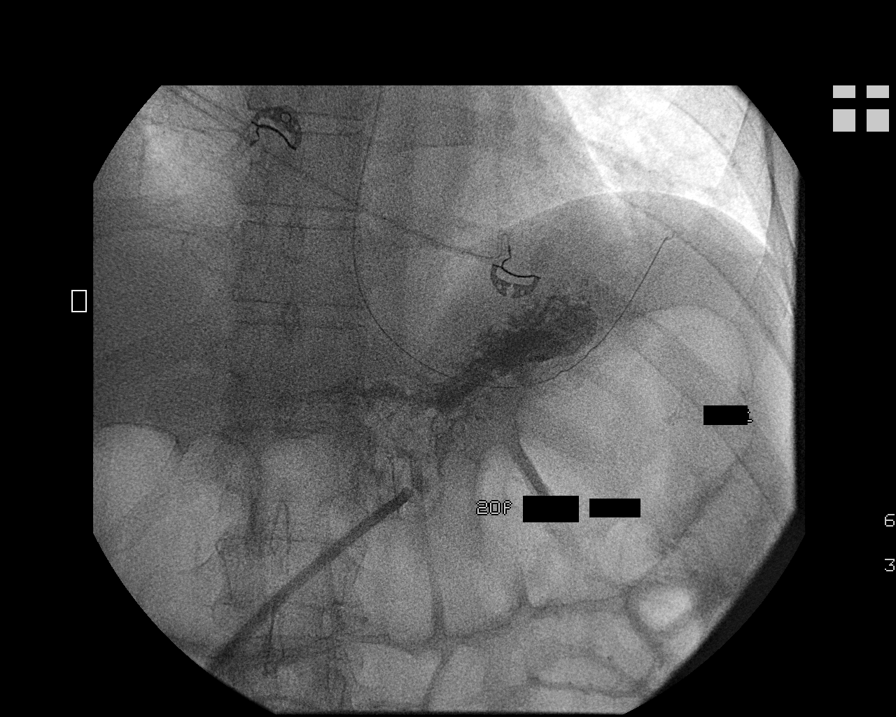

[7 of 7 positions shown; findings below may reference images not displayed]

EXAM:
PERC PLACEMENT GASTROSTOMY

Date: 07/27/2013

PROCEDURE:
1. Fluoroscopically guided placement of percutaneous pull-through
gastrostomy tube.

ANESTHESIA/SEDATION:
Moderate (conscious) sedation was used. One mg Versed, 50 mcg
Fentanyl were administered intravenously. The patient's vital signs
were monitored continuously by radiology nursing throughout the
procedure.

Sedation Time: 10 minutes

FLUOROSCOPY TIME:  48 seconds

CONTRAST:  15mL OMNIPAQUE IOHEXOL 300 MG/ML  SOLN

MEDICATIONS:
2 g Ancef was administered intravenously within 1 hour of skin
incision.
Maximal barrier sterile technique utilized including caps, mask,
sterile gowns, sterile gloves, large sterile drape, hand hygiene,
and chlorhexadine skin prep.

An angled catheter was advanced over a wire under fluoroscopic
guidance through the nose, down the esophagus and into the body of
the stomach. The stomach was then insufflated with several 100 ml of
air. Fluoroscopy confirmed location of the gastric bubble, as well
as inferior displacement of the barium stained colon. Under direct
fluoroscopic guidance, a single T-tack was placed, and the anterior
gastric wall drawn up against the anterior abdominal wall.
Percutaneous access was then obtained into the mid gastric body with
an 18 gauge sheath needle. Aspiration of air, and injection of
contrast material under fluoroscopy confirmed needle placement.

An Amplatz wire was advanced in the gastric body and the access
needle exchanged for a 9-French vascular sheath. A snare device was
advanced through the vascular sheath and an Amplatz wire advanced
through the angled catheter. The Amplatz wire was successfully
snared and this was pulled up through the esophagus and out the
mouth. A 20-Odjhie Zuniharwati tube was then connected to
the snare and pulled through the mouth, down the esophagus, into the
stomach and out to the anterior abdominal wall. Hand injection of
contrast material confirmed intragastric location. The T-tack
retention suture was then cut. The pull through peg tube was then
secured with the external bumper and capped.

The patient will be observed for several hours with the newly placed
tube on low wall suction to evaluate for any post procedure
complication. The patient tolerated the procedure well, there is no
immediate complication.
IMPRESSION: Successful placement of a 20 French pull through gastrostomy tube.

## 2015-08-05 DEATH — deceased

## 2016-02-18 IMAGING — CR DG CHEST 1V PORT
1 series · 1 of 1 positions shown · non-contrast
Comparison: None.

CLINICAL DATA: Abdominal pain and hypotension

EXAM:
PORTABLE CHEST - 1 VIEW

[AP]
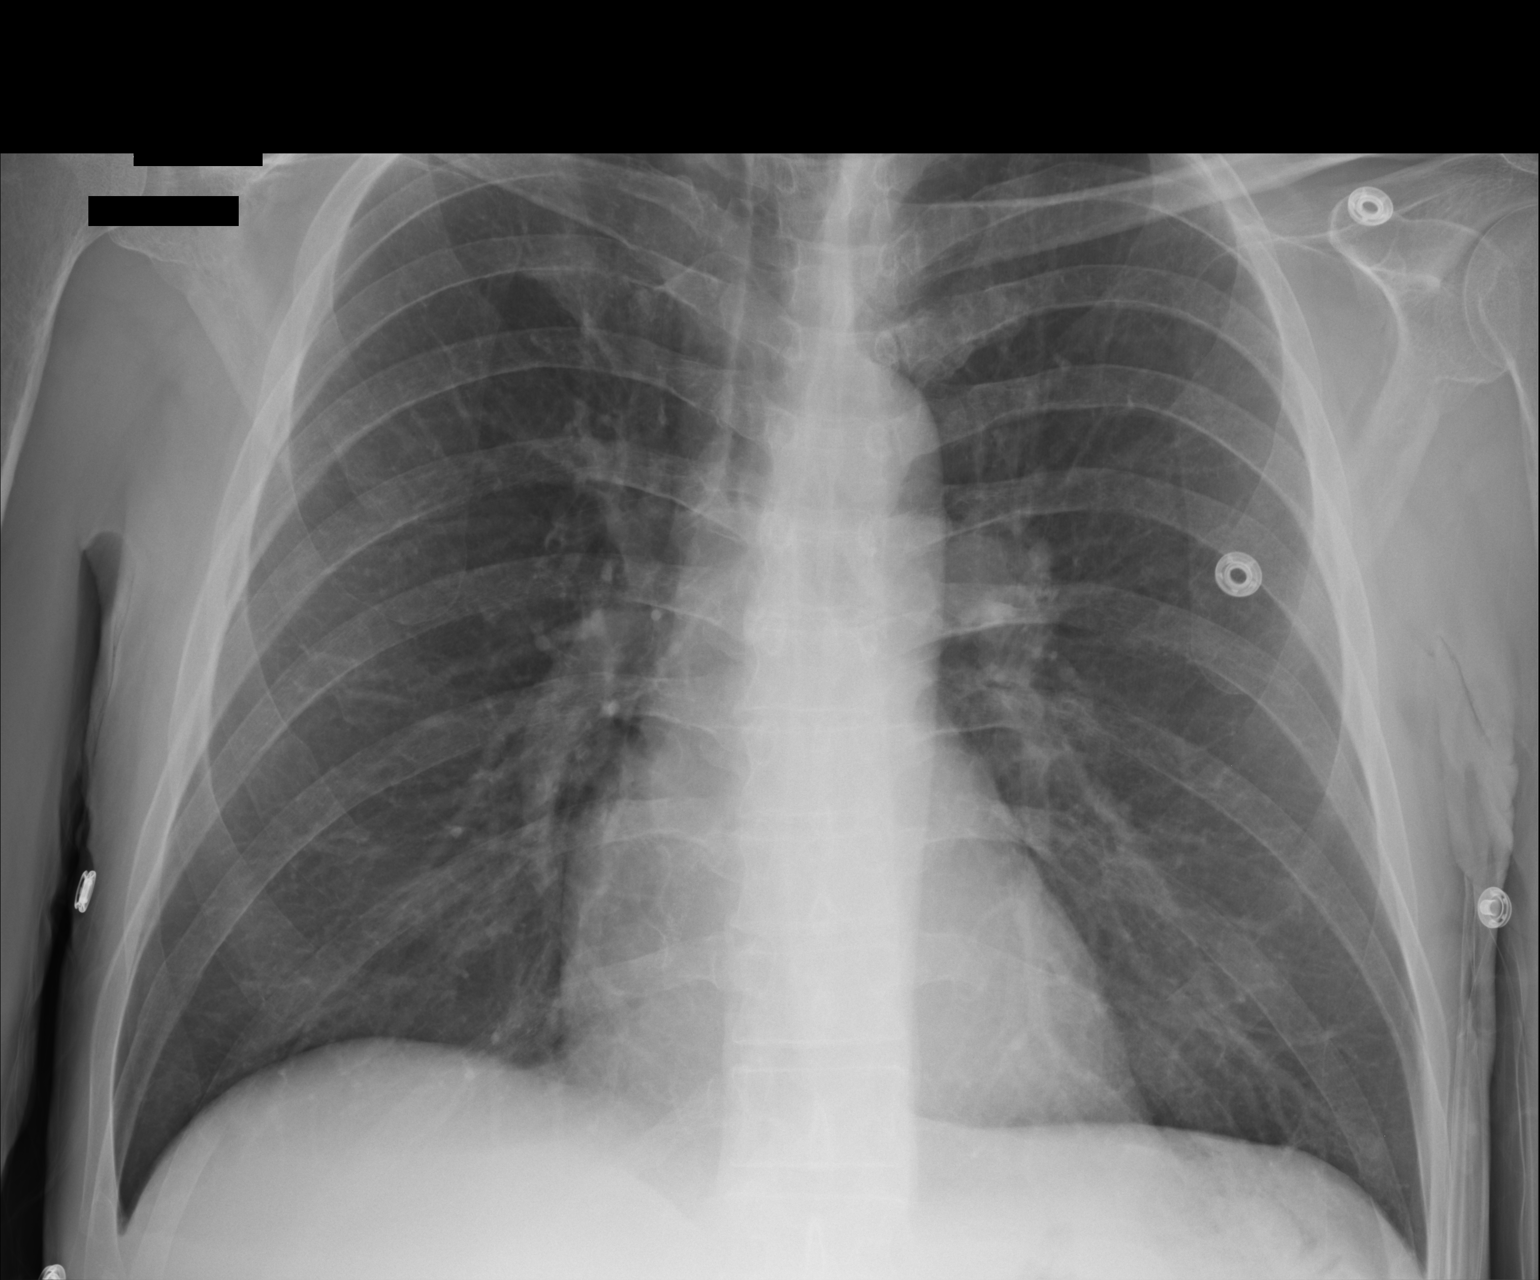

[1 of 1 positions shown; findings below may reference images not displayed]

FINDINGS: The heart size and mediastinal contours are within normal limits.
Both lungs are clear. The visualized skeletal structures are
unremarkable.
IMPRESSION: No active disease.
